# Patient Record
Sex: Female | Born: 1950 | Race: Black or African American | Hispanic: No | Smoking: Current every day smoker
Health system: Southern US, Community
[De-identification: ages and names within clinical notes are randomized; demographics above are authoritative.]

## PROBLEM LIST (undated history)

## (undated) DIAGNOSIS — I10 Essential (primary) hypertension: Secondary | ICD-10-CM

## (undated) DIAGNOSIS — E785 Hyperlipidemia, unspecified: Secondary | ICD-10-CM

## (undated) HISTORY — PX: TUBAL LIGATION: SHX77

## (undated) HISTORY — PX: TONSILLECTOMY: SUR1361

## (undated) HISTORY — PX: HERNIA REPAIR: SHX51

## (undated) HISTORY — PX: APPENDECTOMY: SHX54

## (undated) HISTORY — PX: ABDOMINAL HYSTERECTOMY: SHX81

---

## 2000-06-05 ENCOUNTER — Encounter: Admission: RE | Admit: 2000-06-05 | Discharge: 2000-06-05 | Payer: Self-pay | Admitting: Family Medicine

## 2000-06-05 ENCOUNTER — Encounter: Payer: Self-pay | Admitting: Family Medicine

## 2000-07-17 ENCOUNTER — Ambulatory Visit (HOSPITAL_BASED_OUTPATIENT_CLINIC_OR_DEPARTMENT_OTHER): Admission: RE | Admit: 2000-07-17 | Discharge: 2000-07-17 | Payer: Self-pay | Admitting: Oral Surgery

## 2000-10-07 ENCOUNTER — Emergency Department (HOSPITAL_COMMUNITY): Admission: EM | Admit: 2000-10-07 | Discharge: 2000-10-07 | Payer: Self-pay | Admitting: Emergency Medicine

## 2000-11-16 ENCOUNTER — Emergency Department (HOSPITAL_COMMUNITY): Admission: EM | Admit: 2000-11-16 | Discharge: 2000-11-17 | Payer: Self-pay | Admitting: Emergency Medicine

## 2000-11-17 ENCOUNTER — Encounter: Payer: Self-pay | Admitting: Emergency Medicine

## 2001-03-13 ENCOUNTER — Encounter: Payer: Self-pay | Admitting: Family Medicine

## 2001-03-13 ENCOUNTER — Encounter: Admission: RE | Admit: 2001-03-13 | Discharge: 2001-03-13 | Payer: Self-pay | Admitting: Family Medicine

## 2005-01-17 ENCOUNTER — Observation Stay (HOSPITAL_COMMUNITY): Admission: RE | Admit: 2005-01-17 | Discharge: 2005-01-18 | Payer: Self-pay | Admitting: General Surgery

## 2008-06-22 ENCOUNTER — Encounter: Admission: RE | Admit: 2008-06-22 | Discharge: 2008-06-22 | Payer: Self-pay | Admitting: Gastroenterology

## 2008-08-26 ENCOUNTER — Emergency Department (HOSPITAL_COMMUNITY): Admission: EM | Admit: 2008-08-26 | Discharge: 2008-08-26 | Payer: Self-pay | Admitting: Emergency Medicine

## 2008-09-27 ENCOUNTER — Emergency Department (HOSPITAL_COMMUNITY): Admission: EM | Admit: 2008-09-27 | Discharge: 2008-09-27 | Payer: Self-pay | Admitting: Emergency Medicine

## 2008-10-29 ENCOUNTER — Emergency Department (HOSPITAL_COMMUNITY): Admission: EM | Admit: 2008-10-29 | Discharge: 2008-10-29 | Payer: Self-pay | Admitting: Emergency Medicine

## 2009-01-05 ENCOUNTER — Emergency Department (HOSPITAL_COMMUNITY): Admission: EM | Admit: 2009-01-05 | Discharge: 2009-01-06 | Payer: Self-pay | Admitting: Emergency Medicine

## 2010-01-11 ENCOUNTER — Inpatient Hospital Stay (HOSPITAL_COMMUNITY): Admission: EM | Admit: 2010-01-11 | Discharge: 2010-01-13 | Payer: Self-pay | Admitting: Emergency Medicine

## 2010-10-22 LAB — BASIC METABOLIC PANEL
BUN: 10 mg/dL (ref 6–23)
CO2: 28 mEq/L (ref 19–32)
CO2: 29 mEq/L (ref 19–32)
Chloride: 103 mEq/L (ref 96–112)
Chloride: 107 mEq/L (ref 96–112)
Creatinine, Ser: 1.09 mg/dL (ref 0.4–1.2)
GFR calc Af Amer: >60 mL/min (ref >60)
Potassium: 3.4 mEq/L — ABNORMAL LOW (ref 3.5–5.1)
Potassium: 4.7 mEq/L (ref 3.5–5.1)
Sodium: 140 mEq/L (ref 135–145)

## 2010-10-22 LAB — URINALYSIS, ROUTINE W REFLEX MICROSCOPIC
Glucose, UA: NEGATIVE mg/dL
Ketones, ur: NEGATIVE mg/dL
Nitrite: NEGATIVE
Protein, ur: NEGATIVE mg/dL
Urobilinogen, UA: 1 mg/dL (ref 0.0–1.0)

## 2010-10-22 LAB — POCT I-STAT, CHEM 8
Calcium, Ion: 1.09 mmol/L — ABNORMAL LOW (ref 1.12–1.32)
Creatinine, Ser: 1 mg/dL (ref 0.4–1.2)
Hemoglobin: 16.7 g/dL — ABNORMAL HIGH (ref 12.0–15.0)
Sodium: 137 mEq/L (ref 135–145)
TCO2: 25 mmol/L (ref 0–100)

## 2010-10-22 LAB — LIPID PANEL
Cholesterol: 223 mg/dL — ABNORMAL HIGH (ref 0–200)
HDL: 48 mg/dL (ref >39)
HDL: 52 mg/dL (ref >39)
LDL Cholesterol: 159 mg/dL — ABNORMAL HIGH (ref 0–99)
LDL Cholesterol: 189 mg/dL — ABNORMAL HIGH (ref 0–99)
Total CHOL/HDL Ratio: 4.6 RATIO
Total CHOL/HDL Ratio: 4.9 RATIO
Triglycerides: 62 mg/dL (ref <150)
VLDL: 12 mg/dL (ref 0–40)
VLDL: 16 mg/dL (ref 0–40)

## 2010-10-22 LAB — CARDIAC PANEL(CRET KIN+CKTOT+MB+TROPI)
CK, MB: 0.6 ng/mL (ref 0.3–4.0)
CK, MB: 0.6 ng/mL (ref 0.3–4.0)
CK, MB: 0.6 ng/mL (ref 0.3–4.0)
Troponin I: 0.04 ng/mL (ref 0.00–0.06)
Troponin I: 0.04 ng/mL (ref 0.00–0.06)

## 2010-10-22 LAB — URINE CULTURE: Colony Count: 60000

## 2010-10-22 LAB — DIFFERENTIAL
Lymphocytes Relative: 38 % (ref 12–46)
Lymphs Abs: 2.5 10*3/uL (ref 0.7–4.0)
Monocytes Relative: 11 % (ref 3–12)
Neutrophils Relative %: 48 % (ref 43–77)

## 2010-10-22 LAB — CBC
HCT: 39.2 % (ref 36.0–46.0)
MCV: 87.3 fL (ref 78.0–100.0)
Platelets: 244 10*3/uL (ref 150–400)
RBC: 4.49 MIL/uL (ref 3.87–5.11)
WBC: 6.6 10*3/uL (ref 4.0–10.5)

## 2010-10-22 LAB — RAPID URINE DRUG SCREEN, HOSP PERFORMED
Barbiturates: NOT DETECTED
Benzodiazepines: NOT DETECTED

## 2010-10-22 LAB — CK TOTAL AND CKMB (NOT AT ARMC)
Relative Index: INVALID (ref 0.0–2.5)
Total CK: 59 U/L (ref 7–177)

## 2010-10-22 LAB — POCT CARDIAC MARKERS: Myoglobin, poc: 82.5 ng/mL (ref 12–200)

## 2010-10-22 LAB — TROPONIN I: Troponin I: 0.01 ng/mL (ref 0.00–0.06)

## 2010-10-22 LAB — PROTIME-INR: Prothrombin Time: 13.9 seconds (ref 11.6–15.2)

## 2010-11-12 LAB — CBC
HCT: 37.6 % (ref 36.0–46.0)
Hemoglobin: 12.4 g/dL (ref 12.0–15.0)
MCHC: 32.9 g/dL (ref 30.0–36.0)
MCV: 85.5 fL (ref 78.0–100.0)
RBC: 4.4 MIL/uL (ref 3.87–5.11)

## 2010-11-12 LAB — POCT CARDIAC MARKERS: Troponin i, poc: <0.05 ng/mL (ref 0.00–0.09)

## 2010-11-12 LAB — BRAIN NATRIURETIC PEPTIDE: Pro B Natriuretic peptide (BNP): 45 pg/mL (ref 0.0–100.0)

## 2010-11-12 LAB — DIFFERENTIAL
Basophils Absolute: 0 10*3/uL (ref 0.0–0.1)
Basophils Relative: 0 % (ref 0–1)
Eosinophils Absolute: 0.1 10*3/uL (ref 0.0–0.7)
Neutro Abs: 5.9 10*3/uL (ref 1.7–7.7)
Neutrophils Relative %: 69 % (ref 43–77)

## 2010-11-12 LAB — BASIC METABOLIC PANEL
CO2: 26 mEq/L (ref 19–32)
Calcium: 9 mg/dL (ref 8.4–10.5)
Chloride: 109 mEq/L (ref 96–112)
GFR calc Af Amer: >60 mL/min (ref >60)
Potassium: 3.2 mEq/L — ABNORMAL LOW (ref 3.5–5.1)
Sodium: 139 mEq/L (ref 135–145)

## 2010-11-15 LAB — POCT I-STAT, CHEM 8
BUN: 10 mg/dL (ref 6–23)
Chloride: 103 mEq/L (ref 96–112)
Potassium: 3.6 mEq/L (ref 3.5–5.1)
Sodium: 133 mEq/L — ABNORMAL LOW (ref 135–145)
TCO2: 24 mmol/L (ref 0–100)

## 2010-11-19 LAB — POCT I-STAT, CHEM 8
BUN: 8 mg/dL (ref 6–23)
Chloride: 105 mEq/L (ref 96–112)
Creatinine, Ser: 0.8 mg/dL (ref 0.4–1.2)
Hemoglobin: 13.9 g/dL (ref 12.0–15.0)
Potassium: 3.7 mEq/L (ref 3.5–5.1)
Sodium: 141 mEq/L (ref 135–145)

## 2010-11-20 LAB — URINALYSIS, ROUTINE W REFLEX MICROSCOPIC
Bilirubin Urine: NEGATIVE
Glucose, UA: NEGATIVE mg/dL
Hgb urine dipstick: NEGATIVE
Ketones, ur: NEGATIVE mg/dL
Nitrite: NEGATIVE
Specific Gravity, Urine: 1.01 (ref 1.005–1.030)
pH: 6 (ref 5.0–8.0)

## 2010-11-20 LAB — COMPREHENSIVE METABOLIC PANEL
ALT: 16 U/L (ref 0–35)
Alkaline Phosphatase: 93 U/L (ref 39–117)
CO2: 27 mEq/L (ref 19–32)
Calcium: 9.5 mg/dL (ref 8.4–10.5)
GFR calc non Af Amer: >60 mL/min (ref >60)
Glucose, Bld: 79 mg/dL (ref 70–99)
Sodium: 141 mEq/L (ref 135–145)

## 2010-11-20 LAB — DIFFERENTIAL
Basophils Absolute: 0.1 10*3/uL (ref 0.0–0.1)
Eosinophils Relative: 2 % (ref 0–5)
Neutrophils Relative %: 60 % (ref 43–77)

## 2010-11-20 LAB — CBC
Hemoglobin: 11.7 g/dL — ABNORMAL LOW (ref 12.0–15.0)
MCHC: 33.7 g/dL (ref 30.0–36.0)
RBC: 4.11 MIL/uL (ref 3.87–5.11)

## 2010-12-21 NOTE — Op Note (Signed)
Stephanie Benton, Stephanie Benton                ACCOUNT NO.:  [id number]   MEDICAL RECORD NO.:  [id number]          PATIENT TYPE:  AMB   LOCATION:  DAY                          FACILITY:  [facility]   PHYSICIAN:  Leonie Man, M.D.   DATE OF BIRTH:  [birth year]   DATE OF PROCEDURE:  01/17/2005  DATE OF DISCHARGE:                                 OPERATIVE REPORT   PREOPERATIVE DIAGNOSIS:  Incarcerated ventral hernia   POSTOPERATIVE DIAGNOSIS:  Incarcerated ventral hernia.   PROCEDURE:  Laparoscopic repair of incarcerated ventral hernia with the  mesh.   SURGEON:  Leonie Man, M.D.   ASSISTANT:  Ollen Gross. Carolynne Edouard, M.D.   ANESTHESIA:  General.   MESH USED:  Parietex dual mesh.   SPECIMENS:  None.   ESTIMATED BLOOD LOSS:  Minimal.   COMPLICATIONS:  None apparent. The patient returned to the ICU in good  condition.   HISTORY:  Note, the patient is a morbidly obese 60 year old female with a  large incarcerated ventral hernia in the epigastrium. Her previous  operations have been total abdominal hysterectomy, tubal ligation and  appendix. She comes to the operating room now because of persistent pain in  this tightly incarcerated hernia. She understands the risks and potential  benefits of surgery and gives consent.   DESCRIPTION OF PROCEDURE:  The patient is positioned supinely after the  induction of general anesthesia. The abdomen is prepped and draped to be  included in the sterile operative field. Open laparoscopy is carried out in  the midline in the lower abdomen with insertion of a Hassan cannula and the  insufflation of the peritoneal cavity to 14 mmHg with carbon dioxide.  Lateral ports were placed in the flanks using 5 mm trocars were inserted  under direct vision. A large incarcerated mass of omentum was then carefully  dissected free from the hernial defect using both electrocautery and the  harmonic scalpel. The margins of the defect were outlined and mesh extending  to  approximately 3 cm in excess of the of the margins was fashioned and  placed into the abdomen. Sutures through the mesh were brought up to the  abdominal wall through multiple small incisions around the defect and these  were sutured in place. The screw tacker was then used to secure the mesh to  the abdominal wall in two circular rows. The repair was noted to be intact.  Pneumoperitoneum was deflated after the trocars were removed under direct  vision. All areas of dissection were checked for hemostasis and noted to be  dry. The abdominal incisions was closed with  #0 Vicryl and 4-0 Monocryl. The other incisions were closed with staples.  Dressings were applied, anesthetic reversed and the patient removed from the  operating room to the recovery room in stable condition. She tolerated the  procedure well.       PB/MEDQ  D:  01/17/2005  T:  01/17/2005  Job:  [id number]   cc:   Renaye Rakers, M.D.  [phone] [address]  [place]  [place] [address]  Fax: [phone]

## 2011-06-27 IMAGING — CR DG CHEST 2V
2 series · 2 of 2 positions shown · non-contrast
Comparison: 01/05/2009

CLINICAL DATA: Chest pain.

CHEST - 2 VIEW

[w chest pa]
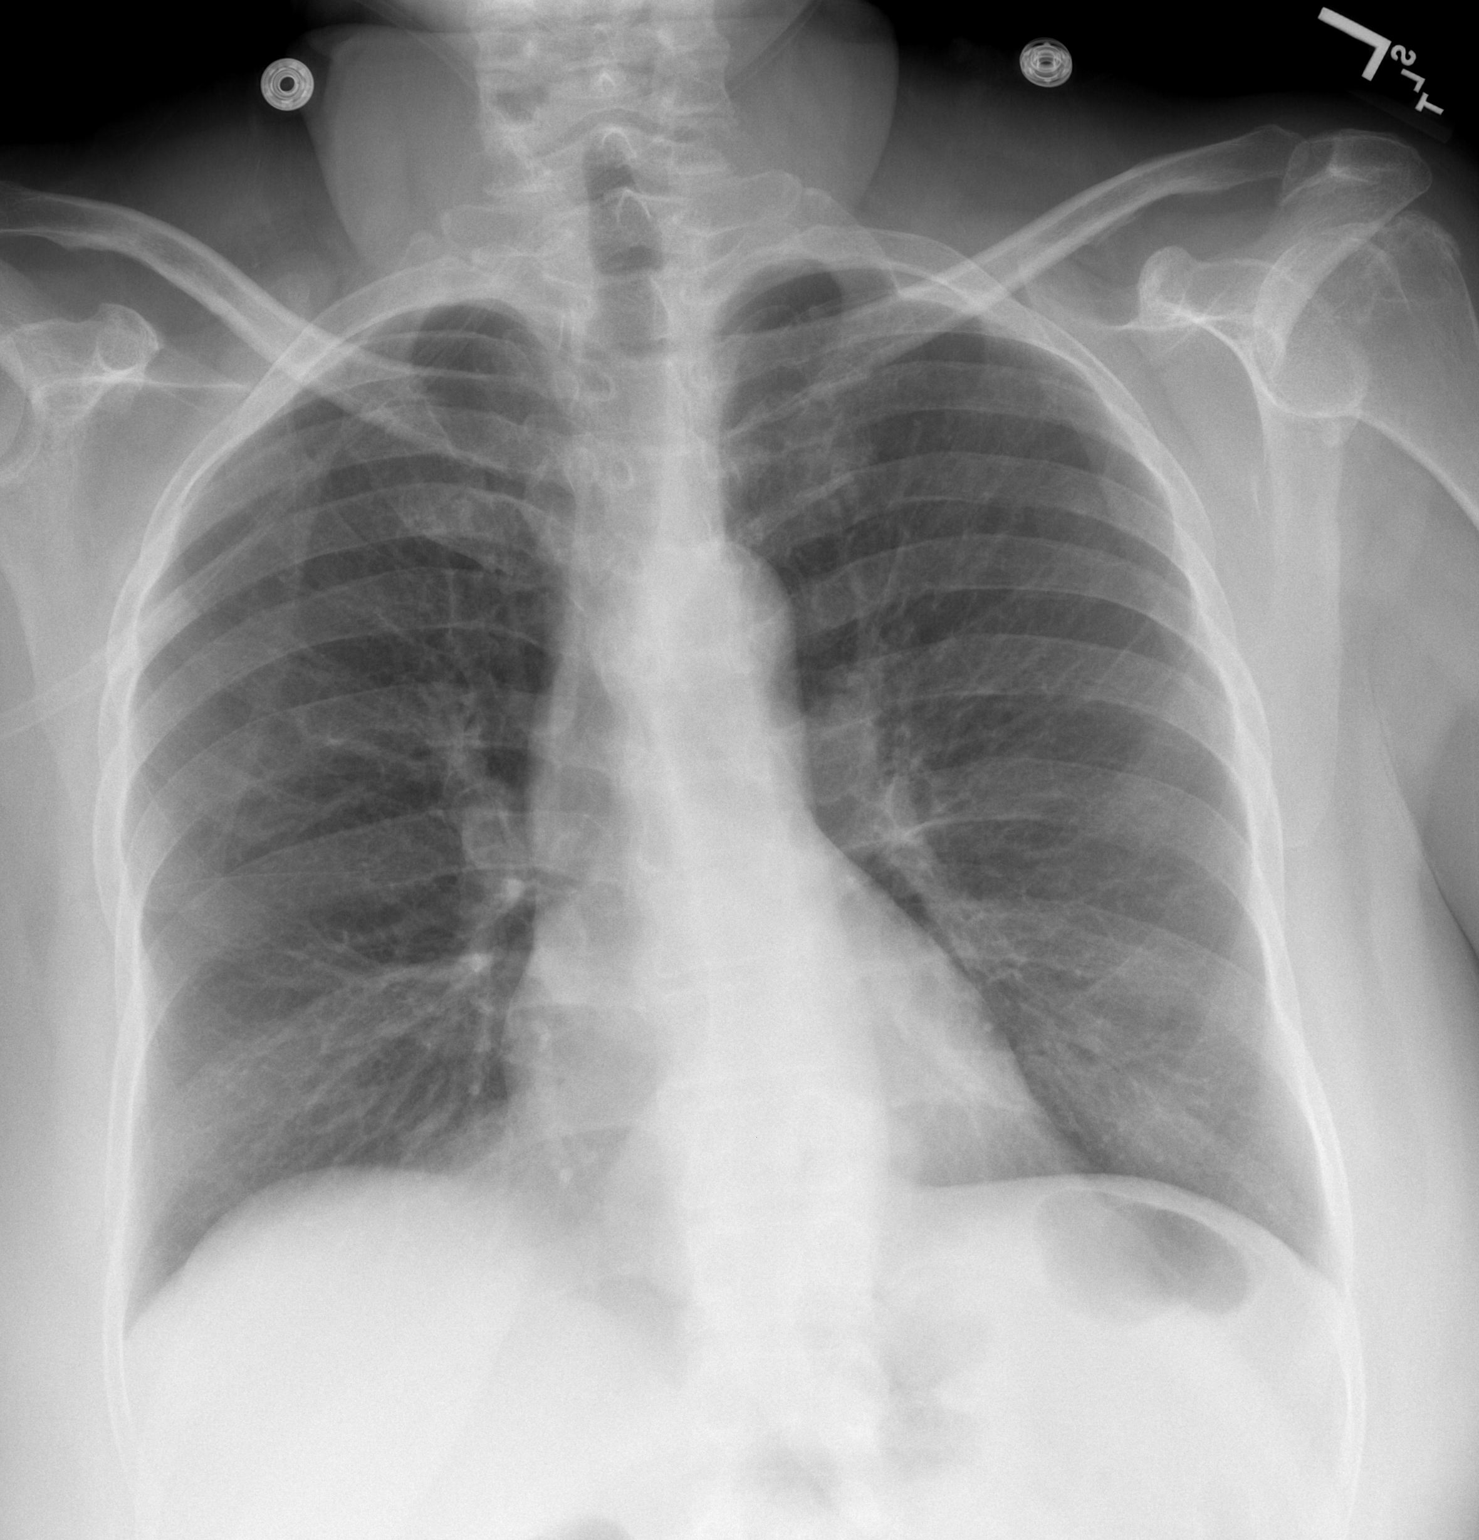

[w chest lat]
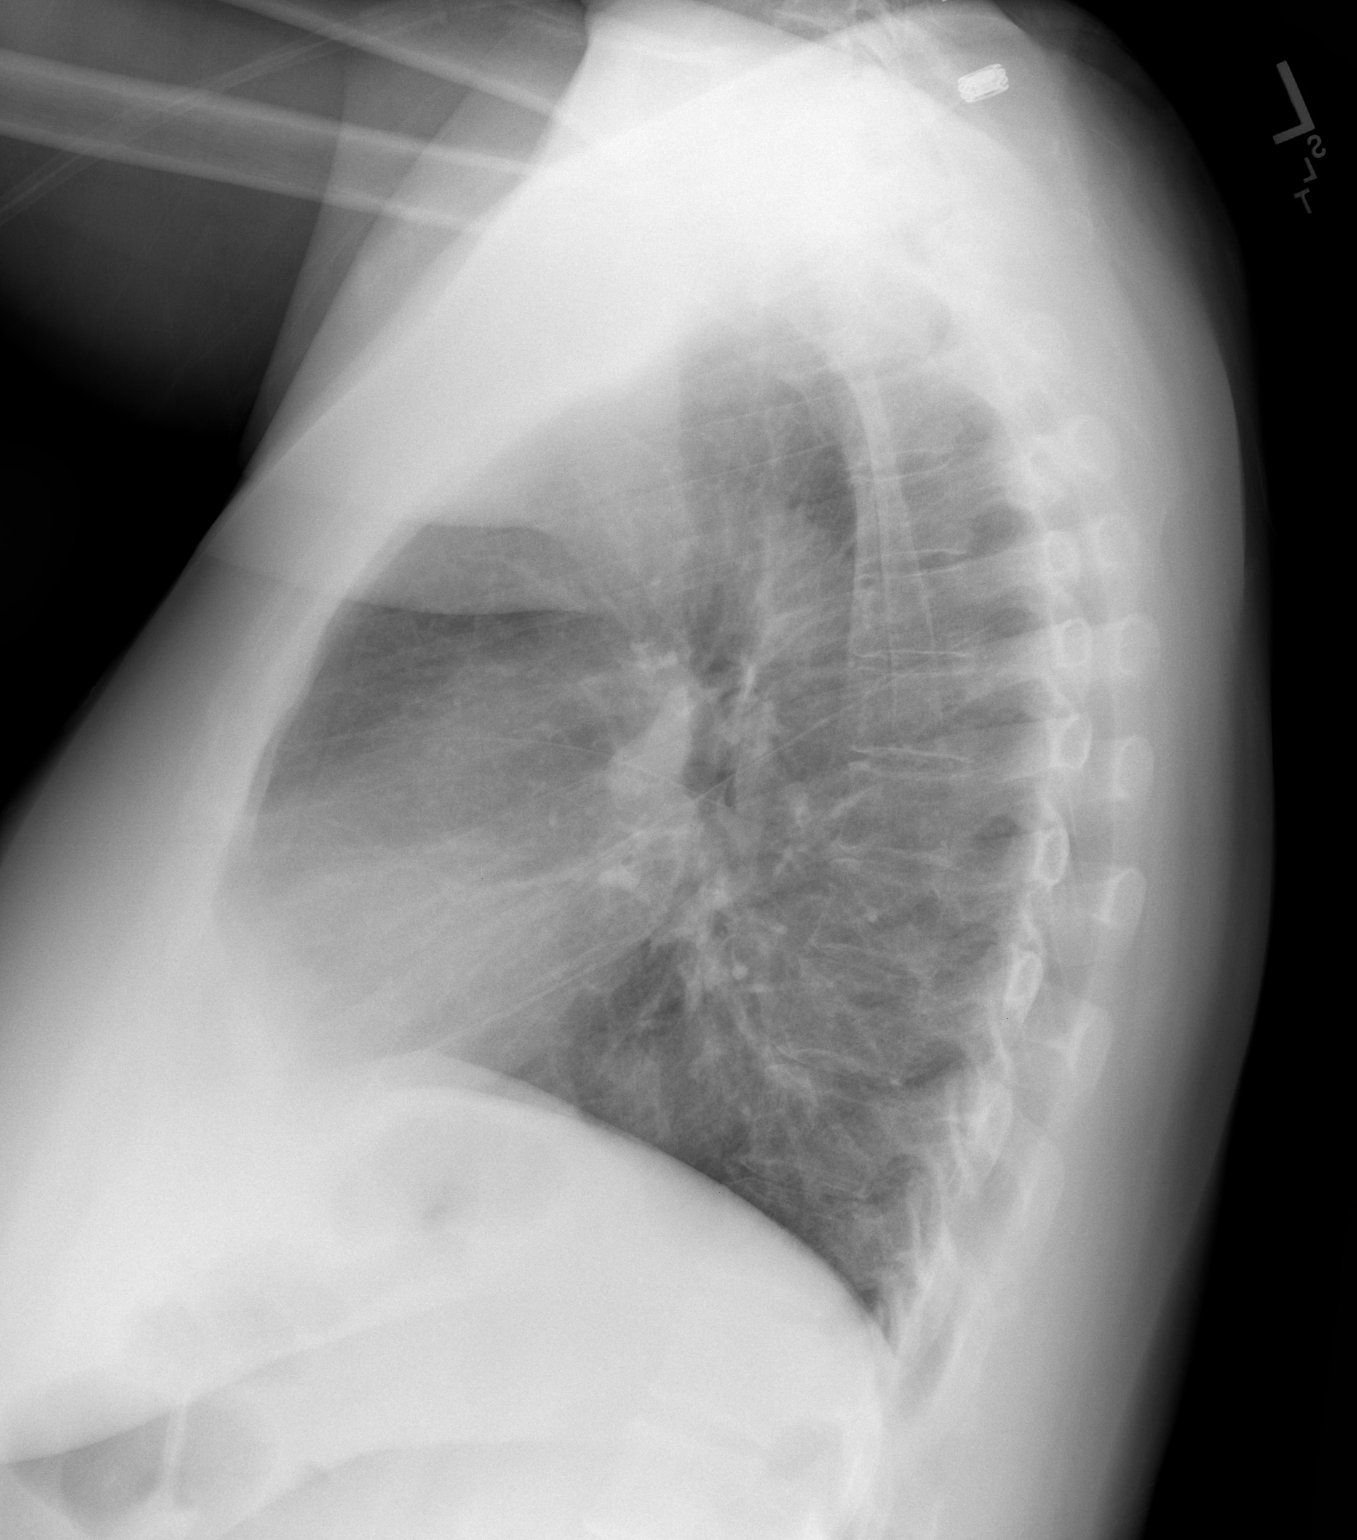

[2 of 2 positions shown; findings below may reference images not displayed]

FINDINGS: The cardiac silhouette, mediastinal and hilar contours
are within normal limits.  The lungs are clear.  There is a small
hiatal hernia.  The bony thorax is intact.
IMPRESSION: No acute cardiopulmonary findings.

## 2013-05-18 ENCOUNTER — Encounter (HOSPITAL_COMMUNITY): Payer: Self-pay | Admitting: Emergency Medicine

## 2013-05-18 ENCOUNTER — Emergency Department (HOSPITAL_COMMUNITY): Payer: Medicare Other

## 2013-05-18 ENCOUNTER — Emergency Department (HOSPITAL_COMMUNITY)
Admission: EM | Admit: 2013-05-18 | Discharge: 2013-05-18 | Disposition: A | Discharge status: Home / Self Care | Payer: Medicare Other | Attending: Emergency Medicine | Admitting: Emergency Medicine

## 2013-05-18 DIAGNOSIS — Z862 Personal history of diseases of the blood and blood-forming organs and certain disorders involving the immune mechanism: Secondary | ICD-10-CM | POA: Insufficient documentation

## 2013-05-18 DIAGNOSIS — J029 Acute pharyngitis, unspecified: Secondary | ICD-10-CM | POA: Insufficient documentation

## 2013-05-18 DIAGNOSIS — I10 Essential (primary) hypertension: Secondary | ICD-10-CM | POA: Insufficient documentation

## 2013-05-18 DIAGNOSIS — F17200 Nicotine dependence, unspecified, uncomplicated: Secondary | ICD-10-CM | POA: Insufficient documentation

## 2013-05-18 DIAGNOSIS — Z880 Allergy status to penicillin: Secondary | ICD-10-CM | POA: Insufficient documentation

## 2013-05-18 DIAGNOSIS — Z9089 Acquired absence of other organs: Secondary | ICD-10-CM | POA: Insufficient documentation

## 2013-05-18 DIAGNOSIS — Z8639 Personal history of other endocrine, nutritional and metabolic disease: Secondary | ICD-10-CM | POA: Insufficient documentation

## 2013-05-18 DIAGNOSIS — J441 Chronic obstructive pulmonary disease with (acute) exacerbation: Secondary | ICD-10-CM | POA: Diagnosis present

## 2013-05-18 DIAGNOSIS — R059 Cough, unspecified: Secondary | ICD-10-CM | POA: Insufficient documentation

## 2013-05-18 DIAGNOSIS — R05 Cough: Secondary | ICD-10-CM | POA: Insufficient documentation

## 2013-05-18 HISTORY — DX: Essential (primary) hypertension: I10

## 2013-05-18 HISTORY — DX: Hyperlipidemia, unspecified: E78.5

## 2013-05-18 MED ORDER — AZITHROMYCIN 250 MG PO TABS
500.0000 mg | ORAL_TABLET | Freq: Once | ORAL | Status: AC
  Administered 2013-05-18: 500 mg via ORAL
  Filled 2013-05-18: qty 2

## 2013-05-18 MED ORDER — ALBUTEROL SULFATE HFA 108 (90 BASE) MCG/ACT IN AERS
6.0000 | INHALATION_SPRAY | Freq: Once | RESPIRATORY_TRACT | Status: AC
  Administered 2013-05-18: 6 via RESPIRATORY_TRACT
  Filled 2013-05-18: qty 6.7

## 2013-05-18 MED ORDER — PREDNISONE 20 MG PO TABS
60.0000 mg | ORAL_TABLET | Freq: Once | ORAL | Status: AC
  Administered 2013-05-18: 60 mg via ORAL
  Filled 2013-05-18: qty 3

## 2013-05-18 MED ORDER — AZITHROMYCIN 250 MG PO TABS: ORAL_TABLET | ORAL | Status: DC

## 2013-05-18 MED ORDER — ALBUTEROL SULFATE (5 MG/ML) 0.5% IN NEBU
5.0000 mg | INHALATION_SOLUTION | Freq: Once | RESPIRATORY_TRACT | Status: AC
  Administered 2013-05-18: 5 mg via RESPIRATORY_TRACT
  Filled 2013-05-18: qty 1

## 2013-05-18 MED ORDER — GUAIFENESIN ER 600 MG PO TB12: 600.0000 mg | ORAL_TABLET | Freq: Two times a day (BID) | ORAL | Status: DC

## 2013-05-18 MED ORDER — PREDNISONE 50 MG PO TABS: ORAL_TABLET | ORAL | Status: DC

## 2013-05-18 MED ORDER — ALBUTEROL SULFATE HFA 108 (90 BASE) MCG/ACT IN AERS: 1.0000 | INHALATION_SPRAY | RESPIRATORY_TRACT | Status: DC | PRN

## 2013-05-18 NOTE — ED Notes (Signed)
Made triage RN aware of pt sats with vitals recheck.

## 2013-05-18 NOTE — ED Provider Notes (Signed)
CSN: [id number]     Arrival date & time 05/18/13  1751 History   First MD Initiated Contact with Patient 05/18/13 2131     Chief Complaint  Patient presents with  . Shortness of Breath  . Cough   (Consider location/radiation/quality/duration/timing/severity/associated sxs/prior Treatment) Patient is a 62 y.o. female presenting with shortness of breath and cough. The history is provided by the patient.  Shortness of Breath Severity:  Mild Onset quality:  Gradual Duration:  3 days Timing:  Constant Progression:  Unchanged Chronicity:  New Context: URI   Relieved by:  Nothing Worsened by:  Nothing tried Ineffective treatments:  None tried Associated symptoms: cough and sore throat   Associated symptoms: no abdominal pain, no chest pain, no fever, no headaches, no neck pain and no vomiting   Cough Associated symptoms: shortness of breath and sore throat   Associated symptoms: no chest pain, no fever and no headaches     Past Medical History  Diagnosis Date  . Hypertension   . Hyperlipidemia    Past Surgical History  Procedure Laterality Date  . Tonsillectomy    . Appendectomy    . Tubal ligation    . Abdominal hysterectomy    . Hernia repair     Family History  Problem Relation Age of Onset  . Heart failure Mother   . Cancer Father   . Cancer Sister    History  Substance Use Topics  . Smoking status: Current Every Day Smoker -- 0.75 packs/day    Types: Cigarettes  . Smokeless tobacco: Never Used  . Alcohol Use: No   OB History   Grav Para Term Preterm Abortions TAB SAB Ect Mult Living                 Review of Systems  Constitutional: Negative for fever and fatigue.  HENT: Positive for sore throat. Negative for congestion and drooling.   Eyes: Negative for pain.  Respiratory: Positive for cough and shortness of breath.   Cardiovascular: Negative for chest pain.  Gastrointestinal: Negative for nausea, vomiting, abdominal pain and diarrhea.  Genitourinary:  Negative for dysuria and hematuria.  Musculoskeletal: Negative for back pain, gait problem and neck pain.  Skin: Negative for color change.  Neurological: Negative for dizziness and headaches.  Hematological: Negative for adenopathy.  Psychiatric/Behavioral: Negative for behavioral problems.  All other systems reviewed and are negative.    Allergies  Codeine; Nsaids; Penicillins; and Sulfa antibiotics  Home Medications   Current Outpatient Rx  Name  Route  Sig  Dispense  Refill  . DEXILANT 60 MG capsule   Oral   Take 60 mg by mouth every evening.          Marland Kitchen LYRICA 100 MG capsule   Oral   Take 100 mg by mouth 2 (two) times daily.          Marland Kitchen PREMARIN 0.625 MG tablet   Oral   Take 0.625 mg by mouth every evening.          . risperiDONE (RISPERDAL) 0.5 MG tablet   Oral   Take 0.5 mg by mouth every evening.          . sertraline (ZOLOFT) 100 MG tablet   Oral   Take 50 mg by mouth every evening.          . traMADol (ULTRAM-ER) 100 MG 24 hr tablet   Oral   Take 100 mg by mouth daily.          Marland Kitchen  valsartan (DIOVAN) 160 MG tablet   Oral   Take 80 mg by mouth every evening.           BP 159/84  Pulse 65  Temp(Src) 99.2 F (37.3 C) (Oral)  Resp 20  Ht 5\' 5"  (1.651 m)  Wt 211 lb (95.709 kg)  BMI 35.11 kg/m2  SpO2 89% Physical Exam  Nursing note and vitals reviewed. Constitutional: She is oriented to person, place, and time. She appears well-developed and well-nourished.  HENT:  Head: Normocephalic.  Mouth/Throat: Oropharynx is clear and moist. No oropharyngeal exudate.  Eyes: Conjunctivae and EOM are normal. Pupils are equal, round, and reactive to light.  Neck: Normal range of motion. Neck supple.  Cardiovascular: Normal rate, regular rhythm, normal heart sounds and intact distal pulses.  Exam reveals no gallop and no friction rub.   No murmur heard. Pulmonary/Chest: Effort normal. No respiratory distress. She has wheezes (mild wheezing in lung  bases).  Abdominal: Soft. Bowel sounds are normal. There is no tenderness. There is no rebound and no guarding.  Musculoskeletal: Normal range of motion. She exhibits no edema and no tenderness.  Neurological: She is alert and oriented to person, place, and time.  Skin: Skin is warm and dry.  Psychiatric: She has a normal mood and affect. Her behavior is normal.    ED Course  Procedures (including critical care time) Labs Review Labs Reviewed - No data to display Imaging Review Dg Chest 2 View  05/18/2013   CLINICAL DATA:  Shortness of breath and cough  EXAM: CHEST  2 VIEW  COMPARISON:  01/11/2010  FINDINGS: The heart size and vascular pattern are normal. No pleural effusions identified. Mildly increased markings in the medial right lung base are identified which were not present previously. This is most likely due to atelectasis early findings of pneumonia cannot be excluded.  IMPRESSION: Mild right lower lobe opacity most likely due to atelectasis, although early changes of pneumonia are not excluded.   Electronically Signed   By: Esperanza Heir M.D.   On: 05/18/2013 18:30     MDM   1. COPD exacerbation    9:57 PM 62 y.o. female who presents with nonproductive cough and mild shortness of breath for the last 2-3 days. She denies any fevers at home. She is afebrile and vital signs are unremarkable here. She has mild wheezing bilaterally on exam. Chest x-ray showing mild right lower lobe opacity suspected to be due to atelectasis but cannot rule out early changes of pneumonia. Will treat for mild COPD exacerbation and pneumonia. Will give albuterol treatment, prednisone, azithromycin.     I have discussed the diagnosis/risks/treatment options with the patient and believe the pt to be eligible for discharge home to follow-up with pcp as needed. We also discussed returning to the ED immediately if new or worsening sx occur. We discussed the sx which are most concerning (e.g., worsening sob,  fever, cp) that necessitate immediate return. Any new prescriptions provided to the patient are listed below.  Discharge Medication List as of 05/18/2013 10:57 PM    START taking these medications   Details  albuterol (PROVENTIL HFA;VENTOLIN HFA) 108 (90 BASE) MCG/ACT inhaler Inhale 1-2 puffs into the lungs every 4 (four) hours as needed for wheezing or shortness of breath., Starting 05/18/2013, Until Discontinued, Print    azithromycin (ZITHROMAX) 250 MG tablet Take 1 tablet by mouth daily., Print    guaiFENesin (MUCINEX) 600 MG 12 hr tablet Take 1 tablet (600 mg total) by  mouth 2 (two) times daily., Starting 05/18/2013, Until Discontinued, Print    predniSONE (DELTASONE) 50 MG tablet Take 1 tablet by mouth daily for 4 days, Print           Junius Argyle, MD 05/19/13 1236

## 2013-05-18 NOTE — ED Notes (Signed)
Patient reports that she began having a sore throat 3 days ago and now has SOB and a non productive cough.

## 2014-01-31 ENCOUNTER — Encounter (HOSPITAL_COMMUNITY): Payer: Self-pay | Admitting: Emergency Medicine

## 2014-01-31 ENCOUNTER — Emergency Department (HOSPITAL_COMMUNITY)
Admission: EM | Admit: 2014-01-31 | Discharge: 2014-01-31 | Disposition: A | Discharge status: Home / Self Care | Payer: Medicare Other | Attending: Emergency Medicine | Admitting: Emergency Medicine

## 2014-01-31 DIAGNOSIS — F17200 Nicotine dependence, unspecified, uncomplicated: Secondary | ICD-10-CM | POA: Insufficient documentation

## 2014-01-31 DIAGNOSIS — Z862 Personal history of diseases of the blood and blood-forming organs and certain disorders involving the immune mechanism: Secondary | ICD-10-CM | POA: Insufficient documentation

## 2014-01-31 DIAGNOSIS — R110 Nausea: Secondary | ICD-10-CM | POA: Insufficient documentation

## 2014-01-31 DIAGNOSIS — I10 Essential (primary) hypertension: Secondary | ICD-10-CM | POA: Insufficient documentation

## 2014-01-31 DIAGNOSIS — Z8639 Personal history of other endocrine, nutritional and metabolic disease: Secondary | ICD-10-CM | POA: Insufficient documentation

## 2014-01-31 DIAGNOSIS — Z79899 Other long term (current) drug therapy: Secondary | ICD-10-CM | POA: Insufficient documentation

## 2014-01-31 DIAGNOSIS — Z880 Allergy status to penicillin: Secondary | ICD-10-CM | POA: Insufficient documentation

## 2014-01-31 DIAGNOSIS — R51 Headache: Secondary | ICD-10-CM | POA: Insufficient documentation

## 2014-01-31 DIAGNOSIS — R519 Headache, unspecified: Secondary | ICD-10-CM

## 2014-01-31 MED ORDER — METOCLOPRAMIDE HCL 5 MG/ML IJ SOLN
5.0000 mg | Freq: Once | INTRAMUSCULAR | Status: AC
  Administered 2014-01-31: 5 mg via INTRAVENOUS
  Filled 2014-01-31: qty 2

## 2014-01-31 MED ORDER — SODIUM CHLORIDE 0.9 % IV BOLUS (SEPSIS)
1000.0000 mL | INTRAVENOUS | Status: AC
  Administered 2014-01-31: 1000 mL via INTRAVENOUS

## 2014-01-31 MED ORDER — BUTALBITAL-APAP-CAFFEINE 50-325-40 MG PO TABS: 1.0000 | ORAL_TABLET | Freq: Four times a day (QID) | ORAL | Status: AC | PRN

## 2014-01-31 MED ORDER — DEXAMETHASONE SODIUM PHOSPHATE 10 MG/ML IJ SOLN
10.0000 mg | Freq: Once | INTRAMUSCULAR | Status: AC
  Administered 2014-01-31: 10 mg via INTRAVENOUS
  Filled 2014-01-31: qty 1

## 2014-01-31 MED ORDER — DIPHENHYDRAMINE HCL 50 MG/ML IJ SOLN
12.5000 mg | Freq: Once | INTRAMUSCULAR | Status: AC
  Administered 2014-01-31: 12.5 mg via INTRAVENOUS
  Filled 2014-01-31: qty 1

## 2014-01-31 NOTE — Discharge Instructions (Signed)

## 2014-01-31 NOTE — ED Notes (Signed)
MD at bedside. 

## 2014-01-31 NOTE — ED Provider Notes (Signed)
CSN: [id number]     Arrival date & time 01/31/14  1402 History   First MD Initiated Contact with Patient 01/31/14 1653     Chief Complaint  Patient presents with  . Headache     (Consider location/radiation/quality/duration/timing/severity/associated sxs/prior Treatment) Patient is a 63 y.o. female presenting with headaches. The history is provided by the patient. No language interpreter was used.  Headache Location: vertex. Quality: throbbing. Radiates to:  Does not radiate Severity currently:  9/10 Onset quality:  Gradual Duration:  5 days Timing:  Constant Progression:  Worsening Chronicity:  New Similar to prior headaches: no   Relieved by:  Nothing Ineffective treatments:  NSAIDs Associated symptoms: nausea   Associated symptoms: no fever, no neck stiffness, no photophobia and no vomiting     Past Medical History  Diagnosis Date  . Hypertension   . Hyperlipidemia    Past Surgical History  Procedure Laterality Date  . Tonsillectomy    . Appendectomy    . Tubal ligation    . Abdominal hysterectomy    . Hernia repair     Family History  Problem Relation Age of Onset  . Heart failure Mother   . Cancer Father   . Cancer Sister    History  Substance Use Topics  . Smoking status: Current Every Day Smoker -- 0.75 packs/day    Types: Cigarettes  . Smokeless tobacco: Never Used  . Alcohol Use: No   OB History   Grav Para Term Preterm Abortions TAB SAB Ect Mult Living                 Review of Systems  Constitutional: Negative for fever.  Eyes: Negative for photophobia.  Gastrointestinal: Positive for nausea. Negative for vomiting.  Musculoskeletal: Negative for neck stiffness.  Neurological: Positive for headaches.  All other systems reviewed and are negative.     Allergies  Codeine; Nsaids; Penicillins; and Sulfa antibiotics  Home Medications   Prior to Admission medications   Medication Sig Start Date End Date Taking? Authorizing Raymond Azure   albuterol (PROVENTIL HFA;VENTOLIN HFA) 108 (90 BASE) MCG/ACT inhaler Inhale 1-2 puffs into the lungs every 4 (four) hours as needed for wheezing or shortness of breath. 05/18/13  Yes Junius ArgyleForrest S Harrison, MD  DEXILANT 60 MG capsule Take 60 mg by mouth every evening.  05/05/13  Yes Historical Lovie Agresta, MD  LYRICA 100 MG capsule Take 100 mg by mouth 2 (two) times daily.  05/05/13  Yes Historical Emmaline Wahba, MD  PARoxetine (PAXIL) 10 MG tablet Take 10 mg by mouth daily.   Yes Historical Tenicia Gural, MD  risperiDONE (RISPERDAL) 0.5 MG tablet Take 0.5 mg by mouth every evening.  05/05/13  Yes Historical Shedric Fredericks, MD  sertraline (ZOLOFT) 100 MG tablet Take 50 mg by mouth every evening.  05/05/13  Yes Historical Zaul Hubers, MD  traMADol (ULTRAM) 50 MG tablet Take 50 mg by mouth 2 (two) times daily.   Yes Historical Bynum Mccullars, MD  valsartan (DIOVAN) 160 MG tablet Take 80 mg by mouth every evening.  05/05/13  Yes Historical Miraya Cudney, MD   BP 157/67  Pulse 77  Temp(Src) 99.2 F (37.3 C) (Oral)  Resp 18  SpO2 98% Physical Exam  Nursing note and vitals reviewed. Constitutional: She is oriented to person, place, and time. She appears well-developed and well-nourished. No distress.  HENT:  Head: Atraumatic.  Eyes: Conjunctivae are normal.  Neck: Normal range of motion. Neck supple.  No nuchal rigidity  Cardiovascular: Normal rate and regular rhythm.  Pulmonary/Chest: Effort normal and breath sounds normal. She has no wheezes. She exhibits no tenderness.  Abdominal: Soft. There is no tenderness.  Neurological: She is alert and oriented to person, place, and time.  Neurologic exam:  Speech clear, pupils equal round reactive to light, extraocular movements intact  Normal peripheral visual fields Cranial nerves III through XII normal including no facial droop Follows commands, moves all extremities x4, normal strength to bilateral upper and lower extremities at all major muscle groups including grip Sensation  normal to light touch  Coordination intact, no limb ataxia, finger-nose-finger normal Rapid alternating movements normal No pronator drift Gait normal   Skin: No rash noted.  Psychiatric: She has a normal mood and affect.    ED Course  Procedures (including critical care time)  5:57 PM Pt here with headache.  No sudden onset thunderclap headache concerning for SAH, no fever, or meningismal sign concerning for mengingitis, no focal neuro deficits concerning for stroke or mass.  However, pt doesn't normally has headache, therefore i recommend head ct scan to r/o malignancy causing headache but pt decided to opt out and prefers sxs treatment first.  Pt agrees with risk/benefit.  Pt agrees to return if sxs worsen or if she developed neuro deficits.  Pt able to make informed decision and currently in NAD.  Will consider fioricet as initial treatment.    8:35 PM Headache improves after migraine cocktail.  Pt stable for discharge. Return precaution discussed.  Care discussed with Dr. Romeo Apple.  Labs Review Labs Reviewed - No data to display  Imaging Review No results found.   EKG Interpretation None      MDM   Final diagnoses:  Nonintractable episodic headache, unspecified headache type    BP 156/64  Pulse 65  Temp(Src) 98.6 F (37 C) (Oral)  Resp 18  SpO2 98%     Fayrene Helper, PA-C 01/31/14 2036

## 2014-01-31 NOTE — ED Notes (Signed)
Pt states headache for past 4 days.  Recently started on zocor.  Pt states breathing is "irratic" also.

## 2014-02-01 NOTE — ED Provider Notes (Signed)
Medical screening examination/treatment/procedure(s) were conducted as a shared visit with non-physician practitioner(s) and myself.  I personally evaluated the patient during the encounter.   EKG Interpretation None      I interviewed and examined the patient. Lungs are CTAB. Cardiac exam wnl. Abdomen soft.  Pt here w/ non-concerning HA, but d/t old age and no hx of HA's I recommended CT. Should does not want this, but is ok w/ migraine cocktail. I think this is reasonable. Will tx symptomatically, will readdress imaging if no improvement.   Junius Argyle, MD 02/01/14 [phone]

## 2014-11-27 ENCOUNTER — Emergency Department (HOSPITAL_COMMUNITY)
Admission: EM | Admit: 2014-11-27 | Discharge: 2014-11-27 | Disposition: A | Discharge status: Home / Self Care | Payer: Medicare Other | Attending: Emergency Medicine | Admitting: Emergency Medicine

## 2014-11-27 ENCOUNTER — Emergency Department (HOSPITAL_COMMUNITY): Payer: Medicare Other

## 2014-11-27 ENCOUNTER — Encounter (HOSPITAL_COMMUNITY): Payer: Self-pay

## 2014-11-27 DIAGNOSIS — I10 Essential (primary) hypertension: Secondary | ICD-10-CM | POA: Diagnosis not present

## 2014-11-27 DIAGNOSIS — Z880 Allergy status to penicillin: Secondary | ICD-10-CM | POA: Insufficient documentation

## 2014-11-27 DIAGNOSIS — J4531 Mild persistent asthma with (acute) exacerbation: Secondary | ICD-10-CM | POA: Insufficient documentation

## 2014-11-27 DIAGNOSIS — R05 Cough: Secondary | ICD-10-CM | POA: Diagnosis not present

## 2014-11-27 DIAGNOSIS — Z8639 Personal history of other endocrine, nutritional and metabolic disease: Secondary | ICD-10-CM | POA: Insufficient documentation

## 2014-11-27 DIAGNOSIS — Z79899 Other long term (current) drug therapy: Secondary | ICD-10-CM | POA: Diagnosis not present

## 2014-11-27 DIAGNOSIS — Z720 Tobacco use: Secondary | ICD-10-CM | POA: Diagnosis not present

## 2014-11-27 DIAGNOSIS — R0602 Shortness of breath: Secondary | ICD-10-CM | POA: Diagnosis present

## 2014-11-27 MED ORDER — ALBUTEROL SULFATE (2.5 MG/3ML) 0.083% IN NEBU: 2.5000 mg | INHALATION_SOLUTION | Freq: Four times a day (QID) | RESPIRATORY_TRACT | Status: DC | PRN

## 2014-11-27 MED ORDER — PREDNISONE 20 MG PO TABS
60.0000 mg | ORAL_TABLET | Freq: Every day | ORAL | Status: DC
  Administered 2014-11-27: 60 mg via ORAL
  Filled 2014-11-27: qty 3

## 2014-11-27 MED ORDER — ALBUTEROL SULFATE (2.5 MG/3ML) 0.083% IN NEBU
2.5000 mg | INHALATION_SOLUTION | Freq: Once | RESPIRATORY_TRACT | Status: AC
  Administered 2014-11-27: 2.5 mg via RESPIRATORY_TRACT
  Filled 2014-11-27: qty 3

## 2014-11-27 MED ORDER — PREDNISONE 10 MG PO TABS: ORAL_TABLET | ORAL | Status: DC

## 2014-11-27 MED ORDER — IPRATROPIUM BROMIDE 0.02 % IN SOLN
0.5000 mg | Freq: Once | RESPIRATORY_TRACT | Status: AC
  Administered 2014-11-27: 0.5 mg via RESPIRATORY_TRACT
  Filled 2014-11-27: qty 2.5

## 2014-11-27 NOTE — ED Provider Notes (Signed)
CSN: [id number]     Arrival date & time 11/27/14  1227 History  This chart was scribed for non-physician practitioner, Elson Areas, PA-C, working with Cathren Laine, MD, by Roxy Cedar ED Scribe. This patient was seen in room WTR9/WTR9 and the patient's care was started at 3:06 PM    Chief Complaint  Patient presents with  . Shortness of Breath   Patient is a 64 y.o. female presenting with shortness of breath. The history is provided by the patient. No language interpreter was used.  Shortness of Breath Severity:  Moderate Onset quality:  Gradual Duration:  1 week Timing:  Constant Progression:  Waxing and waning Chronicity:  New Context: smoke exposure   Relieved by:  Nothing Worsened by:  Nothing tried Ineffective treatments:  Inhaler Associated symptoms: no fever    HPI Comments: Stephanie Benton is a 64 y.o. female with a PMHx of hypertension, hyperlipidemia, and abdominal hysterectomy, who presents to the Emergency Department complaining of moderate shortness of breath onset 1 week ago. Patient states that she takes Zyrtec and inhaler with no relief. She denies associated fever. Patient denies hx of asthma. Patient states that she has a breathing machine at home but was never prescribed the medication. Patient is a smoker. She reports family hx of asthma and bronchitis. Patient states that she has taken prednisone in the past for similar symptoms.   Past Medical History  Diagnosis Date  . Hypertension   . Hyperlipidemia    Past Surgical History  Procedure Laterality Date  . Tonsillectomy    . Appendectomy    . Tubal ligation    . Abdominal hysterectomy    . Hernia repair     Family History  Problem Relation Age of Onset  . Heart failure Mother   . Cancer Father   . Cancer Sister    History  Substance Use Topics  . Smoking status: Current Every Day Smoker -- 0.75 packs/day    Types: Cigarettes  . Smokeless tobacco: Never Used  . Alcohol Use: No   OB History     No data available     Review of Systems  Constitutional: Negative for fever.  Respiratory: Positive for shortness of breath.   All other systems reviewed and are negative.  Allergies  Codeine; Nsaids; Penicillins; and Sulfa antibiotics  Home Medications   Prior to Admission medications   Medication Sig Start Date End Date Taking? Authorizing Provider  albuterol (PROVENTIL HFA;VENTOLIN HFA) 108 (90 BASE) MCG/ACT inhaler Inhale 1-2 puffs into the lungs every 4 (four) hours as needed for wheezing or shortness of breath. 05/18/13  Yes Purvis Sheffield, MD  DEXILANT 60 MG capsule Take 60 mg by mouth every evening.  05/05/13  Yes Historical Provider, MD  LYRICA 100 MG capsule Take 100 mg by mouth 2 (two) times daily.  05/05/13  Yes Historical Provider, MD  risperiDONE (RISPERDAL) 1 MG tablet Take 1 mg by mouth daily. 11/24/14  Yes Historical Provider, MD  sertraline (ZOLOFT) 100 MG tablet Take 100 mg by mouth every evening.  05/05/13  Yes Historical Provider, MD  traMADol (ULTRAM) 50 MG tablet Take 50 mg by mouth 2 (two) times daily.   Yes Historical Provider, MD  valsartan (DIOVAN) 160 MG tablet Take 80 mg by mouth every evening.  05/05/13  Yes Historical Provider, MD  butalbital-acetaminophen-caffeine (FIORICET) 50-325-40 MG per tablet Take 1-2 tablets by mouth every 6 (six) hours as needed for headache. Patient not taking: Reported on 11/27/2014 01/31/14 01/31/15  Fayrene Helper, PA-C   Triage Vitals: BP 135/74 mmHg  Pulse 65  Temp(Src) 98.3 F (36.8 C) (Oral)  Resp 16  SpO2 95%  Physical Exam  Constitutional: She appears well-developed and well-nourished.  HENT:  Head: Normocephalic and atraumatic.  Eyes: Conjunctivae are normal. Right eye exhibits no discharge. Left eye exhibits no discharge.  Pulmonary/Chest: Effort normal. No respiratory distress.  Neurological: She is alert. Coordination normal.  Skin: Skin is warm and dry. No rash noted. She is not diaphoretic. No erythema.   Psychiatric: She has a normal mood and affect.  Nursing note and vitals reviewed.  ED Course  Procedures (including critical care time)  DIAGNOSTIC STUDIES: Oxygen Saturation is 95% on RA, normal by my interpretation.    COORDINATION OF CARE: 3:06 PM- Ordered diagnostic CXR. Gave patient albuterol breathing treatment. Pt advised of plan for treatment and pt agrees.  3:10 PM- Discussed plans to give patient prednisone and albuterol breathing treatment. Discussed plans to discharge.  Labs Review Labs Reviewed - No data to display  Imaging Review Dg Chest 2 View  11/27/2014   CLINICAL DATA:  Short of breath and cough  EXAM: CHEST  2 VIEW  COMPARISON:  05/18/2013  FINDINGS: Heart size normal. Negative for heart failure. Lungs are clear without infiltrate or effusion. Hiatal hernia.  IMPRESSION: No active cardiopulmonary disease.   Electronically Signed   By: Marlan Palau M.D.   On: 11/27/2014 15:35     EKG Interpretation None     MDM  Pt sounds better after breathing treatment   Final diagnoses:  Asthma, mild persistent, with acute exacerbation   I personally performed the services in this documentation, which was scribed in my presence.  The recorded information has been reviewed and considered.   Barnet Pall.   prednsine Albuterol neb  Return if any problems.  Elson Areas, PA-C 11/27/14 1550  [address] [place], PA-C 11/27/14 1554  Bethann Berkshire, MD 11/27/14 2329

## 2014-11-27 NOTE — Discharge Instructions (Signed)

## 2014-11-27 NOTE — ED Notes (Signed)
She reports shortness of breath with uri sx x 1 week.  She uses her inhaler, which helps sometimes "but last night when I used it it didn't help".  She is in no distress.

## 2015-04-13 DIAGNOSIS — Z131 Encounter for screening for diabetes mellitus: Secondary | ICD-10-CM | POA: Diagnosis not present

## 2015-04-13 DIAGNOSIS — N951 Menopausal and female climacteric states: Secondary | ICD-10-CM | POA: Diagnosis not present

## 2015-04-13 DIAGNOSIS — M5136 Other intervertebral disc degeneration, lumbar region: Secondary | ICD-10-CM | POA: Diagnosis not present

## 2015-04-13 DIAGNOSIS — E559 Vitamin D deficiency, unspecified: Secondary | ICD-10-CM | POA: Diagnosis not present

## 2015-04-13 DIAGNOSIS — F17210 Nicotine dependence, cigarettes, uncomplicated: Secondary | ICD-10-CM | POA: Diagnosis not present

## 2015-04-13 DIAGNOSIS — E784 Other hyperlipidemia: Secondary | ICD-10-CM | POA: Diagnosis not present

## 2015-04-13 DIAGNOSIS — I10 Essential (primary) hypertension: Secondary | ICD-10-CM | POA: Diagnosis not present

## 2015-06-05 ENCOUNTER — Emergency Department (HOSPITAL_COMMUNITY): Payer: Medicare Other

## 2015-06-05 ENCOUNTER — Encounter (HOSPITAL_COMMUNITY): Payer: Self-pay | Admitting: *Deleted

## 2015-06-05 ENCOUNTER — Emergency Department (HOSPITAL_COMMUNITY)
Admission: EM | Admit: 2015-06-05 | Discharge: 2015-06-05 | Disposition: A | Discharge status: Home / Self Care | Payer: Medicare Other | Attending: Emergency Medicine | Admitting: Emergency Medicine

## 2015-06-05 DIAGNOSIS — E785 Hyperlipidemia, unspecified: Secondary | ICD-10-CM | POA: Insufficient documentation

## 2015-06-05 DIAGNOSIS — F17210 Nicotine dependence, cigarettes, uncomplicated: Secondary | ICD-10-CM | POA: Diagnosis not present

## 2015-06-05 DIAGNOSIS — Z720 Tobacco use: Secondary | ICD-10-CM | POA: Diagnosis not present

## 2015-06-05 DIAGNOSIS — Z880 Allergy status to penicillin: Secondary | ICD-10-CM | POA: Insufficient documentation

## 2015-06-05 DIAGNOSIS — I10 Essential (primary) hypertension: Secondary | ICD-10-CM | POA: Insufficient documentation

## 2015-06-05 DIAGNOSIS — Z79899 Other long term (current) drug therapy: Secondary | ICD-10-CM | POA: Diagnosis not present

## 2015-06-05 DIAGNOSIS — J441 Chronic obstructive pulmonary disease with (acute) exacerbation: Secondary | ICD-10-CM | POA: Diagnosis not present

## 2015-06-05 DIAGNOSIS — J45901 Unspecified asthma with (acute) exacerbation: Secondary | ICD-10-CM

## 2015-06-05 DIAGNOSIS — R0602 Shortness of breath: Secondary | ICD-10-CM | POA: Diagnosis present

## 2015-06-05 DIAGNOSIS — R05 Cough: Secondary | ICD-10-CM | POA: Diagnosis not present

## 2015-06-05 DIAGNOSIS — J45909 Unspecified asthma, uncomplicated: Secondary | ICD-10-CM | POA: Diagnosis not present

## 2015-06-05 LAB — BASIC METABOLIC PANEL
Anion gap: 6 (ref 5–15)
BUN: 9 mg/dL (ref 6–20)
CALCIUM: 9 mg/dL (ref 8.9–10.3)
CO2: 24 mmol/L (ref 22–32)
CREATININE: 0.92 mg/dL (ref 0.44–1.00)
Chloride: 112 mmol/L — ABNORMAL HIGH (ref 101–111)
Glucose, Bld: 85 mg/dL (ref 65–99)
Potassium: 3.5 mmol/L (ref 3.5–5.1)
SODIUM: 142 mmol/L (ref 135–145)

## 2015-06-05 LAB — CBC
HCT: 39.6 % (ref 36.0–46.0)
Hemoglobin: 13.1 g/dL (ref 12.0–15.0)
MCH: 31.5 pg (ref 26.0–34.0)
MCHC: 33.1 g/dL (ref 30.0–36.0)
MCV: 95.2 fL (ref 78.0–100.0)
PLATELETS: 236 10*3/uL (ref 150–400)
RBC: 4.16 MIL/uL (ref 3.87–5.11)
RDW: 13.1 % (ref 11.5–15.5)
WBC: 7.6 10*3/uL (ref 4.0–10.5)

## 2015-06-05 LAB — I-STAT TROPONIN, ED: TROPONIN I, POC: 0 ng/mL (ref 0.00–0.08)

## 2015-06-05 MED ORDER — ONDANSETRON 4 MG PO TBDP
4.0000 mg | ORAL_TABLET | Freq: Once | ORAL | Status: DC | PRN
  Administered 2015-06-05: 4 mg via ORAL
  Filled 2015-06-05: qty 1

## 2015-06-05 MED ORDER — ALBUTEROL SULFATE (2.5 MG/3ML) 0.083% IN NEBU
5.0000 mg | INHALATION_SOLUTION | Freq: Once | RESPIRATORY_TRACT | Status: AC
  Administered 2015-06-05: 5 mg via RESPIRATORY_TRACT
  Filled 2015-06-05: qty 6

## 2015-06-05 MED ORDER — MORPHINE SULFATE (PF) 4 MG/ML IV SOLN
4.0000 mg | Freq: Once | INTRAVENOUS | Status: AC
  Administered 2015-06-05: 4 mg via INTRAMUSCULAR
  Filled 2015-06-05: qty 1

## 2015-06-05 MED ORDER — PREDNISONE 20 MG PO TABS
60.0000 mg | ORAL_TABLET | Freq: Once | ORAL | Status: AC
  Administered 2015-06-05: 60 mg via ORAL
  Filled 2015-06-05: qty 3

## 2015-06-05 MED ORDER — IPRATROPIUM BROMIDE 0.02 % IN SOLN
0.5000 mg | Freq: Once | RESPIRATORY_TRACT | Status: AC
  Administered 2015-06-05: 0.5 mg via RESPIRATORY_TRACT
  Filled 2015-06-05: qty 2.5

## 2015-06-05 NOTE — Discharge Instructions (Signed)
You have been seen today for chest tightness. Your imaging and lab tests showed no abnormalities. Follow up with PCP as needed. Return to ED should symptoms worsen.   Asthma, Adult Asthma is a recurring condition in which the airways tighten and narrow. Asthma can make it difficult to breathe. It can cause coughing, wheezing, and shortness of breath. Asthma episodes, also called asthma attacks, range from minor to life-threatening. Asthma cannot be cured, but medicines and lifestyle changes can help control it. CAUSES Asthma is believed to be caused by inherited (genetic) and environmental factors, but its exact cause is unknown. Asthma may be triggered by allergens, lung infections, or irritants in the air. Asthma triggers are different for each person. Common triggers include:   Animal dander.  Dust mites.  Cockroaches.  Pollen from trees or grass.  Mold.  Smoke.  Air pollutants such as dust, household cleaners, hair sprays, aerosol sprays, paint fumes, strong chemicals, or strong odors.  Cold air, weather changes, and winds (which increase molds and pollens in the air).  Strong emotional expressions such as crying or laughing hard.  Stress.  Certain medicines (such as aspirin) or types of drugs (such as beta-blockers).  Sulfites in foods and drinks. Foods and drinks that may contain sulfites include dried fruit, potato chips, and sparkling grape juice.  Infections or inflammatory conditions such as the flu, a cold, or an inflammation of the nasal membranes (rhinitis).  Gastroesophageal reflux disease (GERD).  Exercise or strenuous activity. SYMPTOMS Symptoms may occur immediately after asthma is triggered or many hours later. Symptoms include:  Wheezing.  Excessive nighttime or early morning coughing.  Frequent or severe coughing with a common cold.  Chest tightness.  Shortness of breath. DIAGNOSIS  The diagnosis of asthma is made by a review of your medical  history and a physical exam. Tests may also be performed. These may include:  Lung function studies. These tests show how much air you breathe in and out.  Allergy tests.  Imaging tests such as X-rays. TREATMENT  Asthma cannot be cured, but it can usually be controlled. Treatment involves identifying and avoiding your asthma triggers. It also involves medicines. There are 2 classes of medicine used for asthma treatment:   Controller medicines. These prevent asthma symptoms from occurring. They are usually taken every day.  Reliever or rescue medicines. These quickly relieve asthma symptoms. They are used as needed and provide short-term relief. Your health care provider will help you create an asthma action plan. An asthma action plan is a written plan for managing and treating your asthma attacks. It includes a list of your asthma triggers and how they may be avoided. It also includes information on when medicines should be taken and when their dosage should be changed. An action plan may also involve the use of a device called a peak flow meter. A peak flow meter measures how well the lungs are working. It helps you monitor your condition. HOME CARE INSTRUCTIONS   Take medicines only as directed by your health care provider. Speak with your health care provider if you have questions about how or when to take the medicines.  Use a peak flow meter as directed by your health care provider. Record and keep track of readings.  Understand and use the action plan to help minimize or stop an asthma attack without needing to seek medical care.  Control your home environment in the following ways to help prevent asthma attacks:  Do not smoke. Avoid being  exposed to secondhand smoke.  Change your heating and air conditioning filter regularly.  Limit your use of fireplaces and wood stoves.  Get rid of pests (such as roaches and mice) and their droppings.  Throw away plants if you see mold on  them.  Clean your floors and dust regularly. Use unscented cleaning products.  Try to have someone else vacuum for you regularly. Stay out of rooms while they are being vacuumed and for a short while afterward. If you vacuum, use a dust mask from a hardware store, a double-layered or microfilter vacuum cleaner bag, or a vacuum cleaner with a HEPA filter.  Replace carpet with wood, tile, or vinyl flooring. Carpet can trap dander and dust.  Use allergy-proof pillows, mattress covers, and box spring covers.  Wash bed sheets and blankets every week in hot water and dry them in a dryer.  Use blankets that are made of polyester or cotton.  Clean bathrooms and kitchens with bleach. If possible, have someone repaint the walls in these rooms with mold-resistant paint. Keep out of the rooms that are being cleaned and painted.  Wash hands frequently. SEEK MEDICAL CARE IF:   You have wheezing, shortness of breath, or a cough even if taking medicine to prevent attacks.  The colored mucus you cough up (sputum) is thicker than usual.  Your sputum changes from clear or white to yellow, green, gray, or bloody.  You have any problems that may be related to the medicines you are taking (such as a rash, itching, swelling, or trouble breathing).  You are using a reliever medicine more than 2-3 times per week.  Your peak flow is still at 50-79% of your personal best after following your action plan for 1 hour.  You have a fever. SEEK IMMEDIATE MEDICAL CARE IF:   You seem to be getting worse and are unresponsive to treatment during an asthma attack.  You are short of breath even at rest.  You get short of breath when doing very little physical activity.  You have difficulty eating, drinking, or talking due to asthma symptoms.  You develop chest pain.  You develop a fast heartbeat.  You have a bluish color to your lips or fingernails.  You are light-headed, dizzy, or faint.  Your peak flow  is less than 50% of your personal best.   This information is not intended to replace advice given to you by your health care provider. Make sure you discuss any questions you have with your health care provider.   Document Released: 07/22/2005 Document Revised: 04/12/2015 Document Reviewed: 02/18/2013 Elsevier Interactive Patient Education [employer]2016 [employer]

## 2015-06-05 NOTE — ED Provider Notes (Signed)
CSN: [id number]     Arrival date & time 06/05/15  1357 History   First MD Initiated Contact with Patient 06/05/15 1639     Chief Complaint  Patient presents with  . chest tightness   . Shortness of Breath     (Consider location/radiation/quality/duration/timing/severity/associated sxs/prior Treatment) Patient is a 64 y.o. female presenting with shortness of breath.  Shortness of Breath Associated symptoms: cough   Associated symptoms: no abdominal pain, no diaphoresis, no fever and no vomiting      PATTE WINKEL is a 64 y.o. female, pt with history of HTN and Asthma/COPD, presenting to ED with productive cough, white sputum x2 weeks, cough mostly resolved yesterday, then yesterday pt started having chest tightness and slight SOB. Able to speak in full sentences. Pain 6/10, pressure/heaviness, non-radiating. Pt states, "I think it is just gas."  States has hasn't taken her diovan lately and then feels exactly like this when she isn't taking the diovan. Pt admits to being a smoker. Pt denies current shortness of breath, abdominal pain, N/V/C/D, fever/chills, or any other pain or complaints.    Past Medical History  Diagnosis Date  . Hypertension   . Hyperlipidemia    Past Surgical History  Procedure Laterality Date  . Tonsillectomy    . Appendectomy    . Tubal ligation    . Abdominal hysterectomy    . Hernia repair     Family History  Problem Relation Age of Onset  . Heart failure Mother   . Cancer Father   . Cancer Sister    Social History  Substance Use Topics  . Smoking status: Current Every Day Smoker -- 0.75 packs/day    Types: Cigarettes  . Smokeless tobacco: Never Used  . Alcohol Use: No   OB History    No data available     Review of Systems  Constitutional: Negative for fever, chills, diaphoresis and unexpected weight change.  Respiratory: Positive for cough, chest tightness and shortness of breath.   Cardiovascular: Negative for palpitations and leg  swelling.  Gastrointestinal: Negative for nausea, vomiting, abdominal pain, diarrhea and constipation.  Genitourinary: Negative for dysuria and flank pain.  Musculoskeletal: Negative for back pain.  Skin: Negative for color change and pallor.  Neurological: Negative for dizziness, syncope, weakness and light-headedness.  All other systems reviewed and are negative.     Allergies  Codeine; Nsaids; Penicillins; and Sulfa antibiotics  Home Medications   Prior to Admission medications   Medication Sig Start Date End Date Taking? Authorizing Provider  albuterol (PROVENTIL) (2.5 MG/3ML) 0.083% nebulizer solution Take 3 mLs (2.5 mg total) by nebulization every 6 (six) hours as needed for wheezing or shortness of breath. 11/27/14  Yes Elson Areas, PA-C  Aspirin-Salicylamide-Caffeine (BC HEADACHE POWDER PO) Take 1 packet by mouth 2 (two) times daily as needed (headache).   Yes Historical Provider, MD  DEXILANT 60 MG capsule Take 60 mg by mouth every evening.  05/05/13  Yes Historical Provider, MD  hydrochlorothiazide (HYDRODIURIL) 12.5 MG tablet Take 12.5 mg by mouth daily.  05/09/15  Yes Historical Provider, MD  LYRICA 100 MG capsule Take 100 mg by mouth 2 (two) times daily.  05/05/13  Yes Historical Provider, MD  PROAIR HFA 108 (90 BASE) MCG/ACT inhaler Inhale 2 puffs into the lungs every 6 (six) hours as needed for wheezing or shortness of breath.  05/29/15  Yes Historical Provider, MD  risperiDONE (RISPERDAL) 1 MG tablet Take 1 mg by mouth at bedtime.  11/24/14  Yes Historical Provider, MD  sertraline (ZOLOFT) 100 MG tablet Take 100 mg by mouth every evening.  05/05/13  Yes Historical Provider, MD  simvastatin (ZOCOR) 40 MG tablet Take 40 mg by mouth at bedtime.  05/09/15  Yes Historical Provider, MD  traMADol (ULTRAM) 50 MG tablet Take 50 mg by mouth 2 (two) times daily.   Yes Historical Provider, MD  valsartan (DIOVAN) 160 MG tablet Take 80 mg by mouth every evening.  05/05/13  Yes Historical  Provider, MD  venlafaxine XR (EFFEXOR-XR) 37.5 MG 24 hr capsule Take 37.5 mg by mouth at bedtime.  05/11/15  Yes Historical Provider, MD  predniSONE (DELTASONE) 10 MG tablet 6,5,4,3,2,1 taper Patient not taking: Reported on 06/05/2015 11/27/14   Lonia Skinner Sofia, PA-C   BP 140/72 mmHg  Pulse 62  Temp(Src) 97.3 F (36.3 C) (Oral)  Resp 19  SpO2 99% Physical Exam  Constitutional: She appears well-developed and well-nourished. No distress.  HENT:  Head: Normocephalic and atraumatic.  Eyes: Conjunctivae are normal. Pupils are equal, round, and reactive to light.  Cardiovascular: Normal rate, regular rhythm and normal heart sounds.   Pulmonary/Chest: Effort normal. No tachypnea. No respiratory distress. She has wheezes in the right upper field, the right middle field, the right lower field, the left middle field and the left lower field. She exhibits no tenderness.  Abdominal: Soft. Bowel sounds are normal.  Musculoskeletal: She exhibits no edema or tenderness.  Neurological: She is alert.  Skin: Skin is warm and dry. She is not diaphoretic.  Nursing note and vitals reviewed.   ED Course  Procedures (including critical care time) Labs Review Labs Reviewed  BASIC METABOLIC PANEL - Abnormal; Notable for the following:    Chloride 112 (*)    All other components within normal limits  CBC  I-STAT TROPOININ, ED    Imaging Review Dg Chest 2 View  06/05/2015  CLINICAL DATA:  Onset of chest pain and tightness last night, 2 weeks of productive cough, history of hypertension, asthma/COPD, current smoker. EXAM: CHEST  2 VIEW COMPARISON:  PA and lateral chest x-ray of November 27, 2014 FINDINGS: The lungs are adequately inflated. There is no focal infiltrate. The interstitial markings are mildly prominent though stable. There is no pleural effusion or pneumothorax. The heart and pulmonary vascularity are normal. The mediastinum is normal in width. There is a stable moderate-sized hiatal hernia. The  bony thorax is unremarkable. IMPRESSION: Chronic asthmatic/bronchitic changes. There is no acute cardiopulmonary abnormality. Electronically Signed   By: David  [place] M.D.   On: 06/05/2015 14:45   I have personally reviewed and evaluated these images and lab results as part of my medical decision-making.   EKG Interpretation None      MDM   Final diagnoses:  Asthma exacerbation    ANASTON GRIMLEY presents with cough and chest tightness.  Suspect acute Asthma/COPD exacerbation. Plan to give breathing treatment, prednisone, pain control and reassess. Labs are free from significant abnormalities. CXR shows no acute changes. Pt states she is feeling much better and feels like she can go home. Pt was asked to please allow some treatment of her wheezing and some of her pain. Pt agreed and states, "If I feel better once those are done, then I want to go home." Pt confirms she will have a ride home.  6:37 PM Pt reassessed. Denies chest tightness, shortness of breath, or any other complaints. Wheezes are gone. Pt advised to follow up with PCP. Pt accepted plan of care.  Anselm Pancoast, PA-C 06/05/15 1837  Bethann Berkshire, MD 06/05/15 [phone]

## 2015-06-05 NOTE — ED Notes (Signed)
Pt reports productive cough, white sputum x2 weeks, cough mostly resolved yesterday, then yesterday pt started having chest tightness and slight SOB. Able to speak in full sentences. Pain 8/10.

## 2015-06-05 NOTE — ED Notes (Signed)
Pt reported having productive cough of white sputum x2 weeks, chest tightness, and SHOB. Pt denies nausea, fever and radiation.

## 2015-06-05 NOTE — ED Notes (Signed)
Awake. Verbally responsive. A/O x4. Resp even and unlabored. No audible adventitious breath sounds noted. ABC's intact.  

## 2016-02-07 ENCOUNTER — Encounter (HOSPITAL_COMMUNITY): Payer: Self-pay | Admitting: Emergency Medicine

## 2016-02-07 ENCOUNTER — Emergency Department (HOSPITAL_COMMUNITY): Payer: Medicare HMO

## 2016-02-07 ENCOUNTER — Emergency Department (HOSPITAL_COMMUNITY)
Admission: EM | Admit: 2016-02-07 | Discharge: 2016-02-07 | Disposition: A | Discharge status: Home / Self Care | Payer: Medicare HMO | Attending: Emergency Medicine | Admitting: Emergency Medicine

## 2016-02-07 DIAGNOSIS — Z7951 Long term (current) use of inhaled steroids: Secondary | ICD-10-CM | POA: Insufficient documentation

## 2016-02-07 DIAGNOSIS — E785 Hyperlipidemia, unspecified: Secondary | ICD-10-CM | POA: Diagnosis not present

## 2016-02-07 DIAGNOSIS — F17210 Nicotine dependence, cigarettes, uncomplicated: Secondary | ICD-10-CM | POA: Insufficient documentation

## 2016-02-07 DIAGNOSIS — Z79899 Other long term (current) drug therapy: Secondary | ICD-10-CM | POA: Insufficient documentation

## 2016-02-07 DIAGNOSIS — I10 Essential (primary) hypertension: Secondary | ICD-10-CM | POA: Diagnosis not present

## 2016-02-07 DIAGNOSIS — M5416 Radiculopathy, lumbar region: Secondary | ICD-10-CM | POA: Diagnosis not present

## 2016-02-07 DIAGNOSIS — M549 Dorsalgia, unspecified: Secondary | ICD-10-CM | POA: Diagnosis present

## 2016-02-07 MED ORDER — OXYCODONE-ACETAMINOPHEN 5-325 MG PO TABS: 1.0000 | ORAL_TABLET | Freq: Four times a day (QID) | ORAL | Status: DC | PRN

## 2016-02-07 MED ORDER — DIAZEPAM 5 MG PO TABS: 5.0000 mg | ORAL_TABLET | Freq: Four times a day (QID) | ORAL | Status: DC | PRN

## 2016-02-07 MED ORDER — DIAZEPAM 5 MG PO TABS
5.0000 mg | ORAL_TABLET | Freq: Once | ORAL | Status: AC
  Administered 2016-02-07: 5 mg via ORAL
  Filled 2016-02-07: qty 1

## 2016-02-07 MED ORDER — OXYCODONE-ACETAMINOPHEN 5-325 MG PO TABS
2.0000 | ORAL_TABLET | Freq: Once | ORAL | Status: AC
  Administered 2016-02-07: 2 via ORAL
  Filled 2016-02-07: qty 2

## 2016-02-07 NOTE — Discharge Instructions (Signed)
Take percocet for severe pain.   Take valium for muscle spasms   Do NOT drive with percocet or valium   See your doctor and spine doctor next week   No heavy lifting   Return to ER if you have severe back pain, numbness, weakness

## 2016-02-07 NOTE — ED Notes (Signed)
Patient was educated not to drive, operate heavy machinery, or drink alcohol while taking narcotic medication.  

## 2016-02-07 NOTE — ED Notes (Addendum)
Pt states that on Sunday she was lifting heavy boxes and now has back pain that shoots down her left leg. Pt states she had 2 back injuries in the 1990's, but has not experienced anything similar in many years. Pt states that moving and changing positions makes her pain worse. Pt states when she woke up this morning the pain was intense. She is currently experiencing lower mid back pain that intermittently shoots down her left leg. Pt is ambulatory with a steady gait.

## 2016-02-07 NOTE — ED Provider Notes (Signed)
CSN: [id number]     Arrival date & time 02/07/16  1624 History   First MD Initiated Contact with Patient 02/07/16 1636     Chief Complaint  Patient presents with  . Back Pain     (Consider location/radiation/quality/duration/timing/severity/associated sxs/prior Treatment) The history is provided by the patient.  Stephanie Benton is a 65 y.o. female hx of HTN, HL, stomach ulcer, back disc protrusion, here with back pain. She states that she lifted some heavy boxes 3 days ago. Denies any fall or injury to the back. States that right after she lifted the heavy boxes she feels that her back is sore. States that it is worse when she changes positions. Woke up this morning with worsening pain that shoots down her left leg. Denies any numbness or weakness. Denies any incontinence. States that she had a previous lumbar disc protrusion but did not require surgery.       Past Medical History  Diagnosis Date  . Hypertension   . Hyperlipidemia    Past Surgical History  Procedure Laterality Date  . Tonsillectomy    . Appendectomy    . Tubal ligation    . Abdominal hysterectomy    . Hernia repair     Family History  Problem Relation Age of Onset  . Heart failure Mother   . Cancer Father   . Cancer Sister    Social History  Substance Use Topics  . Smoking status: Current Every Day Smoker -- 0.75 packs/day    Types: Cigarettes  . Smokeless tobacco: Never Used  . Alcohol Use: No   OB History    No data available     Review of Systems  Musculoskeletal: Positive for back pain.  All other systems reviewed and are negative.     Allergies  Codeine; Morphine and related; Nsaids; Penicillins; and Sulfa antibiotics  Home Medications   Prior to Admission medications   Medication Sig Start Date End Date Taking? Authorizing Provider  albuterol (PROVENTIL) (2.5 MG/3ML) 0.083% nebulizer solution Take 3 mLs (2.5 mg total) by nebulization every 6 (six) hours as needed for wheezing or shortness  of breath. 11/27/14  Yes Leslie K Sofia, PA-C  DEXILANT 60 MG capsule Take 60 mg by mouth every evening.  05/05/13  Yes Historical Provider, MD  hydrochlorothiazide (HYDRODIURIL) 12.5 MG tablet Take 12.5 mg by mouth daily.  05/09/15  Yes Historical Provider, MD  LYRICA 100 MG capsule Take 100 mg by mouth 2 (two) times daily.  05/05/13  Yes Historical Provider, MD  PROAIR HFA 108 (90 BASE) MCG/ACT inhaler Inhale 2 puffs into the lungs every 6 (six) hours as needed for wheezing or shortness of breath.  05/29/15  Yes Historical Provider, MD  risperiDONE (RISPERDAL) 1 MG tablet Take 1 mg by mouth at bedtime.  11/24/14  Yes Historical Provider, MD  sertraline (ZOLOFT) 100 MG tablet Take 100 mg by mouth every evening.  05/05/13  Yes Historical Provider, MD  simvastatin (ZOCOR) 40 MG tablet Take 40 mg by mouth at bedtime.  05/09/15  Yes Historical Provider, MD  valsartan (DIOVAN) 160 MG tablet Take 80 mg by mouth every evening.  05/05/13  Yes Historical Provider, MD  venlafaxine XR (EFFEXOR-XR) 37.5 MG 24 hr capsule Take 37.5 mg by mouth at bedtime.  05/11/15  Yes Historical Provider, MD  Aspirin-Salicylamide-Caffeine (BC HEADACHE POWDER PO) Take 1 packet by mouth 2 (two) times daily as needed (headache).    Historical Provider, MD  predniSONE (DELTASONE) 10 MG tablet 6,5,4,3,2,1  taper Patient not taking: Reported on 06/05/2015 11/27/14   Elson Areas, PA-C   BP 153/76 mmHg  Pulse 66  Temp(Src) 98.5 F (36.9 C) (Oral)  Resp 16  Ht 5\' 5"  (1.651 m)  Wt 230 lb (104.327 kg)  BMI 38.27 kg/m2  SpO2 95% Physical Exam  Constitutional: She is oriented to person, place, and time.  Uncomfortable   HENT:  Head: Normocephalic.  Mouth/Throat: Oropharynx is clear and moist.  Eyes: Conjunctivae are normal. Pupils are equal, round, and reactive to light.  Neck: Normal range of motion. Neck supple.  Cardiovascular: Normal rate, regular rhythm and normal heart sounds.   Pulmonary/Chest: Effort normal and breath sounds  normal. No respiratory distress. She has no wheezes. She has no rales.  Abdominal: Soft. Bowel sounds are normal. She exhibits no distension. There is no tenderness. There is no rebound.  No pulsatile mass   Musculoskeletal:  + diffuse lower lumbar spasms. No obvious deformity   Neurological: She is alert and oriented to person, place, and time.  No saddle anesthesia. + straight leg raise left leg. Neurovascular intact lower extremities   Skin: Skin is warm and dry.  Psychiatric: She has a normal mood and affect. Her behavior is normal. Judgment and thought content normal.  Nursing note and vitals reviewed.   ED Course  Procedures (including critical care time) Labs Review Labs Reviewed - No data to display  Imaging Review Ct Lumbar Spine Wo Contrast  02/07/2016  CLINICAL DATA:  Lifting injury with pain extending to the left leg all the way to the foot. Injury occurred 3 days ago lifting boxes. EXAM: CT LUMBAR SPINE WITHOUT CONTRAST TECHNIQUE: Multidetector CT imaging of the lumbar spine was performed without intravenous contrast administration. Multiplanar CT image reconstructions were also generated. COMPARISON:  MRI 10/13/2008 FINDINGS: Five lumbar type vertebral bodies with transitional S1 segment as described on the previous MRI. Alignment is normal. T12-L1:  Normal interspace. L1-2:  Normal interspace. L2-3:  Normal interspace. L3-4:  Normal interspace. L4-5: Mild bulging of the disc. Moderate facet arthropathy bilaterally. No slippage or stenosis. L5-S1: Mild bulging of the disc. Advanced bilateral facet arthropathy with gaping, gas filled facet joints. No central canal stenosis. Narrowing of the subarticular lateral recesses and foramina. This would likely worsen with standing or flexion, as anterolisthesis may occur. S1-2:  Transitional and normal. Sacroiliac joints show osteoarthritis, left more than right. IMPRESSION: S1 is a transitional vertebra. L5-S1: Disc bulge. Bilateral facet  arthropathy with gaping, gas-filled facet joints. Narrowing of the subarticular lateral recesses and foramina which would likely worsen with standing or flexion, as anterolisthesis would probably occurred given this facet morphology. L4-5: Disc bulge. Moderate facet arthropathy but no slippage or stenosis. Sacroiliac arthropathy left worse than right. Degenerative changes have worsened when compared to the MRI of 2010. Electronically Signed   By: Paulina Fusi M.D.   On: 02/07/2016 17:32   I have personally reviewed and evaluated these images and lab results as part of my medical decision-making.   EKG Interpretation None      MDM   Final diagnoses:  None   Stephanie Benton is a 65 y.o. female here with back pain radiate to Left leg. Likely sciatica from spasms or heavy lifting. Has hx of lumbar disc disease and neurovascular intact currently. Will get CT lumbar spine to further clarify. Has allergies to morphine, codeine, NSAIDs. Will give percocet, valium for now.   6:38 PM CT showed L5-S1 disc bulge. Patient also has  degenerative changes. Pain improved. Has multiple allergies but tolerated percocet, valium. Will dc home.     Richardean Canal, MD 02/07/16 Paulo Fruit

## 2016-02-21 DIAGNOSIS — E7800 Pure hypercholesterolemia, unspecified: Secondary | ICD-10-CM | POA: Insufficient documentation

## 2016-03-21 ENCOUNTER — Other Ambulatory Visit: Payer: Self-pay | Admitting: Neurological Surgery

## 2016-03-21 DIAGNOSIS — M5416 Radiculopathy, lumbar region: Secondary | ICD-10-CM

## 2016-03-28 ENCOUNTER — Ambulatory Visit
Admission: RE | Admit: 2016-03-28 | Discharge: 2016-03-28 | Disposition: A | Discharge status: Home / Self Care | Payer: Medicare HMO | Source: Ambulatory Visit | Attending: Neurological Surgery | Admitting: Neurological Surgery

## 2016-03-28 DIAGNOSIS — M5416 Radiculopathy, lumbar region: Secondary | ICD-10-CM

## 2016-05-06 DIAGNOSIS — Z9181 History of falling: Secondary | ICD-10-CM | POA: Insufficient documentation

## 2016-08-08 DIAGNOSIS — M47816 Spondylosis without myelopathy or radiculopathy, lumbar region: Secondary | ICD-10-CM | POA: Insufficient documentation

## 2017-03-04 DIAGNOSIS — E538 Deficiency of other specified B group vitamins: Secondary | ICD-10-CM | POA: Insufficient documentation

## 2017-04-22 ENCOUNTER — Institutional Professional Consult (permissible substitution): Payer: Medicare HMO | Admitting: Internal Medicine

## 2017-05-01 DIAGNOSIS — M25552 Pain in left hip: Secondary | ICD-10-CM

## 2017-05-01 DIAGNOSIS — M25551 Pain in right hip: Secondary | ICD-10-CM | POA: Insufficient documentation

## 2017-05-21 ENCOUNTER — Institutional Professional Consult (permissible substitution): Payer: Medicare HMO | Admitting: Internal Medicine

## 2017-06-12 DIAGNOSIS — F17210 Nicotine dependence, cigarettes, uncomplicated: Secondary | ICD-10-CM | POA: Insufficient documentation

## 2017-07-04 DIAGNOSIS — F3177 Bipolar disorder, in partial remission, most recent episode mixed: Secondary | ICD-10-CM | POA: Insufficient documentation

## 2017-07-04 DIAGNOSIS — M8589 Other specified disorders of bone density and structure, multiple sites: Secondary | ICD-10-CM | POA: Insufficient documentation

## 2017-07-04 DIAGNOSIS — F3175 Bipolar disorder, in partial remission, most recent episode depressed: Secondary | ICD-10-CM | POA: Insufficient documentation

## 2017-07-04 DIAGNOSIS — F3178 Bipolar disorder, in full remission, most recent episode mixed: Secondary | ICD-10-CM | POA: Insufficient documentation

## 2017-11-08 ENCOUNTER — Encounter (HOSPITAL_COMMUNITY): Payer: Self-pay

## 2017-11-08 ENCOUNTER — Emergency Department (HOSPITAL_COMMUNITY): Payer: Medicare HMO

## 2017-11-08 ENCOUNTER — Emergency Department (HOSPITAL_COMMUNITY)
Admission: EM | Admit: 2017-11-08 | Discharge: 2017-11-08 | Disposition: A | Discharge status: Home / Self Care | Payer: Medicare HMO | Attending: Emergency Medicine | Admitting: Emergency Medicine

## 2017-11-08 ENCOUNTER — Other Ambulatory Visit: Payer: Self-pay

## 2017-11-08 DIAGNOSIS — Y929 Unspecified place or not applicable: Secondary | ICD-10-CM | POA: Insufficient documentation

## 2017-11-08 DIAGNOSIS — S6991XA Unspecified injury of right wrist, hand and finger(s), initial encounter: Secondary | ICD-10-CM | POA: Diagnosis present

## 2017-11-08 DIAGNOSIS — W01198A Fall on same level from slipping, tripping and stumbling with subsequent striking against other object, initial encounter: Secondary | ICD-10-CM | POA: Diagnosis not present

## 2017-11-08 DIAGNOSIS — Y999 Unspecified external cause status: Secondary | ICD-10-CM | POA: Diagnosis not present

## 2017-11-08 DIAGNOSIS — Y9301 Activity, walking, marching and hiking: Secondary | ICD-10-CM | POA: Insufficient documentation

## 2017-11-08 DIAGNOSIS — F17210 Nicotine dependence, cigarettes, uncomplicated: Secondary | ICD-10-CM | POA: Diagnosis not present

## 2017-11-08 DIAGNOSIS — I10 Essential (primary) hypertension: Secondary | ICD-10-CM | POA: Insufficient documentation

## 2017-11-08 DIAGNOSIS — S63501A Unspecified sprain of right wrist, initial encounter: Secondary | ICD-10-CM

## 2017-11-08 DIAGNOSIS — S8001XA Contusion of right knee, initial encounter: Secondary | ICD-10-CM | POA: Diagnosis not present

## 2017-11-08 DIAGNOSIS — M25562 Pain in left knee: Secondary | ICD-10-CM

## 2017-11-08 DIAGNOSIS — S8002XA Contusion of left knee, initial encounter: Secondary | ICD-10-CM

## 2017-11-08 DIAGNOSIS — M170 Bilateral primary osteoarthritis of knee: Secondary | ICD-10-CM

## 2017-11-08 DIAGNOSIS — W19XXXA Unspecified fall, initial encounter: Secondary | ICD-10-CM

## 2017-11-08 DIAGNOSIS — M25561 Pain in right knee: Secondary | ICD-10-CM

## 2017-11-08 DIAGNOSIS — Z79899 Other long term (current) drug therapy: Secondary | ICD-10-CM | POA: Diagnosis not present

## 2017-11-08 NOTE — ED Triage Notes (Signed)
Fell on Thursday and hurt knees bilaterally and right wrist states she fell forward on concrete no LOC voiced.

## 2017-11-08 NOTE — ED Provider Notes (Signed)
[facility]-EMERGENCY DEPT Provider Note   CSN: [id number] Arrival date & time: 11/08/17  2002     History   Chief Complaint Chief Complaint  Patient presents with  . Fall    HPI Stephanie Benton is a 67 y.o. female with a PMHx of COPD, HLD, and HTN, who presents to the ED with complaints of mechanical fall that occurred 2 days ago.  Patient states that she was walking and did not see a wheelchair ramp, tripped on the edge of the ramp and fell forward on the concrete surface landing on both hands and both knees.  She now complains of 9/10 constant sharp bilateral knee pain left worse than right which radiates down her legs, worse with walking, and unrelieved with BC powders, ice, and heat.  She also reports associated right wrist pain and left knee swelling.  She denies any swelling in the other joints.  She denies any bruising, cuts or abrasions, head injury, LOC, numbness, tingling, focal weakness, or any other complaints at this time or injury sustained during the incident.  She is not on any blood thinners.  The history is provided by the patient and medical records. No language interpreter was used.  Fall   Knee Pain   This is a new problem. The current episode started 2 days ago. The problem occurs constantly. The problem has not changed since onset.The pain is present in the left knee and right knee. The quality of the pain is described as sharp. The pain is at a severity of 9/10. The pain is moderate. Pertinent negatives include no numbness and no tingling. The symptoms are aggravated by standing and activity. She has tried cold, heat and OTC pain medications (ice, heat, and BC powders) for the symptoms. The treatment provided no relief. There has been a history of trauma.    Past Medical History:  Diagnosis Date  . Hyperlipidemia   . Hypertension     Patient Active Problem List   Diagnosis Date Noted  . COPD exacerbation (HCC) 05/18/2013    Past Surgical  History:  Procedure Laterality Date  . ABDOMINAL HYSTERECTOMY    . APPENDECTOMY    . HERNIA REPAIR    . TONSILLECTOMY    . TUBAL LIGATION       OB History   None      Home Medications    Prior to Admission medications   Medication Sig Start Date End Date Taking? Authorizing Provider  albuterol (PROVENTIL) (2.5 MG/3ML) 0.083% nebulizer solution Take 3 mLs (2.5 mg total) by nebulization every 6 (six) hours as needed for wheezing or shortness of breath. 11/27/14   Elson Areas, PA-C  Aspirin-Salicylamide-Caffeine (BC HEADACHE POWDER PO) Take 1 packet by mouth 2 (two) times daily as needed (headache).    [provider]  DEXILANT 60 MG capsule Take 60 mg by mouth every evening.  05/05/13   [provider]  diazepam (VALIUM) 5 MG tablet Take 1 tablet (5 mg total) by mouth every 6 (six) hours as needed for anxiety (spasms). 02/07/16   Charlynne Pander, MD  hydrochlorothiazide (HYDRODIURIL) 12.5 MG tablet Take 12.5 mg by mouth daily.  05/09/15   [provider]  LYRICA 100 MG capsule Take 100 mg by mouth 2 (two) times daily.  05/05/13   [provider]  oxyCODONE-acetaminophen (PERCOCET) 5-325 MG tablet Take 1-2 tablets by mouth every 6 (six) hours as needed. 02/07/16   Charlynne Pander, MD  predniSONE Lorenza Chick)  10 MG tablet 6,5,4,3,2,1 taper Patient not taking: Reported on 06/05/2015 11/27/14   Osie Cheeks  [facility] HFA 108 (90 BASE) MCG/ACT inhaler Inhale 2 puffs into the lungs every 6 (six) hours as needed for wheezing or shortness of breath.  05/29/15   [provider]  risperiDONE (RISPERDAL) 1 MG tablet Take 1 mg by mouth at bedtime.  11/24/14   [provider]  sertraline (ZOLOFT) 100 MG tablet Take 100 mg by mouth every evening.  05/05/13   [provider]  simvastatin (ZOCOR) 40 MG tablet Take 40 mg by mouth at bedtime.  05/09/15   [provider]  valsartan (DIOVAN) 160 MG tablet Take 80 mg by mouth every  evening.  05/05/13   [provider]  venlafaxine XR (EFFEXOR-XR) 37.5 MG 24 hr capsule Take 37.5 mg by mouth at bedtime.  05/11/15   [provider]    Family History Family History  Problem Relation Age of Onset  . Heart failure Mother   . Cancer Father   . Cancer Sister     Social History Social History   Tobacco Use  . Smoking status: Current Every Day Smoker    Packs/day: 0.75    Types: Cigarettes  . Smokeless tobacco: Never Used  Substance Use Topics  . Alcohol use: No  . Drug use: No     Allergies   Codeine; Morphine and related; Nsaids; Penicillins; and Sulfa antibiotics   Review of Systems Review of Systems  HENT: Negative for facial swelling (no head inj).   Musculoskeletal: Positive for arthralgias and joint swelling.  Skin: Negative for color change and wound.  Allergic/Immunologic: Negative for immunocompromised state.  Neurological: Negative for tingling, syncope, weakness and numbness.  Hematological: Does not bruise/bleed easily.  Psychiatric/Behavioral: Negative for confusion.     Physical Exam Updated Vital Signs BP (!) 139/59 (BP Location: Right Arm)   Pulse 72   Temp 98.1 F (36.7 C) (Oral)   Resp 16   Ht 5\' 5"  (1.651 m)   Wt 93 kg (205 lb)   SpO2 98%   BMI 34.11 kg/m   Physical Exam  Constitutional: She is oriented to person, place, and time. Vital signs are normal. She appears well-developed and well-nourished.  Non-toxic appearance. No distress.  Afebrile, nontoxic, NAD  HENT:  Head: Normocephalic and atraumatic.  Mouth/Throat: Mucous membranes are normal.  Eyes: Conjunctivae and EOM are normal. Right eye exhibits no discharge. Left eye exhibits no discharge.  Neck: Normal range of motion. Neck supple.  Cardiovascular: Normal rate and intact distal pulses.  Pulmonary/Chest: Effort normal. No respiratory distress.  Abdominal: Normal appearance. She exhibits no distension.  Musculoskeletal: Normal range of motion.         Right wrist: She exhibits tenderness and bony tenderness. She exhibits normal range of motion, no swelling, no effusion, no crepitus, no deformity and no laceration.       Right knee: She exhibits normal range of motion, no swelling, no effusion, no ecchymosis, no deformity, no laceration, no erythema, normal alignment, no LCL laxity, normal patellar mobility and no MCL laxity. Tenderness found.       Left knee: She exhibits swelling. She exhibits normal range of motion, no ecchymosis, no deformity, no laceration, no erythema, normal alignment, no LCL laxity, normal patellar mobility and no MCL laxity. Tenderness found.  Bilateral knees with FROM intact, with diffuse joint line TTP, mild swelling to L knee medially, but no swelling/effusion in R knee. No deformity,  no bruising or erythema, no warmth, no abnormal alignment or patellar mobility, no varus/valgus laxity, neg anterior drawer test, no crepitus. Gait steady. R wrist with FROM intact, with mild TTP diffusely in the wrist but no tenderness to forearm or hand, no crepitus or deformity, no focal anatomical snuffbox tenderness, no swelling or bruising, no erythema or warmth.  Strength and sensation grossly intact in all extremities, distal pulses intact, compartments soft   Neurological: She is alert and oriented to person, place, and time. She has normal strength. No sensory deficit.  Skin: Skin is warm, dry and intact. No rash noted.  Psychiatric: She has a normal mood and affect. Her behavior is normal.  Nursing note and vitals reviewed.    ED Treatments / Results  Labs (all labs ordered are listed, but only abnormal results are displayed) Labs Reviewed - No data to display  EKG None  Radiology Dg Wrist Complete Right  Result Date: 11/08/2017 CLINICAL DATA:  Right wrist pain after fall 2 days ago. EXAM: RIGHT WRIST - COMPLETE 3+ VIEW COMPARISON:  None. FINDINGS: There is no evidence of fracture or dislocation. There is no  evidence of arthropathy or other focal bone abnormality. Soft tissues are unremarkable. IMPRESSION: No acute fracture or malalignment of the right wrist. Electronically Signed   By: Tollie Eth M.D.   On: 11/08/2017 21:50   Dg Knee Complete 4 Views Left  Result Date: 11/08/2017 CLINICAL DATA:  Bilateral anterior knee pain after fall 2 days ago. EXAM: LEFT KNEE - COMPLETE 4+ VIEW COMPARISON:  None. FINDINGS: Small suprapatellar joint effusion. Tricompartmental osteoarthritic joint space narrowing and spurring of the left knee more so medially within the femorotibial compartment. No intra-articular loose body, fracture or joint dislocation. Unremarkable periarticular soft tissues. IMPRESSION: No acute osseous abnormality. Small joint effusion with tricompartmental osteoarthritis. Electronically Signed   By: Tollie Eth M.D.   On: 11/08/2017 21:47   Dg Knee Complete 4 Views Right  Result Date: 11/08/2017 CLINICAL DATA:  Right knee pain after fall 2 days ago. EXAM: RIGHT KNEE - COMPLETE 4+ VIEW COMPARISON:  None. FINDINGS: Femorotibial and patellofemoral joint space narrowing with spurring is identified consistent with osteoarthritis more so along the medial femorotibial compartment. Trace joint effusion. No acute fracture nor joint dislocation. No significant soft tissue swelling. IMPRESSION: Osteoarthritis of the right knee without acute osseous abnormality. Electronically Signed   By: Tollie Eth M.D.   On: 11/08/2017 21:49    Procedures Procedures (including critical care time)  Medications Ordered in ED Medications - No data to display   Initial Impression / Assessment and Plan / ED Course  I have reviewed the triage vital signs and the nursing notes.  Pertinent labs & imaging results that were available during my care of the patient were reviewed by me and considered in my medical decision making (see chart for details).     67 y.o. female here with b/l knee pain and R wrist pain s/p  mechanical fall 2 days ago. On exam, diffuse R wrist TTP, no bruising/swelling/crepitus/deformity, FROM intact; b/l knees with diffuse joint line TTP, no bruising/crepitus/deformity, mild swelling to L knee but none to R knee. All extremities NVI with soft compartments, pt able to ambulate. FROM intact in b/l knees. Although doubt any osseous injury, given point bony tenderness, will obtain xrays of the areas. Will reassess shortly.   9:54 PM R knee xray with arthritic findings but no other acute findings. L knee xray with tricompartmental arthritis and small  effusion but otherwise no acute injury. R wrist xray negative for injury. Likely just contusions of the knees, and wrist sprain; doubt occult tibial plateau fx's given that she's been ambulating without difficulty. Will apply knee sleeves for comfort/compression, and velcro cock up wrist splint. Doubt need for crutches since she's ambulating without difficulty, and since her wrist is also hurting. Advised RICE/tylenol/motrin use for pain, and f/up with PCP in 1wk for recheck. Doubt need for ortho referral at this time, PCP can f/up and if they feel ortho is needed then they can refer at that time. I explained the diagnosis and have given explicit precautions to return to the ER including for any other new or worsening symptoms. The patient understands and accepts the medical plan as it's been dictated and I have answered their questions. Discharge instructions concerning home care and prescriptions have been given. The patient is STABLE and is discharged to home in good condition.    Final Clinical Impressions(s) / ED Diagnoses   Final diagnoses:  Sprain of right wrist, initial encounter  Contusion of right knee, initial encounter  Contusion of left knee, initial encounter  Fall, initial encounter  Acute pain of both knees  Arthritis of both knees    ED Discharge Orders    [address], [place], [place] 11/08/17 2154    Pricilla Loveless, MD 11/08/17 [phone]

## 2017-11-08 NOTE — Discharge Instructions (Addendum)
You have some arthritis in your knees. Wear knee sleeves for 2 weeks to stabilize and protect and compress the knees to help with pain and swelling. You likely sprained your wrist. Wear wrist brace for at least 2 weeks for stabilization of wrist. Ice and elevate wrist and knees throughout the day, using ice pack for no more than 20 minutes every hour.  Alternate between tylenol and motrin as needed for pain relief. Follow up with your regular doctor in 1 week for recheck of symptoms and ongoing management of your injuries. Return to the ER for changes or worsening symptoms.

## 2017-11-08 NOTE — ED Notes (Signed)
Called for triage with no answer

## 2018-02-16 ENCOUNTER — Encounter (HOSPITAL_COMMUNITY): Payer: Self-pay | Admitting: Emergency Medicine

## 2018-02-16 ENCOUNTER — Inpatient Hospital Stay (HOSPITAL_COMMUNITY)
Admission: EM | Admit: 2018-02-16 | Discharge: 2018-02-18 | DRG: 191 | Disposition: A | Discharge status: Home / Self Care | Payer: Medicare Other | Attending: Internal Medicine | Admitting: Internal Medicine

## 2018-02-16 ENCOUNTER — Emergency Department (HOSPITAL_COMMUNITY): Payer: Medicare Other

## 2018-02-16 DIAGNOSIS — Z885 Allergy status to narcotic agent status: Secondary | ICD-10-CM | POA: Diagnosis not present

## 2018-02-16 DIAGNOSIS — F17210 Nicotine dependence, cigarettes, uncomplicated: Secondary | ICD-10-CM | POA: Diagnosis not present

## 2018-02-16 DIAGNOSIS — J209 Acute bronchitis, unspecified: Secondary | ICD-10-CM | POA: Diagnosis present

## 2018-02-16 DIAGNOSIS — Z716 Tobacco abuse counseling: Secondary | ICD-10-CM | POA: Diagnosis not present

## 2018-02-16 DIAGNOSIS — Z886 Allergy status to analgesic agent status: Secondary | ICD-10-CM | POA: Diagnosis not present

## 2018-02-16 DIAGNOSIS — E785 Hyperlipidemia, unspecified: Secondary | ICD-10-CM | POA: Diagnosis present

## 2018-02-16 DIAGNOSIS — Z882 Allergy status to sulfonamides status: Secondary | ICD-10-CM | POA: Diagnosis not present

## 2018-02-16 DIAGNOSIS — J440 Chronic obstructive pulmonary disease with acute lower respiratory infection: Secondary | ICD-10-CM | POA: Diagnosis not present

## 2018-02-16 DIAGNOSIS — N2889 Other specified disorders of kidney and ureter: Secondary | ICD-10-CM | POA: Diagnosis present

## 2018-02-16 DIAGNOSIS — Z7952 Long term (current) use of systemic steroids: Secondary | ICD-10-CM

## 2018-02-16 DIAGNOSIS — Z880 Allergy status to penicillin: Secondary | ICD-10-CM

## 2018-02-16 DIAGNOSIS — J441 Chronic obstructive pulmonary disease with (acute) exacerbation: Principal | ICD-10-CM | POA: Diagnosis present

## 2018-02-16 DIAGNOSIS — I10 Essential (primary) hypertension: Secondary | ICD-10-CM | POA: Diagnosis not present

## 2018-02-16 DIAGNOSIS — Z79899 Other long term (current) drug therapy: Secondary | ICD-10-CM

## 2018-02-16 DIAGNOSIS — G8929 Other chronic pain: Secondary | ICD-10-CM | POA: Diagnosis not present

## 2018-02-16 DIAGNOSIS — Z90710 Acquired absence of both cervix and uterus: Secondary | ICD-10-CM

## 2018-02-16 DIAGNOSIS — D649 Anemia, unspecified: Secondary | ICD-10-CM | POA: Diagnosis not present

## 2018-02-16 DIAGNOSIS — F418 Other specified anxiety disorders: Secondary | ICD-10-CM | POA: Diagnosis not present

## 2018-02-16 DIAGNOSIS — N179 Acute kidney failure, unspecified: Secondary | ICD-10-CM | POA: Diagnosis not present

## 2018-02-16 DIAGNOSIS — E875 Hyperkalemia: Secondary | ICD-10-CM | POA: Diagnosis not present

## 2018-02-16 DIAGNOSIS — R079 Chest pain, unspecified: Secondary | ICD-10-CM | POA: Diagnosis present

## 2018-02-16 LAB — CBC
HCT: 34 % — ABNORMAL LOW (ref 36.0–46.0)
Hemoglobin: 10.8 g/dL — ABNORMAL LOW (ref 12.0–15.0)
MCH: 29.3 pg (ref 26.0–34.0)
MCHC: 31.8 g/dL (ref 30.0–36.0)
MCV: 92.1 fL (ref 78.0–100.0)
Platelets: 185 10*3/uL (ref 150–400)
RBC: 3.69 MIL/uL — AB (ref 3.87–5.11)
RDW: 14.6 % (ref 11.5–15.5)
WBC: 8.8 10*3/uL (ref 4.0–10.5)

## 2018-02-16 LAB — I-STAT TROPONIN, ED: TROPONIN I, POC: 0 ng/mL (ref 0.00–0.08)

## 2018-02-16 LAB — BASIC METABOLIC PANEL
ANION GAP: 12 (ref 5–15)
BUN: 20 mg/dL (ref 8–23)
CALCIUM: 8.8 mg/dL — AB (ref 8.9–10.3)
CO2: 23 mmol/L (ref 22–32)
Chloride: 100 mmol/L (ref 98–111)
Creatinine, Ser: 2.26 mg/dL — ABNORMAL HIGH (ref 0.44–1.00)
GFR, EST AFRICAN AMERICAN: 25 mL/min — AB (ref >60)
GFR, EST NON AFRICAN AMERICAN: 21 mL/min — AB (ref >60)
Glucose, Bld: 87 mg/dL (ref 70–99)
Potassium: 5.1 mmol/L (ref 3.5–5.1)
SODIUM: 135 mmol/L (ref 135–145)

## 2018-02-16 LAB — BRAIN NATRIURETIC PEPTIDE: B NATRIURETIC PEPTIDE 5: 6.1 pg/mL (ref 0.0–100.0)

## 2018-02-16 MED ORDER — IPRATROPIUM-ALBUTEROL 0.5-2.5 (3) MG/3ML IN SOLN
3.0000 mL | Freq: Once | RESPIRATORY_TRACT | Status: AC
  Administered 2018-02-17: 3 mL via RESPIRATORY_TRACT
  Filled 2018-02-16: qty 3

## 2018-02-16 MED ORDER — METHYLPREDNISOLONE SODIUM SUCC 125 MG IJ SOLR
125.0000 mg | Freq: Once | INTRAMUSCULAR | Status: AC
  Administered 2018-02-17: 125 mg via INTRAVENOUS
  Filled 2018-02-16: qty 2

## 2018-02-16 MED ORDER — ALBUTEROL SULFATE (2.5 MG/3ML) 0.083% IN NEBU
5.0000 mg | INHALATION_SOLUTION | Freq: Once | RESPIRATORY_TRACT | Status: AC
  Administered 2018-02-16: 5 mg via RESPIRATORY_TRACT
  Filled 2018-02-16: qty 6

## 2018-02-16 NOTE — ED Triage Notes (Signed)
Pt reports SOB, cough, and pain around ribs that started Sunday. Audibly wheezing in triage.  Reports EMS came out to her home on Sunday for an asthma attack and received breathing treatments but refused the hospital.

## 2018-02-16 NOTE — ED Provider Notes (Signed)
Patient placed in Quick Look pathway, seen and evaluated   Chief Complaint: Shortness of breath  HPI:   SOB since Sunday. Not improved with spiriva and 3 inhaler puffs at home today. Associated L leg swelling, which started on Sunday.   ROS: SOB  Physical Exam:   Gen: No distress  Neuro: Awake and Alert  Skin: Warm    Focused Exam: expiratory rales/rhonchi in all fields. Pt is tremulous from albuterol treatment.   Pt on the way to xray- if not roomed, will reassess breathing status after xray to add further tx.    Initiation of care has begun. The patient has been counseled on the process, plan, and necessity for staying for the completion/evaluation, and the remainder of the medical screening examination    Alveria Apley, PA-C 02/16/18 2145    Linwood Dibbles, MD 02/16/18 2311

## 2018-02-17 ENCOUNTER — Other Ambulatory Visit: Payer: Self-pay

## 2018-02-17 ENCOUNTER — Inpatient Hospital Stay (HOSPITAL_COMMUNITY): Payer: Medicare Other

## 2018-02-17 ENCOUNTER — Encounter (HOSPITAL_COMMUNITY): Payer: Self-pay | Admitting: Family Medicine

## 2018-02-17 DIAGNOSIS — R079 Chest pain, unspecified: Secondary | ICD-10-CM

## 2018-02-17 DIAGNOSIS — I10 Essential (primary) hypertension: Secondary | ICD-10-CM | POA: Diagnosis present

## 2018-02-17 DIAGNOSIS — F418 Other specified anxiety disorders: Secondary | ICD-10-CM

## 2018-02-17 DIAGNOSIS — J441 Chronic obstructive pulmonary disease with (acute) exacerbation: Secondary | ICD-10-CM | POA: Diagnosis present

## 2018-02-17 DIAGNOSIS — J440 Chronic obstructive pulmonary disease with acute lower respiratory infection: Secondary | ICD-10-CM | POA: Diagnosis not present

## 2018-02-17 DIAGNOSIS — Z79899 Other long term (current) drug therapy: Secondary | ICD-10-CM | POA: Diagnosis not present

## 2018-02-17 DIAGNOSIS — D649 Anemia, unspecified: Secondary | ICD-10-CM | POA: Diagnosis present

## 2018-02-17 DIAGNOSIS — Z880 Allergy status to penicillin: Secondary | ICD-10-CM | POA: Diagnosis not present

## 2018-02-17 DIAGNOSIS — Z886 Allergy status to analgesic agent status: Secondary | ICD-10-CM | POA: Diagnosis not present

## 2018-02-17 DIAGNOSIS — Z90710 Acquired absence of both cervix and uterus: Secondary | ICD-10-CM | POA: Diagnosis not present

## 2018-02-17 DIAGNOSIS — E785 Hyperlipidemia, unspecified: Secondary | ICD-10-CM | POA: Diagnosis not present

## 2018-02-17 DIAGNOSIS — N289 Disorder of kidney and ureter, unspecified: Secondary | ICD-10-CM | POA: Diagnosis not present

## 2018-02-17 DIAGNOSIS — Z716 Tobacco abuse counseling: Secondary | ICD-10-CM | POA: Diagnosis not present

## 2018-02-17 DIAGNOSIS — N2889 Other specified disorders of kidney and ureter: Secondary | ICD-10-CM | POA: Diagnosis not present

## 2018-02-17 DIAGNOSIS — Z885 Allergy status to narcotic agent status: Secondary | ICD-10-CM | POA: Diagnosis not present

## 2018-02-17 DIAGNOSIS — F17210 Nicotine dependence, cigarettes, uncomplicated: Secondary | ICD-10-CM | POA: Diagnosis not present

## 2018-02-17 DIAGNOSIS — E875 Hyperkalemia: Secondary | ICD-10-CM | POA: Diagnosis not present

## 2018-02-17 DIAGNOSIS — N179 Acute kidney failure, unspecified: Secondary | ICD-10-CM | POA: Diagnosis not present

## 2018-02-17 DIAGNOSIS — Z882 Allergy status to sulfonamides status: Secondary | ICD-10-CM | POA: Diagnosis not present

## 2018-02-17 DIAGNOSIS — G8929 Other chronic pain: Secondary | ICD-10-CM | POA: Diagnosis not present

## 2018-02-17 DIAGNOSIS — Z7952 Long term (current) use of systemic steroids: Secondary | ICD-10-CM | POA: Diagnosis not present

## 2018-02-17 DIAGNOSIS — J209 Acute bronchitis, unspecified: Secondary | ICD-10-CM | POA: Diagnosis not present

## 2018-02-17 LAB — RETICULOCYTES
RBC.: 3.51 MIL/uL — AB (ref 3.87–5.11)
RETIC COUNT ABSOLUTE: 45.6 10*3/uL (ref 19.0–186.0)
RETIC CT PCT: 1.3 % (ref 0.4–3.1)

## 2018-02-17 LAB — FERRITIN: FERRITIN: 89 ng/mL (ref 11–307)

## 2018-02-17 LAB — VITAMIN B12: VITAMIN B 12: 255 pg/mL (ref 180–914)

## 2018-02-17 LAB — FOLATE: Folate: 5.9 ng/mL — ABNORMAL LOW (ref >5.9)

## 2018-02-17 LAB — BASIC METABOLIC PANEL
ANION GAP: 9 (ref 5–15)
BUN: 18 mg/dL (ref 8–23)
CALCIUM: 9.1 mg/dL (ref 8.9–10.3)
CO2: 21 mmol/L — AB (ref 22–32)
Chloride: 110 mmol/L (ref 98–111)
Creatinine, Ser: 1.72 mg/dL — ABNORMAL HIGH (ref 0.44–1.00)
GFR calc Af Amer: 34 mL/min — ABNORMAL LOW (ref >60)
GFR calc non Af Amer: 30 mL/min — ABNORMAL LOW (ref >60)
GLUCOSE: 151 mg/dL — AB (ref 70–99)
Potassium: 5.2 mmol/L — ABNORMAL HIGH (ref 3.5–5.1)
SODIUM: 140 mmol/L (ref 135–145)

## 2018-02-17 LAB — IRON AND TIBC
Iron: 31 ug/dL (ref 28–170)
Saturation Ratios: 9 % — ABNORMAL LOW (ref 10.4–31.8)
TIBC: 346 ug/dL (ref 250–450)
UIBC: 315 ug/dL

## 2018-02-17 LAB — SODIUM, URINE, RANDOM: SODIUM UR: 56 mmol/L

## 2018-02-17 LAB — TROPONIN I: Troponin I: <0.03 ng/mL (ref <0.03)

## 2018-02-17 LAB — D-DIMER, QUANTITATIVE (NOT AT ARMC): D DIMER QUANT: 0.44 ug{FEU}/mL (ref 0.00–0.50)

## 2018-02-17 LAB — CREATININE, URINE, RANDOM: CREATININE, URINE: 48.54 mg/dL

## 2018-02-17 MED ORDER — SERTRALINE HCL 100 MG PO TABS
100.0000 mg | ORAL_TABLET | Freq: Every evening | ORAL | Status: DC
  Administered 2018-02-17: 100 mg via ORAL
  Filled 2018-02-17: qty 1

## 2018-02-17 MED ORDER — ONDANSETRON HCL 4 MG/2ML IJ SOLN: 4.0000 mg | Freq: Four times a day (QID) | INTRAMUSCULAR | Status: DC | PRN

## 2018-02-17 MED ORDER — ACETAMINOPHEN 650 MG RE SUPP: 650.0000 mg | Freq: Four times a day (QID) | RECTAL | Status: DC | PRN

## 2018-02-17 MED ORDER — GI COCKTAIL ~~LOC~~
30.0000 mL | Freq: Once | ORAL | Status: AC
  Administered 2018-02-17: 30 mL via ORAL
  Filled 2018-02-17: qty 30

## 2018-02-17 MED ORDER — ALUM & MAG HYDROXIDE-SIMETH 200-200-20 MG/5ML PO SUSP
30.0000 mL | Freq: Four times a day (QID) | ORAL | Status: DC | PRN
  Administered 2018-02-17 (×2): 30 mL via ORAL
  Filled 2018-02-17 (×2): qty 30

## 2018-02-17 MED ORDER — RISPERIDONE 1 MG PO TABS
1.0000 mg | ORAL_TABLET | Freq: Every day | ORAL | Status: DC
  Administered 2018-02-17: 1 mg via ORAL
  Filled 2018-02-17: qty 1

## 2018-02-17 MED ORDER — SIMVASTATIN 40 MG PO TABS
40.0000 mg | ORAL_TABLET | Freq: Every day | ORAL | Status: DC
  Administered 2018-02-17: 40 mg via ORAL
  Filled 2018-02-17: qty 1

## 2018-02-17 MED ORDER — TRAMADOL HCL 50 MG PO TABS
50.0000 mg | ORAL_TABLET | Freq: Three times a day (TID) | ORAL | Status: DC | PRN
Start: 2018-02-17 — End: 2018-02-18
  Administered 2018-02-17 (×2): 50 mg via ORAL
  Filled 2018-02-17 (×2): qty 1

## 2018-02-17 MED ORDER — CYCLOBENZAPRINE HCL 10 MG PO TABS
10.0000 mg | ORAL_TABLET | Freq: Once | ORAL | Status: AC
  Administered 2018-02-17: 10 mg via ORAL
  Filled 2018-02-17: qty 1

## 2018-02-17 MED ORDER — NICOTINE 21 MG/24HR TD PT24
21.0000 mg | MEDICATED_PATCH | Freq: Every day | TRANSDERMAL | Status: DC
  Administered 2018-02-17 – 2018-02-18 (×2): 21 mg via TRANSDERMAL
  Filled 2018-02-17 (×2): qty 1

## 2018-02-17 MED ORDER — HEPARIN SODIUM (PORCINE) 5000 UNIT/ML IJ SOLN
5000.0000 [IU] | Freq: Three times a day (TID) | INTRAMUSCULAR | Status: DC
  Administered 2018-02-17 – 2018-02-18 (×4): 5000 [IU] via SUBCUTANEOUS
  Filled 2018-02-17 (×4): qty 1

## 2018-02-17 MED ORDER — ONDANSETRON HCL 4 MG PO TABS: 4.0000 mg | ORAL_TABLET | Freq: Four times a day (QID) | ORAL | Status: DC | PRN

## 2018-02-17 MED ORDER — SODIUM CHLORIDE 0.9 % IV SOLN
INTRAVENOUS | Status: AC
  Administered 2018-02-17 (×2): via INTRAVENOUS

## 2018-02-17 MED ORDER — ALBUTEROL SULFATE (2.5 MG/3ML) 0.083% IN NEBU
2.5000 mg | INHALATION_SOLUTION | RESPIRATORY_TRACT | Status: DC | PRN
  Administered 2018-02-17 (×2): 2.5 mg via RESPIRATORY_TRACT
  Filled 2018-02-17 (×2): qty 3

## 2018-02-17 MED ORDER — PREGABALIN 50 MG PO CAPS: 50.0000 mg | ORAL_CAPSULE | Freq: Two times a day (BID) | ORAL | Status: DC

## 2018-02-17 MED ORDER — VENLAFAXINE HCL ER 37.5 MG PO CP24
37.5000 mg | ORAL_CAPSULE | Freq: Every day | ORAL | Status: DC
  Administered 2018-02-17: 37.5 mg via ORAL
  Filled 2018-02-17: qty 1

## 2018-02-17 MED ORDER — SODIUM CHLORIDE 0.9% FLUSH
3.0000 mL | Freq: Two times a day (BID) | INTRAVENOUS | Status: DC
  Administered 2018-02-17 – 2018-02-18 (×3): 3 mL via INTRAVENOUS

## 2018-02-17 MED ORDER — LATANOPROST 0.005 % OP SOLN
1.0000 [drp] | Freq: Every day | OPHTHALMIC | Status: DC
  Administered 2018-02-17: 1 [drp] via OPHTHALMIC
  Filled 2018-02-17: qty 2.5

## 2018-02-17 MED ORDER — HYDROCODONE-ACETAMINOPHEN 5-325 MG PO TABS
1.0000 | ORAL_TABLET | Freq: Once | ORAL | Status: AC
  Administered 2018-02-17: 1 via ORAL
  Filled 2018-02-17: qty 1

## 2018-02-17 MED ORDER — METHYLPREDNISOLONE SODIUM SUCC 125 MG IJ SOLR
60.0000 mg | Freq: Four times a day (QID) | INTRAMUSCULAR | Status: DC
  Administered 2018-02-17 – 2018-02-18 (×5): 60 mg via INTRAVENOUS
  Filled 2018-02-17 (×5): qty 2

## 2018-02-17 MED ORDER — MOMETASONE FURO-FORMOTEROL FUM 200-5 MCG/ACT IN AERO
2.0000 | INHALATION_SPRAY | Freq: Two times a day (BID) | RESPIRATORY_TRACT | Status: DC
  Administered 2018-02-17 – 2018-02-18 (×3): 2 via RESPIRATORY_TRACT
  Filled 2018-02-17: qty 8.8

## 2018-02-17 MED ORDER — TRAMADOL HCL 50 MG PO TABS: 50.0000 mg | ORAL_TABLET | Freq: Two times a day (BID) | ORAL | Status: DC | PRN

## 2018-02-17 MED ORDER — SODIUM CHLORIDE 0.9 % IV SOLN
500.0000 mg | INTRAVENOUS | Status: DC
  Administered 2018-02-17: 500 mg via INTRAVENOUS
  Filled 2018-02-17: qty 500

## 2018-02-17 MED ORDER — SENNOSIDES-DOCUSATE SODIUM 8.6-50 MG PO TABS: 1.0000 | ORAL_TABLET | Freq: Every evening | ORAL | Status: DC | PRN

## 2018-02-17 MED ORDER — ACETAMINOPHEN 325 MG PO TABS
650.0000 mg | ORAL_TABLET | Freq: Four times a day (QID) | ORAL | Status: DC | PRN
  Administered 2018-02-17: 650 mg via ORAL
  Filled 2018-02-17: qty 2

## 2018-02-17 MED ORDER — AZITHROMYCIN 250 MG PO TABS
500.0000 mg | ORAL_TABLET | Freq: Every day | ORAL | Status: DC
  Administered 2018-02-18: 500 mg via ORAL
  Filled 2018-02-17: qty 2

## 2018-02-17 MED ORDER — PREGABALIN 50 MG PO CAPS
50.0000 mg | ORAL_CAPSULE | Freq: Two times a day (BID) | ORAL | Status: DC
  Administered 2018-02-17 – 2018-02-18 (×3): 50 mg via ORAL
  Filled 2018-02-17 (×4): qty 1

## 2018-02-17 MED ORDER — PANTOPRAZOLE SODIUM 40 MG PO TBEC
40.0000 mg | DELAYED_RELEASE_TABLET | Freq: Every day | ORAL | Status: DC
  Administered 2018-02-17 – 2018-02-18 (×2): 40 mg via ORAL
  Filled 2018-02-17 (×2): qty 1

## 2018-02-17 NOTE — Progress Notes (Signed)
Patient c/o CP under the right breast, throbbing in nature and 9/10. VS and EKG obtained (see chart). Tramadol administered at 6pm. On call provider notified.

## 2018-02-17 NOTE — H&P (Signed)
History and Physical    Stephanie Benton:[id number] DOB: 05/17/1951 DOA: 02/16/2018  PCP: Verlon Au, MD   Patient coming from: Home  Chief Complaint: SOB, cough, wheezing, chest tightness   HPI: Stephanie Benton is a 67 y.o. female with medical history significant for COPD, hypertension, depression with anxiety, and chronic pain, now presenting to the emergency department with several days of progressive shortness of breath, wheezing, cough, and chest tightness.  Patient reports that the symptoms developed several days ago and have continued to progress despite her home inhalers.  She had a subjective fever last night.  Cough has been productive of thick sputum that is mainly clear.  She reports chest tightness bilaterally, worse with deep inspiration or cough.  She called EMS 2 nights ago, was treated in the field with nebs, but declined transportation to the ED at that time.  Symptoms progressing, she now comes in for evaluation.  ED Course: Upon arrival to the ED, patient is found to be afebrile, saturating well on room air, mildly tachypneic, and with vitals otherwise stable.  EKG features normal sinus rhythm and chest x-ray is negative for acute cardia pulmonary disease.  Chemistry panel is notable for creatinine of 2.26, up from 1.01 last November.  CBC features a normocytic anemia with hemoglobin of 10.8, down from 13.2 one year ago.  Troponin is undetectable and BNP is normal.  D-dimer is normal.  Patient was treated with nebs and 125 mg of IV Solu-Medrol in the ED.  She reports mild improvement with these measures, but continues to be dyspneic at rest and will be admitted for ongoing evaluation and management.  Review of Systems:  All other systems reviewed and apart from HPI, are negative.  Past Medical History:  Diagnosis Date  . Hyperlipidemia   . Hypertension     Past Surgical History:  Procedure Laterality Date  . ABDOMINAL HYSTERECTOMY    . APPENDECTOMY    .  HERNIA REPAIR    . TONSILLECTOMY    . TUBAL LIGATION       reports that she has been smoking cigarettes.  She has been smoking about 0.75 packs per day. She has never used smokeless tobacco. She reports that she does not drink alcohol or use drugs.  Allergies  Allergen Reactions  . Codeine Nausea And Vomiting  . Morphine And Related Swelling  . Nsaids Other (See Comments)    Stomach ulcer  History   . Penicillins Hives  . Sulfa Antibiotics Swelling    Family History  Problem Relation Age of Onset  . Heart failure Mother   . Cancer Father   . Cancer Sister      Prior to Admission medications   Medication Sig Start Date End Date Taking? Authorizing Provider  albuterol (PROVENTIL) (2.5 MG/3ML) 0.083% nebulizer solution Take 3 mLs (2.5 mg total) by nebulization every 6 (six) hours as needed for wheezing or shortness of breath. 11/27/14  Yes Elson Areas, PA-C  DEXILANT 60 MG capsule Take 60 mg by mouth every evening.  05/05/13  Yes [provider]  hydrochlorothiazide (HYDRODIURIL) 12.5 MG tablet Take 12.5 mg by mouth daily.  05/09/15  Yes [provider]  latanoprost (XALATAN) 0.005 % ophthalmic solution Place 1 drop into both eyes at bedtime. 02/16/18  Yes [provider]  LYRICA 100 MG capsule Take 100 mg by mouth 2 (two) times daily.  05/05/13  Yes [provider]  PROAIR HFA 108 (90 BASE) MCG/ACT inhaler Inhale  2 puffs into the lungs every 6 (six) hours as needed for wheezing or shortness of breath.  05/29/15  Yes [provider]  risperiDONE (RISPERDAL) 1 MG tablet Take 1 mg by mouth at bedtime.  11/24/14  Yes [provider]  sertraline (ZOLOFT) 100 MG tablet Take 100 mg by mouth every evening.  05/05/13  Yes [provider]  simvastatin (ZOCOR) 40 MG tablet Take 40 mg by mouth at bedtime.  05/09/15  Yes [provider]  SYMBICORT 160-4.5 MCG/ACT inhaler Inhale 1 puff into the lungs 2 (two) times daily. 01/28/18   Yes [provider]  telmisartan (MICARDIS) 80 MG tablet Take 80 mg by mouth every evening.  01/07/18  Yes [provider]  traMADol (ULTRAM) 50 MG tablet Take 50 mg by mouth every 6 (six) hours as needed for moderate pain.  02/16/18  Yes [provider]  venlafaxine XR (EFFEXOR-XR) 37.5 MG 24 hr capsule Take 37.5 mg by mouth at bedtime.  05/11/15  Yes [provider]    Physical Exam: Vitals:   02/17/18 0030 02/17/18 0100 02/17/18 0115 02/17/18 0214  BP: (!) 101/56 101/61 98/69   Pulse: 78 80 81 83  Resp: 16 17 19 20   Temp:      TempSrc:      SpO2: 100% 99% 98% 99%  Weight:      Height:          Constitutional: Not in acute distress, calm Eyes: PERTLA, lids and conjunctivae normal ENMT: Mucous membranes are moist. Posterior pharynx clear of any exudate or lesions.   Neck: normal, supple, no masses, no thyromegaly Respiratory: breath sounds slightly diminished bilaterally. Expiratory wheezes. Mild tachypnea. No accessory muscle use.  Cardiovascular: S1 & S2 heard, regular rate and rhythm. No extremity edema. Abdomen: No distension, no tenderness, soft. Bowel sounds normal.  Musculoskeletal: no clubbing / cyanosis. No joint deformity upper and lower extremities.  Skin: no significant rashes, lesions, ulcers. Warm, dry, well-perfused. Neurologic: No facial asymmetry. Sensation intact. Moving all extremities.  Psychiatric: Alert and oriented x 3. Calm, cooperative.     Labs on Admission: I have personally reviewed following labs and imaging studies  CBC: Recent Labs  Lab 02/16/18 2151  WBC 8.8  HGB 10.8*  HCT 34.0*  MCV 92.1  PLT 185   Basic Metabolic Panel: Recent Labs  Lab 02/16/18 2151  NA 135  K 5.1  CL 100  CO2 23  GLUCOSE 87  BUN 20  CREATININE 2.26*  CALCIUM 8.8*   GFR: Estimated Creatinine Clearance: 28.6 mL/min (A) (by C-G formula based on SCr of 2.26 mg/dL (H)). Liver Function Tests: No results for input(s): AST,  ALT, ALKPHOS, BILITOT, PROT, ALBUMIN in the last 168 hours. No results for input(s): LIPASE, AMYLASE in the last 168 hours. No results for input(s): AMMONIA in the last 168 hours. Coagulation Profile: No results for input(s): INR, PROTIME in the last 168 hours. Cardiac Enzymes: No results for input(s): CKTOTAL, CKMB, CKMBINDEX, TROPONINI in the last 168 hours. BNP (last 3 results) No results for input(s): PROBNP in the last 8760 hours. HbA1C: No results for input(s): HGBA1C in the last 72 hours. CBG: No results for input(s): GLUCAP in the last 168 hours. Lipid Profile: No results for input(s): CHOL, HDL, LDLCALC, TRIG, CHOLHDL, LDLDIRECT in the last 72 hours. Thyroid Function Tests: No results for input(s): TSH, T4TOTAL, FREET4, T3FREE, THYROIDAB in the last 72 hours. Anemia Panel: No results for input(s): VITAMINB12, FOLATE, FERRITIN, TIBC, IRON, RETICCTPCT  in the last 72 hours. Urine analysis:    Component Value Date/Time   COLORURINE YELLOW 01/11/2010 1841   APPEARANCEUR CLEAR 01/11/2010 1841   LABSPEC 1.006 01/11/2010 1841   PHURINE 6.5 01/11/2010 1841   GLUCOSEU NEGATIVE 01/11/2010 1841   HGBUR NEGATIVE 01/11/2010 1841   BILIRUBINUR NEGATIVE 01/11/2010 1841   KETONESUR NEGATIVE 01/11/2010 1841   PROTEINUR NEGATIVE 01/11/2010 1841   UROBILINOGEN 1.0 01/11/2010 1841   NITRITE NEGATIVE 01/11/2010 1841   LEUKOCYTESUR  01/11/2010 1841    NEGATIVE MICROSCOPIC NOT DONE ON URINES WITH NEGATIVE PROTEIN, BLOOD, LEUKOCYTES, NITRITE, OR GLUCOSE <1000 mg/dL.   Sepsis Labs: @LABRCNTIP (procalcitonin:4,lacticidven:4) )No results found for this or any previous visit (from the past 240 hour(s)).   Radiological Exams on Admission: Dg Chest 2 View  Result Date: 02/16/2018 CLINICAL DATA:  67 year old female with shortness of breath EXAM: CHEST - 2 VIEW COMPARISON:  Chest radiograph dated 06/05/2015 FINDINGS: The lungs are clear. There is no pleural effusion or pneumothorax. The cardiac  silhouette is within normal limits. There is a moderate sized hiatal hernia. No acute osseous pathology. IMPRESSION: No active cardiopulmonary disease. Electronically Signed   By: Elgie Collard M.D.   On: 02/16/2018 22:01    EKG: Independently reviewed. Sinus rhythm.   Assessment/Plan   1. COPD with acute exacerbation  - Presents with several days of progressive SOB, cough, and wheezing  - Reports some improvement with nebs and steroids in ED, but continues to be dyspneic at rest  - Check sputum culture, continue systemic steroid, start azithromycin, continue ICS/LABA, prn nebs    2. Renal insufficiency  - SCr is 2.26 on admission, up from 1.01 last November  - Uncertain chronicity; no recent vomiting or diarrhea, no change in appetite  - Check urine chemistries and renal US, renally-dose medications, hold HCTZ and ARB, start gentle IVF hydration, repeat chem panel in am   3. Chest discomfort  - No ischemic findings on EKG, CXR unremarkable, troponin undetectable, and d-dimer negative  - Likely secondary to frequent coughing and increased WOB  - Continue cardiac monitoring and serial troponin measurements    4. Normocytic anemia  - Hgb is 10.8 on admission, down from 13.2 one year earlier  - Denies melena or hematochezia  - Check anemia panel    5. Depression with anxiety  - Continue Zoloft, Effexor, and risperidone   6. Hypertension  - BP on the low side in ED  - HCTZ and ARB held on admission     DVT prophylaxis: sq heparin  Code Status: Full  Family Communication: Discussed with patient  Consults called: None Admission status: Inpatient     Briscoe Deutscher, MD [facility] Pager [phone]  If 7PM-7AM, please contact night-coverage [email or web address] Password [facility]  02/17/2018, 4:46 AM

## 2018-02-17 NOTE — ED Notes (Signed)
Attempted report 

## 2018-02-17 NOTE — ED Provider Notes (Signed)
[facility] EMERGENCY DEPARTMENT Provider Note   CSN: [id number] Arrival date & time: 02/16/18  1949     History   Chief Complaint Chief Complaint  Patient presents with  . Shortness of Breath    HPI Stephanie Benton is a 67 y.o. female.  The history is provided by the patient.  Shortness of Breath   She has history of hypertension and hyperlipidemia and comes in with 2-day history of chest pain and dyspnea.  She has had a cough productive of clear sputum.  She has had subjective fever as well as chills and sweats.  Chest pain is across the lower chest and does radiate to the back.  Is worse when she takes a deep breath.  She denies nausea or vomiting.  She has been using her home nebulizer which does give some temporary relief.  She has also noted some swelling of her legs with the left leg more swollen than the right.  She is a cigarette smoker.  Past Medical History:  Diagnosis Date  . Hyperlipidemia   . Hypertension     Patient Active Problem List   Diagnosis Date Noted  . COPD exacerbation (HCC) 05/18/2013    Past Surgical History:  Procedure Laterality Date  . ABDOMINAL HYSTERECTOMY    . APPENDECTOMY    . HERNIA REPAIR    . TONSILLECTOMY    . TUBAL LIGATION       OB History   None      Home Medications    Prior to Admission medications   Medication Sig Start Date End Date Taking? Authorizing Provider  albuterol (PROVENTIL) (2.5 MG/3ML) 0.083% nebulizer solution Take 3 mLs (2.5 mg total) by nebulization every 6 (six) hours as needed for wheezing or shortness of breath. 11/27/14   Elson Areas, PA-C  Aspirin-Salicylamide-Caffeine (BC HEADACHE POWDER PO) Take 1 packet by mouth 2 (two) times daily as needed (headache).    [provider]  DEXILANT 60 MG capsule Take 60 mg by mouth every evening.  05/05/13   [provider]  diazepam (VALIUM) 5 MG tablet Take 1 tablet (5 mg total) by mouth every 6 (six) hours as needed for  anxiety (spasms). 02/07/16   Charlynne Pander, MD  hydrochlorothiazide (HYDRODIURIL) 12.5 MG tablet Take 12.5 mg by mouth daily.  05/09/15   [provider]  LYRICA 100 MG capsule Take 100 mg by mouth 2 (two) times daily.  05/05/13   [provider]  oxyCODONE-acetaminophen (PERCOCET) 5-325 MG tablet Take 1-2 tablets by mouth every 6 (six) hours as needed. 02/07/16   Charlynne Pander, MD  predniSONE (DELTASONE) 10 MG tablet 6,5,4,3,2,1 taper Patient not taking: Reported on 06/05/2015 11/27/14   Osie Cheeks  [facility] HFA 108 (90 BASE) MCG/ACT inhaler Inhale 2 puffs into the lungs every 6 (six) hours as needed for wheezing or shortness of breath.  05/29/15   [provider]  risperiDONE (RISPERDAL) 1 MG tablet Take 1 mg by mouth at bedtime.  11/24/14   [provider]  sertraline (ZOLOFT) 100 MG tablet Take 100 mg by mouth every evening.  05/05/13   [provider]  simvastatin (ZOCOR) 40 MG tablet Take 40 mg by mouth at bedtime.  05/09/15   [provider]  valsartan (DIOVAN) 160 MG tablet Take 80 mg by mouth every evening.  05/05/13   [provider]  venlafaxine XR (EFFEXOR-XR) 37.5 MG 24 hr capsule Take 37.5 mg by mouth at  bedtime.  05/11/15   [provider]    Family History Family History  Problem Relation Age of Onset  . Heart failure Mother   . Cancer Father   . Cancer Sister     Social History Social History   Tobacco Use  . Smoking status: Current Every Day Smoker    Packs/day: 0.75    Types: Cigarettes  . Smokeless tobacco: Never Used  Substance Use Topics  . Alcohol use: No  . Drug use: No     Allergies   Codeine; Morphine and related; Nsaids; Penicillins; and Sulfa antibiotics   Review of Systems Review of Systems  Respiratory: Positive for shortness of breath.   All other systems reviewed and are negative.    Physical Exam Updated Vital Signs BP (!) 104/49 (BP Location: Right Arm)    Pulse 85   Temp 98.1 F (36.7 C) (Oral)   Resp (!) 21   Ht 5\' 5"  (1.651 m)   Wt 102.1 kg (225 lb)   SpO2 100%   BMI 37.44 kg/m   Physical Exam  Nursing note and vitals reviewed.  67 year old female, resting comfortably and in no acute distress. Vital signs are normal. Oxygen saturation is 100%, which is normal. Head is normocephalic and atraumatic. PERRLA, EOMI. Oropharynx is clear. Neck is nontender and supple without adenopathy or JVD. Back is nontender and there is no CVA tenderness. Lungs have coarse inspiratory and expiratory wheezes without rales or rhonchi. Chest is nontender. Heart has regular rate and rhythm without murmur. Abdomen is soft, flat, nontender without masses or hepatosplenomegaly and peristalsis is normoactive. Extremities have 1-2+ pretibial edema, full range of motion is present.  Of note, left calf circumference is 2 cm greater than right calf circumference.  There is no cord palpable and Homans sign is negative. Skin is warm and dry without rash. Neurologic: Mental status is normal, cranial nerves are intact, there are no motor or sensory deficits.  ED Treatments / Results  Labs (all labs ordered are listed, but only abnormal results are displayed) Labs Reviewed  CBC - Abnormal; Notable for the following components:      Result Value   RBC 3.69 (*)    Hemoglobin 10.8 (*)    HCT 34.0 (*)    All other components within normal limits  BASIC METABOLIC PANEL - Abnormal; Notable for the following components:   Creatinine, Ser 2.26 (*)    Calcium 8.8 (*)    GFR calc non Af Amer 21 (*)    GFR calc Af Amer 25 (*)    All other components within normal limits  BRAIN NATRIURETIC PEPTIDE  D-DIMER, QUANTITATIVE (NOT AT [facility])  I-STAT TROPONIN, ED    EKG EKG Interpretation  Date/Time:  Monday February 16 2018 21:16:24 EDT Ventricular Rate:  71 PR Interval:  174 QRS Duration: 76 QT Interval:  354 QTC Calculation: 384 R Axis:   50 Text Interpretation:   Normal sinus rhythm Normal ECG No significant change since last tracing Confirmed by Linwood Dibbles [id number]) on 02/16/2018 11:10:29 PM   Radiology Dg Chest 2 View  Result Date: 02/16/2018 CLINICAL DATA:  67 year old female with shortness of breath EXAM: CHEST - 2 VIEW COMPARISON:  Chest radiograph dated 06/05/2015 FINDINGS: The lungs are clear. There is no pleural effusion or pneumothorax. The cardiac silhouette is within normal limits. There is a moderate sized hiatal hernia. No acute osseous pathology. IMPRESSION: No active cardiopulmonary disease. Electronically Signed   By: Elgie Collard  M.D.   On: 02/16/2018 22:01    Procedures Procedures (including critical care time)  Medications Ordered in ED Medications  methylPREDNISolone sodium succinate (SOLU-MEDROL) 125 mg/2 mL injection 125 mg (has no administration in time range)  ipratropium-albuterol (DUONEB) 0.5-2.5 (3) MG/3ML nebulizer solution 3 mL (has no administration in time range)  albuterol (PROVENTIL) (2.5 MG/3ML) 0.083% nebulizer solution 5 mg (5 mg Nebulization Given 02/16/18 2119)     Initial Impression / Assessment and Plan / ED Course  I have reviewed the triage vital signs and the nursing notes.  Pertinent labs & imaging results that were available during my care of the patient were reviewed by me and considered in my medical decision making (see chart for details).  Chest pain and dyspnea which appear to be COPD/asthma exacerbation.  Old records are reviewed, and she does have prior ED visits for COPD and asthma exacerbation.  She had received albuterol nebulizer treatment and intravenous methylprednisolone prior to my seeing the patient.  She did state that she had some relief with that.  ECG is unremarkable and chest x-ray shows no evidence of pneumonia.  Labs had been drawn prior to my seeing the patient and troponin and BNP are normal.  Creatinine is elevated at 2.26 with last creatinine on record normal in October 2016.   Also, anemia is present with hemoglobin 10.8 with last prior hemoglobin on record 13.1 on June 05, 2015.  I looked for labs and care everywhere, and hemoglobin was 13.2 on March 04, 2017, creatinine 1.01 on July 04, 2017.  With pleuritic chest pain and asymmetric edema, I am concerned about possibility of pulmonary embolism.  She cannot have CT angiogram with elevated creatinine.  Will check d-dimer.  She will be given additional albuterol with ipratropium.  With apparent acute kidney injury and new onset anemia, I feel she will need to be admitted.  There was slight relief with nebulizer treatment. Case is discussed with Dr. Antionette Char of [facility], who agrees to admit the patient.  Final Clinical Impressions(s) / ED Diagnoses   Final diagnoses:  COPD exacerbation (HCC)  Acute kidney injury (nontraumatic) (HCC)  Normochromic normocytic anemia    ED Discharge Orders    None       Dione Booze, MD 02/17/18 [phone]

## 2018-02-17 NOTE — Progress Notes (Signed)
  PROGRESS NOTE    Stephanie Benton  GEX:[id number] DOB: 1950/10/14 DOA: 02/16/2018 PCP: Verlon Au, MD   Patient admitted by Dr Antionette Char overnight for treatment for acute COPD exacerbation and renal insufficiency.  She has been doing well on nebulizer treatments and azithromycin.  She is also getting IV fluids. This morning patient reports of feeling little better besides exertional shortness of breath.  I reviewed all of her labs which shows a creatinine level 2.26 to 1.72.  At this point we will continue frequent nebulizer treatments-scheduled and as needed, Solu-Medrol, Dulera.  Continue azithromycin as well.  Will add on incentive spirometry and flutter valve. Continue IV fluids at 100 cc/h to help with acute kidney injury.  Closely monitor her volume status. Advised to ambulate as much as possible today.  Please call with further questions if necessary.  Jaydalee Bardwell Joline Maxcy, MD [facility] Pager [phone]   If 7PM-7AM, please contact night-coverage [email or web address] Password [facility] 02/17/2018, 9:42 AM

## 2018-02-17 NOTE — Care Management Note (Signed)
Case Management Note  Patient Details  Name: Stephanie Benton MRN: [id number] Date of Birth: 05/27/1951  Subjective/Objective:   From home with family, presents with copd ex, renal insuff,anemia, depression with anxiety, htn                 Action/Plan: DC home when medically ready.  Expected Discharge Date:                  Expected Discharge Plan:  Home/Self Care  In-House Referral:     Discharge planning Services  CM Consult  Post Acute Care Choice:    Choice offered to:     DME Arranged:    DME Agency:     HH Arranged:    HH Agency:     Status of Service:  In process, will continue to follow  If discussed at Long Length of Stay Meetings, dates discussed:    Additional Comments:  Leone Haven, RN 02/17/2018, 9:48 AM

## 2018-02-18 DIAGNOSIS — N179 Acute kidney failure, unspecified: Secondary | ICD-10-CM | POA: Diagnosis not present

## 2018-02-18 DIAGNOSIS — E875 Hyperkalemia: Secondary | ICD-10-CM | POA: Diagnosis not present

## 2018-02-18 DIAGNOSIS — N2889 Other specified disorders of kidney and ureter: Secondary | ICD-10-CM | POA: Diagnosis not present

## 2018-02-18 DIAGNOSIS — D649 Anemia, unspecified: Secondary | ICD-10-CM | POA: Diagnosis not present

## 2018-02-18 DIAGNOSIS — J441 Chronic obstructive pulmonary disease with (acute) exacerbation: Secondary | ICD-10-CM | POA: Diagnosis not present

## 2018-02-18 DIAGNOSIS — Z716 Tobacco abuse counseling: Secondary | ICD-10-CM

## 2018-02-18 LAB — CBC
HEMATOCRIT: 31.3 % — AB (ref 36.0–46.0)
Hemoglobin: 9.9 g/dL — ABNORMAL LOW (ref 12.0–15.0)
MCH: 28.9 pg (ref 26.0–34.0)
MCHC: 31.6 g/dL (ref 30.0–36.0)
MCV: 91.3 fL (ref 78.0–100.0)
Platelets: 174 10*3/uL (ref 150–400)
RBC: 3.43 MIL/uL — AB (ref 3.87–5.11)
RDW: 14.6 % (ref 11.5–15.5)
WBC: 12.1 10*3/uL — ABNORMAL HIGH (ref 4.0–10.5)

## 2018-02-18 LAB — COMPREHENSIVE METABOLIC PANEL
ALBUMIN: 3.2 g/dL — AB (ref 3.5–5.0)
ALT: 19 U/L (ref 0–44)
AST: 16 U/L (ref 15–41)
Alkaline Phosphatase: 78 U/L (ref 38–126)
Anion gap: 7 (ref 5–15)
BILIRUBIN TOTAL: 0.8 mg/dL (ref 0.3–1.2)
BUN: 16 mg/dL (ref 8–23)
CO2: 22 mmol/L (ref 22–32)
Calcium: 9.1 mg/dL (ref 8.9–10.3)
Chloride: 112 mmol/L — ABNORMAL HIGH (ref 98–111)
Creatinine, Ser: 1.1 mg/dL — ABNORMAL HIGH (ref 0.44–1.00)
GFR calc Af Amer: 59 mL/min — ABNORMAL LOW (ref >60)
GFR calc non Af Amer: 51 mL/min — ABNORMAL LOW (ref >60)
GLUCOSE: 149 mg/dL — AB (ref 70–99)
Potassium: 5.2 mmol/L — ABNORMAL HIGH (ref 3.5–5.1)
SODIUM: 141 mmol/L (ref 135–145)
TOTAL PROTEIN: 6 g/dL — AB (ref 6.5–8.1)

## 2018-02-18 LAB — UREA NITROGEN, URINE: Urea Nitrogen, Ur: 309 mg/dL

## 2018-02-18 LAB — MAGNESIUM: Magnesium: 2.6 mg/dL — ABNORMAL HIGH (ref 1.7–2.4)

## 2018-02-18 MED ORDER — PREDNISONE 20 MG PO TABS: 40.0000 mg | ORAL_TABLET | Freq: Every day | ORAL | Status: DC

## 2018-02-18 MED ORDER — AZITHROMYCIN 250 MG PO TABS: 250.0000 mg | ORAL_TABLET | Freq: Every day | ORAL | 0 refills | Status: AC

## 2018-02-18 MED ORDER — PREDNISONE 20 MG PO TABS: 40.0000 mg | ORAL_TABLET | Freq: Every day | ORAL | 0 refills | Status: AC

## 2018-02-18 MED ORDER — NICOTINE 21 MG/24HR TD PT24: 21.0000 mg | MEDICATED_PATCH | Freq: Every day | TRANSDERMAL | 0 refills | Status: DC

## 2018-02-18 MED ORDER — SODIUM POLYSTYRENE SULFONATE 15 GM/60ML PO SUSP
30.0000 g | Freq: Once | ORAL | Status: AC
  Administered 2018-02-18: 30 g via ORAL
  Filled 2018-02-18: qty 120

## 2018-02-18 MED ORDER — PREDNISOLONE 5 MG PO TABS
40.0000 mg | ORAL_TABLET | Freq: Every day | ORAL | Status: DC
  Filled 2018-02-18: qty 8

## 2018-02-18 NOTE — Discharge Instructions (Signed)
Chronic Obstructive Pulmonary Disease Exacerbation Chronic obstructive pulmonary disease (COPD) is a long-term (chronic) condition that affects the lungs. COPD is a general term that can be used to describe many different lung problems that cause lung swelling (inflammation) and limit airflow, including chronic bronchitis and emphysema. COPD exacerbations are episodes when breathing symptoms become much worse and require extra treatment. COPD exacerbations are usually caused by infections. Without treatment, COPD exacerbations can be severe and even life threatening. Frequent COPD exacerbations can cause further damage to the lungs. What are the causes? This condition may be caused by:  Respiratory infections, including viral and bacterial infections.  Exposure to smoke.  Exposure to air pollution, chemical fumes, or dust.  Things that give you an allergic reaction (allergens).  Not taking your usual COPD medicines as directed.  Underlying medical problems, such as congestive heart failure or infections not involving the lungs.  In many cases, the cause (trigger) of this condition is not known. What increases the risk? The following factors may make you more likely to develop this condition:  Smoking cigarettes.  Old age.  Frequent prior COPD exacerbations.  What are the signs or symptoms? Symptoms of this condition include:  Increased coughing.  Increased production of mucus from your lungs (sputum).  Increased wheezing.  Increased shortness of breath.  Rapid or labored breathing.  Chest tightness.  Less energy than usual.  Sleep disruption from symptoms.  Confusion or increased sleepiness.  Often these symptoms happen or get worse even with the use of medicines. How is this diagnosed? This condition is diagnosed based on:  Your medical history.  A physical exam.  You may also have tests, including:  A chest X-ray.  Blood tests.  Lung (pulmonary)  function tests.  How is this treated? Treatment for this condition depends on the severity and cause of the symptoms. You may need to be admitted to a hospital for treatment. Some of the treatments commonly used to treat COPD exacerbations are:  Antibiotic medicines. These may be used for severe exacerbations caused by a lung infection, such as pneumonia.  Bronchodilators. These are inhaled medicines that expand the air passages and allow increased airflow.  Steroid medicines. These act to reduce inflammation in the airways. They may be given with an inhaler, taken by mouth, or given through an IV tube inserted into one of your veins.  Supplemental oxygen therapy.  Airway clearing techniques, such as noninvasive ventilation (NIV) and positive expiratory pressure (PEP). These provide respiratory support through a mask or other noninvasive device. An example of this would be using a continuous positive airway pressure (CPAP) machine to improve delivery of oxygen into your lungs.  Follow these instructions at home: Medicines  Take over-the-counter and prescription medicines only as told by your health care provider. It is important to use correct technique with inhaled medicines.  If you were prescribed an antibiotic medicine or oral steroid, take it as told by your health care provider. Do not stop taking the medicine even if you start to feel better. Lifestyle  Eat a healthy diet.  Exercise regularly.  Get plenty of sleep.  Avoid exposure to all substances that irritate the airway, especially to tobacco smoke.  Wash your hands often with soap and water to reduce the risk of infection. If soap and water are not available, use hand sanitizer.  During flu season, avoid enclosed spaces that are crowded with people. General instructions  Drink enough fluid to keep your urine clear or pale yellow (  unless you have a medical condition that requires fluid restriction).  Use a cool mist  vaporizer. This humidifies the air and makes it easier for you to clear your chest when you cough.  If you have a home nebulizer and oxygen, continue to use them as told by your health care provider.  Keep all follow-up visits as told by your health care provider. This is important. How is this prevented?  Stay up-to-date on pneumococcal and influenza (flu) vaccines. A flu shot is recommended every year to help prevent exacerbations.  Do not use any products that contain nicotine or tobacco, such as cigarettes and e-cigarettes. Quitting smoking is very important in preventing COPD from getting worse and in preventing exacerbations from happening as often. If you need help quitting, ask your health care provider.  Follow all instructions for pulmonary rehabilitation after a recent exacerbation. This can help prevent future exacerbations.  Work with your health care provider to develop and follow an action plan. This tells you what steps to take when you experience certain symptoms. Contact a health care provider if:  You have a worsening of your regular COPD symptoms. Get help right away if:  You have worsening shortness of breath, even when resting.  You have trouble talking.  You have severe chest pain.  You cough up blood.  You have a fever.  You have weakness, vomit repeatedly, or faint.  You feel confused.  You are not able to sleep because of your symptoms.  You have trouble doing daily activities. Summary  COPD exacerbations are episodes when breathing symptoms become much worse and require extra treatment above your normal treatment.  Exacerbations can be severe and even life threatening. Frequent COPD exacerbations can cause further damage to your lungs.  COPD exacerbations are usually triggered by infections such as the flu, colds, and even pneumonia.  Treatment for this condition depends on the severity and cause of the symptoms. You may need to be admitted to a  hospital for treatment.  Quitting smoking is very important to prevent COPD from getting worse and to prevent exacerbations from happening as often. This information is not intended to replace advice given to you by your health care provider. Make sure you discuss any questions you have with your health care provider. Document Released: 05/19/2007 Document Revised: 08/26/2016 Document Reviewed: 08/26/2016 Elsevier Interactive Patient Education  2018 [employer].   Acute Kidney Injury, Adult Acute kidney injury is a sudden worsening of kidney function. The kidneys are organs that have several jobs. They filter the blood to remove waste products and extra fluid. They also maintain a healthy balance of minerals and hormones in the body, which helps control blood pressure and keep bones strong. With this condition, your kidneys do not do their jobs as well as they should. This condition ranges from mild to severe. Over time it may develop into long-lasting (chronic) kidney disease. Early detection and treatment may prevent acute kidney injury from developing into a chronic condition. What are the causes? Common causes of this condition include:  A problem with blood flow to the kidneys. This may be caused by: ? Low blood pressure (hypotension) or shock. ? Blood loss. ? Heart and blood vessel (cardiovascular) disease. ? Severe burns. ? Liver disease.  Direct damage to the kidneys. This may be caused by: ? Certain medicines. ? A kidney infection. ? Poisoning. ? Being around or in contact with toxic substances. ? A surgical wound. ? A hard, direct hit to  the kidney area.  A sudden blockage of urine flow. This may be caused by: ? Cancer. ? Kidney stones. ? An enlarged prostate in males.  What are the signs or symptoms? Symptoms of this condition may not be obvious until the condition becomes severe. Symptoms of this condition can include:  Tiredness (lethargy), or difficulty staying  awake.  Nausea or vomiting.  Swelling (edema) of the face, legs, ankles, or feet.  Problems with urination, such as: ? Abdominal pain, or pain along the side of your stomach (flank). ? Decreased urine production. ? Decrease in the force of urine flow.  Muscle twitches and cramps, especially in the legs.  Confusion or trouble concentrating.  Loss of appetite.  Fever.  How is this diagnosed? This condition may be diagnosed with tests, including:  Blood tests.  Urine tests.  Imaging tests.  A test in which a sample of tissue is removed from the kidneys to be examined under a microscope (kidney biopsy).  How is this treated? Treatment for this condition depends on the cause and how severe the condition is. In mild cases, treatment may not be needed. The kidneys may heal on their own. In more severe cases, treatment will involve:  Treating the cause of the kidney injury. This may involve changing any medicines you are taking or adjusting your dosage.  Fluids. You may need specialized IV fluids to balance your body's needs.  Having a catheter placed to drain urine and prevent blockages.  Preventing problems from occurring. This may mean avoiding certain medicines or procedures that can cause further injury to the kidneys.  In some cases treatment may also require:  A procedure to remove toxic wastes from the body (dialysis or continuous renal replacement therapy - CRRT).  Surgery. This may be done to repair a torn kidney, or to remove the blockage from the urinary system.  Follow these instructions at home: Medicines  Take over-the-counter and prescription medicines only as told by your health care provider.  Do not take any new medicines without your health care provider's approval. Many medicines can worsen your kidney damage.  Do not take any vitamin and mineral supplements without your health care provider's approval. Many nutritional supplements can worsen your  kidney damage. Lifestyle  If your health care provider prescribed changes to your diet, follow them. You may need to decrease the amount of protein you eat.  Achieve and maintain a healthy weight. If you need help with this, ask your health care provider.  Start or continue an exercise plan. Try to exercise at least 30 minutes a day, 5 days a week.  Do not use any tobacco products, such as cigarettes, chewing tobacco, and e-cigarettes. If you need help quitting, ask your health care provider. General instructions  Keep track of your blood pressure. Report changes in your blood pressure as told by your health care provider.  Stay up to date with immunizations. Ask your health care provider which immunizations you need.  Keep all follow-up visits as told by your health care provider. This is important. Where to find more information:  American Association of Kidney Patients: ResidentialShow.is  [employer]: www.kidney.org  American Kid[employer]: FightingMatch.com.ee  Life Options Rehabilitation Program: ? www.lifeoptions.org ? www.kidneyschool.org Contact a health care provider if:  Your symptoms get worse.  You develop new symptoms. Get help right away if:  You develop symptoms of worsening kidney disease, which include: ? Headaches. ? Abnormally dark or light skin. ? Easy bruising. ?  Frequent hiccups. ? Chest pain. ? Shortness of breath. ? End of menstruation in women. ? Seizures. ? Confusion or altered mental status. ? Abdominal or back pain. ? Itchiness.  You have a fever.  Your body is producing less urine.  You have pain or bleeding when you urinate. Summary  Acute kidney injury is a sudden worsening of kidney function.  Acute kidney injury can be caused by problems with blood flow to the kidneys, direct damage to the kidneys, and sudden blockage of urine flow.  Symptoms of this condition may not be obvious until it becomes severe. Symptoms may include  edema, lethargy, confusion, nausea or vomiting, and problems passing urine.  This condition can usually be diagnosed with blood tests, urine tests, and imaging tests. Sometimes a kidney biopsy is done to diagnose this condition.  Treatment for this condition often involves treating the underlying cause. It is treated with fluids, medicines, dialysis, diet changes, or surgery. This information is not intended to replace advice given to you by your health care provider. Make sure you discuss any questions you have with your health care provider. Document Released: 02/04/2011 Document Revised: 11/21/2016 Document Reviewed: 07/12/2016 Elsevier Interactive Patient Education  [employer].

## 2018-02-18 NOTE — Progress Notes (Signed)
Patient discharged home in care of family. No concerns at this time. Patient eager to leave.

## 2018-02-18 NOTE — Plan of Care (Signed)
Discussed plan of care with patient.  Emphasized the importance of using her call bell for assistance before attempting to ambulate to the bathroom from the bed.  Good teach back was displayed.

## 2018-02-18 NOTE — Discharge Summary (Signed)
Physician Discharge Summary  Stephanie Benton WUJ:[id number] DOB: 07-31-1951 DOA: 02/16/2018  PCP: Verlon Au, MD  Admit date: 02/16/2018 Discharge date: 02/18/2018  Admitted From: Home Disposition:  Home  Recommendations for Outpatient Follow-up:  1. Follow up with PCP in 1 week.  Please check renal function and if stable may place her back on her ARB and HCTZ for her blood pressure. 2. Please also obtain a CT with IV contrast or MRI to evaluate the masslike area in the midpole of the left kidney seen on ultrasound (once renal function remains stable).  Home Health: None Equipment/Devices: None  Discharge Condition: Fair CODE STATUS: Full code Diet recommendation: Low-sodium   Discharge Diagnoses:  Principal Problem:   COPD with acute exacerbation (HCC)   Active Problems:   Chest pain, atypical Essential hypertension   Depression with anxiety   AKI (acute kidney injury) (HCC)   Normochromic normocytic anemia   Tobacco abuse counseling   Hyperkalemia Renal masslike lesion  Brief narrative/HPI Please refer to admission H&P for details, in brief, with history of COPD with ongoing tobacco use, hypertension, anxiety and depression, chronic pain who presented to the ED with several days of progressive shortness of breath with wheezing, cough and chest tightness.  Symptoms unimproved with home inhalers and also has subjective fever on the night prior to admission.  She was having productive thick sputum with chest tightness worsened on deep inspiration cough. In the ED vitals were stable.  Chest x-ray negative for acute findings.  Blood work showed acute kidney injury with creatinine of 2.26 and normocytic anemia with hemoglobin of 10.8 (worse from 13.2 about 1 year ago). Patient given nebs and breathing treatment with some improvement and admitted to hospital service.  Hospital course Acute exacerbation of COPD secondary to acute bronchitis Placed on IV Solu-Medrol,  scheduled nebs and empiric azithromycin.  Supportive care with antitussives. Symptoms markedly improved.  Maintaining sats on room air.  Patient feels back to her normal. I will discharge her on oral prednisone for 5-day course and azithromycin to complete 5-day course as well.  Continue home inhalers. Counseled strongly on smoking cessation and nicotine patch prescribed.  Active problems Acute kidney injury Suspect prerenal with dehydration.  Improved with IV fluids.  ARB and HCTZ have been held.  She also has elevated potassium this morning and given Kayexalate. Renal function improving close to baseline.  Blood pressure has remained stable so I will hold her ARB and HCTZ upon discharge and have her follow-up with her PCP next week to check her renal function.  Hyperkalemia Given a dose of Kayexalate prior to discharge.  Normocytic anemia Noted for dropping hemoglobin to 10 from 13.12 years ago.  Has low normal iron level with low saturation.  Also has low normal B12 (255).  Denies melena or hematochezia.  Patient should benefit from being on iron and B12 supplement as outpatient.  Letter H&H as outpatient.  Essential hypertension Blood pressure has remained stable.  Holding ARB and HCTZ given her AKI and hyperkalemia and repeat labs as outpatient next week.  Tobacco abuse Counseled strongly on cessation and prescribe nicotine patch.  Anxiety and depression Continue Zoloft, Effexor and risperidone  Chest pain, atypical EKG unremarkable, troponin and d-dimer negative.  Likely pleuritic with cough and shortness of breath.  Now resolved.  Suspicious left midpole renal mass on ultrasound. This is recommended to be followed up with CT with IV contrast versus MRI to rule out a solid mass.  Given her impaired  renal function this was not performed in the hospital and should be done early as outpatient if renal function is stable during follow-up.   Disposition: Home Family communication:  None at bedside  Consults: None Procedure: Renal ultrasound   Discharge Instructions   Allergies as of 02/18/2018      Reactions   Codeine Nausea And Vomiting   Morphine And Related Swelling   Nsaids Other (See Comments)   Stomach ulcer  History    Penicillins Hives   Sulfa Antibiotics Swelling      Medication List    STOP taking these medications   hydrochlorothiazide 12.5 MG tablet Commonly known as:  HYDRODIURIL   telmisartan 80 MG tablet Commonly known as:  MICARDIS     TAKE these medications   albuterol (2.5 MG/3ML) 0.083% nebulizer solution Commonly known as:  PROVENTIL Take 3 mLs (2.5 mg total) by nebulization every 6 (six) hours as needed for wheezing or shortness of breath.   PROAIR HFA 108 (90 Base) MCG/ACT inhaler Generic drug:  albuterol Inhale 2 puffs into the lungs every 6 (six) hours as needed for wheezing or shortness of breath.   azithromycin 250 MG tablet Commonly known as:  ZITHROMAX Take 1 tablet (250 mg total) by mouth daily for 4 days. D   DEXILANT 60 MG capsule Generic drug:  dexlansoprazole Take 60 mg by mouth every evening.   latanoprost 0.005 % ophthalmic solution Commonly known as:  XALATAN Place 1 drop into both eyes at bedtime.   LYRICA 100 MG capsule Generic drug:  pregabalin Take 100 mg by mouth 2 (two) times daily.   nicotine 21 mg/24hr patch Commonly known as:  NICODERM CQ - dosed in mg/24 hours Place 1 patch (21 mg total) onto the skin daily. Start taking on:  02/19/2018   predniSONE 20 MG tablet Commonly known as:  DELTASONE Take 2 tablets (40 mg total) by mouth daily with breakfast for 5 days. Start taking on:  02/19/2018   risperiDONE 1 MG tablet Commonly known as:  RISPERDAL Take 1 mg by mouth at bedtime.   sertraline 100 MG tablet Commonly known as:  ZOLOFT Take 100 mg by mouth every evening.   simvastatin 40 MG tablet Commonly known as:  ZOCOR Take 40 mg by mouth at bedtime.   SYMBICORT 160-4.5 MCG/ACT  inhaler Generic drug:  budesonide-formoterol Inhale 1 puff into the lungs 2 (two) times daily.   traMADol 50 MG tablet Commonly known as:  ULTRAM Take 50 mg by mouth every 6 (six) hours as needed for moderate pain.   venlafaxine XR 37.5 MG 24 hr capsule Commonly known as:  EFFEXOR-XR Take 37.5 mg by mouth at bedtime.      Follow-up Information    Verlon Au, MD. Schedule an appointment as soon as possible for a visit in 1 week(s).   Specialty:  Family Medicine Contact information: [address] [place] [address] [phone]          Allergies  Allergen Reactions  . Codeine Nausea And Vomiting  . Morphine And Related Swelling  . Nsaids Other (See Comments)    Stomach ulcer  History   . Penicillins Hives  . Sulfa Antibiotics Swelling        Procedures/Studies: Dg Chest 2 View  Result Date: 02/16/2018 CLINICAL DATA:  67 year old female with shortness of breath EXAM: CHEST - 2 VIEW COMPARISON:  Chest radiograph dated 06/05/2015 FINDINGS: The lungs are clear. There is no pleural effusion or  pneumothorax. The cardiac silhouette is within normal limits. There is a moderate sized hiatal hernia. No acute osseous pathology. IMPRESSION: No active cardiopulmonary disease. Electronically Signed   By: Elgie Collard M.D.   On: 02/16/2018 22:01   US Renal  Result Date: 02/17/2018 CLINICAL DATA:  68 year old female with acute renal insufficiency. EXAM: RENAL / URINARY TRACT ULTRASOUND COMPLETE COMPARISON:  None. FINDINGS: Right Kidney: Length: 10.2 cm. The right kidney is unremarkable. No hydronephrosis or shadowing stone. Left Kidney: Length: 9.0 cm. 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 A solid mass is not entirely excluded. Further evaluation with CT with contrast or MRI on a nonemergent basis recommended. No hydronephrosis  or shadowing stone. Bladder: Partially distended. The bladder wall appears somewhat irregular, likely with trabeculation. IMPRESSION: 1. No hydronephrosis or shadowing stone. 2. 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 Further evaluation with CT with IV contrast or MRI recommended to exclude a solid mass or neoplasm. Electronically Signed   By: Elgie Collard M.D.   On: 02/17/2018 05:47       Subjective: Denies further shortness of breath or cough.  Feels back to her normal.  Discharge Exam: Vitals:   02/18/18 0754 02/18/18 0810  BP:  (!) 124/51  Pulse:  76  Resp:  18  Temp:  97.8 F (36.6 C)  SpO2: 96% 99%   Vitals:   02/17/18 2300 02/18/18 0500 02/18/18 0754 02/18/18 0810  BP: (!) 156/55   (!) 124/51  Pulse: 90   76  Resp: 20   18  Temp: (!) 97.5 F (36.4 C)   97.8 F (36.6 C)  TempSrc: Oral   Oral  SpO2: 99%  96% 99%  Weight:  105.8 kg (233 lb 4 oz)    Height:        General: Not in distress HEENT: Moist mucosa, supple neck Chest: Clear to auscultation bilaterally CVS: Normal S1-S2, no murmurs GI: Soft, nondistended, bowel sounds present, nontender Musculoskeletal: Warm, no edema     The results of significant diagnostics from this hospitalization (including imaging, microbiology, ancillary and laboratory) are listed below for reference.     Microbiology: No results found for this or any previous visit (from the past 240 hour(s)).   Labs: BNP (last 3 results) Recent Labs    02/16/18 2151  BNP 6.1   Basic Metabolic Panel: Recent Labs  Lab 02/16/18 2151 02/17/18 0736 02/18/18 0324  NA 135 140 141  K 5.1 5.2* 5.2*  CL 100 110 112*  CO2 23 21* 22  GLUCOSE 87 151* 149*  BUN 20 18 16   CREATININE 2.26* 1.72* 1.10*  CALCIUM 8.8* 9.1 9.1  MG  --   --  2.6*   Liver Function Tests: Recent Labs  Lab 02/18/18 0324  AST 16  ALT 19  ALKPHOS 78  BILITOT 0.8  PROT 6.0*  ALBUMIN 3.2*   No results for  input(s): LIPASE, AMYLASE in the last 168 hours. No results for input(s): AMMONIA in the last 168 hours. CBC: Recent Labs  Lab 02/16/18 2151 02/18/18 0324  WBC 8.8 12.1*  HGB 10.8* 9.9*  HCT 34.0* 31.3*  MCV 92.1 91.3  PLT 185 174   Cardiac Enzymes: Recent Labs  Lab 02/17/18 0439 02/17/18 0736 02/17/18 1405  TROPONINI <0.03 <0.03 <0.03   BNP: Invalid input(s):  POCBNP CBG: No results for input(s): GLUCAP in the last 168 hours. D-Dimer Recent Labs    02/16/18 2151  DDIMER 0.44   Hgb A1c No results for input(s): HGBA1C in the last 72 hours. Lipid Profile No results for input(s): CHOL, HDL, LDLCALC, TRIG, CHOLHDL, LDLDIRECT in the last 72 hours. Thyroid function studies No results for input(s): TSH, T4TOTAL, T3FREE, THYROIDAB in the last 72 hours.  Invalid input(s): FREET3 Anemia work up Recent Labs    02/17/18 0439  VITAMINB12 255  FOLATE 5.9*  FERRITIN 89  TIBC 346  IRON 31  RETICCTPCT 1.3   Urinalysis    Component Value Date/Time   COLORURINE YELLOW 01/11/2010 1841   APPEARANCEUR CLEAR 01/11/2010 1841   LABSPEC 1.006 01/11/2010 1841   PHURINE 6.5 01/11/2010 1841   GLUCOSEU NEGATIVE 01/11/2010 1841   HGBUR NEGATIVE 01/11/2010 1841   BILIRUBINUR NEGATIVE 01/11/2010 1841   KETONESUR NEGATIVE 01/11/2010 1841   PROTEINUR NEGATIVE 01/11/2010 1841   UROBILINOGEN 1.0 01/11/2010 1841   NITRITE NEGATIVE 01/11/2010 1841   LEUKOCYTESUR  01/11/2010 1841    NEGATIVE MICROSCOPIC NOT DONE ON URINES WITH NEGATIVE PROTEIN, BLOOD, LEUKOCYTES, NITRITE, OR GLUCOSE <1000 mg/dL.   Sepsis Labs Invalid input(s): PROCALCITONIN,  WBC,  LACTICIDVEN Microbiology No results found for this or any previous visit (from the past 240 hour(s)).   Total time spent: <30 minutes  SIGNED:   Eddie North, MD  [facility] 02/18/2018, 8:45 AM Pager   If 7PM-7AM, please contact night-coverage [email or web address] Password [email or web address]

## 2018-03-25 DIAGNOSIS — G8929 Other chronic pain: Secondary | ICD-10-CM | POA: Insufficient documentation

## 2018-04-02 ENCOUNTER — Other Ambulatory Visit: Payer: Self-pay | Admitting: Podiatry

## 2018-04-02 ENCOUNTER — Encounter: Payer: Self-pay | Admitting: Podiatry

## 2018-04-02 ENCOUNTER — Ambulatory Visit (INDEPENDENT_AMBULATORY_CARE_PROVIDER_SITE_OTHER): Payer: Medicare HMO

## 2018-04-02 ENCOUNTER — Ambulatory Visit (INDEPENDENT_AMBULATORY_CARE_PROVIDER_SITE_OTHER): Payer: Medicare HMO | Admitting: Podiatry

## 2018-04-02 VITALS — BP 146/76 | HR 78

## 2018-04-02 DIAGNOSIS — M2012 Hallux valgus (acquired), left foot: Secondary | ICD-10-CM | POA: Diagnosis not present

## 2018-04-02 DIAGNOSIS — M722 Plantar fascial fibromatosis: Secondary | ICD-10-CM | POA: Diagnosis not present

## 2018-04-02 DIAGNOSIS — M21611 Bunion of right foot: Secondary | ICD-10-CM

## 2018-04-02 DIAGNOSIS — M2011 Hallux valgus (acquired), right foot: Secondary | ICD-10-CM | POA: Diagnosis not present

## 2018-04-02 DIAGNOSIS — G5763 Lesion of plantar nerve, bilateral lower limbs: Secondary | ICD-10-CM

## 2018-04-02 DIAGNOSIS — M2041 Other hammer toe(s) (acquired), right foot: Secondary | ICD-10-CM

## 2018-04-02 DIAGNOSIS — M2042 Other hammer toe(s) (acquired), left foot: Secondary | ICD-10-CM

## 2018-04-02 DIAGNOSIS — M792 Neuralgia and neuritis, unspecified: Secondary | ICD-10-CM

## 2018-04-02 DIAGNOSIS — M79671 Pain in right foot: Secondary | ICD-10-CM

## 2018-04-02 DIAGNOSIS — M21612 Bunion of left foot: Secondary | ICD-10-CM

## 2018-04-02 DIAGNOSIS — M79672 Pain in left foot: Principal | ICD-10-CM

## 2018-04-02 DIAGNOSIS — G609 Hereditary and idiopathic neuropathy, unspecified: Secondary | ICD-10-CM

## 2018-04-02 NOTE — Patient Instructions (Signed)
Morton Neuralgia Morton neuralgia is a type of foot pain in the area closest to your toes. This area is sometimes called the ball of your foot. Morton neuralgia occurs when a branch of a nerve in your foot (digital nerve) becomes compressed. When this happens over a long period of time, the nerve can thicken (neuroma) and cause pain. This usually occurs between the third and fourth toe. Morton neuralgia can come and go but may get worse over time. What are the causes? Your digital nerve can become compressed and stretched at a point where it passes under a thick band of tissue that connects your toes (intermetatarsal ligament). Morton neuralgia can be caused by mild repetitive damage in this area. This type of damage can result from:  Activities such as running or jumping.  Wearing shoes that are too tight.  What increases the risk? You may be at risk for Morton neuralgia if you:  Are female.  Wear high heels.  Wear shoes that are narrow or tight.  Participate in activities that stretch your toes. These include: ? Running. ? Ballet. ? Long-distance walking.  What are the signs or symptoms? The first symptom of Morton neuralgia is pain that spreads from the ball of your foot to your toes. It may feel like you are walking on a marble. Pain usually gets worse with walking and goes away at night. Other symptoms may include numbness and cramping of your toes. How is this diagnosed? Your health care provider will do a physical exam. When doing the exam, your health care provider may:  Squeeze your foot just behind your toe.  Ask you to move your toes to check for pain.  You may also have tests on your foot to confirm the diagnosis. These may include:  An X-ray.  An MRI.  How is this treated? Treatment for Morton neuralgia may be as simple as changing the kind of shoes you wear. Other treatments may include:  Wearing a supportive pad (orthosis) under the front of your foot. This  lifts your toe bones and takes pressure off the nerve.  Getting injections of numbing medicine and anti-inflammatory medicine (steroid) in the nerve.  Having surgery to remove part of the thickened nerve.  Follow these instructions at home:  Take medicine only as directed by your health care provider.  Wear soft-soled shoes with a wide toe area.  Stop activities that may be causing pain.  Elevate your foot when resting.  Massage your foot.  Apply ice to the injured area: ? Put ice in a plastic bag. ? Place a towel between your skin and the bag. ? Leave the ice on for 20 minutes, 2-3 times a day.  Keep all follow-up visits as directed by your health care provider. This is important. Contact a health care provider if:  Home care instructions are not helping you get better.  Your symptoms change or get worse. This information is not intended to replace advice given to you by your health care provider. Make sure you discuss any questions you have with your health care provider. Document Released: 10/28/2000 Document Revised: 12/28/2015 Document Reviewed: 09/22/2013 Elsevier Interactive Patient Education  2018 [employer]  

## 2018-04-03 NOTE — Progress Notes (Signed)
Subjective:  Patient ID: Stephanie Benton, female    DOB: Sep 15, 1950,  MRN: [id number]  Chief Complaint  Patient presents with  . Foot Pain    bilateral dorsal, base of toes; pt stated, "pain has been on and off for years; was diagnosed with neuropathy back in 2006 but not diabetic"   67 y.o. female presents with the above complaint.  Reports pain to the balls of both feet.  Present for several years.  Diagnosed with neuropathy takes Lyrica and says it does help.  Denies diabetes.  Denies alcohol use.  States that she has had a history of low back issues but never told what she has neuropathy.  Review of Systems: Negative except as noted in the HPI. Denies N/V/F/Ch.  Past Medical History:  Diagnosis Date  . Hyperlipidemia   . Hypertension     Current Outpatient Medications:  .  albuterol (PROVENTIL) (2.5 MG/3ML) 0.083% nebulizer solution, Take 3 mLs (2.5 mg total) by nebulization every 6 (six) hours as needed for wheezing or shortness of breath., Disp: 75 mL, Rfl: 12 .  DEXILANT 60 MG capsule, Take 60 mg by mouth every evening. , Disp: , Rfl:  .  latanoprost (XALATAN) 0.005 % ophthalmic solution, Place 1 drop into both eyes at bedtime., Disp: , Rfl:  .  LYRICA 100 MG capsule, Take 100 mg by mouth 2 (two) times daily. , Disp: , Rfl:  .  nicotine (NICODERM CQ - DOSED IN MG/24 HOURS) 21 mg/24hr patch, Place 1 patch (21 mg total) onto the skin daily., Disp: 28 patch, Rfl: 0 .  PROAIR HFA 108 (90 BASE) MCG/ACT inhaler, Inhale 2 puffs into the lungs every 6 (six) hours as needed for wheezing or shortness of breath. , Disp: , Rfl:  .  risperiDONE (RISPERDAL) 1 MG tablet, Take 1 mg by mouth at bedtime. , Disp: , Rfl:  .  sertraline (ZOLOFT) 100 MG tablet, Take 100 mg by mouth every evening. , Disp: , Rfl:  .  simvastatin (ZOCOR) 40 MG tablet, Take 40 mg by mouth at bedtime. , Disp: , Rfl:  .  SYMBICORT 160-4.5 MCG/ACT inhaler, Inhale 1 puff into the lungs 2 (two) times daily., Disp: , Rfl:  .   traMADol (ULTRAM) 50 MG tablet, Take 50 mg by mouth every 6 (six) hours as needed for moderate pain. , Disp: , Rfl:  .  venlafaxine XR (EFFEXOR-XR) 37.5 MG 24 hr capsule, Take 37.5 mg by mouth at bedtime. , Disp: , Rfl:   Social History   Tobacco Use  Smoking Status Current Every Day Smoker  . Packs/day: 0.75  . Types: Cigarettes  Smokeless Tobacco Never Used    Allergies  Allergen Reactions  . Codeine Nausea And Vomiting  . Morphine And Related Swelling  . Nsaids Other (See Comments)    Stomach ulcer  History   . Penicillins Hives  . Sulfa Antibiotics Swelling  . Tolmetin     Stomach ulcer  History    Objective:   Vitals:   04/02/18 1353  BP: (!) 146/76  Pulse: 78   There is no height or weight on file to calculate BMI. Constitutional Well developed. Well nourished.  Vascular Dorsalis pedis pulses palpable bilaterally. Posterior tibial pulses palpable bilaterally. Capillary refill normal to all digits.  No cyanosis or clubbing noted. Pedal hair growth normal.  Neurologic Normal speech. Oriented to person, place, and time. Epicritic sensation to light touch grossly present bilaterally.  Dermatologic Nails well groomed and normal in appearance.  No open wounds. No skin lesions.  Orthopedic: Normal joint ROM without pain or crepitus bilaterally. HAV deformity bilateral, lesser digital contractures bilateral Pain motion about the third interspace bilaterally pain palpation about the second MPJs bilaterally   Radiographs: HAV deformity with early arthritic changes.  No acute fractures dislocations.  Hammertoe deformities Assessment:   1. Morton's neuroma of both feet   2. Hammer toes of both feet   3. Hallux valgus with bunions of left foot   4. Hallux valgus with bunions of right foot   5. Neuralgia and neuritis   6. Neuropathy, idiopathic    Plan:  Patient was evaluated and treated and all questions answered.  Morton's neuroma bilaterally -Educated  etiology -Injection delivered to both third interspace as below -Padding dispensed  Procedure: Neuroma Injection Location: Bilateral 3rd interspace Skin Prep: Alcohol. Injectate: 0.5 cc 0.5% marcaine plain, 0.5 cc dexamethasone phosphate. Disposition: Patient tolerated procedure well. Injection site dressed with a band-aid.  Idiopathic peripheral -Radiculopathy most likely cause  Return in about 3 weeks (around 04/23/2018) for Neuroma, Bilateral.

## 2018-04-23 ENCOUNTER — Ambulatory Visit (INDEPENDENT_AMBULATORY_CARE_PROVIDER_SITE_OTHER): Payer: Medicare HMO | Admitting: Podiatry

## 2018-04-23 DIAGNOSIS — B353 Tinea pedis: Secondary | ICD-10-CM | POA: Diagnosis not present

## 2018-04-23 DIAGNOSIS — G5763 Lesion of plantar nerve, bilateral lower limbs: Secondary | ICD-10-CM

## 2018-04-23 MED ORDER — KETOCONAZOLE 2 % EX CREA
TOPICAL_CREAM | CUTANEOUS | 0 refills | Status: DC
Start: 2018-04-23 — End: 2022-05-16

## 2018-04-23 NOTE — Addendum Note (Signed)
Addended by: Ventura Sellers on: 04/23/2018 01:26 PM   Modules accepted: Orders, Level of Service

## 2018-04-23 NOTE — Progress Notes (Addendum)
Subjective:  Patient ID: Stephanie Benton, female    DOB: [birth year],  MRN: [id number]  Chief Complaint  Patient presents with  . Neuroma    bilateral 3 week follow up   67 y.o. female presents with the above complaint.  States the injections helped but only lasted for 2 days. Denies new issues.  Review of Systems: Negative except as noted in the HPI. Denies N/V/F/Ch.  Past Medical History:  Diagnosis Date  . Hyperlipidemia   . Hypertension     Current Outpatient Medications:  .  albuterol (PROVENTIL) (2.5 MG/3ML) 0.083% nebulizer solution, Take 3 mLs (2.5 mg total) by nebulization every 6 (six) hours as needed for wheezing or shortness of breath., Disp: 75 mL, Rfl: 12 .  DEXILANT 60 MG capsule, Take 60 mg by mouth every evening. , Disp: , Rfl:  .  ketoconazole (NIZORAL) 2 % cream, Apply 1 fingertip amount to each foot daily., Disp: 30 g, Rfl: 0 .  latanoprost (XALATAN) 0.005 % ophthalmic solution, Place 1 drop into both eyes at bedtime., Disp: , Rfl:  .  LYRICA 100 MG capsule, Take 100 mg by mouth 2 (two) times daily. , Disp: , Rfl:  .  nicotine (NICODERM CQ - DOSED IN MG/24 HOURS) 21 mg/24hr patch, Place 1 patch (21 mg total) onto the skin daily., Disp: 28 patch, Rfl: 0 .  PROAIR HFA 108 (90 BASE) MCG/ACT inhaler, Inhale 2 puffs into the lungs every 6 (six) hours as needed for wheezing or shortness of breath. , Disp: , Rfl:  .  risperiDONE (RISPERDAL) 1 MG tablet, Take 1 mg by mouth at bedtime. , Disp: , Rfl:  .  sertraline (ZOLOFT) 100 MG tablet, Take 100 mg by mouth every evening. , Disp: , Rfl:  .  simvastatin (ZOCOR) 40 MG tablet, Take 40 mg by mouth at bedtime. , Disp: , Rfl:  .  SYMBICORT 160-4.5 MCG/ACT inhaler, Inhale 1 puff into the lungs 2 (two) times daily., Disp: , Rfl:  .  traMADol (ULTRAM) 50 MG tablet, Take 50 mg by mouth every 6 (six) hours as needed for moderate pain. , Disp: , Rfl:  .  venlafaxine XR (EFFEXOR-XR) 37.5 MG 24 hr capsule, Take 37.5 mg by mouth at  bedtime. , Disp: , Rfl:   Social History   Tobacco Use  Smoking Status Current Every Day Smoker  . Packs/day: 0.75  . Types: Cigarettes  Smokeless Tobacco Never Used    Allergies  Allergen Reactions  . Codeine Nausea And Vomiting  . Morphine And Related Swelling  . Nsaids Other (See Comments)    Stomach ulcer  History   . Penicillins Hives  . Sulfa Antibiotics Swelling  . Tolmetin     Stomach ulcer  History    Objective:   There were no vitals filed for this visit. There is no height or weight on file to calculate BMI. Constitutional Well developed. Well nourished.  Vascular Dorsalis pedis pulses palpable bilaterally. Posterior tibial pulses palpable bilaterally. Capillary refill normal to all digits.  No cyanosis or clubbing noted. Pedal hair growth normal.  Neurologic Normal speech. Oriented to person, place, and time. Epicritic sensation to light touch grossly present bilaterally.  Dermatologic Nails well groomed and normal in appearance. No open wounds. Xerosis with scaling plantar foot bilat.  Orthopedic: Normal joint ROM without pain or crepitus bilaterally. HAV deformity bilateral, lesser digital contractures bilateral Pain motion about the third interspace bilaterally pain palpation about the second MPJs bilaterally   Assessment:  1. Morton's neuroma of both feet   2. Tinea pedis of both feet    Plan:  Patient was evaluated and treated and all questions answered.  Morton's neuroma bilaterally -Improved slightly after injection but didn't last long. -Injection #2 delivered to both third interspace as below  Procedure: Neuroma Injection Location: Bilateral 3rd interspace Skin Prep: Alcohol. Injectate: 0.5 cc 0.5% marcaine plain, 0.5 cc dexamethasone phosphate. Disposition: Patient tolerated procedure well. Injection site dressed with a band-aid.  Tinea Pedis -Rx Ketoconazole.  Return in about 3 weeks (around 05/14/2018) for Neuroma.

## 2018-05-08 DIAGNOSIS — F5105 Insomnia due to other mental disorder: Secondary | ICD-10-CM | POA: Insufficient documentation

## 2018-05-12 DIAGNOSIS — M1712 Unilateral primary osteoarthritis, left knee: Secondary | ICD-10-CM | POA: Insufficient documentation

## 2018-05-14 ENCOUNTER — Ambulatory Visit (INDEPENDENT_AMBULATORY_CARE_PROVIDER_SITE_OTHER): Payer: Medicare HMO | Admitting: Podiatry

## 2018-05-14 DIAGNOSIS — G5763 Lesion of plantar nerve, bilateral lower limbs: Secondary | ICD-10-CM

## 2018-05-14 NOTE — Progress Notes (Signed)
Subjective:  Patient ID: Stephanie Benton, female    DOB: 1951/04/28,  MRN: [id number]  Chief Complaint  Patient presents with  . Foot Problem    Cramping in all toes both feet.   . Neuroma    3wk f/u on mortons neuroma bilateral. Pt states injections help, but after previous injection had sharp shooting pains in both feet.  . Foot Problem    Tinea pedis f/u. Pt states cream is helping.   67 y.o. female presents with the above complaint.  Thinks overall the injections are helping.  Reports cramping in the toes of both feet.  Review of Systems: Negative except as noted in the HPI. Denies N/V/F/Ch.  Past Medical History:  Diagnosis Date  . Hyperlipidemia   . Hypertension     Current Outpatient Medications:  .  amLODipine (NORVASC) 2.5 MG tablet, TAKE 1 TABLET(2.5 MG) BY MOUTH DAILY, Disp: , Rfl:  .  diclofenac sodium (VOLTAREN) 1 % GEL, Apply topically., Disp: , Rfl:  .  albuterol (PROVENTIL) (2.5 MG/3ML) 0.083% nebulizer solution, Take 3 mLs (2.5 mg total) by nebulization every 6 (six) hours as needed for wheezing or shortness of breath., Disp: 75 mL, Rfl: 12 .  BELSOMRA 10 MG TABS, , Disp: , Rfl:  .  DEXILANT 60 MG capsule, Take 60 mg by mouth every evening. , Disp: , Rfl:  .  ketoconazole (NIZORAL) 2 % cream, Apply 1 fingertip amount to each foot daily., Disp: 30 g, Rfl: 0 .  latanoprost (XALATAN) 0.005 % ophthalmic solution, Place 1 drop into both eyes at bedtime., Disp: , Rfl:  .  LYRICA 100 MG capsule, Take 100 mg by mouth 2 (two) times daily. , Disp: , Rfl:  .  nicotine (NICODERM CQ - DOSED IN MG/24 HOURS) 21 mg/24hr patch, Place 1 patch (21 mg total) onto the skin daily., Disp: 28 patch, Rfl: 0 .  PROAIR HFA 108 (90 BASE) MCG/ACT inhaler, Inhale 2 puffs into the lungs every 6 (six) hours as needed for wheezing or shortness of breath. , Disp: , Rfl:  .  risperiDONE (RISPERDAL) 1 MG tablet, Take 1 mg by mouth at bedtime. , Disp: , Rfl:  .  sertraline (ZOLOFT) 100 MG tablet, Take  100 mg by mouth every evening. , Disp: , Rfl:  .  simvastatin (ZOCOR) 40 MG tablet, Take 40 mg by mouth at bedtime. , Disp: , Rfl:  .  SYMBICORT 160-4.5 MCG/ACT inhaler, Inhale 1 puff into the lungs 2 (two) times daily., Disp: , Rfl:  .  traMADol (ULTRAM) 50 MG tablet, Take 50 mg by mouth every 6 (six) hours as needed for moderate pain. , Disp: , Rfl:  .  venlafaxine XR (EFFEXOR-XR) 37.5 MG 24 hr capsule, Take 37.5 mg by mouth at bedtime. , Disp: , Rfl:   Social History   Tobacco Use  Smoking Status Current Every Day Smoker  . Packs/day: 0.75  . Types: Cigarettes  Smokeless Tobacco Never Used    Allergies  Allergen Reactions  . Codeine Nausea And Vomiting  . Morphine And Related Swelling  . Nsaids Other (See Comments)    Stomach ulcer  History  Stomach ulcer  History   . Penicillins Hives  . Sulfa Antibiotics Swelling  . Tolmetin     Stomach ulcer  History    Objective:   There were no vitals filed for this visit. There is no height or weight on file to calculate BMI. Constitutional Well developed. Well nourished.  Vascular Dorsalis pedis  pulses palpable bilaterally. Posterior tibial pulses palpable bilaterally. Capillary refill normal to all digits.  No cyanosis or clubbing noted. Pedal hair growth normal.  Neurologic Normal speech. Oriented to person, place, and time. Epicritic sensation to light touch grossly present bilaterally.  Dermatologic Nails well groomed and normal in appearance. No open wounds. Xerosis with scaling plantar foot bilat.  Orthopedic: Normal joint ROM without pain or crepitus bilaterally. HAV deformity bilateral, lesser digital contractures bilateral Pain motion about the third interspace bilaterally   Assessment:   1. Morton's neuroma of both feet    Plan:  Patient was evaluated and treated and all questions answered.  Morton's neuroma bilaterally -Improving status post injection -Injection #3 delivered as below  Procedure: Neuroma  Injection Location: Bilateral 3rd interspace Skin Prep: Alcohol. Injectate: 0.5 cc 0.5% marcaine plain, 0.5 cc dexamethasone phosphate. Disposition: Patient tolerated procedure well. Injection site dressed with a band-aid.   Return in about 6 weeks (around 06/25/2018) for Neuroma, Bilateral.

## 2018-06-25 ENCOUNTER — Encounter: Payer: Self-pay | Admitting: Podiatry

## 2018-06-25 ENCOUNTER — Ambulatory Visit (INDEPENDENT_AMBULATORY_CARE_PROVIDER_SITE_OTHER): Payer: Medicare HMO | Admitting: Podiatry

## 2018-06-25 DIAGNOSIS — G5763 Lesion of plantar nerve, bilateral lower limbs: Secondary | ICD-10-CM

## 2018-06-28 NOTE — Progress Notes (Signed)
Subjective:  Patient ID: Stephanie Benton, female    DOB: 1951-05-03,  MRN: [id number]  Chief Complaint  Patient presents with  . Foot Pain    Follow up neuroma 3rd interspace bilateral   "The shots really helped"   67 y.o. female presents with the above complaint.  States the shots really have seem to help but the area is not completely better  Review of Systems: Negative except as noted in the HPI. Denies N/V/F/Ch.  Past Medical History:  Diagnosis Date  . Hyperlipidemia   . Hypertension     Current Outpatient Medications:  .  albuterol (PROVENTIL) (2.5 MG/3ML) 0.083% nebulizer solution, Take 3 mLs (2.5 mg total) by nebulization every 6 (six) hours as needed for wheezing or shortness of breath., Disp: 75 mL, Rfl: 12 .  amLODipine (NORVASC) 2.5 MG tablet, TAKE 1 TABLET(2.5 MG) BY MOUTH DAILY, Disp: , Rfl:  .  BELSOMRA 10 MG TABS, , Disp: , Rfl:  .  DEXILANT 60 MG capsule, Take 60 mg by mouth every evening. , Disp: , Rfl:  .  diclofenac sodium (VOLTAREN) 1 % GEL, Apply topically., Disp: , Rfl:  .  ketoconazole (NIZORAL) 2 % cream, Apply 1 fingertip amount to each foot daily., Disp: 30 g, Rfl: 0 .  latanoprost (XALATAN) 0.005 % ophthalmic solution, Place 1 drop into both eyes at bedtime., Disp: , Rfl:  .  LYRICA 100 MG capsule, Take 100 mg by mouth 2 (two) times daily. , Disp: , Rfl:  .  nicotine (NICODERM CQ - DOSED IN MG/24 HOURS) 21 mg/24hr patch, Place 1 patch (21 mg total) onto the skin daily., Disp: 28 patch, Rfl: 0 .  PROAIR HFA 108 (90 BASE) MCG/ACT inhaler, Inhale 2 puffs into the lungs every 6 (six) hours as needed for wheezing or shortness of breath. , Disp: , Rfl:  .  risperiDONE (RISPERDAL) 1 MG tablet, Take 1 mg by mouth at bedtime. , Disp: , Rfl:  .  sertraline (ZOLOFT) 100 MG tablet, Take 100 mg by mouth every evening. , Disp: , Rfl:  .  simvastatin (ZOCOR) 40 MG tablet, Take 40 mg by mouth at bedtime. , Disp: , Rfl:  .  SPIRIVA RESPIMAT 2.5 MCG/ACT AERS, INL 2 PFS ITL  D, Disp: , Rfl: 11 .  SYMBICORT 160-4.5 MCG/ACT inhaler, Inhale 1 puff into the lungs 2 (two) times daily., Disp: , Rfl:  .  telmisartan (MICARDIS) 80 MG tablet, , Disp: , Rfl:  .  traMADol (ULTRAM) 50 MG tablet, Take 50 mg by mouth every 6 (six) hours as needed for moderate pain. , Disp: , Rfl:  .  venlafaxine XR (EFFEXOR-XR) 37.5 MG 24 hr capsule, Take 37.5 mg by mouth at bedtime. , Disp: , Rfl:   Social History   Tobacco Use  Smoking Status Current Every Day Smoker  . Packs/day: 0.75  . Types: Cigarettes  Smokeless Tobacco Never Used    Allergies  Allergen Reactions  . Codeine Nausea And Vomiting  . Morphine And Related Swelling  . Nsaids Other (See Comments)    Stomach ulcer  History  Stomach ulcer  History   . Penicillins Hives  . Sulfa Antibiotics Swelling  . Tolmetin     Stomach ulcer  History    Objective:   There were no vitals filed for this visit. There is no height or weight on file to calculate BMI. Constitutional Well developed. Well nourished.  Vascular Dorsalis pedis pulses palpable bilaterally. Posterior tibial pulses palpable bilaterally. Capillary  refill normal to all digits.  No cyanosis or clubbing noted. Pedal hair growth normal.  Neurologic Normal speech. Oriented to person, place, and time. Epicritic sensation to light touch grossly present bilaterally.  Dermatologic Nails well groomed and normal in appearance. No open wounds. Xerosis with scaling plantar foot bilat.  Orthopedic: Normal joint ROM without pain or crepitus bilaterally. HAV deformity bilateral, lesser digital contractures bilateral Pain motion about the third interspace bilaterally   Assessment:   1. Morton's neuroma of both feet    Plan:  Patient was evaluated and treated and all questions answered.  Morton's neuroma bilaterally -Improving status post injection, maxed out steroid injections for now -Commence sclerosing injection therapy injection #1 today  Procedure:  Sclerosing Nerve Injection Location: Bilateral 3rd interspace Skin Prep: Alcohol. Injectate: 1.5 cc 4% sclerosing alcohol injection Disposition: Patient tolerated procedure well. Injection site dressed with a band-aid.  Return in about 3 weeks (around 07/16/2018) for Neuroma, Bilateral.

## 2018-07-23 ENCOUNTER — Ambulatory Visit: Payer: Medicare HMO | Admitting: Podiatry

## 2018-08-06 ENCOUNTER — Encounter: Payer: Self-pay | Admitting: Podiatry

## 2018-08-06 ENCOUNTER — Ambulatory Visit (INDEPENDENT_AMBULATORY_CARE_PROVIDER_SITE_OTHER): Payer: Medicare HMO | Admitting: Podiatry

## 2018-08-06 DIAGNOSIS — G5763 Lesion of plantar nerve, bilateral lower limbs: Secondary | ICD-10-CM

## 2018-08-06 DIAGNOSIS — M2042 Other hammer toe(s) (acquired), left foot: Secondary | ICD-10-CM

## 2018-08-06 DIAGNOSIS — M205X2 Other deformities of toe(s) (acquired), left foot: Secondary | ICD-10-CM | POA: Diagnosis not present

## 2018-08-06 DIAGNOSIS — Z720 Tobacco use: Secondary | ICD-10-CM | POA: Diagnosis not present

## 2018-08-06 NOTE — Progress Notes (Signed)
Subjective:  Patient ID: Stephanie Benton, female    DOB: October 13, 1950,  MRN: [id number]  Chief Complaint  Patient presents with  . Neuroma    Follow-up; Bilateral; pt stated, "My feet feel worse; the injection helped for a week last time"  . Painful lesion    Left foot; 4th toe-lateral side; x1 month; pt stated, "It hurts to walk on my foot; the ball of my foot hurts"   68 y.o. female presents with the above complaint.  Review of Systems: Negative except as noted in the HPI. Denies N/V/F/Ch.  Past Medical History:  Diagnosis Date  . Hyperlipidemia   . Hypertension     Current Outpatient Medications:  .  albuterol (PROVENTIL) (2.5 MG/3ML) 0.083% nebulizer solution, Take 3 mLs (2.5 mg total) by nebulization every 6 (six) hours as needed for wheezing or shortness of breath., Disp: 75 mL, Rfl: 12 .  amLODipine (NORVASC) 2.5 MG tablet, TAKE 1 TABLET(2.5 MG) BY MOUTH DAILY, Disp: , Rfl:  .  BELSOMRA 10 MG TABS, , Disp: , Rfl:  .  busPIRone (BUSPAR) 7.5 MG tablet, Take by mouth., Disp: , Rfl:  .  DEXILANT 60 MG capsule, Take 60 mg by mouth every evening. , Disp: , Rfl:  .  diclofenac sodium (VOLTAREN) 1 % GEL, Apply topically., Disp: , Rfl:  .  ketoconazole (NIZORAL) 2 % cream, Apply 1 fingertip amount to each foot daily., Disp: 30 g, Rfl: 0 .  latanoprost (XALATAN) 0.005 % ophthalmic solution, Place 1 drop into both eyes at bedtime., Disp: , Rfl:  .  LYRICA 100 MG capsule, Take 100 mg by mouth 2 (two) times daily. , Disp: , Rfl:  .  nicotine (NICODERM CQ - DOSED IN MG/24 HOURS) 21 mg/24hr patch, Place 1 patch (21 mg total) onto the skin daily., Disp: 28 patch, Rfl: 0 .  PROAIR HFA 108 (90 BASE) MCG/ACT inhaler, Inhale 2 puffs into the lungs every 6 (six) hours as needed for wheezing or shortness of breath. , Disp: , Rfl:  .  risperiDONE (RISPERDAL) 1 MG tablet, Take 1 mg by mouth at bedtime. , Disp: , Rfl:  .  sertraline (ZOLOFT) 100 MG tablet, Take 100 mg by mouth every evening. , Disp: ,  Rfl:  .  simvastatin (ZOCOR) 40 MG tablet, Take 40 mg by mouth at bedtime. , Disp: , Rfl:  .  SPIRIVA RESPIMAT 2.5 MCG/ACT AERS, INL 2 PFS ITL D, Disp: , Rfl: 11 .  SYMBICORT 160-4.5 MCG/ACT inhaler, Inhale 1 puff into the lungs 2 (two) times daily., Disp: , Rfl:  .  telmisartan (MICARDIS) 80 MG tablet, , Disp: , Rfl:  .  traMADol (ULTRAM) 50 MG tablet, Take 50 mg by mouth every 6 (six) hours as needed for moderate pain. , Disp: , Rfl:  .  venlafaxine XR (EFFEXOR-XR) 37.5 MG 24 hr capsule, Take 37.5 mg by mouth at bedtime. , Disp: , Rfl:   Social History   Tobacco Use  Smoking Status Current Every Day Smoker  . Packs/day: 0.75  . Types: Cigarettes  Smokeless Tobacco Never Used    Allergies  Allergen Reactions  . Codeine Nausea And Vomiting  . Morphine And Related Swelling  . Nsaids Other (See Comments)    Stomach ulcer  History  Stomach ulcer  History   . Penicillins Hives  . Sulfa Antibiotics Swelling  . Tolmetin     Stomach ulcer  History    Objective:   There were no vitals filed for this  visit. There is no height or weight on file to calculate BMI. Constitutional Well developed. Well nourished.  Vascular Dorsalis pedis pulses palpable bilaterally. Posterior tibial pulses palpable bilaterally. Capillary refill normal to all digits.  No cyanosis or clubbing noted. Pedal hair growth normal.  Neurologic Normal speech. Oriented to person, place, and time. Epicritic sensation to light touch grossly present bilaterally.  Dermatologic Nails well groomed and normal in appearance. No open wounds. Xerosis with scaling plantar foot bilat.  Orthopedic: Normal joint ROM without pain or crepitus bilaterally. HAV deformity bilateral, lesser digital contractures bilateral Pain motion about the third interspace bilaterally   Assessment:   1. Morton's neuroma of both feet   2. Hammertoe of left foot   3. Acquired adductovarus rotation of toe, left   4. Tobacco abuse    Plan:   Patient was evaluated and treated and all questions answered.  Morton's neuroma bilaterally -Improving status post injection, maxed out steroid injections for now -Commence sclerosing injection therapy injection #2 today  Procedure: Neurolysis Location: Bilateral 3rd interspace Skin Prep: Alcohol. Injectate: 4% alcohol sclerosing injection. Disposition: Patient tolerated procedure well. Injection site dressed with a band-aid.  Hammertoes Left 4th/5th Toes -Discussed possible surgical correction to alleviate her painful corn. -Avoid surgery until tobacco free. Pt verbalized understanding -Will further discuss at next visit  Return in about 3 weeks (around 08/27/2018) for sclerosing injecitons bilat.

## 2018-08-27 ENCOUNTER — Ambulatory Visit: Payer: Medicare HMO | Admitting: Podiatry

## 2018-09-03 ENCOUNTER — Ambulatory Visit (INDEPENDENT_AMBULATORY_CARE_PROVIDER_SITE_OTHER): Payer: Medicare HMO | Admitting: Podiatry

## 2018-09-03 DIAGNOSIS — G5763 Lesion of plantar nerve, bilateral lower limbs: Secondary | ICD-10-CM | POA: Diagnosis not present

## 2018-09-03 DIAGNOSIS — Z720 Tobacco use: Secondary | ICD-10-CM | POA: Diagnosis not present

## 2018-09-03 DIAGNOSIS — M205X2 Other deformities of toe(s) (acquired), left foot: Secondary | ICD-10-CM | POA: Diagnosis not present

## 2018-09-03 DIAGNOSIS — M2042 Other hammer toe(s) (acquired), left foot: Secondary | ICD-10-CM | POA: Diagnosis not present

## 2018-09-03 NOTE — Progress Notes (Signed)
Subjective:  Patient ID: Stephanie Benton, female    DOB: Jul 22, 1951,  MRN: [id number]  Chief Complaint  Patient presents with  . Foot Problem    sclerosing injecitons bilat   68 y.o. female presents with the above complaint.  States that injections are helping she notices a difference when she does not have the injection.  Still having significant pain in the hammertoes and is trying to quit smoking.  Review of Systems: Negative except as noted in the HPI. Denies N/V/F/Ch.  Past Medical History:  Diagnosis Date  . Hyperlipidemia   . Hypertension     Current Outpatient Medications:  .  albuterol (PROVENTIL) (2.5 MG/3ML) 0.083% nebulizer solution, Take 3 mLs (2.5 mg total) by nebulization every 6 (six) hours as needed for wheezing or shortness of breath., Disp: 75 mL, Rfl: 12 .  amLODipine (NORVASC) 2.5 MG tablet, TAKE 1 TABLET(2.5 MG) BY MOUTH DAILY, Disp: , Rfl:  .  BELSOMRA 10 MG TABS, , Disp: , Rfl:  .  busPIRone (BUSPAR) 7.5 MG tablet, Take by mouth., Disp: , Rfl:  .  DEXILANT 60 MG capsule, Take 60 mg by mouth every evening. , Disp: , Rfl:  .  diclofenac sodium (VOLTAREN) 1 % GEL, Apply topically., Disp: , Rfl:  .  ketoconazole (NIZORAL) 2 % cream, Apply 1 fingertip amount to each foot daily., Disp: 30 g, Rfl: 0 .  latanoprost (XALATAN) 0.005 % ophthalmic solution, Place 1 drop into both eyes at bedtime., Disp: , Rfl:  .  LYRICA 100 MG capsule, Take 100 mg by mouth 2 (two) times daily. , Disp: , Rfl:  .  nicotine (NICODERM CQ - DOSED IN MG/24 HOURS) 21 mg/24hr patch, Place 1 patch (21 mg total) onto the skin daily., Disp: 28 patch, Rfl: 0 .  PROAIR HFA 108 (90 BASE) MCG/ACT inhaler, Inhale 2 puffs into the lungs every 6 (six) hours as needed for wheezing or shortness of breath. , Disp: , Rfl:  .  risperiDONE (RISPERDAL) 1 MG tablet, Take 1 mg by mouth at bedtime. , Disp: , Rfl:  .  sertraline (ZOLOFT) 100 MG tablet, Take 100 mg by mouth every evening. , Disp: , Rfl:  .   simvastatin (ZOCOR) 40 MG tablet, Take 40 mg by mouth at bedtime. , Disp: , Rfl:  .  SPIRIVA RESPIMAT 2.5 MCG/ACT AERS, INL 2 PFS ITL D, Disp: , Rfl: 11 .  SYMBICORT 160-4.5 MCG/ACT inhaler, Inhale 1 puff into the lungs 2 (two) times daily., Disp: , Rfl:  .  telmisartan (MICARDIS) 80 MG tablet, , Disp: , Rfl:  .  traMADol (ULTRAM) 50 MG tablet, Take 50 mg by mouth every 6 (six) hours as needed for moderate pain. , Disp: , Rfl:  .  venlafaxine XR (EFFEXOR-XR) 37.5 MG 24 hr capsule, Take 37.5 mg by mouth at bedtime. , Disp: , Rfl:   Social History   Tobacco Use  Smoking Status Current Every Day Smoker  . Packs/day: 0.75  . Types: Cigarettes  Smokeless Tobacco Never Used    Allergies  Allergen Reactions  . Codeine Nausea And Vomiting  . Morphine And Related Swelling  . Nsaids Other (See Comments)    Stomach ulcer  History  Stomach ulcer  History   . Penicillins Hives  . Sulfa Antibiotics Swelling  . Tolmetin     Stomach ulcer  History    Objective:   There were no vitals filed for this visit. There is no height or weight on file to  calculate BMI. Constitutional Well developed. Well nourished.  Vascular Dorsalis pedis pulses palpable bilaterally. Posterior tibial pulses palpable bilaterally. Capillary refill normal to all digits.  No cyanosis or clubbing noted. Pedal hair growth normal.  Neurologic Normal speech. Oriented to person, place, and time. Epicritic sensation to light touch grossly present bilaterally.  Dermatologic Nails well groomed and normal in appearance. No open wounds. Xerosis with scaling plantar foot bilat.  Orthopedic: Normal joint ROM without pain or crepitus bilaterally. HAV deformity bilateral, lesser digital contractures bilateral Pain motion about the third interspace bilaterally   Assessment:   1. Morton's neuroma of both feet   2. Hammertoe of left foot   3. Acquired adductovarus rotation of toe, left   4. Tobacco abuse    Plan:  Patient  was evaluated and treated and all questions answered.  Morton's neuroma bilaterally -Continue sclerosing injection therapy injection #3 today  Procedure: Sclerosing Nerve Injection Location: Bilateral 3rd interspace Skin Prep: Alcohol. Injectate: 1.5 cc 4% sclerosing alcohol injection Disposition: Patient tolerated procedure well. Injection site dressed with a band-aid.  Hammertoes Left 4th/5th Toes -Again discussed possible surgical correction with patient will hold off until patient is tobacco free.  Patient verbalized understanding.  Silicone toe spacer dispensed educated on use.  Again educated etiology deformity.  Return in about 4 weeks (around 10/01/2018) for Sclerosing injections bilat.

## 2018-10-01 ENCOUNTER — Encounter: Payer: Self-pay | Admitting: Podiatry

## 2018-10-01 ENCOUNTER — Ambulatory Visit (INDEPENDENT_AMBULATORY_CARE_PROVIDER_SITE_OTHER): Payer: Medicare HMO | Admitting: Podiatry

## 2018-10-01 DIAGNOSIS — G5782 Other specified mononeuropathies of left lower limb: Secondary | ICD-10-CM

## 2018-10-01 DIAGNOSIS — G5762 Lesion of plantar nerve, left lower limb: Secondary | ICD-10-CM | POA: Diagnosis not present

## 2018-10-01 DIAGNOSIS — G5761 Lesion of plantar nerve, right lower limb: Secondary | ICD-10-CM | POA: Diagnosis not present

## 2018-10-01 DIAGNOSIS — G5781 Other specified mononeuropathies of right lower limb: Secondary | ICD-10-CM

## 2018-10-01 NOTE — Progress Notes (Signed)
Subjective:  Patient ID: Stephanie Benton, female    DOB: 07-Jun-1951,  MRN: [id number]  Chief Complaint  Patient presents with  . Foot Pain    Follow up neuroma 3rd interspace bilateral   "The injections help, but it comes back after a bit"   68 y.o. female presents with the above complaint.  States that the injections came back but she thinks that the time between them was a little too long and her pain has come back.  Review of Systems: Negative except as noted in the HPI. Denies N/V/F/Ch.  Past Medical History:  Diagnosis Date  . Hyperlipidemia   . Hypertension     Current Outpatient Medications:  .  albuterol (PROVENTIL) (2.5 MG/3ML) 0.083% nebulizer solution, Take 3 mLs (2.5 mg total) by nebulization every 6 (six) hours as needed for wheezing or shortness of breath., Disp: 75 mL, Rfl: 12 .  amLODipine (NORVASC) 2.5 MG tablet, TAKE 1 TABLET(2.5 MG) BY MOUTH DAILY, Disp: , Rfl:  .  BELSOMRA 10 MG TABS, , Disp: , Rfl:  .  busPIRone (BUSPAR) 7.5 MG tablet, Take by mouth., Disp: , Rfl:  .  DEXILANT 60 MG capsule, Take 60 mg by mouth every evening. , Disp: , Rfl:  .  diclofenac sodium (VOLTAREN) 1 % GEL, Apply topically., Disp: , Rfl:  .  ketoconazole (NIZORAL) 2 % cream, Apply 1 fingertip amount to each foot daily., Disp: 30 g, Rfl: 0 .  latanoprost (XALATAN) 0.005 % ophthalmic solution, Place 1 drop into both eyes at bedtime., Disp: , Rfl:  .  LYRICA 100 MG capsule, Take 100 mg by mouth 2 (two) times daily. , Disp: , Rfl:  .  nicotine (NICODERM CQ - DOSED IN MG/24 HOURS) 21 mg/24hr patch, Place 1 patch (21 mg total) onto the skin daily., Disp: 28 patch, Rfl: 0 .  PROAIR HFA 108 (90 BASE) MCG/ACT inhaler, Inhale 2 puffs into the lungs every 6 (six) hours as needed for wheezing or shortness of breath. , Disp: , Rfl:  .  risperiDONE (RISPERDAL) 1 MG tablet, Take 1 mg by mouth at bedtime. , Disp: , Rfl:  .  sertraline (ZOLOFT) 100 MG tablet, Take 100 mg by mouth every evening. , Disp: ,  Rfl:  .  simvastatin (ZOCOR) 40 MG tablet, Take 40 mg by mouth at bedtime. , Disp: , Rfl:  .  SPIRIVA RESPIMAT 2.5 MCG/ACT AERS, INL 2 PFS ITL D, Disp: , Rfl: 11 .  SYMBICORT 160-4.5 MCG/ACT inhaler, Inhale 1 puff into the lungs 2 (two) times daily., Disp: , Rfl:  .  telmisartan (MICARDIS) 80 MG tablet, , Disp: , Rfl:  .  traMADol (ULTRAM) 50 MG tablet, Take 50 mg by mouth every 6 (six) hours as needed for moderate pain. , Disp: , Rfl:  .  venlafaxine XR (EFFEXOR-XR) 37.5 MG 24 hr capsule, Take 37.5 mg by mouth at bedtime. , Disp: , Rfl:   Social History   Tobacco Use  Smoking Status Current Every Day Smoker  . Packs/day: 0.75  . Types: Cigarettes  Smokeless Tobacco Never Used    Allergies  Allergen Reactions  . Codeine Nausea And Vomiting  . Morphine And Related Swelling  . Nsaids Other (See Comments)    Stomach ulcer  History  Stomach ulcer  History   . Penicillins Hives  . Sulfa Antibiotics Swelling  . Tolmetin     Stomach ulcer  History    Objective:   There were no vitals filed for this  visit. There is no height or weight on file to calculate BMI. Constitutional Well developed. Well nourished.  Vascular Dorsalis pedis pulses palpable bilaterally. Posterior tibial pulses palpable bilaterally. Capillary refill normal to all digits.  No cyanosis or clubbing noted. Pedal hair growth normal.  Neurologic Normal speech. Oriented to person, place, and time. Epicritic sensation to light touch grossly present bilaterally.  Dermatologic Nails well groomed and normal in appearance. No open wounds. Xerosis with scaling plantar foot bilat.  Orthopedic: Normal joint ROM without pain or crepitus bilaterally. HAV deformity bilateral, lesser digital contractures bilateral Pain motion about the third interspace bilaterally   Assessment:   1. 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 Neuroma of third interspace of right foot    Plan:  Patient was evaluated and treated and  all questions answered.  Morton's neuroma bilaterally -Continue sclerosing injection therapy injection #4 today  Procedure: Sclerosing Nerve Injection Location: Bilateral third interspace Skin Prep: Alcohol. Injectate: 1.5 cc 4% sclerosing alcohol injection Disposition: Patient tolerated procedure well. Injection site dressed with a band-aid.  Hammertoes Left 4th/5th Toes -Hold off surgical correction until patient is tobacco free Return in about 3 weeks (around 10/22/2018) for Neuroma, both feet.

## 2018-10-29 ENCOUNTER — Telehealth: Payer: Self-pay

## 2018-10-29 ENCOUNTER — Ambulatory Visit: Payer: Medicare HMO | Admitting: Podiatry

## 2019-01-19 ENCOUNTER — Other Ambulatory Visit: Payer: Self-pay | Admitting: Family Medicine

## 2019-01-19 DIAGNOSIS — R600 Localized edema: Secondary | ICD-10-CM

## 2019-01-19 DIAGNOSIS — M79605 Pain in left leg: Secondary | ICD-10-CM

## 2019-01-20 ENCOUNTER — Ambulatory Visit
Admission: RE | Admit: 2019-01-20 | Discharge: 2019-01-20 | Disposition: A | Discharge status: Home / Self Care | Payer: Medicare HMO | Source: Ambulatory Visit | Attending: Family Medicine | Admitting: Family Medicine

## 2019-01-20 DIAGNOSIS — M79605 Pain in left leg: Secondary | ICD-10-CM

## 2019-01-20 DIAGNOSIS — R600 Localized edema: Secondary | ICD-10-CM

## 2019-01-28 ENCOUNTER — Ambulatory Visit (INDEPENDENT_AMBULATORY_CARE_PROVIDER_SITE_OTHER): Payer: Medicare HMO | Admitting: Podiatry

## 2019-01-28 ENCOUNTER — Other Ambulatory Visit: Payer: Self-pay

## 2019-01-28 ENCOUNTER — Encounter: Payer: Self-pay | Admitting: Podiatry

## 2019-01-28 VITALS — Temp 97.3°F

## 2019-01-28 DIAGNOSIS — G5762 Lesion of plantar nerve, left lower limb: Secondary | ICD-10-CM

## 2019-01-28 DIAGNOSIS — G5782 Other specified mononeuropathies of left lower limb: Secondary | ICD-10-CM

## 2019-01-28 NOTE — Progress Notes (Signed)
Subjective:  Patient ID: Stephanie Benton, female    DOB: 17-Dec-1950,  MRN: [id number]  Chief Complaint  Patient presents with  . Neuroma    left foot neuroma pain, pt states that she is having a swelling pain, and is looking to get an injection   68 y.o. female presents with the above complaint.  Hx as above. States injections were helping right no longer hurting left is.  Review of Systems: Negative except as noted in the HPI. Denies N/V/F/Ch.  Past Medical History:  Diagnosis Date  . Hyperlipidemia   . Hypertension     Current Outpatient Medications:  .  albuterol (PROVENTIL) (2.5 MG/3ML) 0.083% nebulizer solution, Take 3 mLs (2.5 mg total) by nebulization every 6 (six) hours as needed for wheezing or shortness of breath., Disp: 75 mL, Rfl: 12 .  amLODipine (NORVASC) 2.5 MG tablet, TAKE 1 TABLET(2.5 MG) BY MOUTH DAILY, Disp: , Rfl:  .  BELSOMRA 10 MG TABS, , Disp: , Rfl:  .  busPIRone (BUSPAR) 7.5 MG tablet, Take by mouth., Disp: , Rfl:  .  DEXILANT 60 MG capsule, Take 60 mg by mouth every evening. , Disp: , Rfl:  .  diclofenac sodium (VOLTAREN) 1 % GEL, Apply topically., Disp: , Rfl:  .  furosemide (LASIX) 20 MG tablet, Take by mouth., Disp: , Rfl:  .  ketoconazole (NIZORAL) 2 % cream, Apply 1 fingertip amount to each foot daily., Disp: 30 g, Rfl: 0 .  latanoprost (XALATAN) 0.005 % ophthalmic solution, Place 1 drop into both eyes at bedtime., Disp: , Rfl:  .  LYRICA 100 MG capsule, Take 100 mg by mouth 2 (two) times daily. , Disp: , Rfl:  .  nicotine (NICODERM CQ - DOSED IN MG/24 HOURS) 21 mg/24hr patch, Place 1 patch (21 mg total) onto the skin daily., Disp: 28 patch, Rfl: 0 .  potassium chloride (K-DUR) 10 MEQ tablet, Take by mouth., Disp: , Rfl:  .  PROAIR HFA 108 (90 BASE) MCG/ACT inhaler, Inhale 2 puffs into the lungs every 6 (six) hours as needed for wheezing or shortness of breath. , Disp: , Rfl:  .  risperiDONE (RISPERDAL) 1 MG tablet, Take 1 mg by mouth at bedtime. , Disp:  , Rfl:  .  sertraline (ZOLOFT) 100 MG tablet, Take 100 mg by mouth every evening. , Disp: , Rfl:  .  simvastatin (ZOCOR) 40 MG tablet, Take 40 mg by mouth at bedtime. , Disp: , Rfl:  .  SPIRIVA RESPIMAT 2.5 MCG/ACT AERS, INL 2 PFS ITL D, Disp: , Rfl: 11 .  SYMBICORT 160-4.5 MCG/ACT inhaler, Inhale 1 puff into the lungs 2 (two) times daily., Disp: , Rfl:  .  telmisartan (MICARDIS) 80 MG tablet, , Disp: , Rfl:  .  traMADol (ULTRAM) 50 MG tablet, Take 50 mg by mouth every 6 (six) hours as needed for moderate pain. , Disp: , Rfl:  .  venlafaxine XR (EFFEXOR-XR) 37.5 MG 24 hr capsule, Take 37.5 mg by mouth at bedtime. , Disp: , Rfl:   Social History   Tobacco Use  Smoking Status Current Every Day Smoker  . Packs/day: 0.75  . Types: Cigarettes  Smokeless Tobacco Never Used    Allergies  Allergen Reactions  . Codeine Nausea And Vomiting  . Morphine And Related Swelling  . Nsaids Other (See Comments)    Stomach ulcer  History  Stomach ulcer  History   . Other Other (See Comments)    Stomach ulcer History  Stomach ulcer  History  Stomach ulcer History   . Penicillins Hives  . Sulfa Antibiotics Swelling  . Tolmetin     Stomach ulcer  History    Objective:   Vitals:   01/28/19 1402  Temp: (!) 97.3 F (36.3 C)   There is no height or weight on file to calculate BMI. Constitutional Well developed. Well nourished.  Vascular Dorsalis pedis pulses palpable bilaterally. Posterior tibial pulses palpable bilaterally. Capillary refill normal to all digits.  No cyanosis or clubbing noted. Pedal hair growth normal.  Neurologic Normal speech. Oriented to person, place, and time. Epicritic sensation to light touch grossly present bilaterally.  Dermatologic Nails well groomed and normal in appearance. No open wounds. Xerosis with scaling plantar foot bilat.  Orthopedic: Normal joint ROM without pain or crepitus bilaterally. HAV deformity bilateral, lesser digital contractures  bilateral Pain palpation about the third interspace    Assessment:   1. 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  Morton's neuroma left -Injection left 3rd IS -Padding dispensed  Procedure: Sclerosing Nerve Injection Location: Left 3rd interspace Skin Prep: Alcohol. Injectate: 1.5 cc 4% sclerosing alcohol injection Disposition: Patient tolerated procedure well. Injection site dressed with a band-aid.  Hammertoes Left 4th/5th Toes -Hold off surgical correction until patient is tobacco free Return in about 3 weeks (around 02/18/2019) for Neuroma left.

## 2019-02-18 ENCOUNTER — Ambulatory Visit: Payer: Medicare HMO | Admitting: Podiatry

## 2019-02-26 ENCOUNTER — Ambulatory Visit (INDEPENDENT_AMBULATORY_CARE_PROVIDER_SITE_OTHER): Payer: Medicare HMO | Admitting: Podiatry

## 2019-02-26 DIAGNOSIS — Z5329 Procedure and treatment not carried out because of patient's decision for other reasons: Secondary | ICD-10-CM

## 2019-02-26 NOTE — Progress Notes (Signed)
Same day cancellation. No show.

## 2019-04-24 IMAGING — CR DG KNEE COMPLETE 4+V*R*
4 series · 4 of 4 positions shown · non-contrast
Comparison: None.

CLINICAL DATA: Right knee pain after fall 2 days ago.

EXAM:
RIGHT KNEE - COMPLETE 4+ VIEW

[t knee ap right]
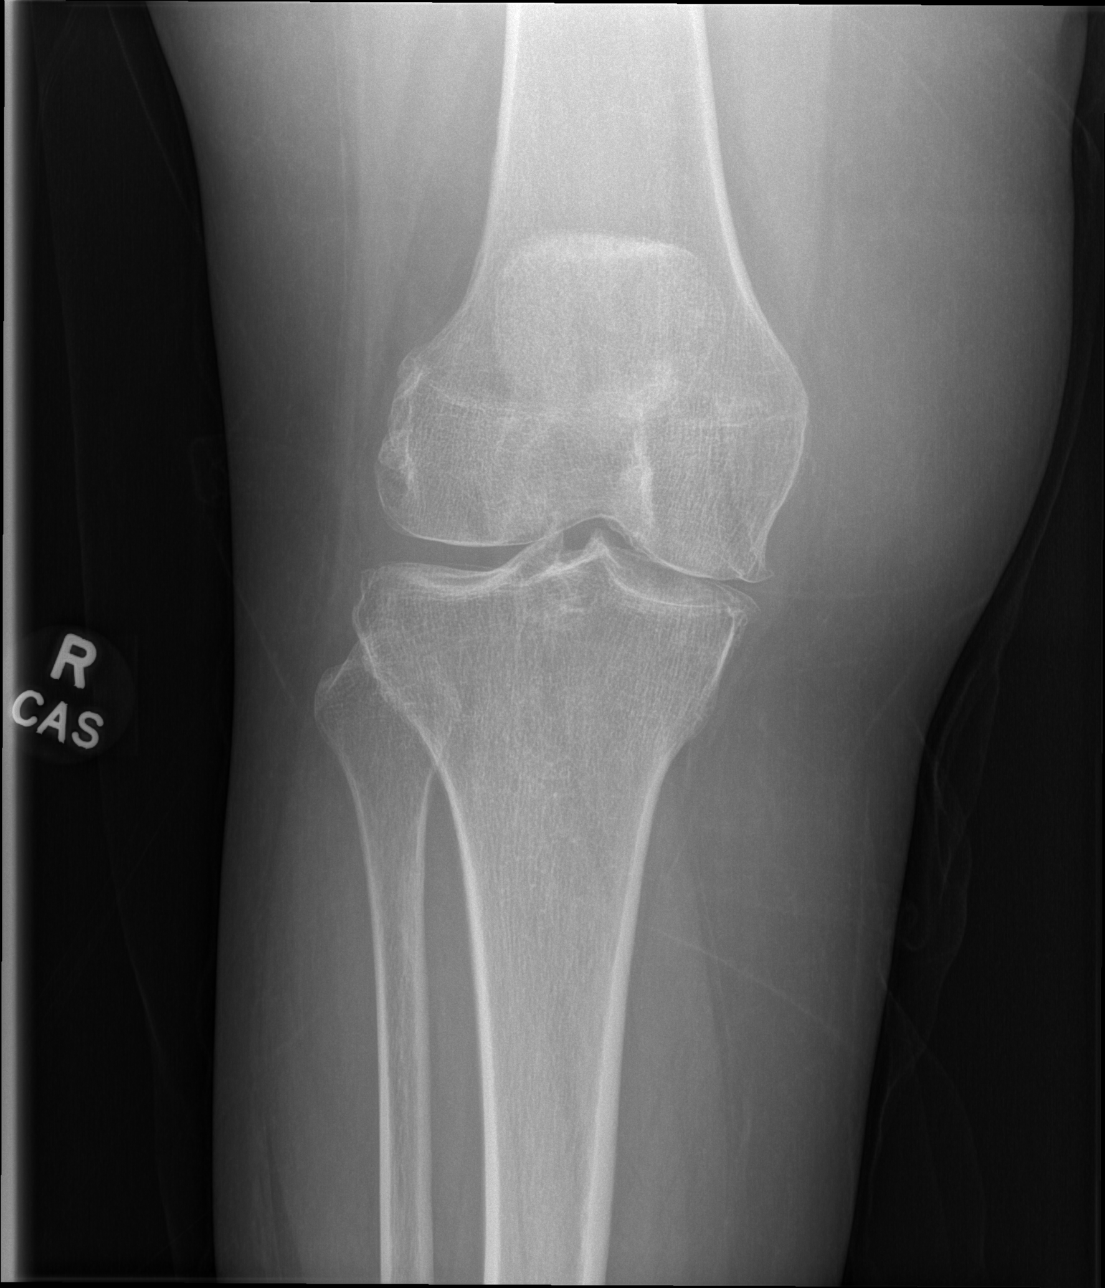

[t knee obl right (1 of 2)]
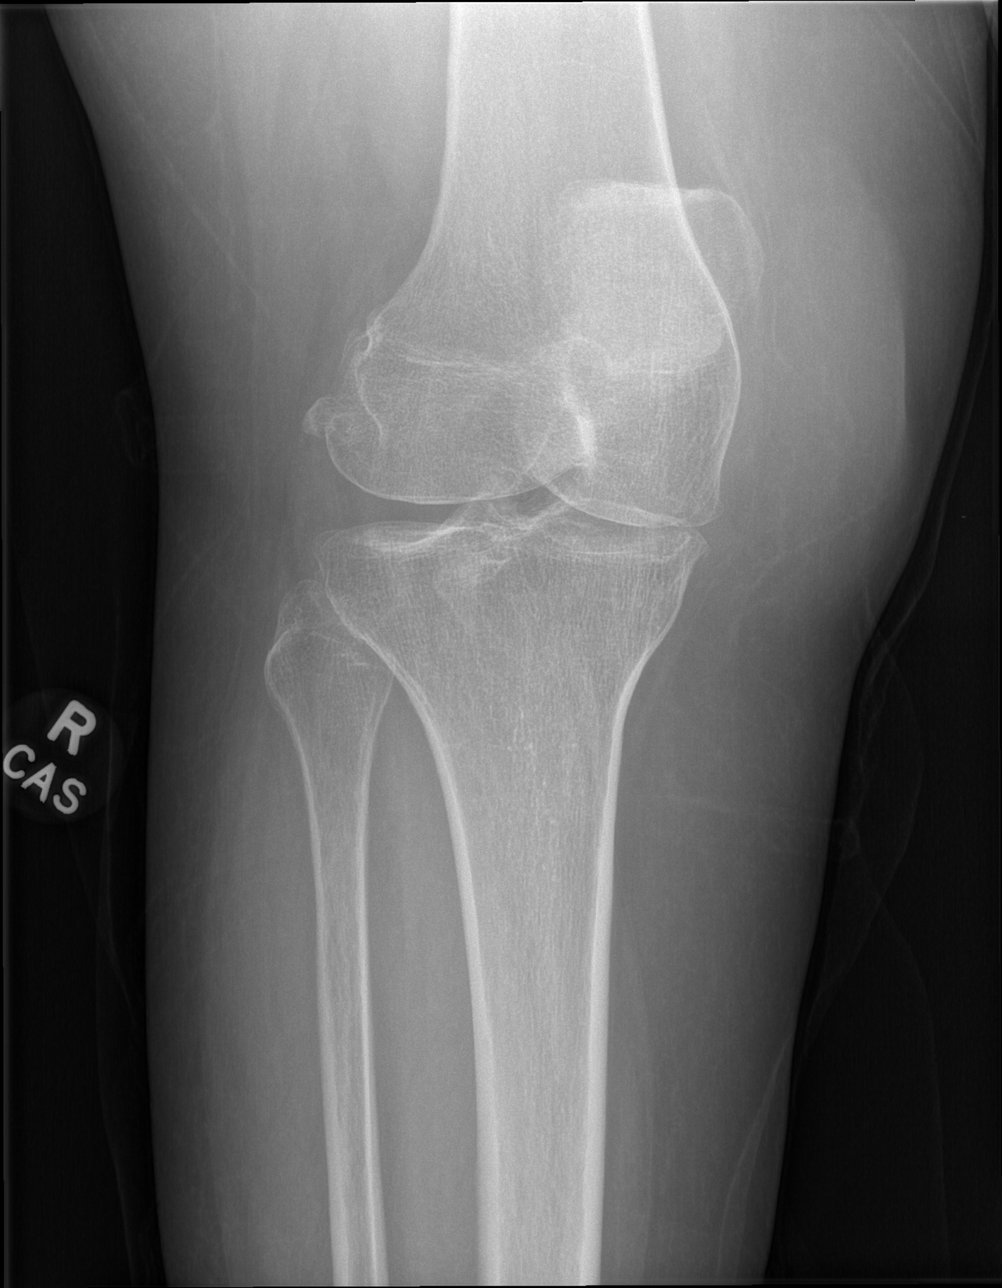

[t knee obl right (2 of 2)]
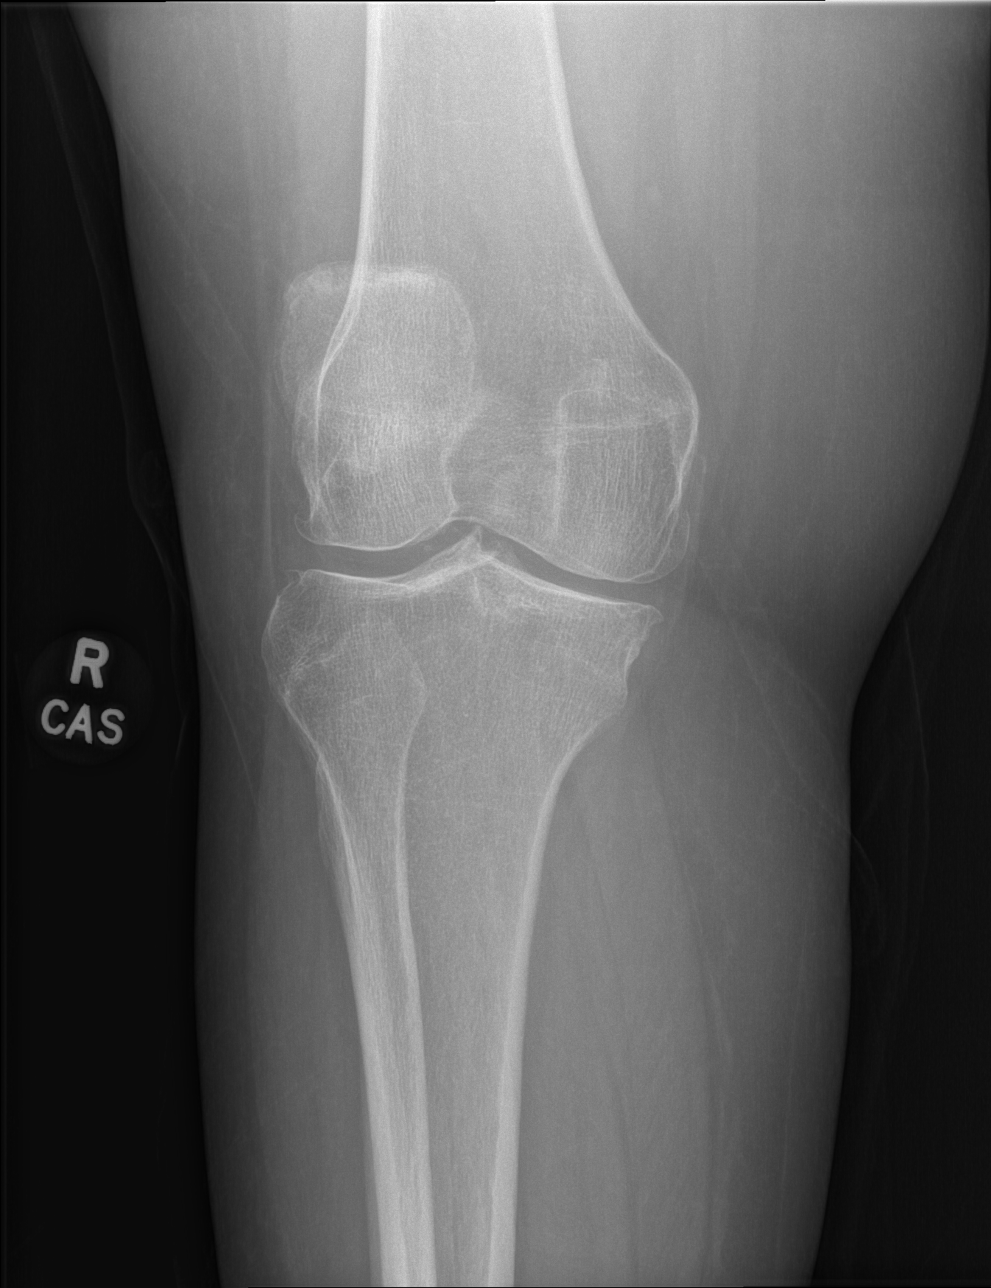

[t knee lat right]
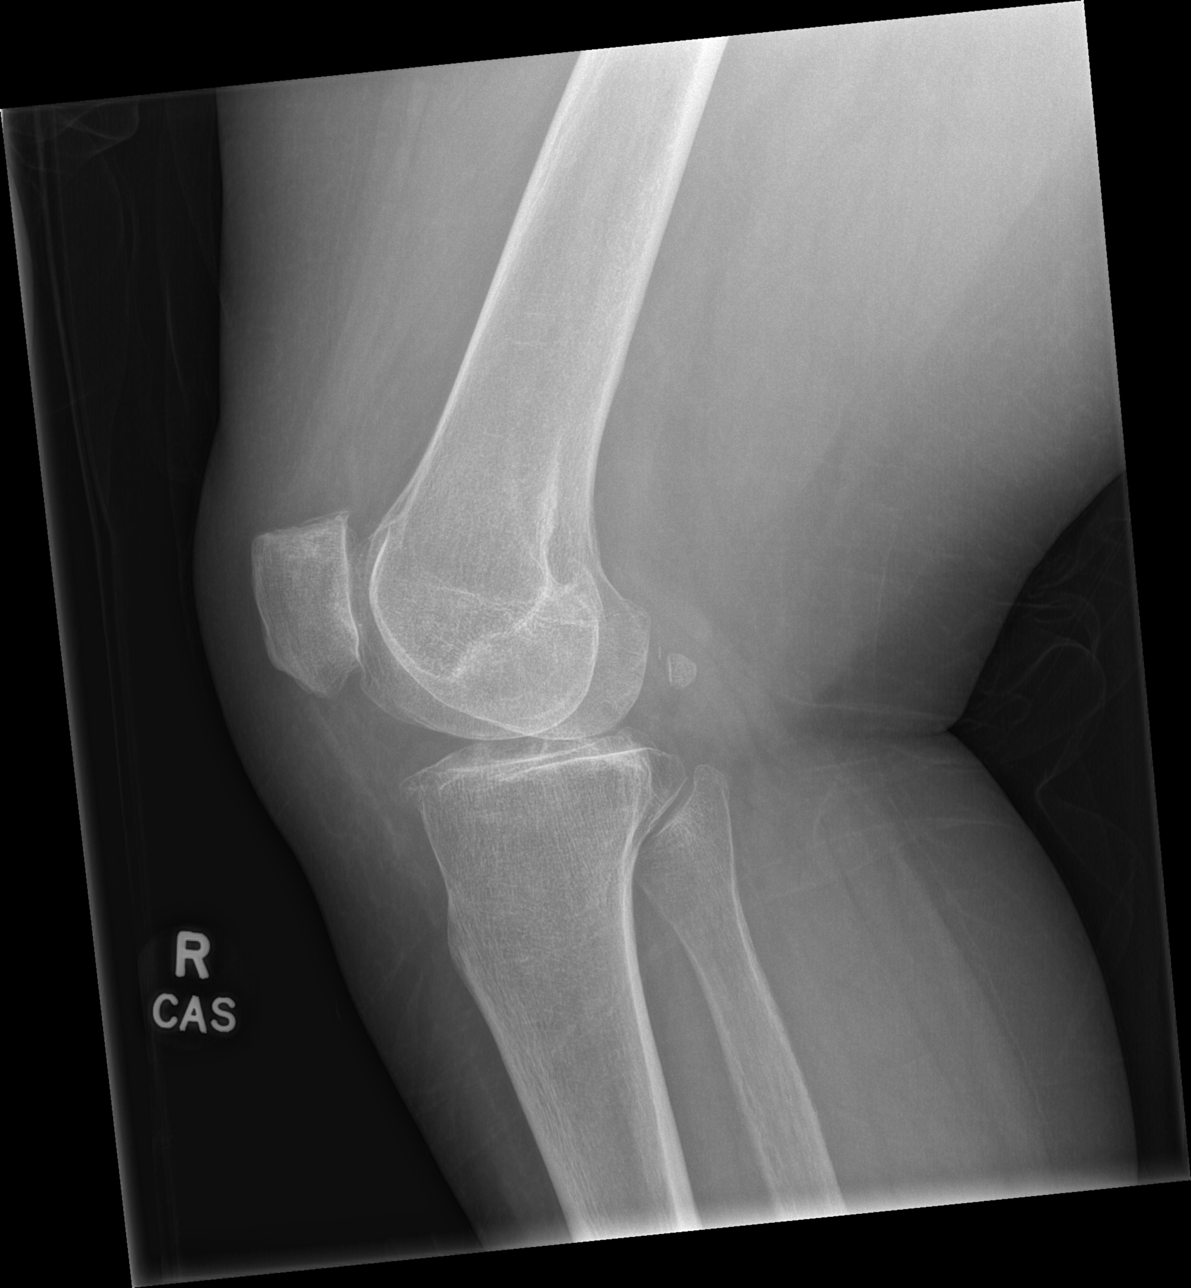

[4 of 4 positions shown; findings below may reference images not displayed]

FINDINGS: Femorotibial and patellofemoral joint space narrowing with spurring
is identified consistent with osteoarthritis more so along the
medial femorotibial compartment. Trace joint effusion. No acute
fracture nor joint dislocation. No significant soft tissue swelling.
IMPRESSION: Osteoarthritis of the right knee without acute osseous abnormality.

## 2019-06-18 ENCOUNTER — Other Ambulatory Visit: Payer: Self-pay

## 2019-06-18 ENCOUNTER — Ambulatory Visit (INDEPENDENT_AMBULATORY_CARE_PROVIDER_SITE_OTHER): Payer: Medicare HMO | Admitting: Podiatry

## 2019-06-18 DIAGNOSIS — G5763 Lesion of plantar nerve, bilateral lower limbs: Secondary | ICD-10-CM

## 2019-06-18 DIAGNOSIS — M2042 Other hammer toe(s) (acquired), left foot: Secondary | ICD-10-CM

## 2019-07-09 ENCOUNTER — Ambulatory Visit (INDEPENDENT_AMBULATORY_CARE_PROVIDER_SITE_OTHER): Payer: Medicare HMO | Admitting: Podiatry

## 2019-07-09 DIAGNOSIS — Z5329 Procedure and treatment not carried out because of patient's decision for other reasons: Secondary | ICD-10-CM

## 2019-07-09 NOTE — Progress Notes (Signed)
Subjective:  Patient ID: Stephanie Benton, female    DOB: 1951-02-15,  MRN: [id number]  Chief Complaint  Patient presents with  . Callouses    Left interdigital 4th corn, painful, 1 mo duration. No known injuries. Pt denies any drianage.   68 y.o. female presents with the above complaint.  Hx as above. Still having pain from the neuromas.  Review of Systems: Negative except as noted in the HPI. Denies N/V/F/Ch.  Past Medical History:  Diagnosis Date  . Hyperlipidemia   . Hypertension     Current Outpatient Medications:  .  albuterol (PROVENTIL) (2.5 MG/3ML) 0.083% nebulizer solution, Take 3 mLs (2.5 mg total) by nebulization every 6 (six) hours as needed for wheezing or shortness of breath., Disp: 75 mL, Rfl: 12 .  amLODipine (NORVASC) 2.5 MG tablet, TAKE 1 TABLET(2.5 MG) BY MOUTH DAILY, Disp: , Rfl:  .  BELSOMRA 10 MG TABS, , Disp: , Rfl:  .  bumetanide (BUMEX) 1 MG tablet, Take by mouth., Disp: , Rfl:  .  busPIRone (BUSPAR) 7.5 MG tablet, Take by mouth., Disp: , Rfl:  .  cyanocobalamin (,VITAMIN B-12,) 1000 MCG/ML injection, Inject into the muscle., Disp: , Rfl:  .  DEXILANT 60 MG capsule, Take 60 mg by mouth every evening. , Disp: , Rfl:  .  diazepam (VALIUM) 5 MG tablet, , Disp: , Rfl:  .  diclofenac sodium (VOLTAREN) 1 % GEL, Apply topically., Disp: , Rfl:  .  furosemide (LASIX) 20 MG tablet, Take by mouth., Disp: , Rfl:  .  ketoconazole (NIZORAL) 2 % cream, Apply 1 fingertip amount to each foot daily., Disp: 30 g, Rfl: 0 .  latanoprost (XALATAN) 0.005 % ophthalmic solution, Place 1 drop into both eyes at bedtime., Disp: , Rfl:  .  LYRICA 100 MG capsule, Take 100 mg by mouth 2 (two) times daily. , Disp: , Rfl:  .  nicotine (NICODERM CQ - DOSED IN MG/24 HOURS) 21 mg/24hr patch, Place 1 patch (21 mg total) onto the skin daily., Disp: 28 patch, Rfl: 0 .  potassium chloride (K-DUR) 10 MEQ tablet, Take by mouth., Disp: , Rfl:  .  PROAIR HFA 108 (90 BASE) MCG/ACT inhaler, Inhale 2  puffs into the lungs every 6 (six) hours as needed for wheezing or shortness of breath. , Disp: , Rfl:  .  risperiDONE (RISPERDAL) 1 MG tablet, Take 1 mg by mouth at bedtime. , Disp: , Rfl:  .  sertraline (ZOLOFT) 100 MG tablet, Take 100 mg by mouth every evening. , Disp: , Rfl:  .  simvastatin (ZOCOR) 40 MG tablet, Take 40 mg by mouth at bedtime. , Disp: , Rfl:  .  SPIRIVA RESPIMAT 2.5 MCG/ACT AERS, INL 2 PFS ITL D, Disp: , Rfl: 11 .  SYMBICORT 160-4.5 MCG/ACT inhaler, Inhale 1 puff into the lungs 2 (two) times daily., Disp: , Rfl:  .  telmisartan (MICARDIS) 80 MG tablet, , Disp: , Rfl:  .  traMADol (ULTRAM) 50 MG tablet, Take 50 mg by mouth every 6 (six) hours as needed for moderate pain. , Disp: , Rfl:  .  venlafaxine XR (EFFEXOR-XR) 37.5 MG 24 hr capsule, Take 37.5 mg by mouth at bedtime. , Disp: , Rfl:   Social History   Tobacco Use  Smoking Status Current Every Day Smoker  . Packs/day: 0.75  . Types: Cigarettes  Smokeless Tobacco Never Used    Allergies  Allergen Reactions  . Codeine Nausea And Vomiting  . Morphine And Related Swelling  .  Nsaids Other (See Comments)    Stomach ulcer  History  Stomach ulcer  History   . Other Other (See Comments)    Stomach ulcer History  Stomach ulcer History  Stomach ulcer History   . Penicillins Hives  . Sulfa Antibiotics Swelling  . Tolmetin     Stomach ulcer  History    Objective:   There were no vitals filed for this visit. There is no height or weight on file to calculate BMI. Constitutional Well developed. Well nourished.  Vascular Dorsalis pedis pulses palpable bilaterally. Posterior tibial pulses palpable bilaterally. Capillary refill normal to all digits.  No cyanosis or clubbing noted. Pedal hair growth normal.  Neurologic Normal speech. Oriented to person, place, and time. Epicritic sensation to light touch grossly present bilaterally.  Dermatologic Nails well groomed and normal in appearance. No open wounds.  Xerosis with scaling plantar foot bilat.  Orthopedic: Normal joint ROM without pain or crepitus bilaterally. HAV deformity bilateral, lesser digital contractures bilateral Pain palpation about the third interspace bilat    Assessment:   1. Morton's neuroma of both feet   2. Hammertoe of left foot    Plan:  Patient was evaluated and treated and all questions answered.  Morton's neuroma left -Injection bilat 3rd IS. -Consider sclerosing injections next visit -Padding dispensed  Procedure: Neuroma Injection Location: Bilateral 3rd interspace Skin Prep: Alcohol. Injectate: 0.5 cc 0.5% marcaine plain, 0.5 cc dexamethasone phosphate. Disposition: Patient tolerated procedure well. Injection site dressed with a band-aid.  Hammertoes Left 4th/5th Toes -Hold off surgical correction until patient is tobacco free  Return in about 3 weeks (around 07/09/2019) for Neuroma, Bilateral; possible sclerosing injection therapy .

## 2019-07-09 NOTE — Progress Notes (Signed)
No show for appt. 

## 2019-10-11 ENCOUNTER — Other Ambulatory Visit: Payer: Self-pay

## 2019-10-11 ENCOUNTER — Emergency Department (HOSPITAL_COMMUNITY): Payer: Medicare Other

## 2019-10-11 ENCOUNTER — Encounter (HOSPITAL_COMMUNITY): Payer: Self-pay

## 2019-10-11 ENCOUNTER — Emergency Department (HOSPITAL_COMMUNITY)
Admission: EM | Admit: 2019-10-11 | Discharge: 2019-10-11 | Disposition: A | Discharge status: Home / Self Care | Payer: Medicare Other | Attending: Emergency Medicine | Admitting: Emergency Medicine

## 2019-10-11 DIAGNOSIS — Z20822 Contact with and (suspected) exposure to covid-19: Secondary | ICD-10-CM | POA: Diagnosis not present

## 2019-10-11 DIAGNOSIS — R2243 Localized swelling, mass and lump, lower limb, bilateral: Secondary | ICD-10-CM | POA: Diagnosis present

## 2019-10-11 DIAGNOSIS — I10 Essential (primary) hypertension: Secondary | ICD-10-CM | POA: Insufficient documentation

## 2019-10-11 DIAGNOSIS — R609 Edema, unspecified: Secondary | ICD-10-CM | POA: Insufficient documentation

## 2019-10-11 DIAGNOSIS — J449 Chronic obstructive pulmonary disease, unspecified: Secondary | ICD-10-CM | POA: Diagnosis not present

## 2019-10-11 DIAGNOSIS — F17210 Nicotine dependence, cigarettes, uncomplicated: Secondary | ICD-10-CM | POA: Diagnosis not present

## 2019-10-11 LAB — CBC
HCT: 31.9 % — ABNORMAL LOW (ref 36.0–46.0)
Hemoglobin: 10.1 g/dL — ABNORMAL LOW (ref 12.0–15.0)
MCH: 27.9 pg (ref 26.0–34.0)
MCHC: 31.7 g/dL (ref 30.0–36.0)
MCV: 88.1 fL (ref 80.0–100.0)
Platelets: 196 10*3/uL (ref 150–400)
RBC: 3.62 MIL/uL — ABNORMAL LOW (ref 3.87–5.11)
RDW: 14.5 % (ref 11.5–15.5)
WBC: 5.6 10*3/uL (ref 4.0–10.5)
nRBC: 0 % (ref 0.0–0.2)

## 2019-10-11 LAB — BASIC METABOLIC PANEL
Anion gap: 8 (ref 5–15)
BUN: 6 mg/dL — ABNORMAL LOW (ref 8–23)
CO2: 24 mmol/L (ref 22–32)
Calcium: 8.6 mg/dL — ABNORMAL LOW (ref 8.9–10.3)
Chloride: 109 mmol/L (ref 98–111)
Creatinine, Ser: 1.05 mg/dL — ABNORMAL HIGH (ref 0.44–1.00)
GFR calc Af Amer: >60 mL/min (ref >60)
GFR calc non Af Amer: 54 mL/min — ABNORMAL LOW (ref >60)
Glucose, Bld: 84 mg/dL (ref 70–99)
Potassium: 3.5 mmol/L (ref 3.5–5.1)
Sodium: 141 mmol/L (ref 135–145)

## 2019-10-11 MED ORDER — FUROSEMIDE 20 MG PO TABS
10.0000 mg | ORAL_TABLET | Freq: Once | ORAL | Status: AC
  Administered 2019-10-11: 10 mg via ORAL
  Filled 2019-10-11: qty 1

## 2019-10-11 MED ORDER — FUROSEMIDE 20 MG PO TABS: 10.0000 mg | ORAL_TABLET | Freq: Every day | ORAL | 0 refills | Status: DC

## 2019-10-11 NOTE — ED Triage Notes (Signed)
Pt sent from [facility] for further evaluation of bilateral leg swelling and DOE for the past 2 weeks. Pt was on lasix in the past but was taken off and not sure why. Pt a.o, resp e.u at this time.

## 2019-10-11 NOTE — ED Notes (Signed)
Pt ambulated around room with pulse ox. Oxygen saturation remained at 100%; pt denies sob on ambulation.

## 2019-10-11 NOTE — Discharge Instructions (Addendum)
You were seen in the emergency department today with swelling in your legs.  I am restarting your Lasix at a low dose for the next 4 days.  You can pick this prescription up at the pharmacy tomorrow.  If you develop worsening shortness of breath, difficulty laying flat, chest pain, or inability to urinate you need to return to the emergency department for reevaluation.  Please call your primary care doctor first thing tomorrow to schedule the next available follow-up appointment.   I am sending a test for COVID-19.  You can follow the results in the MyChart app.  There is information in the discharge paperwork about how to set that up on your phone.  Please stay isolated until this test result comes back negative and you are feeling better for at least 3 days.

## 2019-10-11 NOTE — ED Provider Notes (Signed)
Emergency Department Provider Note   I have reviewed the triage vital signs and the nursing notes.   HISTORY  Chief Complaint Leg Swelling and Shortness of Breath   HPI Stephanie Benton is a 69 y.o. female with PMH of HLD, HTN, and prior history of LE edema requiring lasix presents to the emergency department for evaluation of bilateral lower extremity swelling over the past 2 weeks.  Patient has taken Lasix in the past but states she was taken off of it for unknown reason.  She denies significant shortness of breath.   This is mentioned in the triage note and I asked her about this and she tells me that she does have some mild asthma but it is not worse than normal.  She denies chest pain.  She initially went to urgent care and then was referred to the emergency department for further evaluation.  No radiation of symptoms or other modifying factors. No known history of CHF per patient. No fever or URI symptoms. No COVID diagnosis previously or recent testing.    Past Medical History:  Diagnosis Date  . Hyperlipidemia   . Hypertension     Patient Active Problem List   Diagnosis Date Noted  . Primary osteoarthritis of left knee 05/12/2018  . Insomnia due to mental condition 05/08/2018  . Chronic bilateral low back pain 03/25/2018  . Tobacco abuse counseling 02/18/2018  . Hyperkalemia 02/18/2018  . Chest pain 02/17/2018  . COPD with acute exacerbation ([place]) 02/17/2018  . Hypertension 02/17/2018  . Depression with anxiety 02/17/2018  . AKI (acute kidney injury) ([place])   . Normochromic normocytic anemia   . Bipolar disorder, in partial remission, most recent episode mixed ([place]) 07/04/2017  . Osteopenia of multiple sites 07/04/2017  . Bipolar disorder, in full remission, most recent episode mixed ([place]) 07/04/2017  . Cigarette smoker 06/12/2017  . Bilateral hip pain 05/01/2017  . B12 deficiency 03/04/2017  . Spondylosis of lumbar region without myelopathy or radiculopathy  08/08/2016  . Risk for falls 05/06/2016  . Hypercholesterolemia 02/21/2016    Past Surgical History:  Procedure Laterality Date  . ABDOMINAL HYSTERECTOMY    . APPENDECTOMY    . HERNIA REPAIR    . TONSILLECTOMY    . TUBAL LIGATION      Allergies Fish-derived products, Morphine and related, Belsomra [suvorexant], Codeine, Fentanyl, Ibuprofen, Nsaids, Other, Penicillins, Sulfa antibiotics, and Tolmetin  Family History  Problem Relation Age of Onset  . Heart failure Mother   . Cancer Father   . Cancer Sister     Social History Social History   Tobacco Use  . Smoking status: Current Every Day Smoker    Packs/day: 0.75    Types: Cigarettes  . Smokeless tobacco: Never Used  Substance Use Topics  . Alcohol use: No  . Drug use: No    Review of Systems  Constitutional: No fever/chills Eyes: No visual changes. ENT: No sore throat. Cardiovascular: Denies chest pain. Bilateral LE edema.  Respiratory: Baseline shortness of breath mainly with exertion. Not worse than normal.  Gastrointestinal: No abdominal pain.  No nausea, no vomiting.  No diarrhea.  No constipation. Genitourinary: Negative for dysuria. Musculoskeletal: Negative for back pain. Skin: Negative for rash. Neurological: Negative for headaches, focal weakness or numbness.  10-point ROS otherwise negative.  ____________________________________________   PHYSICAL EXAM:  VITAL SIGNS: ED Triage Vitals  Enc Vitals Group     BP 10/11/19 1607 (!) 144/59     Pulse Rate 10/11/19 1603 71  Resp 10/11/19 1603 16     Temp 10/11/19 1603 98.2 F (36.8 C)     Temp Source 10/11/19 1603 Oral     SpO2 10/11/19 1603 97 %     Weight 10/11/19 1605 227 lb (103 kg)     Height 10/11/19 1605 5\' 5"  (1.651 m)   Constitutional: Alert and oriented. Well appearing and in no acute distress. Eyes: Conjunctivae are normal.  Head: Atraumatic. Nose: No congestion/rhinnorhea. Mouth/Throat: Mucous membranes are moist.  Neck: No  stridor.  Cardiovascular: Normal rate, regular rhythm. Good peripheral circulation. Grossly normal heart sounds.   Respiratory: Normal respiratory effort.  No retractions. Lungs CTAB. Gastrointestinal: Soft and nontender. No distention.  Musculoskeletal: No lower extremity tenderness with 2+ pitting edema. No gross deformities of extremities. Neurologic:  Normal speech and language. No gross focal neurologic deficits are appreciated.  Skin:  Skin is warm, dry and intact. No rash noted.   ____________________________________________   LABS (all labs ordered are listed, but only abnormal results are displayed)  Labs Reviewed  BASIC METABOLIC PANEL - Abnormal; Notable for the following components:      Result Value   BUN 6 (*)    Creatinine, Ser 1.05 (*)    Calcium 8.6 (*)    GFR calc non Af Amer 54 (*)    All other components within normal limits  CBC - Abnormal; Notable for the following components:   RBC 3.62 (*)    Hemoglobin 10.1 (*)    HCT 31.9 (*)    All other components within normal limits  SARS CORONAVIRUS 2 (TAT 6-24 HRS)   ____________________________________________  EKG   EKG Interpretation  Date/Time:  Monday October 11 2019 16:02:18 EST Ventricular Rate:  72 PR Interval:  158 QRS Duration: 78 QT Interval:  376 QTC Calculation: 411 R Axis:   29 Text Interpretation: Normal sinus rhythm Normal ECG No STEMI Confirmed by 12-02-1996 [phone]) on 10/11/2019 9:01:17 PM       ____________________________________________  RADIOLOGY  DG Chest 2 View  Result Date: 10/11/2019 CLINICAL DATA:  Dyspnea. EXAM: CHEST - 2 VIEW COMPARISON:  February 16, 2018. FINDINGS: The cardiac silhouette is normal. A moderately sized hiatal hernia is redemonstrated. Diffuse interstitial edema is present throughout both lungs. There is no pleural effusion or pneumonia. Aortic knob and left carotid calcified atherosclerosis and mild skeletal degenerative changes are present. IMPRESSION: 1.  Diffuse interstitial edema, concerning for possible viral infection. 2. Moderate hiatal hernia. 3. 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 4. No pneumonia. Electronically Signed   By: February 18, 2018   On: 10/11/2019 16:34    ____________________________________________   PROCEDURES  Procedure(s) performed:   Procedures  None  ____________________________________________   INITIAL IMPRESSION / ASSESSMENT AND PLAN / ED COURSE  Pertinent labs & imaging results that were available during my care of the patient were reviewed by me and considered in my medical decision making (see chart for details).   Patient presents to the emergency department valuation of lower extremity edema.  She describes some dyspnea on exertion but states it is not worse than her baseline.  She is not having increased work of breathing.  No jugular venous distention.  No rales or rhonchi on exam.  Chest x-ray shows some interstitial type edema.  Doubt viral type infection but will send Covid screen.   Patient ambulatory in the emergency department without hypoxemia.  Plan for Covid testing but very low suspicion for this clinically.  Patient  to follow the results in MyChart.  Starting with low-dose Lasix as the patient has had AKI in the past.  No evidence of that here.  Advise very close PCP follow-up.  Patient is to call her PCP in the morning to discuss her ED visit and medication change.  Discussed ED return precautions in detail.  ____________________________________________  FINAL CLINICAL IMPRESSION(S) / ED DIAGNOSES  Final diagnoses:  Peripheral edema     MEDICATIONS GIVEN DURING THIS VISIT:  Medications  furosemide (LASIX) tablet 10 mg (has no administration in time range)     NEW OUTPATIENT MEDICATIONS STARTED DURING THIS VISIT:  New Prescriptions   FUROSEMIDE (LASIX) 20 MG TABLET    Take 0.5 tablets (10 mg total) by mouth daily for 4 days.    Note:  This document  was prepared using Dragon voice recognition software and may include unintentional dictation errors.  Alona Bene, MD, [facility]    Gennette Shadix, Arlyss Repress, MD 10/11/19 2129

## 2019-10-12 LAB — SARS CORONAVIRUS 2 (TAT 6-24 HRS): SARS Coronavirus 2: NEGATIVE

## 2021-03-26 IMAGING — CR DG CHEST 2V
2 series · 2 of 2 positions shown · non-contrast
Comparison: February 16, 2018.

CLINICAL DATA: Dyspnea.

EXAM:
CHEST - 2 VIEW

[chest pa]
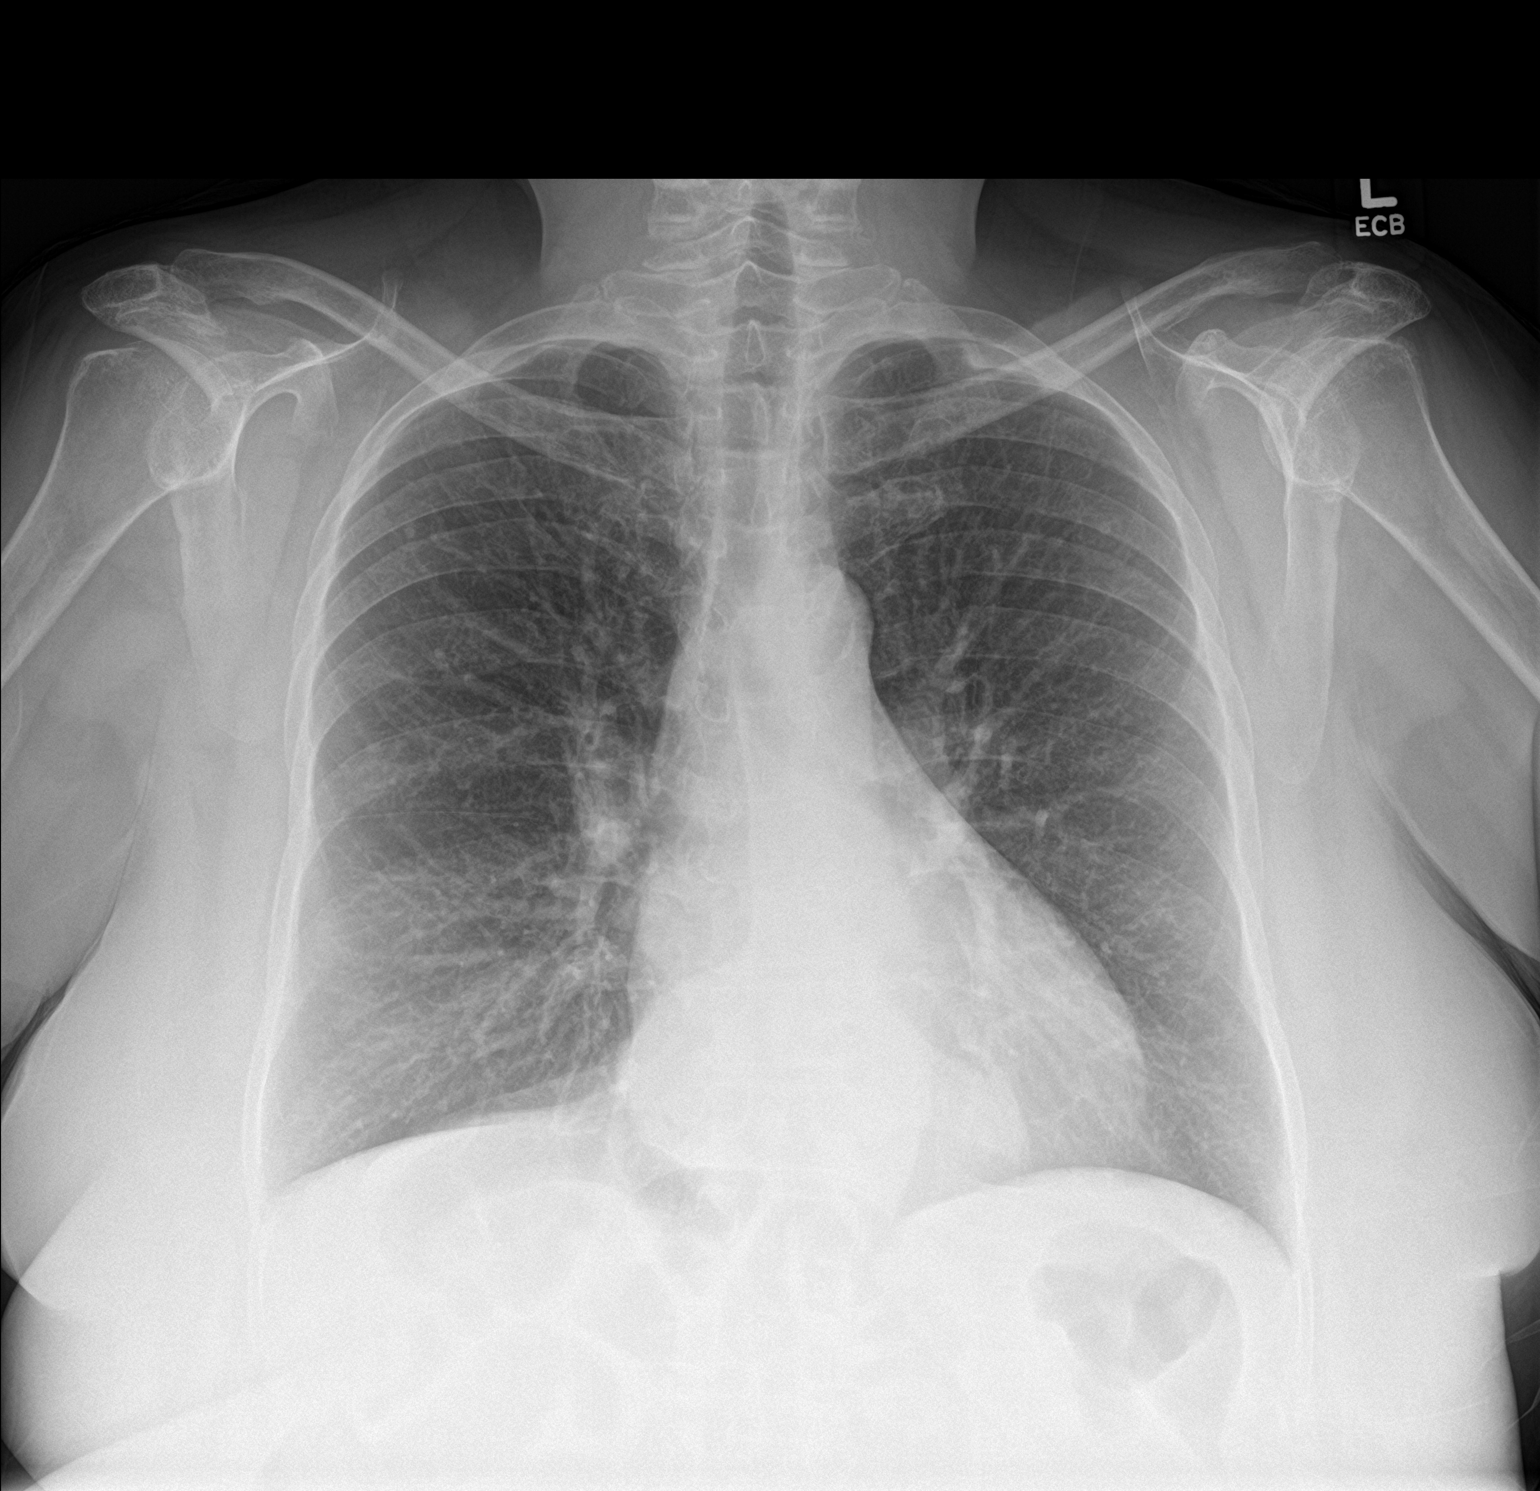

[chest lat]
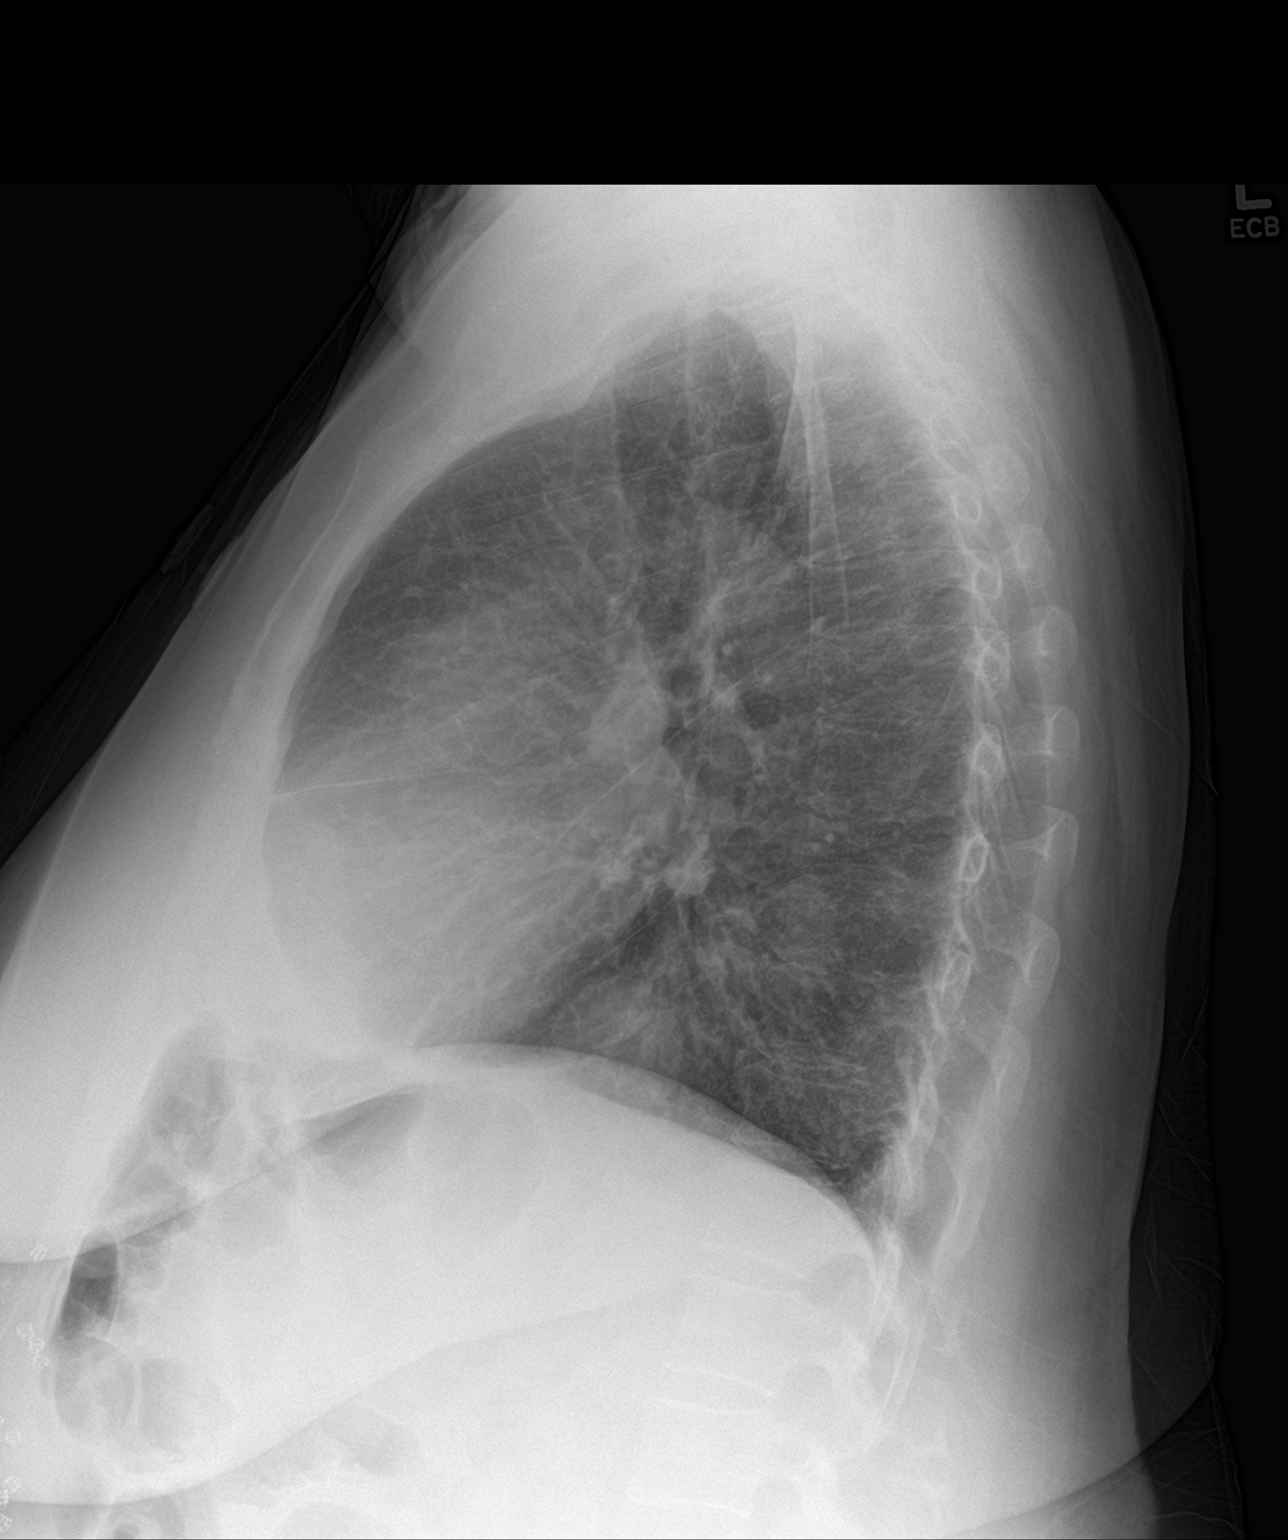

[2 of 2 positions shown; findings below may reference images not displayed]

FINDINGS: The cardiac silhouette is normal. A moderately sized hiatal hernia
is redemonstrated. Diffuse interstitial edema is present throughout
both lungs. There is no pleural effusion or pneumonia. Aortic knob
and left carotid calcified atherosclerosis and mild skeletal
degenerative changes are present.
IMPRESSION: 1. Diffuse interstitial edema, concerning for possible viral
infection.
2. Moderate hiatal hernia.
3. Atherosclerosis of the aortic knob and left carotid arterial
system.
4. No pneumonia.

## 2021-04-25 DIAGNOSIS — M4317 Spondylolisthesis, lumbosacral region: Secondary | ICD-10-CM | POA: Insufficient documentation

## 2021-08-21 DIAGNOSIS — G479 Sleep disorder, unspecified: Secondary | ICD-10-CM | POA: Insufficient documentation

## 2022-01-22 ENCOUNTER — Ambulatory Visit (INDEPENDENT_AMBULATORY_CARE_PROVIDER_SITE_OTHER): Payer: Self-pay | Admitting: Podiatry

## 2022-01-22 DIAGNOSIS — Z91199 Patient's noncompliance with other medical treatment and regimen due to unspecified reason: Secondary | ICD-10-CM

## 2022-04-29 ENCOUNTER — Ambulatory Visit: Payer: Medicare Other | Admitting: Podiatry

## 2022-05-06 ENCOUNTER — Emergency Department (HOSPITAL_COMMUNITY)
Admission: EM | Admit: 2022-05-06 | Discharge: 2022-05-06 | Discharge status: Against Medical Advice | Payer: Medicare Other | Attending: Emergency Medicine | Admitting: Emergency Medicine

## 2022-05-06 ENCOUNTER — Emergency Department (HOSPITAL_COMMUNITY): Payer: Medicare Other

## 2022-05-06 ENCOUNTER — Encounter (HOSPITAL_COMMUNITY): Payer: Self-pay | Admitting: Emergency Medicine

## 2022-05-06 ENCOUNTER — Other Ambulatory Visit: Payer: Self-pay

## 2022-05-06 DIAGNOSIS — R109 Unspecified abdominal pain: Secondary | ICD-10-CM | POA: Insufficient documentation

## 2022-05-06 DIAGNOSIS — R079 Chest pain, unspecified: Secondary | ICD-10-CM | POA: Insufficient documentation

## 2022-05-06 DIAGNOSIS — R112 Nausea with vomiting, unspecified: Secondary | ICD-10-CM | POA: Insufficient documentation

## 2022-05-06 DIAGNOSIS — R0602 Shortness of breath: Secondary | ICD-10-CM | POA: Diagnosis not present

## 2022-05-06 DIAGNOSIS — Z5321 Procedure and treatment not carried out due to patient leaving prior to being seen by health care provider: Secondary | ICD-10-CM | POA: Insufficient documentation

## 2022-05-06 LAB — CBC WITH DIFFERENTIAL/PLATELET
Abs Immature Granulocytes: 0.02 10*3/uL (ref 0.00–0.07)
Basophils Absolute: 0 10*3/uL (ref 0.0–0.1)
Basophils Relative: 1 %
Eosinophils Absolute: 0 10*3/uL (ref 0.0–0.5)
Eosinophils Relative: 0 %
HCT: 44.6 % (ref 36.0–46.0)
Hemoglobin: 14.7 g/dL (ref 12.0–15.0)
Immature Granulocytes: 0 %
Lymphocytes Relative: 15 %
Lymphs Abs: 0.9 10*3/uL (ref 0.7–4.0)
MCH: 26.3 pg (ref 26.0–34.0)
MCHC: 33 g/dL (ref 30.0–36.0)
MCV: 79.8 fL — ABNORMAL LOW (ref 80.0–100.0)
Monocytes Absolute: 0.6 10*3/uL (ref 0.1–1.0)
Monocytes Relative: 10 %
Neutro Abs: 4.6 10*3/uL (ref 1.7–7.7)
Neutrophils Relative %: 74 %
Platelets: 197 10*3/uL (ref 150–400)
RBC: 5.59 MIL/uL — ABNORMAL HIGH (ref 3.87–5.11)
RDW: 15.7 % — ABNORMAL HIGH (ref 11.5–15.5)
WBC: 6.2 10*3/uL (ref 4.0–10.5)
nRBC: 0 % (ref 0.0–0.2)

## 2022-05-06 LAB — COMPREHENSIVE METABOLIC PANEL
ALT: 16 U/L (ref 0–44)
AST: 25 U/L (ref 15–41)
Albumin: 4.1 g/dL (ref 3.5–5.0)
Alkaline Phosphatase: 95 U/L (ref 38–126)
Anion gap: 24 — ABNORMAL HIGH (ref 5–15)
BUN: 9 mg/dL (ref 8–23)
CO2: 18 mmol/L — ABNORMAL LOW (ref 22–32)
Calcium: 10.5 mg/dL — ABNORMAL HIGH (ref 8.9–10.3)
Chloride: 94 mmol/L — ABNORMAL LOW (ref 98–111)
Creatinine, Ser: 1.72 mg/dL — ABNORMAL HIGH (ref 0.44–1.00)
GFR, Estimated: 31 mL/min — ABNORMAL LOW (ref >60)
Glucose, Bld: 57 mg/dL — ABNORMAL LOW (ref 70–99)
Potassium: 2.8 mmol/L — ABNORMAL LOW (ref 3.5–5.1)
Sodium: 136 mmol/L (ref 135–145)
Total Bilirubin: 1.8 mg/dL — ABNORMAL HIGH (ref 0.3–1.2)
Total Protein: 7.7 g/dL (ref 6.5–8.1)

## 2022-05-06 LAB — LIPASE, BLOOD: Lipase: 195 U/L — ABNORMAL HIGH (ref 11–51)

## 2022-05-06 LAB — TROPONIN I (HIGH SENSITIVITY): Troponin I (High Sensitivity): 23 ng/L — ABNORMAL HIGH (ref <18)

## 2022-05-06 NOTE — ED Notes (Signed)
Pt stated she had called her a cab and was leaving.

## 2022-05-06 NOTE — ED Triage Notes (Signed)
Patient reports mid sternal/epigastric pain with emesis for several days , she added pain at bilateral thighs , no injury . Denies SOB or fever .

## 2022-05-06 NOTE — ED Provider Triage Note (Signed)
  Emergency Medicine Provider Triage Evaluation Note  MRN:  [id number]  Arrival date & time: 05/06/22    Medically screening exam initiated at 4:30 AM.   CC:   Chest Pain and Abdominal Pain   HPI:  Stephanie Benton is a 71 y.o. year-old female presents to the ED with chief complaint of chest pain and abdominal pain for the past week.  Reports associated shortness of breath, nausea, and vomiting.  She denies taking any medications for the symptoms.  She also reports bilateral lower extremity pain which is not new.  She denies fever, chills, or cough.  She states that she has remote history of MI.  History provided by patient. ROS:  -As included in HPI PE:   Vitals:   05/06/22 0425  BP: (!) 152/89  Pulse: (!) 107  Resp: 18  Temp: 98.4 F (36.9 C)  SpO2: 98%    Non-toxic appearing No respiratory distress Tachycardic MDM:  Patient here with CP and abdominal pain of uncertain etiology. I've ordered labs and imaging in triage to expedite lab/diagnostic workup.  Patient was informed that the remainder of the evaluation will be completed by another provider, this initial triage assessment does not replace that evaluation, and the importance of remaining in the ED until their evaluation is complete.    Montine Circle, PA-C 05/06/22 [phone]

## 2022-05-08 ENCOUNTER — Emergency Department (HOSPITAL_COMMUNITY): Payer: Medicare Other

## 2022-05-08 ENCOUNTER — Other Ambulatory Visit: Payer: Self-pay

## 2022-05-08 ENCOUNTER — Emergency Department (HOSPITAL_COMMUNITY)
Admission: EM | Admit: 2022-05-08 | Discharge: 2022-05-09 | Discharge status: Against Medical Advice | Payer: Medicare Other | Attending: Physician Assistant | Admitting: Physician Assistant

## 2022-05-08 ENCOUNTER — Encounter (HOSPITAL_COMMUNITY): Payer: Self-pay | Admitting: Emergency Medicine

## 2022-05-08 DIAGNOSIS — Z5321 Procedure and treatment not carried out due to patient leaving prior to being seen by health care provider: Secondary | ICD-10-CM | POA: Insufficient documentation

## 2022-05-08 DIAGNOSIS — I509 Heart failure, unspecified: Secondary | ICD-10-CM | POA: Insufficient documentation

## 2022-05-08 DIAGNOSIS — R110 Nausea: Secondary | ICD-10-CM | POA: Insufficient documentation

## 2022-05-08 DIAGNOSIS — R079 Chest pain, unspecified: Secondary | ICD-10-CM | POA: Diagnosis not present

## 2022-05-08 DIAGNOSIS — R1013 Epigastric pain: Secondary | ICD-10-CM | POA: Insufficient documentation

## 2022-05-08 LAB — CBC WITH DIFFERENTIAL/PLATELET
Abs Immature Granulocytes: 0.03 10*3/uL (ref 0.00–0.07)
Basophils Absolute: 0.1 10*3/uL (ref 0.0–0.1)
Basophils Relative: 1 %
Eosinophils Absolute: 0 10*3/uL (ref 0.0–0.5)
Eosinophils Relative: 1 %
HCT: 43.3 % (ref 36.0–46.0)
Hemoglobin: 14.6 g/dL (ref 12.0–15.0)
Immature Granulocytes: 1 %
Lymphocytes Relative: 16 %
Lymphs Abs: 1 10*3/uL (ref 0.7–4.0)
MCH: 26.6 pg (ref 26.0–34.0)
MCHC: 33.7 g/dL (ref 30.0–36.0)
MCV: 79 fL — ABNORMAL LOW (ref 80.0–100.0)
Monocytes Absolute: 0.7 10*3/uL (ref 0.1–1.0)
Monocytes Relative: 12 %
Neutro Abs: 4.5 10*3/uL (ref 1.7–7.7)
Neutrophils Relative %: 69 %
Platelets: 199 10*3/uL (ref 150–400)
RBC: 5.48 MIL/uL — ABNORMAL HIGH (ref 3.87–5.11)
RDW: 15.2 % (ref 11.5–15.5)
WBC: 6.3 10*3/uL (ref 4.0–10.5)
nRBC: 0 % (ref 0.0–0.2)

## 2022-05-08 LAB — COMPREHENSIVE METABOLIC PANEL
ALT: 14 U/L (ref 0–44)
AST: 29 U/L (ref 15–41)
Albumin: 4 g/dL (ref 3.5–5.0)
Alkaline Phosphatase: 99 U/L (ref 38–126)
Anion gap: 25 — ABNORMAL HIGH (ref 5–15)
BUN: 15 mg/dL (ref 8–23)
CO2: 20 mmol/L — ABNORMAL LOW (ref 22–32)
Calcium: 10.5 mg/dL — ABNORMAL HIGH (ref 8.9–10.3)
Chloride: 94 mmol/L — ABNORMAL LOW (ref 98–111)
Creatinine, Ser: 1.61 mg/dL — ABNORMAL HIGH (ref 0.44–1.00)
GFR, Estimated: 34 mL/min — ABNORMAL LOW (ref >60)
Glucose, Bld: 56 mg/dL — ABNORMAL LOW (ref 70–99)
Potassium: 3.3 mmol/L — ABNORMAL LOW (ref 3.5–5.1)
Sodium: 139 mmol/L (ref 135–145)
Total Bilirubin: 1.9 mg/dL — ABNORMAL HIGH (ref 0.3–1.2)
Total Protein: 7.5 g/dL (ref 6.5–8.1)

## 2022-05-08 LAB — LIPASE, BLOOD: Lipase: 320 U/L — ABNORMAL HIGH (ref 11–51)

## 2022-05-08 LAB — TROPONIN I (HIGH SENSITIVITY): Troponin I (High Sensitivity): 18 ng/L — ABNORMAL HIGH (ref <18)

## 2022-05-08 LAB — BRAIN NATRIURETIC PEPTIDE: B Natriuretic Peptide: 19.7 pg/mL (ref 0.0–100.0)

## 2022-05-08 NOTE — ED Provider Triage Note (Signed)
Emergency Medicine Provider Triage Evaluation Note  Stephanie Benton , a 71 y.o. female  was evaluated in triage.  Pt complains of chest pain.  Located epigastric region and left chest.  Some nausea.  No vomiting.  States she has been having some swelling to her bilateral lower extremities.  History of heart failure however denies any Lasix use.  Sleeps with 3 pillows.  Review of Systems  Positive: CP, SOB, LE swelling Negative:   Physical Exam  There were no vitals taken for this visit. Gen:   Awake, no distress   Resp:  Normal effort  MSK:   Moves extremities without difficulty  Other:    Medical Decision Making  Medically screening exam initiated at 8:12 PM.  Appropriate orders placed.  CALIROSE MCCANCE was informed that the remainder of the evaluation will be completed by another provider, this initial triage assessment does not replace that evaluation, and the importance of remaining in the ED until their evaluation is complete.   CP, SOB   Skya Mccullum A, PA-C 05/08/22 2013

## 2022-05-08 NOTE — ED Notes (Signed)
Pt clutching chest and complaining of chest pain. This Probation officer took vitals

## 2022-05-08 NOTE — ED Triage Notes (Signed)
Patient reports persistent left chest pain for several days with mild SOB , no emesis or diaphoresis .

## 2022-05-09 ENCOUNTER — Emergency Department (HOSPITAL_BASED_OUTPATIENT_CLINIC_OR_DEPARTMENT_OTHER)
Admission: EM | Admit: 2022-05-09 | Discharge: 2022-05-09 | Disposition: A | Discharge status: Home / Self Care | Payer: Medicare Other | Source: Home / Self Care | Attending: Emergency Medicine | Admitting: Emergency Medicine

## 2022-05-09 ENCOUNTER — Emergency Department (HOSPITAL_BASED_OUTPATIENT_CLINIC_OR_DEPARTMENT_OTHER): Payer: Medicare Other

## 2022-05-09 ENCOUNTER — Encounter (HOSPITAL_BASED_OUTPATIENT_CLINIC_OR_DEPARTMENT_OTHER): Payer: Self-pay

## 2022-05-09 ENCOUNTER — Other Ambulatory Visit: Payer: Self-pay

## 2022-05-09 DIAGNOSIS — R079 Chest pain, unspecified: Secondary | ICD-10-CM | POA: Insufficient documentation

## 2022-05-09 LAB — TROPONIN I (HIGH SENSITIVITY)
Troponin I (High Sensitivity): 26 ng/L — ABNORMAL HIGH (ref <18)
Troponin I (High Sensitivity): 20 ng/L — ABNORMAL HIGH (ref <18)

## 2022-05-09 LAB — CBG MONITORING, ED
Glucose-Capillary: 39 mg/dL — CL (ref 70–99)
Glucose-Capillary: 120 mg/dL — ABNORMAL HIGH (ref 70–99)

## 2022-05-09 LAB — BASIC METABOLIC PANEL
Anion gap: 24 — ABNORMAL HIGH (ref 5–15)
BUN: 20 mg/dL (ref 8–23)
CO2: 21 mmol/L — ABNORMAL LOW (ref 22–32)
Calcium: 10.4 mg/dL — ABNORMAL HIGH (ref 8.9–10.3)
Chloride: 94 mmol/L — ABNORMAL LOW (ref 98–111)
Creatinine, Ser: 1.49 mg/dL — ABNORMAL HIGH (ref 0.44–1.00)
GFR, Estimated: 37 mL/min — ABNORMAL LOW (ref >60)
Glucose, Bld: 55 mg/dL — ABNORMAL LOW (ref 70–99)
Potassium: 2.7 mmol/L — CL (ref 3.5–5.1)
Sodium: 139 mmol/L (ref 135–145)

## 2022-05-09 LAB — CBC
HCT: 42.1 % (ref 36.0–46.0)
Hemoglobin: 14.2 g/dL (ref 12.0–15.0)
MCH: 26.2 pg (ref 26.0–34.0)
MCHC: 33.7 g/dL (ref 30.0–36.0)
MCV: 77.5 fL — ABNORMAL LOW (ref 80.0–100.0)
Platelets: 217 10*3/uL (ref 150–400)
RBC: 5.43 MIL/uL — ABNORMAL HIGH (ref 3.87–5.11)
RDW: 15.3 % (ref 11.5–15.5)
WBC: 7.5 10*3/uL (ref 4.0–10.5)
nRBC: 0 % (ref 0.0–0.2)

## 2022-05-09 LAB — MAGNESIUM: Magnesium: 2 mg/dL (ref 1.7–2.4)

## 2022-05-09 LAB — LIPASE, BLOOD: Lipase: 386 U/L — ABNORMAL HIGH (ref 11–51)

## 2022-05-09 MED ORDER — LACTATED RINGERS IV BOLUS
1000.0000 mL | Freq: Once | INTRAVENOUS | Status: AC
  Administered 2022-05-09: 1000 mL via INTRAVENOUS

## 2022-05-09 MED ORDER — ONDANSETRON HCL 4 MG/2ML IJ SOLN
4.0000 mg | Freq: Once | INTRAMUSCULAR | Status: AC
  Administered 2022-05-09: 4 mg via INTRAVENOUS
  Filled 2022-05-09: qty 2

## 2022-05-09 MED ORDER — PANTOPRAZOLE SODIUM 20 MG PO TBEC: 20.0000 mg | DELAYED_RELEASE_TABLET | Freq: Every day | ORAL | 0 refills | Status: DC

## 2022-05-09 MED ORDER — POTASSIUM CHLORIDE CRYS ER 20 MEQ PO TBCR
40.0000 meq | EXTENDED_RELEASE_TABLET | Freq: Once | ORAL | Status: DC
  Filled 2022-05-09: qty 2

## 2022-05-09 MED ORDER — DEXTROSE 50 % IV SOLN
1.0000 | Freq: Once | INTRAVENOUS | Status: AC
  Administered 2022-05-09: 50 mL via INTRAVENOUS
  Filled 2022-05-09: qty 50

## 2022-05-09 MED ORDER — MORPHINE SULFATE (PF) 4 MG/ML IV SOLN
4.0000 mg | Freq: Once | INTRAVENOUS | Status: AC
  Administered 2022-05-09: 4 mg via INTRAVENOUS
  Filled 2022-05-09: qty 1

## 2022-05-09 MED ORDER — POTASSIUM CHLORIDE 20 MEQ PO PACK: 20.0000 meq | PACK | Freq: Two times a day (BID) | ORAL | 0 refills | Status: DC

## 2022-05-09 MED ORDER — FAMOTIDINE 20 MG PO TABS
20.0000 mg | ORAL_TABLET | Freq: Once | ORAL | Status: AC
  Administered 2022-05-09: 20 mg via ORAL
  Filled 2022-05-09: qty 1

## 2022-05-09 MED ORDER — POTASSIUM CHLORIDE 20 MEQ PO PACK
40.0000 meq | PACK | Freq: Two times a day (BID) | ORAL | Status: DC
  Administered 2022-05-09: 40 meq via ORAL
  Filled 2022-05-09: qty 2

## 2022-05-09 MED ORDER — OXYCODONE HCL 5 MG PO TABS
10.0000 mg | ORAL_TABLET | Freq: Once | ORAL | Status: AC
  Administered 2022-05-09: 10 mg via ORAL
  Filled 2022-05-09 (×2): qty 2

## 2022-05-09 MED ORDER — IOHEXOL 350 MG/ML SOLN
80.0000 mL | Freq: Once | INTRAVENOUS | Status: AC | PRN
  Administered 2022-05-09: 80 mL via INTRAVENOUS

## 2022-05-09 MED ORDER — POTASSIUM CHLORIDE CRYS ER 20 MEQ PO TBCR: 20.0000 meq | EXTENDED_RELEASE_TABLET | Freq: Every day | ORAL | 0 refills | Status: DC

## 2022-05-09 MED ORDER — POTASSIUM CHLORIDE 10 MEQ/100ML IV SOLN
10.0000 meq | INTRAVENOUS | Status: AC
  Administered 2022-05-09 (×4): 10 meq via INTRAVENOUS
  Filled 2022-05-09 (×4): qty 100

## 2022-05-09 NOTE — ED Notes (Signed)
Provider notified of hypoglycemia

## 2022-05-09 NOTE — ED Provider Notes (Signed)
[facility] EMERGENCY DEPARTMENT Provider Note   CSN: [id number] Arrival date & time: 05/09/22  1146     History Chief Complaint  Patient presents with   Chest Pain    HPI Stephanie Benton is a 71 y.o. female presenting for chest pain, intermittent back pain, abdominal pain all of which have been present over the last 2 weeks.  She has tried to be seen in 3 different emergency departments this week but unfortunately due to protracted wait times she has been unable to be seen.  She denies fevers or chills, nausea vomiting, syncope or shortness of breath.  She does endorse worsening gastroesophageal reflux over the same time.  No known sick contacts.  No acute distress.  No other complaints at this time.   Patient's recorded medical, surgical, social, medication list and allergies were reviewed in the Snapshot window as part of the initial history.   Review of Systems   Review of Systems  Constitutional:  Positive for fatigue. Negative for chills and fever.  HENT:  Negative for ear pain and sore throat.   Eyes:  Negative for pain and visual disturbance.  Respiratory:  Negative for cough and shortness of breath.   Cardiovascular:  Positive for chest pain. Negative for palpitations.  Gastrointestinal:  Positive for abdominal pain. Negative for vomiting.  Genitourinary:  Negative for dysuria and hematuria.  Musculoskeletal:  Positive for back pain. Negative for arthralgias.  Skin:  Negative for color change and rash.  Neurological:  Negative for seizures and syncope.  All other systems reviewed and are negative.   Physical Exam Updated Vital Signs BP 137/85   Pulse 95   Temp 98.5 F (36.9 C)   Resp (!) 9   Ht 5\' 5"  (1.651 m)   Wt 103 kg   SpO2 100%   BMI 37.77 kg/m  Physical Exam Vitals and nursing note reviewed.  Constitutional:      General: She is not in acute distress.    Appearance: She is well-developed.  HENT:     Head: Normocephalic and atraumatic.   Eyes:     Conjunctiva/sclera: Conjunctivae normal.  Cardiovascular:     Rate and Rhythm: Normal rate and regular rhythm.     Heart sounds: No murmur heard. Pulmonary:     Effort: Pulmonary effort is normal. No respiratory distress.     Breath sounds: Normal breath sounds.  Abdominal:     Palpations: Abdomen is soft.     Tenderness: There is no abdominal tenderness.  Musculoskeletal:        General: No swelling.     Cervical back: Neck supple.  Skin:    General: Skin is warm and dry.     Capillary Refill: Capillary refill takes less than 2 seconds.  Neurological:     Mental Status: She is alert.  Psychiatric:        Mood and Affect: Mood normal.      ED Course/ Medical Decision Making/ A&P Clinical Course as of 05/09/22 2120  Thu May 09, 2022  1448 Troponin I (High Sensitivity)(!): 26 [HN]  1448 Potassium(!!): 2.7 Repleting [HN]  1449 Patient has had 3 sets of labs drawn from 3 days.  [HN]    Clinical Course User Index [HN] May 11, 2022, MD    Procedures Procedures   Medications Ordered in ED Medications  potassium chloride 10 mEq in 100 mL IVPB (0 mEq Intravenous Stopped 05/09/22 2000)  dextrose 50 % solution 50 mL (50 mLs Intravenous  Given 05/09/22 1507)  morphine (PF) 4 MG/ML injection 4 mg (4 mg Intravenous Given 05/09/22 1537)  ondansetron (ZOFRAN) injection 4 mg (4 mg Intravenous Given 05/09/22 1537)  lactated ringers bolus 1,000 mL (0 mLs Intravenous Stopped 05/09/22 1955)  iohexol (OMNIPAQUE) 350 MG/ML injection 80 mL (80 mLs Intravenous Contrast Given 05/09/22 1615)  dextrose 50 % solution 50 mL (50 mLs Intravenous Given 05/09/22 1803)  oxyCODONE (Oxy IR/ROXICODONE) immediate release tablet 10 mg (10 mg Oral Given 05/09/22 2013)  famotidine (PEPCID) tablet 20 mg (20 mg Oral Given 05/09/22 2120)  potassium chloride SA (KLOR-CON M) CR tablet 40 mEq (40 mEq Oral Given 05/09/22 2120)    Medical Decision Making:    Stephanie Benton is a 71 y.o. female who presented  to the ED today with multiple concerns detailed above.     Patient's presentation is complicated by their history of advanced age,COPD, chest pain in the past, bipolar disorder, hypercholesterolemia, elevated BMI.  Patient placed on continuous vitals and telemetry monitoring while in ED which was reviewed periodically.   Complete initial physical exam performed, notably the patient  was hemodynamically stable in no acute distress.  Patient ambulatory, tolerating p.o. intake.      Reviewed and confirmed nursing documentation for past medical history, family history, social history.    Initial Assessment and plan:   This is most consistent with an acute life/limb threatening illness complicated by underlying chronic conditions. Patient's history of present illness and physical exam findings are difficult to pinpoint to an exact location.  She has intermittent chest, abdomen, pelvis, flank, diffuse musculoskeletal pain.  It is atypical for ACS though this is considered possible given her otherwise concerning risk factors including history of obesity, smoking and advanced age though her troponin was reassuring today giving her total heart score of 4.  Informed patient of these findings and had a shared medical decision making regarding transfer to another hospital for ongoing cardiac evaluation versus outpatient care management.  Patient requested discharge as her symptoms had grossly improved and she would plan to follow-up with a cardiologist. Additionally, concerning her chest pain, CTA PE study was performed to evaluate for intrathoracic pathology, disease, or other abnormality.  No evidence of pulmonary embolism, aortic dissection or any other etiology of patient's pain. Furthermore, CT abdomen pelvis was performed due to her ongoing abdominal flank pain and lack of ability to obtain outpatient follow-up prior to this visit and diffuse nature of symptoms.  CT abdomen pelvis did not reveal any other  focal etiology of patient's symptoms.  After symptomatic management in the emergency department, she has had complete resolution of syndrome.  No pain at this time.  She appeared clinically dehydrated on exam with a anion gap acidosis on presentation as well as hypokalemia consistent with her diarrhea and poor p.o. intake.  Hypokalemia repleted p.o. and IV in the emergency department.  Multiple IV fluid boluses and dextrose boluses given until patient was tolerating p.o. intake. Final Assessment and Plan:   Again patient was reevaluated at bedside.  After extensive evaluation in emergency department, her syndrome remains nonspecific.  I have discussed her cardiac risk and importance of further cardiology evaluation above and she has elected for outpatient follow-up with a cardiologist at this time. Concerning her abdominal pain, this may be developing peptic ulcer disease but there is no evidence of acute emergency at this time.  Will prescribe PPI and recommend patient follow-up with a gastroenterologist in the outpatient setting as well. She is  symptomatically resolved after administration of medications in the emergency department and she is not tolerating p.o. intake in no acute distress.  Offered patient further emergency department observation that she has been here for 9-1/2 hours and is requesting discharge.  Informed her that her differential diagnosis still remains broad and we do not have any clear diagnosis and informed her of her risk for other missed severe illness and she expressed understanding but feels comfortable with discharge at this time preferring to be discharged to go home and rest with plan to follow-up with specialist and primary care in the outpatient setting.  Ultimately this is reasonable, will discharge patient at this time and recommend she return if her symptoms recur again.    Clinical Impression:  1. Chest pain, unspecified type      Data Unavailable   Final Clinical  Impression(s) / ED Diagnoses Final diagnoses:  Chest pain, unspecified type    Rx / DC Orders ED Discharge Orders          Ordered    pantoprazole (PROTONIX) 20 MG tablet  Daily        05/09/22 2114    potassium chloride SA (KLOR-CON M) 20 MEQ tablet  Daily        05/09/22 2114              Tretha Sciara, MD 05/09/22 2327

## 2022-05-09 NOTE — ED Triage Notes (Signed)
Patient c/o left chest pressure x 2 weeks.  Patient was seen yesterday and on the 2nd for same.

## 2022-05-09 NOTE — ED Notes (Signed)
Pt left due to long wait times  

## 2022-05-09 NOTE — ED Notes (Signed)
Multiple attempts for IV access, 2 RNs tried , no success.

## 2022-05-16 ENCOUNTER — Encounter (HOSPITAL_COMMUNITY): Payer: Self-pay | Admitting: Emergency Medicine

## 2022-05-16 ENCOUNTER — Other Ambulatory Visit: Payer: Self-pay

## 2022-05-16 ENCOUNTER — Emergency Department (HOSPITAL_COMMUNITY): Payer: Medicare Other

## 2022-05-16 ENCOUNTER — Inpatient Hospital Stay (HOSPITAL_COMMUNITY)
Admission: EM | Admit: 2022-05-16 | Discharge: 2022-05-23 | DRG: 417 | Disposition: A | Discharge status: Home / Self Care | Payer: Medicare Other | Attending: Family Medicine | Admitting: Family Medicine

## 2022-05-16 DIAGNOSIS — G8929 Other chronic pain: Secondary | ICD-10-CM | POA: Diagnosis present

## 2022-05-16 DIAGNOSIS — F17210 Nicotine dependence, cigarettes, uncomplicated: Secondary | ICD-10-CM | POA: Diagnosis present

## 2022-05-16 DIAGNOSIS — E860 Dehydration: Secondary | ICD-10-CM | POA: Diagnosis not present

## 2022-05-16 DIAGNOSIS — Z8249 Family history of ischemic heart disease and other diseases of the circulatory system: Secondary | ICD-10-CM

## 2022-05-16 DIAGNOSIS — K3189 Other diseases of stomach and duodenum: Secondary | ICD-10-CM | POA: Diagnosis present

## 2022-05-16 DIAGNOSIS — E669 Obesity, unspecified: Secondary | ICD-10-CM | POA: Diagnosis present

## 2022-05-16 DIAGNOSIS — K859 Acute pancreatitis without necrosis or infection, unspecified: Secondary | ICD-10-CM | POA: Diagnosis not present

## 2022-05-16 DIAGNOSIS — E785 Hyperlipidemia, unspecified: Secondary | ICD-10-CM | POA: Diagnosis present

## 2022-05-16 DIAGNOSIS — I129 Hypertensive chronic kidney disease with stage 1 through stage 4 chronic kidney disease, or unspecified chronic kidney disease: Secondary | ICD-10-CM | POA: Diagnosis present

## 2022-05-16 DIAGNOSIS — K8010 Calculus of gallbladder with chronic cholecystitis without obstruction: Secondary | ICD-10-CM | POA: Diagnosis not present

## 2022-05-16 DIAGNOSIS — K660 Peritoneal adhesions (postprocedural) (postinfection): Secondary | ICD-10-CM | POA: Diagnosis present

## 2022-05-16 DIAGNOSIS — R7989 Other specified abnormal findings of blood chemistry: Secondary | ICD-10-CM

## 2022-05-16 DIAGNOSIS — E8729 Other acidosis: Secondary | ICD-10-CM

## 2022-05-16 DIAGNOSIS — Z882 Allergy status to sulfonamides status: Secondary | ICD-10-CM

## 2022-05-16 DIAGNOSIS — K760 Fatty (change of) liver, not elsewhere classified: Secondary | ICD-10-CM | POA: Diagnosis present

## 2022-05-16 DIAGNOSIS — E876 Hypokalemia: Secondary | ICD-10-CM | POA: Diagnosis present

## 2022-05-16 DIAGNOSIS — Z8711 Personal history of peptic ulcer disease: Secondary | ICD-10-CM

## 2022-05-16 DIAGNOSIS — Z7951 Long term (current) use of inhaled steroids: Secondary | ICD-10-CM

## 2022-05-16 DIAGNOSIS — N179 Acute kidney failure, unspecified: Secondary | ICD-10-CM | POA: Diagnosis present

## 2022-05-16 DIAGNOSIS — Z880 Allergy status to penicillin: Secondary | ICD-10-CM

## 2022-05-16 DIAGNOSIS — K449 Diaphragmatic hernia without obstruction or gangrene: Secondary | ICD-10-CM | POA: Diagnosis present

## 2022-05-16 DIAGNOSIS — Z888 Allergy status to other drugs, medicaments and biological substances status: Secondary | ICD-10-CM

## 2022-05-16 DIAGNOSIS — K2101 Gastro-esophageal reflux disease with esophagitis, with bleeding: Secondary | ICD-10-CM | POA: Diagnosis present

## 2022-05-16 DIAGNOSIS — E872 Acidosis, unspecified: Secondary | ICD-10-CM | POA: Diagnosis present

## 2022-05-16 DIAGNOSIS — Z91013 Allergy to seafood: Secondary | ICD-10-CM

## 2022-05-16 DIAGNOSIS — K209 Esophagitis, unspecified without bleeding: Secondary | ICD-10-CM | POA: Diagnosis present

## 2022-05-16 DIAGNOSIS — R079 Chest pain, unspecified: Secondary | ICD-10-CM

## 2022-05-16 DIAGNOSIS — K828 Other specified diseases of gallbladder: Secondary | ICD-10-CM | POA: Diagnosis present

## 2022-05-16 DIAGNOSIS — F3178 Bipolar disorder, in full remission, most recent episode mixed: Secondary | ICD-10-CM | POA: Diagnosis present

## 2022-05-16 DIAGNOSIS — J45909 Unspecified asthma, uncomplicated: Secondary | ICD-10-CM | POA: Diagnosis present

## 2022-05-16 DIAGNOSIS — Z20822 Contact with and (suspected) exposure to covid-19: Secondary | ICD-10-CM | POA: Diagnosis present

## 2022-05-16 DIAGNOSIS — H409 Unspecified glaucoma: Secondary | ICD-10-CM | POA: Diagnosis present

## 2022-05-16 DIAGNOSIS — N1831 Chronic kidney disease, stage 3a: Secondary | ICD-10-CM | POA: Diagnosis present

## 2022-05-16 DIAGNOSIS — K2971 Gastritis, unspecified, with bleeding: Secondary | ICD-10-CM | POA: Diagnosis present

## 2022-05-16 DIAGNOSIS — I10 Essential (primary) hypertension: Secondary | ICD-10-CM

## 2022-05-16 DIAGNOSIS — Z885 Allergy status to narcotic agent status: Secondary | ICD-10-CM

## 2022-05-16 DIAGNOSIS — Z79899 Other long term (current) drug therapy: Secondary | ICD-10-CM

## 2022-05-16 DIAGNOSIS — Z6837 Body mass index (BMI) 37.0-37.9, adult: Secondary | ICD-10-CM

## 2022-05-16 LAB — CBC WITH DIFFERENTIAL/PLATELET
Abs Immature Granulocytes: 0.03 10*3/uL (ref 0.00–0.07)
Basophils Absolute: 0 10*3/uL (ref 0.0–0.1)
Basophils Relative: 1 %
Eosinophils Absolute: 0 10*3/uL (ref 0.0–0.5)
Eosinophils Relative: 0 %
HCT: 41.8 % (ref 36.0–46.0)
Hemoglobin: 14.4 g/dL (ref 12.0–15.0)
Immature Granulocytes: 0 %
Lymphocytes Relative: 15 %
Lymphs Abs: 1.1 10*3/uL (ref 0.7–4.0)
MCH: 26.5 pg (ref 26.0–34.0)
MCHC: 34.4 g/dL (ref 30.0–36.0)
MCV: 76.8 fL — ABNORMAL LOW (ref 80.0–100.0)
Monocytes Absolute: 0.7 10*3/uL (ref 0.1–1.0)
Monocytes Relative: 10 %
Neutro Abs: 5.2 10*3/uL (ref 1.7–7.7)
Neutrophils Relative %: 74 %
Platelets: 299 10*3/uL (ref 150–400)
RBC: 5.44 MIL/uL — ABNORMAL HIGH (ref 3.87–5.11)
RDW: 14.1 % (ref 11.5–15.5)
WBC: 7.1 10*3/uL (ref 4.0–10.5)
nRBC: 0 % (ref 0.0–0.2)

## 2022-05-16 LAB — LIPASE, BLOOD: Lipase: 309 U/L — ABNORMAL HIGH (ref 11–51)

## 2022-05-16 LAB — COMPREHENSIVE METABOLIC PANEL
ALT: 25 U/L (ref 0–44)
AST: 39 U/L (ref 15–41)
Albumin: 3.9 g/dL (ref 3.5–5.0)
Alkaline Phosphatase: 94 U/L (ref 38–126)
Anion gap: 27 — ABNORMAL HIGH (ref 5–15)
BUN: 21 mg/dL (ref 8–23)
CO2: 21 mmol/L — ABNORMAL LOW (ref 22–32)
Calcium: 10.1 mg/dL (ref 8.9–10.3)
Chloride: 87 mmol/L — ABNORMAL LOW (ref 98–111)
Creatinine, Ser: 2.05 mg/dL — ABNORMAL HIGH (ref 0.44–1.00)
GFR, Estimated: 25 mL/min — ABNORMAL LOW (ref >60)
Glucose, Bld: 107 mg/dL — ABNORMAL HIGH (ref 70–99)
Potassium: 3 mmol/L — ABNORMAL LOW (ref 3.5–5.1)
Sodium: 135 mmol/L (ref 135–145)
Total Bilirubin: 2 mg/dL — ABNORMAL HIGH (ref 0.3–1.2)
Total Protein: 7.2 g/dL (ref 6.5–8.1)

## 2022-05-16 LAB — TROPONIN I (HIGH SENSITIVITY)
Troponin I (High Sensitivity): 31 ng/L — ABNORMAL HIGH (ref <18)
Troponin I (High Sensitivity): 28 ng/L — ABNORMAL HIGH (ref <18)

## 2022-05-16 LAB — LACTIC ACID, PLASMA: Lactic Acid, Venous: 1.5 mmol/L (ref 0.5–1.9)

## 2022-05-16 MED ORDER — HYDROMORPHONE HCL 1 MG/ML IJ SOLN
0.5000 mg | Freq: Once | INTRAMUSCULAR | Status: AC
  Administered 2022-05-16: 0.5 mg via INTRAVENOUS
  Filled 2022-05-16: qty 1

## 2022-05-16 MED ORDER — LACTATED RINGERS IV BOLUS
1000.0000 mL | Freq: Once | INTRAVENOUS | Status: AC
  Administered 2022-05-16: 1000 mL via INTRAVENOUS

## 2022-05-16 MED ORDER — HYDROMORPHONE HCL 1 MG/ML IJ SOLN
0.5000 mg | Freq: Once | INTRAMUSCULAR | Status: AC
  Administered 2022-05-16: 0.5 mg via INTRAMUSCULAR
  Filled 2022-05-16: qty 1

## 2022-05-16 MED ORDER — POTASSIUM CHLORIDE 10 MEQ/100ML IV SOLN
10.0000 meq | INTRAVENOUS | Status: AC
  Administered 2022-05-16 (×2): 10 meq via INTRAVENOUS
  Filled 2022-05-16 (×2): qty 100

## 2022-05-16 MED ORDER — ONDANSETRON 4 MG PO TBDP
4.0000 mg | ORAL_TABLET | Freq: Once | ORAL | Status: AC
  Administered 2022-05-16: 4 mg via ORAL
  Filled 2022-05-16: qty 1

## 2022-05-16 MED ORDER — HYDROCODONE-ACETAMINOPHEN 5-325 MG PO TABS
1.0000 | ORAL_TABLET | Freq: Once | ORAL | Status: DC
  Filled 2022-05-16: qty 1

## 2022-05-16 MED ORDER — LACTATED RINGERS IV SOLN: INTRAVENOUS | Status: DC

## 2022-05-16 MED ORDER — METOCLOPRAMIDE HCL 5 MG/ML IJ SOLN
10.0000 mg | Freq: Once | INTRAMUSCULAR | Status: AC
  Administered 2022-05-16: 10 mg via INTRAVENOUS
  Filled 2022-05-16: qty 2

## 2022-05-16 NOTE — ED Provider Triage Note (Signed)
Emergency Medicine Provider Triage Evaluation Note  SHAQUEL JOSEPHSON , a 71 y.o. female  was evaluated in triage.  Pt complains of worsening upper abdominal/chest wall pain.  Symptoms described as stronger "throbbing" sensation.  Has been evaluated for this 3-4 times in the ED over the last 1 to 2 weeks.  Has continued to have negative work-ups.  No new symptoms.  His fevers, chills, diarrhea.  Though endorses decreased urinary output.  Review of Systems  Positive:  Negative: See above  Physical Exam  BP 118/88 (BP Location: Right Arm)   Pulse (!) 116   Temp 99.3 F (37.4 C)   Resp 18   SpO2 98%  Gen:   Awake, no distress, appears mildly uncomfortable Resp:  Normal effort, equal chest rise MSK:   Moves extremities without difficulty  Other:  Chest wall TTP, abdomen soft, nontender.  Medical Decision Making  Medically screening exam initiated at 3:19 PM.  Appropriate orders placed.  PAYSEN GOZA was informed that the remainder of the evaluation will be completed by another provider, this initial triage assessment does not replace that evaluation, and the importance of remaining in the ED until their evaluation is complete.     Prince Rome, PA-C 37/04/88 1523

## 2022-05-16 NOTE — ED Notes (Signed)
Pt vomiting spit like secretions into emesis bag

## 2022-05-16 NOTE — H&P (Signed)
Stephanie Benton ZYS:[id number] DOB: 11-Jan-1951 DOA: 05/16/2022     PCP: [name], MD   Outpatient Specialists:   NONE    Patient arrived to ER on 05/16/22 at 1426 Referred by Attending Blanchie Dessert, MD   Patient coming from:    home Lives alone,        Chief Complaint:   Chief Complaint  Patient presents with   Vomiting   Abnormal Lab    HPI: Stephanie Benton is a 71 y.o. female with medical history significant of HTN, HLD, asthma, glaucoma, GERD,  Presented with   nausea vomiting diarrhea Brought in from home with nausea vomiting diarrhea for the past 2 weeks with intermittent chest pain patient has been told in the past has pancreatitis and also transaminases. Has been feeling unwell for at least 4 weeks and getting worse.  She was seen about a week ago if elevated lipase and low potassium how appointment with potassium was replaced she had a work-up done of CTA of her chest and CT of abdomen which was unremarkable she was able to be discharged to home but never got better. She has nausea and vomiting every time she eats.  Vomiting numerous times per day making small stool. No hematemesis no fever she went to her primary care today and was told to go to emergency department secondary to abnormal labs. Patient does not smoke or drink Patient does use BC powders daily  Actually pt now states she uses tramadol for pain instead of BC powders Reports the water does stay down but something heavy will not  Used to smoke but quit 1 m ago Regarding pertinent Chronic problems:     Hyperlipidemia -  on statins Zocor (simvastatin)  Lipid Panel     Component Value Date/Time   CHOL (H) 01/12/2010 0640    223        ATP III CLASSIFICATION:  <200     mg/dL   Desirable  200-239  mg/dL   Borderline High  >=240    mg/dL   High          TRIG 78 01/12/2010 0640   HDL 48 01/12/2010 0640   CHOLHDL 4.6 01/12/2010 0640   VLDL 16 01/12/2010 0640   LDLCALC (H) 01/12/2010  0640    159        Total Cholesterol/HDL:CHD Risk Coronary Heart Disease Risk Table                     Men   Women  1/2 Average Risk   3.4   3.3  Average Risk       5.0   4.4  2 X Average Risk   9.6   7.1  3 X Average Risk  23.4   11.0        Use the calculated Patient Ratio above and the CHD Risk Table to determine the patient's CHD Risk.        ATP III CLASSIFICATION (LDL):  <100     mg/dL   Optimal  100-129  mg/dL   Near or Above                    Optimal  130-159  mg/dL   Borderline  160-189  mg/dL   High  >190     mg/dL   Very High     HTN on micardis    obesity-   BMI Readings from Last 1 Encounters:  05/09/22 37.77 kg/m       Asthma -well   controlled on home inhalers/ nebs f              While in ER:    Ultrasound showed cholelithiasis and sludge with Percell Miller sign but no gallbladder wall thickening.  Hepatic steatosis also noted    CXR -  NON acute    Following Medications were ordered in ER: Medications  lactated ringers infusion (has no administration in time range)  potassium chloride 10 mEq in 100 mL IVPB (10 mEq Intravenous New Bag/Given 05/16/22 2237)  ondansetron (ZOFRAN-ODT) disintegrating tablet 4 mg (4 mg Oral Given 05/16/22 1537)  HYDROmorphone (DILAUDID) injection 0.5 mg (0.5 mg Intramuscular Given 05/16/22 1544)  lactated ringers bolus 1,000 mL (1,000 mLs Intravenous New Bag/Given 05/16/22 2236)  HYDROmorphone (DILAUDID) injection 0.5 mg (0.5 mg Intravenous Given 05/16/22 2235)  metoCLOPramide (REGLAN) injection 10 mg (10 mg Intravenous Given 05/16/22 2236)    _______________________________________________________ ER sent msg   Lb Gi    Dr. Lorenso Courier They Recommend admit to medicine   Will see in AM     ED Triage Vitals  Enc Vitals Group     BP 05/16/22 1441 118/88     Pulse Rate 05/16/22 1441 (!) 116     Resp 05/16/22 1441 18     Temp 05/16/22 1441 99.3 F (37.4 C)     Temp Source 05/16/22 1625 Oral     SpO2 05/16/22 1441 98 %      Weight --      Height --      Head Circumference --      Peak Flow --      Pain Score 05/16/22 1434 0     Pain Loc --      Pain Edu? --      Excl. in [place]? --   TMAX(24)@     _________________________________________ Significant initial  Findings: Abnormal Labs Reviewed  CBC WITH DIFFERENTIAL/PLATELET - Abnormal; Notable for the following components:      Result Value   RBC 5.44 (*)    MCV 76.8 (*)    All other components within normal limits  COMPREHENSIVE METABOLIC PANEL - Abnormal; Notable for the following components:   Potassium 3.0 (*)    Chloride 87 (*)    CO2 21 (*)    Glucose, Bld 107 (*)    Creatinine, Ser 2.05 (*)    Total Bilirubin 2.0 (*)    GFR, Estimated 25 (*)    Anion gap 27 (*)    All other components within normal limits  LIPASE, BLOOD - Abnormal; Notable for the following components:   Lipase 309 (*)    All other components within normal limits  TROPONIN I (HIGH SENSITIVITY) - Abnormal; Notable for the following components:   Troponin I (High Sensitivity) 31 (*)    All other components within normal limits  TROPONIN I (HIGH SENSITIVITY) - Abnormal; Notable for the following components:   Troponin I (High Sensitivity) 28 (*)    All other components within normal limits     _________________________ Troponin 28-31 ECG: Ordered Personally reviewed and interpreted by me showing: HR : 122 Rhythm: Sinus tachycardia Nonspecific ST abnormality Abnormal ECG When compared QTC 464   WBC     Component Value Date/Time   WBC 7.1 05/16/2022 1516   LYMPHSABS 1.1 05/16/2022 1516   MONOABS 0.7 05/16/2022 1516   EOSABS 0.0 05/16/2022 1516   BASOSABS 0.0 05/16/2022 1516  Lactic Acid, Venous    Component Value Date/Time   LATICACIDVEN 1.5 05/16/2022 2230     Procalcitonin   Ordered     UA  ordered     Results for orders placed or performed during the hospital encounter of 10/11/19  SARS CORONAVIRUS 2 (TAT 6-24 HRS) Nasopharyngeal Nasopharyngeal  Swab     Status: None   Collection Time: 10/11/19  9:29 PM   Specimen: Nasopharyngeal Swab  Result Value Ref Range Status   SARS Coronavirus 2 NEGATIVE NEGATIVE Final    Comment: (NOTE) SARS-CoV-2 target nucleic acids are NOT DETECTED. The SARS-CoV-2 RNA is generally detectable in upper and lower respiratory specimens during the acute phase of infection. Negative results do not preclude SARS-CoV-2 infection, do not rule out co-infections with other pathogens, and should not be used as the sole basis for treatment or other patient management decisions. Negative results must be combined with clinical observations, patient history, and epidemiological information. The expected result is Negative. Fact Sheet for Patients: SugarRoll.be Fact Sheet for Healthcare Providers: https://www.woods-mathews.com/ This test is not yet approved or cleared by the [place] FDA and  has been authorized for detection and/or diagnosis of SARS-CoV-2 by FDA under an Emergency Use Authorization (EUA). This EUA will remain  in effect (meaning this test can be used) for the duration of the COVID-19 declaration under Section 56 4(b)(1) of the Act, 21 U.S.C. section 360bbb-3(b)(1), unless the authorization is terminated or revoked sooner. Performed at [facility], [place] [address], [place], [place] [address]      _______________________________________________ Hospitalist was called for admission for   Hypokalemia,   AKI (acute kidney injury)  Increased anion gap metabolic acidosis  Acute pancreatitis without infection or necrosis, unspecified pancreatitis type    Dehydration      The following Work up has been ordered so far:  Orders Placed This Encounter  Procedures   DG Chest 1 View   US Abdomen Limited RUQ (LIVER/GB)   CBC with Differential   Comprehensive metabolic panel   Lipase, blood   Urinalysis, Routine w reflex microscopic   Lactic  acid, plasma   Consult for [facility] Admission   ED EKG   Insert peripheral IV     OTHER Significant initial  Findings:  labs showing:    Recent Labs  Lab 05/16/22 1516  NA 135  K 3.0*  CO2 21*  GLUCOSE 107*  BUN 21  CREATININE 2.05*  CALCIUM 10.1    Cr    Up from baseline see below Lab Results  Component Value Date   CREATININE 2.05 (H) 05/16/2022   CREATININE 1.49 (H) 05/09/2022   CREATININE 1.61 (H) 05/08/2022    Recent Labs  Lab 05/16/22 1516  AST 39  ALT 25  ALKPHOS 94  BILITOT 2.0*  PROT 7.2  ALBUMIN 3.9   Lab Results  Component Value Date   CALCIUM 10.1 05/16/2022   PHOS 3.9 01/12/2010          Plt: Lab Results  Component Value Date   PLT 299 05/16/2022      COVID-19 Labs  No results for input(s): "DDIMER", "FERRITIN", "LDH", "CRP" in the last 72 hours.  Lab Results  Component Value Date   SARSCOV2NAA NEGATIVE 10/11/2019     Recent Labs  Lab 05/16/22 1516  WBC 7.1  NEUTROABS 5.2  HGB 14.4  HCT 41.8  MCV 76.8*  PLT 299    HG/HCT stable,       Component Value  Date/Time   HGB 14.4 05/16/2022 1516   HCT 41.8 05/16/2022 1516   MCV 76.8 (L) 05/16/2022 1516     Recent Labs  Lab 05/16/22 1516  LIPASE 309*   No results for input(s): "AMMONIA" in the last 168 hours.    Cardiac Panel (last 3 results) No results for input(s): "CKTOTAL", "CKMB", "TROPONINI", "RELINDX" in the last 72 hours.  .car BNP (last 3 results) Recent Labs    05/08/22 2023  BNP 19.7      DM  labs:  HbA1C: No results for input(s): "HGBA1C" in the last 8760 hours.     CBG (last 3)  No results for input(s): "GLUCAP" in the last 72 hours.        Cultures:    Component Value Date/Time   SDES URINE, CLEAN CATCH 01/11/2010 1841   SPECREQUEST ADDED ON [id number] @2305  01/11/2010 1841   CULT  01/11/2010 1841    Multiple bacterial morphotypes present, none predominant. Suggest appropriate recollection if clinically indicated.   REPTSTATUS  01/13/2010 FINAL 01/11/2010 1841     Radiological Exams on Admission: US Abdomen Limited RUQ (LIVER/GB)  Result Date: 05/16/2022 CLINICAL DATA:  Right upper quadrant pain, nausea/vomiting. EXAM: ULTRASOUND ABDOMEN LIMITED RIGHT UPPER QUADRANT COMPARISON:  05/09/2022. FINDINGS: Gallbladder: Stones and sludge are present within the gallbladder. No gallbladder wall thickening or pericholecystic fluid. A Murphy sign is noted by sonographer. Common bile duct: Diameter: 3.6 mm. Liver: No focal lesion identified. Increased parenchymal echogenicity. Portal vein is patent on color Doppler imaging with normal direction of blood flow towards the liver. Other: No free fluid. IMPRESSION: 1. Cholelithiasis and sludge in the gallbladder with positive Murphy sign. No gallbladder wall thickening or pericholecystic edema. Clinical correlation is recommended to exclude acute cholecystitis. 2. Hepatic steatosis. Electronically Signed   By: Brett Fairy M.D.   On: 05/16/2022 22:12   DG Chest 1 View  Result Date: 05/16/2022 CLINICAL DATA:  Chest pain EXAM: CHEST  1 VIEW COMPARISON:  Chest 05/09/2022 FINDINGS: Heart size and vascularity normal. Lungs clear without infiltrate effusion. Moderate to large hiatal hernia. IMPRESSION: No active disease. Electronically Signed   By: Franchot Gallo M.D.   On: 05/16/2022 16:21   _______________________________________________________________________________________________________ Latest  Blood pressure 133/73, pulse 86, temperature 99 F (37.2 C), temperature source Oral, resp. rate 14, SpO2 97 %.   Vitals  labs and radiology finding personally reviewed  Review of Systems:    Pertinent positives include:   abdominal pain, nausea, vomiting,  Constitutional:  No weight loss, night sweats, Fevers, chills, fatigue, weight loss  HEENT:  No headaches, Difficulty swallowing,Tooth/dental problems,Sore throat,  No sneezing, itching, ear ache, nasal congestion, post nasal drip,   Cardio-vascular:  No chest pain, Orthopnea, PND, anasarca, dizziness, palpitations.no Bilateral lower extremity swelling  GI:  No heartburn, indigestion, diarrhea, change in bowel habits, loss of appetite, melena, blood in stool, hematemesis Resp:  no shortness of breath at rest. No dyspnea on exertion, No excess mucus, no productive cough, No non-productive cough, No coughing up of blood.No change in color of mucus.No wheezing. Skin:  no rash or lesions. No jaundice GU:  no dysuria, change in color of urine, no urgency or frequency. No straining to urinate.  No flank pain.  Musculoskeletal:  No joint pain or no joint swelling. No decreased range of motion. No back pain.  Psych:  No change in mood or affect. No depression or anxiety. No memory loss.  Neuro: no localizing neurological complaints, no tingling, no weakness, no  double vision, no gait abnormality, no slurred speech, no confusion  All systems reviewed and apart from HOPI all are negative _______________________________________________________________________________________________ Past Medical History:   Past Medical History:  Diagnosis Date   Hyperlipidemia    Hypertension       Past Surgical History:  Procedure Laterality Date   ABDOMINAL HYSTERECTOMY     APPENDECTOMY     HERNIA REPAIR     TONSILLECTOMY     TUBAL LIGATION      Social History:  Ambulatory   cane     reports that she has been smoking cigarettes. She has been smoking an average of .75 packs per day. She has never used smokeless tobacco. She reports that she does not drink alcohol and does not use drugs.     Family History:   Family History  Problem Relation Age of Onset   Heart failure Mother    Cancer Father    Cancer Sister    ______________________________________________________________________________________________ Allergies: Allergies  Allergen Reactions   Fish-Derived Products Anaphylaxis   Morphine And Related  Anaphylaxis and Swelling   Belsomra [Suvorexant] Other (See Comments)    Caused nightmares   Codeine Nausea And Vomiting   Fentanyl Hives   Ibuprofen Nausea Only and Other (See Comments)    Severe nausea- history of stomach ulcers   Nsaids Other (See Comments)    History of stomach ulcers   Other Other (See Comments)    History of stomach ulcers   Penicillins Hives    Did it involve swelling of the face/tongue/throat, SOB, or low BP? Yes Did it involve sudden or severe rash/hives, skin peeling, or any reaction on the inside of your mouth or nose? Yes Did you need to seek medical attention at a hospital or doctor's office? Yes When did it last happen? More than 10 years ago If all above answers are "NO", may proceed with cephalosporin use.    Sulfa Antibiotics Swelling    Site of swelling not recalled   Tolmetin Other (See Comments)    History of stomach ulcers     Prior to Admission medications   Medication Sig Start Date End Date Taking? Authorizing Provider  albuterol (PROVENTIL) (2.5 MG/3ML) 0.083% nebulizer solution Take 3 mLs (2.5 mg total) by nebulization every 6 (six) hours as needed for wheezing or shortness of breath. Patient not taking: Reported on 10/11/2019 11/27/14   Elson Areas, PA-C  albuterol (VENTOLIN HFA) 108 (90 Base) MCG/ACT inhaler Inhale 2 puffs into the lungs every 6 (six) hours as needed for wheezing or shortness of breath.    [provider]  bumetanide (BUMEX) 1 MG tablet Take 1 mg by mouth daily.  05/26/19   [provider]  busPIRone (BUSPAR) 7.5 MG tablet Take 7.5 mg by mouth 3 (three) times daily.  07/13/18   [provider]  cyanocobalamin (,VITAMIN B-12,) 1000 MCG/ML injection Inject 1,000 mcg into the muscle every 30 (thirty) days.  06/08/19   [provider]  DEXILANT 60 MG capsule Take 60 mg by mouth every evening.  05/05/13   [provider]  diclofenac sodium (VOLTAREN) 1 % GEL Apply 2-4 g topically 4  (four) times daily as needed (for pain).  12/04/17   [provider]  furosemide (LASIX) 20 MG tablet Take 0.5 tablets (10 mg total) by mouth daily for 4 days. 10/12/19 10/16/19  Long, Arlyss Repress, MD  ketoconazole (NIZORAL) 2 % cream Apply 1 fingertip amount to each foot daily. Patient not taking:  Reported on 10/11/2019 04/23/18   Evelina Bucy, DPM  latanoprost (XALATAN) 0.005 % ophthalmic solution Place 1 drop into both eyes at bedtime. 02/16/18   [provider]  LYRICA 100 MG capsule Take 100 mg by mouth 3 (three) times daily.  05/05/13   [provider]  nicotine (NICODERM CQ - DOSED IN MG/24 HOURS) 21 mg/24hr patch Place 1 patch (21 mg total) onto the skin daily. Patient not taking: Reported on 10/11/2019 02/19/18   Dhungel, Flonnie Overman, MD  pantoprazole (PROTONIX) 20 MG tablet Take 1 tablet (20 mg total) by mouth daily. 05/09/22 06/08/22  Tretha Sciara, MD  potassium chloride (KLOR-CON) 20 MEQ packet Take 20 mEq by mouth 2 (two) times daily for 5 days. 05/09/22 05/14/22  Tretha Sciara, MD  risperiDONE (RISPERDAL) 1 MG tablet Take 1 mg by mouth at bedtime.  11/24/14   [provider]  sertraline (ZOLOFT) 100 MG tablet Take 100 mg by mouth at bedtime.  05/05/13   [provider]  simvastatin (ZOCOR) 40 MG tablet Take 40 mg by mouth at bedtime.  05/09/15   [provider]  SYMBICORT 160-4.5 MCG/ACT inhaler Inhale 1 puff into the lungs 2 (two) times daily as needed (for flares).  01/28/18   [provider]  telmisartan (MICARDIS) 80 MG tablet Take 80 mg by mouth at bedtime.  06/24/18   [provider]  traMADol (ULTRAM) 50 MG tablet Take 50 mg by mouth in the morning, at noon, and at bedtime.  02/16/18   [provider]  venlafaxine XR (EFFEXOR-XR) 37.5 MG 24 hr capsule Take 37.5 mg by mouth at bedtime.  05/11/15   [provider]     ___________________________________________________________________________________________________ Physical Exam:    05/16/2022   10:45 PM 05/16/2022    9:30 PM 05/16/2022    9:15 PM  Vitals with BMI  Systolic Q000111Q  Q000111Q  Diastolic 73  92  Pulse 86 91 89     1. General:  in No  Acute distress   chronically ill   -appearing 2. Psychological: Alert and   Oriented 3. Head/ENT:    Dry Mucous Membranes                          Head Non traumatic, neck supple                       Poor Dentition 4. SKIN: decreased Skin turgor,  Skin clean Dry and intact no rash 5. Heart: Regular rate and rhythm no  Murmur, no Rub or gallop 6. Lungs:   no wheezes or crackles   7. Abdomen: Soft, RUQ nad epigastric tender, Non distended   8. Lower extremities: no clubbing, cyanosis, no  edema 9. Neurologically Grossly intact, moving all 4 extremities equally   10. MSK: Normal range of motion    Chart has been reviewed  ______________________________________________________________________________________________  Assessment/Plan 71 y.o. female with medical history significant of HTN, HLD, asthma, glaucoma, GERD,   Admitted for   Hypokalemia   AKI (acute kidney injury)   Increased anion gap metabolic acidosis    Acute pancreatitis without infection or necrosis, unspecified pancreatitis type  Dehydration    Present on Admission:  Dehydration  Chest pain  Pancreatitis  Hypertension  Bipolar disorder, in full remission, most recent episode mixed ([place])  Metabolic acidosis     Dehydration Rehydrate and follow fluid status  Chest pain Somewhat atypical stable troponins EKG nonischemic.  Obtain echogram in a.m.  Pancreatitis  CT of abdomen 1wk ago did not show pancreatitis but has persistent lipase elvation and epigstric pain    Will rehydrate with aggressive IV fluids Keep n.p.o. Follow clinically    -Check lipid panel - Discontinue offending medications ( hydrochlorothiazide,  furosemide ) Control pain with IV pain medications If no significant improvement in the next 24 hours may benefit from GI consult     Hypertension Allow permissive HTn for now  Bipolar disorder, in full remission, most recent episode mixed ([place]) Continue home meds  Metabolic acidosis lactica cid wnl, could be in in the setting of renal failure vs starvation acidosis    Other plan as per orders.  DVT prophylaxis:  SCD      Code Status:    Code Status: Prior FULL CODE  as per patient   I had personally discussed CODE STATUS with patient and     Family Communication:   Family not at  Bedside    Disposition Plan:         To home once workup is complete and patient is stable   Following barriers for discharge:                            Electrolytes corrected                                                         Will need consultants to evaluate patient prior to discharge                       Would benefit from PT/OT eval prior to DC  Ordered                     Consults called: [facility]  Will notify general surgery pt is here    Admission status:  ED Disposition     ED Disposition  [place]: [place] [[id number]]  Level of Care: Progressive [102]  Admit to Progressive based on following criteria: GI, ENDOCRINE disease patients with GI bleeding, acute liver failure or pancreatitis, stable with diabetic ketoacidosis or thyrotoxicosis (hypothyroid) state.  May place patient in observation at [facility] or [place] if equivalent level of care is available:: No  Covid Evaluation: Asymptomatic - no recent exposure (last 10 days) testing not required  Diagnosis: Dehydration [276.51.ICD-9-CM]  Admitting Physician: Toy Baker [[id number]]  Attending Physician: Toy Baker [[id number]]           Obs      Level of care         progressive tele indefinitely please discontinue once patient no longer  qualifies COVID-19 Labs      Shawnie Nicole 05/17/2022, 12:21 AM    [facility]     after 2 AM please page floor coverage PA If 7AM-7PM, please contact the day team taking care of the patient using [email or web address]   Patient was evaluated in the context of the global COVID-19 pandemic, which necessitated consideration that the patient might be at risk for infection with the SARS-CoV-2 virus that causes COVID-19. Institutional protocols and algorithms that pertain to the evaluation of patients at risk for  COVID-19 are in a state of rapid change based on information released by regulatory bodies including the CDC and federal and state organizations. These policies and algorithms were followed during the patient's care.

## 2022-05-16 NOTE — Subjective & Objective (Signed)
Brought in from home with nausea vomiting diarrhea for the past 2 weeks with intermittent chest pain patient has been told in the past has pancreatitis and also transaminases. Has been feeling unwell for at least 4 weeks and getting worse.  She was seen about a week ago if elevated lipase and low potassium how appointment with potassium was replaced she had a work-up done of CTA of her chest and CT of abdomen which was unremarkable she was able to be discharged to home but never got better. She has nausea and vomiting every time she eats.  Vomiting numerous times per day making small stool. No hematemesis no fever she went to her primary care today and was told to go to emergency department secondary to abnormal labs. Patient does not smoke or drink Patient does use BC powders daily

## 2022-05-16 NOTE — ED Provider Notes (Signed)
[facility] EMERGENCY DEPARTMENT Provider Note   CSN: [id number] Arrival date & time: 05/16/22  1426     History  Chief Complaint  Patient presents with   Vomiting   Abnormal Lab    Stephanie Benton is a 71 y.o. female.  Patient is a 71 year old female with history of hypertension, hyperlipidemia, status post appendectomy, abdominal hysterectomy and hernia repair who is presenting with persistent vomiting and abdominal pain.  Patient has been feeling unwell for the last 4 weeks and is gradually been getting worse.  She was seen approximately 7 days ago for similar symptoms and at that time was noted to have hypokalemia and elevated lipase.  At that time her potassium was replaced and she was given fluids and discharged home after negative CTA of her chest and negative CT of her abdomen.  Patient reports since going home she has not gotten any better.  She continues to have upper abdominal pain and vomiting anytime she tries to eat.  She has been able to keep down just a little bit of water.  She is vomiting numerous times a day and reports very little stool but when she does have a stool still liquid.  She has not vomited any blood or have blood in her stool that she is aware of.  She has had no cough or congestion.  She denies any fever.  She has never had anything like this prior.  She did see her doctor yesterday and they called her today telling her to come to the emergency room due to abnormal labs.  Patient denies any alcohol use, she does not use NSAIDs but does use BC powders daily.  The history is provided by the patient.  Abnormal Lab      Home Medications Prior to Admission medications   Medication Sig Start Date End Date Taking? Authorizing Provider  albuterol (PROVENTIL) (2.5 MG/3ML) 0.083% nebulizer solution Take 3 mLs (2.5 mg total) by nebulization every 6 (six) hours as needed for wheezing or shortness of breath. Patient not taking: Reported on 10/11/2019  11/27/14   Elson Areas, PA-C  albuterol (VENTOLIN HFA) 108 (90 Base) MCG/ACT inhaler Inhale 2 puffs into the lungs every 6 (six) hours as needed for wheezing or shortness of breath.    [provider]  bumetanide (BUMEX) 1 MG tablet Take 1 mg by mouth daily.  05/26/19   [provider]  busPIRone (BUSPAR) 7.5 MG tablet Take 7.5 mg by mouth 3 (three) times daily.  07/13/18   [provider]  cyanocobalamin (,VITAMIN B-12,) 1000 MCG/ML injection Inject 1,000 mcg into the muscle every 30 (thirty) days.  06/08/19   [provider]  DEXILANT 60 MG capsule Take 60 mg by mouth every evening.  05/05/13   [provider]  diclofenac sodium (VOLTAREN) 1 % GEL Apply 2-4 g topically 4 (four) times daily as needed (for pain).  12/04/17   [provider]  furosemide (LASIX) 20 MG tablet Take 0.5 tablets (10 mg total) by mouth daily for 4 days. 10/12/19 10/16/19  Long, Arlyss Repress, MD  ketoconazole (NIZORAL) 2 % cream Apply 1 fingertip amount to each foot daily. Patient not taking: Reported on 10/11/2019 04/23/18   Park Liter, DPM  latanoprost (XALATAN) 0.005 % ophthalmic solution Place 1 drop into both eyes at bedtime. 02/16/18   [provider]  LYRICA 100 MG capsule Take 100 mg by mouth 3 (three) times daily.  05/05/13   [provider]  nicotine (NICODERM CQ - DOSED IN MG/24 HOURS) 21 mg/24hr patch Place 1 patch (21 mg total) onto the skin daily. Patient not taking: Reported on 10/11/2019 02/19/18   Dhungel, Theda Belfast, MD  pantoprazole (PROTONIX) 20 MG tablet Take 1 tablet (20 mg total) by mouth daily. 05/09/22 06/08/22  Glyn Ade, MD  potassium chloride (KLOR-CON) 20 MEQ packet Take 20 mEq by mouth 2 (two) times daily for 5 days. 05/09/22 05/14/22  Glyn Ade, MD  risperiDONE (RISPERDAL) 1 MG tablet Take 1 mg by mouth at bedtime.  11/24/14   [provider]  sertraline (ZOLOFT) 100 MG tablet Take 100 mg by mouth at bedtime.   05/05/13   [provider]  simvastatin (ZOCOR) 40 MG tablet Take 40 mg by mouth at bedtime.  05/09/15   [provider]  SYMBICORT 160-4.5 MCG/ACT inhaler Inhale 1 puff into the lungs 2 (two) times daily as needed (for flares).  01/28/18   [provider]  telmisartan (MICARDIS) 80 MG tablet Take 80 mg by mouth at bedtime.  06/24/18   [provider]  traMADol (ULTRAM) 50 MG tablet Take 50 mg by mouth in the morning, at noon, and at bedtime.  02/16/18   [provider]  venlafaxine XR (EFFEXOR-XR) 37.5 MG 24 hr capsule Take 37.5 mg by mouth at bedtime.  05/11/15   [provider]      Allergies    Fish-derived products, Morphine and related, Belsomra [suvorexant], Codeine, Fentanyl, Ibuprofen, Nsaids, Other, Penicillins, Sulfa antibiotics, and Tolmetin    Review of Systems   Review of Systems  Physical Exam Updated Vital Signs BP (!) 134/92   Pulse 91   Temp 99 F (37.2 C) (Oral)   Resp 10   SpO2 96%  Physical Exam Vitals and nursing note reviewed.  Constitutional:      General: She is not in acute distress.    Appearance: She is well-developed.  HENT:     Head: Normocephalic and atraumatic.     Mouth/Throat:     Mouth: Mucous membranes are dry.  Eyes:     Pupils: Pupils are equal, round, and reactive to light.  Cardiovascular:     Rate and Rhythm: Normal rate and regular rhythm.     Heart sounds: Normal heart sounds. No murmur heard.    No friction rub.  Pulmonary:     Effort: Pulmonary effort is normal.     Breath sounds: Normal breath sounds. No wheezing or rales.  Abdominal:     General: Bowel sounds are normal. There is no distension.     Palpations: Abdomen is soft.     Tenderness: There is abdominal tenderness in the right upper quadrant, epigastric area and left upper quadrant. There is guarding. There is no rebound.  Musculoskeletal:        General: No tenderness. Normal range of motion.     Right lower leg: No  edema.     Left lower leg: No edema.     Comments: No edema  Skin:    General: Skin is warm and dry.     Findings: No rash.  Neurological:     Mental Status: She is alert and oriented to person, place, and time. Mental status is at baseline.     Cranial Nerves: No cranial nerve deficit.  Psychiatric:        Mood and Affect: Mood normal.        Behavior: Behavior normal.     ED Results /  Procedures / Treatments   Labs (all labs ordered are listed, but only abnormal results are displayed) Labs Reviewed  CBC WITH DIFFERENTIAL/PLATELET - Abnormal; Notable for the following components:      Result Value   RBC 5.44 (*)    MCV 76.8 (*)    All other components within normal limits  COMPREHENSIVE METABOLIC PANEL - Abnormal; Notable for the following components:   Potassium 3.0 (*)    Chloride 87 (*)    CO2 21 (*)    Glucose, Bld 107 (*)    Creatinine, Ser 2.05 (*)    Total Bilirubin 2.0 (*)    GFR, Estimated 25 (*)    Anion gap 27 (*)    All other components within normal limits  LIPASE, BLOOD - Abnormal; Notable for the following components:   Lipase 309 (*)    All other components within normal limits  TROPONIN I (HIGH SENSITIVITY) - Abnormal; Notable for the following components:   Troponin I (High Sensitivity) 31 (*)    All other components within normal limits  TROPONIN I (HIGH SENSITIVITY) - Abnormal; Notable for the following components:   Troponin I (High Sensitivity) 28 (*)    All other components within normal limits  URINALYSIS, ROUTINE W REFLEX MICROSCOPIC  LACTIC ACID, PLASMA  LACTIC ACID, PLASMA    EKG None  Radiology US Abdomen Limited RUQ (LIVER/GB)  Result Date: 05/16/2022 CLINICAL DATA:  Right upper quadrant pain, nausea/vomiting. EXAM: ULTRASOUND ABDOMEN LIMITED RIGHT UPPER QUADRANT COMPARISON:  05/09/2022. FINDINGS: Gallbladder: Stones and sludge are present within the gallbladder. No gallbladder wall thickening or pericholecystic fluid. A Murphy sign  is noted by sonographer. Common bile duct: Diameter: 3.6 mm. Liver: No focal lesion identified. Increased parenchymal echogenicity. Portal vein is patent on color Doppler imaging with normal direction of blood flow towards the liver. Other: No free fluid. IMPRESSION: 1. Cholelithiasis and sludge in the gallbladder with positive Murphy sign. No gallbladder wall thickening or pericholecystic edema. Clinical correlation is recommended to exclude acute cholecystitis. 2. Hepatic steatosis. Electronically Signed   By: Thornell Sartorius M.D.   On: 05/16/2022 22:12   DG Chest 1 View  Result Date: 05/16/2022 CLINICAL DATA:  Chest pain EXAM: CHEST  1 VIEW COMPARISON:  Chest 05/09/2022 FINDINGS: Heart size and vascularity normal. Lungs clear without infiltrate effusion. Moderate to large hiatal hernia. IMPRESSION: No active disease. Electronically Signed   By: Marlan Palau M.D.   On: 05/16/2022 16:21    Procedures Procedures    Medications Ordered in ED Medications  lactated ringers infusion (has no administration in time range)  potassium chloride 10 mEq in 100 mL IVPB (10 mEq Intravenous New Bag/Given 05/16/22 2237)  ondansetron (ZOFRAN-ODT) disintegrating tablet 4 mg (4 mg Oral Given 05/16/22 1537)  HYDROmorphone (DILAUDID) injection 0.5 mg (0.5 mg Intramuscular Given 05/16/22 1544)  lactated ringers bolus 1,000 mL (1,000 mLs Intravenous New Bag/Given 05/16/22 2236)  HYDROmorphone (DILAUDID) injection 0.5 mg (0.5 mg Intravenous Given 05/16/22 2235)  metoCLOPramide (REGLAN) injection 10 mg (10 mg Intravenous Given 05/16/22 2236)    ED Course/ Medical Decision Making/ A&P                           Medical Decision Making Amount and/or Complexity of Data Reviewed External Data Reviewed: notes.    Details: Prior ER and PCP Labs: ordered. Decision-making details documented in ED Course. Radiology: ordered and independent interpretation performed. Decision-making details documented in ED  Course.  Risk Prescription drug management. Decision regarding hospitalization.   Pt with multiple medical problems and comorbidities and presenting today with a complaint that caries a high risk for morbidity and mortality.  Here today with persistent abdominal pain nausea and vomiting.  Patient's symptoms have now been going on for 4 weeks and not improving.  No prior history of similar symptoms.  Patient's vital signs are stable today.  I independently interpreted patient's labs which show an elevated troponin of 28 which appears to be her baseline, CBC within normal limits, CMP with hypokalemia of 3.0, new AKI with creatinine of 2.05 from baseline of 1.5, elevated bilirubin of 2.0 today and an anion gap of 27 and elevated lipase at 300. I have independently visualized and interpreted pt's images today.  Chest x-ray without acute findings.  Given elevated lipase and total bilirubin we will do an ultrasound as patient's CT in the past showed cholelithiasis to make sure there is no evidence of bile duct dilatation or concern for obstruction.  Patient given potassium replacement, IV fluids and pain and nausea control.  Given patient's abnormal lab work feel that she requires admission for further hydration and treatment of above electrolyte abnormalities.  Findings discussed with the patient and her family.  They are comfortable with this plan.  10:38 PM Ultrasound showed positive Murphy sign and cholelithiasis but no gallbladder wall edema or dilated common bile duct.  Given patient's above finding will admit for further care.  May need GI consult while hospitalized.          Final Clinical Impression(s) / ED Diagnoses Final diagnoses:  Hypokalemia  AKI (acute kidney injury) ([place])  Increased anion gap metabolic acidosis  Acute pancreatitis without infection or necrosis, unspecified pancreatitis type  Dehydration    Rx / DC Orders ED Discharge Orders     None         Blanchie Dessert, MD 05/16/22 2238

## 2022-05-16 NOTE — Assessment & Plan Note (Signed)
Rehydrate and follow fluid status 

## 2022-05-16 NOTE — Assessment & Plan Note (Signed)
Somewhat atypical stable troponins EKG nonischemic. Obtain echogram in a.m.

## 2022-05-16 NOTE — ED Triage Notes (Signed)
Pt arrives via EMS from home for n/v/diarrhea for 2 weeks. Pt also has intermittent cp. Pt went to PCP and had labs. Pt was told her liver enzymes are elevated- needs to be treated for pancreatitis. EMS gave 4mg  zofran.  HR 100, BP 110/74, 98% on room air, CBG 110

## 2022-05-17 ENCOUNTER — Observation Stay (HOSPITAL_COMMUNITY): Payer: Medicare Other | Admitting: Anesthesiology

## 2022-05-17 ENCOUNTER — Encounter (HOSPITAL_COMMUNITY): Payer: Self-pay | Admitting: Internal Medicine

## 2022-05-17 ENCOUNTER — Encounter (HOSPITAL_COMMUNITY)
Admission: EM | Disposition: A | Discharge status: Home / Self Care | Payer: Self-pay | Source: Home / Self Care | Attending: Internal Medicine

## 2022-05-17 ENCOUNTER — Observation Stay (HOSPITAL_BASED_OUTPATIENT_CLINIC_OR_DEPARTMENT_OTHER): Payer: Medicare Other

## 2022-05-17 ENCOUNTER — Other Ambulatory Visit: Payer: Self-pay

## 2022-05-17 DIAGNOSIS — K449 Diaphragmatic hernia without obstruction or gangrene: Secondary | ICD-10-CM | POA: Diagnosis present

## 2022-05-17 DIAGNOSIS — R1013 Epigastric pain: Secondary | ICD-10-CM | POA: Diagnosis not present

## 2022-05-17 DIAGNOSIS — F17210 Nicotine dependence, cigarettes, uncomplicated: Secondary | ICD-10-CM

## 2022-05-17 DIAGNOSIS — K2091 Esophagitis, unspecified with bleeding: Secondary | ICD-10-CM | POA: Diagnosis not present

## 2022-05-17 DIAGNOSIS — K2101 Gastro-esophageal reflux disease with esophagitis, with bleeding: Secondary | ICD-10-CM | POA: Diagnosis present

## 2022-05-17 DIAGNOSIS — K760 Fatty (change of) liver, not elsewhere classified: Secondary | ICD-10-CM | POA: Diagnosis present

## 2022-05-17 DIAGNOSIS — K209 Esophagitis, unspecified without bleeding: Secondary | ICD-10-CM | POA: Diagnosis present

## 2022-05-17 DIAGNOSIS — K8010 Calculus of gallbladder with chronic cholecystitis without obstruction: Secondary | ICD-10-CM | POA: Diagnosis present

## 2022-05-17 DIAGNOSIS — Z20822 Contact with and (suspected) exposure to covid-19: Secondary | ICD-10-CM | POA: Diagnosis present

## 2022-05-17 DIAGNOSIS — I501 Left ventricular failure: Secondary | ICD-10-CM

## 2022-05-17 DIAGNOSIS — E872 Acidosis, unspecified: Secondary | ICD-10-CM | POA: Diagnosis present

## 2022-05-17 DIAGNOSIS — Z888 Allergy status to other drugs, medicaments and biological substances status: Secondary | ICD-10-CM | POA: Diagnosis not present

## 2022-05-17 DIAGNOSIS — I10 Essential (primary) hypertension: Secondary | ICD-10-CM

## 2022-05-17 DIAGNOSIS — Z8249 Family history of ischemic heart disease and other diseases of the circulatory system: Secondary | ICD-10-CM | POA: Diagnosis not present

## 2022-05-17 DIAGNOSIS — Z87891 Personal history of nicotine dependence: Secondary | ICD-10-CM | POA: Diagnosis not present

## 2022-05-17 DIAGNOSIS — E860 Dehydration: Secondary | ICD-10-CM | POA: Diagnosis present

## 2022-05-17 DIAGNOSIS — K859 Acute pancreatitis without necrosis or infection, unspecified: Secondary | ICD-10-CM | POA: Diagnosis present

## 2022-05-17 DIAGNOSIS — G8929 Other chronic pain: Secondary | ICD-10-CM | POA: Diagnosis present

## 2022-05-17 DIAGNOSIS — K2971 Gastritis, unspecified, with bleeding: Secondary | ICD-10-CM | POA: Diagnosis present

## 2022-05-17 DIAGNOSIS — K8020 Calculus of gallbladder without cholecystitis without obstruction: Secondary | ICD-10-CM | POA: Diagnosis not present

## 2022-05-17 DIAGNOSIS — J45909 Unspecified asthma, uncomplicated: Secondary | ICD-10-CM | POA: Diagnosis present

## 2022-05-17 DIAGNOSIS — J449 Chronic obstructive pulmonary disease, unspecified: Secondary | ICD-10-CM | POA: Diagnosis not present

## 2022-05-17 DIAGNOSIS — I129 Hypertensive chronic kidney disease with stage 1 through stage 4 chronic kidney disease, or unspecified chronic kidney disease: Secondary | ICD-10-CM | POA: Diagnosis present

## 2022-05-17 DIAGNOSIS — Z882 Allergy status to sulfonamides status: Secondary | ICD-10-CM | POA: Diagnosis not present

## 2022-05-17 DIAGNOSIS — E785 Hyperlipidemia, unspecified: Secondary | ICD-10-CM | POA: Diagnosis present

## 2022-05-17 DIAGNOSIS — N179 Acute kidney failure, unspecified: Secondary | ICD-10-CM | POA: Diagnosis present

## 2022-05-17 DIAGNOSIS — Z6837 Body mass index (BMI) 37.0-37.9, adult: Secondary | ICD-10-CM | POA: Diagnosis not present

## 2022-05-17 DIAGNOSIS — E669 Obesity, unspecified: Secondary | ICD-10-CM | POA: Diagnosis present

## 2022-05-17 DIAGNOSIS — K2970 Gastritis, unspecified, without bleeding: Secondary | ICD-10-CM | POA: Diagnosis not present

## 2022-05-17 DIAGNOSIS — F3178 Bipolar disorder, in full remission, most recent episode mixed: Secondary | ICD-10-CM | POA: Diagnosis present

## 2022-05-17 DIAGNOSIS — N1831 Chronic kidney disease, stage 3a: Secondary | ICD-10-CM | POA: Diagnosis present

## 2022-05-17 DIAGNOSIS — K3189 Other diseases of stomach and duodenum: Secondary | ICD-10-CM

## 2022-05-17 DIAGNOSIS — E876 Hypokalemia: Secondary | ICD-10-CM | POA: Diagnosis present

## 2022-05-17 DIAGNOSIS — R7989 Other specified abnormal findings of blood chemistry: Secondary | ICD-10-CM | POA: Diagnosis not present

## 2022-05-17 HISTORY — PX: BIOPSY: SHX5522

## 2022-05-17 HISTORY — PX: ESOPHAGOGASTRODUODENOSCOPY (EGD) WITH PROPOFOL: SHX5813

## 2022-05-17 LAB — COMPREHENSIVE METABOLIC PANEL WITH GFR
ALT: 17 U/L (ref 0–44)
AST: 27 U/L (ref 15–41)
Albumin: 3.2 g/dL — ABNORMAL LOW (ref 3.5–5.0)
Alkaline Phosphatase: 74 U/L (ref 38–126)
Anion gap: 18 — ABNORMAL HIGH (ref 5–15)
BUN: 22 mg/dL (ref 8–23)
CO2: 24 mmol/L (ref 22–32)
Calcium: 9 mg/dL (ref 8.9–10.3)
Chloride: 92 mmol/L — ABNORMAL LOW (ref 98–111)
Creatinine, Ser: 1.84 mg/dL — ABNORMAL HIGH (ref 0.44–1.00)
GFR, Estimated: 29 mL/min — ABNORMAL LOW (ref >60)
Glucose, Bld: 87 mg/dL (ref 70–99)
Potassium: 3 mmol/L — ABNORMAL LOW (ref 3.5–5.1)
Sodium: 134 mmol/L — ABNORMAL LOW (ref 135–145)
Total Bilirubin: 1.4 mg/dL — ABNORMAL HIGH (ref 0.3–1.2)
Total Protein: 6 g/dL — ABNORMAL LOW (ref 6.5–8.1)

## 2022-05-17 LAB — ECHOCARDIOGRAM COMPLETE
AR max vel: 2.79 cm2
AV Area VTI: 2.79 cm2
AV Area mean vel: 2.7 cm2
AV Mean grad: 7 mmHg
AV Peak grad: 13 mmHg
Ao pk vel: 1.8 m/s
Area-P 1/2: 4.17 cm2
S' Lateral: 2.3 cm

## 2022-05-17 LAB — CBC
HCT: 34.8 % — ABNORMAL LOW (ref 36.0–46.0)
Hemoglobin: 11.7 g/dL — ABNORMAL LOW (ref 12.0–15.0)
MCH: 26.2 pg (ref 26.0–34.0)
MCHC: 33.6 g/dL (ref 30.0–36.0)
MCV: 78 fL — ABNORMAL LOW (ref 80.0–100.0)
Platelets: 241 10*3/uL (ref 150–400)
RBC: 4.46 MIL/uL (ref 3.87–5.11)
RDW: 14.3 % (ref 11.5–15.5)
WBC: 8.2 10*3/uL (ref 4.0–10.5)
nRBC: 0 % (ref 0.0–0.2)

## 2022-05-17 LAB — BASIC METABOLIC PANEL WITH GFR
Anion gap: 20 — ABNORMAL HIGH (ref 5–15)
BUN: 23 mg/dL (ref 8–23)
CO2: 29 mmol/L (ref 22–32)
Calcium: 9.3 mg/dL (ref 8.9–10.3)
Chloride: 88 mmol/L — ABNORMAL LOW (ref 98–111)
Creatinine, Ser: 2.05 mg/dL — ABNORMAL HIGH (ref 0.44–1.00)
GFR, Estimated: 25 mL/min — ABNORMAL LOW (ref >60)
Glucose, Bld: 92 mg/dL (ref 70–99)
Potassium: 3.5 mmol/L (ref 3.5–5.1)
Sodium: 137 mmol/L (ref 135–145)

## 2022-05-17 LAB — URINALYSIS, COMPLETE (UACMP) WITH MICROSCOPIC
Glucose, UA: NEGATIVE mg/dL
Hgb urine dipstick: NEGATIVE
Ketones, ur: 5 mg/dL — AB
Leukocytes,Ua: NEGATIVE
Nitrite: NEGATIVE
Protein, ur: 30 mg/dL — AB
Specific Gravity, Urine: 1.018 (ref 1.005–1.030)
pH: 5 (ref 5.0–8.0)

## 2022-05-17 LAB — I-STAT VENOUS BLOOD GAS, ED
Acid-Base Excess: 5 mmol/L — ABNORMAL HIGH (ref 0.0–2.0)
Bicarbonate: 29.1 mmol/L — ABNORMAL HIGH (ref 20.0–28.0)
Calcium, Ion: 1.05 mmol/L — ABNORMAL LOW (ref 1.15–1.40)
HCT: 38 % (ref 36.0–46.0)
Hemoglobin: 12.9 g/dL (ref 12.0–15.0)
O2 Saturation: 47 %
Potassium: 3.5 mmol/L (ref 3.5–5.1)
Sodium: 132 mmol/L — ABNORMAL LOW (ref 135–145)
TCO2: 30 mmol/L (ref 22–32)
pCO2, Ven: 40.1 mmHg — ABNORMAL LOW (ref 44–60)
pH, Ven: 7.468 — ABNORMAL HIGH (ref 7.25–7.43)
pO2, Ven: 24 mmHg — CL (ref 32–45)

## 2022-05-17 LAB — PROTIME-INR
INR: 1.1 (ref 0.8–1.2)
Prothrombin Time: 14.2 seconds (ref 11.4–15.2)

## 2022-05-17 LAB — MAGNESIUM
Magnesium: 1.4 mg/dL — ABNORMAL LOW (ref 1.7–2.4)
Magnesium: 1.5 mg/dL — ABNORMAL LOW (ref 1.7–2.4)

## 2022-05-17 LAB — SARS CORONAVIRUS 2 BY RT PCR: SARS Coronavirus 2 by RT PCR: NEGATIVE

## 2022-05-17 LAB — LIPASE, BLOOD: Lipase: 210 U/L — ABNORMAL HIGH (ref 11–51)

## 2022-05-17 LAB — OSMOLALITY, URINE: Osmolality, Ur: 449 mOsm/kg (ref 300–900)

## 2022-05-17 LAB — PREALBUMIN: Prealbumin: 13 mg/dL — ABNORMAL LOW (ref 18–38)

## 2022-05-17 LAB — LIPID PANEL
Cholesterol: 177 mg/dL (ref 0–200)
HDL: 42 mg/dL (ref >40)
LDL Cholesterol: 117 mg/dL — ABNORMAL HIGH (ref 0–99)
Total CHOL/HDL Ratio: 4.2 RATIO
Triglycerides: 92 mg/dL (ref <150)
VLDL: 18 mg/dL (ref 0–40)

## 2022-05-17 LAB — BETA-HYDROXYBUTYRIC ACID: Beta-Hydroxybutyric Acid: 4.06 mmol/L — ABNORMAL HIGH (ref 0.05–0.27)

## 2022-05-17 LAB — LACTIC ACID, PLASMA: Lactic Acid, Venous: 1.4 mmol/L (ref 0.5–1.9)

## 2022-05-17 LAB — OSMOLALITY: Osmolality: 287 mOsm/kg (ref 275–295)

## 2022-05-17 LAB — SALICYLATE LEVEL: Salicylate Lvl: <7 mg/dL — ABNORMAL LOW (ref 7.0–30.0)

## 2022-05-17 LAB — TSH: TSH: 3.663 u[IU]/mL (ref 0.350–4.500)

## 2022-05-17 LAB — PHOSPHORUS
Phosphorus: 3.7 mg/dL (ref 2.5–4.6)
Phosphorus: 3.2 mg/dL (ref 2.5–4.6)

## 2022-05-17 LAB — CK: Total CK: 138 U/L (ref 38–234)

## 2022-05-17 LAB — CREATININE, URINE, RANDOM: Creatinine, Urine: 364 mg/dL

## 2022-05-17 LAB — SODIUM, URINE, RANDOM: Sodium, Ur: <10 mmol/L

## 2022-05-17 SURGERY — ESOPHAGOGASTRODUODENOSCOPY (EGD) WITH PROPOFOL
Anesthesia: Monitor Anesthesia Care

## 2022-05-17 MED ORDER — HYDROCODONE-ACETAMINOPHEN 5-325 MG PO TABS
1.0000 | ORAL_TABLET | ORAL | Status: DC | PRN
  Administered 2022-05-17: 2 via ORAL
  Administered 2022-05-18: 1 via ORAL
  Filled 2022-05-17: qty 1
  Filled 2022-05-17: qty 2

## 2022-05-17 MED ORDER — POTASSIUM CHLORIDE 10 MEQ/100ML IV SOLN
10.0000 meq | INTRAVENOUS | Status: AC
  Administered 2022-05-17 (×3): 10 meq via INTRAVENOUS
  Filled 2022-05-17 (×3): qty 100

## 2022-05-17 MED ORDER — VENLAFAXINE HCL ER 37.5 MG PO CP24
37.5000 mg | ORAL_CAPSULE | Freq: Every day | ORAL | Status: DC
  Administered 2022-05-17 – 2022-05-22 (×7): 37.5 mg via ORAL
  Filled 2022-05-17 (×9): qty 1

## 2022-05-17 MED ORDER — LACTATED RINGERS IV BOLUS: 1000.0000 mL | Freq: Once | INTRAVENOUS | Status: DC

## 2022-05-17 MED ORDER — ALBUTEROL SULFATE (2.5 MG/3ML) 0.083% IN NEBU: 2.5000 mg | INHALATION_SOLUTION | Freq: Four times a day (QID) | RESPIRATORY_TRACT | Status: DC | PRN

## 2022-05-17 MED ORDER — SODIUM CHLORIDE 0.9 % IV SOLN: INTRAVENOUS | Status: DC

## 2022-05-17 MED ORDER — ACETAMINOPHEN 325 MG PO TABS: 650.0000 mg | ORAL_TABLET | Freq: Four times a day (QID) | ORAL | Status: DC | PRN

## 2022-05-17 MED ORDER — ACETAMINOPHEN 650 MG RE SUPP: 650.0000 mg | Freq: Four times a day (QID) | RECTAL | Status: DC | PRN

## 2022-05-17 MED ORDER — HYDROMORPHONE HCL 1 MG/ML IJ SOLN
0.5000 mg | INTRAMUSCULAR | Status: DC | PRN
  Administered 2022-05-17 – 2022-05-18 (×3): 0.5 mg via INTRAVENOUS
  Filled 2022-05-17 (×3): qty 1

## 2022-05-17 MED ORDER — PROPOFOL 500 MG/50ML IV EMUL
INTRAVENOUS | Status: DC | PRN
  Administered 2022-05-17: 125 ug/kg/min via INTRAVENOUS

## 2022-05-17 MED ORDER — PANTOPRAZOLE SODIUM 40 MG IV SOLR
40.0000 mg | Freq: Two times a day (BID) | INTRAVENOUS | Status: DC
  Administered 2022-05-17 – 2022-05-20 (×8): 40 mg via INTRAVENOUS
  Filled 2022-05-17 (×8): qty 10

## 2022-05-17 MED ORDER — PROPOFOL 10 MG/ML IV BOLUS
INTRAVENOUS | Status: DC | PRN
  Administered 2022-05-17: 70 mg via INTRAVENOUS

## 2022-05-17 MED ORDER — RISPERIDONE 1 MG PO TABS
1.0000 mg | ORAL_TABLET | Freq: Every day | ORAL | Status: DC
  Administered 2022-05-17 – 2022-05-22 (×7): 1 mg via ORAL
  Filled 2022-05-17 (×10): qty 1

## 2022-05-17 MED ORDER — LIDOCAINE 2% (20 MG/ML) 5 ML SYRINGE
INTRAMUSCULAR | Status: DC | PRN
  Administered 2022-05-17 (×2): 50 mg via INTRAVENOUS

## 2022-05-17 MED ORDER — LATANOPROST 0.005 % OP SOLN
1.0000 [drp] | Freq: Every day | OPHTHALMIC | Status: DC
  Administered 2022-05-17 – 2022-05-22 (×6): 1 [drp] via OPHTHALMIC
  Filled 2022-05-17: qty 2.5

## 2022-05-17 MED ORDER — SUCRALFATE 1 GM/10ML PO SUSP
1.0000 g | Freq: Three times a day (TID) | ORAL | Status: DC
  Administered 2022-05-17 – 2022-05-18 (×3): 1 g via ORAL
  Filled 2022-05-17 (×3): qty 10

## 2022-05-17 MED ORDER — HYDROMORPHONE HCL 1 MG/ML IJ SOLN: 0.5000 mg | INTRAMUSCULAR | Status: DC | PRN

## 2022-05-17 MED ORDER — MAGNESIUM SULFATE 2 GM/50ML IV SOLN
2.0000 g | Freq: Once | INTRAVENOUS | Status: AC
  Administered 2022-05-17: 2 g via INTRAVENOUS
  Filled 2022-05-17: qty 50

## 2022-05-17 SURGICAL SUPPLY — 15 items

## 2022-05-17 NOTE — Assessment & Plan Note (Signed)
CT of abdomen 1wk ago did not show pancreatitis but has persistent lipase elvation and epigstric pain    Will rehydrate with aggressive IV fluids Keep n.p.o. Follow clinically    -Check lipid panel - Discontinue offending medications ( hydrochlorothiazide, furosemide ) Control pain with IV pain medications If no significant improvement in the next 24 hours may benefit from GI consult

## 2022-05-17 NOTE — ED Notes (Signed)
Echo at bedside

## 2022-05-17 NOTE — Anesthesia Postprocedure Evaluation (Signed)
Anesthesia Post Note  Patient: TRAMYA SCHOENFELDER  Procedure(s) Performed: ESOPHAGOGASTRODUODENOSCOPY (EGD) WITH PROPOFOL BIOPSY     Patient location during evaluation: Endoscopy Anesthesia Type: MAC Level of consciousness: awake and alert Pain management: pain level controlled Vital Signs Assessment: post-procedure vital signs reviewed and stable Respiratory status: spontaneous breathing, nonlabored ventilation, respiratory function stable and patient connected to nasal cannula oxygen Cardiovascular status: blood pressure returned to baseline and stable Postop Assessment: no apparent nausea or vomiting Anesthetic complications: no   No notable events documented.  Last Vitals:  Vitals:   05/17/22 1620 05/17/22 1656  BP: (!) 124/54 132/79  Pulse: 82 90  Resp: 17 15  Temp:  37 C  SpO2: 97% 98%    Last Pain:  Vitals:   05/17/22 1715  TempSrc:   PainSc: 6                  Avri Paiva DANIEL

## 2022-05-17 NOTE — Consult Note (Addendum)
Stephanie Benton April 10, 1951  [id number].    Requesting MD: Roel Cluck, MD  Chief Complaint/Reason for Consult: cholelithiasis, nausea, vomiting  HPI:  71 y/o F who presents with PO intolerance. States 3 weeks ago she developed nausea vomiting and since that time she has thrown up everything she eats, even small amounts of liquids. She denies travel, new foods. She denies significant abdominal pain. She denies post-prandial pain. She tells me when she eats greasy food, like a hamburger, it does give her diarrhea but does not cause RUQ pain, nausea, or vomiting. She denies constipation or diarrhea - reports 2-4 brown, semi-solid stools daily. She does report that her doctor started her on torsemide about 1 week before her symptoms started. She tells me that she has had gastric ulcers years ago, diagnosed on endoscopy, and her nausea/vomiting today is different than the pain she had with ulcers. She tells me that, up until her symptoms started 3 weeks ago, she was taking BC powder twice per day.  She denies fever, chills, chest pain, SOB, urinary sxs   Substance: 1 ppd smoker until onset of sxs. Surgical Hx: appendectomy, hysterectomy, hernia repair with mesh Social hx: Lives alone. Son in town. Denies use of blood thinning medications  ROS: As above Review of Systems  All other systems reviewed and are negative.   Family History  Problem Relation Age of Onset   Heart failure Mother    Cancer Father    Cancer Sister     Past Medical History:  Diagnosis Date   Hyperlipidemia    Hypertension     Past Surgical History:  Procedure Laterality Date   ABDOMINAL HYSTERECTOMY     APPENDECTOMY     HERNIA REPAIR     TONSILLECTOMY     TUBAL LIGATION      Social History:  reports that she has been smoking cigarettes. She has been smoking an average of .75 packs per day. She has never used smokeless tobacco. She reports that she does not drink alcohol and does not use drugs.  Allergies:   Allergies  Allergen Reactions   Fish-Derived Products Anaphylaxis   Morphine And Related Anaphylaxis and Swelling   Belsomra [Suvorexant] Other (See Comments)    Caused nightmares   Codeine Nausea And Vomiting   Fentanyl Hives   Ibuprofen Nausea Only and Other (See Comments)    Severe nausea- history of stomach ulcers   Nsaids Other (See Comments)    History of stomach ulcers   Other Other (See Comments)    History of stomach ulcers   Penicillins Hives    Did it involve swelling of the face/tongue/throat, SOB, or low BP? Yes Did it involve sudden or severe rash/hives, skin peeling, or any reaction on the inside of your mouth or nose? Yes Did you need to seek medical attention at a hospital or doctor's office? Yes When did it last happen? More than 10 years ago If all above answers are "NO", may proceed with cephalosporin use.    Sulfa Antibiotics Swelling    Site of swelling not recalled   Tolmetin Other (See Comments)    History of stomach ulcers    (Not in a hospital admission)    Physical Exam: Blood pressure 124/70, pulse 100, temperature 98.4 F (36.9 C), temperature source Oral, resp. rate 20, SpO2 98 %. General: Pleasant female laying on hospital bed, appears stated age, NAD. HEENT: head -normocephalic, atraumatic; Eyes: PERRLA, anicteric sclerae  Neck- Trachea is midline, no  thyromegaly or JVD appreciated.  CV- RRR, mild lower extremity edema  Pulm- breathing is non-labored. CTABL, no wheezes, rhales, rhonchi. Abd- soft, nontender, nondistended, negative murphy's sign, there is a palpable mass in the epigastric region, not reducible, not tender. Patient tells me it is her hernia that has been there for years.  GU- deferred  MSK- UE/LE symmetrical Neuro- nonfocal exam, no paresthesias. Psych- Alert and Oriented x3 with appropriate affect Skin: warm and dry, no rashes or lesions   Results for orders placed or performed during the hospital encounter of 05/16/22  (from the past 48 hour(s))  CBC with Differential     Status: Abnormal   Collection Time: 05/16/22  3:16 PM  Result Value Ref Range   WBC 7.1 4.0 - 10.5 K/uL   RBC 5.44 (H) 3.87 - 5.11 MIL/uL   Hemoglobin 14.4 12.0 - 15.0 g/dL   HCT 41.8 36.0 - 46.0 %   MCV 76.8 (L) 80.0 - 100.0 fL   MCH 26.5 26.0 - 34.0 pg   MCHC 34.4 30.0 - 36.0 g/dL   RDW 14.1 11.5 - 15.5 %   Platelets 299 150 - 400 K/uL   nRBC 0.0 0.0 - 0.2 %   Neutrophils Relative % 74 %   Neutro Abs 5.2 1.7 - 7.7 K/uL   Lymphocytes Relative 15 %   Lymphs Abs 1.1 0.7 - 4.0 K/uL   Monocytes Relative 10 %   Monocytes Absolute 0.7 0.1 - 1.0 K/uL   Eosinophils Relative 0 %   Eosinophils Absolute 0.0 0.0 - 0.5 K/uL   Basophils Relative 1 %   Basophils Absolute 0.0 0.0 - 0.1 K/uL   Immature Granulocytes 0 %   Abs Immature Granulocytes 0.03 0.00 - 0.07 K/uL    Comment: Performed at [facility], [address] [place], [place] [address]  Comprehensive metabolic panel     Status: Abnormal   Collection Time: 05/16/22  3:16 PM  Result Value Ref Range   Sodium 135 135 - 145 mmol/L   Potassium 3.0 (L) 3.5 - 5.1 mmol/L   Chloride 87 (L) 98 - 111 mmol/L   CO2 21 (L) 22 - 32 mmol/L   Glucose, Bld 107 (H) 70 - 99 mg/dL    Comment: Glucose reference range applies only to samples taken after fasting for at least 8 hours.   BUN 21 8 - 23 mg/dL   Creatinine, Ser 2.05 (H) 0.44 - 1.00 mg/dL   Calcium 10.1 8.9 - 10.3 mg/dL   Total Protein 7.2 6.5 - 8.1 g/dL   Albumin 3.9 3.5 - 5.0 g/dL   AST 39 15 - 41 U/L   ALT 25 0 - 44 U/L   Alkaline Phosphatase 94 38 - 126 U/L   Total Bilirubin 2.0 (H) 0.3 - 1.2 mg/dL   GFR, Estimated 25 (L) >60 mL/min    Comment: (NOTE) Calculated using the CKD-EPI Creatinine Equation (2021)    Anion gap 27 (H) 5 - 15    Comment: Electrolytes repeated to confirm. Performed at [facility], [place] [address], [place], [place] [address]   Lipase, blood     Status: Abnormal   Collection Time: 05/16/22   3:16 PM  Result Value Ref Range   Lipase 309 (H) 11 - 51 U/L    Comment: Performed at [facility], [place] [address], [place], [place] [address]  Troponin I (High Sensitivity)     Status: Abnormal   Collection Time: 05/16/22  3:16  PM  Result Value Ref Range   Troponin I (High Sensitivity) 31 (H) <18 ng/L    Comment: (NOTE) Elevated high sensitivity troponin I (hsTnI) values and significant  changes across serial measurements may suggest ACS but many other  chronic and acute conditions are known to elevate hsTnI results.  Refer to the "Links" section for chest pain algorithms and additional  guidance. Performed at [facility], [place] [address] [place], [place] [address]   Troponin I (High Sensitivity)     Status: Abnormal   Collection Time: 05/16/22  6:00 PM  Result Value Ref Range   Troponin I (High Sensitivity) 28 (H) <18 ng/L    Comment: (NOTE) Elevated high sensitivity troponin I (hsTnI) values and significant  changes across serial measurements may suggest ACS but many other  chronic and acute conditions are known to elevate hsTnI results.  Refer to the "Links" section for chest pain algorithms and additional  guidance. Performed at [facility], [place] [address] [place], [place] [address]   Lactic acid, plasma     Status: None   Collection Time: 05/16/22 10:30 PM  Result Value Ref Range   Lactic Acid, Venous 1.5 0.5 - 1.9 mmol/L    Comment: Performed at [facility] [address], [place], [place] [address]  Lactic acid, plasma     Status: None   Collection Time: 05/17/22 12:39 AM  Result Value Ref Range   Lactic Acid, Venous 1.4 0.5 - 1.9 mmol/L    Comment: Performed at [facility] [address], [place], [place] [address]  CK     Status: None   Collection Time: 05/17/22 12:39 AM  Result Value Ref Range   Total CK 138 38 - 234 U/L    Comment: Performed at [facility], [place] [address], [place], [place] [address]  Magnesium      Status: Abnormal   Collection Time: 05/17/22 12:39 AM  Result Value Ref Range   Magnesium 1.4 (L) 1.7 - 2.4 mg/dL    Comment: Performed at [facility] [address] [place], [place] [address]  Phosphorus     Status: None   Collection Time: 05/17/22 12:39 AM  Result Value Ref Range   Phosphorus 3.7 2.5 - 4.6 mg/dL    Comment: Performed at [facility] [address], [place], [place] [address]  TSH     Status: None   Collection Time: 05/17/22 12:39 AM  Result Value Ref Range   TSH 3.663 0.350 - 4.500 uIU/mL    Comment: Performed by a 3rd Generation assay with a functional sensitivity of <=0.01 uIU/mL. Performed at [facility], [place] [address] [place], [place] [address]   Protime-INR     Status: None   Collection Time: 05/17/22 12:39 AM  Result Value Ref Range   Prothrombin Time 14.2 11.4 - 15.2 seconds   INR 1.1 0.8 - 1.2    Comment: (NOTE) INR goal varies based on device and disease states. Performed at [facility], [place] [address] [place], [place] [address]   Salicylate level     Status: Abnormal   Collection Time: 05/17/22 12:39 AM  Result Value Ref Range   Salicylate Lvl <7.6 (L) 7.0 - 30.0 mg/dL    Comment: Performed at [facility] [address], [place],  [address]  Beta-hydroxybutyric acid     Status: Abnormal   Collection Time: 05/17/22 12:39  AM  Result Value Ref Range   Beta-Hydroxybutyric Acid 4.06 (H) 0.05 - 0.27 mmol/L    Comment: Performed at [facility] [address], [place], [place] [id number]  Basic metabolic panel     Status: Abnormal   Collection Time: 05/17/22 12:39 AM  Result Value Ref Range   Sodium 137 135 - 145 mmol/L   Potassium 3.5 3.5 - 5.1 mmol/L   Chloride 88 (L) 98 - 111 mmol/L   CO2 29 22 - 32 mmol/L   Glucose, Bld 92 70 - 99 mg/dL    Comment: Glucose reference range applies only to samples taken after fasting for at least 8 hours.   BUN 23 8 - 23 mg/dL   Creatinine, Ser 2.05 (H) 0.44 -  1.00 mg/dL   Calcium 9.3 8.9 - 10.3 mg/dL   GFR, Estimated 25 (L) >60 mL/min    Comment: (NOTE) Calculated using the CKD-EPI Creatinine Equation (2021)    Anion gap 20 (H) 5 - 15    Comment: Performed at [facility] [address] [place], [place] [address]  I-Stat venous blood gas, ED     Status: Abnormal   Collection Time: 05/17/22 12:43 AM  Result Value Ref Range   pH, Ven 7.468 (H) 7.25 - 7.43   pCO2, Ven 40.1 (L) 44 - 60 mmHg   pO2, Ven 24 (LL) 32 - 45 mmHg   Bicarbonate 29.1 (H) 20.0 - 28.0 mmol/L   TCO2 30 22 - 32 mmol/L   O2 Saturation 47 %   Acid-Base Excess 5.0 (H) 0.0 - 2.0 mmol/L   Sodium 132 (L) 135 - 145 mmol/L   Potassium 3.5 3.5 - 5.1 mmol/L   Calcium, Ion 1.05 (L) 1.15 - 1.40 mmol/L   HCT 38.0 36.0 - 46.0 %   Hemoglobin 12.9 12.0 - 15.0 g/dL   Sample type VENOUS    Comment NOTIFIED PHYSICIAN   Urinalysis, Complete w Microscopic Urine, Clean Catch     Status: Abnormal   Collection Time: 05/17/22  2:08 AM  Result Value Ref Range   Color, Urine AMBER (A) YELLOW    Comment: BIOCHEMICALS MAY BE AFFECTED BY COLOR   APPearance CLOUDY (A) CLEAR   Specific Gravity, Urine 1.018 1.005 - 1.030   pH 5.0 5.0 - 8.0   Glucose, UA NEGATIVE NEGATIVE mg/dL   Hgb urine dipstick NEGATIVE NEGATIVE   Bilirubin Urine SMALL (A) NEGATIVE   Ketones, ur 5 (A) NEGATIVE mg/dL   Protein, ur 30 (A) NEGATIVE mg/dL   Nitrite NEGATIVE NEGATIVE   Leukocytes,Ua NEGATIVE NEGATIVE   RBC / HPF 0-5 0 - 5 RBC/hpf   WBC, UA 6-10 0 - 5 WBC/hpf   Bacteria, UA FEW (A) NONE SEEN   Squamous Epithelial / LPF 11-20 0 - 5   Mucus PRESENT    Hyaline Casts, UA PRESENT     Comment: Performed at [facility], [place] [address] [place], [place] [address]  Creatinine, urine, random     Status: None   Collection Time: 05/17/22  2:08 AM  Result Value Ref Range   Creatinine, Urine 364 mg/dL    Comment: Performed at [facility] [address] [place], [place] [address]  Sodium, urine,  random     Status: None   Collection Time: 05/17/22  2:08 AM  Result Value Ref Range   Sodium, Ur <10 mmol/L    Comment: Performed at [facility] [address]  [place], [place] [address]  Prealbumin     Status: Abnormal   Collection Time: 05/17/22  4:47 AM  Result Value Ref Range   Prealbumin 13 (L) 18 - 38 mg/dL    Comment: Performed at [facility] [address], [place], [place] [address]  Lipase, blood     Status: Abnormal   Collection Time: 05/17/22  4:47 AM  Result Value Ref Range   Lipase 210 (H) 11 - 51 U/L    Comment: Performed at [facility], [place] [address], [place], [place] [address]  Comprehensive metabolic panel     Status: Abnormal   Collection Time: 05/17/22  4:47 AM  Result Value Ref Range   Sodium 134 (L) 135 - 145 mmol/L   Potassium 3.0 (L) 3.5 - 5.1 mmol/L   Chloride 92 (L) 98 - 111 mmol/L   CO2 24 22 - 32 mmol/L   Glucose, Bld 87 70 - 99 mg/dL    Comment: Glucose reference range applies only to samples taken after fasting for at least 8 hours.   BUN 22 8 - 23 mg/dL   Creatinine, Ser 1.84 (H) 0.44 - 1.00 mg/dL   Calcium 9.0 8.9 - 10.3 mg/dL   Total Protein 6.0 (L) 6.5 - 8.1 g/dL   Albumin 3.2 (L) 3.5 - 5.0 g/dL   AST 27 15 - 41 U/L   ALT 17 0 - 44 U/L   Alkaline Phosphatase 74 38 - 126 U/L   Total Bilirubin 1.4 (H) 0.3 - 1.2 mg/dL   GFR, Estimated 29 (L) >60 mL/min    Comment: (NOTE) Calculated using the CKD-EPI Creatinine Equation (2021)    Anion gap 18 (H) 5 - 15    Comment: Performed at [facility], [place] [address] [place], [place] [address]  CBC     Status: Abnormal   Collection Time: 05/17/22  4:47 AM  Result Value Ref Range   WBC 8.2 4.0 - 10.5 K/uL   RBC 4.46 3.87 - 5.11 MIL/uL   Hemoglobin 11.7 (L) 12.0 - 15.0 g/dL   HCT 34.8 (L) 36.0 - 46.0 %   MCV 78.0 (L) 80.0 - 100.0 fL   MCH 26.2 26.0 - 34.0 pg   MCHC 33.6 30.0 - 36.0 g/dL   RDW 14.3 11.5 - 15.5 %   Platelets 241 150 - 400 K/uL   nRBC 0.0 0.0 - 0.2 %     Comment: Performed at [facility], [place] [address] [place], [place] [address]  Magnesium     Status: Abnormal   Collection Time: 05/17/22  4:47 AM  Result Value Ref Range   Magnesium 1.5 (L) 1.7 - 2.4 mg/dL    Comment: Performed at [facility] [address] [place], [place] [address]  Phosphorus     Status: None   Collection Time: 05/17/22  4:47 AM  Result Value Ref Range   Phosphorus 3.2 2.5 - 4.6 mg/dL    Comment: Performed at [facility] [address], [place],  [address]  Lipid panel     Status: Abnormal   Collection Time: 05/17/22  4:47 AM  Result Value Ref Range   Cholesterol 177 0 - 200 mg/dL   Triglycerides 92 <150 mg/dL   HDL 42 >40 mg/dL   Total CHOL/HDL Ratio 4.2 RATIO   VLDL 18 0 - 40 mg/dL   LDL Cholesterol 117 (H) 0 - 99 mg/dL    Comment:  Total Cholesterol/HDL:CHD Risk Coronary Heart Disease Risk Table                     Men   Women  1/2 Average Risk   3.4   3.3  Average Risk       5.0   4.4  2 X Average Risk   9.6   7.1  3 X Average Risk  23.4   11.0        Use the calculated Patient Ratio above and the CHD Risk Table to determine the patient's CHD Risk.        ATP III CLASSIFICATION (LDL):  <100     mg/dL   Optimal  100-129  mg/dL   Near or Above                    Optimal  130-159  mg/dL   Borderline  160-189  mg/dL   High  >190     mg/dL   Very High Performed at [facility] [address], [place], [place] [address]   SARS Coronavirus 2 by RT PCR (hospital order, performed in [facility] hospital lab) *cepheid single result test* Anterior Nasal Swab     Status: None   Collection Time: 05/17/22  5:00 AM   Specimen: Anterior Nasal Swab  Result Value Ref Range   SARS Coronavirus 2 by RT PCR NEGATIVE NEGATIVE    Comment: (NOTE) SARS-CoV-2 target nucleic acids are NOT DETECTED.  The SARS-CoV-2 RNA is generally detectable in upper and lower respiratory specimens during the acute phase of infection. The  lowest concentration of SARS-CoV-2 viral copies this assay can detect is 250 copies / mL. A negative result does not preclude SARS-CoV-2 infection and should not be used as the sole basis for treatment or other patient management decisions.  A negative result may occur with improper specimen collection / handling, submission of specimen other than nasopharyngeal swab, presence of viral mutation(s) within the areas targeted by this assay, and inadequate number of viral copies (<250 copies / mL). A negative result must be combined with clinical observations, patient history, and epidemiological information.  Fact Sheet for Patients:   https://www.patel.info/  Fact Sheet for Healthcare Providers: https://hall.com/  This test is not yet approved or  cleared by the [place] FDA and has been authorized for detection and/or diagnosis of SARS-CoV-2 by FDA under an Emergency Use Authorization (EUA).  This EUA will remain in effect (meaning this test can be used) for the duration of the COVID-19 declaration under Section 564(b)(1) of the Act, 21 U.S.C. section 360bbb-3(b)(1), unless the authorization is terminated or revoked sooner.  Performed at [facility], [place] [address], [place], [place] [address]    US Abdomen Limited RUQ (LIVER/GB)  Result Date: 05/16/2022 CLINICAL DATA:  Right upper quadrant pain, nausea/vomiting. EXAM: ULTRASOUND ABDOMEN LIMITED RIGHT UPPER QUADRANT COMPARISON:  05/09/2022. FINDINGS: Gallbladder: Stones and sludge are present within the gallbladder. No gallbladder wall thickening or pericholecystic fluid. A Murphy sign is noted by sonographer. Common bile duct: Diameter: 3.6 mm. Liver: No focal lesion identified. Increased parenchymal echogenicity. Portal vein is patent on color Doppler imaging with normal direction of blood flow towards the liver. Other: No free fluid. IMPRESSION: 1. Cholelithiasis and sludge in the  gallbladder with positive Murphy sign. No gallbladder wall thickening or pericholecystic edema. Clinical correlation is recommended to exclude acute cholecystitis. 2. Hepatic steatosis. Electronically Signed   By: Brett Fairy  M.D.   On: 05/16/2022 22:12   DG Chest 1 View  Result Date: 05/16/2022 CLINICAL DATA:  Chest pain EXAM: CHEST  1 VIEW COMPARISON:  Chest 05/09/2022 FINDINGS: Heart size and vascularity normal. Lungs clear without infiltrate effusion. Moderate to large hiatal hernia. IMPRESSION: No active disease. Electronically Signed   By: Franchot Gallo M.D.   On: 05/16/2022 16:21      Assessment/Plan 71 y/o F who presents with 3 weeks of poor PO intake, including intolerance of liquids, and vomiting.  DDx: mild pancreatitis, non-obstructing choledocholithiasis, sxs cholelithiasis, PUD -  afebrile ,VSS, WBC WNL -  Lipase 210, bilirubin 1.4 (lipase 386 on 10/5, bili 1.9 on 10.5). Total bili was normal in 2022.  -  cholelithiasis without signs of cholecystitis were noted on RUQ U/S and  CT A/P.  - Agree with GI consult. Her sxs could be attributable to sxs gallstones, choledocholithiasis, gallstone pancreatitis. But she also could have gastritis/PUD from daily NSAID use. I do PUD is less likely, as she has no epigastric pain, no melena, and reports that her sxs feel different than when she had peptic ulcers in the past. I am not sure how frequently torsemide causes pancreatitis but it is worth considering given she started this medication a week before onset of sxs. Her symptoms may all be attributable to sxs cholelithiasis but I recommend having GI evaluate her and weigh-in on her symptoms/possible need for EGD before considering lap chole.   FEN - NPO, IVF until seen by GI, would be ok with CLD today if no other procedures planned by GI VTE - SCD's ID - No abx indicated from [place] - TRH service  Mild troponin elevation; 31- no acute infarct on EKG, getting echo now;  recently prescribed lasix Hiatal hernia Chronic abd wall fluid collection at old hernia site?  Asthma  Bipolar disorder PMH PUD - stop NSAIDs, PPI  I reviewed nursing notes, Consultant GI notes, hospitalist notes, last 24 h vitals and pain scores, last 48 h intake and output, last 24 h labs and trends, and last 24 h imaging results.  Jill Alexanders, [facility] 05/17/2022, 8:36 AM Please see Amion for pager number during day hours 7:00am-4:30pm or 7:00am -11:30am on weekends

## 2022-05-17 NOTE — ED Notes (Signed)
Pt transported to endoscopy 

## 2022-05-17 NOTE — Anesthesia Preprocedure Evaluation (Addendum)
Anesthesia Evaluation  Patient identified by MRN, date of birth, ID band Patient awake    Reviewed: Allergy & Precautions, NPO status , Patient's Chart, lab work & pertinent test results  History of Anesthesia Complications Negative for: history of anesthetic complications  Airway Mallampati: I  TM Distance: >3 FB Neck ROM: Full    Dental  (+) Edentulous Upper, Edentulous Lower, Dental Advisory Given   Pulmonary COPD, Current Smoker, former smoker,    Pulmonary exam normal        Cardiovascular hypertension, Normal cardiovascular exam  Echo 05/17/2022 IMPRESSIONS   1. Left ventricular ejection fraction, by estimation, is 60 to 65%. The left ventricle has normal function. The left ventricle has no regional wall motion abnormalities. There is mild left ventricular hypertrophy. Left ventricular diastolic parameters  are consistent with Grade I diastolic dysfunction (impaired relaxation). 2. Right ventricular systolic function is normal. The right ventricular size is normal. 3. The mitral valve is normal in structure. No evidence of mitral valve regurgitation. 4. There is mild calcification of the aortic valve. Aortic valve regurgitation is not visualized. 5. The inferior vena cava is normal in size with greater than 50% respiratory variability, suggesting right atrial pressure of 3 mmHg.   Neuro/Psych PSYCHIATRIC DISORDERS Anxiety Depression Bipolar Disorder negative neurological ROS     GI/Hepatic negative GI ROS, Neg liver ROS,   Endo/Other  negative endocrine ROS  Renal/GU Renal InsufficiencyRenal disease     Musculoskeletal negative musculoskeletal ROS (+)   Abdominal   Peds  Hematology negative hematology ROS (+)   Anesthesia Other Findings   Reproductive/Obstetrics                            Anesthesia Physical Anesthesia Plan  ASA: 3  Anesthesia Plan: MAC   Post-op Pain  Management:    Induction: Intravenous  PONV Risk Score and Plan: 1 and Propofol infusion  Airway Management Planned: Natural Airway and Simple Face Mask  Additional Equipment:   Intra-op Plan:   Post-operative Plan:   Informed Consent: I have reviewed the patients History and Physical, chart, labs and discussed the procedure including the risks, benefits and alternatives for the proposed anesthesia with the patient or authorized representative who has indicated his/her understanding and acceptance.     Dental advisory given  Plan Discussed with: Anesthesiologist and CRNA  Anesthesia Plan Comments:        Anesthesia Quick Evaluation

## 2022-05-17 NOTE — Assessment & Plan Note (Signed)
Allow permissive HTn for now 

## 2022-05-17 NOTE — Op Note (Signed)
[facility] Patient Name: Stephanie Benton Procedure Date : 05/17/2022 MRN: [id number] Attending MD: Jeani Hawking , MD Date of Birth: 1951/06/04 CSN: [id number] Age: 71 Admit Type: Inpatient Procedure:                Upper GI endoscopy Indications:              Epigastric abdominal pain, Abdominal pain in the                            right upper quadrant, Nausea with vomiting Providers:                Jeani Hawking, MD, Jasmine Pang, RN, Beryle Beams, Technician Referring MD:              Medicines:                Propofol per Anesthesia Complications:            No immediate complications. Estimated Blood Loss:     Estimated blood loss was minimal. Procedure:                Pre-Anesthesia Assessment:                           - Prior to the procedure, a History and Physical                            was performed, and patient medications and                            allergies were reviewed. The patient's tolerance of                            previous anesthesia was also reviewed. The risks                            and benefits of the procedure and the sedation                            options and risks were discussed with the patient.                            All questions were answered, and informed consent                            was obtained. Prior Anticoagulants: The patient has                            taken no previous anticoagulant or antiplatelet                            agents. ASA Grade Assessment: III - A patient with  severe systemic disease. After reviewing the risks                            and benefits, the patient was deemed in                            satisfactory condition to undergo the procedure.                           - Sedation was administered by an anesthesia                            professional. Deep sedation was attained.                           After  obtaining informed consent, the endoscope was                            passed under direct vision. Throughout the                            procedure, the patient's blood pressure, pulse, and                            oxygen saturations were monitored continuously. The                            [id number]-[id number] ([id number] Olympus endoscope was introduced                            through the mouth, and advanced to the second part                            of duodenum. The upper GI endoscopy was                            accomplished without difficulty. The patient                            tolerated the procedure well. Scope In: Scope Out: Findings:      LA Grade B (one or more mucosal breaks greater than 5 mm, not extending       between the tops of two mucosal folds) esophagitis with bleeding was       found at the gastroesophageal junction. Biopsies were taken with a cold       forceps for histology.      A 4 cm hiatal hernia was present.      Diffuse moderate inflammation characterized by erythema was found in the       prepyloric region of the stomach. Biopsies were taken with a cold       forceps for Helicobacter pylori testing.      Patchy moderately erythematous mucosa without active bleeding and with       no stigmata of bleeding was found in the duodenal bulb.      It was only at  the conclusion of the procedure that blood was noted at       the GE junction. With closer inspection there was evidence of an       esophagitis. Some of the mucosa on the gastric side appeared abnormal       and biopsies were obtained. In the gastric prepylori region there was       evidence of erythema, but not ulceration. Multiple cold biopsies were       obtained. There was no evidence of any ulcers in the upper GI tract. Impression:               - LA Grade B esophagitis with bleeding. Biopsied.                           - 4 cm hiatal hernia.                           - Gastritis. Biopsied.                            - Erythematous duodenopathy. Recommendation:           - Return patient to hospital ward for ongoing care.                           - Resume regular diet.                           - Continue present medications.                           - Await pathology results.                           - PPI BID.                           - Sucralfate 1 gram TID. Procedure Code(s):        --- Professional ---                           [phone], Esophagogastroduodenoscopy, flexible,                            transoral; with biopsy, single or multiple Diagnosis Code(s):        --- Professional ---                           K20.91, Esophagitis, unspecified with bleeding                           K44.9, Diaphragmatic hernia without obstruction or                            gangrene                           K29.70, Gastritis, unspecified, without bleeding  K31.89, Other diseases of stomach and duodenum                           R10.13, Epigastric pain                           R10.11, Right upper quadrant pain                           R11.2, Nausea with vomiting, unspecified CPT copyright 2019 [employer]. All rights reserved. The codes documented in this report are preliminary and upon coder review may  be revised to meet current compliance requirements. Jeani Hawking, MD Jeani Hawking, MD 05/17/2022 3:57:56 PM This report has been signed electronically. Number of Addenda: 0

## 2022-05-17 NOTE — Consult Note (Signed)
Reason for Consult: Abdominal pain and elevated lipase Referring Physician: [facility]  [name] HPI: This is a 71 year old female with a PMH of HTN and hyperlipidemia admitted for complaints of persistent upper abdominal pain, nausea, and vomiting for the past 3-4 weeks.  She reports an acute onset of her symptoms and it was not associated with any PO intake.  She initially presented to the ER on 05/09/2022 with complaints of chest pain, back pain, and abdominal pain.  The work up was showed hyperkalemia, renal insufficiency, and an elevated lipase.  Her liver enzymes were normal.  She was symptomatically treated and discharged home, but she was offered observation status.  The patient declined at that time.  The CT scan performed during that visit showed a large hiatal hernia and cholelithiasis.  There was a stable seroma from the prior ventral hernia mesh.  Her symptoms continued to persist and she represented to the ER yesterday evening.  Repeat blood work showed a worsening of her creatinine and a continued elevation in her lipase.  There is a recent history of NSAID use.  In 2009 she had an EGD/colonoscopy.  The EGD was for complaints of abdominal pain and the work up only showed a nonspecific gastritis.  Her screening colonoscopy was negative for any polyps.    Past Medical History:  Diagnosis Date   Hyperlipidemia    Hypertension     Past Surgical History:  Procedure Laterality Date   ABDOMINAL HYSTERECTOMY     APPENDECTOMY     HERNIA REPAIR     TONSILLECTOMY     TUBAL LIGATION      Family History  Problem Relation Age of Onset   Heart failure Mother    Cancer Father    Cancer Sister     Social History:  reports that she has been smoking cigarettes. She has been smoking an average of .75 packs per day. She has never used smokeless tobacco. She reports that she does not drink alcohol and does not use drugs.  Allergies:  Allergies  Allergen Reactions   Fish-Derived  Products Anaphylaxis   Morphine And Related Anaphylaxis and Swelling   Belsomra [Suvorexant] Other (See Comments)    Caused nightmares   Codeine Nausea And Vomiting   Fentanyl Hives   Ibuprofen Nausea Only and Other (See Comments)    Severe nausea- history of stomach ulcers   Nsaids Other (See Comments)    History of stomach ulcers   Other Other (See Comments)    History of stomach ulcers   Penicillins Hives    Did it involve swelling of the face/tongue/throat, SOB, or low BP? Yes Did it involve sudden or severe rash/hives, skin peeling, or any reaction on the inside of your mouth or nose? Yes Did you need to seek medical attention at a hospital or doctor's office? Yes When did it last happen? More than 10 years ago If all above answers are "NO", may proceed with cephalosporin use.    Sulfa Antibiotics Swelling    Site of swelling not recalled   Tolmetin Other (See Comments)    History of stomach ulcers    Medications: Scheduled:  [MAR Hold] latanoprost  1 drop Both Eyes QHS   [MAR Hold] pantoprazole (PROTONIX) IV  40 mg Intravenous Q12H   [MAR Hold] risperiDONE  1 mg Oral QHS   [MAR Hold] venlafaxine XR  37.5 mg Oral QHS   Continuous:  sodium chloride Stopped (05/17/22 1451)   sodium chloride  20 mL/hr at 05/17/22 1504   lactated ringers Stopped (05/17/22 1451)    Results for orders placed or performed during the hospital encounter of 05/16/22 (from the past 24 hour(s))  CBC with Differential     Status: Abnormal   Collection Time: 05/16/22  3:16 PM  Result Value Ref Range   WBC 7.1 4.0 - 10.5 K/uL   RBC 5.44 (H) 3.87 - 5.11 MIL/uL   Hemoglobin 14.4 12.0 - 15.0 g/dL   HCT 64.3 32.9 - 51.8 %   MCV 76.8 (L) 80.0 - 100.0 fL   MCH 26.5 26.0 - 34.0 pg   MCHC 34.4 30.0 - 36.0 g/dL   RDW 84.1 66.0 - 63.0 %   Platelets 299 150 - 400 K/uL   nRBC 0.0 0.0 - 0.2 %   Neutrophils Relative % 74 %   Neutro Abs 5.2 1.7 - 7.7 K/uL   Lymphocytes Relative 15 %   Lymphs Abs 1.1 0.7 -  4.0 K/uL   Monocytes Relative 10 %   Monocytes Absolute 0.7 0.1 - 1.0 K/uL   Eosinophils Relative 0 %   Eosinophils Absolute 0.0 0.0 - 0.5 K/uL   Basophils Relative 1 %   Basophils Absolute 0.0 0.0 - 0.1 K/uL   Immature Granulocytes 0 %   Abs Immature Granulocytes 0.03 0.00 - 0.07 K/uL  Comprehensive metabolic panel     Status: Abnormal   Collection Time: 05/16/22  3:16 PM  Result Value Ref Range   Sodium 135 135 - 145 mmol/L   Potassium 3.0 (L) 3.5 - 5.1 mmol/L   Chloride 87 (L) 98 - 111 mmol/L   CO2 21 (L) 22 - 32 mmol/L   Glucose, Bld 107 (H) 70 - 99 mg/dL   BUN 21 8 - 23 mg/dL   Creatinine, Ser 1.60 (H) 0.44 - 1.00 mg/dL   Calcium 10.9 8.9 - 32.3 mg/dL   Total Protein 7.2 6.5 - 8.1 g/dL   Albumin 3.9 3.5 - 5.0 g/dL   AST 39 15 - 41 U/L   ALT 25 0 - 44 U/L   Alkaline Phosphatase 94 38 - 126 U/L   Total Bilirubin 2.0 (H) 0.3 - 1.2 mg/dL   GFR, Estimated 25 (L) >60 mL/min   Anion gap 27 (H) 5 - 15  Lipase, blood     Status: Abnormal   Collection Time: 05/16/22  3:16 PM  Result Value Ref Range   Lipase 309 (H) 11 - 51 U/L  Troponin I (High Sensitivity)     Status: Abnormal   Collection Time: 05/16/22  3:16 PM  Result Value Ref Range   Troponin I (High Sensitivity) 31 (H) <18 ng/L  Troponin I (High Sensitivity)     Status: Abnormal   Collection Time: 05/16/22  6:00 PM  Result Value Ref Range   Troponin I (High Sensitivity) 28 (H) <18 ng/L  Lactic acid, plasma     Status: None   Collection Time: 05/16/22 10:30 PM  Result Value Ref Range   Lactic Acid, Venous 1.5 0.5 - 1.9 mmol/L  Lactic acid, plasma     Status: None   Collection Time: 05/17/22 12:39 AM  Result Value Ref Range   Lactic Acid, Venous 1.4 0.5 - 1.9 mmol/L  CK     Status: None   Collection Time: 05/17/22 12:39 AM  Result Value Ref Range   Total CK 138 38 - 234 U/L  Magnesium     Status: Abnormal   Collection Time: 05/17/22 12:39 AM  Result Value Ref Range   Magnesium 1.4 (L) 1.7 - 2.4 mg/dL   Phosphorus     Status: None   Collection Time: 05/17/22 12:39 AM  Result Value Ref Range   Phosphorus 3.7 2.5 - 4.6 mg/dL  TSH     Status: None   Collection Time: 05/17/22 12:39 AM  Result Value Ref Range   TSH 3.663 0.350 - 4.500 uIU/mL  Protime-INR     Status: None   Collection Time: 05/17/22 12:39 AM  Result Value Ref Range   Prothrombin Time 14.2 11.4 - 15.2 seconds   INR 1.1 0.8 - 1.2  Salicylate level     Status: Abnormal   Collection Time: 05/17/22 12:39 AM  Result Value Ref Range   Salicylate Lvl <7.0 (L) 7.0 - 30.0 mg/dL  Osmolality     Status: None   Collection Time: 05/17/22 12:39 AM  Result Value Ref Range   Osmolality 287 275 - 295 mOsm/kg  Beta-hydroxybutyric acid     Status: Abnormal   Collection Time: 05/17/22 12:39 AM  Result Value Ref Range   Beta-Hydroxybutyric Acid 4.06 (H) 0.05 - 0.27 mmol/L  Basic metabolic panel     Status: Abnormal   Collection Time: 05/17/22 12:39 AM  Result Value Ref Range   Sodium 137 135 - 145 mmol/L   Potassium 3.5 3.5 - 5.1 mmol/L   Chloride 88 (L) 98 - 111 mmol/L   CO2 29 22 - 32 mmol/L   Glucose, Bld 92 70 - 99 mg/dL   BUN 23 8 - 23 mg/dL   Creatinine, Ser 1.61 (H) 0.44 - 1.00 mg/dL   Calcium 9.3 8.9 - 09.6 mg/dL   GFR, Estimated 25 (L) >60 mL/min   Anion gap 20 (H) 5 - 15  I-Stat venous blood gas, ED     Status: Abnormal   Collection Time: 05/17/22 12:43 AM  Result Value Ref Range   pH, Ven 7.468 (H) 7.25 - 7.43   pCO2, Ven 40.1 (L) 44 - 60 mmHg   pO2, Ven 24 (LL) 32 - 45 mmHg   Bicarbonate 29.1 (H) 20.0 - 28.0 mmol/L   TCO2 30 22 - 32 mmol/L   O2 Saturation 47 %   Acid-Base Excess 5.0 (H) 0.0 - 2.0 mmol/L   Sodium 132 (L) 135 - 145 mmol/L   Potassium 3.5 3.5 - 5.1 mmol/L   Calcium, Ion 1.05 (L) 1.15 - 1.40 mmol/L   HCT 38.0 36.0 - 46.0 %   Hemoglobin 12.9 12.0 - 15.0 g/dL   Sample type VENOUS    Comment NOTIFIED PHYSICIAN   Urinalysis, Complete w Microscopic Urine, Clean Catch     Status: Abnormal    Collection Time: 05/17/22  2:08 AM  Result Value Ref Range   Color, Urine AMBER (A) YELLOW   APPearance CLOUDY (A) CLEAR   Specific Gravity, Urine 1.018 1.005 - 1.030   pH 5.0 5.0 - 8.0   Glucose, UA NEGATIVE NEGATIVE mg/dL   Hgb urine dipstick NEGATIVE NEGATIVE   Bilirubin Urine SMALL (A) NEGATIVE   Ketones, ur 5 (A) NEGATIVE mg/dL   Protein, ur 30 (A) NEGATIVE mg/dL   Nitrite NEGATIVE NEGATIVE   Leukocytes,Ua NEGATIVE NEGATIVE   RBC / HPF 0-5 0 - 5 RBC/hpf   WBC, UA 6-10 0 - 5 WBC/hpf   Bacteria, UA FEW (A) NONE SEEN   Squamous Epithelial / LPF 11-20 0 - 5   Mucus PRESENT    Hyaline Casts, UA PRESENT   Creatinine, urine,  random     Status: None   Collection Time: 05/17/22  2:08 AM  Result Value Ref Range   Creatinine, Urine 364 mg/dL  Osmolality, urine     Status: None   Collection Time: 05/17/22  2:08 AM  Result Value Ref Range   Osmolality, Ur 449 300 - 900 mOsm/kg  Sodium, urine, random     Status: None   Collection Time: 05/17/22  2:08 AM  Result Value Ref Range   Sodium, Ur <10 mmol/L  Prealbumin     Status: Abnormal   Collection Time: 05/17/22  4:47 AM  Result Value Ref Range   Prealbumin 13 (L) 18 - 38 mg/dL  Lipase, blood     Status: Abnormal   Collection Time: 05/17/22  4:47 AM  Result Value Ref Range   Lipase 210 (H) 11 - 51 U/L  Comprehensive metabolic panel     Status: Abnormal   Collection Time: 05/17/22  4:47 AM  Result Value Ref Range   Sodium 134 (L) 135 - 145 mmol/L   Potassium 3.0 (L) 3.5 - 5.1 mmol/L   Chloride 92 (L) 98 - 111 mmol/L   CO2 24 22 - 32 mmol/L   Glucose, Bld 87 70 - 99 mg/dL   BUN 22 8 - 23 mg/dL   Creatinine, Ser 1.84 (H) 0.44 - 1.00 mg/dL   Calcium 9.0 8.9 - 10.3 mg/dL   Total Protein 6.0 (L) 6.5 - 8.1 g/dL   Albumin 3.2 (L) 3.5 - 5.0 g/dL   AST 27 15 - 41 U/L   ALT 17 0 - 44 U/L   Alkaline Phosphatase 74 38 - 126 U/L   Total Bilirubin 1.4 (H) 0.3 - 1.2 mg/dL   GFR, Estimated 29 (L) >60 mL/min   Anion gap 18 (H) 5 - 15   CBC     Status: Abnormal   Collection Time: 05/17/22  4:47 AM  Result Value Ref Range   WBC 8.2 4.0 - 10.5 K/uL   RBC 4.46 3.87 - 5.11 MIL/uL   Hemoglobin 11.7 (L) 12.0 - 15.0 g/dL   HCT 34.8 (L) 36.0 - 46.0 %   MCV 78.0 (L) 80.0 - 100.0 fL   MCH 26.2 26.0 - 34.0 pg   MCHC 33.6 30.0 - 36.0 g/dL   RDW 14.3 11.5 - 15.5 %   Platelets 241 150 - 400 K/uL   nRBC 0.0 0.0 - 0.2 %  Magnesium     Status: Abnormal   Collection Time: 05/17/22  4:47 AM  Result Value Ref Range   Magnesium 1.5 (L) 1.7 - 2.4 mg/dL  Phosphorus     Status: None   Collection Time: 05/17/22  4:47 AM  Result Value Ref Range   Phosphorus 3.2 2.5 - 4.6 mg/dL  Lipid panel     Status: Abnormal   Collection Time: 05/17/22  4:47 AM  Result Value Ref Range   Cholesterol 177 0 - 200 mg/dL   Triglycerides 92 <150 mg/dL   HDL 42 >40 mg/dL   Total CHOL/HDL Ratio 4.2 RATIO   VLDL 18 0 - 40 mg/dL   LDL Cholesterol 117 (H) 0 - 99 mg/dL  SARS Coronavirus 2 by RT PCR (hospital order, performed in [facility] hospital lab) *cepheid single result test* Anterior Nasal Swab     Status: None   Collection Time: 05/17/22  5:00 AM   Specimen: Anterior Nasal Swab  Result Value Ref Range   SARS Coronavirus 2 by RT PCR NEGATIVE NEGATIVE  ECHOCARDIOGRAM COMPLETE  Result Date: 05/17/2022    ECHOCARDIOGRAM REPORT   Patient Name:   HADAS JESSOP Date of Exam: 05/17/2022 Medical Rec #:  [id number]      Height:       65.0 in Accession #:    [id number]     Weight:       227.0 lb Date of Birth:  [birth year]      BSA:          2.088 m Patient Age:    71 years       BP:           124/70 mmHg Patient Gender: F              HR:           90 bpm. Exam Location:  Inpatient Procedure: 2D Echo, Cardiac Doppler and Color Doppler Indications:    Chest Pain R07.9  History:        Patient has no prior history of Echocardiogram examinations.                 COPD, Signs/Symptoms:Chest Pain; Risk Factors:Hypertension and                 Current Smoker.   Sonographer:    Lucendia Herrlich Referring Phys: [id number] [name] DOUTOVA IMPRESSIONS  1. Left ventricular ejection fraction, by estimation, is 60 to 65%. The left ventricle has normal function. The left ventricle has no regional wall motion abnormalities. There is mild left ventricular hypertrophy. Left ventricular diastolic parameters are consistent with Grade I diastolic dysfunction (impaired relaxation).  2. Right ventricular systolic function is normal. The right ventricular size is normal.  3. The mitral valve is normal in structure. No evidence of mitral valve regurgitation.  4. There is mild calcification of the aortic valve. Aortic valve regurgitation is not visualized.  5. The inferior vena cava is normal in size with greater than 50% respiratory variability, suggesting right atrial pressure of 3 mmHg. FINDINGS  Left Ventricle: Left ventricular ejection fraction, by estimation, is 60 to 65%. The left ventricle has normal function. The left ventricle has no regional wall motion abnormalities. The left ventricular internal cavity size was normal in size. There is  mild left ventricular hypertrophy. Left ventricular diastolic parameters are consistent with Grade I diastolic dysfunction (impaired relaxation). Right Ventricle: The right ventricular size is normal. No increase in right ventricular wall thickness. Right ventricular systolic function is normal. Left Atrium: Left atrial size was normal in size. Right Atrium: Right atrial size was normal in size. Pericardium: There is no evidence of pericardial effusion. Mitral Valve: The mitral valve is normal in structure. No evidence of mitral valve regurgitation. Tricuspid Valve: The tricuspid valve is normal in structure. Tricuspid valve regurgitation is not demonstrated. Aortic Valve: There is mild calcification of the aortic valve. Aortic valve regurgitation is not visualized. Aortic valve mean gradient measures 7.0 mmHg. Aortic valve peak gradient measures  13.0 mmHg. Aortic valve area, by VTI measures 2.79 cm. Pulmonic Valve: The pulmonic valve was normal in structure. Pulmonic valve regurgitation is not visualized. Aorta: The aortic root and ascending aorta are structurally normal, with no evidence of dilitation. Venous: The inferior vena cava is normal in size with greater than 50% respiratory variability, suggesting right atrial pressure of 3 mmHg. IAS/Shunts: No atrial level shunt detected by color flow Doppler.  LEFT VENTRICLE PLAX 2D LVIDd:         4.00 cm   Diastology LVIDs:  2.30 cm   LV e' medial:    7.07 cm/s LV PW:         1.10 cm   LV E/e' medial:  13.6 LV IVS:        1.00 cm   LV e' lateral:   9.25 cm/s LVOT diam:     2.05 cm   LV E/e' lateral: 10.4 LV SV:         79 LV SV Index:   38 LVOT Area:     3.30 cm  RIGHT VENTRICLE             IVC RV S prime:     14.90 cm/s  IVC diam: 1.70 cm TAPSE (M-mode): 2.4 cm LEFT ATRIUM             Index        RIGHT ATRIUM           Index LA diam:        3.80 cm 1.82 cm/m   RA Area:     13.70 cm LA Vol (A2C):   35.4 ml 16.96 ml/m  RA Volume:   31.70 ml  15.19 ml/m LA Vol (A4C):   58.5 ml 28.02 ml/m LA Biplane Vol: 45.6 ml 21.84 ml/m  AORTIC VALVE AV Area (Vmax):    2.79 cm AV Area (Vmean):   2.70 cm AV Area (VTI):     2.79 cm AV Vmax:           180.00 cm/s AV Vmean:          120.000 cm/s AV VTI:            0.284 m AV Peak Grad:      13.0 mmHg AV Mean Grad:      7.0 mmHg LVOT Vmax:         152.00 cm/s LVOT Vmean:        98.100 cm/s LVOT VTI:          0.240 m LVOT/AV VTI ratio: 0.85  AORTA Ao Root diam: 2.70 cm Ao Asc diam:  3.30 cm MITRAL VALVE MV Area (PHT): 4.17 cm     SHUNTS MV Decel Time: 182 msec     Systemic VTI:  0.24 m MV E velocity: 96.10 cm/s   Systemic Diam: 2.05 cm MV A velocity: 127.00 cm/s MV E/A ratio:  0.76 Photographer signed by Carolan Clines Signature Date/Time: 05/17/2022/11:31:04 AM    Final    US Abdomen Limited RUQ (LIVER/GB)  Result Date: 05/16/2022 CLINICAL DATA:   Right upper quadrant pain, nausea/vomiting. EXAM: ULTRASOUND ABDOMEN LIMITED RIGHT UPPER QUADRANT COMPARISON:  05/09/2022. FINDINGS: Gallbladder: Stones and sludge are present within the gallbladder. No gallbladder wall thickening or pericholecystic fluid. A Murphy sign is noted by sonographer. Common bile duct: Diameter: 3.6 mm. Liver: No focal lesion identified. Increased parenchymal echogenicity. Portal vein is patent on color Doppler imaging with normal direction of blood flow towards the liver. Other: No free fluid. IMPRESSION: 1. Cholelithiasis and sludge in the gallbladder with positive Murphy sign. No gallbladder wall thickening or pericholecystic edema. Clinical correlation is recommended to exclude acute cholecystitis. 2. Hepatic steatosis. Electronically Signed   By: Thornell Sartorius M.D.   On: 05/16/2022 22:12   DG Chest 1 View  Result Date: 05/16/2022 CLINICAL DATA:  Chest pain EXAM: CHEST  1 VIEW COMPARISON:  Chest 05/09/2022 FINDINGS: Heart size and vascularity normal. Lungs clear without infiltrate effusion. Moderate to large hiatal hernia. IMPRESSION: No active disease. Electronically  Signed   By: Marlan Palau M.D.   On: 05/16/2022 16:21    ROS:  As stated above in the HPI otherwise negative.  Blood pressure 133/72, pulse 85, temperature 97.6 F (36.4 C), temperature source Temporal, resp. rate (!) 22, height 5\' 5"  (1.651 m), weight 103 kg, SpO2 96 %.    PE: Gen: NAD, Alert and Oriented HEENT:  [place]/AT, EOMI Neck: Supple, no LAD Lungs: CTA Bilaterally CV: RRR without M/G/R ABD: Soft, tender in the RUQ and epigastrium, +BS Ext: No C/C/E  Assessment/Plan: 1) Epigastric pain and RUQ pain. 2) Nausea/vomiting. 3) Dehydration. 4) AKI. 5) Cholelithiasis. 6) Elevated lipase.   The patient is tender to palpation in the epigastrium and the RUQ.  The patient states that she had PUD, but that was an incorrect recollection.  She never had an ulcer.  Further evaluation with an EGD will  be performed.  A gastric ulcer can be the source of her elevated lipase.  The AKI can contribute to the elevated lipase, but typically a persistently elevated lipase is secondary to chronic renal disease.  There is evidence of cholelithiasis, but her biliary ducts are no dilated.  Plan: 1) EGD now and further recommendations pending the findings.  Nori Poland D 05/17/2022, 3:08 PM

## 2022-05-17 NOTE — Evaluation (Signed)
Occupational Therapy Evaluation Patient Details Name: Stephanie Benton MRN: [id number] DOB: 1951-01-19 Today's Date: 05/17/2022   History of Present Illness Pt is a 71 y/o female presenting with dehydration in setting of N&V and diarrhea for the past 2 weeks. PMH:  HTN, HLD, asthma, glaucoma, GERD   Clinical Impression   PTA, pt lives alone in level entry apartment, typically Modified Independent with ADLs, household IADLs and mobility using cane. Pt presents now with deficits in strength, endurance and exacerbation of chronic BLE pain. Pt opted to use RW for mobility d/t pain. Overall, pt requires min guard at most for LB ADLs and mobility using RW. Educated re: safety strategies for tub transfers, monitoring of symptoms to determine optimal AD for mobility at home. Anticipate no OT needs at DC but will follow acutely if pt remains admitted.       Recommendations for follow up therapy are one component of a multi-disciplinary discharge planning process, led by the attending physician.  Recommendations may be updated based on patient status, additional functional criteria and insurance authorization.   Follow Up Recommendations  No OT follow up    Assistance Recommended at Discharge PRN  Patient can return home with the following Assistance with cooking/housework;Assist for transportation    Functional Status Assessment  Patient has had a recent decline in their functional status and demonstrates the ability to make significant improvements in function in a reasonable and predictable amount of time.  Equipment Recommendations  BSC/3in1    Recommendations for Other Services       Precautions / Restrictions Precautions Precautions: Fall Restrictions Weight Bearing Restrictions: No      Mobility Bed Mobility Overal bed mobility: Modified Independent             General bed mobility comments: increased time/effort    Transfers Overall transfer level: Needs  assistance Equipment used: Rolling walker (2 wheels) Transfers: Sit to/from Stand Sit to Stand: Min guard           General transfer comment: for safety from high stretcher height      Balance Overall balance assessment: Needs assistance Sitting-balance support: No upper extremity supported, Feet supported Sitting balance-Leahy Scale: Good     Standing balance support: Bilateral upper extremity supported, During functional activity Standing balance-Leahy Scale: Poor Standing balance comment: reliant on UE support to offload painful BLE                           ADL either performed or assessed with clinical judgement   ADL Overall ADL's : Needs assistance/impaired Eating/Feeding: Independent Eating/Feeding Details (indicate cue type and reason): though NPO at time of eval, anticipate no issues with self feeding Grooming: Set up;Sitting;Wash/dry face   Upper Body Bathing: Set up;Sitting   Lower Body Bathing: Min guard;Sit to/from stand   Upper Body Dressing : Set up   Lower Body Dressing: Min guard;Sit to/from stand   Toilet Transfer: Min guard;Ambulation;Rolling walker (2 wheels)   Toileting- Clothing Manipulation and Hygiene: Min guard;Sitting/lateral lean;Sit to/from stand       Functional mobility during ADLs: Min guard;Rolling walker (2 wheels) General ADL Comments: Pt limited by chronic BLE pain this AM but able to mobilize in room - opted to use RW today rather than typical cane. Encouraged pt to have family present for initial tub transfer attempts at home vs sponge bathing as needed. Pt reports feeling that she needs a BSC to use in bedroom at night  to decrease fall risk     Vision Ability to See in Adequate Light: 0 Adequate Patient Visual Report: No change from baseline Vision Assessment?: No apparent visual deficits     Perception     Praxis      Pertinent Vitals/Pain Pain Assessment Pain Assessment: 0-10 Pain Score: 7  Pain Location:  BLE Pain Descriptors / Indicators: Grimacing, Guarding, Sharp Pain Intervention(s): Monitored during session, Other (comment) (notified RN;chronic pain for pt)     Hand Dominance Right   Extremity/Trunk Assessment Upper Extremity Assessment Upper Extremity Assessment: Generalized weakness   Lower Extremity Assessment Lower Extremity Assessment: Defer to PT evaluation   Cervical / Trunk Assessment Cervical / Trunk Assessment: Normal   Communication Communication Communication: No difficulties   Cognition Arousal/Alertness: Awake/alert Behavior During Therapy: WFL for tasks assessed/performed Overall Cognitive Status: Within Functional Limits for tasks assessed                                       General Comments  VSS on RA. pt endorsed mild dizziness that did not progress during activity    Exercises     Shoulder Instructions      Home Living Family/patient expects to be discharged to:: Private residence Living Arrangements: Alone Available Help at Discharge: Family;Available PRN/intermittently Type of Home: Apartment Home Access: Level entry     Home Layout: One level     Bathroom Shower/Tub: Teacher, early years/pre: Standard (w/ riser on top)     Home Equipment: Cane - quad;Shower seat;Toilet riser;Rolling Walker (2 wheels)          Prior Functioning/Environment Prior Level of Function : Independent/Modified Independent             Mobility Comments: use of cane for mobility ADLs Comments: Modified Independent with ADLs, household IADLs. recently stopped driving - family recently asked her to stop driving. they assist with transportation needs now        OT Problem List: Decreased strength;Decreased activity tolerance;Impaired balance (sitting and/or standing);Pain      OT Treatment/Interventions: Self-care/ADL training;Therapeutic exercise;Energy conservation;DME and/or AE instruction;Therapeutic activities    OT  Goals(Current goals can be found in the care plan section) Acute Rehab OT Goals Patient Stated Goal: pain control, feel better and go home soon OT Goal Formulation: With patient Time For Goal Achievement: 05/31/22 Potential to Achieve Goals: Good  OT Frequency: Min 2X/week    Co-evaluation              AM-PAC OT "6 Clicks" Daily Activity     Outcome Measure Help from another person eating meals?: None Help from another person taking care of personal grooming?: A Little Help from another person toileting, which includes using toliet, bedpan, or urinal?: A Little Help from another person bathing (including washing, rinsing, drying)?: A Little Help from another person to put on and taking off regular upper body clothing?: A Little Help from another person to put on and taking off regular lower body clothing?: A Little 6 Click Score: 19   End of Session Equipment Utilized During Treatment: Rolling walker (2 wheels);Gait belt Nurse Communication: Mobility status  Activity Tolerance: Patient tolerated treatment well;Patient limited by pain Patient left: in bed;with call bell/phone within reach  OT Visit Diagnosis: Other abnormalities of gait and mobility (R26.89)                Time:  [id number] OT Time Calculation (min): 16 min Charges:  OT General Charges $OT Visit: 1 Visit OT Evaluation $OT Eval Low Complexity: 1 Low  Malachy Chamber, OTR/L Acute Rehab Services Office: [phone]   Layla Maw 05/17/2022, 9:20 AM

## 2022-05-17 NOTE — Assessment & Plan Note (Signed)
lactica cid wnl, could be in in the setting of renal failure vs starvation acidosis

## 2022-05-17 NOTE — Transfer of Care (Signed)
Immediate Anesthesia Transfer of Care Note  Patient: Stephanie Benton  Procedure(s) Performed: ESOPHAGOGASTRODUODENOSCOPY (EGD) WITH PROPOFOL BIOPSY  Patient Location: PACU  Anesthesia Type:MAC  Level of Consciousness: awake, alert  and oriented  Airway & Oxygen Therapy: Patient Spontanous Breathing  Post-op Assessment: Report given to RN and Post -op Vital signs reviewed and stable  Post vital signs: Reviewed and stable  Last Vitals:  Vitals Value Taken Time  BP    Temp    Pulse 97 05/17/22 1538  Resp 24 05/17/22 1538  SpO2 96 % 05/17/22 1538  Vitals shown include unvalidated device data.  Last Pain:  Vitals:   05/17/22 1444  TempSrc: Temporal  PainSc: 6          Complications: No notable events documented.

## 2022-05-17 NOTE — Progress Notes (Addendum)
PROGRESS NOTE  Stephanie Benton JGO:[id number] DOB: 1951-04-05 DOA: 05/16/2022 PCP: Sharmon Revere, MD  HPI/Recap of past 24 hours: Stephanie Benton is a 71 y.o. female with medical history significant of HTN, HLD, asthma, glaucoma, GERD, presented with intermittent, worsening nausea, vomiting, and epigastric pain on going for the past 2 weeks. Pt was seen about a week ago of which CTA of her chest and CT of abdomen was unremarkable, noted elevated lipase. Pt was discharged, but never got better. Pt went to her primary care and was told to go to ED due to abnormal labs. Patient does use BC powders daily, denies any other NSAID use.  In the ED, lipase mildly elevated, ultrasound showed positive Murphy sign, cholelithiasis, but no gallbladder wall thickening or pericholecystic edema.  General surgery and GI consulted.  Patient admitted for further management.    Today, patient still with intermittent nausea/vomiting, with epigastric pain on palpation.  Denies any left-sided chest pain, shortness of breath, fever/chills.   Assessment/Plan: Principal Problem:   Dehydration Active Problems:   Hypertension   Bipolar disorder, in full remission, most recent episode mixed (HCC)   Pancreatitis    Intractable nausea/vomiting with epigastric pain ??  PUD Vs symptomatic cholelithiasis Vs chronic cholecystitis Vs mild pancreatitis History of GERD Currently afebrile, with no leukocytosis Mildly elevated lipase, elevated bilirubin CT A/P unremarkable, RUQ USS showed cholelithiasis, but no gallbladder wall thickening or pericholecystic edema GI on board, plan for EGD General surgery on board, appreciate recs may have cholecystectomy pending on GI evaluation N.p.o. 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   Weight as of 05/09/22: 103 kg.     Code Status: Full  Family Communication: None at bedside  Disposition Plan: Status is: Observation The patient will require care spanning > 2 midnights and should be moved to inpatient because: Level of care      Consultants: GI General surgery  Procedures: EGD  Antimicrobials: None  DVT prophylaxis: SCD   Objective: Vitals:   05/17/22 1145 05/17/22 1215 05/17/22 1245 05/17/22 1300  BP: (!) 129/59 123/67 113/63 124/83  Pulse: 87 90 90 90  Resp: 11 16 18 19   Temp:      TempSrc:      SpO2: 97% 97% 95% 95%   No intake or output data in the 24 hours ending 05/17/22 1445 There were no vitals filed for this visit.  Exam: General: NAD  Cardiovascular: S1, S2 present Respiratory: CTAB Abdomen: Soft, nontender, nondistended, bowel sounds present Musculoskeletal: trace bilateral pedal edema noted Skin: Normal Psychiatry: Normal mood     Data Reviewed: CBC: Recent Labs  Lab 05/16/22 1516 05/17/22 0043 05/17/22 0447  WBC 7.1  --  8.2  NEUTROABS 5.2  --   --   HGB 14.4 12.9 11.7*  HCT  41.8 38.0 34.8*  MCV 76.8*  --  78.0*  PLT 299  --  241   Basic Metabolic Panel: Recent Labs  Lab 05/16/22 1516 05/17/22 0039 05/17/22 0043 05/17/22 0447  NA 135 137 132* 134*  K 3.0* 3.5 3.5 3.0*  CL 87* 88*  --  92*  CO2 21* 29  --  24  GLUCOSE 107* 92  --  87  BUN 21 23  --  22  CREATININE 2.05* 2.05*  --   1.84*  CALCIUM 10.1 9.3  --  9.0  MG  --  1.4*  --  1.5*  PHOS  --  3.7  --  3.2   GFR: Estimated Creatinine Clearance: 32.9 mL/min (A) (by C-G formula based on SCr of 1.84 mg/dL (H)). Liver Function Tests: Recent Labs  Lab 05/16/22 1516 05/17/22 0447  AST 39 27  ALT 25 17  ALKPHOS 94 74  BILITOT 2.0* 1.4*  PROT 7.2 6.0*  ALBUMIN 3.9 3.2*   Recent Labs  Lab 05/16/22 1516 05/17/22 0447  LIPASE 309* 210*   No results for input(s): "AMMONIA" in the last 168 hours. Coagulation Profile: Recent Labs  Lab 05/17/22 0039  INR 1.1   Cardiac Enzymes: Recent Labs  Lab 05/17/22 0039  CKTOTAL 138   BNP (last 3 results) No results for input(s): "PROBNP" in the last 8760 hours. HbA1C: No results for input(s): "HGBA1C" in the last 72 hours. CBG: No results for input(s): "GLUCAP" in the last 168 hours. Lipid Profile: Recent Labs    05/17/22 0447  CHOL 177  HDL 42  LDLCALC 117*  TRIG 92  CHOLHDL 4.2   Thyroid Function Tests: Recent Labs    05/17/22 0039  TSH 3.663   Anemia Panel: No results for input(s): "VITAMINB12", "FOLATE", "FERRITIN", "TIBC", "IRON", "RETICCTPCT" in the last 72 hours. Urine analysis:    Component Value Date/Time   COLORURINE AMBER (A) 05/17/2022 0208   APPEARANCEUR CLOUDY (A) 05/17/2022 0208   LABSPEC 1.018 05/17/2022 0208   PHURINE 5.0 05/17/2022 0208   GLUCOSEU NEGATIVE 05/17/2022 0208   HGBUR NEGATIVE 05/17/2022 0208   BILIRUBINUR SMALL (A) 05/17/2022 0208   KETONESUR 5 (A) 05/17/2022 0208   PROTEINUR 30 (A) 05/17/2022 0208   UROBILINOGEN 1.0 01/11/2010 1841   NITRITE NEGATIVE 05/17/2022 0208   LEUKOCYTESUR NEGATIVE 05/17/2022 0208   Sepsis Labs: @LABRCNTIP (procalcitonin:4,lacticidven:4)  ) Recent Results (from the past 240 hour(s))  SARS Coronavirus 2 by RT PCR (hospital order, performed in [facility] hospital lab) *cepheid single result test* Anterior Nasal Swab     Status: None   Collection Time: 05/17/22  5:00 AM    Specimen: Anterior Nasal Swab  Result Value Ref Range Status   SARS Coronavirus 2 by RT PCR NEGATIVE NEGATIVE Final    Comment: (NOTE) SARS-CoV-2 target nucleic acids are NOT DETECTED.  The SARS-CoV-2 RNA is generally detectable in upper and lower respiratory specimens during the acute phase of infection. The lowest concentration of SARS-CoV-2 viral copies this assay can detect is 250 copies / mL. A negative result does not preclude SARS-CoV-2 infection and should not be used as the sole basis for treatment or other patient management decisions.  A negative result may occur with improper specimen collection / handling, submission of specimen other than nasopharyngeal swab, presence of viral mutation(s) within the areas targeted by this assay, and inadequate number of viral copies (<250 copies / mL). A negative result must be combined with clinical observations, patient history, and epidemiological information.  Fact Sheet for Patients:   https://www.patel.info/  Fact Sheet for Healthcare Providers: https://hall.com/  This test is not yet approved or  cleared by the [place] FDA and has been authorized for detection and/or diagnosis of SARS-CoV-2 by FDA under an Emergency Use Authorization (EUA).  This EUA will remain in effect (meaning this test can be used) for the duration of the COVID-19 declaration under Section 564(b)(1) of the Act, 21 U.S.C. section 360bbb-3(b)(1), unless the authorization is terminated or revoked sooner.  Performed at [facility], [place] [address] [place], [place] [address]       Studies: ECHOCARDIOGRAM COMPLETE  Result Date: 05/17/2022    ECHOCARDIOGRAM REPORT   Patient Name:   Stephanie Benton Date of Exam: 05/17/2022 Medical Rec #:  [id number]      Height:       65.0 in Accession #:    [id number]     Weight:       227.0 lb Date of Birth:  [birth year]      BSA:          2.088 m Patient Age:    33  years       BP:           124/70 mmHg Patient Gender: F              HR:           90 bpm. Exam Location:  Inpatient Procedure: 2D Echo, Cardiac Doppler and Color Doppler Indications:    Chest Pain R07.9  History:        Patient has no prior history of Echocardiogram examinations.                 COPD, Signs/Symptoms:Chest Pain; Risk Factors:Hypertension and                 Current Smoker.  Sonographer:    Ronny Flurry Referring Phys: [id number] [place] [address]  1. Left ventricular ejection fraction, by estimation, is 60 to 65%. The left ventricle has normal function. The left ventricle has no regional wall motion abnormalities. There is mild left ventricular hypertrophy. Left ventricular diastolic parameters are consistent with Grade I diastolic dysfunction (impaired relaxation).  2. Right ventricular systolic function is normal. The right ventricular size is normal.  3. The mitral valve is normal in structure. No evidence of mitral valve regurgitation.  4. There is mild calcification of the aortic valve. Aortic valve regurgitation is not visualized.  5. The inferior vena cava is normal in size with greater than 50% respiratory variability, suggesting right atrial pressure of 3 mmHg. FINDINGS  Left Ventricle: Left ventricular ejection fraction, by estimation, is 60 to 65%. The left ventricle has normal function. The left ventricle has no regional wall motion abnormalities. The left ventricular internal cavity size was normal in size. There is  mild left ventricular hypertrophy. Left ventricular diastolic parameters are consistent with Grade I diastolic dysfunction (impaired relaxation). Right Ventricle: The right ventricular size is normal. No increase in right ventricular wall thickness. Right ventricular systolic function is normal. Left Atrium: Left atrial size was normal in size. Right Atrium: Right atrial size was normal in size. Pericardium: There is no evidence of pericardial effusion. Mitral  Valve: The mitral valve is normal in structure. No evidence of mitral valve regurgitation. Tricuspid Valve: The tricuspid valve is normal in structure. Tricuspid valve regurgitation is not demonstrated. Aortic Valve: There is mild calcification of the aortic valve. Aortic valve regurgitation is  not visualized. Aortic valve mean gradient measures 7.0 mmHg. Aortic valve peak gradient measures 13.0 mmHg. Aortic valve area, by VTI measures 2.79 cm. Pulmonic Valve: The pulmonic valve was normal in structure. Pulmonic valve regurgitation is not visualized. Aorta: The aortic root and ascending aorta are structurally normal, with no evidence of dilitation. Venous: The inferior vena cava is normal in size with greater than 50% respiratory variability, suggesting right atrial pressure of 3 mmHg. IAS/Shunts: No atrial level shunt detected by color flow Doppler.  LEFT VENTRICLE PLAX 2D LVIDd:         4.00 cm   Diastology LVIDs:         2.30 cm   LV e' medial:    7.07 cm/s LV PW:         1.10 cm   LV E/e' medial:  13.6 LV IVS:        1.00 cm   LV e' lateral:   9.25 cm/s LVOT diam:     2.05 cm   LV E/e' lateral: 10.4 LV SV:         79 LV SV Index:   38 LVOT Area:     3.30 cm  RIGHT VENTRICLE             IVC RV S prime:     14.90 cm/s  IVC diam: 1.70 cm TAPSE (M-mode): 2.4 cm LEFT ATRIUM             Index        RIGHT ATRIUM           Index LA diam:        3.80 cm 1.82 cm/m   RA Area:     13.70 cm LA Vol (A2C):   35.4 ml 16.96 ml/m  RA Volume:   31.70 ml  15.19 ml/m LA Vol (A4C):   58.5 ml 28.02 ml/m LA Biplane Vol: 45.6 ml 21.84 ml/m  AORTIC VALVE AV Area (Vmax):    2.79 cm AV Area (Vmean):   2.70 cm AV Area (VTI):     2.79 cm AV Vmax:           180.00 cm/s AV Vmean:          120.000 cm/s AV VTI:            0.284 m AV Peak Grad:      13.0 mmHg AV Mean Grad:      7.0 mmHg LVOT Vmax:         152.00 cm/s LVOT Vmean:        98.100 cm/s LVOT VTI:          0.240 m LVOT/AV VTI ratio: 0.85  AORTA Ao Root diam: 2.70 cm Ao Asc  diam:  3.30 cm MITRAL VALVE MV Area (PHT): 4.17 cm     SHUNTS MV Decel Time: 182 msec     Systemic VTI:  0.24 m MV E velocity: 96.10 cm/s   Systemic Diam: 2.05 cm MV A velocity: 127.00 cm/s MV E/A ratio:  0.76 Photographer signed by Carolan Clines Signature Date/Time: 05/17/2022/11:31:04 AM    Final    US Abdomen Limited RUQ (LIVER/GB)  Result Date: 05/16/2022 CLINICAL DATA:  Right upper quadrant pain, nausea/vomiting. EXAM: ULTRASOUND ABDOMEN LIMITED RIGHT UPPER QUADRANT COMPARISON:  05/09/2022. FINDINGS: Gallbladder: Stones and sludge are present within the gallbladder. No gallbladder wall thickening or pericholecystic fluid. A Murphy sign is noted by sonographer. Common bile duct: Diameter: 3.6 mm. Liver: No focal lesion  identified. Increased parenchymal echogenicity. Portal vein is patent on color Doppler imaging with normal direction of blood flow towards the liver. Other: No free fluid. IMPRESSION: 1. Cholelithiasis and sludge in the gallbladder with positive Murphy sign. No gallbladder wall thickening or pericholecystic edema. Clinical correlation is recommended to exclude acute cholecystitis. 2. Hepatic steatosis. Electronically Signed   By: Thornell Sartorius M.D.   On: 05/16/2022 22:12   DG Chest 1 View  Result Date: 05/16/2022 CLINICAL DATA:  Chest pain EXAM: CHEST  1 VIEW COMPARISON:  Chest 05/09/2022 FINDINGS: Heart size and vascularity normal. Lungs clear without infiltrate effusion. Moderate to large hiatal hernia. IMPRESSION: No active disease. Electronically Signed   By: Marlan Palau M.D.   On: 05/16/2022 16:21    Scheduled Meds:  [MAR Hold] latanoprost  1 drop Both Eyes QHS   [MAR Hold] pantoprazole (PROTONIX) IV  40 mg Intravenous Q12H   [MAR Hold] risperiDONE  1 mg Oral QHS   [MAR Hold] venlafaxine XR  37.5 mg Oral QHS    Continuous Infusions:  sodium chloride 125 mL/hr at 05/17/22 0744   lactated ringers 125 mL/hr at 05/16/22 2359     LOS: 0 days     Briant Cedar, MD [facility]  If 7PM-7AM, please contact night-coverage [email or web address] 05/17/2022, 2:45 PM

## 2022-05-17 NOTE — Assessment & Plan Note (Signed)
-   Continue home meds °

## 2022-05-17 NOTE — Evaluation (Signed)
Physical Therapy Evaluation Patient Details Name: Stephanie Benton MRN: [id number] DOB: Aug 25, 1950 Today's Date: 05/17/2022  History of Present Illness  Pt is a 71 y/o female admitted 05/17/22 with dehydration in setting of N&V and diarrhea for the past 2 weeks. PMH:  HTN, HLD, asthma, glaucoma, GERD  Clinical Impression  Pt admitted with above diagnosis. Reports multiple falls at home over the past few months - agreeable to home health PT follow-up at discharge. Patient was able to ambulate in room with RW safely at supervision level, no overt LOB, no dizziness, however HR elevated to 133 during ambulatory period. Resolves after sitting back down. Encouraged to use RW at home due to falls (usually uses [facility]). States family is available PRN to assist if needed. Pt currently with functional limitations due to the deficits listed below (see PT Problem List). Pt will benefit from skilled PT to increase their independence and safety with mobility to allow discharge to the venue listed below.          Recommendations for follow up therapy are one component of a multi-disciplinary discharge planning process, led by the attending physician.  Recommendations may be updated based on patient status, additional functional criteria and insurance authorization.  Follow Up Recommendations Home health PT      Assistance Recommended at Discharge Intermittent Supervision/Assistance  Patient can return home with the following  A little help with walking and/or transfers;A little help with bathing/dressing/bathroom;Assist for transportation    Equipment Recommendations None recommended by PT  Recommendations for Other Services       Functional Status Assessment Patient has had a recent decline in their functional status and demonstrates the ability to make significant improvements in function in a reasonable and predictable amount of time.     Precautions / Restrictions Precautions Precautions:  Fall Restrictions Weight Bearing Restrictions: No      Mobility  Bed Mobility Overal bed mobility: Needs Assistance Bed Mobility: Supine to Sit, Sit to Supine     Supine to sit: Min guard Sit to supine: Supervision   General bed mobility comments: Min guard to rise to EOB, effort but performed without physical assist. Cues throughout. Back into bed without assistance but requires extra time.    Transfers Overall transfer level: Needs assistance Equipment used: Rolling walker (2 wheels) Transfers: Sit to/from Stand Sit to Stand: Min guard           General transfer comment: for safety from high stretcher height    Ambulation/Gait Ambulation/Gait assistance: Supervision Gait Distance (Feet): 14 Feet Assistive device: Rolling walker (2 wheels) Gait Pattern/deviations: Step-through pattern, Decreased stride length Gait velocity: slow Gait velocity interpretation: <1.31 ft/sec, indicative of household ambulator Pre-gait activities: weight shifting and stationary march General Gait Details: Slow and guarded but with good stability while using RW for support. Cues for safety, managed lines/leads. Supervision for safety. HR elevated to 133 at max.  Stairs            Wheelchair Mobility    Modified Rankin (Stroke Patients Only)       Balance Overall balance assessment: Needs assistance Sitting-balance support: No upper extremity supported, Feet supported Sitting balance-Leahy Scale: Good     Standing balance support: No upper extremity supported Standing balance-Leahy Scale: Fair Standing balance comment: Stands without UE support statically                             Pertinent Vitals/Pain Pain Assessment Pain  Assessment: 0-10 Pain Location: BLE Pain Descriptors / Indicators: Grimacing, Guarding, Sharp Pain Intervention(s): Monitored during session, Limited activity within patient's tolerance    Home Living Family/patient expects to be  discharged to:: Private residence Living Arrangements: Alone Available Help at Discharge: Family;Available PRN/intermittently Type of Home: Apartment Home Access: Level entry       Home Layout: One level Home Equipment: Cane - quad;Shower seat;Educational psychologist (2 wheels)      Prior Function Prior Level of Function : Independent/Modified Independent             Mobility Comments: use of cane for mobility ADLs Comments: Modified Independent with ADLs, household IADLs. recently stopped driving - family recently asked her to stop driving. they assist with transportation needs now     Hand Dominance   Dominant Hand: Right    Extremity/Trunk Assessment   Upper Extremity Assessment Upper Extremity Assessment: Defer to OT evaluation    Lower Extremity Assessment Lower Extremity Assessment: Generalized weakness    Cervical / Trunk Assessment Cervical / Trunk Assessment: Normal  Communication   Communication: No difficulties  Cognition Arousal/Alertness: Awake/alert Behavior During Therapy: WFL for tasks assessed/performed Overall Cognitive Status: Within Functional Limits for tasks assessed                                          General Comments General comments (skin integrity, edema, etc.): HR to 133bpm ambulating but otherwise wnl    Exercises     Assessment/Plan    PT Assessment Patient needs continued PT services  PT Problem List Decreased strength;Decreased activity tolerance;Decreased balance;Decreased mobility;Decreased knowledge of use of DME;Pain       PT Treatment Interventions DME instruction;Gait training;Functional mobility training;Therapeutic activities;Therapeutic exercise;Balance training;Neuromuscular re-education;Patient/family education    PT Goals (Current goals can be found in the Care Plan section)  Acute Rehab PT Goals Patient Stated Goal: get well PT Goal Formulation: With patient Time For Goal  Achievement: 05/24/22 Potential to Achieve Goals: Good    Frequency Min 3X/week     Co-evaluation               AM-PAC PT "6 Clicks" Mobility  Outcome Measure Help needed turning from your back to your side while in a flat bed without using bedrails?: None Help needed moving from lying on your back to sitting on the side of a flat bed without using bedrails?: A Little Help needed moving to and from a bed to a chair (including a wheelchair)?: A Little Help needed standing up from a chair using your arms (e.g., wheelchair or bedside chair)?: A Little Help needed to walk in hospital room?: A Little Help needed climbing 3-5 steps with a railing? : A Little 6 Click Score: 19    End of Session Equipment Utilized During Treatment: Gait belt Activity Tolerance: Patient tolerated treatment well Patient left: in bed;with call bell/phone within reach;with nursing/sitter in room Nurse Communication: Mobility status PT Visit Diagnosis: Unsteadiness on feet (R26.81);Other abnormalities of gait and mobility (R26.89);Repeated falls (R29.6);History of falling (Z91.81);Muscle weakness (generalized) (M62.81);Difficulty in walking, not elsewhere classified (R26.2);Pain Pain - part of body: Leg    Time: 1139-1208 PT Time Calculation (min) (ACUTE ONLY): 29 min   Charges:   PT Evaluation $PT Eval Low Complexity: 1 Low PT Treatments $Therapeutic Activity: 8-22 mins        Kathlyn Sacramento, PT, DPT Physical Therapist  [place]   Ellouise Newer 05/17/2022, 12:30 PM

## 2022-05-18 DIAGNOSIS — I10 Essential (primary) hypertension: Secondary | ICD-10-CM | POA: Diagnosis not present

## 2022-05-18 DIAGNOSIS — E860 Dehydration: Secondary | ICD-10-CM | POA: Diagnosis not present

## 2022-05-18 DIAGNOSIS — R7989 Other specified abnormal findings of blood chemistry: Secondary | ICD-10-CM

## 2022-05-18 DIAGNOSIS — G8929 Other chronic pain: Secondary | ICD-10-CM

## 2022-05-18 DIAGNOSIS — F3178 Bipolar disorder, in full remission, most recent episode mixed: Secondary | ICD-10-CM | POA: Diagnosis not present

## 2022-05-18 DIAGNOSIS — K859 Acute pancreatitis without necrosis or infection, unspecified: Secondary | ICD-10-CM | POA: Diagnosis not present

## 2022-05-18 LAB — COMPREHENSIVE METABOLIC PANEL
ALT: 15 U/L (ref 0–44)
AST: 20 U/L (ref 15–41)
Albumin: 2.9 g/dL — ABNORMAL LOW (ref 3.5–5.0)
Alkaline Phosphatase: 65 U/L (ref 38–126)
Anion gap: 13 (ref 5–15)
BUN: 13 mg/dL (ref 8–23)
CO2: 27 mmol/L (ref 22–32)
Calcium: 8.7 mg/dL — ABNORMAL LOW (ref 8.9–10.3)
Chloride: 96 mmol/L — ABNORMAL LOW (ref 98–111)
Creatinine, Ser: 1.24 mg/dL — ABNORMAL HIGH (ref 0.44–1.00)
GFR, Estimated: 46 mL/min — ABNORMAL LOW (ref >60)
Glucose, Bld: 75 mg/dL (ref 70–99)
Potassium: 2.9 mmol/L — ABNORMAL LOW (ref 3.5–5.1)
Sodium: 136 mmol/L (ref 135–145)
Total Bilirubin: 1.3 mg/dL — ABNORMAL HIGH (ref 0.3–1.2)
Total Protein: 5.3 g/dL — ABNORMAL LOW (ref 6.5–8.1)

## 2022-05-18 LAB — CBC WITH DIFFERENTIAL/PLATELET
Abs Immature Granulocytes: 0.03 10*3/uL (ref 0.00–0.07)
Basophils Absolute: 0 10*3/uL (ref 0.0–0.1)
Basophils Relative: 1 %
Eosinophils Absolute: 0.1 10*3/uL (ref 0.0–0.5)
Eosinophils Relative: 2 %
HCT: 31.3 % — ABNORMAL LOW (ref 36.0–46.0)
Hemoglobin: 10.7 g/dL — ABNORMAL LOW (ref 12.0–15.0)
Immature Granulocytes: 1 %
Lymphocytes Relative: 17 %
Lymphs Abs: 0.8 10*3/uL (ref 0.7–4.0)
MCH: 27 pg (ref 26.0–34.0)
MCHC: 34.2 g/dL (ref 30.0–36.0)
MCV: 78.8 fL — ABNORMAL LOW (ref 80.0–100.0)
Monocytes Absolute: 0.4 10*3/uL (ref 0.1–1.0)
Monocytes Relative: 9 %
Neutro Abs: 3.4 10*3/uL (ref 1.7–7.7)
Neutrophils Relative %: 70 %
Platelets: 199 10*3/uL (ref 150–400)
RBC: 3.97 MIL/uL (ref 3.87–5.11)
RDW: 14.5 % (ref 11.5–15.5)
WBC: 4.7 10*3/uL (ref 4.0–10.5)
nRBC: 0 % (ref 0.0–0.2)

## 2022-05-18 LAB — LIPASE, BLOOD: Lipase: 155 U/L — ABNORMAL HIGH (ref 11–51)

## 2022-05-18 LAB — MAGNESIUM: Magnesium: 1.8 mg/dL (ref 1.7–2.4)

## 2022-05-18 MED ORDER — POTASSIUM CHLORIDE CRYS ER 20 MEQ PO TBCR
40.0000 meq | EXTENDED_RELEASE_TABLET | Freq: Once | ORAL | Status: AC
  Administered 2022-05-18: 40 meq via ORAL
  Filled 2022-05-18: qty 2

## 2022-05-18 MED ORDER — ONDANSETRON HCL 4 MG/2ML IJ SOLN
4.0000 mg | Freq: Four times a day (QID) | INTRAMUSCULAR | Status: DC | PRN
  Administered 2022-05-18 – 2022-05-22 (×3): 4 mg via INTRAVENOUS
  Filled 2022-05-18 (×3): qty 2

## 2022-05-18 MED ORDER — TRAMADOL HCL 50 MG PO TABS
50.0000 mg | ORAL_TABLET | Freq: Three times a day (TID) | ORAL | Status: DC | PRN
  Administered 2022-05-19 (×2): 50 mg via ORAL
  Filled 2022-05-18 (×2): qty 1

## 2022-05-18 NOTE — Progress Notes (Signed)
PROGRESS NOTE  Stephanie Benton OYD:[id number] DOB: [birth year] DOA: 05/16/2022 PCP: Sharmon Revere, MD  HPI/Recap of past 24 hours: Stephanie Benton is a 71 y.o. female with medical history significant of HTN, HLD, asthma, glaucoma, GERD, presented with intermittent, worsening nausea, vomiting, and epigastric pain on going for the past 2 weeks. Pt was seen about a week ago of which CTA of her chest and CT of abdomen was unremarkable, noted elevated lipase. Pt was discharged, but never got better. Pt went to her primary care and was told to go to ED due to abnormal labs. Patient does use BC powders daily, denies any other NSAID use.  In the ED, lipase mildly elevated, ultrasound showed positive Murphy sign, cholelithiasis, but no gallbladder wall thickening or pericholecystic edema.  General surgery and GI consulted.  Patient admitted for further management.   Today, patient reports persistent nausea, with some epigastric tenderness.  Reports chronic lower extremity pain that has been ongoing since she was a child.  The reason why she was taking BC powder.   Assessment/Plan: Principal Problem:   Dehydration Active Problems:   Hypertension   Bipolar disorder, in full remission, most recent episode mixed (HCC)   Pancreatitis    Intractable nausea/vomiting with epigastric pain Bleeding esophagitis with some gastritis Currently afebrile, with no leukocytosis Mildly elevated lipase, elevated bilirubin CT A/P unremarkable, RUQ USS showed cholelithiasis, but no gallbladder wall thickening or pericholecystic edema GI on board, plan for EGD which showed LA grade B esophagitis with bleeding, gastritis, erythematous shows duodenopathy.  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   Weight as of this encounter: 83.7 kg.     Code Status: Full  Family Communication: None at bedside  Disposition Plan: Status is: Inpatient The patient will require care spanning > 2 midnights and should be moved to inpatient because: Level of care      Consultants: GI General surgery  Procedures: EGD  Antimicrobials: None  DVT prophylaxis: SCD   Objective: Vitals:   05/18/22 0305 05/18/22 0804 05/18/22 1229 05/18/22 1614  BP:  121/64 (!) 140/67 133/77  Pulse:  80 91 90  Resp: 18 18 17 20   Temp: 99.3 F (37.4 C) 99.2 F (37.3 C) 99 F (37.2 C) 98.7 F (37.1 C)  TempSrc: Oral Oral Oral Oral  SpO2:  98% 99% 98%  Weight:      Height:        Intake/Output Summary (Last 24 hours) at 05/18/2022 1642 Last data filed at 05/18/2022 0319 Gross per 24 hour  Intake 3580.76 ml  Output --  Net 3580.76 ml   Filed Weights   05/17/22 1444 05/17/22 1656  Weight: 103 kg 83.7 kg    Exam: General: NAD  Cardiovascular: S1, S2 present Respiratory: CTAB Abdomen: Soft,  nontender, nondistended, bowel sounds present Musculoskeletal: trace bilateral pedal edema noted Skin: Normal Psychiatry: Normal mood     Data Reviewed: CBC: Recent Labs  Lab 05/16/22 1516 05/17/22 0043 05/17/22 0447 05/18/22 0147  WBC 7.1  --  8.2 4.7  NEUTROABS 5.2  --   --  3.4  HGB 14.4 12.9 11.7* 10.7*  HCT 41.8 38.0 34.8* 31.3*  MCV 76.8*  --  78.0* 78.8*  PLT  299  --  241 199   Basic Metabolic Panel: Recent Labs  Lab 05/16/22 1516 05/17/22 0039 05/17/22 0043 05/17/22 0447 05/18/22 0147  NA 135 137 132* 134* 136  K 3.0* 3.5 3.5 3.0* 2.9*  CL 87* 88*  --  92* 96*  CO2 21* 29  --  24 27  GLUCOSE 107* 92  --  87 75  BUN 21 23  --  22 13  CREATININE 2.05* 2.05*  --  1.84* 1.24*  CALCIUM 10.1 9.3  --  9.0 8.7*  MG  --  1.4*  --  1.5* 1.8  PHOS  --  3.7  --  3.2  --    GFR: Estimated Creatinine Clearance: 43.8 mL/min (A) (by C-G formula based on SCr of 1.24 mg/dL (H)). Liver Function Tests: Recent Labs  Lab 05/16/22 1516 05/17/22 0447 05/18/22 0147  AST 39 27 20  ALT 25 17 15   ALKPHOS 94 74 65  BILITOT 2.0* 1.4* 1.3*  PROT 7.2 6.0* 5.3*  ALBUMIN 3.9 3.2* 2.9*   Recent Labs  Lab 05/16/22 1516 05/17/22 0447 05/18/22 0147  LIPASE 309* 210* 155*   No results for input(s): "AMMONIA" in the last 168 hours. Coagulation Profile: Recent Labs  Lab 05/17/22 0039  INR 1.1   Cardiac Enzymes: Recent Labs  Lab 05/17/22 0039  CKTOTAL 138   BNP (last 3 results) No results for input(s): "PROBNP" in the last 8760 hours. HbA1C: No results for input(s): "HGBA1C" in the last 72 hours. CBG: No results for input(s): "GLUCAP" in the last 168 hours. Lipid Profile: Recent Labs    05/17/22 0447  CHOL 177  HDL 42  LDLCALC 117*  TRIG 92  CHOLHDL 4.2   Thyroid Function Tests: Recent Labs    05/17/22 0039  TSH 3.663   Anemia Panel: No results for input(s): "VITAMINB12", "FOLATE", "FERRITIN", "TIBC", "IRON", "RETICCTPCT" in the last 72 hours. Urine analysis:    Component Value Date/Time   COLORURINE AMBER (A) 05/17/2022 0208   APPEARANCEUR CLOUDY (A) 05/17/2022 0208   LABSPEC 1.018 05/17/2022 0208   PHURINE 5.0 05/17/2022 0208   GLUCOSEU NEGATIVE 05/17/2022 0208   HGBUR NEGATIVE 05/17/2022 0208   BILIRUBINUR SMALL (A) 05/17/2022 0208   KETONESUR 5 (A) 05/17/2022 0208   PROTEINUR 30 (A) 05/17/2022 0208   UROBILINOGEN 1.0  01/11/2010 1841   NITRITE NEGATIVE 05/17/2022 0208   LEUKOCYTESUR NEGATIVE 05/17/2022 0208   Sepsis Labs: @LABRCNTIP (procalcitonin:4,lacticidven:4)  ) Recent Results (from the past 240 hour(s))  SARS Coronavirus 2 by RT PCR (hospital order, performed in [facility] hospital lab) *cepheid single result test* Anterior Nasal Swab     Status: None   Collection Time: 05/17/22  5:00 AM   Specimen: Anterior Nasal Swab  Result Value Ref Range Status   SARS Coronavirus 2 by RT PCR NEGATIVE NEGATIVE Final    Comment: (NOTE) SARS-CoV-2 target nucleic acids are NOT DETECTED.  The SARS-CoV-2 RNA is generally detectable in upper and lower respiratory specimens during the acute phase of infection. The lowest concentration of SARS-CoV-2 viral  copies this assay can detect is 250 copies / mL. A negative result does not preclude SARS-CoV-2 infection and should not be used as the sole basis for treatment or other patient management decisions.  A negative result may occur with improper specimen collection / handling, submission of specimen other than nasopharyngeal swab, presence of viral mutation(s) within the areas targeted by this assay, and inadequate number of viral copies (<250 copies / mL). A negative result must be combined with clinical observations, patient history, and epidemiological information.  Fact Sheet for Patients:   https://www.patel.info/  Fact Sheet for Healthcare Providers: https://hall.com/  This test is not yet approved or  cleared by the [place] FDA and has been authorized for detection and/or diagnosis of SARS-CoV-2 by FDA under an Emergency Use Authorization (EUA).  This EUA will remain in effect (meaning this test can be used) for the duration of the COVID-19 declaration under Section 564(b)(1) of the Act, 21 U.S.C. section 360bbb-3(b)(1), unless the authorization is terminated or revoked sooner.  Performed at [facility], [place] [address] [place], [place] [address]       Studies: No results found.  Scheduled Meds:  latanoprost  1 drop Both Eyes QHS   pantoprazole (PROTONIX) IV  40 mg Intravenous Q12H   risperiDONE  1 mg Oral QHS   sucralfate  1 g Oral TID WC & HS   venlafaxine XR  37.5 mg Oral QHS    Continuous Infusions:  lactated ringers Stopped (05/17/22 1451)     LOS: 1 day     Alma Friendly, MD [facility]  If 7PM-7AM, please contact night-coverage [email or web address] 05/18/2022, 4:42 PM

## 2022-05-18 NOTE — Progress Notes (Signed)
GI findings on upper endoscopy noted. No surgery indicated at this time. We will sign off. Please reconsult if needed.

## 2022-05-18 NOTE — Progress Notes (Signed)
Mobility Specialist - Progress Note   05/18/22 1611  Mobility  Activity Stood at bedside (Sit to stand x2)  Level of Assistance Standby assist, set-up cues, supervision of patient - no hands on  Assistive Device Front wheel walker  Distance Ambulated (ft) 0 ft  Activity Response Tolerated well  $Mobility charge 1 Mobility    Pt received in bed agreeable to mobility. Declined further mobility d/t LE pain. Left in bed w/ call bell in reach and all needs met.   [name] Mobility Specialist

## 2022-05-18 NOTE — Progress Notes (Addendum)
Daily Progress Note (Cross coverage for Dr. Elnoria Howard)   Hospital Day: 3  Chief Complaint:  abdominal pain .   Brief History 71 yo female with HTN, HLD, cholelithiasis, history of ventral hernia repair. Seen in consultation by GI on 10/13 for upper abdominal pain, nausea and vomiting over the last few weeks. Workup showed elevated lipase, mild elevation in tbili, cholelithiasis.   Assessment / Plan   # 71 yo female with nausea, vomiting and upper abdominal pain of unclear etiology. She has cholelithiasis without evidence for cholecystitis. Her lipase has been elevated for several weeks ( up to 6x ULN) without evidence for pancreatitis on CT scan. Yesterday she had an EGD with didn't show any obvious reason for her symptoms. She has a chronic fluid collection associated with a hernia repair that she had years ago, it may be slightly larger on CT scan but doubt this is source of her pain.  She is tender in LUQ today,especially over this mass like area felt on exam ( ? th chronic fluid collection seen on CT scan). She has upper discomfort and nausea with eating.  Going to stop Carafate, it hasn't helped and she has renal disease.  Will ask nursing to weigh her again. She was 227 poiunds on 10/5 down to 184 pounds now. She likely has had weight loss but this is more than expected.  General Surgery will consider cholecystectomy if GI evaluation is negative . Our workup is still in progress  # Gastritis, biopsies pending    # Mild esophagitis, biopsies pending Continue BID PPI for now.   # Hypokalemia  Subjective   Still having mid upper abdominal pain and nausea, especially meal related.   Objective   Endoscopic studies:  EGD - LA Grade B esophagitis with bleeding. Biopsied. - 4 cm hiatal hernia. - Gastritis. Biopsied. - Erythematous duodenopathy.   Imaging:  ECHOCARDIOGRAM COMPLETE  Result Date: 05/17/2022    ECHOCARDIOGRAM REPORT   Patient Name:   Stephanie Benton Date of  Exam: 05/17/2022 Medical Rec #:  [id number]      Height:       65.0 in Accession #:    [id number]     Weight:       227.0 lb Date of Birth:  05-26-1951      BSA:          2.088 m Patient Age:    71 years       BP:           124/70 mmHg Patient Gender: F              HR:           90 bpm. Exam Location:  Inpatient Procedure: 2D Echo, Cardiac Doppler and Color Doppler Indications:    Chest Pain R07.9  History:        Patient has no prior history of Echocardiogram examinations.                 COPD, Signs/Symptoms:Chest Pain; Risk Factors:Hypertension and                 Current Smoker.  Sonographer:    Lucendia Herrlich Referring Phys: [id number] [name] DOUTOVA IMPRESSIONS  1. Left ventricular ejection fraction, by estimation, is 60 to 65%. The left ventricle has normal function. The left ventricle has no regional wall motion abnormalities. There is mild left ventricular hypertrophy. Left ventricular diastolic parameters are consistent with Grade I diastolic dysfunction (impaired relaxation).  2. Right ventricular systolic function is normal. The right ventricular size is normal.  3. The mitral valve is normal in structure. No evidence of mitral valve regurgitation.  4. There is mild calcification of the aortic valve. Aortic valve regurgitation is not visualized.  5. The inferior vena cava is normal in size with greater than 50% respiratory variability, suggesting right atrial pressure of 3 mmHg. FINDINGS  Left Ventricle: Left ventricular ejection fraction, by estimation, is 60 to 65%. The left ventricle has normal function. The left ventricle has no regional wall motion abnormalities. The left ventricular internal cavity size was normal in size. There is  mild left ventricular hypertrophy. Left ventricular diastolic parameters are consistent with Grade I diastolic dysfunction (impaired relaxation). Right Ventricle: The right ventricular size is normal. No increase in right ventricular wall thickness. Right ventricular  systolic function is normal. Left Atrium: Left atrial size was normal in size. Right Atrium: Right atrial size was normal in size. Pericardium: There is no evidence of pericardial effusion. Mitral Valve: The mitral valve is normal in structure. No evidence of mitral valve regurgitation. Tricuspid Valve: The tricuspid valve is normal in structure. Tricuspid valve regurgitation is not demonstrated. Aortic Valve: There is mild calcification of the aortic valve. Aortic valve regurgitation is not visualized. Aortic valve mean gradient measures 7.0 mmHg. Aortic valve peak gradient measures 13.0 mmHg. Aortic valve area, by VTI measures 2.79 cm. Pulmonic Valve: The pulmonic valve was normal in structure. Pulmonic valve regurgitation is not visualized. Aorta: The aortic root and ascending aorta are structurally normal, with no evidence of dilitation. Venous: The inferior vena cava is normal in size with greater than 50% respiratory variability, suggesting right atrial pressure of 3 mmHg. IAS/Shunts: No atrial level shunt detected by color flow Doppler.  LEFT VENTRICLE PLAX 2D LVIDd:         4.00 cm   Diastology LVIDs:         2.30 cm   LV e' medial:    7.07 cm/s LV PW:         1.10 cm   LV E/e' medial:  13.6 LV IVS:        1.00 cm   LV e' lateral:   9.25 cm/s LVOT diam:     2.05 cm   LV E/e' lateral: 10.4 LV SV:         79 LV SV Index:   38 LVOT Area:     3.30 cm  RIGHT VENTRICLE             IVC RV S prime:     14.90 cm/s  IVC diam: 1.70 cm TAPSE (M-mode): 2.4 cm LEFT ATRIUM             Index        RIGHT ATRIUM           Index LA diam:        3.80 cm 1.82 cm/m   RA Area:     13.70 cm LA Vol (A2C):   35.4 ml 16.96 ml/m  RA Volume:   31.70 ml  15.19 ml/m LA Vol (A4C):   58.5 ml 28.02 ml/m LA Biplane Vol: 45.6 ml 21.84 ml/m  AORTIC VALVE AV Area (Vmax):    2.79 cm AV Area (Vmean):   2.70 cm AV Area (VTI):     2.79 cm AV Vmax:           180.00 cm/s AV Vmean:  120.000 cm/s AV VTI:            0.284 m AV Peak  Grad:      13.0 mmHg AV Mean Grad:      7.0 mmHg LVOT Vmax:         152.00 cm/s LVOT Vmean:        98.100 cm/s LVOT VTI:          0.240 m LVOT/AV VTI ratio: 0.85  AORTA Ao Root diam: 2.70 cm Ao Asc diam:  3.30 cm MITRAL VALVE MV Area (PHT): 4.17 cm     SHUNTS MV Decel Time: 182 msec     Systemic VTI:  0.24 m MV E velocity: 96.10 cm/s   Systemic Diam: 2.05 cm MV A velocity: 127.00 cm/s MV E/A ratio:  0.76 [place] signed by Carolan Clines Signature Date/Time: 05/17/2022/11:31:04 AM    Final    US Abdomen Limited RUQ (LIVER/GB)  Result Date: 05/16/2022 CLINICAL DATA:  Right upper quadrant pain, nausea/vomiting. EXAM: ULTRASOUND ABDOMEN LIMITED RIGHT UPPER QUADRANT COMPARISON:  05/09/2022. FINDINGS: Gallbladder: Stones and sludge are present within the gallbladder. No gallbladder wall thickening or pericholecystic fluid. A Murphy sign is noted by sonographer. Common bile duct: Diameter: 3.6 mm. Liver: No focal lesion identified. Increased parenchymal echogenicity. Portal vein is patent on color Doppler imaging with normal direction of blood flow towards the liver. Other: No free fluid. IMPRESSION: 1. Cholelithiasis and sludge in the gallbladder with positive Murphy sign. No gallbladder wall thickening or pericholecystic edema. Clinical correlation is recommended to exclude acute cholecystitis. 2. Hepatic steatosis. Electronically Signed   By: Thornell Sartorius M.D.   On: 05/16/2022 22:12   DG Chest 1 View  Result Date: 05/16/2022 CLINICAL DATA:  Chest pain EXAM: CHEST  1 VIEW COMPARISON:  Chest 05/09/2022 FINDINGS: Heart size and vascularity normal. Lungs clear without infiltrate effusion. Moderate to large hiatal hernia. IMPRESSION: No active disease. Electronically Signed   By: Marlan Palau M.D.   On: 05/16/2022 16:21    Lab Results: Recent Labs    05/16/22 1516 05/17/22 0043 05/17/22 0447 05/18/22 0147  WBC 7.1  --  8.2 4.7  HGB 14.4 12.9 11.7* 10.7*  HCT 41.8 38.0 34.8* 31.3*   PLT 299  --  241 199   BMET Recent Labs    05/17/22 0039 05/17/22 0043 05/17/22 0447 05/18/22 0147  NA 137 132* 134* 136  K 3.5 3.5 3.0* 2.9*  CL 88*  --  92* 96*  CO2 29  --  24 27  GLUCOSE 92  --  87 75  BUN 23  --  22 13  CREATININE 2.05*  --  1.84* 1.24*  CALCIUM 9.3  --  9.0 8.7*   LFT Recent Labs    05/18/22 0147  PROT 5.3*  ALBUMIN 2.9*  AST 20  ALT 15  ALKPHOS 65  BILITOT 1.3*   PT/INR Recent Labs    05/17/22 0039  LABPROT 14.2  INR 1.1     Scheduled inpatient medications:   latanoprost  1 drop Both Eyes QHS   pantoprazole (PROTONIX) IV  40 mg Intravenous Q12H   risperiDONE  1 mg Oral QHS   sucralfate  1 g Oral TID WC & HS   venlafaxine XR  37.5 mg Oral QHS   Continuous inpatient infusions:   lactated ringers Stopped (05/17/22 1451)   PRN inpatient medications: acetaminophen **OR** acetaminophen, albuterol, HYDROcodone-acetaminophen, HYDROmorphone (DILAUDID) injection, ondansetron (ZOFRAN) IV  Vital signs in last 24 hours:  Temp:  [98.2 F (36.8 C)-99.3 F (37.4 C)] 98.7 F (37.1 C) (10/14 1614) Pulse Rate:  [75-91] 90 (10/14 1614) Resp:  [15-20] 20 (10/14 1614) BP: (114-140)/(57-79) 133/77 (10/14 1614) SpO2:  [95 %-99 %] 98 % (10/14 1614) Weight:  [83.7 kg] 83.7 kg (10/13 1656)    Intake/Output Summary (Last 24 hours) at 05/18/2022 1631 Last data filed at 05/18/2022 0319 Gross per 24 hour  Intake 3580.76 ml  Output --  Net 3580.76 ml    Intake/Output from previous day: 10/13 0701 - 10/14 0700 In: 3830.8 [P.O.:240; I.V.:3590.8] Out: 0  Intake/Output this shift: No intake/output data recorded.   Physical Exam:  General: Alert female in NAD Heart:  Regular rate and rhythm.  Pulmonary: Normal respiratory effort Abdomen: Soft, moderate epigastric tenderness / LUQ tenderness. In the LUQ there is a large firm, tender area . Normal bowel sounds.  Neurologic: Alert and oriented Psych: Pleasant. Cooperative.    Principal  Problem:   Dehydration Active Problems:   Chest pain   Hypertension   Bipolar disorder, in full remission, most recent episode mixed (HCC)   Pancreatitis   Metabolic acidosis   Esophagitis determined by endoscopy     LOS: 1 day   Tye Savoy ,NP 05/18/2022, 4:31 PM   I have taken an interval history, thoroughly reviewed the chart and examined the patient. I agree with the Advanced Practitioner's note, impression and recommendations, and have recorded additional findings, impressions and recommendations below. I performed a substantive portion of this encounter (>50% time spent), including a complete performance of the medical decision making.  My additional thoughts are as follows:  Signout received from Dr. Benson Norway, extensive chart review performed, patient seen and examined. Several weeks of upper abdominal pain with food avoidance and weight loss, mildly elevated bilirubin and fairly elevated lipase. She also has a chronically tender seroma in the mid abdominal wall above the umbilicus that is firm and quite tender.  However, this is only mildly enlarged from previously and I think that is not the main thing that has been bothering her lately.  She is admittedly a challenging historian, but seems to clearly point to the epigastrium as the primary place of pain over the last few weeks, though the pain also radiates to the left and right upper quadrant.  Eating makes it worse and leads to nausea with food avoidance.  Overall, I think this is probably biliary colic.  I understand why our surgical consultants are less convinced given the somewhat unclear history and the lab abnormalities. No evidence of cholecystitis on imaging, no biliary ductal dilatation. Nevertheless, I wonder if she may have a small common bile duct stone or more subtle pancreatic abnormality.  I am ordering an MRI abdomen/MRCP, and Dr. Benson Norway will return to reevaluate this patient on Monday.  Nelida Meuse  III Office:[phone]

## 2022-05-19 ENCOUNTER — Inpatient Hospital Stay (HOSPITAL_COMMUNITY): Payer: Medicare Other

## 2022-05-19 DIAGNOSIS — K209 Esophagitis, unspecified without bleeding: Secondary | ICD-10-CM

## 2022-05-19 DIAGNOSIS — F3178 Bipolar disorder, in full remission, most recent episode mixed: Secondary | ICD-10-CM | POA: Diagnosis not present

## 2022-05-19 DIAGNOSIS — R1013 Epigastric pain: Secondary | ICD-10-CM

## 2022-05-19 DIAGNOSIS — G8929 Other chronic pain: Secondary | ICD-10-CM

## 2022-05-19 DIAGNOSIS — E860 Dehydration: Secondary | ICD-10-CM | POA: Diagnosis not present

## 2022-05-19 LAB — COMPREHENSIVE METABOLIC PANEL
ALT: 15 U/L (ref 0–44)
AST: 18 U/L (ref 15–41)
Albumin: 2.8 g/dL — ABNORMAL LOW (ref 3.5–5.0)
Alkaline Phosphatase: 60 U/L (ref 38–126)
Anion gap: 10 (ref 5–15)
BUN: 7 mg/dL — ABNORMAL LOW (ref 8–23)
CO2: 26 mmol/L (ref 22–32)
Calcium: 8.9 mg/dL (ref 8.9–10.3)
Chloride: 99 mmol/L (ref 98–111)
Creatinine, Ser: 1.05 mg/dL — ABNORMAL HIGH (ref 0.44–1.00)
GFR, Estimated: 56 mL/min — ABNORMAL LOW (ref >60)
Glucose, Bld: 63 mg/dL — ABNORMAL LOW (ref 70–99)
Potassium: 3.1 mmol/L — ABNORMAL LOW (ref 3.5–5.1)
Sodium: 135 mmol/L (ref 135–145)
Total Bilirubin: 1.2 mg/dL (ref 0.3–1.2)
Total Protein: 5 g/dL — ABNORMAL LOW (ref 6.5–8.1)

## 2022-05-19 LAB — CBC WITH DIFFERENTIAL/PLATELET
Abs Immature Granulocytes: 0.01 10*3/uL (ref 0.00–0.07)
Basophils Absolute: 0 10*3/uL (ref 0.0–0.1)
Basophils Relative: 1 %
Eosinophils Absolute: 0.2 10*3/uL (ref 0.0–0.5)
Eosinophils Relative: 4 %
HCT: 30.3 % — ABNORMAL LOW (ref 36.0–46.0)
Hemoglobin: 10 g/dL — ABNORMAL LOW (ref 12.0–15.0)
Immature Granulocytes: 0 %
Lymphocytes Relative: 22 %
Lymphs Abs: 1 10*3/uL (ref 0.7–4.0)
MCH: 26.2 pg (ref 26.0–34.0)
MCHC: 33 g/dL (ref 30.0–36.0)
MCV: 79.5 fL — ABNORMAL LOW (ref 80.0–100.0)
Monocytes Absolute: 0.4 10*3/uL (ref 0.1–1.0)
Monocytes Relative: 10 %
Neutro Abs: 2.8 10*3/uL (ref 1.7–7.7)
Neutrophils Relative %: 63 %
Platelets: 194 10*3/uL (ref 150–400)
RBC: 3.81 MIL/uL — ABNORMAL LOW (ref 3.87–5.11)
RDW: 14.4 % (ref 11.5–15.5)
WBC: 4.4 10*3/uL (ref 4.0–10.5)
nRBC: 0 % (ref 0.0–0.2)

## 2022-05-19 LAB — LIPASE, BLOOD: Lipase: 177 U/L — ABNORMAL HIGH (ref 11–51)

## 2022-05-19 MED ORDER — POTASSIUM CHLORIDE CRYS ER 20 MEQ PO TBCR
40.0000 meq | EXTENDED_RELEASE_TABLET | Freq: Two times a day (BID) | ORAL | Status: AC
  Administered 2022-05-19 (×2): 40 meq via ORAL
  Filled 2022-05-19 (×2): qty 2

## 2022-05-19 MED ORDER — OXYCODONE HCL 5 MG PO TABS: 5.0000 mg | ORAL_TABLET | Freq: Four times a day (QID) | ORAL | Status: DC | PRN

## 2022-05-19 MED ORDER — BOOST / RESOURCE BREEZE PO LIQD CUSTOM
1.0000 | Freq: Three times a day (TID) | ORAL | Status: DC
  Administered 2022-05-20 – 2022-05-22 (×3): 1 via ORAL

## 2022-05-19 MED ORDER — TRAMADOL HCL 50 MG PO TABS
50.0000 mg | ORAL_TABLET | Freq: Four times a day (QID) | ORAL | Status: DC | PRN
  Administered 2022-05-19 – 2022-05-22 (×7): 50 mg via ORAL
  Filled 2022-05-19 (×7): qty 1

## 2022-05-19 MED ORDER — DIPHENHYDRAMINE HCL 50 MG/ML IJ SOLN
12.5000 mg | Freq: Three times a day (TID) | INTRAMUSCULAR | Status: AC | PRN
  Administered 2022-05-19: 12.5 mg via INTRAVENOUS
  Filled 2022-05-19: qty 1

## 2022-05-19 MED ORDER — GADOBUTROL 1 MMOL/ML IV SOLN
9.0000 mL | Freq: Once | INTRAVENOUS | Status: AC | PRN
  Administered 2022-05-19: 9 mL via INTRAVENOUS

## 2022-05-19 MED ORDER — ADULT MULTIVITAMIN W/MINERALS CH
1.0000 | ORAL_TABLET | Freq: Every day | ORAL | Status: DC
  Administered 2022-05-19 – 2022-05-21 (×3): 1 via ORAL
  Filled 2022-05-19 (×4): qty 1

## 2022-05-19 NOTE — Progress Notes (Signed)
Initial Nutrition Assessment RD working remotely.   DOCUMENTATION CODES:   Not applicable  INTERVENTION:  - ordered Boost Breeze TID, each supplement provides 250 kcal and 9 grams of protein.  - ordered 1 tablet multivitamin with minerals/day.  - complete NFPE when feasible.   NUTRITION DIAGNOSIS:   Increased nutrient needs related to acute illness as evidenced by estimated needs.  GOAL:   Patient will meet greater than or equal to 90% of their needs  MONITOR:   PO intake, Supplement acceptance, Labs, Weight trends  REASON FOR ASSESSMENT:   Consult Assessment of nutrition requirement/status  ASSESSMENT:   71 y.o. female with medical history of HTN, HLD, asthma, glaucoma, and GERD. She presented to the ED with intermittent, worsening N/V and abdominal pain x2 weeks. She was evaluated 1 week ago at which time CT angio chest and CT abdomen were unremarkable; she was noted to have elevated lipase. In the ED, ultrasound showed cholelithiasis. General Surgery and GI consulted.  Diet advanced from NPO to Regular on 1/13 afternoon; no meal completion percentages documented in flow sheet.  Patient has not been seen by a [place] RD at any time in the past. She is currently noted to be out of the room to MRI.  Weight yesterday was 187 lb, weight on 10/13 was 185 lb, and PTA the most recently documented weight was 227 lb on 10/11/19. Non-pitting edema to LLE documented in the flow sheet.  Patient is s/p EGD which showed grade B esophagitis with bleeding, gastritis; biopsy pending.  General Surgery has signed off.    Labs reviewed; K: 3.1 mmol/l, BUN: 7 mg/dl, creatinine: 1.05 mg/dl, lipase: 177 u/l and trending down from 10/13, GFR: 56 and trending up from 10/13.  Medications reviewed; 40 mg IV protonix BID, 40 mEq Klor-Con BID.    NUTRITION - FOCUSED PHYSICAL EXAM:  RD working remotely.  Diet Order:   Diet Order             Diet regular Room service appropriate?  Yes; Fluid consistency: Thin  Diet effective now                   EDUCATION NEEDS:   Not appropriate for education at this time  Skin:  Skin Assessment: Reviewed RN Assessment  Last BM:  PTA/unknown  Height:   Ht Readings from Last 1 Encounters:  05/17/22 5\' 5"  (1.651 m)    Weight:   Wt Readings from Last 1 Encounters:  05/18/22 85 kg     BMI:  Body mass index is 31.18 kg/m.  Estimated Nutritional Needs:  Kcal:  1700-1900 kcal Protein:  80-95 grams Fluid:  >/= 1.8 L/day     Jarome Matin, MS, RD, LDN, CNSC Clinical Dietitian PRN/Relief staff On-call/weekend pager # available in [facility]

## 2022-05-19 NOTE — Progress Notes (Signed)
PROGRESS NOTE  Stephanie Benton STM:[id number] DOB: [birth year] DOA: 05/16/2022 PCP: [name], MD  HPI/Recap of past 24 hours: Stephanie Benton is a 71 y.o. female with medical history significant of HTN, HLD, asthma, glaucoma, GERD, presented with intermittent, worsening nausea, vomiting, and epigastric pain on going for the past 2 weeks. Pt was seen about a week ago of which CTA of her chest and CT of abdomen was unremarkable, noted elevated lipase. Pt was discharged, but never got better. Pt went to her primary care and was told to go to ED due to abnormal labs. Patient does use BC powders daily, denies any other NSAID use.  In the ED, lipase mildly elevated, ultrasound showed positive Murphy sign, cholelithiasis, but no gallbladder wall thickening or pericholecystic edema.  General surgery and GI consulted.  Patient admitted for further management.   Today, patient still complaining of nausea, some epigastric pain.   Assessment/Plan: Principal Problem:   Dehydration Active Problems:   Hypertension   Bipolar disorder, in full remission, most recent episode mixed (HCC)   Pancreatitis    Intractable nausea/vomiting with epigastric pain Bleeding esophagitis with some gastritis Currently afebrile, with no leukocytosis Mildly elevated lipase, elevated bilirubin CT A/P unremarkable, RUQ USS showed cholelithiasis, but no gallbladder wall thickening or pericholecystic edema MRCP negative for choledocholithiasis GI on board, s/p EGD which showed LA grade B esophagitis with bleeding, gastritis, erythematous shows duodenopathy.  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   Weight as of this encounter: 85 kg.     Code Status: Full  Family Communication: None at bedside  Disposition Plan: Status is: Inpatient The patient will require care spanning > 2 midnights and should be moved to inpatient because: Level of care      Consultants: GI General surgery  Procedures: EGD  Antimicrobials: None  DVT prophylaxis: SCD   Objective: Vitals:   05/18/22 1614 05/18/22 1817 05/18/22 1933 05/19/22 0500  BP: 133/77  (!) 142/72 133/60  Pulse: 90  85 84  Resp: 20  20 20   Temp: 98.7 F (37.1 C)  98.9 F (37.2 C) 99 F (37.2 C)  TempSrc: Oral  Oral Oral  SpO2: 98%  97% 96%  Weight:  85 kg    Height:       No intake or output data in the 24 hours ending 05/19/22 1542  Filed Weights   05/17/22 1444 05/17/22 1656 05/18/22 1817  Weight: 103 kg 83.7 kg 85 kg    Exam: General: NAD  Cardiovascular: S1, S2 present Respiratory: CTAB Abdomen: Soft, tender, nondistended, bowel sounds present Musculoskeletal: trace bilateral pedal edema noted Skin: Normal Psychiatry: Normal mood     Data Reviewed: CBC: Recent Labs  Lab 05/16/22 1516 05/17/22 0043 05/17/22 0447 05/18/22 0147 05/19/22 2119  WBC 7.1  --  8.2 4.7 4.4  NEUTROABS 5.2  --   --  3.4 2.8  HGB 14.4 12.9 11.7* 10.7* 10.0*  HCT 41.8 38.0 34.8* 31.3* 30.3*  MCV 76.8*  --  78.0* 78.8* 79.5*  PLT 299  --  241 199 194   Basic Metabolic Panel: Recent Labs  Lab 05/16/22 1516 05/17/22 0039 05/17/22 0043 05/17/22 0447 05/18/22 0147 05/19/22 0233  NA 135  137 132* 134* 136 135  K 3.0* 3.5 3.5 3.0* 2.9* 3.1*  CL 87* 88*  --  92* 96* 99  CO2 21* 29  --  24 27 26   GLUCOSE 107* 92  --  87 75 63*  BUN 21 23  --  22 13 7*  CREATININE 2.05* 2.05*  --  1.84* 1.24* 1.05*  CALCIUM 10.1 9.3  --  9.0 8.7* 8.9  MG  --  1.4*  --  1.5* 1.8  --   PHOS  --  3.7  --  3.2  --   --    GFR: Estimated Creatinine Clearance: 52.1 mL/min (A) (by C-G formula based on SCr of 1.05 mg/dL (H)). Liver Function Tests: Recent Labs  Lab 05/16/22 1516 05/17/22 0447 05/18/22 0147 05/19/22 0233  AST 39 27 20 18   ALT 25 17 15 15   ALKPHOS 94 74 65 60  BILITOT 2.0* 1.4* 1.3* 1.2  PROT 7.2 6.0* 5.3* 5.0*  ALBUMIN 3.9 3.2* 2.9* 2.8*   Recent Labs  Lab 05/16/22 1516 05/17/22 0447 05/18/22 0147 05/19/22 0233  LIPASE 309* 210* 155* 177*   No results for input(s): "AMMONIA" in the last 168 hours. Coagulation Profile: Recent Labs  Lab 05/17/22 0039  INR 1.1   Cardiac Enzymes: Recent Labs  Lab 05/17/22 0039  CKTOTAL 138   BNP (last 3 results) No results for input(s): "PROBNP" in the last 8760 hours. HbA1C: No results for input(s): "HGBA1C" in the last 72 hours. CBG: No results for input(s): "GLUCAP" in the last 168 hours. Lipid Profile: Recent Labs    05/17/22 0447  CHOL 177  HDL 42  LDLCALC 117*  TRIG 92  CHOLHDL 4.2   Thyroid Function Tests: Recent Labs    05/17/22 0039  TSH 3.663   Anemia Panel: No results for input(s): "VITAMINB12", "FOLATE", "FERRITIN", "TIBC", "IRON", "RETICCTPCT" in the last 72 hours. Urine analysis:    Component Value Date/Time   COLORURINE AMBER (A) 05/17/2022 0208   APPEARANCEUR CLOUDY (A) 05/17/2022 0208   LABSPEC 1.018 05/17/2022 0208   PHURINE 5.0 05/17/2022 0208   GLUCOSEU NEGATIVE 05/17/2022 0208   HGBUR NEGATIVE 05/17/2022 0208   BILIRUBINUR SMALL (A) 05/17/2022 0208   KETONESUR 5 (A) 05/17/2022 0208   PROTEINUR 30 (A) 05/17/2022 0208   UROBILINOGEN 1.0 01/11/2010 1841   NITRITE NEGATIVE 05/17/2022  0208   LEUKOCYTESUR NEGATIVE 05/17/2022 0208   Sepsis Labs: @LABRCNTIP (procalcitonin:4,lacticidven:4)  ) Recent Results (from the past 240 hour(s))  SARS Coronavirus 2 by RT PCR (hospital order, performed in [facility] hospital lab) *cepheid single result test* Anterior Nasal Swab     Status: None   Collection Time: 05/17/22  5:00 AM   Specimen: Anterior Nasal Swab  Result Value Ref Range Status   SARS Coronavirus 2 by RT PCR NEGATIVE NEGATIVE Final    Comment: (NOTE) SARS-CoV-2 target nucleic acids are NOT DETECTED.  The SARS-CoV-2 RNA is generally detectable in upper and lower respiratory specimens during the acute phase of infection. The lowest concentration of SARS-CoV-2 viral copies this assay  can detect is 250 copies / mL. A negative result does not preclude SARS-CoV-2 infection and should not be used as the sole basis for treatment or other patient management decisions.  A negative result may occur with improper specimen collection / handling, submission of specimen other than nasopharyngeal swab, presence of viral mutation(s) within the areas targeted by this assay, and inadequate number of viral copies (<250 copies / mL). A negative result must be combined with clinical observations, patient history, and epidemiological information.  Fact Sheet for Patients:   RoadLapTop.co.za  Fact Sheet for Healthcare Providers: http://kim-miller.com/  This test is not yet approved or  cleared by the [place] FDA and has been authorized for detection and/or diagnosis of SARS-CoV-2 by FDA under an Emergency Use Authorization (EUA).  This EUA will remain in effect (meaning this test can be used) for the duration of the COVID-19 declaration under Section 564(b)(1) of the Act, 21 U.S.C. section 360bbb-3(b)(1), unless the authorization is terminated or revoked sooner.  Performed at [facility], [address], [place],  [place] [address]       Studies: MR ABDOMEN MRCP W WO CONTAST  Result Date: 05/19/2022 CLINICAL DATA:  Epigastric pain.  Pancreatitis suspected. EXAM: MRI ABDOMEN WITHOUT AND WITH CONTRAST (INCLUDING MRCP) TECHNIQUE: Multiplanar multisequence MR imaging of the abdomen was performed both before and after the administration of intravenous contrast. Heavily T2-weighted images of the biliary and pancreatic ducts were obtained, and three-dimensional MRCP images were rendered by post processing. CONTRAST:  13mL GADAVIST GADOBUTROL 1 MMOL/ML IV SOLN COMPARISON:  CT abdomen/pelvis 05/09/2022 FINDINGS: Lower chest: Unremarkable. Hepatobiliary: No suspicious focal abnormality within the liver parenchyma. Gallbladder is distended with numerous tiny gallstones evident. No intrahepatic or extrahepatic biliary dilation. MRCP imaging shows no evidence for choledocholithiasis. Pancreas: No substantial peripancreatic edema. No focal pancreatic mass lesion or intraparenchymal cyst. No main duct dilatation. Pancreatic parenchyma enhances throughout. Spleen:  No splenomegaly. No focal mass lesion. Adrenals/Urinary Tract: No adrenal nodule or mass. Kidneys unremarkable. Stomach/Bowel: Moderate to large hiatal hernia. Duodenum is normally positioned as is the ligament of Treitz. No small bowel or colonic dilatation within the visualized abdomen. Vascular/Lymphatic: No abdominal aortic aneurysm There is no gastrohepatic or hepatoduodenal ligament lymphadenopathy. No retroperitoneal or mesenteric lymphadenopathy. Other: No intraperitoneal free fluid. 6.3 x 7.2 x 4.7 cm chronic complex fluid collection just deep to the left rectus sheath is not substantially changed since prior CT of 05/09/2022. Musculoskeletal: No focal suspicious marrow enhancement within the visualized bony anatomy. IMPRESSION: 1. No substantial peripancreatic edema. No focal pancreatic mass lesion or intraparenchymal cyst. No main duct dilatation. 2. Cholelithiasis. No  intrahepatic or extrahepatic biliary dilation. No evidence for choledocholithiasis. 3. Moderate to large hiatal hernia. 4. Chronic fluid collection deep to the left rectus sheath is not substantially changed since prior CT of 05/09/2022. Electronically Signed   By: Kennith Center M.D.   On: 05/19/2022 11:52    Scheduled Meds:  latanoprost  1 drop Both Eyes QHS   pantoprazole (PROTONIX) IV  40 mg Intravenous Q12H   potassium chloride  40 mEq Oral BID   risperiDONE  1 mg Oral QHS   venlafaxine XR  37.5 mg Oral QHS    Continuous Infusions:     LOS: 2 days     Briant Cedar, MD [facility]  If 7PM-7AM, please contact night-coverage [email or web address] 05/19/2022, 3:42 PM

## 2022-05-19 NOTE — Progress Notes (Signed)
Mobility Specialist - Progress Note   05/19/22 1600  Mobility  Activity Transferred from bed to chair  Level of Assistance Modified independent, requires aide device or extra time  Assistive Device None  Distance Ambulated (ft) 2 ft  Activity Response Tolerated well  $Mobility charge 1 Mobility    Pt received in bed and agreeable. Left in chair w/ call bell and all needs met.   [name] Mobility Specialist

## 2022-05-20 ENCOUNTER — Inpatient Hospital Stay (HOSPITAL_COMMUNITY): Payer: Medicare Other

## 2022-05-20 DIAGNOSIS — R1013 Epigastric pain: Secondary | ICD-10-CM | POA: Diagnosis not present

## 2022-05-20 DIAGNOSIS — K209 Esophagitis, unspecified without bleeding: Secondary | ICD-10-CM | POA: Diagnosis not present

## 2022-05-20 DIAGNOSIS — E860 Dehydration: Secondary | ICD-10-CM | POA: Diagnosis not present

## 2022-05-20 DIAGNOSIS — F3178 Bipolar disorder, in full remission, most recent episode mixed: Secondary | ICD-10-CM | POA: Diagnosis not present

## 2022-05-20 LAB — CBC WITH DIFFERENTIAL/PLATELET
Abs Immature Granulocytes: 0.01 10*3/uL (ref 0.00–0.07)
Basophils Absolute: 0 10*3/uL (ref 0.0–0.1)
Basophils Relative: 1 %
Eosinophils Absolute: 0.2 10*3/uL (ref 0.0–0.5)
Eosinophils Relative: 5 %
HCT: 31.2 % — ABNORMAL LOW (ref 36.0–46.0)
Hemoglobin: 10.4 g/dL — ABNORMAL LOW (ref 12.0–15.0)
Immature Granulocytes: 0 %
Lymphocytes Relative: 24 %
Lymphs Abs: 0.9 10*3/uL (ref 0.7–4.0)
MCH: 26.2 pg (ref 26.0–34.0)
MCHC: 33.3 g/dL (ref 30.0–36.0)
MCV: 78.6 fL — ABNORMAL LOW (ref 80.0–100.0)
Monocytes Absolute: 0.3 10*3/uL (ref 0.1–1.0)
Monocytes Relative: 9 %
Neutro Abs: 2.3 10*3/uL (ref 1.7–7.7)
Neutrophils Relative %: 61 %
Platelets: 180 10*3/uL (ref 150–400)
RBC: 3.97 MIL/uL (ref 3.87–5.11)
RDW: 14.4 % (ref 11.5–15.5)
WBC: 3.7 10*3/uL — ABNORMAL LOW (ref 4.0–10.5)
nRBC: 0 % (ref 0.0–0.2)

## 2022-05-20 LAB — COMPREHENSIVE METABOLIC PANEL
ALT: 14 U/L (ref 0–44)
AST: 15 U/L (ref 15–41)
Albumin: 2.8 g/dL — ABNORMAL LOW (ref 3.5–5.0)
Alkaline Phosphatase: 63 U/L (ref 38–126)
Anion gap: 9 (ref 5–15)
BUN: 7 mg/dL — ABNORMAL LOW (ref 8–23)
CO2: 27 mmol/L (ref 22–32)
Calcium: 9.4 mg/dL (ref 8.9–10.3)
Chloride: 100 mmol/L (ref 98–111)
Creatinine, Ser: 1.09 mg/dL — ABNORMAL HIGH (ref 0.44–1.00)
GFR, Estimated: 54 mL/min — ABNORMAL LOW (ref >60)
Glucose, Bld: 68 mg/dL — ABNORMAL LOW (ref 70–99)
Potassium: 3.7 mmol/L (ref 3.5–5.1)
Sodium: 136 mmol/L (ref 135–145)
Total Bilirubin: 0.7 mg/dL (ref 0.3–1.2)
Total Protein: 5.3 g/dL — ABNORMAL LOW (ref 6.5–8.1)

## 2022-05-20 LAB — LIPASE, BLOOD: Lipase: 181 U/L — ABNORMAL HIGH (ref 11–51)

## 2022-05-20 LAB — TROPONIN I (HIGH SENSITIVITY): Troponin I (High Sensitivity): 11 ng/L (ref <18)

## 2022-05-20 LAB — SURGICAL PATHOLOGY

## 2022-05-20 MED ORDER — SENNOSIDES-DOCUSATE SODIUM 8.6-50 MG PO TABS
1.0000 | ORAL_TABLET | Freq: Every day | ORAL | Status: DC
  Administered 2022-05-21 – 2022-05-22 (×2): 1 via ORAL
  Filled 2022-05-20 (×2): qty 1

## 2022-05-20 MED ORDER — POLYETHYLENE GLYCOL 3350 17 G PO PACK
17.0000 g | PACK | Freq: Every day | ORAL | Status: DC
  Administered 2022-05-21 – 2022-05-23 (×2): 17 g via ORAL
  Filled 2022-05-20 (×2): qty 1

## 2022-05-20 MED ORDER — POLYETHYLENE GLYCOL 3350 17 G PO PACK
17.0000 g | PACK | Freq: Two times a day (BID) | ORAL | Status: DC
  Administered 2022-05-20: 17 g via ORAL
  Filled 2022-05-20: qty 1

## 2022-05-20 MED ORDER — SIMETHICONE 80 MG PO CHEW
80.0000 mg | CHEWABLE_TABLET | Freq: Four times a day (QID) | ORAL | Status: AC
  Administered 2022-05-20 – 2022-05-21 (×4): 80 mg via ORAL
  Filled 2022-05-20 (×4): qty 1

## 2022-05-20 MED ORDER — PANTOPRAZOLE SODIUM 40 MG PO TBEC
40.0000 mg | DELAYED_RELEASE_TABLET | Freq: Two times a day (BID) | ORAL | Status: DC
  Administered 2022-05-20 – 2022-05-23 (×6): 40 mg via ORAL
  Filled 2022-05-20 (×6): qty 1

## 2022-05-20 MED ORDER — SENNOSIDES-DOCUSATE SODIUM 8.6-50 MG PO TABS
1.0000 | ORAL_TABLET | Freq: Two times a day (BID) | ORAL | Status: DC
  Administered 2022-05-20: 1 via ORAL
  Filled 2022-05-20: qty 1

## 2022-05-20 MED ORDER — ORAL CARE MOUTH RINSE: 15.0000 mL | OROMUCOSAL | Status: DC | PRN

## 2022-05-20 MED ORDER — DIPHENHYDRAMINE HCL 25 MG PO CAPS
25.0000 mg | ORAL_CAPSULE | Freq: Once | ORAL | Status: AC
  Administered 2022-05-21: 25 mg via ORAL
  Filled 2022-05-20: qty 1

## 2022-05-20 NOTE — Care Management Important Message (Signed)
Important Message  Patient Details  Name: Stephanie Benton MRN: [id number] Date of Birth: 07-16-1951   Medicare Important Message Given:  Yes     Shelda Altes 05/20/2022, 11:30 AM

## 2022-05-20 NOTE — Progress Notes (Signed)
Subjective: Feeling well, but she complains about constipation.  Objective: Vital signs in last 24 hours: Temp:  [98.7 F (37.1 C)-98.8 F (37.1 C)] 98.8 F (37.1 C) (10/16 0726) Pulse Rate:  [78-90] 87 (10/16 0726) Resp:  [17-20] 20 (10/16 0726) BP: (115-122)/(61-74) 121/61 (10/16 0726) SpO2:  [95 %-96 %] 96 % (10/16 0549) Last BM Date :  (patient states she has not had BM for 2 weeks.)  Intake/Output from previous day: No intake/output data recorded. Intake/Output this shift: No intake/output data recorded.  General appearance: alert and no distress GI: tender in the epigastric and RUQ region  Lab Results: Recent Labs    05/18/22 0147 05/19/22 0233 05/20/22 0154  WBC 4.7 4.4 3.7*  HGB 10.7* 10.0* 10.4*  HCT 31.3* 30.3* 31.2*  PLT 199 194 180   BMET Recent Labs    05/18/22 0147 05/19/22 0233 05/20/22 0154  NA 136 135 136  K 2.9* 3.1* 3.7  CL 96* 99 100  CO2 27 26 27   GLUCOSE 75 63* 68*  BUN 13 7* 7*  CREATININE 1.24* 1.05* 1.09*  CALCIUM 8.7* 8.9 9.4   LFT Recent Labs    05/20/22 0154  PROT 5.3*  ALBUMIN 2.8*  AST 15  ALT 14  ALKPHOS 63  BILITOT 0.7   PT/INR No results for input(s): "LABPROT", "INR" in the last 72 hours. Hepatitis Panel No results for input(s): "HEPBSAG", "HCVAB", "HEPAIGM", "HEPBIGM" in the last 72 hours. C-Diff No results for input(s): "CDIFFTOX" in the last 72 hours. Fecal Lactopherrin No results for input(s): "FECLLACTOFRN" in the last 72 hours.  Studies/Results: DG Abd Portable 1V  Result Date: 05/20/2022 CLINICAL DATA:  Constipation. EXAM: PORTABLE ABDOMEN - 1 VIEW COMPARISON:  Abdomen pelvis CT 05/09/2022 FINDINGS: Scattered small bowel gas evident in a nonobstructive pattern. Air is seen scattered along the course of a mildly distended colon. No substantial stool volume. Rectum appears largely free of stool. Sequelae of ventral mesh placement noted. IMPRESSION: Nonobstructive bowel gas pattern. There is some gas in the  colon, but no substantial stool volume to suggest clinical constipation. Electronically Signed   By: Misty Stanley M.D.   On: 05/20/2022 08:12   MR ABDOMEN MRCP W WO CONTAST  Result Date: 05/19/2022 CLINICAL DATA:  Epigastric pain.  Pancreatitis suspected. EXAM: MRI ABDOMEN WITHOUT AND WITH CONTRAST (INCLUDING MRCP) TECHNIQUE: Multiplanar multisequence MR imaging of the abdomen was performed both before and after the administration of intravenous contrast. Heavily T2-weighted images of the biliary and pancreatic ducts were obtained, and three-dimensional MRCP images were rendered by post processing. CONTRAST:  35mL GADAVIST GADOBUTROL 1 MMOL/ML IV SOLN COMPARISON:  CT abdomen/pelvis 05/09/2022 FINDINGS: Lower chest: Unremarkable. Hepatobiliary: No suspicious focal abnormality within the liver parenchyma. Gallbladder is distended with numerous tiny gallstones evident. No intrahepatic or extrahepatic biliary dilation. MRCP imaging shows no evidence for choledocholithiasis. Pancreas: No substantial peripancreatic edema. No focal pancreatic mass lesion or intraparenchymal cyst. No main duct dilatation. Pancreatic parenchyma enhances throughout. Spleen:  No splenomegaly. No focal mass lesion. Adrenals/Urinary Tract: No adrenal nodule or mass. Kidneys unremarkable. Stomach/Bowel: Moderate to large hiatal hernia. Duodenum is normally positioned as is the ligament of Treitz. No small bowel or colonic dilatation within the visualized abdomen. Vascular/Lymphatic: No abdominal aortic aneurysm There is no gastrohepatic or hepatoduodenal ligament lymphadenopathy. No retroperitoneal or mesenteric lymphadenopathy. Other: No intraperitoneal free fluid. 6.3 x 7.2 x 4.7 cm chronic complex fluid collection just deep to the left rectus sheath is not substantially changed since prior CT of  05/09/2022. Musculoskeletal: No focal suspicious marrow enhancement within the visualized bony anatomy. IMPRESSION: 1. No substantial  peripancreatic edema. No focal pancreatic mass lesion or intraparenchymal cyst. No main duct dilatation. 2. Cholelithiasis. No intrahepatic or extrahepatic biliary dilation. No evidence for choledocholithiasis. 3. Moderate to large hiatal hernia. 4. Chronic fluid collection deep to the left rectus sheath is not substantially changed since prior CT of 05/09/2022. Electronically Signed   By: Kennith Center M.D.   On: 05/19/2022 11:52    Medications: Scheduled:  feeding supplement  1 Container Oral TID BM   latanoprost  1 drop Both Eyes QHS   multivitamin with minerals  1 tablet Oral Daily   pantoprazole (PROTONIX) IV  40 mg Intravenous Q12H   polyethylene glycol  17 g Oral BID   risperiDONE  1 mg Oral QHS   senna-docusate  1 tablet Oral BID   venlafaxine XR  37.5 mg Oral QHS   Continuous:  Assessment/Plan: 1) LA Grade B esophagitis. 2) Epigastric/RUQ pain - minimally improved. 3) Cholelithiasis.   4) Constipation.   Clinically she is improved as she is able to tolerate substantial PO.  The last time she had nausea/vomiting was this past Friday.  She does not exhibit a significant improvement in her epigastric and RUQ pain, but it does not seem to be bothering her at this time.  She does complain about constipation, but Senokot and Miralax were administered today.  Plan: 1) Change to PO pantoprazole. 2) Continue with MIralax and Senokot. 3) Follow up in the office in two weeks upon discharge. 4) Signing off.  LOS: 3 days   Josceline Chenard D 05/20/2022, 12:10 PM

## 2022-05-20 NOTE — Progress Notes (Signed)
PT Cancellation Note  Patient Details Name: Stephanie Benton MRN: [id number] DOB: 01-18-1951   Cancelled Treatment:    Reason Eval/Treat Not Completed: Other (comment).  Pt complained she had another therapy session and was too tired, will retry as time and pt allow.   Ramond Dial 05/20/2022, 3:47 PM  Mee Hives, PT PhD Acute Rehab Dept. Number: [place] [facility] [place]

## 2022-05-20 NOTE — Progress Notes (Signed)
Patient c/o abd pain 7/10, upper abdomen aching, worse with palpation.  Patient states no BM for 2 weeks.  C/o slight nausea.  Abd distended, soft and bowel sounds noted in all 4 quads.  Ultram po given.

## 2022-05-20 NOTE — Progress Notes (Signed)
Occupational Therapy Treatment Patient Details Name: Stephanie Benton MRN: [id number] DOB: 02-23-1951 Today's Date: 05/20/2022   History of present illness Pt is a 71 y/o female admitted 05/17/22 with dehydration in setting of N&V and diarrhea for the past 2 weeks. PMH:  HTN, HLD, asthma, glaucoma, GERD   OT comments  Patient received in supine and agreeable to OT session. Patient able to get to EOB with supervision and required increased time before ambulating to sink for grooming tasks. Patient performed item with RW with min guard assist and verbal cues before requiring a seated rest break. Patient ambulated to bathroom and performed transfer into walk in shower with simulated tub height to step over with min guard assist and used rails to assist with balance.  Patient provided education on safety and need for shower chair for increased safety. Patient stated fatigue and asked to return to supine. Patient continues to make good gain and discharge recommendations continue to be appropriate.    Recommendations for follow up therapy are one component of a multi-disciplinary discharge planning process, led by the attending physician.  Recommendations may be updated based on patient status, additional functional criteria and insurance authorization.    Follow Up Recommendations  No OT follow up    Assistance Recommended at Discharge PRN  Patient can return home with the following  Assistance with cooking/housework;Assist for transportation   Equipment Recommendations  BSC/3in1    Recommendations for Other Services      Precautions / Restrictions Precautions Precautions: Fall Restrictions Weight Bearing Restrictions: No       Mobility Bed Mobility Overal bed mobility: Needs Assistance Bed Mobility: Supine to Sit, Sit to Supine     Supine to sit: Supervision Sit to supine: Supervision   General bed mobility comments: HOB raised and supervision to perform    Transfers Overall  transfer level: Needs assistance Equipment used: Rolling walker (2 wheels) Transfers: Sit to/from Stand Sit to Stand: Min guard           General transfer comment: min guard for safety wtih tub and toilet transfers     Balance Overall balance assessment: Needs assistance Sitting-balance support: No upper extremity supported, Feet supported Sitting balance-Leahy Scale: Good     Standing balance support: No upper extremity supported Standing balance-Leahy Scale: Fair Standing balance comment: stood at sink with no UE support                           ADL either performed or assessed with clinical judgement   ADL Overall ADL's : Needs assistance/impaired     Grooming: Wash/dry hands;Wash/dry face;Supervision/safety;Standing Grooming Details (indicate cue type and reason): at sink                 Toilet Transfer: Min guard;Ambulation;Rolling walker (2 wheels) Toilet Transfer Details (indicate cue type and reason): min guard for safety     Tub/ Shower Transfer: Min guard;Rolling walker (2 wheels);Grab bars Tub/Shower Transfer Details (indicate cue type and reason): performed transfer into walk in shower with simulated tub height and min guard assist with education for safety   General ADL Comments: item retrieval performed with RW and min guard assist    Extremity/Trunk Assessment              Vision       Perception     Praxis      Cognition Arousal/Alertness: Awake/alert Behavior During Therapy: WFL for tasks assessed/performed Overall Cognitive  Status: Within Functional Limits for tasks assessed                                          Exercises      Shoulder Instructions       General Comments HR 109 at rest and 140 during mobility    Pertinent Vitals/ Pain       Pain Assessment Pain Assessment: Faces Faces Pain Scale: Hurts a little bit Pain Location: BLE Pain Descriptors / Indicators: Grimacing,  Guarding Pain Intervention(s): Limited activity within patient's tolerance, Monitored during session, Repositioned  Home Living Family/patient expects to be discharged to:: Private residence Living Arrangements: Alone                                      Prior Functioning/Environment              Frequency  Min 2X/week        Progress Toward Goals  OT Goals(current goals can now be found in the care plan section)  Progress towards OT goals: Progressing toward goals  Acute Rehab OT Goals Patient Stated Goal: go home OT Goal Formulation: With patient Time For Goal Achievement: 05/31/22 Potential to Achieve Goals: Good ADL Goals Pt Will Transfer to Toilet: with modified independence;ambulating Pt Will Perform Tub/Shower Transfer: Tub transfer;with modified independence;ambulating;shower seat Additional ADL Goal #1: Pt to demo ability to gather ADL/IADL items using least restrictive AD with Modified Independence  Plan Discharge plan remains appropriate    Co-evaluation                 AM-PAC OT "6 Clicks" Daily Activity     Outcome Measure   Help from another person eating meals?: None Help from another person taking care of personal grooming?: A Little Help from another person toileting, which includes using toliet, bedpan, or urinal?: A Little Help from another person bathing (including washing, rinsing, drying)?: A Little Help from another person to put on and taking off regular upper body clothing?: A Little Help from another person to put on and taking off regular lower body clothing?: A Little 6 Click Score: 19    End of Session Equipment Utilized During Treatment: Rolling walker (2 wheels)  OT Visit Diagnosis: Other abnormalities of gait and mobility (R26.89)   Activity Tolerance Patient tolerated treatment well   Patient Left in bed;with call bell/phone within reach   Nurse Communication Mobility status        Time:  2956-2130 OT Time Calculation (min): 24 min  Charges: OT General Charges $OT Visit: 1 Visit OT Treatments $Self Care/Home Management : 23-37 mins  Alfonse Flavors, OTA Acute Rehabilitation Services  Office [phone]   Dewain Penning 05/20/2022, 9:54 AM

## 2022-05-20 NOTE — Progress Notes (Signed)
PROGRESS NOTE  [name] QMV:[id number] DOB: [birth year] DOA: 05/16/2022 PCP: [name], MD  HPI/Recap of past 24 hours: Stephanie Benton is a 71 y.o. female with medical history significant of HTN, HLD, asthma, glaucoma, GERD, presented with intermittent, worsening nausea, vomiting, and epigastric pain on going for the past 2 weeks. Pt was seen about a week ago of which CTA of her chest and CT of abdomen was unremarkable, noted elevated lipase. Pt was discharged, but never got better. Pt went to her primary care and was told to go to ED due to abnormal labs. Patient does use BC powders daily, denies any other NSAID use.  In the ED, lipase mildly elevated, ultrasound showed positive Murphy sign, cholelithiasis, but no gallbladder wall thickening or pericholecystic edema.  General surgery and GI consulted.  Patient admitted for further management.     Today, patient continues to complain of nausea, with inability to eat adequately, now with generalized abdominal pain.  Reports feeling constipated, has not had a bowel movement in weeks as per patient but has not also been able to eat.  Abdominal x-ray did not show any significant stool impaction.   Assessment/Plan: Principal Problem:   Dehydration Active Problems:   Hypertension   Bipolar disorder, in full remission, most recent episode mixed (HCC)   Pancreatitis    Intractable nausea/vomiting with epigastric pain Bleeding esophagitis with some gastritis Currently afebrile, with no leukocytosis Mildly elevated lipase, elevated bilirubin CT A/P unremarkable, RUQ USS showed cholelithiasis, but no gallbladder wall thickening or pericholecystic edema MRCP negative for choledocholithiasis DG abdomen, showed some gas in the colon, but no substantial stool volume to suggest clinical constipation GI on board, s/p EGD which showed LA grade B esophagitis with bleeding, gastritis, erythematous shows duodenopathy.  Biopsy pending.  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   Weight as of this encounter: 85 kg.     Code Status: Full  Family Communication: None at bedside  Disposition Plan: Status is: Inpatient The patient will require care spanning > 2 midnights and should be moved to inpatient because: Level of care      Consultants: GI General surgery  Procedures: EGD  Antimicrobials: None  DVT prophylaxis: SCD   Objective: Vitals:   05/19/22 1100 05/19/22 2115 05/20/22 0549 05/20/22 0726  BP: 131/63 122/64 115/74 121/61  Pulse:  78 90 87  Resp: 20 18 17 20   Temp:  98.7 F (37.1 C) 98.8 F (37.1 C) 98.8 F (37.1 C)  TempSrc:  Oral Oral Oral  SpO2:  95% 96%   Weight:      Height:        Intake/Output Summary (Last 24 hours) at 05/20/2022 1500 Last data filed at 05/20/2022 0900 Gross per 24 hour  Intake 240  ml  Output --  Net 240 ml    Filed Weights   05/17/22 1444 05/17/22 1656 05/18/22 1817  Weight: 103 kg 83.7 kg 85 kg    Exam: General: NAD  Cardiovascular: S1, S2 present Respiratory: CTAB Abdomen: Soft, tender, nondistended, bowel sounds present Musculoskeletal: No bilateral pedal edema noted Skin: Normal Psychiatry: Normal mood      Data Reviewed: CBC: Recent Labs  Lab 05/16/22 1516 05/17/22 0043 05/17/22 0447 05/18/22 0147 05/19/22 0233 05/20/22 0154  WBC 7.1  --  8.2 4.7 4.4 3.7*  NEUTROABS 5.2  --   --  3.4 2.8 2.3  HGB 14.4 12.9 11.7* 10.7* 10.0* 10.4*  HCT 41.8 38.0 34.8* 31.3* 30.3* 31.2*  MCV 76.8*  --  78.0* 78.8* 79.5* 78.6*  PLT 299  --  241 199 194 180   Basic Metabolic Panel: Recent Labs  Lab 05/17/22 0039 05/17/22 0043 05/17/22 0447 05/18/22 0147 05/19/22 0233 05/20/22 0154  NA 137 132* 134* 136 135 136  K 3.5 3.5 3.0* 2.9* 3.1* 3.7  CL 88*  --  92* 96* 99 100  CO2 29  --  24 27 26 27   GLUCOSE 92  --  87 75 63* 68*  BUN 23  --  22 13 7* 7*  CREATININE 2.05*  --  1.84* 1.24* 1.05* 1.09*  CALCIUM 9.3  --  9.0 8.7* 8.9 9.4  MG 1.4*  --  1.5* 1.8  --   --   PHOS 3.7  --  3.2  --   --   --    GFR: Estimated Creatinine Clearance: 50.2 mL/min (A) (by C-G formula based on SCr of 1.09 mg/dL (H)). Liver Function Tests: Recent Labs  Lab 05/16/22 1516 05/17/22 0447 05/18/22 0147 05/19/22 0233 05/20/22 0154  AST 39 27 20 18 15   ALT 25 17 15 15 14   ALKPHOS 94 74 65 60 63  BILITOT 2.0* 1.4* 1.3* 1.2 0.7  PROT 7.2 6.0* 5.3* 5.0* 5.3*  ALBUMIN 3.9 3.2* 2.9* 2.8* 2.8*   Recent Labs  Lab 05/16/22 1516 05/17/22 0447 05/18/22 0147 05/19/22 0233 05/20/22 0154  LIPASE 309* 210* 155* 177* 181*   No results for input(s): "AMMONIA" in the last 168 hours. Coagulation Profile: Recent Labs  Lab 05/17/22 0039  INR 1.1   Cardiac Enzymes: Recent Labs  Lab 05/17/22 0039  CKTOTAL 138   BNP (last 3 results) No results for input(s): "PROBNP" in the last 8760 hours. HbA1C: No results for input(s): "HGBA1C" in the last 72 hours. CBG: No results for input(s): "GLUCAP" in the last 168 hours. Lipid Profile: No results for input(s): "CHOL", "HDL", "LDLCALC", "TRIG", "CHOLHDL", "LDLDIRECT" in the last 72 hours.  Thyroid Function Tests: No results for input(s): "TSH", "T4TOTAL",  "FREET4", "T3FREE", "THYROIDAB" in the last 72 hours.  Anemia Panel: No results for input(s): "VITAMINB12", "FOLATE", "FERRITIN", "TIBC", "IRON", "RETICCTPCT" in the last 72 hours. Urine analysis:    Component Value Date/Time   COLORURINE AMBER (A) 05/17/2022 0208   APPEARANCEUR CLOUDY (A) 05/17/2022 0208   LABSPEC 1.018 05/17/2022 0208   PHURINE 5.0 05/17/2022 0208   GLUCOSEU NEGATIVE 05/17/2022 0208   HGBUR NEGATIVE 05/17/2022 0208   BILIRUBINUR SMALL (A) 05/17/2022 0208   KETONESUR 5 (A) 05/17/2022 0208   PROTEINUR 30 (A) 05/17/2022 0208   UROBILINOGEN 1.0 01/11/2010 1841   NITRITE NEGATIVE 05/17/2022 0208   LEUKOCYTESUR NEGATIVE 05/17/2022 0208   Sepsis Labs: @LABRCNTIP (procalcitonin:4,lacticidven:4)  ) Recent Results (from the past 240 hour(s))  SARS Coronavirus  2 by RT PCR (hospital order, performed in [facility] hospital lab) *cepheid single result test* Anterior Nasal Swab     Status: None   Collection Time: 05/17/22  5:00 AM   Specimen: Anterior Nasal Swab  Result Value Ref Range Status   SARS Coronavirus 2 by RT PCR NEGATIVE NEGATIVE Final    Comment: (NOTE) SARS-CoV-2 target nucleic acids are NOT DETECTED.  The SARS-CoV-2 RNA is generally detectable in upper and lower respiratory specimens during the acute phase of infection. The lowest concentration of SARS-CoV-2 viral copies this assay can detect is 250 copies / mL. A negative result does not preclude SARS-CoV-2 infection and should not be used as the sole basis for treatment or other patient management decisions.  A negative result may occur with improper specimen collection / handling, submission of specimen other than nasopharyngeal swab, presence of viral mutation(s) within the areas targeted by this assay, and inadequate number of viral copies (<250 copies / mL). A negative result must be combined with clinical observations, patient history, and epidemiological information.  Fact Sheet for Patients:    RoadLapTop.co.za  Fact Sheet for Healthcare Providers: http://kim-miller.com/  This test is not yet approved or  cleared by the [place] FDA and has been authorized for detection and/or diagnosis of SARS-CoV-2 by FDA under an Emergency Use Authorization (EUA).  This EUA will remain in effect (meaning this test can be used) for the duration of the COVID-19 declaration under Section 564(b)(1) of the Act, 21 U.S.C. section 360bbb-3(b)(1), unless the authorization is terminated or revoked sooner.  Performed at [facility], [address], [place], [place] [address]       Studies: DG Abd Portable 1V  Result Date: 05/20/2022 CLINICAL DATA:  Constipation. EXAM: PORTABLE ABDOMEN - 1 VIEW COMPARISON:  Abdomen pelvis CT 05/09/2022 FINDINGS: Scattered small bowel gas evident in a nonobstructive pattern. Air is seen scattered along the course of a mildly distended colon. No substantial stool volume. Rectum appears largely free of stool. Sequelae of ventral mesh placement noted. IMPRESSION: Nonobstructive bowel gas pattern. There is some gas in the colon, but no substantial stool volume to suggest clinical constipation. Electronically Signed   By: Kennith Center M.D.   On: 05/20/2022 08:12    Scheduled Meds:  feeding supplement  1 Container Oral TID BM   latanoprost  1 drop Both Eyes QHS   multivitamin with minerals  1 tablet Oral Daily   pantoprazole  40 mg Oral BID AC   [START ON 05/21/2022] polyethylene glycol  17 g Oral Daily   risperiDONE  1 mg Oral QHS   [START ON 05/21/2022] senna-docusate  1 tablet Oral QHS   simethicone  80 mg Oral QID   venlafaxine XR  37.5 mg Oral QHS    Continuous Infusions:     LOS: 3 days     Briant Cedar, MD [facility]  If 7PM-7AM, please contact night-coverage [email or web address] 05/20/2022, 3:00 PM

## 2022-05-21 ENCOUNTER — Encounter (HOSPITAL_COMMUNITY): Payer: Self-pay | Admitting: Gastroenterology

## 2022-05-21 DIAGNOSIS — E860 Dehydration: Secondary | ICD-10-CM | POA: Diagnosis not present

## 2022-05-21 DIAGNOSIS — K859 Acute pancreatitis without necrosis or infection, unspecified: Secondary | ICD-10-CM | POA: Diagnosis not present

## 2022-05-21 DIAGNOSIS — F3178 Bipolar disorder, in full remission, most recent episode mixed: Secondary | ICD-10-CM | POA: Diagnosis not present

## 2022-05-21 DIAGNOSIS — I10 Essential (primary) hypertension: Secondary | ICD-10-CM | POA: Diagnosis not present

## 2022-05-21 LAB — CBC WITH DIFFERENTIAL/PLATELET
Abs Immature Granulocytes: 0.01 K/uL (ref 0.00–0.07)
Basophils Absolute: 0 K/uL (ref 0.0–0.1)
Basophils Relative: 1 %
Eosinophils Absolute: 0.2 K/uL (ref 0.0–0.5)
Eosinophils Relative: 6 %
HCT: 31.3 % — ABNORMAL LOW (ref 36.0–46.0)
Hemoglobin: 10.1 g/dL — ABNORMAL LOW (ref 12.0–15.0)
Immature Granulocytes: 0 %
Lymphocytes Relative: 27 %
Lymphs Abs: 1 K/uL (ref 0.7–4.0)
MCH: 25.6 pg — ABNORMAL LOW (ref 26.0–34.0)
MCHC: 32.3 g/dL (ref 30.0–36.0)
MCV: 79.4 fL — ABNORMAL LOW (ref 80.0–100.0)
Monocytes Absolute: 0.3 K/uL (ref 0.1–1.0)
Monocytes Relative: 9 %
Neutro Abs: 2.2 K/uL (ref 1.7–7.7)
Neutrophils Relative %: 57 %
Platelets: 212 K/uL (ref 150–400)
RBC: 3.94 MIL/uL (ref 3.87–5.11)
RDW: 14.5 % (ref 11.5–15.5)
WBC: 3.8 K/uL — ABNORMAL LOW (ref 4.0–10.5)
nRBC: 0 % (ref 0.0–0.2)

## 2022-05-21 LAB — COMPREHENSIVE METABOLIC PANEL WITH GFR
ALT: 14 U/L (ref 0–44)
AST: 15 U/L (ref 15–41)
Albumin: 2.7 g/dL — ABNORMAL LOW (ref 3.5–5.0)
Alkaline Phosphatase: 66 U/L (ref 38–126)
Anion gap: 10 (ref 5–15)
BUN: 5 mg/dL — ABNORMAL LOW (ref 8–23)
CO2: 27 mmol/L (ref 22–32)
Calcium: 9.4 mg/dL (ref 8.9–10.3)
Chloride: 101 mmol/L (ref 98–111)
Creatinine, Ser: 1.1 mg/dL — ABNORMAL HIGH (ref 0.44–1.00)
GFR, Estimated: 53 mL/min — ABNORMAL LOW
Glucose, Bld: 75 mg/dL (ref 70–99)
Potassium: 3.4 mmol/L — ABNORMAL LOW (ref 3.5–5.1)
Sodium: 138 mmol/L (ref 135–145)
Total Bilirubin: 0.5 mg/dL (ref 0.3–1.2)
Total Protein: 5.1 g/dL — ABNORMAL LOW (ref 6.5–8.1)

## 2022-05-21 LAB — LIPASE, BLOOD: Lipase: 188 U/L — ABNORMAL HIGH (ref 11–51)

## 2022-05-21 MED ORDER — MUPIROCIN 2 % EX OINT
1.0000 | TOPICAL_OINTMENT | Freq: Two times a day (BID) | CUTANEOUS | Status: DC
  Administered 2022-05-22 – 2022-05-23 (×2): 1 via NASAL
  Filled 2022-05-21: qty 22

## 2022-05-21 MED ORDER — POTASSIUM CHLORIDE CRYS ER 20 MEQ PO TBCR
40.0000 meq | EXTENDED_RELEASE_TABLET | Freq: Once | ORAL | Status: AC
  Administered 2022-05-21: 40 meq via ORAL
  Filled 2022-05-21: qty 2

## 2022-05-21 NOTE — Progress Notes (Signed)
[facility] Progress Note  4 Days Post-Op  Subjective: CC:  Reports ongoing epigastric pain after eating. Tolerating liquids and some peaches but states when she had 2 bites of pancake and some banana it caused her epigastric discomfort and nausea. She denies vomiting.  Objective: Vital signs in last 24 hours: Temp:  [97.4 F (36.3 C)-98.5 F (36.9 C)] 97.9 F (36.6 C) (10/17 1253) Pulse Rate:  [78-95] 95 (10/17 1253) Resp:  [15-23] 21 (10/17 1253) BP: (110-137)/(57-81) 114/57 (10/17 1253) SpO2:  [94 %-98 %] 98 % (10/17 1253) Last BM Date :  (patient states she has not had BM for 2 weeks.)  Intake/Output from previous day: 10/16 0701 - 10/17 0700 In: 840 [P.O.:840] Out: -  Intake/Output this shift: No intake/output data recorded.  PE: Gen:  Alert, NAD, pleasant Card:  Regular rate and rhythm, pedal pulses 2+ BL Pulm:  Normal effort, clear to auscultation bilaterally Abd: Soft, mild epigastric pain, no rebound tenderness, negative murphy's sign Skin: warm and dry, no rashes  Psych: A&Ox3   Lab Results:  Recent Labs    05/20/22 0154 05/21/22 0152  WBC 3.7* 3.8*  HGB 10.4* 10.1*  HCT 31.2* 31.3*  PLT 180 212   BMET Recent Labs    05/20/22 0154 05/21/22 0152  NA 136 138  K 3.7 3.4*  CL 100 101  CO2 27 27  GLUCOSE 68* 75  BUN 7* 5*  CREATININE 1.09* 1.10*  CALCIUM 9.4 9.4   PT/INR No results for input(s): "LABPROT", "INR" in the last 72 hours. CMP     Component Value Date/Time   NA 138 05/21/2022 0152   K 3.4 (L) 05/21/2022 0152   CL 101 05/21/2022 0152   CO2 27 05/21/2022 0152   GLUCOSE 75 05/21/2022 0152   BUN 5 (L) 05/21/2022 0152   CREATININE 1.10 (H) 05/21/2022 0152   CALCIUM 9.4 05/21/2022 0152   PROT 5.1 (L) 05/21/2022 0152   ALBUMIN 2.7 (L) 05/21/2022 0152   AST 15 05/21/2022 0152   ALT 14 05/21/2022 0152   ALKPHOS 66 05/21/2022 0152   BILITOT 0.5 05/21/2022 0152   GFRNONAA 53 (L) 05/21/2022 0152   GFRAA >60 10/11/2019  1636   Lipase     Component Value Date/Time   LIPASE 188 (H) 05/21/2022 0152       Studies/Results: DG Abd Portable 1V  Result Date: 05/20/2022 CLINICAL DATA:  Constipation. EXAM: PORTABLE ABDOMEN - 1 VIEW COMPARISON:  Abdomen pelvis CT 05/09/2022 FINDINGS: Scattered small bowel gas evident in a nonobstructive pattern. Air is seen scattered along the course of a mildly distended colon. No substantial stool volume. Rectum appears largely free of stool. Sequelae of ventral mesh placement noted. IMPRESSION: Nonobstructive bowel gas pattern. There is some gas in the colon, but no substantial stool volume to suggest clinical constipation. Electronically Signed   By: Kennith Center M.D.   On: 05/20/2022 08:12    Anti-infectives: Anti-infectives (From admission, onward)    None        Assessment/Plan  71 y/o F who presents with 3 weeks of poor PO intake, including intolerance of liquids, and vomiting.    - EGD 10/13 with esophagitis, gastritis; treated with Carafate x 2 doses without improvement so GI stopped this medication.  - she has cholelithiasis/gallbladder sludge without evidence of cholecystitis. If her sxs did not improve with carafate and her gastritis is only mild then I do suspect her sxs are attributable to symptomatic gallstones and she would benefit from  cholecystectomy.  - will review further with Dr. Ninfa Linden - would likely benefit from cholecystectomy this admission.    LOS: 4 days   I reviewed nursing notes, Consultant GI notes, hospitalist notes, last 24 h vitals and pain scores, last 48 h intake and output, last 24 h labs and trends, and last 24 h imaging results.    Obie Dredge, PA-C [facility] Please see Amion for pager number during day hours 7:00am-4:30pm

## 2022-05-21 NOTE — Progress Notes (Signed)
Physical Therapy Treatment Patient Details Name: Stephanie Benton MRN: [id number] DOB: 09-30-1950 Today's Date: 05/21/2022   History of Present Illness Pt is a 71 y/o female admitted 05/17/22 with dehydration in setting of N&V and diarrhea for the past 2 weeks. PMH:  HTN, HLD, asthma, glaucoma, GERD    PT Comments    Pt was seen for progressing gait and instructed her in ROM to legs to warm up for a walk to BR with staff.  Her attitude is great, very motivated and interested in being active to get home.  Follow for acute PT goals, with a focus on standing balance and endurance with gait as she can tolerate.  Has HHPT ordered and will continue to recommend this for her personal safety.   Recommendations for follow up therapy are one component of a multi-disciplinary discharge planning process, led by the attending physician.  Recommendations may be updated based on patient status, additional functional criteria and insurance authorization.  Follow Up Recommendations  Home health PT     Assistance Recommended at Discharge Intermittent Supervision/Assistance  Patient can return home with the following A little help with walking and/or transfers;A little help with bathing/dressing/bathroom;Assist for transportation   Equipment Recommendations  None recommended by PT    Recommendations for Other Services       Precautions / Restrictions Precautions Precautions: Fall Restrictions Weight Bearing Restrictions: No     Mobility  Bed Mobility               General bed mobility comments: up on walker when PT arrived    Transfers Overall transfer level: Needs assistance Equipment used: Rolling walker (2 wheels) Transfers: Sit to/from Stand Sit to Stand: Min guard                Ambulation/Gait Ambulation/Gait assistance: Min guard Gait Distance (Feet): 75 Feet Assistive device: Rolling walker (2 wheels) Gait Pattern/deviations: Step-through pattern, Decreased stride  length, Narrow base of support Gait velocity: reduced Gait velocity interpretation: <1.31 ft/sec, indicative of household ambulator Pre-gait activities: standing balance ck General Gait Details: careful turns but no LOB   Optometrist    Modified Rankin (Stroke Patients Only)       Balance Overall balance assessment: Needs assistance Sitting-balance support: Feet supported Sitting balance-Leahy Scale: Good     Standing balance support: Bilateral upper extremity supported, During functional activity Standing balance-Leahy Scale: Fair Standing balance comment: less than fair dynamically                            Cognition Arousal/Alertness: Awake/alert Behavior During Therapy: WFL for tasks assessed/performed Overall Cognitive Status: Within Functional Limits for tasks assessed                                          Exercises General Exercises - Lower Extremity Ankle Circles/Pumps: AAROM, AROM, 5 reps Quad Sets: AROM, 10 reps Gluteal Sets: AROM, 10 reps    General Comments General comments (skin integrity, edema, etc.): pt is demonstrating low tolerance for extended standing, both from pain and endurance standpoint      Pertinent Vitals/Pain Pain Assessment Pain Assessment: 0-10 Pain Score: 7  Pain Location: abdomen after trying to eat lunch Pain Descriptors / Indicators: Grimacing, Guarding Pain Intervention(s): Limited activity  within patient's tolerance, Monitored during session, Premedicated before session, Repositioned    Home Living                          Prior Function            PT Goals (current goals can now be found in the care plan section) Acute Rehab PT Goals Patient Stated Goal: get well Progress towards PT goals: Progressing toward goals    Frequency    Min 3X/week      PT Plan Current plan remains appropriate    Co-evaluation              AM-PAC  PT "6 Clicks" Mobility   Outcome Measure  Help needed turning from your back to your side while in a flat bed without using bedrails?: None Help needed moving from lying on your back to sitting on the side of a flat bed without using bedrails?: A Little Help needed moving to and from a bed to a chair (including a wheelchair)?: A Little Help needed standing up from a chair using your arms (e.g., wheelchair or bedside chair)?: A Little Help needed to walk in hospital room?: A Little Help needed climbing 3-5 steps with a railing? : A Little 6 Click Score: 19    End of Session Equipment Utilized During Treatment: Gait belt Activity Tolerance: Patient tolerated treatment well Patient left: in bed;with call bell/phone within reach;with nursing/sitter in room Nurse Communication: Mobility status PT Visit Diagnosis: Unsteadiness on feet (R26.81);Other abnormalities of gait and mobility (R26.89);Repeated falls (R29.6);History of falling (Z91.81);Muscle weakness (generalized) (M62.81);Difficulty in walking, not elsewhere classified (R26.2);Pain     Time: 3536-1443 PT Time Calculation (min) (ACUTE ONLY): 16 min  Charges:  $Gait Training: 8-22 mins Ramond Dial 05/21/2022, 3:39 PM  Mee Hives, PT PhD Acute Rehab Dept. Number: [place]

## 2022-05-21 NOTE — Plan of Care (Signed)

## 2022-05-21 NOTE — TOC Initial Note (Signed)
Transition of Care [facility]) - Initial/Assessment Note    Patient Details  Name: Stephanie Benton MRN: [id number] Date of Birth: 14-Jul-1951  Transition of Care [facility]) CM/SW Contact:    Bethena Roys, RN Phone Number: 05/21/2022, 10:29 AM  Clinical Narrative: Risk for readmission assessment completed. PTA patient was from home alone. Patient states she has support of children and grandchildren. Patient states she has an aide in the home. Patient has PCP at [facility] and they provide transportation to appointments. Patient reports that she gets her medications without any issues. Case Manager discussed home health services and the patient is agreeable to services. Medicare.gov list provided to the patient and she chose [facility]. Case Manager made the referral and start of care to begin within 24-48 hours post transition home. Patient states her grandson will provide transportation home if she is discharged today. No further needs identified at this time.                    Expected Discharge Plan: [place] Barriers to Discharge: No Barriers Identified   Patient Goals and CMS Choice Patient states their goals for this hospitalization and ongoing recovery are:: to return home with home health services. CMS Medicare.gov Compare Post Acute Care list provided to:: Patient Choice offered to / list presented to : Patient  Expected Discharge Plan and Services Expected Discharge Plan: [place] In-house Referral: NA Discharge Planning Services: CM Consult Post Acute Care Choice: [place] arrangements for the past 2 months: Apartment                 DME Arranged: N/A DME Agency: NA       HH Arranged: PT HH Agency: [facility] Date [place]: 05/21/22 Time [place]: 70 Representative spoke with at [place]: [place] Arrangements/Services Living arrangements for the past 2  months: [place] with:: Self Patient language and need for interpreter reviewed:: Yes Do you feel safe going back to the place where you live?: Yes      Need for Family Participation in Patient Care: Yes (Comment) Care giver support system in place?: Yes (comment)   Criminal Activity/Legal Involvement Pertinent to Current Situation/Hospitalization: No - Comment as needed  Activities of Daily Living Home Assistive Devices/Equipment: Walker (specify type) ADL Screening (condition at time of admission) Patient's cognitive ability adequate to safely complete daily activities?: Yes Is the patient deaf or have difficulty hearing?: No Does the patient have difficulty seeing, even when wearing glasses/contacts?: No Does the patient have difficulty concentrating, remembering, or making decisions?: No Patient able to express need for assistance with ADLs?: Yes Does the patient have difficulty dressing or bathing?: Yes Independently performs ADLs?: No Communication: Independent Dressing (OT): Independent Grooming: Independent Feeding: Independent Bathing: Needs assistance Is this a change from baseline?: Change from baseline, expected to last <3 days Toileting: Needs assistance Is this a change from baseline?: Change from baseline, expected to last <3 days In/Out Bed: Independent Walks in Home: Independent Does the patient have difficulty walking or climbing stairs?: Yes Weakness of Legs: Both Weakness of Arms/Hands: None  Permission Sought/Granted Permission sought to share information with : Case Manager, Customer service manager, Family Supports Permission granted to share information with : Yes, Verbal Permission Granted     Permission granted to share info w AGENCY: [facility]        Emotional Assessment Appearance::  Appears stated age Attitude/Demeanor/Rapport: Engaged Affect (typically observed): Appropriate Orientation: : Oriented to  Time, Oriented to  Place, Oriented to Self, Oriented to Situation Alcohol / Substance Use: Not Applicable Psych Involvement: No (comment)  Admission diagnosis:  Dehydration [E86.0] Hypokalemia [E87.6] AKI (acute kidney injury) (HCC) [N17.9] Increased anion gap metabolic acidosis [E87.29] Acute pancreatitis without infection or necrosis, unspecified pancreatitis type [K85.90] Esophagitis determined by endoscopy [K20.90] Patient Active Problem List   Diagnosis Date Noted   Abdominal pain, chronic, epigastric    LFTs abnormal    Pancreatitis 05/17/2022   Metabolic acidosis 05/17/2022   Esophagitis determined by endoscopy 05/17/2022   Dehydration 05/16/2022   Sleep disturbance 08/21/2021   Primary osteoarthritis of left knee 05/12/2018   Insomnia due to mental condition 05/08/2018   Chronic bilateral low back pain 03/25/2018   Tobacco abuse counseling 02/18/2018   Hyperkalemia 02/18/2018   Chest pain 02/17/2018   COPD with acute exacerbation (HCC) 02/17/2018   Hypertension 02/17/2018   Depression with anxiety 02/17/2018   AKI (acute kidney injury) (HCC)    Normochromic normocytic anemia    Bipolar disorder, in partial remission, most recent episode mixed (HCC) 07/04/2017   Osteopenia of multiple sites 07/04/2017   Bipolar disorder, in full remission, most recent episode mixed (HCC) 07/04/2017   Cigarette smoker 06/12/2017   Bilateral hip pain 05/01/2017   B12 deficiency 03/04/2017   Spondylosis of lumbar region without myelopathy or radiculopathy 08/08/2016   Risk for falls 05/06/2016   Hypercholesterolemia 02/21/2016   PCP:  Sharmon Revere, MD Pharmacy:   [id number] [place],  - [address] AT [facility] & [address] [place] [address] Phone: [phone] Fax: [phone]  [facility] - [place], [place] - [address] [place] [address] Phone: [phone] Fax:  [phone]  Readmission Risk Interventions    05/21/2022   10:04 AM  Readmission Risk Prevention Plan  Transportation Screening Complete  Home Care Screening Complete  Medication Review (RN CM) Complete

## 2022-05-21 NOTE — Progress Notes (Signed)
PROGRESS NOTE  [name] CVU:[id number] DOB: [birth year] DOA: 05/16/2022 PCP: Sharmon Revere, MD  HPI/Recap of past 24 hours: Stephanie Benton is a 71 y.o. female with medical history significant of HTN, HLD, asthma, glaucoma, GERD, presented with intermittent, worsening nausea, vomiting, and epigastric pain on going for the past 2 weeks. Pt was seen about a week ago of which CTA of her chest and CT of abdomen was unremarkable, noted elevated lipase. Pt was discharged, but never got better. Pt went to her primary care and was told to go to ED due to abnormal labs. Patient does use BC powders daily, denies any other NSAID use.  In the ED, lipase mildly elevated, ultrasound showed positive Murphy sign, cholelithiasis, but no gallbladder wall thickening or pericholecystic edema.  General surgery and GI consulted.  Patient admitted for further management.     Today, patient continues to complain of nausea, epigastric pain after eating.   Assessment/Plan: Principal Problem:   Dehydration Active Problems:   Hypertension   Bipolar disorder, in full remission, most recent episode mixed (HCC)   Pancreatitis    Intractable nausea/vomiting with epigastric pain Bleeding esophagitis with some gastritis Possible acute cholecystitis Currently afebrile, with no leukocytosis Mildly elevated lipase CT A/P unremarkable, RUQ USS showed cholelithiasis, but no gallbladder wall thickening or pericholecystic edema MRCP negative for choledocholithiasis DG abdomen, showed some gas in the colon, but no substantial stool volume to suggest clinical constipation GI on board, s/p EGD which showed LA grade B esophagitis with bleeding, gastritis, erythematous shows duodenopathy.  Biopsy pending.  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   Weight as of this encounter: 85 kg.     Code Status: Full  Family Communication: None at bedside  Disposition Plan: Status is: Inpatient The patient will require care spanning > 2 midnights and should be moved to inpatient because: Level of care      Consultants: GI General surgery  Procedures: EGD  Antimicrobials: None  DVT prophylaxis: SCD   Objective: Vitals:   05/20/22 1543 05/20/22 2000 05/21/22 0549 05/21/22 1253  BP: 137/81 110/63 115/71 (!) 114/57  Pulse: 84 78 85 95  Resp: 15 (!) 21 (!) 23 (!) 21  Temp: (!) 97.4 F (36.3 C) 98.5 F (36.9 C) 98.4 F (36.9 C) 97.9 F (36.6 C)  TempSrc: Oral Oral Oral Oral  SpO2: 94% 98% 96% 98%  Weight:      Height:       No intake or output data in the 24 hours ending 05/21/22 1723   Filed Weights   05/17/22 1444 05/17/22 1656 05/18/22 1817  Weight: 103 kg 83.7 kg 85 kg    Exam: General: NAD  Cardiovascular: S1, S2 present Respiratory: CTAB Abdomen: Soft, tender, nondistended, bowel sounds  present Musculoskeletal: No bilateral pedal edema noted Skin: Normal Psychiatry: Normal mood     Data Reviewed: CBC: Recent Labs  Lab 05/16/22 1516 05/17/22 0043 05/17/22 0447 05/18/22 0147 05/19/22 0233 05/20/22 0154 05/21/22 0152  WBC 7.1  --  8.2 4.7 4.4 3.7* 3.8*  NEUTROABS 5.2  --   --  3.4 2.8 2.3 2.2  HGB 14.4    < > 11.7* 10.7* 10.0* 10.4* 10.1*  HCT 41.8   < > 34.8* 31.3* 30.3* 31.2* 31.3*  MCV 76.8*  --  78.0* 78.8* 79.5* 78.6* 79.4*  PLT 299  --  241 199 194 180 212   < > = values in this interval not displayed.   Basic Metabolic Panel: Recent Labs  Lab 05/17/22 0039 05/17/22 0043 05/17/22 0447 05/18/22 0147 05/19/22 0233 05/20/22 0154 05/21/22 0152  NA 137   < > 134* 136 135 136 138  K 3.5   < > 3.0* 2.9* 3.1* 3.7 3.4*  CL 88*  --  92* 96* 99 100 101  CO2 29  --  24 27 26 27 27   GLUCOSE 92  --  87 75 63* 68* 75  BUN 23  --  22 13 7* 7* 5*  CREATININE 2.05*  --  1.84* 1.24* 1.05* 1.09* 1.10*  CALCIUM 9.3  --  9.0 8.7* 8.9 9.4 9.4  MG 1.4*  --  1.5* 1.8  --   --   --   PHOS 3.7  --  3.2  --   --   --   --    < > = values in this interval not displayed.   GFR: Estimated Creatinine Clearance: 49.8 mL/min (A) (by C-G formula based on SCr of 1.1 mg/dL (H)). Liver Function Tests: Recent Labs  Lab 05/17/22 0447 05/18/22 0147 05/19/22 0233 05/20/22 0154 05/21/22 0152  AST 27 20 18 15 15   ALT 17 15 15 14 14   ALKPHOS 74 65 60 63 66  BILITOT 1.4* 1.3* 1.2 0.7 0.5  PROT 6.0* 5.3* 5.0* 5.3* 5.1*  ALBUMIN 3.2* 2.9* 2.8* 2.8* 2.7*   Recent Labs  Lab 05/17/22 0447 05/18/22 0147 05/19/22 0233 05/20/22 0154 05/21/22 0152  LIPASE 210* 155* 177* 181* 188*   No results for input(s): "AMMONIA" in the last 168 hours. Coagulation Profile: Recent Labs  Lab 05/17/22 0039  INR 1.1   Cardiac Enzymes: Recent Labs  Lab 05/17/22 0039  CKTOTAL 138   BNP (last 3 results) No results for input(s): "PROBNP" in the last 8760 hours. HbA1C: No results for input(s): "HGBA1C" in the last 72 hours. CBG: No results for input(s): "GLUCAP" in the last 168 hours. Lipid Profile: No results for input(s): "CHOL", "HDL", "LDLCALC", "TRIG", "CHOLHDL", "LDLDIRECT" in the last 72 hours.  Thyroid Function Tests: No results for input(s): "TSH", "T4TOTAL", "FREET4", "T3FREE", "THYROIDAB" in the last  72 hours.  Anemia Panel: No results for input(s): "VITAMINB12", "FOLATE", "FERRITIN", "TIBC", "IRON", "RETICCTPCT" in the last 72 hours. Urine analysis:    Component Value Date/Time   COLORURINE AMBER (A) 05/17/2022 0208   APPEARANCEUR CLOUDY (A) 05/17/2022 0208   LABSPEC 1.018 05/17/2022 0208   PHURINE 5.0 05/17/2022 0208   GLUCOSEU NEGATIVE 05/17/2022 0208   HGBUR NEGATIVE 05/17/2022 0208   BILIRUBINUR SMALL (A) 05/17/2022 0208   KETONESUR 5 (A) 05/17/2022 0208   PROTEINUR 30 (A) 05/17/2022 0208   UROBILINOGEN 1.0 01/11/2010 1841   NITRITE NEGATIVE 05/17/2022 0208   LEUKOCYTESUR NEGATIVE 05/17/2022 0208   Sepsis Labs: @LABRCNTIP (procalcitonin:4,lacticidven:4)  )  Recent Results (from the past 240 hour(s))  SARS Coronavirus 2 by RT PCR (hospital order, performed in [facility] hospital lab) *cepheid single result test* Anterior Nasal Swab     Status: None   Collection Time: 05/17/22  5:00 AM   Specimen: Anterior Nasal Swab  Result Value Ref Range Status   SARS Coronavirus 2 by RT PCR NEGATIVE NEGATIVE Final    Comment: (NOTE) SARS-CoV-2 target nucleic acids are NOT DETECTED.  The SARS-CoV-2 RNA is generally detectable in upper and lower respiratory specimens during the acute phase of infection. The lowest concentration of SARS-CoV-2 viral copies this assay can detect is 250 copies / mL. A negative result does not preclude SARS-CoV-2 infection and should not be used as the sole basis for treatment or other patient management decisions.  A negative result may occur with improper specimen collection / handling, submission of specimen other than nasopharyngeal swab, presence of viral mutation(s) within the areas targeted by this assay, and inadequate number of viral copies (<250 copies / mL). A negative result must be combined with clinical observations, patient history, and epidemiological information.  Fact Sheet for Patients:    RoadLapTop.co.za  Fact Sheet for Healthcare Providers: http://kim-miller.com/  This test is not yet approved or  cleared by the [place] FDA and has been authorized for detection and/or diagnosis of SARS-CoV-2 by FDA under an Emergency Use Authorization (EUA).  This EUA will remain in effect (meaning this test can be used) for the duration of the COVID-19 declaration under Section 564(b)(1) of the Act, 21 U.S.C. section 360bbb-3(b)(1), unless the authorization is terminated or revoked sooner.  Performed at [facility], [address] [place], [place] [address]       Studies: No results found.  Scheduled Meds:  feeding supplement  1 Container Oral TID BM   latanoprost  1 drop Both Eyes QHS   multivitamin with minerals  1 tablet Oral Daily   pantoprazole  40 mg Oral BID AC   polyethylene glycol  17 g Oral Daily   risperiDONE  1 mg Oral QHS   senna-docusate  1 tablet Oral QHS   venlafaxine XR  37.5 mg Oral QHS    Continuous Infusions:     LOS: 4 days     Briant Cedar, MD [facility]  If 7PM-7AM, please contact night-coverage [email or web address] 05/21/2022, 5:23 PM

## 2022-05-22 ENCOUNTER — Other Ambulatory Visit: Payer: Self-pay

## 2022-05-22 ENCOUNTER — Encounter (HOSPITAL_COMMUNITY)
Admission: EM | Disposition: A | Discharge status: Home / Self Care | Payer: Self-pay | Source: Home / Self Care | Attending: Internal Medicine

## 2022-05-22 ENCOUNTER — Inpatient Hospital Stay (HOSPITAL_COMMUNITY): Payer: Medicare Other | Admitting: Certified Registered"

## 2022-05-22 ENCOUNTER — Encounter (HOSPITAL_COMMUNITY): Payer: Self-pay | Admitting: Internal Medicine

## 2022-05-22 DIAGNOSIS — J449 Chronic obstructive pulmonary disease, unspecified: Secondary | ICD-10-CM

## 2022-05-22 DIAGNOSIS — K8020 Calculus of gallbladder without cholecystitis without obstruction: Secondary | ICD-10-CM

## 2022-05-22 DIAGNOSIS — Z87891 Personal history of nicotine dependence: Secondary | ICD-10-CM

## 2022-05-22 DIAGNOSIS — I10 Essential (primary) hypertension: Secondary | ICD-10-CM

## 2022-05-22 HISTORY — PX: CHOLECYSTECTOMY: SHX55

## 2022-05-22 LAB — COMPREHENSIVE METABOLIC PANEL
ALT: 14 U/L (ref 0–44)
AST: 16 U/L (ref 15–41)
Albumin: 2.6 g/dL — ABNORMAL LOW (ref 3.5–5.0)
Alkaline Phosphatase: 66 U/L (ref 38–126)
Anion gap: 7 (ref 5–15)
BUN: 6 mg/dL — ABNORMAL LOW (ref 8–23)
CO2: 27 mmol/L (ref 22–32)
Calcium: 9.6 mg/dL (ref 8.9–10.3)
Chloride: 104 mmol/L (ref 98–111)
Creatinine, Ser: 1.05 mg/dL — ABNORMAL HIGH (ref 0.44–1.00)
GFR, Estimated: 56 mL/min — ABNORMAL LOW (ref >60)
Glucose, Bld: 88 mg/dL (ref 70–99)
Potassium: 3.5 mmol/L (ref 3.5–5.1)
Sodium: 138 mmol/L (ref 135–145)
Total Bilirubin: 0.5 mg/dL (ref 0.3–1.2)
Total Protein: 5.1 g/dL — ABNORMAL LOW (ref 6.5–8.1)

## 2022-05-22 LAB — CBC WITH DIFFERENTIAL/PLATELET
Abs Immature Granulocytes: 0.02 10*3/uL (ref 0.00–0.07)
Basophils Absolute: 0 10*3/uL (ref 0.0–0.1)
Basophils Relative: 1 %
Eosinophils Absolute: 0.3 10*3/uL (ref 0.0–0.5)
Eosinophils Relative: 6 %
HCT: 30.3 % — ABNORMAL LOW (ref 36.0–46.0)
Hemoglobin: 9.9 g/dL — ABNORMAL LOW (ref 12.0–15.0)
Immature Granulocytes: 1 %
Lymphocytes Relative: 28 %
Lymphs Abs: 1.1 10*3/uL (ref 0.7–4.0)
MCH: 26.1 pg (ref 26.0–34.0)
MCHC: 32.7 g/dL (ref 30.0–36.0)
MCV: 79.7 fL — ABNORMAL LOW (ref 80.0–100.0)
Monocytes Absolute: 0.5 10*3/uL (ref 0.1–1.0)
Monocytes Relative: 12 %
Neutro Abs: 2.1 10*3/uL (ref 1.7–7.7)
Neutrophils Relative %: 52 %
Platelets: 236 10*3/uL (ref 150–400)
RBC: 3.8 MIL/uL — ABNORMAL LOW (ref 3.87–5.11)
RDW: 14.4 % (ref 11.5–15.5)
WBC: 4 10*3/uL (ref 4.0–10.5)
nRBC: 0 % (ref 0.0–0.2)

## 2022-05-22 LAB — SURGICAL PCR SCREEN
MRSA, PCR: NEGATIVE
Staphylococcus aureus: NEGATIVE

## 2022-05-22 LAB — LIPASE, BLOOD: Lipase: 268 U/L — ABNORMAL HIGH (ref 11–51)

## 2022-05-22 SURGERY — LAPAROSCOPIC CHOLECYSTECTOMY WITH INTRAOPERATIVE CHOLANGIOGRAM
Anesthesia: General | Site: Abdomen

## 2022-05-22 MED ORDER — DEXAMETHASONE SODIUM PHOSPHATE 10 MG/ML IJ SOLN
INTRAMUSCULAR | Status: AC
  Filled 2022-05-22: qty 1

## 2022-05-22 MED ORDER — OXYCODONE HCL 5 MG/5ML PO SOLN: 5.0000 mg | Freq: Once | ORAL | Status: DC | PRN

## 2022-05-22 MED ORDER — AMISULPRIDE (ANTIEMETIC) 5 MG/2ML IV SOLN: 10.0000 mg | Freq: Once | INTRAVENOUS | Status: DC | PRN

## 2022-05-22 MED ORDER — OXYCODONE HCL 5 MG PO TABS: 5.0000 mg | ORAL_TABLET | Freq: Once | ORAL | Status: DC | PRN

## 2022-05-22 MED ORDER — BUPIVACAINE-EPINEPHRINE (PF) 0.25% -1:200000 IJ SOLN
INTRAMUSCULAR | Status: AC
  Filled 2022-05-22: qty 30

## 2022-05-22 MED ORDER — MIDAZOLAM HCL 2 MG/2ML IJ SOLN
INTRAMUSCULAR | Status: DC | PRN
  Administered 2022-05-22 (×2): 1 mg via INTRAVENOUS

## 2022-05-22 MED ORDER — CIPROFLOXACIN IN D5W 400 MG/200ML IV SOLN
400.0000 mg | INTRAVENOUS | Status: AC
  Administered 2022-05-22: 400 mg via INTRAVENOUS
  Filled 2022-05-22: qty 200

## 2022-05-22 MED ORDER — LIDOCAINE 2% (20 MG/ML) 5 ML SYRINGE
INTRAMUSCULAR | Status: DC | PRN
  Administered 2022-05-22: 100 mg via INTRAVENOUS

## 2022-05-22 MED ORDER — PROPOFOL 10 MG/ML IV BOLUS
INTRAVENOUS | Status: DC | PRN
  Administered 2022-05-22: 40 mg via INTRAVENOUS
  Administered 2022-05-22: 160 mg via INTRAVENOUS

## 2022-05-22 MED ORDER — HYDROMORPHONE HCL 1 MG/ML IJ SOLN
0.2500 mg | INTRAMUSCULAR | Status: DC | PRN
  Administered 2022-05-22: 0.5 mg via INTRAVENOUS
  Administered 2022-05-22 (×2): 0.25 mg via INTRAVENOUS

## 2022-05-22 MED ORDER — 0.9 % SODIUM CHLORIDE (POUR BTL) OPTIME
TOPICAL | Status: DC | PRN
  Administered 2022-05-22: 1000 mL

## 2022-05-22 MED ORDER — CHLORHEXIDINE GLUCONATE 0.12 % MT SOLN
15.0000 mL | Freq: Once | OROMUCOSAL | Status: AC
  Administered 2022-05-22: 15 mL via OROMUCOSAL

## 2022-05-22 MED ORDER — HYDROMORPHONE HCL 1 MG/ML IJ SOLN
1.0000 mg | INTRAMUSCULAR | Status: DC | PRN
  Administered 2022-05-22 – 2022-05-23 (×3): 1 mg via INTRAVENOUS
  Filled 2022-05-22 (×3): qty 1

## 2022-05-22 MED ORDER — LIDOCAINE 2% (20 MG/ML) 5 ML SYRINGE
INTRAMUSCULAR | Status: AC
  Filled 2022-05-22: qty 5

## 2022-05-22 MED ORDER — FENTANYL CITRATE (PF) 250 MCG/5ML IJ SOLN
INTRAMUSCULAR | Status: DC | PRN
  Administered 2022-05-22 (×3): 50 ug via INTRAVENOUS
  Administered 2022-05-22: 100 ug via INTRAVENOUS

## 2022-05-22 MED ORDER — ONDANSETRON HCL 4 MG/2ML IJ SOLN
INTRAMUSCULAR | Status: AC
  Filled 2022-05-22: qty 2

## 2022-05-22 MED ORDER — HYDROMORPHONE HCL 1 MG/ML IJ SOLN
INTRAMUSCULAR | Status: AC
  Filled 2022-05-22: qty 1

## 2022-05-22 MED ORDER — BUPIVACAINE-EPINEPHRINE 0.25% -1:200000 IJ SOLN
INTRAMUSCULAR | Status: DC | PRN
  Administered 2022-05-22: 20 mL

## 2022-05-22 MED ORDER — ROCURONIUM BROMIDE 10 MG/ML (PF) SYRINGE
PREFILLED_SYRINGE | INTRAVENOUS | Status: AC
  Filled 2022-05-22: qty 10

## 2022-05-22 MED ORDER — KETOROLAC TROMETHAMINE 30 MG/ML IJ SOLN
INTRAMUSCULAR | Status: AC
  Filled 2022-05-22: qty 1

## 2022-05-22 MED ORDER — ROCURONIUM BROMIDE 10 MG/ML (PF) SYRINGE
PREFILLED_SYRINGE | INTRAVENOUS | Status: DC | PRN
  Administered 2022-05-22: 80 mg via INTRAVENOUS

## 2022-05-22 MED ORDER — SODIUM CHLORIDE 0.9 % IR SOLN
Status: DC | PRN
  Administered 2022-05-22: 1000 mL

## 2022-05-22 MED ORDER — MIDAZOLAM HCL 2 MG/2ML IJ SOLN
INTRAMUSCULAR | Status: AC
  Filled 2022-05-22: qty 2

## 2022-05-22 MED ORDER — FENTANYL CITRATE (PF) 250 MCG/5ML IJ SOLN
INTRAMUSCULAR | Status: AC
  Filled 2022-05-22: qty 5

## 2022-05-22 MED ORDER — ORAL CARE MOUTH RINSE: 15.0000 mL | Freq: Once | OROMUCOSAL | Status: AC

## 2022-05-22 MED ORDER — DEXAMETHASONE SODIUM PHOSPHATE 10 MG/ML IJ SOLN
INTRAMUSCULAR | Status: DC | PRN
  Administered 2022-05-22: 4 mg via INTRAVENOUS

## 2022-05-22 MED ORDER — ONDANSETRON HCL 4 MG/2ML IJ SOLN
INTRAMUSCULAR | Status: DC | PRN
  Administered 2022-05-22: 4 mg via INTRAVENOUS

## 2022-05-22 MED ORDER — SUGAMMADEX SODIUM 200 MG/2ML IV SOLN
INTRAVENOUS | Status: DC | PRN
  Administered 2022-05-22: 200 mg via INTRAVENOUS

## 2022-05-22 MED ORDER — METOPROLOL TARTRATE 5 MG/5ML IV SOLN
INTRAVENOUS | Status: DC | PRN
  Administered 2022-05-22: 2 mg via INTRAVENOUS
  Administered 2022-05-22: 3 mg via INTRAVENOUS

## 2022-05-22 MED ORDER — LACTATED RINGERS IV SOLN: INTRAVENOUS | Status: DC

## 2022-05-22 MED ORDER — DEXMEDETOMIDINE HCL IN NACL 80 MCG/20ML IV SOLN
INTRAVENOUS | Status: AC
  Filled 2022-05-22: qty 20

## 2022-05-22 MED ORDER — PROPOFOL 10 MG/ML IV BOLUS
INTRAVENOUS | Status: AC
  Filled 2022-05-22: qty 20

## 2022-05-22 MED ORDER — PROMETHAZINE HCL 25 MG/ML IJ SOLN: 6.2500 mg | INTRAMUSCULAR | Status: DC | PRN

## 2022-05-22 MED ORDER — DEXMEDETOMIDINE HCL IN NACL 80 MCG/20ML IV SOLN
INTRAVENOUS | Status: DC | PRN
  Administered 2022-05-22: 8 ug via BUCCAL
  Administered 2022-05-22: 4 ug via BUCCAL
  Administered 2022-05-22: 8 ug via BUCCAL

## 2022-05-22 SURGICAL SUPPLY — 40 items
ADH SKN CLS APL DERMABOND .7 (GAUZE/BANDAGES/DRESSINGS) ×1
APL PRP STRL LF DISP 70% ISPRP (MISCELLANEOUS) ×1
APPLIER CLIP 5 13 M/L LIGAMAX5 (MISCELLANEOUS) ×1
APR CLP MED LRG 5 ANG JAW (MISCELLANEOUS) ×1
BAG COUNTER SPONGE SURGICOUNT (BAG) ×1 IMPLANT
BAG SPEC RTRVL LRG 6X4 10 (ENDOMECHANICALS) ×1
BAG SPNG CNTER NS LX DISP (BAG) ×1
BLADE CLIPPER SURG (BLADE) IMPLANT
CANISTER SUCT 3000ML PPV (MISCELLANEOUS) ×1 IMPLANT
CHLORAPREP W/TINT 26 (MISCELLANEOUS) ×1 IMPLANT
CLIP APPLIE 5 13 M/L LIGAMAX5 (MISCELLANEOUS) ×1 IMPLANT
COVER MAYO STAND STRL (DRAPES) IMPLANT
COVER SURGICAL LIGHT HANDLE (MISCELLANEOUS) ×1 IMPLANT
DERMABOND ADVANCED .7 DNX12 (GAUZE/BANDAGES/DRESSINGS) ×1 IMPLANT
DRAPE C-ARM 42X120 X-RAY (DRAPES) IMPLANT
ELECT REM PT RETURN 9FT ADLT (ELECTROSURGICAL) ×1
ELECTRODE REM PT RTRN 9FT ADLT (ELECTROSURGICAL) ×1 IMPLANT
GLOVE SURG SIGNA 7.5 PF LTX (GLOVE) ×1 IMPLANT
GOWN STRL REUS W/ TWL LRG LVL3 (GOWN DISPOSABLE) ×2 IMPLANT
GOWN STRL REUS W/ TWL XL LVL3 (GOWN DISPOSABLE) ×1 IMPLANT
GOWN STRL REUS W/TWL LRG LVL3 (GOWN DISPOSABLE) ×2
GOWN STRL REUS W/TWL XL LVL3 (GOWN DISPOSABLE) ×1
KIT BASIN OR (CUSTOM PROCEDURE TRAY) ×1 IMPLANT
KIT TURNOVER KIT B (KITS) ×1 IMPLANT
NS IRRIG 1000ML POUR BTL (IV SOLUTION) ×1 IMPLANT
PAD ARMBOARD 7.5X6 YLW CONV (MISCELLANEOUS) ×1 IMPLANT
POUCH SPECIMEN RETRIEVAL 10MM (ENDOMECHANICALS) ×1 IMPLANT
SCISSORS LAP 5X35 DISP (ENDOMECHANICALS) ×1 IMPLANT
SET CHOLANGIOGRAPH 5 50 .035 (SET/KITS/TRAYS/PACK) IMPLANT
SET IRRIG TUBING LAPAROSCOPIC (IRRIGATION / IRRIGATOR) ×1 IMPLANT
SET TUBE SMOKE EVAC HIGH FLOW (TUBING) ×1 IMPLANT
SLEEVE ENDOPATH XCEL 5M (ENDOMECHANICALS) ×2 IMPLANT
SPECIMEN JAR SMALL (MISCELLANEOUS) ×1 IMPLANT
SUT MNCRL AB 4-0 PS2 18 (SUTURE) ×1 IMPLANT
TOWEL GREEN STERILE (TOWEL DISPOSABLE) ×1 IMPLANT
TOWEL GREEN STERILE FF (TOWEL DISPOSABLE) ×1 IMPLANT
TRAY LAPAROSCOPIC MC (CUSTOM PROCEDURE TRAY) ×1 IMPLANT
TROCAR XCEL BLUNT TIP 100MML (ENDOMECHANICALS) ×1 IMPLANT
TROCAR Z-THREAD OPTICAL 5X100M (TROCAR) ×1 IMPLANT
WATER STERILE IRR 1000ML POUR (IV SOLUTION) ×1 IMPLANT

## 2022-05-22 NOTE — Discharge Instructions (Signed)
[facility]  Please arrive at least 30 min before your appointment to complete your check in paperwork.  If you are unable to arrive 30 min prior to your appointment time we may have to cancel or reschedule you. LAPAROSCOPIC SURGERY: POST OP INSTRUCTIONS Always review your discharge instruction sheet given to you by the facility where your surgery was performed. IF YOU HAVE DISABILITY OR FAMILY LEAVE FORMS, YOU MUST BRING THEM TO THE OFFICE FOR PROCESSING.   DO NOT GIVE THEM TO YOUR DOCTOR.  PAIN CONTROL  First take acetaminophen (Tylenol) AND/or ibuprofen (Advil) to control your pain after surgery.  Follow directions on package.  Taking acetaminophen (Tylenol) and/or ibuprofen (Advil) regularly after surgery will help to control your pain and lower the amount of prescription pain medication you may need.  You should not take more than 4,000 mg (4 grams) of acetaminophen (Tylenol) in 24 hours.  You should not take ibuprofen (Advil), aleve, motrin, naprosyn or other NSAIDS if you have a history of stomach ulcers or chronic kidney disease.  A prescription for pain medication may be given to you upon discharge.  Take your pain medication as prescribed, if you still have uncontrolled pain after taking acetaminophen (Tylenol) or ibuprofen (Advil). Use ice packs to help control pain. If you need a refill on your pain medication, please contact your pharmacy.  They will contact our office to request authorization. Prescriptions will not be filled after 5pm or on week-ends.  HOME MEDICATIONS Take your usually prescribed medications unless otherwise directed.  DIET You should follow a light diet the first few days after arrival home.  Be sure to include lots of fluids daily. Avoid fatty, fried foods.   CONSTIPATION It is common to experience some constipation after surgery and if you are taking pain medication.  Increasing fluid intake and taking a stool softener (such as Colace)  will usually help or prevent this problem from occurring.  A mild laxative (Milk of Magnesia or Miralax) should be taken according to package instructions if there are no bowel movements after 48 hours.  WOUND/INCISION CARE Most patients will experience some swelling and bruising in the area of the incisions.  Ice packs will help.  Swelling and bruising can take several days to resolve.  Unless discharge instructions indicate otherwise, follow guidelines below  STERI-STRIPS - you may remove your outer bandages 48 hours after surgery, and you may shower at that time.  You have steri-strips (small skin tapes) in place directly over the incision.  These strips should be left on the skin for 7-10 days.   DERMABOND/SKIN GLUE - you may shower in 24 hours.  The glue will flake off over the next 2-3 weeks. Any sutures or staples will be removed at the office during your follow-up visit.  ACTIVITIES You may resume regular (light) daily activities beginning the next day--such as daily self-care, walking, climbing stairs--gradually increasing activities as tolerated.  You may have sexual intercourse when it is comfortable.  Refrain from any heavy lifting or straining until approved by your doctor. You may drive when you are no longer taking prescription pain medication, you can comfortably wear a seatbelt, and you can safely maneuver your car and apply brakes.  FOLLOW-UP You should see your doctor in the office for a follow-up appointment approximately 2-3 weeks after your surgery.  You should have been given your post-op/follow-up appointment when your surgery was scheduled.  If you did not receive a post-op/follow-up appointment, make sure   that you call for this appointment within a day or two after you arrive home to insure a convenient appointment time.  OTHER INSTRUCTIONS  WHEN TO CALL YOUR DOCTOR: Fever over 101.0 Inability to urinate Continued bleeding from incision. Increased pain, redness, or  drainage from the incision. Increasing abdominal pain  The clinic staff is available to answer your questions during regular business hours.  Please don't hesitate to call and ask to speak to one of the nurses for clinical concerns.  If you have a medical emergency, go to the nearest emergency room or call 911.  A surgeon from [facility] is always on call at the hospital. [address], [address], [place], [place]  [address] ? [address] [address], [place], [place]   [address] [phone] ? [phone] ? [phone]   

## 2022-05-22 NOTE — Transfer of Care (Signed)
Immediate Anesthesia Transfer of Care Note  Patient: Stephanie Benton  Procedure(s) Performed: LAPAROSCOPIC CHOLECYSTECTOMY WITH INTRAOPERATIVE CHOLANGIOGRAM (Abdomen)  Patient Location: PACU  Anesthesia Type:General  Level of Consciousness: drowsy and patient cooperative  Airway & Oxygen Therapy: Patient Spontanous Breathing  Post-op Assessment: Report given to RN and Post -op Vital signs reviewed and stable  Post vital signs: Reviewed and stable  Last Vitals:  Vitals Value Taken Time  BP    Temp    Pulse 100 05/22/22 1258  Resp 17 05/22/22 1258  SpO2 94 % 05/22/22 1258  Vitals shown include unvalidated device data.  Last Pain:  Vitals:   05/22/22 1005  TempSrc:   PainSc: 6       Patients Stated Pain Goal: 1 (92/44/62 8638)  Complications: No notable events documented.

## 2022-05-22 NOTE — Plan of Care (Signed)

## 2022-05-22 NOTE — Anesthesia Postprocedure Evaluation (Signed)
Anesthesia Post Note  Patient: Stephanie Benton  Procedure(s) Performed: LAPAROSCOPIC CHOLECYSTECTOMY WITH INTRAOPERATIVE CHOLANGIOGRAM (Abdomen)     Patient location during evaluation: PACU Anesthesia Type: General Level of consciousness: awake and alert Pain management: pain level controlled Vital Signs Assessment: post-procedure vital signs reviewed and stable Respiratory status: spontaneous breathing, nonlabored ventilation and respiratory function stable Cardiovascular status: blood pressure returned to baseline and stable Postop Assessment: no apparent nausea or vomiting Anesthetic complications: no   No notable events documented.  Last Vitals:  Vitals:   05/22/22 1347 05/22/22 1412  BP: 101/81 (!) 74/60  Pulse: 76 71  Resp: 14 18  Temp:  (!) 36.3 C  SpO2: (!) 88% 91%    Last Pain:  Vitals:   05/22/22 1412  TempSrc: Oral  PainSc:                  Lynda Rainwater

## 2022-05-22 NOTE — Op Note (Signed)
  Stephanie Benton 05/16/2022 - 05/22/2022   Pre-op Diagnosis: symptomatic gallstones     Post-op Diagnosis: same  Procedure(s): LAPAROSCOPIC CHOLECYSTECTOMY   Surgeon(s): Coralie Keens, MD  Assist: Lossie Faes MD [facility] Resident  Anesthesia: General  Staff:  Circulator: Hal Morales, RN; Leia Alf, RN Relief Circulator: Catha Nottingham, RN Scrub Person: Larna Daughters, RN  Estimated Blood Loss: Minimal               Specimens: sent to path  Findings: The patient had a thick-walled gallbladder consistent with chronic cholecystitis with small gallstones and thick sludge  Procedure: The patient was brought to operating identifies correct patient.  She was placed upon the operating table and general anesthesia was induced.  Her abdomen was then prepped and draped in usual sterile fashion.  We then made a small vertical incision below the umbilicus with a scalpel.  We carried this down to the fascia which was then opened the scalpel as well.  We then opened the peritoneum with a Kelly clamp under direct vision.  We placed a 0 Vicryl pursestring suture around the fascial opening.  The [facility] port was placed to the opening and insufflation of the abdomen was begun.  The patient was found to have adhesions of omentum to the upper midline.  This was from her previous laparoscopic hernia repair with mesh.  We could easily visualize the right upper quadrant of the abdomen so we left these adhesions intact. We placed a 5 mm trocar to the right of the midline under direct vision and then 2 more in the right upper quadrant also under direct vision.  The gallbladder was identified and found to be thick-walled consistent with chronic cholecystitis.  He had multiple adhesions to the gallbladder.  We were able to easily elevate the gallbladder and take down adhesions bluntly.  The cystic artery and cystic duct were then dissected out and a critical window was achieved around  both.  The gallbladder was quite friable and started to tear so we decided to forego cholangiogram.  The cystic duct was clipped 3 times proximally once distally and transected.  The cystic artery and posterior branch were then clipped proximally distally and transected as well.  The gallbladder was then slowly dissected free from the liver bed with the cautery.  Once it was freed from the liver bed was placed in an Endosac and removed through the incision at the umbilicus.  We tied the 0 Vicryl at the umbilicus in place closing the fascial defect.  We then copiously irrigated the right upper quadrant with normal saline.  Again hemostasis peer to be achieved and liver bed.  All ports were then removed under direct vision the abdomen was deflated.  I next attempted to aspirate her calcified small seroma in the abdominal wall but was unable to get any fluid out.  All incisions were anesthetized Marcaine and closed with 4-0 Monocryl sutures.  Dermabond was then applied.  The patient tolerated the procedure well.  All the counts were correct at the end of the procedure.  The patient was then extubated in the operating room and taken in stable condition to the recovery room.          Coralie Keens   Date: 05/22/2022  Time: 12:39 PM

## 2022-05-22 NOTE — Anesthesia Preprocedure Evaluation (Signed)
Anesthesia Evaluation  Patient identified by MRN, date of birth, ID band Patient awake    Reviewed: Allergy & Precautions, NPO status , Patient's Chart, lab work & pertinent test results  History of Anesthesia Complications Negative for: history of anesthetic complications  Airway Mallampati: I  TM Distance: >3 FB Neck ROM: Full    Dental  (+) Edentulous Upper, Edentulous Lower, Dental Advisory Given   Pulmonary COPD, former smoker,    Pulmonary exam normal        Cardiovascular hypertension, Pt. on medications Normal cardiovascular exam  Echo 05/17/2022 IMPRESSIONS   1. Left ventricular ejection fraction, by estimation, is 60 to 65%. The left ventricle has normal function. The left ventricle has no regional wall motion abnormalities. There is mild left ventricular hypertrophy. Left ventricular diastolic parameters  are consistent with Grade I diastolic dysfunction (impaired relaxation). 2. Right ventricular systolic function is normal. The right ventricular size is normal. 3. The mitral valve is normal in structure. No evidence of mitral valve regurgitation. 4. There is mild calcification of the aortic valve. Aortic valve regurgitation is not visualized. 5. The inferior vena cava is normal in size with greater than 50% respiratory variability, suggesting right atrial pressure of 3 mmHg.   Neuro/Psych PSYCHIATRIC DISORDERS Anxiety Depression Bipolar Disorder negative neurological ROS     GI/Hepatic negative GI ROS, Neg liver ROS,   Endo/Other  negative endocrine ROS  Renal/GU Renal InsufficiencyRenal disease     Musculoskeletal  (+) Arthritis , Osteoarthritis,    Abdominal   Peds  Hematology  (+) Blood dyscrasia, anemia ,   Anesthesia Other Findings   Reproductive/Obstetrics                             Anesthesia Physical  Anesthesia Plan  ASA: 3  Anesthesia Plan: General    Post-op Pain Management: Ofirmev IV (intra-op)*   Induction: Intravenous  PONV Risk Score and Plan: 2 and Ondansetron, Midazolam and Treatment may vary due to age or medical condition  Airway Management Planned: Oral ETT  Additional Equipment:   Intra-op Plan:   Post-operative Plan: Extubation in OR  Informed Consent: I have reviewed the patients History and Physical, chart, labs and discussed the procedure including the risks, benefits and alternatives for the proposed anesthesia with the patient or authorized representative who has indicated his/her understanding and acceptance.     Dental advisory given  Plan Discussed with: Anesthesiologist and CRNA  Anesthesia Plan Comments:         Anesthesia Quick Evaluation

## 2022-05-22 NOTE — Progress Notes (Signed)
5 Days Post-Op   Subjective/Chief Complaint: Still having epigastric pain and RUQ pain with meals   Objective: Vital signs in last 24 hours: Temp:  [97.9 F (36.6 C)] 97.9 F (36.6 C) (10/18 0349) Pulse Rate:  [95-104] 102 (10/18 0349) Resp:  [18-24] 24 (10/18 0349) BP: (114-130)/(57-80) 130/74 (10/18 0349) SpO2:  [97 %-98 %] 98 % (10/18 0349) Last BM Date :  (patient states she has not had BM for 2 weeks.)  Intake/Output from previous day: No intake/output data recorded. Intake/Output this shift: No intake/output data recorded.  Exam: Awake and alert Abdomen soft, tender in the epigastrium.  Small palpable chronic seroma  Lab Results:  Recent Labs    05/21/22 0152 05/22/22 0205  WBC 3.8* 4.0  HGB 10.1* 9.9*  HCT 31.3* 30.3*  PLT 212 236   BMET Recent Labs    05/21/22 0152 05/22/22 0205  NA 138 138  K 3.4* 3.5  CL 101 104  CO2 27 27  GLUCOSE 75 88  BUN 5* 6*  CREATININE 1.10* 1.05*  CALCIUM 9.4 9.6   PT/INR No results for input(s): "LABPROT", "INR" in the last 72 hours. ABG No results for input(s): "PHART", "HCO3" in the last 72 hours.  Invalid input(s): "PCO2", "PO2"  Studies/Results: DG Abd Portable 1V  Result Date: 05/20/2022 CLINICAL DATA:  Constipation. EXAM: PORTABLE ABDOMEN - 1 VIEW COMPARISON:  Abdomen pelvis CT 05/09/2022 FINDINGS: Scattered small bowel gas evident in a nonobstructive pattern. Air is seen scattered along the course of a mildly distended colon. No substantial stool volume. Rectum appears largely free of stool. Sequelae of ventral mesh placement noted. IMPRESSION: Nonobstructive bowel gas pattern. There is some gas in the colon, but no substantial stool volume to suggest clinical constipation. Electronically Signed   By: Misty Stanley M.D.   On: 05/20/2022 08:12    Anti-infectives: Anti-infectives (From admission, onward)    None       Assessment/Plan: 71 y/o F who presents with 3 weeks of poor PO intake, including  intolerance of liquids, and vomiting.    - EGD 10/13 with esophagitis, gastritis; treated with Carafate x 2 doses without improvement so GI stopped this medication  She still continues to have persistent epigastric and right upper quadrant abdominal pain.  Her lipase is still elevated.  Her ultrasound does show gallstones and sludge.  Her symptoms are more consistent with biliary disease.  Her LFTs remain normal.  Her MRI showed no substantial pancreatic edema.  At this point, with her failure to improve, we discussed proceeding with a laparoscopic cholecystectomy and cholangiogram.  I explained the procedure to her in detail.  We discussed the risks which includes but is not limited to bleeding, infection, injury to surrounding structures, bile duct injury, bile leak, the need to convert to an open procedure, the chance this may not resolve her symptoms, cardiopulmonary issues, DVT, etc.  She understands and wishes to proceed with surgery which will be scheduled for today.  I have also discussed this with her granddaughter by phone who agrees.     Coralie Keens 05/22/2022

## 2022-05-22 NOTE — Progress Notes (Signed)
PROGRESS NOTE  Stephanie Benton DDU:[id number] DOB: 1951/06/30 DOA: 05/16/2022 PCP: Sharmon Revere, MD  HPI/Recap of past 24 hours: Stephanie Benton is Stephanie Benton 71 y.o. female with medical history significant of HTN, HLD, asthma, glaucoma, GERD, presented with intermittent, worsening nausea, vomiting, and epigastric pain on going for the past 2 weeks. Pt was seen about Tionne Dayhoff week ago of which CTA of her chest and CT of abdomen was unremarkable, noted elevated lipase. Pt was discharged, but never got better. Pt went to her primary care and was told to go to ED due to abnormal labs. Patient does use BC powders daily, denies any other NSAID use.  In the ED, lipase mildly elevated, ultrasound showed positive Murphy sign, cholelithiasis, but no gallbladder wall thickening or pericholecystic edema.  General surgery and GI consulted.  Patient admitted for further management.  Assessment/Plan: Principal Problem:   Dehydration Active Problems:   Hypertension   Bipolar disorder, in full remission, most recent episode mixed (HCC)   Pancreatitis    Intractable nausea/vomiting with epigastric pain Bleeding esophagitis with some gastritis Possible acute cholecystitis Currently afebrile, with no leukocytosis Mildly elevated lipase CT Lyndal Alamillo/P unremarkable, RUQ USS showed cholelithiasis, but no gallbladder wall thickening or pericholecystic edema MRCP negative for choledocholithiasis DG abdomen, showed some gas in the colon, but no substantial stool volume to suggest clinical constipation GI on board, s/p EGD which showed LA grade B esophagitis with bleeding, gastritis, erythematous shows duodenopathy.  Biopsy pending.  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   Weight as of this encounter: 84.8 kg.     Code Status: Full  Family Communication: None at bedside  Disposition Plan: Status is: Inpatient The patient will require care spanning > 2 midnights and should be moved to inpatient because: Level of care  Consultants: GI General surgery  Procedures: EGD  Antimicrobials: None  DVT prophylaxis: SCD  Subjective: c/o pain post op   Objective: Vitals:   05/22/22 1347 05/22/22 1412 05/22/22 1654 05/22/22 1939  BP: 101/81 (!) 74/60 117/69 105/73  Pulse: 76 71 73 67  Resp: 14 18  16   Temp:  (!) 97.4 F (36.3 C)  (!) 97.4 F (36.3 C)  TempSrc:  Oral  Oral  SpO2: (!) 88% 91% 96% 92%  Weight:      Height:        Intake/Output Summary (Last 24 hours) at 05/22/2022 2028 Last data filed at 05/22/2022 1247 Gross per 24 hour  Intake 1200 ml  Output 20 ml  Net 1180 ml     Filed Weights   05/17/22 1656 05/18/22 1817 05/22/22 0954  Weight: 83.7 kg 85 kg 84.8 kg    Exam: General: No acute distress. Cardiovascular: RRR Lungs: unlabored Abdomen: appropriate post op TTP, lap sites intact Neurological: Alert and  oriented 3. Moves all extremities 4 . Cranial nerves II through XII grossly intact. Extremities: No clubbing or cyanosis. No edema.  Data Reviewed: CBC: Recent Labs  Lab 05/18/22 0147 05/19/22 0233 05/20/22 0154 05/21/22 0152 05/22/22 0205  WBC 4.7 4.4 3.7* 3.8* 4.0  NEUTROABS 3.4 2.8 2.3 2.2 2.1  HGB 10.7* 10.0* 10.4* 10.1* 9.9*   HCT 31.3* 30.3* 31.2* 31.3* 30.3*  MCV 78.8* 79.5* 78.6* 79.4* 79.7*  PLT 199 194 180 212 236   Basic Metabolic Panel: Recent Labs  Lab 05/17/22 0039 05/17/22 0043 05/17/22 0447 05/18/22 0147 05/19/22 0233 05/20/22 0154 05/21/22 0152 05/22/22 0205  NA 137   < > 134* 136 135 136 138 138  K 3.5   < > 3.0* 2.9* 3.1* 3.7 3.4* 3.5  CL 88*  --  92* 96* 99 100 101 104  CO2 29  --  24 27 26 27 27 27   GLUCOSE 92  --  87 75 63* 68* 75 88  BUN 23  --  22 13 7* 7* 5* 6*  CREATININE 2.05*  --  1.84* 1.24* 1.05* 1.09* 1.10* 1.05*  CALCIUM 9.3  --  9.0 8.7* 8.9 9.4 9.4 9.6  MG 1.4*  --  1.5* 1.8  --   --   --   --   PHOS 3.7  --  3.2  --   --   --   --   --    < > = values in this interval not displayed.   GFR: Estimated Creatinine Clearance: 52.1 mL/min (Josiyah Tozzi) (by C-G formula based on SCr of 1.05 mg/dL (H)). Liver Function Tests: Recent Labs  Lab 05/18/22 0147 05/19/22 0233 05/20/22 0154 05/21/22 0152 05/22/22 0205  AST 20 18 15 15 16   ALT 15 15 14 14 14   ALKPHOS 65 60 63 66 66  BILITOT 1.3* 1.2 0.7 0.5 0.5  PROT 5.3* 5.0* 5.3* 5.1* 5.1*  ALBUMIN 2.9* 2.8* 2.8* 2.7* 2.6*   Recent Labs  Lab 05/18/22 0147 05/19/22 0233 05/20/22 0154 05/21/22 0152 05/22/22 0205  LIPASE 155* 177* 181* 188* 268*   No results for input(s): "AMMONIA" in the last 168 hours. Coagulation Profile: Recent Labs  Lab 05/17/22 0039  INR 1.1   Cardiac Enzymes: Recent Labs  Lab 05/17/22 0039  CKTOTAL 138   BNP (last 3 results) No results for input(s): "PROBNP" in the last 8760 hours. HbA1C: No results for input(s): "HGBA1C" in the last 72 hours. CBG: No results for input(s): "GLUCAP" in the last 168 hours. Lipid Profile: No results for input(s): "CHOL", "HDL", "LDLCALC", "TRIG", "CHOLHDL", "LDLDIRECT" in the last 72 hours.  Thyroid Function Tests: No results for input(s): "TSH", "T4TOTAL", "FREET4", "T3FREE", "THYROIDAB" in the last 72 hours.  Anemia Panel: No results for input(s):  "VITAMINB12", "FOLATE", "FERRITIN", "TIBC", "IRON", "RETICCTPCT" in the last 72 hours. Urine analysis:    Component Value Date/Time   COLORURINE AMBER (Jeral Zick) 05/17/2022 0208   APPEARANCEUR CLOUDY (Jadakiss Barish) 05/17/2022 0208   LABSPEC 1.018 05/17/2022 0208   PHURINE 5.0 05/17/2022 0208   GLUCOSEU NEGATIVE 05/17/2022 0208   HGBUR NEGATIVE 05/17/2022 0208   BILIRUBINUR SMALL (Alcie Runions) 05/17/2022 0208   KETONESUR 5 (Devlynn Knoff) 05/17/2022 0208   PROTEINUR 30 (Marva Hendryx) 05/17/2022 0208   UROBILINOGEN 1.0 01/11/2010 1841   NITRITE NEGATIVE 05/17/2022 0208   LEUKOCYTESUR NEGATIVE 05/17/2022 0208   Sepsis Labs: @LABRCNTIP (procalcitonin:4,lacticidven:4)  ) Recent Results (from the past 240 hour(s))  SARS Coronavirus 2 by RT PCR (hospital order, performed in [facility] hospital  lab) *cepheid single result test* Anterior Nasal Swab     Status: None   Collection Time: 05/17/22  5:00 AM   Specimen: Anterior Nasal Swab  Result Value Ref Range Status   SARS Coronavirus 2 by RT PCR NEGATIVE NEGATIVE Final    Comment: (NOTE) SARS-CoV-2 target nucleic acids are NOT DETECTED.  The SARS-CoV-2 RNA is generally detectable in upper and lower respiratory specimens during the acute phase of infection. The lowest concentration of SARS-CoV-2 viral copies this assay can detect is 250 copies / mL. Shanell Aden negative result does not preclude SARS-CoV-2 infection and should not be used as the sole basis for treatment or other patient management decisions.  Eyleen Rawlinson negative result may occur with improper specimen collection / handling, submission of specimen other than nasopharyngeal swab, presence of viral mutation(s) within the areas targeted by this assay, and inadequate number of viral copies (<250 copies / mL). Averill Winters negative result must be combined with clinical observations, patient history, and epidemiological information.  Fact Sheet for Patients:   https://www.patel.info/  Fact Sheet for Healthcare  Providers: https://hall.com/  This test is not yet approved or  cleared by the [place] FDA and has been authorized for detection and/or diagnosis of SARS-CoV-2 by FDA under an Emergency Use Authorization (EUA).  This EUA will remain in effect (meaning this test can be used) for the duration of the COVID-19 declaration under Section 564(b)(1) of the Act, 21 U.S.C. section 360bbb-3(b)(1), unless the authorization is terminated or revoked sooner.  Performed at [facility], [place] [address], [place], [place] [address]   Surgical PCR screen     Status: None   Collection Time: 05/21/22 11:20 PM   Specimen: Nasal Mucosa; Nasal Swab  Result Value Ref Range Status   MRSA, PCR NEGATIVE NEGATIVE Final   Staphylococcus aureus NEGATIVE NEGATIVE Final    Comment: (NOTE) The Xpert SA Assay (FDA approved for NASAL specimens in patients 75 years of age and older), is one component of Malcome Ambrocio comprehensive surveillance program. It is not intended to diagnose infection nor to guide or monitor treatment. Performed at [facility], [place] [address], [place], [place] [address]       Studies: No results found.  Scheduled Meds:  feeding supplement  1 Container Oral TID BM   HYDROmorphone       latanoprost  1 drop Both Eyes QHS   multivitamin with minerals  1 tablet Oral Daily   mupirocin ointment  1 Application Nasal BID   pantoprazole  40 mg Oral BID AC   polyethylene glycol  17 g Oral Daily   risperiDONE  1 mg Oral QHS   senna-docusate  1 tablet Oral QHS   venlafaxine XR  37.5 mg Oral QHS    Continuous Infusions:     LOS: 5 days     Fayrene Helper, MD [facility]  If 7PM-7AM, please contact night-coverage [email or web address] 05/22/2022, 8:28 PM

## 2022-05-22 NOTE — Anesthesia Procedure Notes (Signed)
Procedure Name: Intubation Date/Time: 05/22/2022 11:47 AM  Performed by: Lance Coon, CRNAPre-anesthesia Checklist: Patient identified, Emergency Drugs available, Suction available and Patient being monitored Patient Re-evaluated:Patient Re-evaluated prior to induction Oxygen Delivery Method: Circle system utilized Preoxygenation: Pre-oxygenation with 100% oxygen Induction Type: IV induction Ventilation: Mask ventilation without difficulty Laryngoscope Size: Miller and 3 Grade View: Grade I Tube type: Oral Tube size: 7.0 mm Number of attempts: 1 Airway Equipment and Method: Stylet and Oral airway Placement Confirmation: ETT inserted through vocal cords under direct vision, positive ETCO2 and breath sounds checked- equal and bilateral Secured at: 21 cm Tube secured with: Tape Dental Injury: Teeth and Oropharynx as per pre-operative assessment

## 2022-05-22 NOTE — Progress Notes (Signed)
Occupational Therapy Treatment Patient Details Name: Stephanie Benton MRN: [id number] DOB: [birth year] Today's Date: 05/22/2022   History of present illness Pt is a 71 y/o female admitted 05/17/22 with dehydration in setting of N&V and diarrhea for the past 2 weeks. PMH:  HTN, HLD, asthma, glaucoma, GERD   OT comments  Patient received in supine and agreeable to OT session. Patient was supervision to get to EOB and setup to donn gown to cover back. Patient performed toilet and tub transfer with min guard assist with tub transfer performed in walk in shower with simulated tub height. Patietn performed item retrieval and mobility in room with min guard assist. Patient continues to make good gains and appears to be limited by activity tolerance. Acute OT to continue to follow.    Recommendations for follow up therapy are one component of a multi-disciplinary discharge planning process, led by the attending physician.  Recommendations may be updated based on patient status, additional functional criteria and insurance authorization.    Follow Up Recommendations  No OT follow up    Assistance Recommended at Discharge PRN  Patient can return home with the following  Assistance with cooking/housework;Assist for transportation   Equipment Recommendations  BSC/3in1    Recommendations for Other Services      Precautions / Restrictions Precautions Precautions: Fall Restrictions Weight Bearing Restrictions: No       Mobility Bed Mobility Overal bed mobility: Needs Assistance Bed Mobility: Supine to Sit, Sit to Supine     Supine to sit: Supervision Sit to supine: Supervision   General bed mobility comments: returned to supine at end of session to prepare for transport    Transfers Overall transfer level: Needs assistance Equipment used: Rolling walker (2 wheels) Transfers: Sit to/from Stand Sit to Stand: Min guard           General transfer comment: min guard for safety wtih  tub and toilet transfers     Balance Overall balance assessment: Needs assistance Sitting-balance support: Feet supported Sitting balance-Leahy Scale: Good     Standing balance support: Single extremity supported, Bilateral upper extremity supported, During functional activity Standing balance-Leahy Scale: Fair Standing balance comment: min guard for safety                           ADL either performed or assessed with clinical judgement   ADL Overall ADL's : Needs assistance/impaired                 Upper Body Dressing : Set up Upper Body Dressing Details (indicate cue type and reason): to donn gown to cover back     Toilet Transfer: Min guard;Ambulation;Rolling walker (2 wheels) Toilet Transfer Details (indicate cue type and reason): min guard for safety     Tub/ Shower Transfer: Min guard;Rolling walker (2 wheels);Grab bars Tub/Shower Transfer Details (indicate cue type and reason): performed transfer into walk in shower with simulated tub height and min guard assist with education for safety   General ADL Comments: able to perform item retrieval with RW and min guard assist    Extremity/Trunk Assessment              Vision       Perception     Praxis      Cognition Arousal/Alertness: Awake/alert Behavior During Therapy: WFL for tasks assessed/performed Overall Cognitive Status: Within Functional Limits for tasks assessed  Exercises      Shoulder Instructions       General Comments      Pertinent Vitals/ Pain       Pain Assessment Pain Assessment: Faces Faces Pain Scale: Hurts a little bit Pain Location: abdomen Pain Descriptors / Indicators: Grimacing, Guarding Pain Intervention(s): Limited activity within patient's tolerance, Monitored during session  Home Living                                          Prior Functioning/Environment               Frequency  Min 2X/week        Progress Toward Goals  OT Goals(current goals can now be found in the care plan section)  Progress towards OT goals: Progressing toward goals  Acute Rehab OT Goals Patient Stated Goal: get better OT Goal Formulation: With patient Time For Goal Achievement: 05/31/22 Potential to Achieve Goals: Good ADL Goals Pt Will Transfer to Toilet: with modified independence;ambulating Pt Will Perform Tub/Shower Transfer: Tub transfer;with modified independence;ambulating;shower seat Additional ADL Goal #1: Pt to demo ability to gather ADL/IADL items using least restrictive AD with Modified Independence  Plan Discharge plan remains appropriate    Co-evaluation                 AM-PAC OT "6 Clicks" Daily Activity     Outcome Measure   Help from another person eating meals?: None Help from another person taking care of personal grooming?: A Little Help from another person toileting, which includes using toliet, bedpan, or urinal?: A Little Help from another person bathing (including washing, rinsing, drying)?: A Little Help from another person to put on and taking off regular upper body clothing?: A Little Help from another person to put on and taking off regular lower body clothing?: A Little 6 Click Score: 19    End of Session Equipment Utilized During Treatment: Rolling walker (2 wheels)  OT Visit Diagnosis: Other abnormalities of gait and mobility (R26.89)   Activity Tolerance Patient tolerated treatment well   Patient Left in bed;with call bell/phone within reach   Nurse Communication Mobility status        Time: 7616-0737 OT Time Calculation (min): 22 min  Charges: OT General Charges $OT Visit: 1 Visit OT Treatments $Self Care/Home Management : 8-22 mins  Stephanie Benton, OTA Acute Rehabilitation Services  Office [phone]   Dewain Penning 05/22/2022, 11:00 AM

## 2022-05-23 ENCOUNTER — Other Ambulatory Visit (HOSPITAL_COMMUNITY): Payer: Self-pay

## 2022-05-23 ENCOUNTER — Encounter (HOSPITAL_COMMUNITY): Payer: Self-pay | Admitting: Surgery

## 2022-05-23 LAB — CBC WITH DIFFERENTIAL/PLATELET
Abs Immature Granulocytes: 0.04 10*3/uL (ref 0.00–0.07)
Basophils Absolute: 0 10*3/uL (ref 0.0–0.1)
Basophils Relative: 0 %
Eosinophils Absolute: 0 10*3/uL (ref 0.0–0.5)
Eosinophils Relative: 0 %
HCT: 33.4 % — ABNORMAL LOW (ref 36.0–46.0)
Hemoglobin: 10.7 g/dL — ABNORMAL LOW (ref 12.0–15.0)
Immature Granulocytes: 1 %
Lymphocytes Relative: 10 %
Lymphs Abs: 0.7 10*3/uL (ref 0.7–4.0)
MCH: 26 pg (ref 26.0–34.0)
MCHC: 32 g/dL (ref 30.0–36.0)
MCV: 81.3 fL (ref 80.0–100.0)
Monocytes Absolute: 0.6 10*3/uL (ref 0.1–1.0)
Monocytes Relative: 9 %
Neutro Abs: 5.7 10*3/uL (ref 1.7–7.7)
Neutrophils Relative %: 80 %
Platelets: 258 10*3/uL (ref 150–400)
RBC: 4.11 MIL/uL (ref 3.87–5.11)
RDW: 14.8 % (ref 11.5–15.5)
WBC: 7 10*3/uL (ref 4.0–10.5)
nRBC: 0 % (ref 0.0–0.2)

## 2022-05-23 LAB — COMPREHENSIVE METABOLIC PANEL
ALT: 28 U/L (ref 0–44)
AST: 49 U/L — ABNORMAL HIGH (ref 15–41)
Albumin: 3 g/dL — ABNORMAL LOW (ref 3.5–5.0)
Alkaline Phosphatase: 77 U/L (ref 38–126)
Anion gap: 7 (ref 5–15)
BUN: 5 mg/dL — ABNORMAL LOW (ref 8–23)
CO2: 27 mmol/L (ref 22–32)
Calcium: 9.5 mg/dL (ref 8.9–10.3)
Chloride: 104 mmol/L (ref 98–111)
Creatinine, Ser: 1.1 mg/dL — ABNORMAL HIGH (ref 0.44–1.00)
GFR, Estimated: 53 mL/min — ABNORMAL LOW (ref >60)
Glucose, Bld: 103 mg/dL — ABNORMAL HIGH (ref 70–99)
Potassium: 4.7 mmol/L (ref 3.5–5.1)
Sodium: 138 mmol/L (ref 135–145)
Total Bilirubin: 0.7 mg/dL (ref 0.3–1.2)
Total Protein: 5.7 g/dL — ABNORMAL LOW (ref 6.5–8.1)

## 2022-05-23 LAB — MAGNESIUM: Magnesium: 1.7 mg/dL (ref 1.7–2.4)

## 2022-05-23 LAB — LIPASE, BLOOD: Lipase: 67 U/L — ABNORMAL HIGH (ref 11–51)

## 2022-05-23 LAB — SURGICAL PATHOLOGY

## 2022-05-23 LAB — PHOSPHORUS: Phosphorus: 3.2 mg/dL (ref 2.5–4.6)

## 2022-05-23 MED ORDER — TRAMADOL HCL 50 MG PO TABS: 50.0000 mg | ORAL_TABLET | Freq: Four times a day (QID) | ORAL | 0 refills | Status: DC | PRN

## 2022-05-23 MED ORDER — SUCRALFATE 1 GM/10ML PO SUSP
1.0000 g | Freq: Four times a day (QID) | ORAL | 0 refills | Status: DC
  Filled 2022-05-23: qty 1200, 30d supply, fill #0

## 2022-05-23 MED ORDER — POLYETHYLENE GLYCOL 3350 17 GM/SCOOP PO POWD
17.0000 g | Freq: Every day | ORAL | 0 refills | Status: AC
  Filled 2022-05-23: qty 238, 14d supply, fill #0

## 2022-05-23 MED ORDER — PANTOPRAZOLE SODIUM 40 MG PO TBEC
40.0000 mg | DELAYED_RELEASE_TABLET | Freq: Two times a day (BID) | ORAL | 1 refills | Status: DC
  Filled 2022-05-23: qty 60, 30d supply, fill #0

## 2022-05-23 NOTE — Progress Notes (Signed)
Stephanie Benton [facility] Progress Note  1 Day Post-Op  Subjective: CC-  Abdomen sore but states that her abdominal pain is different and improved from prior to surgery. She is tolerating a diet. Mild nausea last night, no emesis. Ambulating without issues. Pain well controlled with tramadol.  Objective: Vital signs in last 24 hours: Temp:  [97.4 F (36.3 C)-97.9 F (36.6 C)] 97.6 F (36.4 C) (10/19 0319) Pulse Rate:  [64-100] 86 (10/19 0319) Resp:  [14-20] 15 (10/19 0319) BP: (74-146)/(60-92) 146/92 (10/19 0319) SpO2:  [88 %-97 %] 97 % (10/19 0319) Weight:  [84.8 kg] 84.8 kg (10/18 0954) Last BM Date :  (patient states she has not had BM for 2 weeks.)  Intake/Output from previous day: 10/18 0701 - 10/19 0700 In: 1200 [I.V.:1000; IV Piggyback:200] Out: 20 [Blood:20] Intake/Output this shift: No intake/output data recorded.  PE: Gen:  Alert, NAD, pleasant Abd: soft, ND, mild RUQ tenderness without rebound or guarding, lap incisions cdi  Lab Results:  Recent Labs    05/22/22 0205 05/23/22 0200  WBC 4.0 7.0  HGB 9.9* 10.7*  HCT 30.3* 33.4*  PLT 236 258   BMET Recent Labs    05/22/22 0205 05/23/22 0200  NA 138 138  K 3.5 4.7  CL 104 104  CO2 27 27  GLUCOSE 88 103*  BUN 6* 5*  CREATININE 1.05* 1.10*  CALCIUM 9.6 9.5   PT/INR No results for input(s): "LABPROT", "INR" in the last 72 hours. CMP     Component Value Date/Time   NA 138 05/23/2022 0200   K 4.7 05/23/2022 0200   CL 104 05/23/2022 0200   CO2 27 05/23/2022 0200   GLUCOSE 103 (H) 05/23/2022 0200   BUN 5 (L) 05/23/2022 0200   CREATININE 1.10 (H) 05/23/2022 0200   CALCIUM 9.5 05/23/2022 0200   PROT 5.7 (L) 05/23/2022 0200   ALBUMIN 3.0 (L) 05/23/2022 0200   AST 49 (H) 05/23/2022 0200   ALT 28 05/23/2022 0200   ALKPHOS 77 05/23/2022 0200   BILITOT 0.7 05/23/2022 0200   GFRNONAA 53 (L) 05/23/2022 0200   GFRAA >60 10/11/2019 1636   Lipase     Component Value Date/Time   LIPASE 67 (H)  05/23/2022 0200       Studies/Results: No results found.  Anti-infectives: Anti-infectives (From admission, onward)    Start     Dose/Rate Route Frequency Ordered Stop   05/22/22 1145  ciprofloxacin (CIPRO) IVPB 400 mg        400 mg 200 mL/hr over 60 Minutes Intravenous To Surgery 05/22/22 1142 05/22/22 1221        Assessment/Plan Symptomatic gallstones POD#1 s/p laparoscopic cholecystectomy 10/18 Dr. Ninfa Linden  - Doing well from surgery and would be ok for discharge. I sent rx for tramadol to her pharmacy. Discharge instructions and follow up info on AVS. Therapies recommending home health PT.    LOS: 6 days    Stephanie Benton, [facility] 05/23/2022, 8:20 AM Please see Amion for pager number during day hours 7:00am-4:30pm

## 2022-05-23 NOTE — Progress Notes (Signed)
Physical Therapy Treatment Patient Details Name: Stephanie Benton MRN: [id number] DOB: 03-Feb-1951 Today's Date: 05/23/2022   History of Present Illness Pt is a 71 y/o female admitted 05/17/22 with dehydration in setting of N&V and diarrhea for the past 2 weeks, possible acute cholecystitis. Lap chole on 10/18. PMH:  HTN, HLD, asthma, glaucoma, GERD    PT Comments    Pt progressing with mobility. Today's session focused on ambulation endurance with LRAD (pt uses cane at baseline but requested continued use of RW and states continued use when d/c too). Pt ambulated 75 ft min guard slow steady with RW. Pt HR during functional mobility was sustained 142-146 bpm, returned to 113 bpm with prolonged seated rest break after completion of functional mobility. Pt remains limited by generalized weakness, decreased activity tolerance, and impaired balance strategies/postural reactions. Continue to recommend acute PT services to maximize functional mobility and independence prior to return home with HHPT.   Mobility vitals: Pre BP 120/72 Pre HR 101 bpm, during 142-146 bpm, post 113 bpm SpO2 >96% on RA at all times    Recommendations for follow up therapy are one component of a multi-disciplinary discharge planning process, led by the attending physician.  Recommendations may be updated based on patient status, additional functional criteria and insurance authorization.  Follow Up Recommendations  Home health PT     Assistance Recommended at Discharge Intermittent Supervision/Assistance  Patient can return home with the following A little help with walking and/or transfers;A little help with bathing/dressing/bathroom;Assist for transportation   Equipment Recommendations  None recommended by PT    Recommendations for Other Services       Precautions / Restrictions Precautions Precautions: Fall Restrictions Weight Bearing Restrictions: No     Mobility  Bed Mobility Overal bed mobility: Needs  Assistance Bed Mobility: Supine to Sit     Supine to sit: Modified independent (Device/Increase time), HOB elevated     General bed mobility comments: Pt perfromed bed mobiltiy to sit EOB with increased time and use of 1 handail on L side moving to L    Transfers Overall transfer level: Needs assistance Equipment used: Rolling walker (2 wheels) Transfers: Sit to/from Stand Sit to Stand: Min guard           General transfer comment: Min guard for line mgmt and RW/hand placement    Ambulation/Gait Ambulation/Gait assistance: Min guard Gait Distance (Feet): 75 Feet Assistive device: Rolling walker (2 wheels) Gait Pattern/deviations: Step-through pattern, Decreased stride length Gait velocity: slow guarded gait     General Gait Details: Multiple small steps during turn in hallway, one standing rest break to monitor HR. Further ambulation deferred due to sustained tachycardia in 140s.   Stairs             Wheelchair Mobility    Modified Rankin (Stroke Patients Only)       Balance Overall balance assessment: Needs assistance Sitting-balance support: Feet supported Sitting balance-Leahy Scale: Good Sitting balance - Comments: Sitting EOB without UE support supervision while weight shifting to prepare to stand   Standing balance support: Bilateral upper extremity supported, During functional activity Standing balance-Leahy Scale: Fair Standing balance comment: Pt preference to rely on RW, able to intermittently take 1 hand off during dynamic standing to readjust gown placement                            Cognition Arousal/Alertness: Awake/alert Behavior During Therapy: [facility] for tasks assessed/performed Overall Cognitive  Status: Within Functional Limits for tasks assessed                                 General Comments: Pt AOx4 and able to divide attention between tasks and conversation with therapy        Exercises      General  Comments General comments (skin integrity, edema, etc.): Watch HR closely -- 101 bpm at rest, sustained 142-146 bpm during ambulation and returned to 113 bpm after sitting 4 minutes after mobility      Pertinent Vitals/Pain Pain Assessment Pain Assessment: Faces Faces Pain Scale: Hurts even more Pain Location: abdomen Pain Descriptors / Indicators: Grimacing, Guarding, Sharp Pain Intervention(s): Monitored during session    Home Living                          Prior Function            PT Goals (current goals can now be found in the care plan section) Acute Rehab PT Goals Patient Stated Goal: get well PT Goal Formulation: With patient Time For Goal Achievement: 05/24/22 Potential to Achieve Goals: Good Progress towards PT goals: Progressing toward goals    Frequency    Min 3X/week      PT Plan Current plan remains appropriate    Co-evaluation              AM-PAC PT "6 Clicks" Mobility   Outcome Measure  Help needed turning from your back to your side while in a flat bed without using bedrails?: None Help needed moving from lying on your back to sitting on the side of a flat bed without using bedrails?: None Help needed moving to and from a bed to a chair (including a wheelchair)?: A Little Help needed standing up from a chair using your arms (e.g., wheelchair or bedside chair)?: A Little Help needed to walk in hospital room?: A Little Help needed climbing 3-5 steps with a railing? : A Lot 6 Click Score: 19    End of Session Equipment Utilized During Treatment: Gait belt Activity Tolerance: Patient tolerated treatment well Patient left: with call bell/phone within reach;with nursing/sitter in room;in chair Nurse Communication: Mobility status PT Visit Diagnosis: Unsteadiness on feet (R26.81);Other abnormalities of gait and mobility (R26.89);Repeated falls (R29.6);History of falling (Z91.81);Muscle weakness (generalized) (M62.81);Difficulty in  walking, not elsewhere classified (R26.2);Pain     Time: 1610-9604 PT Time Calculation (min) (ACUTE ONLY): 20 min  Charges:  $Therapeutic Activity: 8-22 mins                     Stephanie Benton, SPT    [place] 05/23/2022, 1:41 PM

## 2022-05-23 NOTE — Discharge Summary (Signed)
Physician Discharge Summary  BRITTANE GRUDZINSKI ZOX:[id number] DOB: Aug 13, 1950 DOA: 05/16/2022  PCP: Sharmon Revere, MD  Admit date: 05/16/2022 Discharge date: 05/23/2022  Time spent: 40 minutes  Recommendations for Outpatient Follow-up:  Follow outpatient CBC/CMP  Follow with surgery outpatient as scheduled Follow with GI outpatient Chronic post operative seroma, follow with surgery outpatient   Follow blood pressure outpatient   Discharge Diagnoses:  Principal Problem:   Dehydration Active Problems:   Chest pain   Hypertension   Bipolar disorder, in full remission, most recent episode mixed (HCC)   Pancreatitis   Metabolic acidosis   Esophagitis determined by endoscopy   Abdominal pain, chronic, epigastric   LFTs abnormal   Discharge Condition: stable  Diet recommendation: heart healthy  Filed Weights   05/17/22 1656 05/18/22 1817 05/22/22 0954  Weight: 83.7 kg 85 kg 84.8 kg    History of present illness:  Stephanie Benton is Stephanie Benton 71 y.o. female with medical history significant of HTN, HLD, asthma, glaucoma, GERD, presented with intermittent, worsening nausea, vomiting, and epigastric pain on going for the past 2 weeks. Pt was seen about Rula [place] week ago of which CTA of her chest and CT of abdomen was unremarkable, noted elevated lipase. Pt was discharged, but never got better. Pt went to her primary care and was told to go to ED due to abnormal labs. Patient does use BC powders daily, denies any other NSAID use.  In the ED, lipase mildly elevated, ultrasound showed positive Murphy sign, cholelithiasis, but no gallbladder wall thickening or pericholecystic edema.  General surgery and GI consulted.  Patient admitted for further management.  She underwent EGD which showed esophagitis and gastritis.  Surgery ultimately performed lap chole for concern for symptomatic cholelithiasis.   See below for additional details    Hospital Course:  Assessment and Plan: Intractable  nausea/vomiting with epigastric pain Bleeding esophagitis with some gastritis Symptomatic Cholelithiasis Currently afebrile, with no leukocytosis Mildly elevated lipase CT Jamarien Rodkey/P unremarkable, RUQ USS showed cholelithiasis, but no gallbladder wall thickening or pericholecystic edema MRCP negative for choledocholithiasis DG abdomen, showed some gas in the colon, but no substantial stool volume to suggest clinical constipation GI on board, s/p EGD which showed LA grade B esophagitis with bleeding, gastritis, erythematous shows duodenopathy.  Biopsy with gastric antral mucosa with nonspecific reactive gastropathy.  Esophageal squamous and cardiac mucosa with focal erosion and reactive changes.  GI signed off, recommending PPI BID, sucralfate TID.  S/p lap chole 10/18 for suspected symptomatic cholelithiasis Pain management, PPI Monitor closely   Mildly elevated troponin Likely demand Troponin with flat trend EKG with no acute ST changes Echo showed EF of 60 to 65%, grade 1 diastolic dysfunction, no regional wall motion abnormality   Hypokalemia/hypomagnesemia improved   AKI resolved   Hypertension Resume micardis, torsemide at discharge (BP's ok here off meds, recommended she watch for symptoms - LH/dizziness, etc)   Hyperlipidemia Resume statin at discharge  Asthma Stable Continue inhalers   Bipolar Continue venlafaxine, risperidone, zoloft   Obesity Body mass index is 31.12 kg/m.      Procedures: Lap chole 10/18 EGD - LA Grade B esophagitis with bleeding. Biopsied. - 4 cm hiatal hernia. - Gastritis. Biopsied. - Erythematous duodenopathy. Impression: - Return patient to hospital ward for ongoing care. - Resume regular diet. - Continue present medications. - Await pathology results. - PPI BID. - Sucralfate 1 gram TID.  Ecbo IMPRESSIONS     1. Left ventricular ejection fraction, by estimation, is 60 to 65%.  The  left ventricle has normal function. The left  ventricle has no regional  wall motion abnormalities. There is mild left ventricular hypertrophy.  Left ventricular diastolic parameters  are consistent with Grade I diastolic dysfunction (impaired relaxation).   2. Right ventricular systolic function is normal. The right ventricular  size is normal.   3. The mitral valve is normal in structure. No evidence of mitral valve  regurgitation.   4. There is mild calcification of the aortic valve. Aortic valve  regurgitation is not visualized.   5. The inferior vena cava is normal in size with greater than 50%  respiratory variability, suggesting right atrial pressure of 3 mmHg.  Consultations: Surgery GI  Discharge Exam: Vitals:   05/23/22 0028 05/23/22 0319  BP: 117/69 (!) 146/92  Pulse: 64 86  Resp: 15 15  Temp: (!) 97.4 F (36.3 C) 97.6 F (36.4 C)  SpO2: 97% 97%   Ok with discharge home  General: No acute distress. Cardiovascular: RRR Lungs: unlabored Abdomen: appropriately tender post op, laparoscopic sites intact Neurological: Alert and oriented 3. Moves all extremities 4 with equal strength. Cranial nerves II through XII grossly intact. Extremities: No clubbing or cyanosis. No edema  Discharge Instructions   Discharge Instructions     Call MD for:  difficulty breathing, headache or visual disturbances   Complete by: As directed    Call MD for:  extreme fatigue   Complete by: As directed    Call MD for:  hives   Complete by: As directed    Call MD for:  persistant dizziness or light-headedness   Complete by: As directed    Call MD for:  persistant nausea and vomiting   Complete by: As directed    Call MD for:  redness, tenderness, or signs of infection (pain, swelling, redness, odor or green/yellow discharge around incision site)   Complete by: As directed    Call MD for:  severe uncontrolled pain   Complete by: As directed    Call MD for:  temperature >100.4   Complete by: As directed    Diet - low sodium  heart healthy   Complete by: As directed    Discharge instructions   Complete by: As directed    You were seen for abdominal pain.  We think this was due to symptomatic gallstones.  You've now been treated with cholecystectomy (gallbladder removal).  You had an EGD that showed esophagitis (inflammation of the esophagus), gastritis (inflammation of the stomach).  Biopsies showed reactive gastropathy.  Biopsies also showed esophageal squamous and cardiac mucosa with focal erosion and reactive changes.  We'll send you out on sucralfate and protonix twice daily.  Avoid aspirin and NSAIDs.  Please follow up with gastroenterology as an outpatient.  Resume your blood pressure medicine at home.  Watch for lightheadedness or dizziness.   Return for new, recurrent, or worsening symptoms.  Please ask your PCP to request records from this hospitalization so they know what was done and what the next steps will be.   Discharge wound care:   Complete by: As directed    Per general surgery   Increase activity slowly   Complete by: As directed       Allergies as of 05/23/2022       Reactions   Fish-derived Products Anaphylaxis   Morphine And Related Anaphylaxis, Swelling   Belsomra [suvorexant] Other (See Comments)   Caused nightmares   Codeine Nausea And Vomiting   Fentanyl Hives   Ibuprofen  Nausea Only, Other (See Comments)   Severe nausea- history of stomach ulcers   Nsaids Other (See Comments)   History of stomach ulcers   Other Other (See Comments)   History of stomach ulcers   Penicillins Hives   Did it involve swelling of the face/tongue/throat, SOB, or low BP? Yes Did it involve sudden or severe rash/hives, skin peeling, or any reaction on the inside of your mouth or nose? Yes Did you need to seek medical attention at [facility] or doctor's office? Yes When did it last happen? More than 10 years ago If all above answers are "NO", may proceed with cephalosporin use.   Sulfa  Antibiotics Swelling   Site of swelling not recalled   Tolmetin Other (See Comments)   History of stomach ulcers        Medication List     STOP taking these medications    Dexilant 60 MG capsule Generic drug: dexlansoprazole   potassium chloride 20 MEQ packet Commonly known as: KLOR-CON       TAKE these medications    Ventolin HFA 108 (90 Base) MCG/ACT inhaler Generic drug: albuterol Inhale 2 puffs into the lungs every 6 (six) hours as needed for wheezing or shortness of breath.   albuterol (2.5 MG/3ML) 0.083% nebulizer solution Commonly known as: PROVENTIL Take 3 mLs (2.5 mg total) by nebulization every 6 (six) hours as needed for wheezing or shortness of breath.   cyanocobalamin 1000 MCG/ML injection Commonly known as: VITAMIN B12 Inject 1,000 mcg into the muscle every 30 (thirty) days.   diclofenac sodium 1 % Gel Commonly known as: VOLTAREN Apply 2-4 g topically 4 (four) times daily as needed (for pain).   dorzolamide 2 % ophthalmic solution Commonly known as: TRUSOPT Place 1 drop into both eyes 2 (two) times daily.   FeroSul 325 (65 FE) MG tablet Generic drug: ferrous sulfate Take 325 mg by mouth 3 (three) times daily.   latanoprost 0.005 % ophthalmic solution Commonly known as: XALATAN Place 1 drop into both eyes at bedtime.   Lyrica 100 MG capsule Generic drug: pregabalin Take 100 mg by mouth 2 (two) times daily.   nicotine 21 mg/24hr patch Commonly known as: NICODERM CQ - dosed in mg/24 hours Place 1 patch (21 mg total) onto the skin daily.   pantoprazole 40 MG tablet Commonly known as: PROTONIX Take 1 tablet (40 mg total) by mouth 2 (two) times daily before Yarissa Reining meal.   polyethylene glycol 17 g packet Commonly known as: MIRALAX / GLYCOLAX Take 17 g by mouth daily. Start taking on: May 24, 2022   risperiDONE 1 MG tablet Commonly known as: RISPERDAL Take 1 mg by mouth at bedtime.   sertraline 100 MG tablet Commonly known as:  ZOLOFT Take 100 mg by mouth at bedtime.   simvastatin 40 MG tablet Commonly known as: ZOCOR Take 40 mg by mouth at bedtime.   sucralfate 1 GM/10ML suspension Commonly known as: Carafate Take 10 mLs (1 g total) by mouth 4 (four) times daily.   Symbicort 160-4.5 MCG/ACT inhaler Generic drug: budesonide-formoterol Inhale 1 puff into the lungs 2 (two) times daily as needed (for flares).   telmisartan 80 MG tablet Commonly known as: MICARDIS Take 80 mg by mouth at bedtime.   tolterodine 2 MG 24 hr capsule Commonly known as: DETROL LA Take 2 mg by mouth daily.   torsemide 10 MG tablet Commonly known as: DEMADEX Take 10 mg by mouth daily as needed (for swelling in legs).  traMADol 50 MG tablet Commonly known as: ULTRAM Take 1 tablet (50 mg total) by mouth every 6 (six) hours as needed for severe pain. What changed:  when to take this reasons to take this   venlafaxine XR 37.5 MG 24 hr capsule Commonly known as: EFFEXOR-XR Take 37.5 mg by mouth at bedtime.               Discharge Care Instructions  (From admission, onward)           Start     Ordered   05/23/22 0000  Discharge wound care:       Comments: Per general surgery   05/23/22 1039           Allergies  Allergen Reactions   Fish-Derived Products Anaphylaxis   Morphine And Related Anaphylaxis and Swelling   Belsomra [Suvorexant] Other (See Comments)    Caused nightmares   Codeine Nausea And Vomiting   Fentanyl Hives   Ibuprofen Nausea Only and Other (See Comments)    Severe nausea- history of stomach ulcers   Nsaids Other (See Comments)    History of stomach ulcers   Other Other (See Comments)    History of stomach ulcers   Penicillins Hives    Did it involve swelling of the face/tongue/throat, SOB, or low BP? Yes Did it involve sudden or severe rash/hives, skin peeling, or any reaction on the inside of your mouth or nose? Yes Did you need to seek medical attention at [facility] or  doctor's office? Yes When did it last happen? More than 10 years ago If all above answers are "NO", may proceed with cephalosporin use.    Sulfa Antibiotics Swelling    Site of swelling not recalled   Tolmetin Other (See Comments)    History of stomach ulcers    Follow-up Information     [facility] Go on 06/06/2022.   Specialty: General Surgery Why: Your appointment is 06/06/22 at 2:45 pm Arrive early to check in, fill out paperwork, Bring photo ID and insurance information Contact information: [address] [place] [address] [phone]        Sharmon Revere, MD Follow up.   Specialty: Family Medicine Contact information: [address] [place] [address] [phone]         Jeani Hawking, MD Follow up.   Specialty: Gastroenterology Contact information: [address] [place] [address] [phone]                  The results of significant diagnostics from this hospitalization (including imaging, microbiology, ancillary and laboratory) are listed below for reference.    Significant Diagnostic Studies: DG Abd Portable 1V  Result Date: 05/20/2022 CLINICAL DATA:  Constipation. EXAM: PORTABLE ABDOMEN - 1 VIEW COMPARISON:  Abdomen pelvis CT 05/09/2022 FINDINGS: Scattered small bowel gas evident in Stewart Sasaki nonobstructive pattern. Air is seen scattered along the course of Jayliani Wanner mildly distended colon. No substantial stool volume. Rectum appears largely free of stool. Sequelae of ventral mesh placement noted. IMPRESSION: Nonobstructive bowel gas pattern. There is some gas in the colon, but no substantial stool volume to suggest clinical constipation. Electronically Signed   By: Kennith Center M.D.   On: 05/20/2022 08:12   MR ABDOMEN MRCP W WO CONTAST  Result Date: 05/19/2022 CLINICAL DATA:  Epigastric pain.  Pancreatitis suspected. EXAM: MRI ABDOMEN WITHOUT AND WITH CONTRAST  (INCLUDING MRCP) TECHNIQUE: Multiplanar multisequence MR imaging of the abdomen was performed  both before and after the administration of intravenous contrast. Heavily T2-weighted images of the biliary and pancreatic ducts were obtained, and three-dimensional MRCP images were rendered by post processing. CONTRAST:  6mL GADAVIST GADOBUTROL 1 MMOL/ML IV SOLN COMPARISON:  CT abdomen/pelvis 05/09/2022 FINDINGS: Lower chest: Unremarkable. Hepatobiliary: No suspicious focal abnormality within the liver parenchyma. Gallbladder is distended with numerous tiny gallstones evident. No intrahepatic or extrahepatic biliary dilation. MRCP imaging shows no evidence for choledocholithiasis. Pancreas: No substantial peripancreatic edema. No focal pancreatic mass lesion or intraparenchymal cyst. No main duct dilatation. Pancreatic parenchyma enhances throughout. Spleen:  No splenomegaly. No focal mass lesion. Adrenals/Urinary Tract: No adrenal nodule or mass. Kidneys unremarkable. Stomach/Bowel: Moderate to large hiatal hernia. Duodenum is normally positioned as is the ligament of Treitz. No small bowel or colonic dilatation within the visualized abdomen. Vascular/Lymphatic: No abdominal aortic aneurysm There is no gastrohepatic or hepatoduodenal ligament lymphadenopathy. No retroperitoneal or mesenteric lymphadenopathy. Other: No intraperitoneal free fluid. 6.3 x 7.2 x 4.7 cm chronic complex fluid collection just deep to the left rectus sheath is not substantially changed since prior CT of 05/09/2022. Musculoskeletal: No focal suspicious marrow enhancement within the visualized bony anatomy. IMPRESSION: 1. No substantial peripancreatic edema. No focal pancreatic mass lesion or intraparenchymal cyst. No main duct dilatation. 2. Cholelithiasis. No intrahepatic or extrahepatic biliary dilation. No evidence for choledocholithiasis. 3. Moderate to large hiatal hernia. 4. Chronic fluid collection deep to the left rectus sheath is not  substantially changed since prior CT of 05/09/2022. Electronically Signed   By: Kennith Center M.D.   On: 05/19/2022 11:52   ECHOCARDIOGRAM COMPLETE  Result Date: 05/17/2022    ECHOCARDIOGRAM REPORT   Patient Name:   SHANDELLE BORRELLI Dolloff Date of Exam: 05/17/2022 Medical Rec #:  [id number]      Height:       65.0 in Accession #:    [id number]     Weight:       227.0 lb Date of Birth:  [birth year]      BSA:          2.088 m Patient Age:    71 years       BP:           124/70 mmHg Patient Gender: F              HR:           90 bpm. Exam Location:  Inpatient Procedure: 2D Echo, Cardiac Doppler and Color Doppler Indications:    Chest Pain R07.9  History:        Patient has no prior history of Echocardiogram examinations.                 COPD, Signs/Symptoms:Chest Pain; Risk Factors:Hypertension and                 Current Smoker.  Sonographer:    Lucendia Herrlich Referring Phys: [id number] [name] DOUTOVA IMPRESSIONS  1. Left ventricular ejection fraction, by estimation, is 60 to 65%. The left ventricle has normal function. The left ventricle has no regional wall motion abnormalities. There is mild left ventricular hypertrophy. Left ventricular diastolic parameters are consistent with Grade I diastolic dysfunction (impaired relaxation).  2. Right ventricular systolic function is normal. The right ventricular size is normal.  3. The mitral valve is normal in structure. No evidence of mitral valve regurgitation.  4. There is mild calcification of the aortic valve. Aortic valve regurgitation is not visualized.  5. The inferior vena cava is normal  in size with greater than 50% respiratory variability, suggesting right atrial pressure of 3 mmHg. FINDINGS  Left Ventricle: Left ventricular ejection fraction, by estimation, is 60 to 65%. The left ventricle has normal function. The left ventricle has no regional wall motion abnormalities. The left ventricular internal cavity size was normal in size. There is  mild left ventricular  hypertrophy. Left ventricular diastolic parameters are consistent with Grade I diastolic dysfunction (impaired relaxation). Right Ventricle: The right ventricular size is normal. No increase in right ventricular wall thickness. Right ventricular systolic function is normal. Left Atrium: Left atrial size was normal in size. Right Atrium: Right atrial size was normal in size. Pericardium: There is no evidence of pericardial effusion. Mitral Valve: The mitral valve is normal in structure. No evidence of mitral valve regurgitation. Tricuspid Valve: The tricuspid valve is normal in structure. Tricuspid valve regurgitation is not demonstrated. Aortic Valve: There is mild calcification of the aortic valve. Aortic valve regurgitation is not visualized. Aortic valve mean gradient measures 7.0 mmHg. Aortic valve peak gradient measures 13.0 mmHg. Aortic valve area, by VTI measures 2.79 cm. Pulmonic Valve: The pulmonic valve was normal in structure. Pulmonic valve regurgitation is not visualized. Aorta: The aortic root and ascending aorta are structurally normal, with no evidence of dilitation. Venous: The inferior vena cava is normal in size with greater than 50% respiratory variability, suggesting right atrial pressure of 3 mmHg. IAS/Shunts: No atrial level shunt detected by color flow Doppler.  LEFT VENTRICLE PLAX 2D LVIDd:         4.00 cm   Diastology LVIDs:         2.30 cm   LV e' medial:    7.07 cm/s LV PW:         1.10 cm   LV E/e' medial:  13.6 LV IVS:        1.00 cm   LV e' lateral:   9.25 cm/s LVOT diam:     2.05 cm   LV E/e' lateral: 10.4 LV SV:         79 LV SV Index:   38 LVOT Area:     3.30 cm  RIGHT VENTRICLE             IVC RV S prime:     14.90 cm/s  IVC diam: 1.70 cm TAPSE (M-mode): 2.4 cm LEFT ATRIUM             Index        RIGHT ATRIUM           Index LA diam:        3.80 cm 1.82 cm/m   RA Area:     13.70 cm LA Vol (A2C):   35.4 ml 16.96 ml/m  RA Volume:   31.70 ml  15.19 ml/m LA Vol (A4C):   58.5 ml  28.02 ml/m LA Biplane Vol: 45.6 ml 21.84 ml/m  AORTIC VALVE AV Area (Vmax):    2.79 cm AV Area (Vmean):   2.70 cm AV Area (VTI):     2.79 cm AV Vmax:           180.00 cm/s AV Vmean:          120.000 cm/s AV VTI:            0.284 m AV Peak Grad:      13.0 mmHg AV Mean Grad:      7.0 mmHg LVOT Vmax:         152.00 cm/s LVOT  Vmean:        98.100 cm/s LVOT VTI:          0.240 m LVOT/AV VTI ratio: 0.85  AORTA Ao Root diam: 2.70 cm Ao Asc diam:  3.30 cm MITRAL VALVE MV Area (PHT): 4.17 cm     SHUNTS MV Decel Time: 182 msec     Systemic VTI:  0.24 m MV E velocity: 96.10 cm/s   Systemic Diam: 2.05 cm MV Bradley Bostelman velocity: 127.00 cm/s MV E/Jhade Berko ratio:  0.76 Mary Scientist, physiological signed by Phineas Inches Signature Date/Time: 05/17/2022/11:31:04 AM    Final    US Abdomen Limited RUQ (LIVER/GB)  Result Date: 05/16/2022 CLINICAL DATA:  Right upper quadrant pain, nausea/vomiting. EXAM: ULTRASOUND ABDOMEN LIMITED RIGHT UPPER QUADRANT COMPARISON:  05/09/2022. FINDINGS: Gallbladder: Stones and sludge are present within the gallbladder. No gallbladder wall thickening or pericholecystic fluid. Shekelia Boutin Murphy sign is noted by sonographer. Common bile duct: Diameter: 3.6 mm. Liver: No focal lesion identified. Increased parenchymal echogenicity. Portal vein is patent on color Doppler imaging with normal direction of blood flow towards the liver. Other: No free fluid. IMPRESSION: 1. Cholelithiasis and sludge in the gallbladder with positive Murphy sign. No gallbladder wall thickening or pericholecystic edema. Clinical correlation is recommended to exclude acute cholecystitis. 2. Hepatic steatosis. Electronically Signed   By: Brett Fairy M.D.   On: 05/16/2022 22:12   DG Chest 1 View  Result Date: 05/16/2022 CLINICAL DATA:  Chest pain EXAM: CHEST  1 VIEW COMPARISON:  Chest 05/09/2022 FINDINGS: Heart size and vascularity normal. Lungs clear without infiltrate effusion. Moderate to large hiatal hernia. IMPRESSION: No active disease.  Electronically Signed   By: Franchot Gallo M.D.   On: 05/16/2022 16:21   CT Angio Chest Pulmonary Embolism (PE) W or WO Contrast  Result Date: 05/09/2022 CLINICAL DATA:  Left chest pressure for 2 weeks. Pulmonary embolism (PE) suspected, high prob; Abdominal pain, acute, nonlocalized EXAM: CT ANGIOGRAPHY CHEST CT ABDOMEN AND PELVIS WITH CONTRAST TECHNIQUE: Multidetector CT imaging of the chest was performed using the standard protocol during bolus administration of intravenous contrast. Multiplanar CT image reconstructions and MIPs were obtained to evaluate the vascular anatomy. Multidetector CT imaging of the abdomen and pelvis was performed using the standard protocol during bolus administration of intravenous contrast. RADIATION DOSE REDUCTION: This exam was performed according to the departmental dose-optimization program which includes automated exposure control, adjustment of the mA and/or kV according to patient size and/or use of iterative reconstruction technique. CONTRAST:  74mL OMNIPAQUE IOHEXOL 350 MG/ML SOLN COMPARISON:  Chest radiograph from earlier today. FINDINGS: CTA CHEST FINDINGS Cardiovascular: The study is moderate quality for the evaluation of pulmonary embolism, with some motion degradation. There are no filling defects in the central, lobar, segmental or subsegmental pulmonary artery branches to suggest acute pulmonary embolism. Atherosclerotic nonaneurysmal thoracic aorta. Normal caliber pulmonary arteries. Normal heart size. No significant pericardial fluid/thickening. Three-vessel coronary atherosclerosis. Mediastinum/Nodes: No significant thyroid nodules. Unremarkable esophagus. No pathologically enlarged axillary, mediastinal or hilar lymph nodes. Lungs/Pleura: No pneumothorax. No pleural effusion. No acute consolidative airspace disease, lung masses or significant pulmonary nodules. Mild compressive atelectasis in the medial lower lobes from the large hiatal hernia. Musculoskeletal:  No aggressive appearing focal osseous lesions. Healed deformities in multiple anterior left ribs. Incompletely healed posterolateral left eleventh rib fracture, probably chronic and ununited. Mild thoracic spondylosis. Review of the MIP images confirms the above findings. CT ABDOMEN and PELVIS FINDINGS Hepatobiliary: Normal liver size. Two scattered subcentimeter hypodense liver lesions, too small to  characterize. Cholelithiasis. No gallbladder wall thickening or pericholecystic fluid. No biliary ductal dilatation. Pancreas: Normal, with no mass or duct dilation. Spleen: Normal size. No mass. Adrenals/Urinary Tract: Normal adrenals. Normal kidneys with no hydronephrosis and no renal mass. Normal bladder. Stomach/Bowel: Large hiatal hernia. Stomach is nondistended and otherwise normal. Normal caliber small bowel with no small bowel wall thickening. Appendectomy. Normal large bowel with no diverticulosis, large bowel wall thickening or pericolonic fat stranding. Vascular/Lymphatic: Atherosclerotic nonaneurysmal abdominal aorta. Patent portal, splenic, hepatic and renal veins. No pathologically enlarged lymph nodes in the abdomen or pelvis. Reproductive: Status post hysterectomy, with no abnormal findings at the vaginal cuff. No adnexal mass. Other: No pneumoperitoneum. No ascites. In the midline ventral peritoneal cavity just deep to the rectus muscle sheath and associated with hernia repair mesh, there is Ivar Domangue mildly heterogeneous 6.5 x 4.9 x 6.0 cm fluid collection (series 5/image 30), previously 7.1 x 2.6 x 5.6 cm on 05/27/2018 MRI, overall mildly increased. Musculoskeletal: No aggressive appearing focal osseous lesions. Marked lower lumbar facet arthropathy and degenerative disc disease. Review of the MIP images confirms the above findings. IMPRESSION: 1. No evidence of pulmonary embolism. No acute pulmonary disease. 2. Large hiatal hernia. 3. Cholelithiasis. No evidence of acute cholecystitis. 4. No evidence of  bowel obstruction or acute bowel inflammation. 5. Chronic 6.5 x 4.9 x 6.0 cm midline ventral peritoneal cavity fluid collection just deep to the rectus muscle sheath and associated with the hernia repair mesh, overall mildly increased since 2019 MRI, compatible with chronic postoperative seroma. 6. Three-vessel coronary atherosclerosis. 7. Aortic Atherosclerosis (ICD10-I70.0). Electronically Signed   By: Delbert Phenix M.D.   On: 05/09/2022 17:20   CT ABDOMEN PELVIS W CONTRAST  Result Date: 05/09/2022 CLINICAL DATA:  Left chest pressure for 2 weeks. Pulmonary embolism (PE) suspected, high prob; Abdominal pain, acute, nonlocalized EXAM: CT ANGIOGRAPHY CHEST CT ABDOMEN AND PELVIS WITH CONTRAST TECHNIQUE: Multidetector CT imaging of the chest was performed using the standard protocol during bolus administration of intravenous contrast. Multiplanar CT image reconstructions and MIPs were obtained to evaluate the vascular anatomy. Multidetector CT imaging of the abdomen and pelvis was performed using the standard protocol during bolus administration of intravenous contrast. RADIATION DOSE REDUCTION: This exam was performed according to the departmental dose-optimization program which includes automated exposure control, adjustment of the mA and/or kV according to patient size and/or use of iterative reconstruction technique. CONTRAST:  80mL OMNIPAQUE IOHEXOL 350 MG/ML SOLN COMPARISON:  Chest radiograph from earlier today. FINDINGS: CTA CHEST FINDINGS Cardiovascular: The study is moderate quality for the evaluation of pulmonary embolism, with some motion degradation. There are no filling defects in the central, lobar, segmental or subsegmental pulmonary artery branches to suggest acute pulmonary embolism. Atherosclerotic nonaneurysmal thoracic aorta. Normal caliber pulmonary arteries. Normal heart size. No significant pericardial fluid/thickening. Three-vessel coronary atherosclerosis. Mediastinum/Nodes: No significant  thyroid nodules. Unremarkable esophagus. No pathologically enlarged axillary, mediastinal or hilar lymph nodes. Lungs/Pleura: No pneumothorax. No pleural effusion. No acute consolidative airspace disease, lung masses or significant pulmonary nodules. Mild compressive atelectasis in the medial lower lobes from the large hiatal hernia. Musculoskeletal: No aggressive appearing focal osseous lesions. Healed deformities in multiple anterior left ribs. Incompletely healed posterolateral left eleventh rib fracture, probably chronic and ununited. Mild thoracic spondylosis. Review of the MIP images confirms the above findings. CT ABDOMEN and PELVIS FINDINGS Hepatobiliary: Normal liver size. Two scattered subcentimeter hypodense liver lesions, too small to characterize. Cholelithiasis. No gallbladder wall thickening or pericholecystic fluid. No biliary ductal dilatation. Pancreas:  Normal, with no mass or duct dilation. Spleen: Normal size. No mass. Adrenals/Urinary Tract: Normal adrenals. Normal kidneys with no hydronephrosis and no renal mass. Normal bladder. Stomach/Bowel: Large hiatal hernia. Stomach is nondistended and otherwise normal. Normal caliber small bowel with no small bowel wall thickening. Appendectomy. Normal large bowel with no diverticulosis, large bowel wall thickening or pericolonic fat stranding. Vascular/Lymphatic: Atherosclerotic nonaneurysmal abdominal aorta. Patent portal, splenic, hepatic and renal veins. No pathologically enlarged lymph nodes in the abdomen or pelvis. Reproductive: Status post hysterectomy, with no abnormal findings at the vaginal cuff. No adnexal mass. Other: No pneumoperitoneum. No ascites. In the midline ventral peritoneal cavity just deep to the rectus muscle sheath and associated with hernia repair mesh, there is Joline Encalada mildly heterogeneous 6.5 x 4.9 x 6.0 cm fluid collection (series 5/image 30), previously 7.1 x 2.6 x 5.6 cm on 05/27/2018 MRI, overall mildly increased.  Musculoskeletal: No aggressive appearing focal osseous lesions. Marked lower lumbar facet arthropathy and degenerative disc disease. Review of the MIP images confirms the above findings. IMPRESSION: 1. No evidence of pulmonary embolism. No acute pulmonary disease. 2. Large hiatal hernia. 3. Cholelithiasis. No evidence of acute cholecystitis. 4. No evidence of bowel obstruction or acute bowel inflammation. 5. Chronic 6.5 x 4.9 x 6.0 cm midline ventral peritoneal cavity fluid collection just deep to the rectus muscle sheath and associated with the hernia repair mesh, overall mildly increased since 2019 MRI, compatible with chronic postoperative seroma. 6. Three-vessel coronary atherosclerosis. 7. Aortic Atherosclerosis (ICD10-I70.0). Electronically Signed   By: Delbert Phenix M.D.   On: 05/09/2022 17:20   DG Chest 2 View  Result Date: 05/09/2022 CLINICAL DATA:  Mid to left-sided chest pain radiating to the left upper extremity. EXAM: CHEST - 2 VIEW COMPARISON:  05/08/2022. FINDINGS: Cardiac silhouette is normal in size. Small to moderate hiatal hernia. No mediastinal or hilar masses or evidence of adenopathy. Clear lungs.  No pleural effusion or pneumothorax. Skeletal structures are intact. IMPRESSION: No active cardiopulmonary disease. Electronically Signed   By: Amie Portland M.D.   On: 05/09/2022 12:42   DG Chest 2 View  Result Date: 05/08/2022 CLINICAL DATA:  Chest pain and epigastric pain, nausea EXAM: CHEST - 2 VIEW COMPARISON:  05/06/2022 FINDINGS: Frontal and lateral views of the chest demonstrate Nautia Lem stable cardiac silhouette. Hiatal hernia unchanged. No airspace disease, effusion, or pneumothorax. No acute bony abnormalities. IMPRESSION: 1. Stable hiatal hernia. 2. No acute intrathoracic process. Electronically Signed   By: Sharlet Salina M.D.   On: 05/08/2022 20:45   DG Chest 2 View  Result Date: 05/06/2022 CLINICAL DATA:  Chest and abdominal pain EXAM: CHEST - 2 VIEW COMPARISON:  10/11/2019  FINDINGS: Normal heart size. Moderate hiatal hernia. There is no edema, consolidation, effusion, or pneumothorax. IMPRESSION: No active cardiopulmonary disease. Hiatal hernia. Electronically Signed   By: Tiburcio Pea M.D.   On: 05/06/2022 04:53    Microbiology: Recent Results (from the past 240 hour(s))  SARS Coronavirus 2 by RT PCR (hospital order, performed in [facility] hospital lab) *cepheid single result test* Anterior Nasal Swab     Status: None   Collection Time: 05/17/22  5:00 AM   Specimen: Anterior Nasal Swab  Result Value Ref Range Status   SARS Coronavirus 2 by RT PCR NEGATIVE NEGATIVE Final    Comment: (NOTE) SARS-CoV-2 target nucleic acids are NOT DETECTED.  The SARS-CoV-2 RNA is generally detectable in upper and lower respiratory specimens during the acute phase of infection. The lowest concentration of SARS-CoV-2  viral copies this assay can detect is 250 copies / mL. Wanza Szumski negative result does not preclude SARS-CoV-2 infection and should not be used as the sole basis for treatment or other patient management decisions.  Finnleigh Marchetti negative result may occur with improper specimen collection / handling, submission of specimen other than nasopharyngeal swab, presence of viral mutation(s) within the areas targeted by this assay, and inadequate number of viral copies (<250 copies / mL). Fronnie Urton negative result must be combined with clinical observations, patient history, and epidemiological information.  Fact Sheet for Patients:   RoadLapTop.co.za  Fact Sheet for Healthcare Providers: http://kim-miller.com/  This test is not yet approved or  cleared by the [place] FDA and has been authorized for detection and/or diagnosis of SARS-CoV-2 by FDA under an Emergency Use Authorization (EUA).  This EUA will remain in effect (meaning this test can be used) for the duration of the COVID-19 declaration under Section 564(b)(1) of the Act, 21  U.S.C. section 360bbb-3(b)(1), unless the authorization is terminated or revoked sooner.  Performed at [facility], [address], [place], [place] [address]   Surgical PCR screen     Status: None   Collection Time: 05/21/22 11:20 PM   Specimen: Nasal Mucosa; Nasal Swab  Result Value Ref Range Status   MRSA, PCR NEGATIVE NEGATIVE Final   Staphylococcus aureus NEGATIVE NEGATIVE Final    Comment: (NOTE) The Xpert SA Assay (FDA approved for NASAL specimens in patients 9 years of age and older), is one component of Quentin Strebel comprehensive surveillance program. It is not intended to diagnose infection nor to guide or monitor treatment. Performed at [facility], [address] [place], [place] [address]      Labs: Basic Metabolic Panel: Recent Labs  Lab 05/17/22 0039 05/17/22 0043 05/17/22 0447 05/18/22 0147 05/19/22 0233 05/20/22 0154 05/21/22 0152 05/22/22 0205 05/23/22 0200  NA 137   < > 134* 136 135 136 138 138 138  K 3.5   < > 3.0* 2.9* 3.1* 3.7 3.4* 3.5 4.7  CL 88*  --  92* 96* 99 100 101 104 104  CO2 29  --  GLUCOSE 92  --  87 75 63* 68* 75 88 103*  BUN 23  --  22 13 7* 7* 5* 6* 5*  CREATININE 2.05*  --  1.84* 1.24* 1.05* 1.09* 1.10* 1.05* 1.10*  CALCIUM 9.3  --  9.0 8.7* 8.9 9.4 9.4 9.6 9.5  MG 1.4*  --  1.5* 1.8  --   --   --   --  1.7  PHOS 3.7  --  3.2  --   --   --   --   --  3.2   < > = values in this interval not displayed.   Liver Function Tests: Recent Labs  Lab 05/19/22 0233 05/20/22 0154 05/21/22 0152 05/22/22 0205 05/23/22 0200  AST 49*  ALT ALKPHOS 60 63 66 66 77  BILITOT 1.2 0.7 0.5 0.5 0.7  PROT 5.0* 5.3* 5.1* 5.1* 5.7*  ALBUMIN 2.8* 2.8* 2.7* 2.6* 3.0*   Recent Labs  Lab 05/19/22 0233 05/20/22 0154 05/21/22 0152 05/22/22 0205 05/23/22 0200  LIPASE 177* 181* 188* 268* 67*   No results for input(s): "AMMONIA" in the last 168 hours. CBC: Recent Labs  Lab 05/19/22 0233  05/20/22 0154 05/21/22 0152 05/22/22 0205 05/23/22 0200  WBC 4.4 3.7* 3.8*  4.0 7.0  NEUTROABS 2.8 2.3 2.2 2.1 5.7  HGB 10.0* 10.4* 10.1* 9.9* 10.7*  HCT 30.3* 31.2* 31.3* 30.3* 33.4*  MCV 79.5* 78.6* 79.4* 79.7* 81.3  PLT 194 180 212 236 258   Cardiac Enzymes: Recent Labs  Lab 05/17/22 0039  CKTOTAL 138   BNP: BNP (last 3 results) Recent Labs    05/08/22 2023  BNP 19.7    ProBNP (last 3 results) No results for input(s): "PROBNP" in the last 8760 hours.  CBG: No results for input(s): "GLUCAP" in the last 168 hours.     Signed:  Lacretia Nicks MD.  [facility] 05/23/2022, 10:41 AM

## 2022-06-21 ENCOUNTER — Observation Stay (HOSPITAL_COMMUNITY): Payer: Medicare Other

## 2022-06-21 ENCOUNTER — Encounter (HOSPITAL_COMMUNITY): Payer: Self-pay

## 2022-06-21 ENCOUNTER — Inpatient Hospital Stay (HOSPITAL_COMMUNITY)
Admission: EM | Admit: 2022-06-21 | Discharge: 2022-06-25 | DRG: 439 | Disposition: A | Discharge status: Home / Self Care | Payer: Medicare Other | Attending: Internal Medicine | Admitting: Internal Medicine

## 2022-06-21 ENCOUNTER — Emergency Department (HOSPITAL_COMMUNITY): Payer: Medicare Other

## 2022-06-21 DIAGNOSIS — Z6830 Body mass index (BMI) 30.0-30.9, adult: Secondary | ICD-10-CM

## 2022-06-21 DIAGNOSIS — K859 Acute pancreatitis without necrosis or infection, unspecified: Secondary | ICD-10-CM | POA: Diagnosis not present

## 2022-06-21 DIAGNOSIS — Z7951 Long term (current) use of inhaled steroids: Secondary | ICD-10-CM

## 2022-06-21 DIAGNOSIS — Z8249 Family history of ischemic heart disease and other diseases of the circulatory system: Secondary | ICD-10-CM

## 2022-06-21 DIAGNOSIS — Z809 Family history of malignant neoplasm, unspecified: Secondary | ICD-10-CM

## 2022-06-21 DIAGNOSIS — Z91013 Allergy to seafood: Secondary | ICD-10-CM

## 2022-06-21 DIAGNOSIS — K209 Esophagitis, unspecified without bleeding: Secondary | ICD-10-CM | POA: Diagnosis present

## 2022-06-21 DIAGNOSIS — H409 Unspecified glaucoma: Secondary | ICD-10-CM | POA: Diagnosis present

## 2022-06-21 DIAGNOSIS — Z886 Allergy status to analgesic agent status: Secondary | ICD-10-CM

## 2022-06-21 DIAGNOSIS — R1013 Epigastric pain: Secondary | ICD-10-CM | POA: Diagnosis not present

## 2022-06-21 DIAGNOSIS — D649 Anemia, unspecified: Secondary | ICD-10-CM | POA: Diagnosis present

## 2022-06-21 DIAGNOSIS — E876 Hypokalemia: Secondary | ICD-10-CM | POA: Diagnosis present

## 2022-06-21 DIAGNOSIS — K449 Diaphragmatic hernia without obstruction or gangrene: Secondary | ICD-10-CM | POA: Diagnosis present

## 2022-06-21 DIAGNOSIS — K861 Other chronic pancreatitis: Secondary | ICD-10-CM | POA: Diagnosis present

## 2022-06-21 DIAGNOSIS — F419 Anxiety disorder, unspecified: Secondary | ICD-10-CM | POA: Diagnosis present

## 2022-06-21 DIAGNOSIS — G8929 Other chronic pain: Secondary | ICD-10-CM | POA: Diagnosis present

## 2022-06-21 DIAGNOSIS — Z882 Allergy status to sulfonamides status: Secondary | ICD-10-CM

## 2022-06-21 DIAGNOSIS — R9431 Abnormal electrocardiogram [ECG] [EKG]: Secondary | ICD-10-CM

## 2022-06-21 DIAGNOSIS — I10 Essential (primary) hypertension: Secondary | ICD-10-CM | POA: Diagnosis present

## 2022-06-21 DIAGNOSIS — I959 Hypotension, unspecified: Secondary | ICD-10-CM | POA: Diagnosis present

## 2022-06-21 DIAGNOSIS — E7800 Pure hypercholesterolemia, unspecified: Secondary | ICD-10-CM | POA: Diagnosis present

## 2022-06-21 DIAGNOSIS — T501X5A Adverse effect of loop [high-ceiling] diuretics, initial encounter: Secondary | ICD-10-CM | POA: Diagnosis present

## 2022-06-21 DIAGNOSIS — E669 Obesity, unspecified: Secondary | ICD-10-CM | POA: Diagnosis present

## 2022-06-21 DIAGNOSIS — N179 Acute kidney failure, unspecified: Secondary | ICD-10-CM | POA: Diagnosis present

## 2022-06-21 DIAGNOSIS — F17210 Nicotine dependence, cigarettes, uncomplicated: Secondary | ICD-10-CM | POA: Diagnosis present

## 2022-06-21 DIAGNOSIS — F319 Bipolar disorder, unspecified: Secondary | ICD-10-CM | POA: Diagnosis present

## 2022-06-21 DIAGNOSIS — Z880 Allergy status to penicillin: Secondary | ICD-10-CM

## 2022-06-21 DIAGNOSIS — Z885 Allergy status to narcotic agent status: Secondary | ICD-10-CM

## 2022-06-21 DIAGNOSIS — K2970 Gastritis, unspecified, without bleeding: Secondary | ICD-10-CM | POA: Diagnosis present

## 2022-06-21 DIAGNOSIS — J449 Chronic obstructive pulmonary disease, unspecified: Secondary | ICD-10-CM | POA: Diagnosis present

## 2022-06-21 DIAGNOSIS — Z79899 Other long term (current) drug therapy: Secondary | ICD-10-CM

## 2022-06-21 DIAGNOSIS — E538 Deficiency of other specified B group vitamins: Secondary | ICD-10-CM | POA: Diagnosis present

## 2022-06-21 DIAGNOSIS — F418 Other specified anxiety disorders: Secondary | ICD-10-CM | POA: Diagnosis present

## 2022-06-21 DIAGNOSIS — Z888 Allergy status to other drugs, medicaments and biological substances status: Secondary | ICD-10-CM

## 2022-06-21 DIAGNOSIS — E871 Hypo-osmolality and hyponatremia: Secondary | ICD-10-CM | POA: Diagnosis present

## 2022-06-21 HISTORY — DX: Abnormal electrocardiogram (ECG) (EKG): R94.31

## 2022-06-21 LAB — CBC WITH DIFFERENTIAL/PLATELET
Abs Immature Granulocytes: 0.04 10*3/uL (ref 0.00–0.07)
Basophils Absolute: 0 10*3/uL (ref 0.0–0.1)
Basophils Relative: 0 %
Eosinophils Absolute: 0 10*3/uL (ref 0.0–0.5)
Eosinophils Relative: 0 %
HCT: 36.3 % (ref 36.0–46.0)
Hemoglobin: 11.7 g/dL — ABNORMAL LOW (ref 12.0–15.0)
Immature Granulocytes: 1 %
Lymphocytes Relative: 14 %
Lymphs Abs: 1 10*3/uL (ref 0.7–4.0)
MCH: 26.2 pg (ref 26.0–34.0)
MCHC: 32.2 g/dL (ref 30.0–36.0)
MCV: 81.4 fL (ref 80.0–100.0)
Monocytes Absolute: 0.7 10*3/uL (ref 0.1–1.0)
Monocytes Relative: 10 %
Neutro Abs: 5.3 10*3/uL (ref 1.7–7.7)
Neutrophils Relative %: 75 %
Platelets: 308 10*3/uL (ref 150–400)
RBC: 4.46 MIL/uL (ref 3.87–5.11)
RDW: 14.8 % (ref 11.5–15.5)
WBC: 7.1 10*3/uL (ref 4.0–10.5)
nRBC: 0 % (ref 0.0–0.2)

## 2022-06-21 LAB — TROPONIN I (HIGH SENSITIVITY)
Troponin I (High Sensitivity): 9 ng/L (ref <18)
Troponin I (High Sensitivity): 7 ng/L (ref <18)

## 2022-06-21 LAB — COMPREHENSIVE METABOLIC PANEL
ALT: 9 U/L (ref 0–44)
AST: 16 U/L (ref 15–41)
Albumin: 3.3 g/dL — ABNORMAL LOW (ref 3.5–5.0)
Alkaline Phosphatase: 92 U/L (ref 38–126)
Anion gap: 19 — ABNORMAL HIGH (ref 5–15)
BUN: 8 mg/dL (ref 8–23)
CO2: 25 mmol/L (ref 22–32)
Calcium: 9.1 mg/dL (ref 8.9–10.3)
Chloride: 90 mmol/L — ABNORMAL LOW (ref 98–111)
Creatinine, Ser: 1.2 mg/dL — ABNORMAL HIGH (ref 0.44–1.00)
GFR, Estimated: 48 mL/min — ABNORMAL LOW (ref >60)
Glucose, Bld: 75 mg/dL (ref 70–99)
Potassium: 2.2 mmol/L — CL (ref 3.5–5.1)
Sodium: 134 mmol/L — ABNORMAL LOW (ref 135–145)
Total Bilirubin: 2.7 mg/dL — ABNORMAL HIGH (ref 0.3–1.2)
Total Protein: 6.6 g/dL (ref 6.5–8.1)

## 2022-06-21 LAB — LIPASE, BLOOD: Lipase: 161 U/L — ABNORMAL HIGH (ref 11–51)

## 2022-06-21 LAB — MAGNESIUM: Magnesium: 2.1 mg/dL (ref 1.7–2.4)

## 2022-06-21 LAB — PHOSPHORUS: Phosphorus: 2.6 mg/dL (ref 2.5–4.6)

## 2022-06-21 MED ORDER — PANTOPRAZOLE SODIUM 40 MG PO TBEC
40.0000 mg | DELAYED_RELEASE_TABLET | Freq: Two times a day (BID) | ORAL | Status: DC
  Administered 2022-06-22: 40 mg via ORAL
  Filled 2022-06-21: qty 1

## 2022-06-21 MED ORDER — VENLAFAXINE HCL ER 37.5 MG PO CP24
37.5000 mg | ORAL_CAPSULE | Freq: Every day | ORAL | Status: DC
  Administered 2022-06-21 – 2022-06-22 (×2): 37.5 mg via ORAL
  Filled 2022-06-21 (×2): qty 1

## 2022-06-21 MED ORDER — PANTOPRAZOLE SODIUM 40 MG IV SOLR: 40.0000 mg | INTRAVENOUS | Status: DC

## 2022-06-21 MED ORDER — ACETAMINOPHEN 650 MG RE SUPP: 650.0000 mg | Freq: Four times a day (QID) | RECTAL | Status: DC | PRN

## 2022-06-21 MED ORDER — POLYETHYLENE GLYCOL 3350 17 GM/SCOOP PO POWD
17.0000 g | Freq: Every day | ORAL | Status: DC
  Filled 2022-06-21: qty 255

## 2022-06-21 MED ORDER — MOMETASONE FURO-FORMOTEROL FUM 200-5 MCG/ACT IN AERO
2.0000 | INHALATION_SPRAY | Freq: Two times a day (BID) | RESPIRATORY_TRACT | Status: DC
  Administered 2022-06-21 – 2022-06-25 (×8): 2 via RESPIRATORY_TRACT
  Filled 2022-06-21: qty 8.8

## 2022-06-21 MED ORDER — RISPERIDONE 1 MG PO TABS
1.0000 mg | ORAL_TABLET | Freq: Every day | ORAL | Status: DC
  Administered 2022-06-21 – 2022-06-24 (×4): 1 mg via ORAL
  Filled 2022-06-21 (×4): qty 1

## 2022-06-21 MED ORDER — POTASSIUM CHLORIDE IN NACL 40-0.9 MEQ/L-% IV SOLN
INTRAVENOUS | Status: AC
  Filled 2022-06-21: qty 1000

## 2022-06-21 MED ORDER — PREGABALIN 50 MG PO CAPS
100.0000 mg | ORAL_CAPSULE | Freq: Two times a day (BID) | ORAL | Status: DC
  Administered 2022-06-21 – 2022-06-23 (×4): 100 mg via ORAL
  Filled 2022-06-21 (×4): qty 2

## 2022-06-21 MED ORDER — SODIUM CHLORIDE (PF) 0.9 % IJ SOLN
INTRAMUSCULAR | Status: AC
  Filled 2022-06-21: qty 50

## 2022-06-21 MED ORDER — PANTOPRAZOLE SODIUM 40 MG IV SOLR
40.0000 mg | Freq: Once | INTRAVENOUS | Status: AC
  Administered 2022-06-21: 40 mg via INTRAVENOUS
  Filled 2022-06-21: qty 10

## 2022-06-21 MED ORDER — DORZOLAMIDE HCL 2 % OP SOLN
1.0000 [drp] | Freq: Two times a day (BID) | OPHTHALMIC | Status: DC
  Administered 2022-06-21 – 2022-06-25 (×8): 1 [drp] via OPHTHALMIC
  Filled 2022-06-21: qty 10

## 2022-06-21 MED ORDER — ONDANSETRON HCL 4 MG/2ML IJ SOLN: 4.0000 mg | Freq: Four times a day (QID) | INTRAMUSCULAR | Status: DC | PRN

## 2022-06-21 MED ORDER — NICOTINE 21 MG/24HR TD PT24: 21.0000 mg | MEDICATED_PATCH | Freq: Every day | TRANSDERMAL | Status: DC | PRN

## 2022-06-21 MED ORDER — POTASSIUM CHLORIDE 10 MEQ/100ML IV SOLN
10.0000 meq | Freq: Once | INTRAVENOUS | Status: AC
  Administered 2022-06-21: 10 meq via INTRAVENOUS
  Filled 2022-06-21: qty 100

## 2022-06-21 MED ORDER — SERTRALINE HCL 100 MG PO TABS
100.0000 mg | ORAL_TABLET | Freq: Every day | ORAL | Status: DC
  Administered 2022-06-21 – 2022-06-24 (×4): 100 mg via ORAL
  Filled 2022-06-21 (×4): qty 1

## 2022-06-21 MED ORDER — IRBESARTAN 300 MG PO TABS: 300.0000 mg | ORAL_TABLET | Freq: Every day | ORAL | Status: DC

## 2022-06-21 MED ORDER — MAGNESIUM SULFATE 2 GM/50ML IV SOLN
2.0000 g | Freq: Once | INTRAVENOUS | Status: AC
  Administered 2022-06-21: 2 g via INTRAVENOUS
  Filled 2022-06-21: qty 50

## 2022-06-21 MED ORDER — ONDANSETRON HCL 4 MG PO TABS: 4.0000 mg | ORAL_TABLET | Freq: Four times a day (QID) | ORAL | Status: DC | PRN

## 2022-06-21 MED ORDER — LATANOPROST 0.005 % OP SOLN
1.0000 [drp] | Freq: Every day | OPHTHALMIC | Status: DC
  Administered 2022-06-21 – 2022-06-24 (×4): 1 [drp] via OPHTHALMIC
  Filled 2022-06-21: qty 2.5

## 2022-06-21 MED ORDER — FESOTERODINE FUMARATE ER 4 MG PO TB24
4.0000 mg | ORAL_TABLET | Freq: Every day | ORAL | Status: DC
  Filled 2022-06-21: qty 1

## 2022-06-21 MED ORDER — POLYETHYLENE GLYCOL 3350 17 G PO PACK: 17.0000 g | PACK | Freq: Every day | ORAL | Status: DC

## 2022-06-21 MED ORDER — PROCHLORPERAZINE EDISYLATE 10 MG/2ML IJ SOLN
10.0000 mg | Freq: Four times a day (QID) | INTRAMUSCULAR | Status: DC | PRN
  Administered 2022-06-21 – 2022-06-22 (×2): 10 mg via INTRAVENOUS
  Filled 2022-06-21 (×2): qty 2

## 2022-06-21 MED ORDER — IOHEXOL 300 MG/ML  SOLN
100.0000 mL | Freq: Once | INTRAMUSCULAR | Status: AC | PRN
  Administered 2022-06-21: 100 mL via INTRAVENOUS

## 2022-06-21 MED ORDER — POTASSIUM CHLORIDE IN NACL 40-0.9 MEQ/L-% IV SOLN
INTRAVENOUS | Status: DC
  Filled 2022-06-21 (×3): qty 1000

## 2022-06-21 MED ORDER — SUCRALFATE 1 GM/10ML PO SUSP
1.0000 g | Freq: Four times a day (QID) | ORAL | Status: DC
  Administered 2022-06-21 – 2022-06-25 (×14): 1 g via ORAL
  Filled 2022-06-21 (×15): qty 10

## 2022-06-21 MED ORDER — HYDROMORPHONE HCL 1 MG/ML IJ SOLN
1.0000 mg | INTRAMUSCULAR | Status: DC | PRN
  Administered 2022-06-21 – 2022-06-22 (×3): 1 mg via INTRAVENOUS
  Filled 2022-06-21 (×3): qty 1

## 2022-06-21 MED ORDER — SIMVASTATIN 40 MG PO TABS
40.0000 mg | ORAL_TABLET | Freq: Every day | ORAL | Status: DC
  Administered 2022-06-21: 40 mg via ORAL
  Filled 2022-06-21: qty 1

## 2022-06-21 MED ORDER — ACETAMINOPHEN 325 MG PO TABS: 650.0000 mg | ORAL_TABLET | Freq: Four times a day (QID) | ORAL | Status: DC | PRN

## 2022-06-21 NOTE — H&P (Signed)
History and Physical    Patient: Stephanie Benton YQM:[id number] DOB: [birth year] DOA: 06/21/2022 DOS: the patient was seen and examined on 06/21/2022 PCP: Sharmon Revere, MD  Patient coming from: Home  Chief Complaint:  Chief Complaint  Patient presents with   Abdominal Pain   HPI: Stephanie Benton is a 71 y.o. female with medical history significant of hyperlipidemia, recurrent pancreatitis, chronic abdominal pain, lumbar spine spondylosis, metabolic acidosis, hyperkalemia, hypertension, hyperlipidemia depression with anxiety, bipolar 1 disorder, B12 deficiency, COPD, tobacco use who is coming to the emergency department due to abdominal pain for the past 5 days as associated with multiple episodes of emesis and constipation.  She had a cholecystectomy last month.  No melena or hematochezia. No fever, chills or night sweats. No sore throat, rhinorrhea, dyspnea, wheezing or hemoptysis.  No chest pain, palpitations, diaphoresis, PND, orthopnea or pitting edema of the lower extremities. No flank pain, dysuria, frequency or hematuria.  No polyuria, polydipsia, polyphagia or blurred vision.  ED course: Initial vital signs were temperature 98.4 F, pulse 91, respirations 16, BP 106/90 mmHg O2 sat 100% on room air.  The patient received pantoprazole 40 mg IVP, KCl 10 mEq IVPB x2.  This is a 4-hour bolus.    Lab work: Her CBC showed a white count 7.1, hemoglobin 11.7 g/dL platelets 528.  Troponin x2 negative.  Lipase 161.  CMP with a sodium of 134, potassium 2.2 and chloride 90 mmol/L.  Her anion gap was 19.  CO2 and calcium were normal.  Glucose 75, BUN 8, creatinine 1.2 and total bilirubin 2.7 mg/dL.  Her albumin level is 3.3 g/dL.  Imaging: Portable 1 view chest radiograph with stable large hiatal hernia but no acute cardiopulmonary disease.  CT abdomen/pelvis with contrast with no suspicious CT etiology for acute abdominal pain.  Again large hiatal hernia seen.   Review of Systems: As mentioned in  the history of present illness. All other systems reviewed and are negative. Past Medical History:  Diagnosis Date   Hyperlipidemia    Hypertension    Past Surgical History:  Procedure Laterality Date   ABDOMINAL HYSTERECTOMY     APPENDECTOMY     BIOPSY  05/17/2022   Procedure: BIOPSY;  Surgeon: Jeani Hawking, MD;  Location: [facility] ENDOSCOPY;  Service: Gastroenterology;;   CHOLECYSTECTOMY N/A 05/22/2022   Procedure: LAPAROSCOPIC CHOLECYSTECTOMY WITH INTRAOPERATIVE CHOLANGIOGRAM;  Surgeon: Abigail Miyamoto, MD;  Location: [facility] OR;  Service: General;  Laterality: N/A;   ESOPHAGOGASTRODUODENOSCOPY (EGD) WITH PROPOFOL N/A 05/17/2022   Procedure: ESOPHAGOGASTRODUODENOSCOPY (EGD) WITH PROPOFOL;  Surgeon: Jeani Hawking, MD;  Location: [facility] ENDOSCOPY;  Service: Gastroenterology;  Laterality: N/A;   HERNIA REPAIR     TONSILLECTOMY     TUBAL LIGATION     Social History:  reports that she has been smoking cigarettes. She has been smoking an average of .75 packs per day. She has never used smokeless tobacco. She reports that she does not drink alcohol and does not use drugs.  Allergies  Allergen Reactions   Fish-Derived Products Anaphylaxis   Morphine And Related Anaphylaxis and Swelling   Belsomra [Suvorexant] Other (See Comments)    Caused nightmares   Codeine Nausea And Vomiting   Fentanyl Hives   Ibuprofen Nausea Only and Other (See Comments)    Severe nausea- history of stomach ulcers   Nsaids Other (See Comments)    History of stomach ulcers   Other Other (See Comments)    History of stomach ulcers   Penicillins Hives  Did it involve swelling of the face/tongue/throat, SOB, or low BP? Yes Did it involve sudden or severe rash/hives, skin peeling, or any reaction on the inside of your mouth or nose? Yes Did you need to seek medical attention at a hospital or doctor's office? Yes When did it last happen? More than 10 years ago If all above answers are "NO", may proceed with cephalosporin  use.    Sulfa Antibiotics Swelling    Site of swelling not recalled   Tolmetin Other (See Comments)    History of stomach ulcers    Family History  Problem Relation Age of Onset   Heart failure Mother    Cancer Father    Cancer Sister     Prior to Admission medications   Medication Sig Start Date End Date Taking? Authorizing Provider  albuterol (PROVENTIL) (2.5 MG/3ML) 0.083% nebulizer solution Take 3 mLs (2.5 mg total) by nebulization every 6 (six) hours as needed for wheezing or shortness of breath. 11/27/14   Elson Areas, PA-C  albuterol (VENTOLIN HFA) 108 (90 Base) MCG/ACT inhaler Inhale 2 puffs into the lungs every 6 (six) hours as needed for wheezing or shortness of breath.    [provider]  cyanocobalamin (,VITAMIN B-12,) 1000 MCG/ML injection Inject 1,000 mcg into the muscle every 30 (thirty) days.  06/08/19   [provider]  diclofenac sodium (VOLTAREN) 1 % GEL Apply 2-4 g topically 4 (four) times daily as needed (for pain).  12/04/17   [provider]  dorzolamide (TRUSOPT) 2 % ophthalmic solution Place 1 drop into both eyes 2 (two) times daily.    [provider]  FEROSUL 325 (65 Fe) MG tablet Take 325 mg by mouth 3 (three) times daily. 01/24/22   [provider]  latanoprost (XALATAN) 0.005 % ophthalmic solution Place 1 drop into both eyes at bedtime. 02/16/18   [provider]  LYRICA 100 MG capsule Take 100 mg by mouth 2 (two) times daily. 05/05/13   [provider]  nicotine (NICODERM CQ - DOSED IN MG/24 HOURS) 21 mg/24hr patch Place 1 patch (21 mg total) onto the skin daily. Patient not taking: Reported on 10/11/2019 02/19/18   Dhungel, Theda Belfast, MD  pantoprazole (PROTONIX) 40 MG tablet Take 1 tablet (40 mg total) by mouth 2 (two) times daily before a meal. 05/23/22 07/22/22  Zigmund Daniel., MD  polyethylene glycol powder (GLYCOLAX/MIRALAX) 17 GM/SCOOP powder Take 1 cap full (17 g) with water by mouth  daily. 05/24/22   Zigmund Daniel., MD  risperiDONE (RISPERDAL) 1 MG tablet Take 1 mg by mouth at bedtime.  11/24/14   [provider]  sertraline (ZOLOFT) 100 MG tablet Take 100 mg by mouth at bedtime.  05/05/13   [provider]  simvastatin (ZOCOR) 40 MG tablet Take 40 mg by mouth at bedtime.  05/09/15   [provider]  sucralfate (CARAFATE) 1 GM/10ML suspension Take 10 mLs (1 g total) by mouth 4 (four) times daily. 05/23/22 06/22/22  Zigmund Daniel., MD  SYMBICORT 160-4.5 MCG/ACT inhaler Inhale 1 puff into the lungs 2 (two) times daily as needed (for flares).  01/28/18   [provider]  telmisartan (MICARDIS) 80 MG tablet Take 80 mg by mouth at bedtime.  06/24/18   [provider]  tolterodine (DETROL LA) 2 MG 24 hr capsule Take 2 mg by mouth daily. 05/15/22   [provider]  torsemide (DEMADEX) 10 MG tablet Take 10 mg by mouth  daily as needed (for swelling in legs). 05/06/22   [provider]  traMADol (ULTRAM) 50 MG tablet Take 1 tablet (50 mg total) by mouth every 6 (six) hours as needed for severe pain. 05/23/22   Meuth, Brooke A, PA-C  venlafaxine XR (EFFEXOR-XR) 37.5 MG 24 hr capsule Take 37.5 mg by mouth at bedtime.  05/11/15   [provider]    Physical Exam: Vitals:   06/21/22 1153 06/21/22 1300 06/21/22 1458 06/21/22 1603  BP: (!) 106/90 137/89 120/75   Pulse: 91 98 87   Resp: 16 14 13    Temp: 98.4 F (36.9 C)   98.5 F (36.9 C)  TempSrc: Oral   Oral  SpO2: 100% 100% 100%    Physical Exam Vitals and nursing note reviewed.  Constitutional:      General: She is awake. She is not in acute distress.    Comments: Chronically ill-appearing.  HENT:     Head: Normocephalic.     Nose: No rhinorrhea.     Mouth/Throat:     Mouth: Mucous membranes are dry.  Eyes:     General: No scleral icterus.    Pupils: Pupils are equal, round, and reactive to light.  Cardiovascular:     Rate and Rhythm:  Normal rate and regular rhythm.  Pulmonary:     Effort: Pulmonary effort is normal.     Breath sounds: Normal breath sounds.  Abdominal:     General: Bowel sounds are normal.     Palpations: Abdomen is soft.     Tenderness: There is abdominal tenderness in the right upper quadrant. There is right CVA tenderness. There is no left CVA tenderness, guarding or rebound.  Musculoskeletal:     Cervical back: Neck supple.     Right lower leg: No edema.     Left lower leg: No edema.  Skin:    General: Skin is warm and dry.  Neurological:     General: No focal deficit present.     Mental Status: She is alert and oriented to person, place, and time.  Psychiatric:        Mood and Affect: Mood normal.        Behavior: Behavior normal. Behavior is cooperative.   Data Reviewed:  Results are pending, will review when available.  Assessment and Plan: Principal Problem:   Prolonged QT interval In the setting of:   Hypokalemia Secondary to torsemide use. Observation/telemetry. Parenteral and oral potassium supplementation. Magnesium sulfate 2 g IVPB x1. Follow potassium level. Will need regular K supplement if on torsemide.  Active Problems:   Pancreatitis Mild elevation of the lipase. CT abdomen/pelvis with no acute finding. Continue IV hydration. Analgesics as needed.   Antiemetics as needed. Follow lipase in the morning.  Glaucoma    Hypertension Continue telmisartan 80 mg or formulary equivalent.    Depression with anxiety Continue Zoloft 100 mg p.o. bedtime. Continue venlafaxine 37.5 mg in the morning. Continue risperidone 1 mg p.o. bedtime. Follow-up with primary care provider and behavioral health.      Hypercholesterolemia Continue simvastatin 40 mg p.o. daily.    Cigarette smoker   COPD (chronic obstructive pulmonary disease) (HCC) Continue Symbicort or formulary equivalent.   Short acting bronchodilators as needed. Tobacco cessation advised.    Advance Care  Planning:   Code Status: Full Code   Consults:   Family Communication:   Severity of Illness: The appropriate patient status for this patient is OBSERVATION. Observation status is judged to be reasonable  and necessary in order to provide the required intensity of service to ensure the patient's safety. The patient's presenting symptoms, physical exam findings, and initial radiographic and laboratory data in the context of their medical condition is felt to place them at decreased risk for further clinical deterioration. Furthermore, it is anticipated that the patient will be medically stable for discharge from the hospital within 2 midnights of admission.   Author: Bobette Mo, MD 06/21/2022 4:09 PM  For on call review [email or web address].   This document was prepared using Dragon voice recognition software and may contain some unintended transcription errors.

## 2022-06-21 NOTE — ED Triage Notes (Signed)
Pt arrived via EMS, from home, c/o epigastric pain, constipation. Denies any vomiting.

## 2022-06-21 NOTE — ED Provider Notes (Signed)
[facility] Provider Note   CSN: [id number] Arrival date & time: 06/21/22  1144     History  Chief Complaint  Patient presents with   Abdominal Pain    Stephanie Benton is a 71 y.o. female.  Patient presents with abdominal pain she is 1 month status post gallbladder surgery.  She also has been vomiting  The history is provided by the patient and medical records. No language interpreter was used.  Abdominal Pain Pain location:  Epigastric Pain quality: aching   Pain radiates to:  Does not radiate Pain severity:  Mild Onset quality:  Sudden Timing:  Constant Progression:  Waxing and waning Chronicity:  New Context: not alcohol use   Associated symptoms: no chest pain, no cough, no diarrhea, no fatigue and no hematuria        Home Medications Prior to Admission medications   Medication Sig Start Date End Date Taking? Authorizing Provider  albuterol (PROVENTIL) (2.5 MG/3ML) 0.083% nebulizer solution Take 3 mLs (2.5 mg total) by nebulization every 6 (six) hours as needed for wheezing or shortness of breath. 11/27/14   Elson Areas, PA-C  albuterol (VENTOLIN HFA) 108 (90 Base) MCG/ACT inhaler Inhale 2 puffs into the lungs every 6 (six) hours as needed for wheezing or shortness of breath.    [provider]  cyanocobalamin (,VITAMIN B-12,) 1000 MCG/ML injection Inject 1,000 mcg into the muscle every 30 (thirty) days.  06/08/19   [provider]  diclofenac sodium (VOLTAREN) 1 % GEL Apply 2-4 g topically 4 (four) times daily as needed (for pain).  12/04/17   [provider]  dorzolamide (TRUSOPT) 2 % ophthalmic solution Place 1 drop into both eyes 2 (two) times daily.    [provider]  FEROSUL 325 (65 Fe) MG tablet Take 325 mg by mouth 3 (three) times daily. 01/24/22   [provider]  latanoprost (XALATAN) 0.005 % ophthalmic solution Place 1 drop into both eyes at bedtime. 02/16/18   [provider]  LYRICA 100 MG capsule Take 100 mg by mouth 2 (two) times daily. 05/05/13   [provider]  nicotine (NICODERM CQ - DOSED IN MG/24 HOURS) 21 mg/24hr patch Place 1 patch (21 mg total) onto the skin daily. Patient not taking: Reported on 10/11/2019 02/19/18   Dhungel, Theda Belfast, MD  pantoprazole (PROTONIX) 40 MG tablet Take 1 tablet (40 mg total) by mouth 2 (two) times daily before a meal. 05/23/22 07/22/22  Zigmund Daniel., MD  polyethylene glycol powder (GLYCOLAX/MIRALAX) 17 GM/SCOOP powder Take 1 cap full (17 g) with water by mouth daily. 05/24/22   Zigmund Daniel., MD  risperiDONE (RISPERDAL) 1 MG tablet Take 1 mg by mouth at bedtime.  11/24/14   [provider]  sertraline (ZOLOFT) 100 MG tablet Take 100 mg by mouth at bedtime.  05/05/13   [provider]  simvastatin (ZOCOR) 40 MG tablet Take 40 mg by mouth at bedtime.  05/09/15   [provider]  sucralfate (CARAFATE) 1 GM/10ML suspension Take 10 mLs (1 g total) by mouth 4 (four) times daily. 05/23/22 06/22/22  Zigmund Daniel., MD  SYMBICORT 160-4.5 MCG/ACT inhaler Inhale 1 puff into the lungs 2 (two) times daily as needed (for flares).  01/28/18   [provider]  telmisartan (MICARDIS) 80 MG tablet Take 80 mg by mouth at bedtime.  06/24/18   [provider]  tolterodine (DETROL LA) 2 MG 24  hr capsule Take 2 mg by mouth daily. 05/15/22   [provider]  torsemide (DEMADEX) 10 MG tablet Take 10 mg by mouth daily as needed (for swelling in legs). 05/06/22   [provider]  traMADol (ULTRAM) 50 MG tablet Take 1 tablet (50 mg total) by mouth every 6 (six) hours as needed for severe pain. 05/23/22   Meuth, Brooke A, PA-C  venlafaxine XR (EFFEXOR-XR) 37.5 MG 24 hr capsule Take 37.5 mg by mouth at bedtime.  05/11/15   [provider]      Allergies    Fish-derived products, Morphine and related, Belsomra [suvorexant], Codeine, Fentanyl,  Ibuprofen, Nsaids, Other, Penicillins, Sulfa antibiotics, and Tolmetin    Review of Systems   Review of Systems  Constitutional:  Negative for appetite change and fatigue.  HENT:  Negative for congestion, ear discharge and sinus pressure.   Eyes:  Negative for discharge.  Respiratory:  Negative for cough.   Cardiovascular:  Negative for chest pain.  Gastrointestinal:  Positive for abdominal pain. Negative for diarrhea.  Genitourinary:  Negative for frequency and hematuria.  Musculoskeletal:  Negative for back pain.  Skin:  Negative for rash.  Neurological:  Negative for seizures and headaches.  Psychiatric/Behavioral:  Negative for hallucinations.     Physical Exam Updated Vital Signs BP 116/72 (BP Location: Left Arm)   Pulse 90   Temp 98.6 F (37 C) (Oral)   Resp 20   SpO2 99%  Physical Exam Vitals and nursing note reviewed.  Constitutional:      Appearance: She is well-developed.  HENT:     Head: Normocephalic.     Nose: Nose normal.  Eyes:     General: No scleral icterus.    Conjunctiva/sclera: Conjunctivae normal.  Neck:     Thyroid: No thyromegaly.  Cardiovascular:     Rate and Rhythm: Normal rate and regular rhythm.     Heart sounds: No murmur heard.    No friction rub. No gallop.  Pulmonary:     Breath sounds: No stridor. No wheezing or rales.  Chest:     Chest wall: No tenderness.  Abdominal:     General: There is no distension.     Tenderness: There is abdominal tenderness. There is no rebound.  Musculoskeletal:        General: Normal range of motion.     Cervical back: Neck supple.  Lymphadenopathy:     Cervical: No cervical adenopathy.  Skin:    Findings: No erythema or rash.  Neurological:     Mental Status: She is alert and oriented to person, place, and time.     Motor: No abnormal muscle tone.     Coordination: Coordination normal.  Psychiatric:        Behavior: Behavior normal.     ED Results / Procedures / Treatments   Labs (all labs  ordered are listed, but only abnormal results are displayed) Labs Reviewed  CBC WITH DIFFERENTIAL/PLATELET - Abnormal; Notable for the following components:      Result Value   Hemoglobin 11.7 (*)    All other components within normal limits  COMPREHENSIVE METABOLIC PANEL - Abnormal; Notable for the following components:   Sodium 134 (*)    Potassium 2.2 (*)    Chloride 90 (*)    Creatinine, Ser 1.20 (*)    Albumin 3.3 (*)    Total Bilirubin 2.7 (*)    GFR, Estimated 48 (*)    Anion gap 19 (*)  All other components within normal limits  LIPASE, BLOOD - Abnormal; Notable for the following components:   Lipase 161 (*)    All other components within normal limits  MAGNESIUM  PHOSPHORUS  COMPREHENSIVE METABOLIC PANEL  CBC  TROPONIN I (HIGH SENSITIVITY)  TROPONIN I (HIGH SENSITIVITY)    EKG None  Radiology CT ABDOMEN PELVIS W CONTRAST  Result Date: 06/21/2022 CLINICAL DATA:  Abdominal pain, acute, nonlocalized EXAM: CT ABDOMEN AND PELVIS WITH CONTRAST TECHNIQUE: Multidetector CT imaging of the abdomen and pelvis was performed using the standard protocol following bolus administration of intravenous contrast. RADIATION DOSE REDUCTION: This exam was performed according to the departmental dose-optimization program which includes automated exposure control, adjustment of the mA and/or kV according to patient size and/or use of iterative reconstruction technique. CONTRAST:  OMNIPAQUE IOHEXOL 300 MG/ML  SOLN COMPARISON:  CT dated May 09, 2022 FINDINGS: Lower chest: No acute abnormality. Hepatobiliary: Status post cholecystectomy presumed postsurgical changes along the inferior gallbladder fossa. No new findings within the liver. Subcentimeter hypodense lesions are too small to accurately characterize. Pancreas: Unremarkable. No pancreatic ductal dilatation or surrounding inflammatory changes. Spleen: Normal in size without focal abnormality. Adrenals/Urinary Tract: Adrenal glands  are unremarkable. No hydronephrosis. Kidneys enhance symmetrically. No obstructing nephrolithiasis. Bladder is unremarkable. Stomach/Bowel: No evidence of bowel obstruction. Large hiatal hernia. Status post appendectomy. Vascular/Lymphatic: Atherosclerotic calcifications of the nonaneurysmal aorta. No new lymphadenopathy. Reproductive: Status post hysterectomy. No adnexal masses. Other: Revisualization of a fluid collection along the posterior abdominal muscular sure in the mid abdomen. This measures 6.5 x 4.9 cm, unchanged in comparison to prior and likely reflects a chronic seroma. Fat necrosis of the anterior wall soft tissues. Status post midline laparotomy. No free air or free fluid. Musculoskeletal: Revisualization of partial lumbarization of S1. Similar degree of grade 1 anterolisthesis at L5-S1, likely due to facet degenerative changes. Remote rib fractures. IMPRESSION: 1. No suspicious CT etiology for acute abdominal pain is identified. 2. Large hiatal hernia. Aortic Atherosclerosis (ICD10-I70.0). Electronically Signed   By: Meda Klinefelter M.D.   On: 06/21/2022 15:16   DG Chest Port 1 View  Result Date: 06/21/2022 CLINICAL DATA:  Weakness.  Epigastric pain. EXAM: PORTABLE CHEST 1 VIEW COMPARISON:  One-view chest x-ray 05/16/2022 FINDINGS: Heart size is normal. Atherosclerotic changes are present at the aortic arch. A large hiatal hernia is again noted. No edema or effusion is present. No focal airspace disease is present. Degenerative changes are present at the right shoulder. IMPRESSION: 1. No acute cardiopulmonary disease. 2. Stable large hiatal hernia. Electronically Signed   By: Marin Roberts M.D.   On: 06/21/2022 12:58    Procedures Procedures    Medications Ordered in ED Medications  magnesium sulfate IVPB 2 g 50 mL (2 g Intravenous New Bag/Given 06/21/22 1640)  0.9 % NaCl with KCl 40 mEq / L  infusion (has no administration in time range)  HYDROmorphone (DILAUDID) injection  1 mg (1 mg Intravenous Given 06/21/22 1635)  pantoprazole (PROTONIX) injection 40 mg (has no administration in time range)  0.9 % NaCl with KCl 40 mEq / L  infusion (has no administration in time range)  pantoprazole (PROTONIX) injection 40 mg (40 mg Intravenous Given 06/21/22 1303)  potassium chloride 10 mEq in 100 mL IVPB (0 mEq Intravenous Stopped 06/21/22 1600)  potassium chloride 10 mEq in 100 mL IVPB (10 mEq Intravenous New Bag/Given 06/21/22 1555)  sodium chloride (PF) 0.9 % injection (  Given by Other 06/21/22 1500)  iohexol (OMNIPAQUE) 300  MG/ML solution 100 mL (100 mLs Intravenous Contrast Given 06/21/22 1437)    ED Course/ Medical Decision Making/ A&P  CRITICAL CARE Performed by: Bethann Berkshire Total critical care time: 40 minutes Critical care time was exclusive of separately billable procedures and treating other patients. Critical care was necessary to treat or prevent imminent or life-threatening deterioration. Critical care was time spent personally by me on the following activities: development of treatment plan with patient and/or surrogate as well as nursing, discussions with consultants, evaluation of patient's response to treatment, examination of patient, obtaining history from patient or surrogate, ordering and performing treatments and interventions, ordering and review of laboratory studies, ordering and review of radiographic studies, pulse oximetry and re-evaluation of patient's condition.                          Medical Decision Making Amount and/or Complexity of Data Reviewed Labs: ordered. Radiology: ordered. ECG/medicine tests: ordered.  Risk Prescription drug management. Decision regarding hospitalization.   This patient presents to the ED for concern of abdominal pain, this involves an extensive number of treatment options, and is a complaint that carries with it a high risk of complications and morbidity.  The differential diagnosis includes postop  complication, pancreatitis, gastritis   Co morbidities that complicate the patient evaluation  Pancreatitis and previous gallbladder surgery   Additional history obtained:  Additional history obtained from patient External records from outside source obtained and reviewed including hospital records   Lab Tests:  I Ordered, and personally interpreted labs.  The pertinent results include: Potassium 2.2, white count 7.1   Imaging Studies ordered:  I ordered imaging studies including CT abdomen I independently visualized and interpreted imaging which showed unremarkable I agree with the radiologist interpretation   Cardiac Monitoring: / EKG:  The patient was maintained on a cardiac monitor.  I personally viewed and interpreted the cardiac monitored which showed an underlying rhythm of: Normal sinus rhythm   Consultations Obtained:  I requested consultation with the hospitalist,  and discussed lab and imaging findings as well as pertinent plan - they recommend: Admit   Problem List / ED Course / Critical interventions / Medication management  Abdominal pain and hypokalemia I ordered medication including potassium for hypokalemia Reevaluation of the patient after these medicines showed that the patient stayed the same I have reviewed the patients home medicines and have made adjustments as needed   Social Determinants of Health:  None   Test / Admission - Considered:  None   Patient with abdominal pain, hypokalemia, pancreatitis.  She will be admitted to medicine        Final Clinical Impression(s) / ED Diagnoses Final diagnoses:  None    Rx / DC Orders ED Discharge Orders     None         Bethann Berkshire, MD 06/21/22 1733

## 2022-06-22 ENCOUNTER — Other Ambulatory Visit: Payer: Self-pay

## 2022-06-22 DIAGNOSIS — E876 Hypokalemia: Secondary | ICD-10-CM | POA: Diagnosis not present

## 2022-06-22 LAB — CBC
HCT: 32.8 % — ABNORMAL LOW (ref 36.0–46.0)
Hemoglobin: 10.4 g/dL — ABNORMAL LOW (ref 12.0–15.0)
MCH: 26.8 pg (ref 26.0–34.0)
MCHC: 31.7 g/dL (ref 30.0–36.0)
MCV: 84.5 fL (ref 80.0–100.0)
Platelets: 270 10*3/uL (ref 150–400)
RBC: 3.88 MIL/uL (ref 3.87–5.11)
RDW: 15.5 % (ref 11.5–15.5)
WBC: 6.8 10*3/uL (ref 4.0–10.5)
nRBC: 0 % (ref 0.0–0.2)

## 2022-06-22 LAB — COMPREHENSIVE METABOLIC PANEL
ALT: 9 U/L (ref 0–44)
AST: 13 U/L — ABNORMAL LOW (ref 15–41)
Albumin: 2.8 g/dL — ABNORMAL LOW (ref 3.5–5.0)
Alkaline Phosphatase: 82 U/L (ref 38–126)
Anion gap: 13 (ref 5–15)
BUN: 7 mg/dL — ABNORMAL LOW (ref 8–23)
CO2: 22 mmol/L (ref 22–32)
Calcium: 8.3 mg/dL — ABNORMAL LOW (ref 8.9–10.3)
Chloride: 99 mmol/L (ref 98–111)
Creatinine, Ser: 0.82 mg/dL (ref 0.44–1.00)
GFR, Estimated: >60 mL/min (ref >60)
Glucose, Bld: 87 mg/dL (ref 70–99)
Potassium: 3.1 mmol/L — ABNORMAL LOW (ref 3.5–5.1)
Sodium: 134 mmol/L — ABNORMAL LOW (ref 135–145)
Total Bilirubin: 2.2 mg/dL — ABNORMAL HIGH (ref 0.3–1.2)
Total Protein: 5.7 g/dL — ABNORMAL LOW (ref 6.5–8.1)

## 2022-06-22 LAB — LIPASE, BLOOD: Lipase: 106 U/L — ABNORMAL HIGH (ref 11–51)

## 2022-06-22 MED ORDER — PANTOPRAZOLE SODIUM 40 MG IV SOLR
40.0000 mg | Freq: Two times a day (BID) | INTRAVENOUS | Status: AC
  Administered 2022-06-22 (×2): 40 mg via INTRAVENOUS
  Filled 2022-06-22 (×2): qty 10

## 2022-06-22 MED ORDER — OXYCODONE HCL 5 MG PO TABS: 5.0000 mg | ORAL_TABLET | ORAL | Status: DC | PRN

## 2022-06-22 MED ORDER — POTASSIUM CHLORIDE CRYS ER 20 MEQ PO TBCR
40.0000 meq | EXTENDED_RELEASE_TABLET | ORAL | Status: DC
  Administered 2022-06-22: 40 meq via ORAL
  Filled 2022-06-22: qty 2

## 2022-06-22 MED ORDER — KETOROLAC TROMETHAMINE 15 MG/ML IJ SOLN
15.0000 mg | Freq: Three times a day (TID) | INTRAMUSCULAR | Status: DC
  Administered 2022-06-22 – 2022-06-23 (×3): 15 mg via INTRAVENOUS
  Filled 2022-06-22 (×3): qty 1

## 2022-06-22 MED ORDER — POTASSIUM CHLORIDE 10 MEQ/100ML IV SOLN
10.0000 meq | INTRAVENOUS | Status: AC
  Administered 2022-06-22 (×6): 10 meq via INTRAVENOUS
  Filled 2022-06-22: qty 100
  Filled 2022-06-22: qty 200
  Filled 2022-06-22: qty 100
  Filled 2022-06-22: qty 200
  Filled 2022-06-22: qty 100

## 2022-06-22 MED ORDER — POTASSIUM CHLORIDE 10 MEQ/100ML IV SOLN
INTRAVENOUS | Status: AC
  Filled 2022-06-22: qty 100

## 2022-06-22 MED ORDER — HYDROMORPHONE HCL 1 MG/ML IJ SOLN
0.5000 mg | INTRAMUSCULAR | Status: DC | PRN
  Administered 2022-06-24: 0.5 mg via INTRAVENOUS
  Filled 2022-06-22: qty 1

## 2022-06-22 MED ORDER — LACTATED RINGERS IV SOLN: INTRAVENOUS | Status: DC

## 2022-06-22 MED ORDER — PANTOPRAZOLE SODIUM 40 MG IV SOLR: 40.0000 mg | INTRAVENOUS | Status: DC

## 2022-06-22 MED ORDER — HYDROMORPHONE HCL 1 MG/ML IJ SOLN: 0.5000 mg | INTRAMUSCULAR | Status: DC | PRN

## 2022-06-22 MED ORDER — PANTOPRAZOLE SODIUM 40 MG PO TBEC
40.0000 mg | DELAYED_RELEASE_TABLET | Freq: Two times a day (BID) | ORAL | Status: DC
  Administered 2022-06-23 – 2022-06-25 (×5): 40 mg via ORAL
  Filled 2022-06-22 (×5): qty 1

## 2022-06-22 MED ORDER — SODIUM CHLORIDE 0.9 % IV BOLUS
1000.0000 mL | Freq: Once | INTRAVENOUS | Status: AC
  Administered 2022-06-22: 1000 mL via INTRAVENOUS

## 2022-06-22 NOTE — Hospital Course (Addendum)
PMH of HLD, recurrent pancreatitis, chronic abdominal pain, depression, anxiety, bipolar disorder, B12 deficiency, COPD, active smoker presented to hospital with complaints of recurrence of abdominal pain.  Lipase was elevated therefore suspected to have mild pancreatitis.  Also had intractable nausea and vomiting. Hospitalized between 10/12 - 10/19 for similar presentation underwent EGD which showed esophagitis and gastritis followed by lap chole for chronic cholecystitis.

## 2022-06-22 NOTE — Progress Notes (Signed)
Mobility Specialist - Progress Note   06/22/22 1400  Mobility  Activity Ambulated with assistance in room  Level of Assistance Contact guard assist, steadying assist  Assistive Device Front wheel walker  Distance Ambulated (ft) 20 ft  Range of Motion/Exercises Active  Activity Response Tolerated well  Mobility Referral Yes  $Mobility charge 1 Mobility   Pt was found in bed and agreeable to ambulate. Stated feeling a little lightheaded when getting up but that it was not "too bad". At EOS returned to bed with necessities in reach.  Billey Chang Mobility Specialist

## 2022-06-22 NOTE — Progress Notes (Signed)
[facility] Progress Note Patient: Stephanie Benton [id number]:[id number] DOB: Apr 22, 1951 DOA: 06/21/2022  DOS: the patient was seen and examined on 06/22/2022  Brief hospital course: PMH of HLD, recurrent pancreatitis, chronic abdominal pain, depression, anxiety, bipolar disorder, B12 deficiency, COPD, active smoker presented to hospital with complaints of recurrence of abdominal pain.  Lipase was elevated therefore suspected to have mild pancreatitis.  Also had intractable nausea and vomiting. Hospitalized between 10/12 - 10/19 for similar presentation underwent EGD which showed esophagitis and gastritis followed by lap chole for chronic cholecystitis. Assessment and Plan: Acute on chronic pancreatitis. CT abdomen shows evidence of possible mild pancreatitis. Lipase level mildly elevated. Remains n.p.o. for now. Treated with IV hydration and IV antiemetics as well as pain medication.  Chronic abdominal pain. Fat necrosis Hiatal hernia Recent lap chole. Patient reports acute worsening of her chronic abdominal pain. This could be secondary to chronic pancreatitis although possibility of fat necrosis causing pain cannot be ruled out. Patient is not able to tolerate 1 mg of Dilaudid with resultant hypotension. I will reduce the dose of the Dilaudid and Toradol and other regimen to control her pain. Monitor.  Hypertension. Hypotension. Blood pressure soft. Received IV fluid bolus. Holding antihypertensive regimen. Continue with IV fluid.  Gastritis/esophagitis. Currently on IV Toradol.  Will increase IV Protonix to twice daily. Monitor.  Depression. Patient is on Zoloft, venlafaxine, Risperdal. Currently recommend to transition from Zoloft to Cymbalta for pain control. Continue Lyrica as well.  COPD. Cigarette smoker. Continue inhalers. Nicotine patch.  HLD. On simvastatin currently on hold.  Hypokalemia. Hyponatremia. Currently being replaced. Monitor.  AKI. From poor  p.o. intake. Currently receiving IV hydration. Monitor.  Normocytic anemia. Dilutional in nature. Monitor.  QT prolongation. From hypokalemia. Monitor.   Subjective: Had 2 episodes of vomiting in last 24 hours since arrival to the hospital.  Currently has nausea.  Also reports some dizziness.  No chest pain.  No change in her abdominal pain.  Physical Exam: General: in moderate distress;  Cardiovascular: S1 and S2 Present, no Murmur Respiratory: normal respiratory effort, Bilateral Air entry present, no Crackles, no wheezes Abdomen: Bowel Sound present, diffusely tender Extremities: no edema Neurology: alert and oriented to time, place, and person   Data Reviewed: I have Reviewed nursing notes, Vitals, and Lab results. Since last encounter, pertinent lab results CBC and BMP   . I have ordered test including CBC CMP lipase level  .   Disposition: Status is: Observation  SCDs Start: 06/21/22 1753   Family Communication: No one at bedside Level of care: Telemetry continue telemetry due to hypotension episodes.  Vitals:   06/22/22 0131 06/22/22 0440 06/22/22 0900 06/22/22 1234  BP: 111/60 119/73 90/60 109/60  Pulse: 80 81  68  Resp: 16 16  16   Temp: 97.6 F (36.4 C) 97.7 F (36.5 C)  97.6 F (36.4 C)  TempSrc: Oral Oral  Oral  SpO2: 100% 100%  99%     Author: , MD 06/22/2022 4:46 PM  Please look on [email or web address] to find out who is on call.

## 2022-06-23 DIAGNOSIS — E871 Hypo-osmolality and hyponatremia: Secondary | ICD-10-CM | POA: Diagnosis present

## 2022-06-23 DIAGNOSIS — D649 Anemia, unspecified: Secondary | ICD-10-CM | POA: Diagnosis present

## 2022-06-23 DIAGNOSIS — E876 Hypokalemia: Secondary | ICD-10-CM | POA: Diagnosis present

## 2022-06-23 DIAGNOSIS — F17210 Nicotine dependence, cigarettes, uncomplicated: Secondary | ICD-10-CM | POA: Diagnosis present

## 2022-06-23 DIAGNOSIS — I959 Hypotension, unspecified: Secondary | ICD-10-CM | POA: Diagnosis present

## 2022-06-23 DIAGNOSIS — G8929 Other chronic pain: Secondary | ICD-10-CM | POA: Diagnosis present

## 2022-06-23 DIAGNOSIS — K859 Acute pancreatitis without necrosis or infection, unspecified: Secondary | ICD-10-CM | POA: Diagnosis present

## 2022-06-23 DIAGNOSIS — K209 Esophagitis, unspecified without bleeding: Secondary | ICD-10-CM | POA: Diagnosis present

## 2022-06-23 DIAGNOSIS — F319 Bipolar disorder, unspecified: Secondary | ICD-10-CM | POA: Diagnosis present

## 2022-06-23 DIAGNOSIS — I10 Essential (primary) hypertension: Secondary | ICD-10-CM | POA: Diagnosis present

## 2022-06-23 DIAGNOSIS — E669 Obesity, unspecified: Secondary | ICD-10-CM | POA: Diagnosis present

## 2022-06-23 DIAGNOSIS — K449 Diaphragmatic hernia without obstruction or gangrene: Secondary | ICD-10-CM | POA: Diagnosis present

## 2022-06-23 DIAGNOSIS — N179 Acute kidney failure, unspecified: Secondary | ICD-10-CM | POA: Diagnosis present

## 2022-06-23 DIAGNOSIS — K861 Other chronic pancreatitis: Secondary | ICD-10-CM | POA: Diagnosis present

## 2022-06-23 DIAGNOSIS — J449 Chronic obstructive pulmonary disease, unspecified: Secondary | ICD-10-CM | POA: Diagnosis present

## 2022-06-23 DIAGNOSIS — H409 Unspecified glaucoma: Secondary | ICD-10-CM | POA: Diagnosis present

## 2022-06-23 DIAGNOSIS — E538 Deficiency of other specified B group vitamins: Secondary | ICD-10-CM | POA: Diagnosis present

## 2022-06-23 DIAGNOSIS — E7800 Pure hypercholesterolemia, unspecified: Secondary | ICD-10-CM | POA: Diagnosis present

## 2022-06-23 DIAGNOSIS — Z809 Family history of malignant neoplasm, unspecified: Secondary | ICD-10-CM | POA: Diagnosis not present

## 2022-06-23 DIAGNOSIS — Z79899 Other long term (current) drug therapy: Secondary | ICD-10-CM | POA: Diagnosis not present

## 2022-06-23 DIAGNOSIS — T501X5A Adverse effect of loop [high-ceiling] diuretics, initial encounter: Secondary | ICD-10-CM | POA: Diagnosis present

## 2022-06-23 DIAGNOSIS — F419 Anxiety disorder, unspecified: Secondary | ICD-10-CM | POA: Diagnosis present

## 2022-06-23 DIAGNOSIS — Z6830 Body mass index (BMI) 30.0-30.9, adult: Secondary | ICD-10-CM | POA: Diagnosis not present

## 2022-06-23 DIAGNOSIS — K2970 Gastritis, unspecified, without bleeding: Secondary | ICD-10-CM | POA: Diagnosis present

## 2022-06-23 DIAGNOSIS — R1013 Epigastric pain: Secondary | ICD-10-CM | POA: Diagnosis present

## 2022-06-23 LAB — COMPREHENSIVE METABOLIC PANEL
ALT: 7 U/L (ref 0–44)
AST: 12 U/L — ABNORMAL LOW (ref 15–41)
Albumin: 2.5 g/dL — ABNORMAL LOW (ref 3.5–5.0)
Alkaline Phosphatase: 70 U/L (ref 38–126)
Anion gap: 8 (ref 5–15)
BUN: 6 mg/dL — ABNORMAL LOW (ref 8–23)
CO2: 23 mmol/L (ref 22–32)
Calcium: 8 mg/dL — ABNORMAL LOW (ref 8.9–10.3)
Chloride: 104 mmol/L (ref 98–111)
Creatinine, Ser: 0.83 mg/dL (ref 0.44–1.00)
GFR, Estimated: >60 mL/min (ref >60)
Glucose, Bld: 79 mg/dL (ref 70–99)
Potassium: 3.5 mmol/L (ref 3.5–5.1)
Sodium: 135 mmol/L (ref 135–145)
Total Bilirubin: 1.2 mg/dL (ref 0.3–1.2)
Total Protein: 4.9 g/dL — ABNORMAL LOW (ref 6.5–8.1)

## 2022-06-23 LAB — MAGNESIUM: Magnesium: 1.9 mg/dL (ref 1.7–2.4)

## 2022-06-23 LAB — CBC
HCT: 28 % — ABNORMAL LOW (ref 36.0–46.0)
Hemoglobin: 9.2 g/dL — ABNORMAL LOW (ref 12.0–15.0)
MCH: 26.9 pg (ref 26.0–34.0)
MCHC: 32.9 g/dL (ref 30.0–36.0)
MCV: 81.9 fL (ref 80.0–100.0)
Platelets: 231 10*3/uL (ref 150–400)
RBC: 3.42 MIL/uL — ABNORMAL LOW (ref 3.87–5.11)
RDW: 15.8 % — ABNORMAL HIGH (ref 11.5–15.5)
WBC: 4.8 10*3/uL (ref 4.0–10.5)
nRBC: 0 % (ref 0.0–0.2)

## 2022-06-23 LAB — LIPASE, BLOOD: Lipase: 148 U/L — ABNORMAL HIGH (ref 11–51)

## 2022-06-23 MED ORDER — PREGABALIN 50 MG PO CAPS
100.0000 mg | ORAL_CAPSULE | Freq: Three times a day (TID) | ORAL | Status: DC
  Administered 2022-06-23 – 2022-06-25 (×6): 100 mg via ORAL
  Filled 2022-06-23 (×6): qty 2

## 2022-06-23 MED ORDER — DULOXETINE HCL 30 MG PO CPEP
30.0000 mg | ORAL_CAPSULE | Freq: Every day | ORAL | Status: AC
  Administered 2022-06-24: 30 mg via ORAL
  Filled 2022-06-23: qty 1

## 2022-06-23 MED ORDER — DULOXETINE HCL 20 MG PO CPEP
20.0000 mg | ORAL_CAPSULE | Freq: Every day | ORAL | Status: AC
  Administered 2022-06-23: 20 mg via ORAL
  Filled 2022-06-23: qty 1

## 2022-06-23 NOTE — Plan of Care (Signed)

## 2022-06-23 NOTE — Progress Notes (Signed)
[facility] Progress Note Patient: Stephanie Benton [id number]:[id number] DOB: 08/19/1950 DOA: 06/21/2022  DOS: the patient was seen and examined on 06/23/2022  Brief hospital course: PMH of HLD, recurrent pancreatitis, chronic abdominal pain, depression, anxiety, bipolar disorder, B12 deficiency, COPD, active smoker presented to hospital with complaints of recurrence of abdominal pain.  Lipase was elevated therefore suspected to have mild pancreatitis.  Also had intractable nausea and vomiting. Hospitalized between 10/12 - 10/19 for similar presentation underwent EGD which showed esophagitis and gastritis followed by lap chole for chronic cholecystitis. Assessment and Plan: Acute on chronic pancreatitis. CT abdomen shows evidence of possible mild pancreatitis. Lipase level mildly elevated. No for her vomiting.  We will gradually advance to clear liquid diet to soft diet tomorrow. Continue with IV hydration and IV antiemetics as well as pain medication.  Chronic abdominal pain. Fat necrosis Hiatal hernia Recent lap chole. Patient reports acute worsening of her chronic abdominal pain. This could be secondary to chronic pancreatitis although possibility of fat necrosis causing pain cannot be ruled out. Continue Dilaudid as needed.  Discontinue Toradol.  Switching from venlafaxine to Cymbalta. Increase Lyrica to 3 times daily. The pain does not improve tomorrow we will consult general surgery.  Hypertension. Hypotension. Blood pressure soft. Received IV fluid bolus. Holding antihypertensive regimen. Continue with IV fluid.  Gastritis/esophagitis. Continue PPI twice daily.  Depression. Patient is on Zoloft, venlafaxine, Risperdal. Currently recommend to transition from Zoloft to Cymbalta for pain control. Continue Lyrica as well.  Increase frequency to 3 times daily.  COPD. Cigarette smoker. Continue inhalers. Nicotine patch.  HLD. On simvastatin currently on  hold.  Hypokalemia. Hyponatremia. Currently being replaced. Monitor.  AKI. From poor p.o. intake. Currently receiving IV hydration. Monitor.  Normocytic anemia. Dilutional in nature. Monitor.  QT prolongation. From hypokalemia. Monitor.  Obesity Body mass index is 30.12 kg/m.  Placing the pt at higher risk of poor outcomes.   Subjective: Pain still.  No nausea or vomiting.  No fever no chills.  Passing gas but no BM.  Physical Exam: In mild distress. S1-S2 present. Bowel sound present.  Diffusely tender abdomen. Clear to auscultation. No edema at the time of my evaluation.  Data Reviewed: I have Reviewed nursing notes, Vitals, and Lab results. Since last encounter, pertinent lab results CBC and BMP and lipase   . I have ordered test including CBC and BMP and lipase  .   Disposition: Status inpatient.  Continue IV fluids.  SCDs Start: 06/21/22 1753   Family Communication: No one at bedside Level of care: Med-Surg switch to MedSurg.  Vitals:   06/22/22 2030 06/23/22 0433 06/23/22 1310 06/23/22 1806  BP: 124/70 (!) 97/57 119/65   Pulse: 76 79 73   Resp: 19 16 20    Temp: (!) 97.5 F (36.4 C) 97.8 F (36.6 C) 97.9 F (36.6 C)   TempSrc: Oral Oral Oral   SpO2: 100% 99% 100% 98%  Weight:      Height:         Author: , MD 06/23/2022 8:02 PM  Please look on [email or web address] to find out who is on call.

## 2022-06-24 DIAGNOSIS — E876 Hypokalemia: Secondary | ICD-10-CM | POA: Diagnosis not present

## 2022-06-24 LAB — COMPREHENSIVE METABOLIC PANEL
ALT: 7 U/L (ref 0–44)
AST: 14 U/L — ABNORMAL LOW (ref 15–41)
Albumin: 2.8 g/dL — ABNORMAL LOW (ref 3.5–5.0)
Alkaline Phosphatase: 79 U/L (ref 38–126)
Anion gap: 8 (ref 5–15)
BUN: <5 mg/dL — ABNORMAL LOW (ref 8–23)
CO2: 24 mmol/L (ref 22–32)
Calcium: 9 mg/dL (ref 8.9–10.3)
Chloride: 106 mmol/L (ref 98–111)
Creatinine, Ser: 0.77 mg/dL (ref 0.44–1.00)
GFR, Estimated: >60 mL/min (ref >60)
Glucose, Bld: 81 mg/dL (ref 70–99)
Potassium: 3.3 mmol/L — ABNORMAL LOW (ref 3.5–5.1)
Sodium: 138 mmol/L (ref 135–145)
Total Bilirubin: 0.9 mg/dL (ref 0.3–1.2)
Total Protein: 5.5 g/dL — ABNORMAL LOW (ref 6.5–8.1)

## 2022-06-24 LAB — CBC
HCT: 32.3 % — ABNORMAL LOW (ref 36.0–46.0)
Hemoglobin: 10.2 g/dL — ABNORMAL LOW (ref 12.0–15.0)
MCH: 26.4 pg (ref 26.0–34.0)
MCHC: 31.6 g/dL (ref 30.0–36.0)
MCV: 83.5 fL (ref 80.0–100.0)
Platelets: 267 10*3/uL (ref 150–400)
RBC: 3.87 MIL/uL (ref 3.87–5.11)
RDW: 16.2 % — ABNORMAL HIGH (ref 11.5–15.5)
WBC: 4.8 10*3/uL (ref 4.0–10.5)
nRBC: 0 % (ref 0.0–0.2)

## 2022-06-24 LAB — MAGNESIUM: Magnesium: 1.9 mg/dL (ref 1.7–2.4)

## 2022-06-24 MED ORDER — POTASSIUM CHLORIDE CRYS ER 20 MEQ PO TBCR
40.0000 meq | EXTENDED_RELEASE_TABLET | ORAL | Status: AC
  Administered 2022-06-24 (×2): 40 meq via ORAL
  Filled 2022-06-24 (×2): qty 2

## 2022-06-24 MED ORDER — ENSURE ENLIVE PO LIQD
237.0000 mL | Freq: Two times a day (BID) | ORAL | Status: DC
  Administered 2022-06-24: 237 mL via ORAL

## 2022-06-24 NOTE — Progress Notes (Signed)
[facility] Progress Note Patient: Stephanie Benton [id number] DOB: [birth year] DOA: 06/21/2022  DOS: the patient was seen and examined on 06/24/2022  Brief hospital course: PMH of HLD, recurrent pancreatitis, chronic abdominal pain, depression, anxiety, bipolar disorder, B12 deficiency, COPD, active smoker presented to hospital with complaints of recurrence of abdominal pain.  Lipase was elevated therefore suspected to have mild pancreatitis.  Also had intractable nausea and vomiting. Hospitalized between 10/12 - 10/19 for similar presentation underwent EGD which showed esophagitis and gastritis followed by lap chole for chronic cholecystitis. Assessment and Plan: Acute on chronic pancreatitis. CT abdomen shows evidence of possible mild pancreatitis. Lipase level mildly elevated. So far no vomiting.  Advance to full liquid diet and if tolerated advance to soft diet. We will stop IV fluids. Continue IV antiemetic.  Chronic abdominal pain. Fat necrosis Hiatal hernia Recent lap chole. Patient reports acute worsening of her chronic abdominal pain. This could be secondary to chronic pancreatitis although possibility of fat necrosis causing pain cannot be ruled out. Continue Dilaudid as needed.  Discontinue Toradol.  Switching from venlafaxine to Cymbalta. Increase Lyrica to 3 times daily. Pain improving and under control.  No nausea no vomiting.  Monitor.  Hypertension. Hypotension. Blood pressure was soft.  Now improving and treated with IV fluids. Hold antihypertensive regimen.  Gastritis/esophagitis. Continue PPI twice daily.  Depression. Patient is on Zoloft, venlafaxine, Risperdal. Currently recommend to transition from Zoloft to Cymbalta for pain control. Continue Lyrica as well.  Increase frequency to 3 times daily.  COPD. Cigarette smoker. Continue inhalers. Nicotine patch.  HLD. On simvastatin currently on hold.  Hypokalemia. Hyponatremia. Currently being  replaced. Monitor.  AKI. From poor p.o. intake. Currently receiving IV hydration. Monitor.  Normocytic anemia. Dilutional in nature. Monitor.  QT prolongation. From hypokalemia. Monitor.  Obesity Body mass index is 30.12 kg/m.  Placing the pt at higher risk of poor outcomes.   Subjective: Pain improving.  No nausea or vomiting.  Tolerating clear liquid diet.  Passing gas but no BM.  Physical Exam: In mild distress. S1-S2 present Bowel sound present.  Nondistended.  Tenderness improving. No edema in the lower extremity.  Data Reviewed: I have Reviewed nursing notes, Vitals, and Lab results. Since last encounter, pertinent lab results CBC and BMP   . I have ordered test including CBC and BMP  .    Disposition: Status inpatient.  Continue pain control.  Monitor for diet tolerance.  SCDs Start: 06/21/22 1753   Family Communication: No one at bedside Level of care: Med-Surg  Vitals:   06/23/22 1310 06/23/22 1806 06/24/22 0406 06/24/22 1306  BP: 119/65  118/72 127/66  Pulse: 73  86 85  Resp: 20  18 20   Temp: 97.9 F (36.6 C)  98 F (36.7 C) 98.2 F (36.8 C)  TempSrc: Oral   Oral  SpO2: 100% 98% 100% 100%  Weight:      Height:         Author: , MD 06/24/2022 7:19 PM  Please look on [email or web address] to find out who is on call.

## 2022-06-24 NOTE — TOC Initial Note (Signed)
Transition of Care [facility]) - Initial/Assessment Note    Patient Details  Name: Stephanie Benton MRN: [id number] Date of Birth: 12-08-1950  Transition of Care [facility] CM/SW Contact:    Golda Acre, RN Phone Number: 06/24/2022, 8:56 AM  Clinical Narrative:                 Current smoker. Quitting smoking information added to the AVS.  Expected Discharge Plan: Home/Self Care Barriers to Discharge: Continued Medical Work up   Patient Goals and CMS Choice Patient states their goals for this hospitalization and ongoing recovery are:: to go home CMS Medicare.gov Compare Post Acute Care list provided to:: Patient    Expected Discharge Plan and Services Expected Discharge Plan: Home/Self Care   Discharge Planning Services: CM Consult   Living arrangements for the past 2 months: Apartment                                      Prior Living Arrangements/Services Living arrangements for the past 2 months: Apartment Lives with:: Self Patient language and need for interpreter reviewed:: Yes Do you feel safe going back to the place where you live?: Yes            Criminal Activity/Legal Involvement Pertinent to Current Situation/Hospitalization: No - Comment as needed  Activities of Daily Living Home Assistive Devices/Equipment: Eyeglasses, Cane (specify quad or straight), Walker (specify type), Bedside commode/3-in-1 ADL Screening (condition at time of admission) Patient's cognitive ability adequate to safely complete daily activities?: Yes Is the patient deaf or have difficulty hearing?: No Does the patient have difficulty seeing, even when wearing glasses/contacts?: Yes Does the patient have difficulty concentrating, remembering, or making decisions?: No Patient able to express need for assistance with ADLs?: Yes Does the patient have difficulty dressing or bathing?: No Independently performs ADLs?: Yes (appropriate for developmental age) Does the patient have  difficulty walking or climbing stairs?: Yes Weakness of Legs: Both Weakness of Arms/Hands: Both  Permission Sought/Granted                  Emotional Assessment Appearance:: Appears stated age Attitude/Demeanor/Rapport: Engaged Affect (typically observed): Calm Orientation: : Oriented to Self, Oriented to Place, Oriented to  Time, Oriented to Situation Alcohol / Substance Use: Tobacco Use (current smoker) Psych Involvement: No (comment)  Admission diagnosis:  Hypokalemia [E87.6] Acute on chronic pancreatitis (HCC) [K85.90, K86.1] Patient Active Problem List   Diagnosis Date Noted   Acute on chronic pancreatitis (HCC) 06/23/2022   Hypokalemia 06/21/2022   Prolonged QT interval 06/21/2022   COPD (chronic obstructive pulmonary disease) (HCC) 06/21/2022   Glaucoma 06/21/2022   Abdominal pain, chronic, epigastric    LFTs abnormal    Pancreatitis 05/17/2022   Metabolic acidosis 05/17/2022   Esophagitis determined by endoscopy 05/17/2022   Dehydration 05/16/2022   Sleep disturbance 08/21/2021   Primary osteoarthritis of left knee 05/12/2018   Insomnia due to mental condition 05/08/2018   Chronic bilateral low back pain 03/25/2018   Tobacco abuse counseling 02/18/2018   Hyperkalemia 02/18/2018   Chest pain 02/17/2018   COPD with acute exacerbation (HCC) 02/17/2018   Hypertension 02/17/2018   Depression with anxiety 02/17/2018   AKI (acute kidney injury) (HCC)    Normochromic normocytic anemia    Bipolar disorder, in partial remission, most recent episode mixed (HCC) 07/04/2017   Osteopenia of multiple sites 07/04/2017   Bipolar disorder, in full remission, most  recent episode mixed (HCC) 07/04/2017   Cigarette smoker 06/12/2017   Bilateral hip pain 05/01/2017   B12 deficiency 03/04/2017   Spondylosis of lumbar region without myelopathy or radiculopathy 08/08/2016   Risk for falls 05/06/2016   Hypercholesterolemia 02/21/2016   PCP:  Sharmon Revere, MD Pharmacy:    [id number] [place], [place] - [address] AT [facility] & [address] [place] [address] Phone: [phone] Fax: [phone]  [facility] [place], [place] - [address] [place]  [address] Phone: [phone] Fax: [phone]  [facility] [address] [place] [address] Phone: [phone] Fax: [phone]     Social Determinants of Health (SDOH) Interventions    Readmission Risk Interventions   Row Labels 05/21/2022   10:04 AM  Readmission Risk Prevention Plan   Section Header. No data exists in this row.   Transportation Screening   Complete  Home Care Screening   Complete  Medication Review (RN CM)   Complete

## 2022-06-25 LAB — COMPREHENSIVE METABOLIC PANEL
ALT: 7 U/L (ref 0–44)
AST: 14 U/L — ABNORMAL LOW (ref 15–41)
Albumin: 2.6 g/dL — ABNORMAL LOW (ref 3.5–5.0)
Alkaline Phosphatase: 69 U/L (ref 38–126)
Anion gap: 6 (ref 5–15)
BUN: <5 mg/dL — ABNORMAL LOW (ref 8–23)
CO2: 24 mmol/L (ref 22–32)
Calcium: 9 mg/dL (ref 8.9–10.3)
Chloride: 108 mmol/L (ref 98–111)
Creatinine, Ser: 0.81 mg/dL (ref 0.44–1.00)
GFR, Estimated: >60 mL/min (ref >60)
Glucose, Bld: 80 mg/dL (ref 70–99)
Potassium: 4.1 mmol/L (ref 3.5–5.1)
Sodium: 138 mmol/L (ref 135–145)
Total Bilirubin: 1 mg/dL (ref 0.3–1.2)
Total Protein: 5.1 g/dL — ABNORMAL LOW (ref 6.5–8.1)

## 2022-06-25 LAB — CBC
HCT: 29 % — ABNORMAL LOW (ref 36.0–46.0)
Hemoglobin: 9.2 g/dL — ABNORMAL LOW (ref 12.0–15.0)
MCH: 26.3 pg (ref 26.0–34.0)
MCHC: 31.7 g/dL (ref 30.0–36.0)
MCV: 82.9 fL (ref 80.0–100.0)
Platelets: 236 10*3/uL (ref 150–400)
RBC: 3.5 MIL/uL — ABNORMAL LOW (ref 3.87–5.11)
RDW: 16.3 % — ABNORMAL HIGH (ref 11.5–15.5)
WBC: 4.1 10*3/uL (ref 4.0–10.5)
nRBC: 0 % (ref 0.0–0.2)

## 2022-06-25 LAB — MAGNESIUM: Magnesium: 1.8 mg/dL (ref 1.7–2.4)

## 2022-06-25 MED ORDER — PREGABALIN 100 MG PO CAPS: 100.0000 mg | ORAL_CAPSULE | Freq: Three times a day (TID) | ORAL | Status: DC

## 2022-06-25 MED ORDER — ENSURE ENLIVE PO LIQD: 237.0000 mL | Freq: Two times a day (BID) | ORAL | 0 refills | Status: DC

## 2022-06-25 MED ORDER — DULOXETINE HCL 20 MG PO CPEP
40.0000 mg | ORAL_CAPSULE | Freq: Every day | ORAL | Status: DC
  Administered 2022-06-25: 40 mg via ORAL
  Filled 2022-06-25: qty 2

## 2022-06-25 MED ORDER — DULOXETINE HCL 40 MG PO CPEP: 40.0000 mg | ORAL_CAPSULE | Freq: Every day | ORAL | 0 refills | Status: DC

## 2022-06-25 MED ORDER — SUCRALFATE 1 GM/10ML PO SUSP: 1.0000 g | Freq: Two times a day (BID) | ORAL | 0 refills | Status: DC

## 2022-06-25 MED ORDER — TRAMADOL HCL 50 MG PO TABS: 50.0000 mg | ORAL_TABLET | Freq: Three times a day (TID) | ORAL | 0 refills | Status: DC | PRN

## 2022-06-25 NOTE — Progress Notes (Signed)
Initial Nutrition Assessment  DOCUMENTATION CODES:   Non-severe (moderate) malnutrition in context of chronic illness  INTERVENTION:  - Heart healthy diet. Liberalize to Regular if poor intake.  - Ensure Max po BID, each supplement provides 150 kcal and 30 grams of protein.  - Encourage intake of frequent meals and snacks throughout the day to stimulate appetite. - Daily multivitamin with minerals to support micronutrient needs.  NUTRITION DIAGNOSIS:   Moderate Malnutrition related to chronic illness, poor appetite (recurrent pancreatitis with chronic abdominal pain and COPD) as evidenced by moderate fat depletion, moderate muscle depletion.  GOAL:   Patient will meet greater than or equal to 90% of their needs  MONITOR:   PO intake, Supplement acceptance, Weight trends  REASON FOR ASSESSMENT:   Malnutrition Screening Tool    ASSESSMENT:   71 y.o. female with PMH of HLD, recurrent pancreatitis, chronic abdominal pain, depression, anxiety, bipolar disorder, B12 deficiency, COPD, active smoker presented to hospital with complaints of recurrence of abdominal painand intractable nausea and vomiting.   Patient resting in bed at time of visit. She reports UBW of 202# and weight loss over the past couple months due to being sick and having no appetite. Patient has not had a weight since 2021 to assess recent changes. From 202# to 181# is a 21# or 10% weight loss, which would be significant if within the past 6 months as patient reported. Patient endorses eating "nothing" for several months due to no appetite. States she has been eating a lot of fruit. Has had Ensure and Boost before but does not consume regularly. Pt notes she is feeling better now and not experiencing nausea or abdominal pain. Waiting for her lunch of a salad and tea. Admits lack of appetite has been a problem for her for several years. Inquiring ways to increase appetite so discussed frequent meals and snacks throughout  the day to stimulate gut and appetite. She is agreeable to receive nutrition supplements between meals to support intake.  Medications reviewed and include: Carafate  Labs reviewed:  -    NUTRITION - FOCUSED PHYSICAL EXAM:  Flowsheet Row Most Recent Value  Orbital Region Moderate depletion  Upper Arm Region Moderate depletion  Thoracic and Lumbar Region Mild depletion  Buccal Region Mild depletion  Temple Region Moderate depletion  Clavicle Bone Region Moderate depletion  Clavicle and Acromion Bone Region Moderate depletion  Scapular Bone Region Unable to assess  Dorsal Hand Mild depletion  Patellar Region Mild depletion  Anterior Thigh Region Mild depletion  Posterior Calf Region Mild depletion  Edema (RD Assessment) Mild  Hair Reviewed  Eyes Reviewed  Mouth Reviewed  Skin Reviewed  Nails Reviewed       Diet Order:   Diet Order             Diet Heart Room service appropriate? Yes; Fluid consistency: Thin  Diet effective now                   EDUCATION NEEDS:   No education needs have been identified at this time  Skin:  Skin Assessment: Reviewed RN Assessment  Last BM:  11/18  Height:  Ht Readings from Last 1 Encounters:  06/22/22 5\' 5"  (1.651 m)   Weight:  Wt Readings from Last 1 Encounters:  06/22/22 82.1 kg    BMI:  Body mass index is 30.12 kg/m.  Estimated Nutritional Needs:  Kcal:  2300-2450 kcal Protein:  100-125 grams Fluid:  >/= 2.3L    06/24/22  RD, LDN For contact information, refer to [facility].

## 2022-06-25 NOTE — Discharge Instructions (Signed)
Do not take the 50 mg version of lyrica prescribed by Dr Delia Chimes while you are on 100 mg version of lyrica.

## 2022-06-25 NOTE — TOC Transition Note (Signed)
Transition of Care [facility]) - CM/SW Discharge Note   Patient Details  Name: Stephanie Benton MRN: [id number] Date of Birth: 03/08/1951  Transition of Care [facility]) CM/SW Contact:  Golda Acre, RN Phone Number: 06/25/2022, 1:27 PM   Clinical Narrative:    Pt dcd to return to home with no toc needs.   Final next level of care: Home/Self Care Barriers to Discharge: Barriers Resolved   Patient Goals and CMS Choice Patient states their goals for this hospitalization and ongoing recovery are:: to go home CMS Medicare.gov Compare Post Acute Care list provided to:: Patient    Discharge Placement                       Discharge Plan and Services   Discharge Planning Services: CM Consult                                 Social Determinants of Health (SDOH) Interventions Housing Interventions: Intervention Not Indicated   Readmission Risk Interventions   Row Labels 06/24/2022    9:42 AM 05/21/2022   10:04 AM  Readmission Risk Prevention Plan   Section Header. No data exists in this row.    Transportation Screening   Complete Complete  PCP or Specialist Appt within 3-5 Days   Complete   Home Care Screening    Complete  Medication Review (RN CM)    Complete  HRI or Home Care Consult   Complete   Social Work Consult for Recovery Care Planning/Counseling   Complete   Palliative Care Screening   Not Applicable   Medication Review [employer])   Complete

## 2022-06-29 NOTE — Discharge Summary (Signed)
Physician Discharge Summary   Patient: Stephanie Benton MRN: [id number] DOB: Oct 07, 1950  Admit date:     06/21/2022  Discharge date: 06/25/2022  Discharge Physician: Lynden Oxford  PCP: Sharmon Revere, MD  Recommendations at discharge:  Follow up with PCP as recommended   Follow-up Information     Paliwal, Himanshu, MD. Schedule an appointment as soon as possible for a visit in 1 week(s).   Specialty: Family Medicine Why: discuss lyrica dose Contact information: [address] [place] [address] [phone]         Jeani Hawking, MD. Schedule an appointment as soon as possible for a visit in 1 week(s).   Specialty: Gastroenterology Contact information: [address], [place] [address] [phone]         [facility]. Schedule an appointment as soon as possible for a visit in 1 week(s).   Contact information: [address] [place] [address]               Discharge Diagnoses: Principal Problem:   Hypokalemia Active Problems:   Hypertension   Depression with anxiety   Cigarette smoker   Hypercholesterolemia   Pancreatitis   Prolonged QT interval   COPD (chronic obstructive pulmonary disease) (HCC)   Glaucoma   Acute on chronic pancreatitis [facility])  Hospital Course: PMH of HLD, recurrent pancreatitis, chronic abdominal pain, depression, anxiety, bipolar disorder, B12 deficiency, COPD, active smoker presented to hospital with complaints of recurrence of abdominal pain.  Lipase was elevated therefore suspected to have mild pancreatitis.  Also had intractable nausea and vomiting. Hospitalized between 10/12 - 10/19 for similar presentation underwent EGD which showed esophagitis and gastritis followed by lap chole for chronic cholecystitis. Assessment and Plan  Acute on chronic pancreatitis. CT abdomen shows evidence of possible mild pancreatitis. Lipase level mildly  elevated. So far no vomiting.  tolerated  soft diet.   Chronic abdominal pain. Fat necrosis Hiatal hernia Recent lap chole. Patient reports acute worsening of her chronic abdominal pain. This could be secondary to chronic pancreatitis although possibility of fat necrosis causing pain cannot be ruled out. Continue Dilaudid as needed.  Discontinue Toradol.  Switching from venlafaxine to Cymbalta. Lyrica to 3 times daily. Pain improving and under control.  No nausea no vomiting.  Monitor.   Hypertension. Hypotension. Blood pressure was soft. treated with IV fluids.   Gastritis/esophagitis. Continue PPI twice daily.   Depression. Patient is on Zoloft, venlafaxine, Risperdal. Currently recommend to transition from Zoloft to Cymbalta for pain control. Continue Lyrica as well.  maintain frequency to 3 times daily. Pt was on lyrica 100 tid, per PDMP PCP has prescribed 50 mg TID dose as well. Pt was taking 100 bid per medrec.  I have discussed extensively and recommended the pt to keep taking 100 mg TID and not taking the 50 mg tid dose,.    COPD. Cigarette smoker. Continue inhalers. Nicotine patch.   HLD. On simvastatin currently on hold.   Hypokalemia. Hyponatremia. replaced.   AKI. From poor p.o. intake. Treated IV hydration.   Normocytic anemia. Dilutional in nature.   QT prolongation.  From hypokalemia. Resolved    Obesity Body mass index is 30.12 kg/m.  Placing the pt at higher risk of poor outcomes.   Pain control - [employer] Controlled Substance Reporting System database was reviewed. and patient was instructed, not to drive, operate heavy machinery, perform activities at heights, swimming or participation in water activities or provide baby-sitting  services while on Pain, Sleep and Anxiety Medications; until their outpatient Physician has advised to do so again. Also recommended to not to take more than prescribed Pain, Sleep and Anxiety Medications.   Consultants:  none  Procedures performed:  none  DISCHARGE MEDICATION: Allergies as of 06/25/2022       Reactions   Fish-derived Products Anaphylaxis   Morphine And Related Anaphylaxis, Swelling   Belsomra [suvorexant] Other (See Comments)   Caused nightmares   Codeine Nausea And Vomiting   Fentanyl Hives   Ibuprofen Nausea Only, Other (See Comments)   Severe nausea- history of stomach ulcers   Nsaids Other (See Comments)   History of stomach ulcers   Other Other (See Comments)   History of stomach ulcers   Penicillins Hives   Did it involve swelling of the face/tongue/throat, SOB, or low BP? Yes Did it involve sudden or severe rash/hives, skin peeling, or any reaction on the inside of your mouth or nose? Yes Did you need to seek medical attention at a hospital or doctor's office? Yes When did it last happen? More than 10 years ago If all above answers are "NO", may proceed with cephalosporin use.   Sulfa Antibiotics Swelling   Site of swelling not recalled   Tolmetin Other (See Comments)   History of stomach ulcers        Medication List     STOP taking these medications    pantoprazole 40 MG tablet Commonly known as: PROTONIX   venlafaxine XR 37.5 MG 24 hr capsule Commonly known as: EFFEXOR-XR       TAKE these medications    Ventolin HFA 108 (90 Base) MCG/ACT inhaler Generic drug: albuterol Inhale 2 puffs into the lungs every 6 (six) hours as needed for wheezing or shortness of breath.   albuterol (2.5 MG/3ML) 0.083% nebulizer solution Commonly known as: PROVENTIL Take 3 mLs (2.5 mg total) by nebulization every 6 (six) hours as needed for wheezing or shortness of breath.   dexlansoprazole 60 MG capsule Commonly known as: DEXILANT Take 1 capsule by mouth daily.   diclofenac sodium 1 % Gel Commonly known as: VOLTAREN Apply 2 g topically daily as needed (for knee pain).   dorzolamide 2 % ophthalmic solution Commonly known as: TRUSOPT Place 1 drop  into both eyes 2 (two) times daily.   DULoxetine HCl 40 MG Cpep Take 40 mg by mouth daily.   feeding supplement Liqd Take 237 mLs by mouth 2 (two) times daily between meals.   FeroSul 325 (65 FE) MG tablet Generic drug: ferrous sulfate Take 325 mg by mouth daily with breakfast.   latanoprost 0.005 % ophthalmic solution Commonly known as: XALATAN Place 1 drop into both eyes at bedtime.   nicotine 21 mg/24hr patch Commonly known as: NICODERM CQ - dosed in mg/24 hours Place 1 patch (21 mg total) onto the skin daily.   ondansetron 4 MG disintegrating tablet Commonly known as: ZOFRAN-ODT Take 4 mg by mouth every 8 (eight) hours as needed for nausea or vomiting.   polyethylene glycol powder 17 GM/SCOOP powder Commonly known as: GLYCOLAX/MIRALAX Take 1 cap full (17 g) with water by mouth daily. What changed:  when to take this reasons to take this   pregabalin 100 MG capsule Commonly known as: LYRICA Take 1 capsule (100 mg total) by mouth 3 (three) times daily. What changed: when to take this   risperiDONE 1 MG tablet Commonly known as: RISPERDAL Take 1 mg by mouth at bedtime.  sertraline 100 MG tablet Commonly known as: ZOLOFT Take 100 mg by mouth at bedtime.   simvastatin 40 MG tablet Commonly known as: ZOCOR Take 40 mg by mouth at bedtime.   sucralfate 1 GM/10ML suspension Commonly known as: Carafate Take 10 mLs (1 g total) by mouth 2 (two) times daily.   Symbicort 160-4.5 MCG/ACT inhaler Generic drug: budesonide-formoterol Inhale 1 puff into the lungs 2 (two) times daily as needed (for flares).   telmisartan 80 MG tablet Commonly known as: MICARDIS Take 80 mg by mouth at bedtime.   tolterodine 2 MG 24 hr capsule Commonly known as: DETROL LA Take 2 mg by mouth daily.   torsemide 10 MG tablet Commonly known as: DEMADEX Take 10 mg by mouth daily as needed (for swelling in legs).   traMADol 50 MG tablet Commonly known as: ULTRAM Take 1 tablet (50 mg  total) by mouth every 8 (eight) hours as needed for severe pain or moderate pain. What changed:  when to take this reasons to take this       Disposition: Home Diet recommendation: Regular diet  Discharge Exam: Vitals:   06/24/22 2042 06/25/22 0422 06/25/22 0819 06/25/22 1317  BP: (!) 105/49 97/64  131/78  Pulse: 78 83  84  Resp: 17 16  16   Temp: 97.7 F (36.5 C) 98 F (36.7 C)  97.7 F (36.5 C)  TempSrc: Oral Oral  Oral  SpO2: 100% 100% 95% 100%  Weight:      Height:       General: Appear in no distress; no visible Abnormal Neck Mass Or lumps, Conjunctiva normal Cardiovascular: S1 and S2 Present, no Murmur, Respiratory: good respiratory effort, Bilateral Air entry present and CTA, no Crackles, no wheezes Abdomen: Bowel Sound present,  Extremities: no Pedal edema Neurology: alert and oriented to time, place, and person  [facility] Weights   06/22/22 1958  Weight: 82.1 kg   Condition at discharge: stable  The results of significant diagnostics from this hospitalization (including imaging, microbiology, ancillary and laboratory) are listed below for reference.   Imaging Studies: 06/24/22 Abdomen Limited RUQ (LIVER/GB)  Result Date: 06/21/2022 CLINICAL DATA:  Hyperbilirubinemia. EXAM: ULTRASOUND ABDOMEN LIMITED RIGHT UPPER QUADRANT COMPARISON:  CT abdomen and pelvis 06/11/2022 FINDINGS: Gallbladder: Surgically absent. Common bile duct: Diameter: 5 mm Liver: No focal lesion identified. Suboptimal evaluation secondary to overlying gas. Within normal limits in parenchymal echogenicity. Portal vein is patent on color Doppler imaging with normal direction of blood flow towards the liver. Other: Right pleural effusion noted. IMPRESSION: 1. No acute abnormality. 2. Right pleural effusion. Electronically Signed   By: 13/02/2022 M.D.   On: 06/21/2022 20:08   CT ABDOMEN PELVIS W CONTRAST  Result Date: 06/21/2022 CLINICAL DATA:  Abdominal pain, acute, nonlocalized EXAM: CT ABDOMEN AND  PELVIS WITH CONTRAST TECHNIQUE: Multidetector CT imaging of the abdomen and pelvis was performed using the standard protocol following bolus administration of intravenous contrast. RADIATION DOSE REDUCTION: This exam was performed according to the departmental dose-optimization program which includes automated exposure control, adjustment of the mA and/or kV according to patient size and/or use of iterative reconstruction technique. CONTRAST:  06/23/2022 OMNIPAQUE IOHEXOL 300 MG/ML  SOLN COMPARISON:  CT dated May 09, 2022 FINDINGS: Lower chest: No acute abnormality. Hepatobiliary: Status post cholecystectomy presumed postsurgical changes along the inferior gallbladder fossa. No new findings within the liver. Subcentimeter hypodense lesions are too small to accurately characterize. Pancreas: Unremarkable. No pancreatic ductal dilatation or surrounding inflammatory changes. Spleen: Normal in size  without focal abnormality. Adrenals/Urinary Tract: Adrenal glands are unremarkable. No hydronephrosis. Kidneys enhance symmetrically. No obstructing nephrolithiasis. Bladder is unremarkable. Stomach/Bowel: No evidence of bowel obstruction. Large hiatal hernia. Status post appendectomy. Vascular/Lymphatic: Atherosclerotic calcifications of the nonaneurysmal aorta. No new lymphadenopathy. Reproductive: Status post hysterectomy. No adnexal masses. Other: Revisualization of a fluid collection along the posterior abdominal muscular sure in the mid abdomen. This measures 6.5 x 4.9 cm, unchanged in comparison to prior and likely reflects a chronic seroma. Fat necrosis of the anterior wall soft tissues. Status post midline laparotomy. No free air or free fluid. Musculoskeletal: Revisualization of partial lumbarization of S1. Similar degree of grade 1 anterolisthesis at L5-S1, likely due to facet degenerative changes. Remote rib fractures. IMPRESSION: 1. No suspicious CT etiology for acute abdominal pain is identified. 2. Large hiatal  hernia. Aortic Atherosclerosis (ICD10-I70.0). Electronically Signed   By: Meda Klinefelter M.D.   On: 06/21/2022 15:16   DG Chest Port 1 View  Result Date: 06/21/2022 CLINICAL DATA:  Weakness.  Epigastric pain. EXAM: PORTABLE CHEST 1 VIEW COMPARISON:  One-view chest x-ray 05/16/2022 FINDINGS: Heart size is normal. Atherosclerotic changes are present at the aortic arch. A large hiatal hernia is again noted. No edema or effusion is present. No focal airspace disease is present. Degenerative changes are present at the right shoulder. IMPRESSION: 1. No acute cardiopulmonary disease. 2. Stable large hiatal hernia. Electronically Signed   By: Marin Roberts M.D.   On: 06/21/2022 12:58    Microbiology: Results for orders placed or performed during the hospital encounter of 05/16/22  SARS Coronavirus 2 by RT PCR (hospital order, performed in [facility] hospital lab) *cepheid single result test* Anterior Nasal Swab     Status: None   Collection Time: 05/17/22  5:00 AM   Specimen: Anterior Nasal Swab  Result Value Ref Range Status   SARS Coronavirus 2 by RT PCR NEGATIVE NEGATIVE Final    Comment: (NOTE) SARS-CoV-2 target nucleic acids are NOT DETECTED.  The SARS-CoV-2 RNA is generally detectable in upper and lower respiratory specimens during the acute phase of infection. The lowest concentration of SARS-CoV-2 viral copies this assay can detect is 250 copies / mL. A negative result does not preclude SARS-CoV-2 infection and should not be used as the sole basis for treatment or other patient management decisions.  A negative result may occur with improper specimen collection / handling, submission of specimen other than nasopharyngeal swab, presence of viral mutation(s) within the areas targeted by this assay, and inadequate number of viral copies (<250 copies / mL). A negative result must be combined with clinical observations, patient history, and epidemiological information.  Fact Sheet  for Patients:   RoadLapTop.co.za  Fact Sheet for Healthcare Providers: http://kim-miller.com/  This test is not yet approved or  cleared by the [place] FDA and has been authorized for detection and/or diagnosis of SARS-CoV-2 by FDA under an Emergency Use Authorization (EUA).  This EUA will remain in effect (meaning this test can be used) for the duration of the COVID-19 declaration under Section 564(b)(1) of the Act, 21 U.S.C. section 360bbb-3(b)(1), unless the authorization is terminated or revoked sooner.  Performed at [facility], [address] [place], [place] [address]   Surgical PCR screen     Status: None   Collection Time: 05/21/22 11:20 PM   Specimen: Nasal Mucosa; Nasal Swab  Result Value Ref Range Status   MRSA, PCR NEGATIVE NEGATIVE Final   Staphylococcus aureus NEGATIVE NEGATIVE Final  Comment: (NOTE) The Xpert SA Assay (FDA approved for NASAL specimens in patients 48 years of age and older), is one component of a comprehensive surveillance program. It is not intended to diagnose infection nor to guide or monitor treatment. Performed at [facility], [address], [place], [place] [address]    Labs: CBC: Recent Labs  Lab 06/23/22 0327 06/24/22 0422 06/25/22 0347  WBC 4.8 4.8 4.1  HGB 9.2* 10.2* 9.2*  HCT 28.0* 32.3* 29.0*  MCV 81.9 83.5 82.9  PLT 231 267 236   Basic Metabolic Panel: Recent Labs  Lab 06/23/22 0327 06/24/22 0422 06/25/22 0347  NA 135 138 138  K 3.5 3.3* 4.1  CL 104 106 108  CO2 23 24 24   GLUCOSE 79 81 80  BUN 6* <5* <5*  CREATININE 0.83 0.77 0.81  CALCIUM 8.0* 9.0 9.0  MG 1.9 1.9 1.8   Liver Function Tests: Recent Labs  Lab 06/23/22 0327 06/24/22 0422 06/25/22 0347  AST 12* 14* 14*  ALT 7 7 7   ALKPHOS 70 79 69  BILITOT 1.2 0.9 1.0  PROT 4.9* 5.5* 5.1*  ALBUMIN 2.5* 2.8* 2.6*   CBG: No results for input(s): "GLUCAP" in the last 168  hours.  Discharge time spent: greater than 30 minutes.  Signed: 06/27/22, MD [facility] 06/25/2022

## 2022-06-30 ENCOUNTER — Emergency Department (HOSPITAL_COMMUNITY): Payer: Medicare Other

## 2022-06-30 ENCOUNTER — Encounter (HOSPITAL_COMMUNITY): Payer: Self-pay

## 2022-06-30 ENCOUNTER — Emergency Department (HOSPITAL_COMMUNITY)
Admission: EM | Admit: 2022-06-30 | Discharge: 2022-06-30 | Disposition: A | Discharge status: Home / Self Care | Payer: Medicare Other | Attending: Emergency Medicine | Admitting: Emergency Medicine

## 2022-06-30 ENCOUNTER — Other Ambulatory Visit: Payer: Self-pay

## 2022-06-30 DIAGNOSIS — R112 Nausea with vomiting, unspecified: Secondary | ICD-10-CM | POA: Diagnosis present

## 2022-06-30 DIAGNOSIS — J449 Chronic obstructive pulmonary disease, unspecified: Secondary | ICD-10-CM | POA: Diagnosis not present

## 2022-06-30 DIAGNOSIS — R0602 Shortness of breath: Secondary | ICD-10-CM | POA: Insufficient documentation

## 2022-06-30 DIAGNOSIS — Z7951 Long term (current) use of inhaled steroids: Secondary | ICD-10-CM | POA: Insufficient documentation

## 2022-06-30 DIAGNOSIS — R1084 Generalized abdominal pain: Secondary | ICD-10-CM | POA: Insufficient documentation

## 2022-06-30 DIAGNOSIS — E162 Hypoglycemia, unspecified: Secondary | ICD-10-CM | POA: Diagnosis not present

## 2022-06-30 DIAGNOSIS — R110 Nausea: Secondary | ICD-10-CM

## 2022-06-30 DIAGNOSIS — R079 Chest pain, unspecified: Secondary | ICD-10-CM | POA: Insufficient documentation

## 2022-06-30 DIAGNOSIS — E876 Hypokalemia: Secondary | ICD-10-CM | POA: Insufficient documentation

## 2022-06-30 LAB — URINALYSIS, ROUTINE W REFLEX MICROSCOPIC
Bilirubin Urine: NEGATIVE
Glucose, UA: 50 mg/dL — AB
Hgb urine dipstick: NEGATIVE
Ketones, ur: 20 mg/dL — AB
Leukocytes,Ua: NEGATIVE
Nitrite: NEGATIVE
Protein, ur: NEGATIVE mg/dL
Specific Gravity, Urine: 1.034 — ABNORMAL HIGH (ref 1.005–1.030)
pH: 6 (ref 5.0–8.0)

## 2022-06-30 LAB — CBC
HCT: 31.3 % — ABNORMAL LOW (ref 36.0–46.0)
Hemoglobin: 10.3 g/dL — ABNORMAL LOW (ref 12.0–15.0)
MCH: 27.2 pg (ref 26.0–34.0)
MCHC: 32.9 g/dL (ref 30.0–36.0)
MCV: 82.8 fL (ref 80.0–100.0)
Platelets: 232 10*3/uL (ref 150–400)
RBC: 3.78 MIL/uL — ABNORMAL LOW (ref 3.87–5.11)
RDW: 15.9 % — ABNORMAL HIGH (ref 11.5–15.5)
WBC: 6.1 10*3/uL (ref 4.0–10.5)
nRBC: 0 % (ref 0.0–0.2)

## 2022-06-30 LAB — BASIC METABOLIC PANEL
Anion gap: 17 — ABNORMAL HIGH (ref 5–15)
BUN: 5 mg/dL — ABNORMAL LOW (ref 8–23)
CO2: 20 mmol/L — ABNORMAL LOW (ref 22–32)
Calcium: 9.1 mg/dL (ref 8.9–10.3)
Chloride: 103 mmol/L (ref 98–111)
Creatinine, Ser: 0.95 mg/dL (ref 0.44–1.00)
GFR, Estimated: >60 mL/min (ref >60)
Glucose, Bld: 69 mg/dL — ABNORMAL LOW (ref 70–99)
Potassium: 2.7 mmol/L — CL (ref 3.5–5.1)
Sodium: 140 mmol/L (ref 135–145)

## 2022-06-30 LAB — LIPASE, BLOOD: Lipase: 68 U/L — ABNORMAL HIGH (ref 11–51)

## 2022-06-30 LAB — HEPATIC FUNCTION PANEL
ALT: 6 U/L (ref 0–44)
AST: 16 U/L (ref 15–41)
Albumin: 2.8 g/dL — ABNORMAL LOW (ref 3.5–5.0)
Alkaline Phosphatase: 78 U/L (ref 38–126)
Bilirubin, Direct: 0.6 mg/dL — ABNORMAL HIGH (ref 0.0–0.2)
Indirect Bilirubin: 1.1 mg/dL — ABNORMAL HIGH (ref 0.3–0.9)
Total Bilirubin: 1.7 mg/dL — ABNORMAL HIGH (ref 0.3–1.2)
Total Protein: 5.4 g/dL — ABNORMAL LOW (ref 6.5–8.1)

## 2022-06-30 LAB — CBG MONITORING, ED
Glucose-Capillary: 65 mg/dL — ABNORMAL LOW (ref 70–99)
Glucose-Capillary: 57 mg/dL — ABNORMAL LOW (ref 70–99)
Glucose-Capillary: 131 mg/dL — ABNORMAL HIGH (ref 70–99)

## 2022-06-30 LAB — MAGNESIUM: Magnesium: 1.7 mg/dL (ref 1.7–2.4)

## 2022-06-30 LAB — TROPONIN I (HIGH SENSITIVITY)
Troponin I (High Sensitivity): 14 ng/L (ref <18)
Troponin I (High Sensitivity): 14 ng/L (ref <18)

## 2022-06-30 MED ORDER — ONDANSETRON HCL 4 MG PO TABS: 4.0000 mg | ORAL_TABLET | Freq: Four times a day (QID) | ORAL | 0 refills | Status: DC

## 2022-06-30 MED ORDER — LACTATED RINGERS IV BOLUS
1000.0000 mL | Freq: Once | INTRAVENOUS | Status: AC
  Administered 2022-06-30: 1000 mL via INTRAVENOUS

## 2022-06-30 MED ORDER — POTASSIUM CHLORIDE CRYS ER 20 MEQ PO TBCR
40.0000 meq | EXTENDED_RELEASE_TABLET | Freq: Once | ORAL | Status: AC
  Administered 2022-06-30: 40 meq via ORAL
  Filled 2022-06-30: qty 2

## 2022-06-30 MED ORDER — DEXTROSE 50 % IV SOLN
INTRAVENOUS | Status: AC
  Administered 2022-06-30: 50 mL
  Filled 2022-06-30: qty 50

## 2022-06-30 MED ORDER — ONDANSETRON HCL 4 MG/2ML IJ SOLN
4.0000 mg | Freq: Once | INTRAMUSCULAR | Status: AC
  Administered 2022-06-30: 4 mg via INTRAVENOUS
  Filled 2022-06-30: qty 2

## 2022-06-30 MED ORDER — POTASSIUM CHLORIDE 10 MEQ/100ML IV SOLN
10.0000 meq | INTRAVENOUS | Status: AC
  Administered 2022-06-30 (×2): 10 meq via INTRAVENOUS
  Filled 2022-06-30 (×2): qty 100

## 2022-06-30 MED ORDER — PANTOPRAZOLE SODIUM 40 MG IV SOLR
40.0000 mg | Freq: Once | INTRAVENOUS | Status: AC
  Administered 2022-06-30: 40 mg via INTRAVENOUS
  Filled 2022-06-30: qty 10

## 2022-06-30 MED ORDER — POTASSIUM CHLORIDE 10 MEQ PO PACK: 10.0000 meq | PACK | Freq: Every day | ORAL | 0 refills | Status: DC

## 2022-06-30 MED ORDER — IOHEXOL 350 MG/ML SOLN
50.0000 mL | Freq: Once | INTRAVENOUS | Status: AC | PRN
  Administered 2022-06-30: 50 mL via INTRAVENOUS

## 2022-06-30 MED ORDER — MAGNESIUM OXIDE -MG SUPPLEMENT 400 (240 MG) MG PO TABS
400.0000 mg | ORAL_TABLET | Freq: Once | ORAL | Status: AC
  Administered 2022-06-30: 400 mg via ORAL
  Filled 2022-06-30: qty 1

## 2022-06-30 NOTE — Discharge Instructions (Signed)
Are seen in the emergency department for your nausea, vomiting and chest pain.  Your potassium level was very low and we repleted this for you in the ER.  He should continue to take the potassium supplement for the next week and have your potassium level rechecked by your primary doctor.  You should continue to take your antacids as prescribed.  You can take Zofran as needed for nausea.  You should return to the emergency department for vomiting despite the nausea medications, worsening weakness if you pass out or if you have any other new or concerning symptoms.

## 2022-06-30 NOTE — ED Triage Notes (Signed)
Patient arrived from home with complaint of chest pain that started yesterday. Denies radiation, no sob. Patient received baby asa and 2 sl ntg by ems with some relief. Alert and oriented. Denies cough/cold

## 2022-06-30 NOTE — ED Notes (Signed)
Patient transported to CT 

## 2022-06-30 NOTE — ED Provider Triage Note (Signed)
Emergency Medicine Provider Triage Evaluation Note  Stephanie Benton , a 71 y.o. female  was evaluated in triage.  Pt complains of chest pain.  Started yesterday.  States it is in the middle of her chest radiates to the left shoulder and back.  Endorses associated chills shortness of breath.  Denies calf tenderness and malignancy.  States she has not had a cough or cold or fever.  EMS was called.  She was given 2 baby aspirin's and 2 sublingual nitro.  Chest pain improved.  Chest pain is worse with sitting up.  Chest pain feels like someone punched her in the chest.  Review of Systems  Positive: See above Negative: See above  Physical Exam  BP 101/68 (BP Location: Right Arm)   Pulse (!) 105   Temp 99.3 F (37.4 C) (Oral)   Resp 15   SpO2 97%  Gen:   Awake, no distress   Resp:  Normal effort  MSK:   Moves extremities without difficulty  Other:    Medical Decision Making  Medically screening exam initiated at 2:03 PM.  Appropriate orders placed.  Stephanie Benton was informed that the remainder of the evaluation will be completed by another provider, this initial triage assessment does not replace that evaluation, and the importance of remaining in the ED until their evaluation is complete.  Work up initiated   Gareth Eagle, PA-C 06/30/22 1405

## 2022-06-30 NOTE — ED Triage Notes (Signed)
Lab has called and reported a pot of 2.7  the pt is already an acuity 2   ed pa notified

## 2022-06-30 NOTE — ED Provider Notes (Signed)
[facility] EMERGENCY DEPARTMENT Provider Note   CSN: [id number] Arrival date & time: 06/30/22  1341     History  Chief Complaint  Patient presents with   Abdominal Pain    Stephanie Benton is a 71 y.o. female.  Patient is a 71 year old female with a past medical history of gastritis, cholecystectomy approximately 1 month ago for COPD and chronic pancreatitis presenting to the emergency department with chest pain and abdominal pain.  The patient states that yesterday she started to develop lower chest/epigastric abdominal pain.  She states that she has associated nausea and vomiting.  She denies any diarrhea or constipation or black or bloody stools.  She states she had mild shortness of breath.  She denies any fevers or cough.  The history is provided by the patient.  Abdominal Pain      Home Medications Prior to Admission medications   Medication Sig Start Date End Date Taking? Authorizing Provider  ondansetron (ZOFRAN) 4 MG tablet Take 1 tablet (4 mg total) by mouth every 6 (six) hours. 06/30/22  Yes Theresia Lo, Turkey K, DO  Potassium Chloride 10 MEQ PACK Take 10 mEq by mouth daily for 7 days. 06/30/22 07/07/22 Yes Kingsley, Benetta Spar K, DO  albuterol (PROVENTIL) (2.5 MG/3ML) 0.083% nebulizer solution Take 3 mLs (2.5 mg total) by nebulization every 6 (six) hours as needed for wheezing or shortness of breath. 11/27/14   Elson Areas, PA-C  albuterol (VENTOLIN HFA) 108 (90 Base) MCG/ACT inhaler Inhale 2 puffs into the lungs every 6 (six) hours as needed for wheezing or shortness of breath.    [provider]  dexlansoprazole (DEXILANT) 60 MG capsule Take 1 capsule by mouth daily. 06/14/22   [provider]  diclofenac sodium (VOLTAREN) 1 % GEL Apply 2 g topically daily as needed (for knee pain). 12/04/17   [provider]  dorzolamide (TRUSOPT) 2 % ophthalmic solution Place 1 drop into both eyes 2 (two) times daily.    [provider]  DULoxetine 40 MG CPEP Take 40 mg by mouth daily. 06/25/22 06/25/23  Rolly Salter, MD  feeding supplement (ENSURE ENLIVE / ENSURE PLUS) LIQD Take 237 mLs by mouth 2 (two) times daily between meals. 06/25/22   Rolly Salter, MD  FEROSUL 325 (65 Fe) MG tablet Take 325 mg by mouth daily with breakfast. 01/24/22   [provider]  latanoprost (XALATAN) 0.005 % ophthalmic solution Place 1 drop into both eyes at bedtime. 02/16/18   [provider]  nicotine (NICODERM CQ - DOSED IN MG/24 HOURS) 21 mg/24hr patch Place 1 patch (21 mg total) onto the skin daily. Patient not taking: Reported on 10/11/2019 02/19/18   Dhungel, Theda Belfast, MD  ondansetron (ZOFRAN-ODT) 4 MG disintegrating tablet Take 4 mg by mouth every 8 (eight) hours as needed for nausea or vomiting. 06/13/22   [provider]  polyethylene glycol powder (GLYCOLAX/MIRALAX) 17 GM/SCOOP powder Take 1 cap full (17 g) with water by mouth daily. Patient taking differently: Take 17 g by mouth daily as needed for mild constipation. 05/24/22   Zigmund Daniel., MD  pregabalin (LYRICA) 100 MG capsule Take 1 capsule (100 mg total) by mouth 3 (three) times daily. 06/25/22   Rolly Salter, MD  risperiDONE (RISPERDAL) 1 MG tablet Take 1 mg by mouth at bedtime.  11/24/14   [provider]  sertraline (ZOLOFT) 100 MG tablet Take 100 mg by mouth at bedtime.  05/05/13   [provider]  simvastatin (ZOCOR) 40 MG tablet Take 40 mg by mouth at bedtime.  05/09/15   [provider]  sucralfate (CARAFATE) 1 GM/10ML suspension Take 10 mLs (1 g total) by mouth 2 (two) times daily. 06/25/22 07/25/22  Rolly Salter, MD  SYMBICORT 160-4.5 MCG/ACT inhaler Inhale 1 puff into the lungs 2 (two) times daily as needed (for flares).  01/28/18   [provider]  telmisartan (MICARDIS) 80 MG tablet Take 80 mg by mouth at bedtime.  06/24/18   [provider]  tolterodine (DETROL LA) 2 MG 24 hr  capsule Take 2 mg by mouth daily. 05/15/22   [provider]  torsemide (DEMADEX) 10 MG tablet Take 10 mg by mouth daily as needed (for swelling in legs). 05/06/22   [provider]  traMADol (ULTRAM) 50 MG tablet Take 1 tablet (50 mg total) by mouth every 8 (eight) hours as needed for severe pain or moderate pain. 06/25/22   Rolly Salter, MD      Allergies    Fish-derived products, Morphine and related, Belsomra [suvorexant], Codeine, Fentanyl, Ibuprofen, Nsaids, Other, Penicillins, Sulfa antibiotics, and Tolmetin    Review of Systems   Review of Systems  Gastrointestinal:  Positive for abdominal pain.    Physical Exam Updated Vital Signs BP (!) 146/61   Pulse 69   Temp 98.3 F (36.8 C) (Oral)   Resp 10   Ht  (1.651 m)   Wt 82 kg   SpO2 100%   BMI 30.08 kg/m  Physical Exam Vitals and nursing note reviewed.  Constitutional:      General: She is not in acute distress.    Appearance: She is well-developed.  HENT:     Head: Normocephalic and atraumatic.     Mouth/Throat:     Mouth: Mucous membranes are moist.     Pharynx: Oropharynx is clear.  Eyes:     Extraocular Movements: Extraocular movements intact.  Cardiovascular:     Rate and Rhythm: Normal rate and regular rhythm.     Heart sounds: Normal heart sounds.  Pulmonary:     Effort: Pulmonary effort is normal.     Breath sounds: Normal breath sounds.  Abdominal:     General: Abdomen is flat.     Palpations: Abdomen is soft.     Tenderness: There is abdominal tenderness (Minimal in epigastrium, more severe tenderness in right lower quadrant) in the right lower quadrant and epigastric area. There is no guarding or rebound.  Skin:    General: Skin is warm and dry.  Neurological:     General: No focal deficit present.     Mental Status: She is alert and oriented to person, place, and time.  Psychiatric:        Mood and Affect: Mood normal.        Behavior: Behavior normal.     ED Results  / Procedures / Treatments   Labs (all labs ordered are listed, but only abnormal results are displayed) Labs Reviewed  BASIC METABOLIC PANEL - Abnormal; Notable for the following components:      Result Value   Potassium 2.7 (*)    CO2 20 (*)    Glucose, Bld 69 (*)    BUN 5 (*)    Anion gap 17 (*)    All other components within normal limits  CBC - Abnormal; Notable for the following components:   RBC 3.78 (*)    Hemoglobin 10.3 (*)    HCT 31.3 (*)  RDW 15.9 (*)    All other components within normal limits  HEPATIC FUNCTION PANEL - Abnormal; Notable for the following components:   Total Protein 5.4 (*)    Albumin 2.8 (*)    Total Bilirubin 1.7 (*)    Bilirubin, Direct 0.6 (*)    Indirect Bilirubin 1.1 (*)    All other components within normal limits  LIPASE, BLOOD - Abnormal; Notable for the following components:   Lipase 68 (*)    All other components within normal limits  URINALYSIS, ROUTINE W REFLEX MICROSCOPIC - Abnormal; Notable for the following components:   Color, Urine AMBER (*)    Specific Gravity, Urine 1.034 (*)    Glucose, UA 50 (*)    Ketones, ur 20 (*)    All other components within normal limits  CBG MONITORING, ED - Abnormal; Notable for the following components:   Glucose-Capillary 65 (*)    All other components within normal limits  CBG MONITORING, ED - Abnormal; Notable for the following components:   Glucose-Capillary 57 (*)    All other components within normal limits  CBG MONITORING, ED - Abnormal; Notable for the following components:   Glucose-Capillary 131 (*)    All other components within normal limits  MAGNESIUM  TROPONIN I (HIGH SENSITIVITY)  TROPONIN I (HIGH SENSITIVITY)    EKG EKG Interpretation  Date/Time:  Sunday June 30 2022 13:39:15 EST Ventricular Rate:  89 PR Interval:  160 QRS Duration: 72 QT Interval:  326 QTC Calculation: 396 R Axis:   38 Text Interpretation: Normal sinus rhythm Low voltage QRS Cannot rule out  Anteroseptal infarct , age undetermined Abnormal ECG U waves present Otherwise no significant change Confirmed by Elayne Snare ([id number]) on 06/30/2022 4:29:42 PM  Radiology CT ABDOMEN PELVIS W CONTRAST  Result Date: 06/30/2022 CLINICAL DATA:  Right lower quadrant abdominal pain. EXAM: CT ABDOMEN AND PELVIS WITH CONTRAST TECHNIQUE: Multidetector CT imaging of the abdomen and pelvis was performed using the standard protocol following bolus administration of intravenous contrast. RADIATION DOSE REDUCTION: This exam was performed according to the departmental dose-optimization program which includes automated exposure control, adjustment of the mA and/or kV according to patient size and/or use of iterative reconstruction technique. CONTRAST:  45mL OMNIPAQUE IOHEXOL 350 MG/ML SOLN COMPARISON:  06/21/2022 FINDINGS: Lower chest: Large hiatal hernia. Hepatobiliary: No suspicious focal abnormality within the liver parenchyma. Gallbladder surgically absent. No intrahepatic or extrahepatic biliary dilation. Pancreas: No focal mass lesion. No dilatation of the main duct. No intraparenchymal cyst. No peripancreatic edema. Spleen: No splenomegaly. No focal mass lesion. Adrenals/Urinary Tract: No adrenal nodule or mass. Kidneys unremarkable. No evidence for hydroureter. The urinary bladder appears normal for the degree of distention. Stomach/Bowel: Large hiatal hernia. Stomach otherwise unremarkable. Duodenum is normally positioned as is the ligament of Treitz. No small bowel wall thickening. No small bowel dilatation. The terminal ileum is normal. The appendix is not well visualized, but there is no edema or inflammation in the region of the cecum. No gross colonic mass. No colonic wall thickening. Vascular/Lymphatic: There is mild atherosclerotic calcification of the abdominal aorta without aneurysm. There is no gastrohepatic or hepatoduodenal ligament lymphadenopathy. No retroperitoneal or mesenteric lymphadenopathy. No  pelvic sidewall lymphadenopathy. Reproductive: Hysterectomy.  There is no adnexal mass. Other: No intraperitoneal free fluid. 6.5 x 4.9 cm chronic fluid collection associated with the patient's ventral mesh is stable as ring 6.4 x 4.6 cm today. Calcified structure just superficial to this in the subcutaneous fat is also unchanged and may  be a chronic hematoma or region of fat necrosis. Musculoskeletal: No worrisome lytic or sclerotic osseous abnormality. IMPRESSION: 1. Stable exam. No acute findings in the abdomen or pelvis. Specifically, no findings to explain the patient's history of right lower quadrant pain. 2. Large hiatal hernia. 3. Stable chronic fluid collection associated with the patient's ventral mesh, likely chronic hematoma or seroma. 4.  Aortic Atherosclerosis (ICD10-I70.0). Electronically Signed   By: Kennith Center M.D.   On: 06/30/2022 17:58   DG Chest 2 View  Result Date: 06/30/2022 CLINICAL DATA:  Chest pain since yesterday. EXAM: CHEST - 2 VIEW COMPARISON:  Chest x-ray 06/21/2022 FINDINGS: The cardiac silhouette, mediastinal and hilar contours are normal. Stable moderate-sized hiatal hernia. The lungs are clear. No pleural effusions. The bony thorax is intact. IMPRESSION: No acute cardiopulmonary findings. Electronically Signed   By: Rudie Meyer M.D.   On: 06/30/2022 15:04    Procedures Procedures    Medications Ordered in ED Medications  potassium chloride SA (KLOR-CON M) CR tablet 40 mEq (40 mEq Oral Given 06/30/22 1650)  potassium chloride 10 mEq in 100 mL IVPB (0 mEq Intravenous Stopped 06/30/22 1919)  lactated ringers bolus 1,000 mL (0 mLs Intravenous Stopped 06/30/22 2050)  ondansetron (ZOFRAN) injection 4 mg (4 mg Intravenous Given 06/30/22 1658)  iohexol (OMNIPAQUE) 350 MG/ML injection 50 mL (50 mLs Intravenous Contrast Given 06/30/22 1731)  magnesium oxide (MAG-OX) tablet 400 mg (400 mg Oral Given 06/30/22 1855)  pantoprazole (PROTONIX) injection 40 mg (40 mg  Intravenous Given 06/30/22 1855)  dextrose 50 % solution (50 mLs  Given 06/30/22 1905)    ED Course/ Medical Decision Making/ A&P Clinical Course as of 06/30/22 2114  Sun Jun 30, 2022  1830 No acute findings on CTAP, lipase downtrending. UA pending. [VK]    Clinical Course User Index [VK] Rexford Maus, DO                           Medical Decision Making Chest pain and abdominal painThis patient presents to the ED with chief complaint(s) of chest pain and abdominal pain with pertinent past medical history of gastritis, COPD, chronic pancreatitis which further complicates the presenting complaint. The complaint involves an extensive differential diagnosis and also carries with it a high risk of complications and morbidity.    The differential diagnosis includes ACS, gastritis, GERD, peptic ulcer, hepatitis, pancreatitis, UTI, intrabdominal infection, pneumonia, pneumothorax, pulmonary edema, pleural effusion   Additional history obtained: Additional history obtained: N/a Records reviewed previous admission documents -several recent admissions with similar complaints and was diagnosed with gastritis, had acute on chronic pancreatitis and hypokalemia  ED Course and Reassessment: Upon patient's arrival to the emergency department she was initially evaluated by provider in triage and had labs and EKG performed.  EKG was nonischemic and troponin was negative making ACS less likely.  She was found to be severely hypokalemia of 2.7 and will be repleted with oral and IV potassium.  Due to patient's epigastric pain, lipase will be performed.  Due to her right lower quadrant pain, UA and CT abdomen pelvis will be performed to evaluate for intra-abdominal infection.  She was given Zofran for nausea control.  She will be given PPI for pain control with her history of gastritis  Independent labs interpretation:  The following labs were independently interpreted: hypokalemia, mild hypoglycemia,  otherwise within normal range  Independent visualization of imaging: - I independently visualized the following imaging with scope of interpretation  limited to determining acute life threatening conditions related to emergency care: CXR, CTAP, which revealed no acute disease  Consultation: - Consulted or discussed management/test interpretation w/ external professional: N/A  Consideration for admission or further workup: Patient offered admission for continued electrolyte repletion however using shared decision making prefers discharge home.  She was given IV and p.o. potassium in the ED and was given oral potassium to go home with.  She was recommended primary care follow-up and was given strict return precautions. Social Determinants of health: N/A    Amount and/or Complexity of Data Reviewed Labs: ordered. Radiology: ordered.  Risk OTC drugs. Prescription drug management.          Final Clinical Impression(s) / ED Diagnoses Final diagnoses:  Hypokalemia  Generalized abdominal pain  Chest pain, unspecified type  Nausea    Rx / DC Orders ED Discharge Orders          Ordered    ondansetron (ZOFRAN) 4 MG tablet  Every 6 hours        06/30/22 2104    Potassium Chloride 10 MEQ PACK  Daily        06/30/22 2104              Rexford Maus, DO 06/30/22 2114

## 2022-07-02 ENCOUNTER — Encounter (HOSPITAL_COMMUNITY): Payer: Self-pay

## 2022-07-02 ENCOUNTER — Emergency Department (HOSPITAL_COMMUNITY)
Admission: EM | Admit: 2022-07-02 | Discharge: 2022-07-02 | Disposition: A | Discharge status: Home / Self Care | Payer: Medicare Other | Attending: Student | Admitting: Student

## 2022-07-02 DIAGNOSIS — R109 Unspecified abdominal pain: Secondary | ICD-10-CM | POA: Insufficient documentation

## 2022-07-02 DIAGNOSIS — R5381 Other malaise: Secondary | ICD-10-CM | POA: Insufficient documentation

## 2022-07-02 DIAGNOSIS — R531 Weakness: Secondary | ICD-10-CM | POA: Diagnosis not present

## 2022-07-02 DIAGNOSIS — Z5321 Procedure and treatment not carried out due to patient leaving prior to being seen by health care provider: Secondary | ICD-10-CM | POA: Insufficient documentation

## 2022-07-02 DIAGNOSIS — R112 Nausea with vomiting, unspecified: Secondary | ICD-10-CM | POA: Insufficient documentation

## 2022-07-02 DIAGNOSIS — E876 Hypokalemia: Secondary | ICD-10-CM | POA: Diagnosis not present

## 2022-07-02 NOTE — ED Triage Notes (Signed)
Pt arrives POV for eval of N/V Abd pain since last night. PCP called her this AM and reported hypokalemia- unsure of level. Pt reports gen weakness and malaise. Seen 2 days ago for low K as well.

## 2022-07-02 NOTE — ED Notes (Signed)
Pt is not answering when being called and cannot be found in the lobby. Has been called by registration and sort with no response

## 2022-07-07 ENCOUNTER — Inpatient Hospital Stay (HOSPITAL_COMMUNITY)
Admission: EM | Admit: 2022-07-07 | Discharge: 2022-07-13 | DRG: 439 | Disposition: A | Discharge status: Home Health | Payer: Medicare Other | Attending: Internal Medicine | Admitting: Internal Medicine

## 2022-07-07 DIAGNOSIS — E538 Deficiency of other specified B group vitamins: Secondary | ICD-10-CM | POA: Diagnosis present

## 2022-07-07 DIAGNOSIS — Z79899 Other long term (current) drug therapy: Secondary | ICD-10-CM

## 2022-07-07 DIAGNOSIS — G894 Chronic pain syndrome: Secondary | ICD-10-CM | POA: Diagnosis present

## 2022-07-07 DIAGNOSIS — Z880 Allergy status to penicillin: Secondary | ICD-10-CM

## 2022-07-07 DIAGNOSIS — K209 Esophagitis, unspecified without bleeding: Secondary | ICD-10-CM | POA: Diagnosis present

## 2022-07-07 DIAGNOSIS — Z882 Allergy status to sulfonamides status: Secondary | ICD-10-CM

## 2022-07-07 DIAGNOSIS — E7800 Pure hypercholesterolemia, unspecified: Secondary | ICD-10-CM | POA: Diagnosis present

## 2022-07-07 DIAGNOSIS — K2100 Gastro-esophageal reflux disease with esophagitis, without bleeding: Secondary | ICD-10-CM | POA: Diagnosis present

## 2022-07-07 DIAGNOSIS — I2510 Atherosclerotic heart disease of native coronary artery without angina pectoris: Secondary | ICD-10-CM | POA: Diagnosis present

## 2022-07-07 DIAGNOSIS — D539 Nutritional anemia, unspecified: Secondary | ICD-10-CM | POA: Diagnosis present

## 2022-07-07 DIAGNOSIS — K859 Acute pancreatitis without necrosis or infection, unspecified: Principal | ICD-10-CM | POA: Diagnosis present

## 2022-07-07 DIAGNOSIS — Z885 Allergy status to narcotic agent status: Secondary | ICD-10-CM

## 2022-07-07 DIAGNOSIS — F17210 Nicotine dependence, cigarettes, uncomplicated: Secondary | ICD-10-CM | POA: Diagnosis present

## 2022-07-07 DIAGNOSIS — J449 Chronic obstructive pulmonary disease, unspecified: Secondary | ICD-10-CM | POA: Diagnosis present

## 2022-07-07 DIAGNOSIS — H409 Unspecified glaucoma: Secondary | ICD-10-CM | POA: Diagnosis present

## 2022-07-07 DIAGNOSIS — Z8249 Family history of ischemic heart disease and other diseases of the circulatory system: Secondary | ICD-10-CM

## 2022-07-07 DIAGNOSIS — E876 Hypokalemia: Secondary | ICD-10-CM | POA: Diagnosis present

## 2022-07-07 DIAGNOSIS — K429 Umbilical hernia without obstruction or gangrene: Secondary | ICD-10-CM | POA: Diagnosis present

## 2022-07-07 DIAGNOSIS — K861 Other chronic pancreatitis: Secondary | ICD-10-CM | POA: Diagnosis present

## 2022-07-07 DIAGNOSIS — I10 Essential (primary) hypertension: Secondary | ICD-10-CM | POA: Diagnosis present

## 2022-07-07 DIAGNOSIS — F419 Anxiety disorder, unspecified: Secondary | ICD-10-CM | POA: Diagnosis present

## 2022-07-07 DIAGNOSIS — R7989 Other specified abnormal findings of blood chemistry: Secondary | ICD-10-CM | POA: Diagnosis present

## 2022-07-07 DIAGNOSIS — R17 Unspecified jaundice: Secondary | ICD-10-CM | POA: Diagnosis present

## 2022-07-07 DIAGNOSIS — F418 Other specified anxiety disorders: Secondary | ICD-10-CM | POA: Diagnosis present

## 2022-07-07 DIAGNOSIS — F319 Bipolar disorder, unspecified: Secondary | ICD-10-CM | POA: Diagnosis present

## 2022-07-07 DIAGNOSIS — Z886 Allergy status to analgesic agent status: Secondary | ICD-10-CM

## 2022-07-07 DIAGNOSIS — N3289 Other specified disorders of bladder: Secondary | ICD-10-CM | POA: Diagnosis present

## 2022-07-07 DIAGNOSIS — Z888 Allergy status to other drugs, medicaments and biological substances status: Secondary | ICD-10-CM

## 2022-07-07 LAB — CBC WITH DIFFERENTIAL/PLATELET
Abs Immature Granulocytes: 0.03 10*3/uL (ref 0.00–0.07)
Basophils Absolute: 0 10*3/uL (ref 0.0–0.1)
Basophils Relative: 1 %
Eosinophils Absolute: 0 10*3/uL (ref 0.0–0.5)
Eosinophils Relative: 1 %
HCT: 31.6 % — ABNORMAL LOW (ref 36.0–46.0)
Hemoglobin: 10.2 g/dL — ABNORMAL LOW (ref 12.0–15.0)
Immature Granulocytes: 1 %
Lymphocytes Relative: 19 %
Lymphs Abs: 1.1 10*3/uL (ref 0.7–4.0)
MCH: 26.4 pg (ref 26.0–34.0)
MCHC: 32.3 g/dL (ref 30.0–36.0)
MCV: 81.7 fL (ref 80.0–100.0)
Monocytes Absolute: 0.5 10*3/uL (ref 0.1–1.0)
Monocytes Relative: 9 %
Neutro Abs: 4 10*3/uL (ref 1.7–7.7)
Neutrophils Relative %: 69 %
Platelets: 277 10*3/uL (ref 150–400)
RBC: 3.87 MIL/uL (ref 3.87–5.11)
RDW: 16.3 % — ABNORMAL HIGH (ref 11.5–15.5)
WBC: 5.7 10*3/uL (ref 4.0–10.5)
nRBC: 0.4 % — ABNORMAL HIGH (ref 0.0–0.2)

## 2022-07-07 LAB — LIPASE, BLOOD: Lipase: 92 U/L — ABNORMAL HIGH (ref 11–51)

## 2022-07-07 LAB — COMPREHENSIVE METABOLIC PANEL
ALT: 9 U/L (ref 0–44)
AST: 14 U/L — ABNORMAL LOW (ref 15–41)
Albumin: 2.9 g/dL — ABNORMAL LOW (ref 3.5–5.0)
Alkaline Phosphatase: 86 U/L (ref 38–126)
Anion gap: 16 — ABNORMAL HIGH (ref 5–15)
BUN: 6 mg/dL — ABNORMAL LOW (ref 8–23)
CO2: 19 mmol/L — ABNORMAL LOW (ref 22–32)
Calcium: 8.9 mg/dL (ref 8.9–10.3)
Chloride: 103 mmol/L (ref 98–111)
Creatinine, Ser: 0.8 mg/dL (ref 0.44–1.00)
GFR, Estimated: >60 mL/min (ref >60)
Glucose, Bld: 68 mg/dL — ABNORMAL LOW (ref 70–99)
Potassium: 2.7 mmol/L — CL (ref 3.5–5.1)
Sodium: 138 mmol/L (ref 135–145)
Total Bilirubin: 3.1 mg/dL — ABNORMAL HIGH (ref 0.3–1.2)
Total Protein: 6 g/dL — ABNORMAL LOW (ref 6.5–8.1)

## 2022-07-07 NOTE — ED Triage Notes (Signed)
Patient arrived with complaints of lower abdominal pain over the last 4 days. Two days with no bowel movement, states she stopped taking miralax.

## 2022-07-07 NOTE — ED Provider Triage Note (Signed)
Emergency Medicine Provider Triage Evaluation Note  Stephanie Benton , a 71 y.o. female  was evaluated in triage.  Pt complains of abdominal pain, nausea, vomiting. She states that same has been ongoing for the past 4 days. States she is having normal bowel movements. States pain is similar to her previous visits this past month. Denies any fevers or chills. Hx recent cholecystectomy.   Review of Systems  Positive:  Negative:   Physical Exam  BP (!) 149/81 (BP Location: Right Arm)   Pulse (!) 106   Temp 98.4 F (36.9 C) (Oral)   Resp 18   Ht 5\' 5"  (1.651 m)   Wt 81.6 kg   SpO2 100%   BMI 29.95 kg/m  Gen:   Awake, no distress   Resp:  Normal effort  MSK:   Moves extremities without difficulty  Other:    Medical Decision Making  Medically screening exam initiated at 10:18 PM.  Appropriate orders placed.  Stephanie Benton was informed that the remainder of the evaluation will be completed by another provider, this initial triage assessment does not replace that evaluation, and the importance of remaining in the ED until their evaluation is complete.  Of note, patient was here on 11/26 with similar complaints and had CT imaging that did not reveal any acute findings   12/26, Stephanie Benton 07/07/22 2221

## 2022-07-08 ENCOUNTER — Emergency Department (HOSPITAL_COMMUNITY): Payer: Medicare Other

## 2022-07-08 ENCOUNTER — Encounter (HOSPITAL_COMMUNITY): Payer: Self-pay

## 2022-07-08 DIAGNOSIS — Z886 Allergy status to analgesic agent status: Secondary | ICD-10-CM | POA: Diagnosis not present

## 2022-07-08 DIAGNOSIS — K861 Other chronic pancreatitis: Secondary | ICD-10-CM

## 2022-07-08 DIAGNOSIS — K449 Diaphragmatic hernia without obstruction or gangrene: Secondary | ICD-10-CM | POA: Diagnosis not present

## 2022-07-08 DIAGNOSIS — I10 Essential (primary) hypertension: Secondary | ICD-10-CM | POA: Diagnosis present

## 2022-07-08 DIAGNOSIS — D539 Nutritional anemia, unspecified: Secondary | ICD-10-CM | POA: Diagnosis present

## 2022-07-08 DIAGNOSIS — E538 Deficiency of other specified B group vitamins: Secondary | ICD-10-CM | POA: Diagnosis present

## 2022-07-08 DIAGNOSIS — K2100 Gastro-esophageal reflux disease with esophagitis, without bleeding: Secondary | ICD-10-CM | POA: Diagnosis present

## 2022-07-08 DIAGNOSIS — K859 Acute pancreatitis without necrosis or infection, unspecified: Secondary | ICD-10-CM | POA: Diagnosis present

## 2022-07-08 DIAGNOSIS — Z885 Allergy status to narcotic agent status: Secondary | ICD-10-CM | POA: Diagnosis not present

## 2022-07-08 DIAGNOSIS — Z882 Allergy status to sulfonamides status: Secondary | ICD-10-CM | POA: Diagnosis not present

## 2022-07-08 DIAGNOSIS — N3289 Other specified disorders of bladder: Secondary | ICD-10-CM | POA: Diagnosis present

## 2022-07-08 DIAGNOSIS — I2510 Atherosclerotic heart disease of native coronary artery without angina pectoris: Secondary | ICD-10-CM | POA: Diagnosis present

## 2022-07-08 DIAGNOSIS — G894 Chronic pain syndrome: Secondary | ICD-10-CM | POA: Diagnosis present

## 2022-07-08 DIAGNOSIS — Z79899 Other long term (current) drug therapy: Secondary | ICD-10-CM | POA: Diagnosis not present

## 2022-07-08 DIAGNOSIS — F418 Other specified anxiety disorders: Secondary | ICD-10-CM | POA: Diagnosis not present

## 2022-07-08 DIAGNOSIS — H409 Unspecified glaucoma: Secondary | ICD-10-CM | POA: Diagnosis present

## 2022-07-08 DIAGNOSIS — E876 Hypokalemia: Secondary | ICD-10-CM | POA: Diagnosis present

## 2022-07-08 DIAGNOSIS — F419 Anxiety disorder, unspecified: Secondary | ICD-10-CM | POA: Diagnosis present

## 2022-07-08 DIAGNOSIS — Z8249 Family history of ischemic heart disease and other diseases of the circulatory system: Secondary | ICD-10-CM | POA: Diagnosis not present

## 2022-07-08 DIAGNOSIS — R17 Unspecified jaundice: Secondary | ICD-10-CM | POA: Diagnosis present

## 2022-07-08 DIAGNOSIS — K429 Umbilical hernia without obstruction or gangrene: Secondary | ICD-10-CM | POA: Diagnosis present

## 2022-07-08 DIAGNOSIS — F17210 Nicotine dependence, cigarettes, uncomplicated: Secondary | ICD-10-CM | POA: Diagnosis present

## 2022-07-08 DIAGNOSIS — F319 Bipolar disorder, unspecified: Secondary | ICD-10-CM | POA: Diagnosis present

## 2022-07-08 DIAGNOSIS — Z880 Allergy status to penicillin: Secondary | ICD-10-CM | POA: Diagnosis not present

## 2022-07-08 DIAGNOSIS — E7800 Pure hypercholesterolemia, unspecified: Secondary | ICD-10-CM | POA: Diagnosis present

## 2022-07-08 DIAGNOSIS — J449 Chronic obstructive pulmonary disease, unspecified: Secondary | ICD-10-CM | POA: Diagnosis present

## 2022-07-08 LAB — MAGNESIUM: Magnesium: 2 mg/dL (ref 1.7–2.4)

## 2022-07-08 LAB — CBG MONITORING, ED: Glucose-Capillary: 87 mg/dL (ref 70–99)

## 2022-07-08 MED ORDER — RISPERIDONE 1 MG PO TABS
1.0000 mg | ORAL_TABLET | Freq: Every day | ORAL | Status: DC
  Administered 2022-07-08 – 2022-07-12 (×5): 1 mg via ORAL
  Filled 2022-07-08 (×5): qty 1

## 2022-07-08 MED ORDER — HYDROMORPHONE HCL 1 MG/ML IJ SOLN
1.0000 mg | INTRAMUSCULAR | Status: DC | PRN
  Administered 2022-07-08 – 2022-07-12 (×9): 1 mg via INTRAVENOUS
  Filled 2022-07-08 (×9): qty 1

## 2022-07-08 MED ORDER — ONDANSETRON HCL 4 MG PO TABS: 4.0000 mg | ORAL_TABLET | Freq: Four times a day (QID) | ORAL | Status: DC | PRN

## 2022-07-08 MED ORDER — HYDROMORPHONE HCL 1 MG/ML IJ SOLN
1.0000 mg | Freq: Once | INTRAMUSCULAR | Status: AC
  Administered 2022-07-08: 1 mg via INTRAVENOUS
  Filled 2022-07-08: qty 1

## 2022-07-08 MED ORDER — IOHEXOL 300 MG/ML  SOLN
100.0000 mL | Freq: Once | INTRAMUSCULAR | Status: AC | PRN
  Administered 2022-07-08: 100 mL via INTRAVENOUS

## 2022-07-08 MED ORDER — ACETAMINOPHEN 650 MG RE SUPP: 650.0000 mg | Freq: Four times a day (QID) | RECTAL | Status: DC | PRN

## 2022-07-08 MED ORDER — POTASSIUM CHLORIDE IN NACL 40-0.9 MEQ/L-% IV SOLN
INTRAVENOUS | Status: AC
  Filled 2022-07-08: qty 1000

## 2022-07-08 MED ORDER — IRBESARTAN 150 MG PO TABS
300.0000 mg | ORAL_TABLET | Freq: Every day | ORAL | Status: DC
  Administered 2022-07-09 – 2022-07-13 (×4): 300 mg via ORAL
  Filled 2022-07-08: qty 1
  Filled 2022-07-08 (×3): qty 2

## 2022-07-08 MED ORDER — POTASSIUM CHLORIDE CRYS ER 20 MEQ PO TBCR: 40.0000 meq | EXTENDED_RELEASE_TABLET | Freq: Once | ORAL | Status: DC

## 2022-07-08 MED ORDER — SUCRALFATE 1 GM/10ML PO SUSP
1.0000 g | Freq: Two times a day (BID) | ORAL | Status: DC
  Administered 2022-07-09 – 2022-07-13 (×8): 1 g via ORAL
  Filled 2022-07-08 (×8): qty 10

## 2022-07-08 MED ORDER — LATANOPROST 0.005 % OP SOLN
1.0000 [drp] | Freq: Every day | OPHTHALMIC | Status: DC
  Administered 2022-07-09 – 2022-07-12 (×4): 1 [drp] via OPHTHALMIC
  Filled 2022-07-08: qty 2.5

## 2022-07-08 MED ORDER — POTASSIUM CHLORIDE IN NACL 20-0.9 MEQ/L-% IV SOLN
INTRAVENOUS | Status: AC
  Filled 2022-07-08 (×2): qty 1000

## 2022-07-08 MED ORDER — DORZOLAMIDE HCL 2 % OP SOLN
1.0000 [drp] | Freq: Two times a day (BID) | OPHTHALMIC | Status: DC
  Administered 2022-07-09 – 2022-07-13 (×8): 1 [drp] via OPHTHALMIC
  Filled 2022-07-08: qty 10

## 2022-07-08 MED ORDER — ALUM & MAG HYDROXIDE-SIMETH 200-200-20 MG/5ML PO SUSP
30.0000 mL | Freq: Once | ORAL | Status: AC
  Administered 2022-07-08: 30 mL via ORAL
  Filled 2022-07-08: qty 30

## 2022-07-08 MED ORDER — SERTRALINE HCL 100 MG PO TABS
100.0000 mg | ORAL_TABLET | Freq: Every day | ORAL | Status: DC
  Administered 2022-07-08 – 2022-07-12 (×5): 100 mg via ORAL
  Filled 2022-07-08: qty 2
  Filled 2022-07-08 (×4): qty 1

## 2022-07-08 MED ORDER — POTASSIUM CHLORIDE 10 MEQ/100ML IV SOLN
10.0000 meq | INTRAVENOUS | Status: DC
  Administered 2022-07-08 (×2): 10 meq via INTRAVENOUS
  Filled 2022-07-08 (×3): qty 100

## 2022-07-08 MED ORDER — FESOTERODINE FUMARATE ER 4 MG PO TB24
4.0000 mg | ORAL_TABLET | Freq: Every day | ORAL | Status: DC
  Administered 2022-07-09 – 2022-07-13 (×4): 4 mg via ORAL
  Filled 2022-07-08 (×5): qty 1

## 2022-07-08 MED ORDER — VENLAFAXINE HCL ER 37.5 MG PO CP24
37.5000 mg | ORAL_CAPSULE | Freq: Every day | ORAL | Status: DC
  Administered 2022-07-09 – 2022-07-13 (×4): 37.5 mg via ORAL
  Filled 2022-07-08 (×5): qty 1

## 2022-07-08 MED ORDER — LIDOCAINE VISCOUS HCL 2 % MT SOLN
15.0000 mL | Freq: Once | OROMUCOSAL | Status: AC
  Administered 2022-07-08: 15 mL via ORAL
  Filled 2022-07-08: qty 15

## 2022-07-08 MED ORDER — POTASSIUM CHLORIDE 20 MEQ PO PACK: 40.0000 meq | PACK | Freq: Every day | ORAL | Status: DC

## 2022-07-08 MED ORDER — ACETAMINOPHEN 325 MG PO TABS: 650.0000 mg | ORAL_TABLET | Freq: Four times a day (QID) | ORAL | Status: DC | PRN

## 2022-07-08 MED ORDER — PANTOPRAZOLE SODIUM 40 MG IV SOLR
40.0000 mg | INTRAVENOUS | Status: DC
  Administered 2022-07-08 – 2022-07-09 (×2): 40 mg via INTRAVENOUS
  Filled 2022-07-08 (×2): qty 10

## 2022-07-08 MED ORDER — ONDANSETRON HCL 4 MG/2ML IJ SOLN
4.0000 mg | Freq: Once | INTRAMUSCULAR | Status: AC
  Administered 2022-07-08: 4 mg via INTRAVENOUS
  Filled 2022-07-08: qty 2

## 2022-07-08 MED ORDER — PROCHLORPERAZINE EDISYLATE 10 MG/2ML IJ SOLN
10.0000 mg | Freq: Four times a day (QID) | INTRAMUSCULAR | Status: DC | PRN
  Administered 2022-07-08 – 2022-07-11 (×3): 10 mg via INTRAVENOUS
  Filled 2022-07-08 (×3): qty 2

## 2022-07-08 MED ORDER — ONDANSETRON HCL 4 MG/2ML IJ SOLN
4.0000 mg | Freq: Four times a day (QID) | INTRAMUSCULAR | Status: DC | PRN
  Administered 2022-07-11: 4 mg via INTRAVENOUS
  Filled 2022-07-08: qty 2

## 2022-07-08 NOTE — ED Provider Notes (Signed)
Accepted handoff at shift change from [facility]. Please see prior provider note for more detail.   Briefly: Patient is 71 y.o. with hypertension, hyperlipidemia, status post appendectomy and cholecystectomy presenting for generalized abdominal pain.  Pain for the last 4 days and no bowel movement last 2 days.  Also endorsing intermittent vomiting.  Continues to endorse generalized abdominal pain which has now improved after Dilaudid x 1.  DDX: concern for intra-abdominal pathology, ACS, generalized abdominal pain  Plan: pain control then admission for uncontrolled generalized ab pain and vomiting    Physical Exam  BP (!) 151/70   Pulse (!) 110   Temp 98.2 F (36.8 C) (Oral)   Resp 16   Ht 5\' 5"  (1.651 m)   Wt 81.6 kg   SpO2 100%   BMI 29.95 kg/m   Physical Exam  Procedures  Procedures  ED Course / MDM   Clinical Course as of 07/08/22 0728  Mon Jul 08, 2022  Jul 10, 2022 Reevaluated patient.  Stated pain was still a 7 out of 10 but nausea had improved.  Treated with Dilaudid. [JR]    Clinical Course User Index [JR] 1884, PA-C   Medical Decision Making Amount and/or Complexity of Data Reviewed Labs: ordered. Radiology: ordered.  Risk OTC drugs. Prescription drug management. Decision regarding hospitalization.   Admitted to hospital service for ongoing abdominal pain, concern for pancreatitis, hypokalemia and hyperbilirubinemia.  Reached out to GI who will see her in consult       Gareth Eagle, PA-C 07/08/22 1213    14/04/23, MD 07/08/22 [phone]

## 2022-07-08 NOTE — ED Notes (Signed)
Family members requesting to speak to social work about getting rehab placement and about getting more hours for home health.

## 2022-07-08 NOTE — H&P (Signed)
History and Physical    Patient: Stephanie Benton UTM:[id number] DOB: [birth year] DOA: 07/07/2022 DOS: the patient was seen and examined on 07/08/2022 PCP: Sharmon Revere, MD  Patient coming from: Home  Chief Complaint:  Chief Complaint  Patient presents with   Abdominal Pain   HPI: Stephanie Benton is a 71 y.o. female with medical history significant of hyperlipidemia, recurrent pancreatitis, chronic abdominal pain, lumbar spine spondylosis, metabolic acidosis, hyperkalemia, hypertension, hyperlipidemia depression with anxiety, bipolar 1 disorder, B12 deficiency, COPD, tobacco use who is coming to the emergency department due to abdominal pain associated with multiple episodes of emesis since she ate some [place] fries on Thursday.  Her appetite is decreased.  No diarrhea, constipation, melena or hematochezia.  No flank pain, dysuria, frequency or hematuria. No fever, chills or night sweats. No sore throat, rhinorrhea, dyspnea, wheezing or hemoptysis.  No chest pain, palpitations, diaphoresis, PND, orthopnea or pitting edema of the lower extremities.   No polyuria, polydipsia, polyphagia or blurred vision.  ED course: Initial vital signs were temperature 98.4 F, pulse 77, respiration 18, BP 149/81 mmHg and O2 sat 100% on room air.  The patient received Maalox 30 mL p.o. x 1, viscous lidocaine 2% 50 mL p.o. x 1, hydromorphone 1 mg IVP x 2 and ondansetron 4 mg IVP x 1.  Lab work: CBC with a white count of 5.7, hemoglobin 10.2 g/dL platelets 568.  Lipase 92 units/L.  CMP with a potassium of 2.7 and CO2 of 19 mmol/L, anion gap is 16.  Glucose 68 and total bilirubin has increased to 3.1 from 1.7 mg/dL.  Renal function is unremarkable.  Total protein 6.0 and albumin 2.9 g/dL.  AST 14 ALT 9 alkaline phosphatase 86.  Imaging: CT abdomen/pelvis with contrast shows cystitis versus bladder distention.  There is a large hiatal hernia.  There is aortic and coronary artery atherosclerosis.  Pelvic floor laxity  with lower urethral insertions but no cystocele he.  Small umbilical, infraumbilical and inguinal hernias.  Stable 6.7 x 4.9 cm chronic fluid collection associated with the ventral mesh hernia repair.  Osteopenia and degenerative change.   Review of Systems: As mentioned in the history of present illness. All other systems reviewed and are negative. Past Medical History:  Diagnosis Date   Hyperlipidemia    Hypertension    Past Surgical History:  Procedure Laterality Date   ABDOMINAL HYSTERECTOMY     APPENDECTOMY     BIOPSY  05/17/2022   Procedure: BIOPSY;  Surgeon: Jeani Hawking, MD;  Location: [facility] ENDOSCOPY;  Service: Gastroenterology;;   CHOLECYSTECTOMY N/A 05/22/2022   Procedure: LAPAROSCOPIC CHOLECYSTECTOMY WITH INTRAOPERATIVE CHOLANGIOGRAM;  Surgeon: Abigail Miyamoto, MD;  Location: [facility] OR;  Service: General;  Laterality: N/A;   ESOPHAGOGASTRODUODENOSCOPY (EGD) WITH PROPOFOL N/A 05/17/2022   Procedure: ESOPHAGOGASTRODUODENOSCOPY (EGD) WITH PROPOFOL;  Surgeon: Jeani Hawking, MD;  Location: [facility] ENDOSCOPY;  Service: Gastroenterology;  Laterality: N/A;   HERNIA REPAIR     TONSILLECTOMY     TUBAL LIGATION     Social History:  reports that she has been smoking cigarettes. She has been smoking an average of .75 packs per day. She has never used smokeless tobacco. She reports that she does not drink alcohol and does not use drugs.  Allergies  Allergen Reactions   Fish-Derived Products Anaphylaxis   Morphine And Related Anaphylaxis and Swelling   Belsomra [Suvorexant] Other (See Comments)    Caused nightmares   Codeine Nausea And Vomiting   Fentanyl Hives   Ibuprofen Nausea Only and  Other (See Comments)    Severe nausea- history of stomach ulcers   Nsaids Other (See Comments)    History of stomach ulcers   Other Other (See Comments)    History of stomach ulcers   Penicillins Hives    Did it involve swelling of the face/tongue/throat, SOB, or low BP? Yes Did it involve sudden or  severe rash/hives, skin peeling, or any reaction on the inside of your mouth or nose? Yes Did you need to seek medical attention at a hospital or doctor's office? Yes When did it last happen? More than 10 years ago If all above answers are "NO", may proceed with cephalosporin use.    Sulfa Antibiotics Swelling    Site of swelling not recalled   Tolmetin Other (See Comments)    History of stomach ulcers    Family History  Problem Relation Age of Onset   Heart failure Mother    Cancer Father    Cancer Sister     Prior to Admission medications   Medication Sig Start Date End Date Taking? Authorizing Provider  albuterol (PROVENTIL) (2.5 MG/3ML) 0.083% nebulizer solution Take 3 mLs (2.5 mg total) by nebulization every 6 (six) hours as needed for wheezing or shortness of breath. 11/27/14   Elson Areas, PA-C  albuterol (VENTOLIN HFA) 108 (90 Base) MCG/ACT inhaler Inhale 2 puffs into the lungs every 6 (six) hours as needed for wheezing or shortness of breath.    [provider]  dexlansoprazole (DEXILANT) 60 MG capsule Take 1 capsule by mouth daily. 06/14/22   [provider]  diclofenac sodium (VOLTAREN) 1 % GEL Apply 2 g topically daily as needed (for knee pain). 12/04/17   [provider]  dorzolamide (TRUSOPT) 2 % ophthalmic solution Place 1 drop into both eyes 2 (two) times daily.    [provider]  DULoxetine 40 MG CPEP Take 40 mg by mouth daily. 06/25/22 06/25/23  Rolly Salter, MD  feeding supplement (ENSURE ENLIVE / ENSURE PLUS) LIQD Take 237 mLs by mouth 2 (two) times daily between meals. 06/25/22   Rolly Salter, MD  FEROSUL 325 (65 Fe) MG tablet Take 325 mg by mouth daily with breakfast. 01/24/22   [provider]  latanoprost (XALATAN) 0.005 % ophthalmic solution Place 1 drop into both eyes at bedtime. 02/16/18   [provider]  nicotine (NICODERM CQ - DOSED IN MG/24 HOURS) 21 mg/24hr patch Place 1 patch (21 mg total) onto  the skin daily. Patient not taking: Reported on 10/11/2019 02/19/18   Dhungel, Theda Belfast, MD  ondansetron (ZOFRAN) 4 MG tablet Take 1 tablet (4 mg total) by mouth every 6 (six) hours. 06/30/22   Elayne Snare K, DO  ondansetron (ZOFRAN-ODT) 4 MG disintegrating tablet Take 4 mg by mouth every 8 (eight) hours as needed for nausea or vomiting. 06/13/22   [provider]  polyethylene glycol powder (GLYCOLAX/MIRALAX) 17 GM/SCOOP powder Take 1 cap full (17 g) with water by mouth daily. Patient taking differently: Take 17 g by mouth daily as needed for mild constipation. 05/24/22   Zigmund Daniel., MD  Potassium Chloride 10 MEQ PACK Take 10 mEq by mouth daily for 7 days. 06/30/22 07/07/22  Elayne Snare K, DO  pregabalin (LYRICA) 100 MG capsule Take 1 capsule (100 mg total) by mouth 3 (three) times daily. 06/25/22   Rolly Salter, MD  risperiDONE (RISPERDAL) 1 MG tablet Take 1 mg by mouth at bedtime.  11/24/14   [provider]  sertraline (ZOLOFT) 100 MG tablet Take 100 mg by mouth at bedtime.  05/05/13   [provider]  simvastatin (ZOCOR) 40 MG tablet Take 40 mg by mouth at bedtime.  05/09/15   [provider]  sucralfate (CARAFATE) 1 GM/10ML suspension Take 10 mLs (1 g total) by mouth 2 (two) times daily. 06/25/22 07/25/22  Rolly Salter, MD  SYMBICORT 160-4.5 MCG/ACT inhaler Inhale 1 puff into the lungs 2 (two) times daily as needed (for flares).  01/28/18   [provider]  telmisartan (MICARDIS) 80 MG tablet Take 80 mg by mouth at bedtime.  06/24/18   [provider]  tolterodine (DETROL LA) 2 MG 24 hr capsule Take 2 mg by mouth daily. 05/15/22   [provider]  torsemide (DEMADEX) 10 MG tablet Take 10 mg by mouth daily as needed (for swelling in legs). 05/06/22   [provider]  traMADol (ULTRAM) 50 MG tablet Take 1 tablet (50 mg total) by mouth every 8 (eight) hours as needed for severe pain or moderate pain.  06/25/22   Rolly Salter, MD    Physical Exam: Vitals:   07/08/22 0530 07/08/22 0630 07/08/22 0645 07/08/22 0747  BP: (!) 151/70 122/65    Pulse: (!) 110 81 79   Resp: 16 (!) 9 12   Temp:    98.3 F (36.8 C)  TempSrc:    Oral  SpO2: 100% 100% 100%   Weight:      Height:       Physical Exam Vitals and nursing note reviewed.  Constitutional:      General: She is awake. She is not in acute distress.    Appearance: She is well-developed. She is ill-appearing.  HENT:     Head: Normocephalic.     Nose: No rhinorrhea.     Mouth/Throat:     Mouth: Mucous membranes are moist.  Eyes:     General: Scleral icterus present.     Pupils: Pupils are equal, round, and reactive to light.  Neck:     Vascular: No JVD.  Cardiovascular:     Rate and Rhythm: Normal rate and regular rhythm.     Heart sounds: S1 normal and S2 normal.  Pulmonary:     Effort: Pulmonary effort is normal.     Breath sounds: No wheezing, rhonchi or rales.  Abdominal:     General: Abdomen is protuberant. Bowel sounds are normal. There is no distension.     Palpations: Abdomen is soft.     Tenderness: There is abdominal tenderness in the right upper quadrant and epigastric area. There is right CVA tenderness. There is no left CVA tenderness, guarding or rebound.  Musculoskeletal:     Cervical back: Neck supple.     Right lower leg: No edema.     Left lower leg: No edema.  Skin:    General: Skin is warm and dry.  Neurological:     General: No focal deficit present.     Mental Status: She is alert and oriented to person, place, and time.  Psychiatric:        Mood and Affect: Mood normal.        Behavior: Behavior is cooperative.   Data Reviewed:  There are no new results to review at this time.  Assessment and Plan: Principal Problem:   Acute on chronic pancreatitis (HCC) Observation/telemetry. Continue IV fluids. Keep n.p.o. for now. Analgesics as needed. Antiemetics as needed. Pantoprazole 40 mg  IVP  daily. Follow CBC, CMP and lipase in AM.  Active Problems:   LFTs abnormal GI has been consulted. Hold simvastatin.   Follow-up LFTs in AM.    Esophagitis determined by endoscopy Pantoprazole 40 mg IVP daily. Switch to oral formulation once tolerating oral intake.    Hypokalemia Replacing. Follow potassium level.    Hypertension Continue telmisartan 80 mg p.o. daily or formulary equivalent. Monitor blood pressure, renal function and electrolytes.    Depression with anxiety Continue risperidone 1 mg p.o. bedtime. Continue sertraline 100 mg p.o. at bedtime. Continue venlafaxine 37.5 mg in the morning. Follow-up with PCP and behavioral health as an outpatient.    Hypercholesterolemia Holding statin due to abnormal LFTs.    COPD (chronic obstructive pulmonary disease) (HCC) Not using the Symbicort regularly. Smoking cessation advised. Nicotine replacement therapy as needed.    Glaucoma Continue Xalatan and Trusopt drops. Follow-up with ophthalmology as an outpatient.    Advance Care Planning:   Code Status: Full code.  Consults: Gastroenterology Stan Head, MD).  Family Communication:   Severity of Illness: The appropriate patient status for this patient is OBSERVATION. Observation status is judged to be reasonable and necessary in order to provide the required intensity of service to ensure the patient's safety. The patient's presenting symptoms, physical exam findings, and initial radiographic and laboratory data in the context of their medical condition is felt to place them at decreased risk for further clinical deterioration. Furthermore, it is anticipated that the patient will be medically stable for discharge from the hospital within 2 midnights of admission.   Author: Bobette Mo, MD 07/08/2022 8:29 AM  For on call review [email or web address].   This document was prepared using Dragon voice recognition software and may contain some unintended  transcription errors.

## 2022-07-08 NOTE — Consult Note (Signed)
Reason for Consult: Elevated bilirubin levels. Referring Physician: [facility] hospitalist  Stephanie Benton is an 71 y.o. female.  HPI: [name] is a 49 year old black female with multiple medical problems including hypertension, hyperlipidemia, chronic abdominal pain history of recurrent pancreatitis with lumbar spine spondylosis, anxiety with bipolar disorder, COPD secondary to tobacco abuse who presented to the emergency room after multiple episodes of emesis that seem to occur after she ate some [place] fries on Thursday last week. In the emergency room she had a CT scan of the abdomen pelvis that showed cystitis versus bladder distention along with a heart large hiatal hernia where two thirds of the stomach was in the thorax along with aortic and coronary artery atherosclerosis. Also noted was by pelvic floor laxity in the lower urethral insertion but no cystocele small umbilical and intraumbilical inguinal hernia were noted.  She admits to drinking heavily over 10 years ago but has been abstinent since then.  She also smoked 2 packs/day for over 30 years but quit about 4 months ago.  Past Medical History:  Diagnosis Date   Hyperlipidemia    Hypertension    Prolonged QT interval 06/21/2022   Past Surgical History:  Procedure Laterality Date   ABDOMINAL HYSTERECTOMY     APPENDECTOMY     BIOPSY  05/17/2022   Procedure: BIOPSY;  Surgeon: Jeani Hawking, MD;  Location: [facility] ENDOSCOPY;  Service: Gastroenterology;;   CHOLECYSTECTOMY N/A 05/22/2022   Procedure: LAPAROSCOPIC CHOLECYSTECTOMY WITH INTRAOPERATIVE CHOLANGIOGRAM;  Surgeon: Abigail Miyamoto, MD;  Location: [facility] OR;  Service: General;  Laterality: N/A;   ESOPHAGOGASTRODUODENOSCOPY (EGD) WITH PROPOFOL N/A 05/17/2022   Procedure: ESOPHAGOGASTRODUODENOSCOPY (EGD) WITH PROPOFOL;  Surgeon: Jeani Hawking, MD;  Location: [facility] ENDOSCOPY;  Service: Gastroenterology;  Laterality: N/A;   HERNIA REPAIR     TONSILLECTOMY     TUBAL LIGATION     Family  History  Problem Relation Age of Onset   Heart failure Mother    Cancer Father    Cancer Sister    Social History:  reports that she has been smoking cigarettes. She has been smoking an average of .75 packs per day. She has never used smokeless tobacco. She reports that she does not drink alcohol and does not use drugs.  Allergies:  Allergies  Allergen Reactions   Fish-Derived Products Anaphylaxis   Morphine And Related Anaphylaxis and Swelling   Belsomra [Suvorexant] Other (See Comments)    Caused nightmares   Codeine Nausea And Vomiting   Fentanyl Hives   Ibuprofen Nausea Only and Other (See Comments)    Severe nausea- history of stomach ulcers   Nsaids Other (See Comments)    History of stomach ulcers   Other Other (See Comments)    History of stomach ulcers   Penicillins Hives    Did it involve swelling of the face/tongue/throat, SOB, or low BP? Yes Did it involve sudden or severe rash/hives, skin peeling, or any reaction on the inside of your mouth or nose? Yes Did you need to seek medical attention at a hospital or doctor's office? Yes When did it last happen? More than 10 years ago If all above answers are "NO", may proceed with cephalosporin use.    Sulfa Antibiotics Swelling    Site of swelling not recalled   Tolmetin Other (See Comments)    History of stomach ulcers   Medications: I have reviewed the patient's current medications. Prior to Admission: (Not in a hospital admission)  Scheduled:  dorzolamide  1 drop Both Eyes  BID   fesoterodine  4 mg Oral Daily   irbesartan  300 mg Oral Daily   latanoprost  1 drop Both Eyes QHS   pantoprazole (PROTONIX) IV  40 mg Intravenous Q24H   risperiDONE  1 mg Oral QHS   sertraline  100 mg Oral QHS   [START ON 07/09/2022] sucralfate  1 g Oral BID   [START ON 07/09/2022] venlafaxine XR  37.5 mg Oral Q breakfast   Continuous:  0.9 % NaCl with KCl 40 mEq / L     [id number] **OR** acetaminophen, HYDROmorphone (DILAUDID)  injection, ondansetron **OR** ondansetron (ZOFRAN) IV, prochlorperazine  Results for orders placed or performed during the hospital encounter of 07/07/22 (from the past 48 hour(s))  Comprehensive metabolic panel     Status: Abnormal   Collection Time: 07/07/22 10:32 PM  Result Value Ref Range   Sodium 138 135 - 145 mmol/L   Potassium 2.7 (LL) 3.5 - 5.1 mmol/L    Comment: CRITICAL RESULT CALLED TO, READ BACK BY AND VERIFIED WITH LYNCH,J. 07/07/22 @2310  BY SEEL,M.    Chloride 103 98 - 111 mmol/L   CO2 19 (L) 22 - 32 mmol/L   Glucose, Bld 68 (L) 70 - 99 mg/dL    Comment: Glucose reference range applies only to samples taken after fasting for at least 8 hours.   BUN 6 (L) 8 - 23 mg/dL   Creatinine, Ser 0.44 - 1.00 mg/dL   Calcium 8.9 8.9 - 1.47 mg/dL   Total Protein 6.0 (L) 6.5 - 8.1 g/dL   Albumin 2.9 (L) 3.5 - 5.0 g/dL   AST 14 (L) 15 - 41 U/L   ALT 9 0 - 44 U/L   Alkaline Phosphatase 86 38 - 126 U/L   Total Bilirubin 3.1 (H) 0.3 - 1.2 mg/dL   GFR, Estimated 82.9 >56 mL/min    Comment: (NOTE) Calculated using the CKD-EPI Creatinine Equation (2021)    Anion gap 16 (H) 5 - 15    Comment: Performed at [facility], [address], [place], [place]  Lipase, blood     Status: Abnormal   Collection Time: 07/07/22 10:32 PM  Result Value Ref Range   Lipase 92 (H) 11 - 51 U/L    Comment: Performed at [facility], [address], [place], [place]  CBC with Diff     Status: Abnormal   Collection Time: 07/07/22 10:32 PM  Result Value Ref Range   WBC 5.7 4.0 - 10.5 K/uL   RBC 3.87 3.87 - 5.11 MIL/uL   Hemoglobin 10.2 (L) 12.0 - 15.0 g/dL   HCT 14/03/23 (L) 62.9 - 52.8 %   MCV 81.7 80.0 - 100.0 fL   MCH 26.4 26.0 - 34.0 pg   MCHC 32.3 30.0 - 36.0 g/dL   RDW 41.3 (H) 24.4 - 01.0 %   Platelets 277 150 - 400 K/uL   nRBC 0.4 (H) 0.0 - 0.2 %   Neutrophils Relative % 69 %   Neutro Abs 4.0 1.7 - 7.7 K/uL   Lymphocytes Relative 19 %    Lymphs Abs 1.1 0.7 - 4.0 K/uL   Monocytes Relative 9 %   Monocytes Absolute 0.5 0.1 - 1.0 K/uL   Eosinophils Relative 1 %   Eosinophils Absolute 0.0 0.0 - 0.5 K/uL   Basophils Relative 1 %   Basophils Absolute 0.0 0.0 - 0.1 K/uL   Immature Granulocytes 1 %   Abs Immature Granulocytes 0.03 0.00 -  0.07 K/uL    Comment: Performed at [facility], [address] [place], [place] [address]  Magnesium     Status: None   Collection Time: 07/07/22 10:32 PM  Result Value Ref Range   Magnesium 2.0 1.7 - 2.4 mg/dL    Comment: Performed at [facility], [address] [place], [place] [address]  CBG monitoring, ED     Status: None   Collection Time: 07/08/22  3:52 AM  Result Value Ref Range   Glucose-Capillary 87 70 - 99 mg/dL    Comment: Glucose reference range applies only to samples taken after fasting for at least 8 hours.    CT ABDOMEN PELVIS W CONTRAST  Result Date: 07/08/2022 CLINICAL DATA:  Abdominal pain, nausea and vomiting. EXAM: CT ABDOMEN AND PELVIS WITH CONTRAST TECHNIQUE: Multidetector CT imaging of the abdomen and pelvis was performed using the standard protocol following bolus administration of intravenous contrast. RADIATION DOSE REDUCTION: This exam was performed according to the departmental dose-optimization program which includes automated exposure control, adjustment of the mA and/or kV according to patient size and/or use of iterative reconstruction technique. CONTRAST:  OMNIPAQUE IOHEXOL 300 MG/ML  SOLN COMPARISON:  CT with IV contrast 06/30/2022, MRI abdomen 05/19/2022. FINDINGS: Lower chest: Lung bases are clear. There is a large hiatal hernia. Small posterior diaphragmatic fat herniation The cardiac size is normal. There are scattered coronary artery calcifications. Hepatobiliary: Cholecystectomy again noted without bile duct dilatation or biloma. Liver is mildly steatotic without mass enhancement. Pancreas: No abnormality.  Spleen: No abnormality. Adrenals/Urinary Tract: Adrenal glands are unremarkable. Kidneys are normal, without renal calculi, focal lesion, or hydronephrosis. There is mild bladder thickening versus changes of underdistention. Bilateral low ureteral insertions consistent with pelvic floor laxity, without cystocele. Stomach/Bowel: Large hiatal hernia. About 2/3 of the stomach is intrathoracic. There is no small bowel obstruction or inflammation. An appendix is not seen in this patient. There is no dilatation or wall thickening of the colon. Vascular/Lymphatic: Aortic atherosclerosis. No enlarged abdominal or pelvic lymph nodes. Reproductive: Status post hysterectomy. No adnexal masses. Multiple pelvic phleboliths. Other: There are small midline umbilical and infraumbilical fat hernias. Small inguinal fat hernias. There is no incarcerated hernia. Stable 6.7 x 4.9 cm ovoid chronic fluid collection again noted associated with a ventral mesh hernia repair. Rim calcified focal fat necrosis at this level is seen subcutaneously right of the midline measuring 2.9 cm. There is no free air, free fluid or free hemorrhage. There are no acute inflammatory changes. Musculoskeletal: There is osteopenia and degenerative change of the spine, mild hip DJD. At L5-S1, advanced facet hypertrophy associated with grade 1 spondylolisthesis and moderate disc space loss. There is acquired spinal stenosis at this level. IMPRESSION: 1. Cystitis versus bladder nondistention. 2. Large hiatal hernia. 3. Aortic and coronary artery atherosclerosis. 4. 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 5. Small umbilical, infraumbilical and inguinal fat hernias. 6. Stable 6.7 x 4.9 cm chronic fluid collection associated with a ventral mesh hernia repair. 7. Osteopenia and degenerative change. Aortic Atherosclerosis (ICD10-I70.0). Electronically Signed   By: Almira Bar M.D.   On: 07/08/2022 04:54    Review of Systems   Constitutional:  Negative for activity change, appetite change, chills, diaphoresis, fatigue, fever and unexpected weight change.  Eyes: Negative.   Respiratory: Negative.    Cardiovascular: Negative.   Gastrointestinal:  Positive for abdominal pain, constipation, nausea and vomiting. Negative for abdominal distention, anal bleeding, blood in stool, diarrhea and  rectal pain.  Endocrine: Negative.   Genitourinary: Negative.   Musculoskeletal:  Positive for arthralgias.  Skin: Negative.   Neurological: Negative.   Hematological: Negative.    Blood pressure (!) 148/77, pulse 67, temperature 98.1 F (36.7 C), temperature source Oral, resp. rate 15, height 5\' 5"  (1.651 m), weight 81.6 kg, SpO2 100 %. Physical Exam Vitals reviewed.  Constitutional:      Appearance: She is obese.     Comments: Patient seems to be resting comfortably but on palpation of her abdomen, the abdominal pain seems to be out of proportion to her symptoms.  HENT:     Head: Normocephalic and atraumatic.  Cardiovascular:     Rate and Rhythm: Normal rate and regular rhythm.  Pulmonary:     Effort: Pulmonary effort is normal.     Breath sounds: Normal breath sounds.  Abdominal:     General: Bowel sounds are normal. There is no distension.     Palpations: Abdomen is soft.     Tenderness: There is abdominal tenderness in the epigastric area and periumbilical area.  Skin:    General: Skin is warm and dry.  Neurological:     General: No focal deficit present.     Mental Status: She is alert.  Psychiatric:        Mood and Affect: Mood normal.   Assessment/Plan: 1) Epigastric and periumbilical pain/large hiatal hernia/esophagitis on EGD on PPI with hyperbilrubinemia-TB of 3.1-would recommend a repeat liver panel with indirect bilirubin levels to rule out Gilbert's syndrome; there is no evidence of pancreatitis on CT scan done on admission. Small posterior diaphragmatic fat herniation is noted along with small umbilical  hernia the midline along with infraumbilical fat hernias and small inguinal fat hernias with no evidence of incarceration of the hernias.  There is a stable 6.7 x 4.9 cm chronic fluid collection associate with a ventral hernia repair with mesh. The lipase level is 92.  Not exactly sure as to why the patient has had severe abdominal pain on palpation. She seems comfortable pain-free during the interview.  2) Hypertension/hyperlipidemia 3) Depression and anxiety disorder; history of alcohol abuse over 10 years ago but patient has been abstinent since then. 4) COPD with longstanding history of tobacco abuse over 60 pack years. She stopped smoking 4 months ago. 5) Hypokalemia after multiple bouts of emesis; potassium is being replaced.  Charna Elizabeth 07/08/2022, 4:59 PM

## 2022-07-08 NOTE — ED Provider Notes (Signed)
[facility]-EMERGENCY DEPT Provider Note   CSN: [id number] Arrival date & time: 07/07/22  2203     History  Chief Complaint  Patient presents with   Abdominal Pain    JAYELYN BARNO is a 71 y.o. female.  71 year old female presents today for evaluation of abdominal pain, nausea, vomiting.  She states that nausea vomiting has been ongoing for a month but worsened in the past 3 days.  Last bowel movement was 2 days ago.  Denies fever, chills.  Endorses cholecystectomy 2 months ago.  Initially tachycardic to 150s during my evaluation.  However quickly improved to about 110.  Denies history of arrhythmia, or other heart disease.  Recent echo reviewed shows preserved EF with normal LV function.  Family presents later adds additional history.  They state this has been an ongoing issue for several months.  She comes to the emergency room pelvis on the likely basis and gets admitted.  They state patient is unable to tolerate keeping her medications down, has not had p.o. intake in the past couple days.  The history is provided by the patient. No language interpreter was used.       Home Medications Prior to Admission medications   Medication Sig Start Date End Date Taking? Authorizing Provider  albuterol (PROVENTIL) (2.5 MG/3ML) 0.083% nebulizer solution Take 3 mLs (2.5 mg total) by nebulization every 6 (six) hours as needed for wheezing or shortness of breath. 11/27/14   Elson Areas, PA-C  albuterol (VENTOLIN HFA) 108 (90 Base) MCG/ACT inhaler Inhale 2 puffs into the lungs every 6 (six) hours as needed for wheezing or shortness of breath.    [provider]  dexlansoprazole (DEXILANT) 60 MG capsule Take 1 capsule by mouth daily. 06/14/22   [provider]  diclofenac sodium (VOLTAREN) 1 % GEL Apply 2 g topically daily as needed (for knee pain). 12/04/17   [provider]  dorzolamide (TRUSOPT) 2 % ophthalmic solution Place 1 drop into both  eyes 2 (two) times daily.    [provider]  DULoxetine 40 MG CPEP Take 40 mg by mouth daily. 06/25/22 06/25/23  Rolly Salter, MD  feeding supplement (ENSURE ENLIVE / ENSURE PLUS) LIQD Take 237 mLs by mouth 2 (two) times daily between meals. 06/25/22   Rolly Salter, MD  FEROSUL 325 (65 Fe) MG tablet Take 325 mg by mouth daily with breakfast. 01/24/22   [provider]  latanoprost (XALATAN) 0.005 % ophthalmic solution Place 1 drop into both eyes at bedtime. 02/16/18   [provider]  nicotine (NICODERM CQ - DOSED IN MG/24 HOURS) 21 mg/24hr patch Place 1 patch (21 mg total) onto the skin daily. Patient not taking: Reported on 10/11/2019 02/19/18   Dhungel, Theda Belfast, MD  ondansetron (ZOFRAN) 4 MG tablet Take 1 tablet (4 mg total) by mouth every 6 (six) hours. 06/30/22   Elayne Snare K, DO  ondansetron (ZOFRAN-ODT) 4 MG disintegrating tablet Take 4 mg by mouth every 8 (eight) hours as needed for nausea or vomiting. 06/13/22   [provider]  polyethylene glycol powder (GLYCOLAX/MIRALAX) 17 GM/SCOOP powder Take 1 cap full (17 g) with water by mouth daily. Patient taking differently: Take 17 g by mouth daily as needed for mild constipation. 05/24/22   Zigmund Daniel., MD  Potassium Chloride 10 MEQ PACK Take 10 mEq by mouth daily for 7 days. 06/30/22 07/07/22  Elayne Snare K, DO  pregabalin (LYRICA) 100 MG capsule Take 1 capsule (  100 mg total) by mouth 3 (three) times daily. 06/25/22   Rolly Salter, MD  risperiDONE (RISPERDAL) 1 MG tablet Take 1 mg by mouth at bedtime.  11/24/14   [provider]  sertraline (ZOLOFT) 100 MG tablet Take 100 mg by mouth at bedtime.  05/05/13   [provider]  simvastatin (ZOCOR) 40 MG tablet Take 40 mg by mouth at bedtime.  05/09/15   [provider]  sucralfate (CARAFATE) 1 GM/10ML suspension Take 10 mLs (1 g total) by mouth 2 (two) times daily. 06/25/22 07/25/22  Rolly Salter, MD   SYMBICORT 160-4.5 MCG/ACT inhaler Inhale 1 puff into the lungs 2 (two) times daily as needed (for flares).  01/28/18   [provider]  telmisartan (MICARDIS) 80 MG tablet Take 80 mg by mouth at bedtime.  06/24/18   [provider]  tolterodine (DETROL LA) 2 MG 24 hr capsule Take 2 mg by mouth daily. 05/15/22   [provider]  torsemide (DEMADEX) 10 MG tablet Take 10 mg by mouth daily as needed (for swelling in legs). 05/06/22   [provider]  traMADol (ULTRAM) 50 MG tablet Take 1 tablet (50 mg total) by mouth every 8 (eight) hours as needed for severe pain or moderate pain. 06/25/22   Rolly Salter, MD      Allergies    Fish-derived products, Morphine and related, Belsomra [suvorexant], Codeine, Fentanyl, Ibuprofen, Nsaids, Other, Penicillins, Sulfa antibiotics, and Tolmetin    Review of Systems   Review of Systems  Respiratory:  Negative for shortness of breath.   Cardiovascular:  Negative for chest pain and palpitations.  Gastrointestinal:  Positive for abdominal pain, nausea and vomiting. Negative for constipation and diarrhea.  Genitourinary:  Negative for dysuria.    Physical Exam Updated Vital Signs BP (!) 149/81 (BP Location: Right Arm)   Pulse (!) 106   Temp 98.4 F (36.9 C) (Oral)   Resp 18   Ht 5\' 5"  (1.651 m)   Wt 81.6 kg   SpO2 100%   BMI 29.95 kg/m  Physical Exam Vitals and nursing note reviewed.  Constitutional:      General: She is not in acute distress.    Appearance: Normal appearance. She is not ill-appearing.  HENT:     Head: Normocephalic and atraumatic.     Nose: Nose normal.  Eyes:     General: No scleral icterus.    Extraocular Movements: Extraocular movements intact.     Conjunctiva/sclera: Conjunctivae normal.  Cardiovascular:     Rate and Rhythm: Normal rate and regular rhythm.     Pulses: Normal pulses.  Pulmonary:     Effort: Pulmonary effort is normal. No respiratory distress.     Breath sounds:  Normal breath sounds. No wheezing or rales.  Abdominal:     General: There is no distension.     Palpations: Abdomen is soft.     Tenderness: There is abdominal tenderness (Generalized abdominal tenderness). There is no right CVA tenderness, left CVA tenderness, guarding or rebound.  Musculoskeletal:        General: Normal range of motion.     Cervical back: Normal range of motion.     Right lower leg: No edema.     Left lower leg: No edema.  Skin:    General: Skin is warm and dry.  Neurological:     General: No focal deficit present.     Mental Status: She is alert. Mental status is at baseline.  ED Results / Procedures / Treatments   Labs (all labs ordered are listed, but only abnormal results are displayed) Labs Reviewed  COMPREHENSIVE METABOLIC PANEL - Abnormal; Notable for the following components:      Result Value   Potassium 2.7 (*)    CO2 19 (*)    Glucose, Bld 68 (*)    BUN 6 (*)    Total Protein 6.0 (*)    Albumin 2.9 (*)    AST 14 (*)    Total Bilirubin 3.1 (*)    Anion gap 16 (*)    All other components within normal limits  LIPASE, BLOOD - Abnormal; Notable for the following components:   Lipase 92 (*)    All other components within normal limits  CBC WITH DIFFERENTIAL/PLATELET - Abnormal; Notable for the following components:   Hemoglobin 10.2 (*)    HCT 31.6 (*)    RDW 16.3 (*)    nRBC 0.4 (*)    All other components within normal limits  URINALYSIS, ROUTINE W REFLEX MICROSCOPIC  MAGNESIUM  CBG MONITORING, ED    EKG None  Radiology No results found.  Procedures Procedures    Medications Ordered in ED Medications  potassium chloride 10 mEq in 100 mL IVPB (has no administration in time range)  potassium chloride SA (KLOR-CON M) CR tablet 40 mEq (has no administration in time range)    ED Course/ Medical Decision Making/ A&P                           Medical Decision Making Amount and/or Complexity of Data Reviewed Labs:  ordered. Radiology: ordered.  Risk OTC drugs. Prescription drug management.   Medical Decision Making / ED Course   This patient presents to the ED for concern of abdominal pain, vomiting, this involves an extensive number of treatment options, and is a complaint that carries with it a high risk of complications and morbidity.  The differential diagnosis includes pancreatitis, appendicitis, gastroenteritis, gastritis, diverticulitis, ACS  MDM: 71 year old female presents today for evaluation of above-mentioned complaints.  She does appear chronically ill.  She has an emesis bag during my interview and is spitting up.  Generalized abdominal tenderness to palpation present.  Denies chest pain, shortness of breath.  Briefly her rates increased to about 150 during my interview but improved without intervention to about 110.  During this episode she denies chest pain, shortness of breath, palpitations, lightheadedness.  This appears to be a ongoing issue for her and she in the past has required admission for intractable abdominal pain and vomiting.  Family is concerned regarding ongoing symptoms.  They states she has not been able to tolerate much of any p.o. intake including her medications.  We will give a dose of Dilaudid, Zofran, and fluid bolus and reevaluate.  Will give IV potassium repletion.  Potassium 2.7.  CMP otherwise without acute concerns.  T. bili is increased at 3.1, LFTs otherwise normal.  Lipase elevated to 92.  Magnesium at 2.0.  CT abdomen pelvis without acute intra-abdominal etiology of patient's symptoms.  Patient at the end of my shift also complaining of some heartburn.  Given the initial EKG had artifact will have this repeated.  Will give GI cocktail.  Patient shift is awaiting reevaluation.  If she is without improvement in her symptoms she may require admission for intractable abdominal pain and vomiting.  Patient signed out to oncoming provider.  Lab Tests: -I ordered,  reviewed,  and interpreted labs.   The pertinent results include:   Labs Reviewed  COMPREHENSIVE METABOLIC PANEL - Abnormal; Notable for the following components:      Result Value   Potassium 2.7 (*)    CO2 19 (*)    Glucose, Bld 68 (*)    BUN 6 (*)    Total Protein 6.0 (*)    Albumin 2.9 (*)    AST 14 (*)    Total Bilirubin 3.1 (*)    Anion gap 16 (*)    All other components within normal limits  LIPASE, BLOOD - Abnormal; Notable for the following components:   Lipase 92 (*)    All other components within normal limits  CBC WITH DIFFERENTIAL/PLATELET - Abnormal; Notable for the following components:   Hemoglobin 10.2 (*)    HCT 31.6 (*)    RDW 16.3 (*)    nRBC 0.4 (*)    All other components within normal limits  MAGNESIUM  URINALYSIS, ROUTINE W REFLEX MICROSCOPIC  CBG MONITORING, ED      EKG  EKG Interpretation  Date/Time:  Monday July 08 2022 04:04:27 EST Ventricular Rate:  94 PR Interval:  198 QRS Duration: 84 QT Interval:  333 QTC Calculation: 417 R Axis:   14 Text Interpretation: Sinus rhythm Ventricular premature complex Left atrial enlargement Anterior infarct, old Borderline T abnormalities, inferior leads Artifact in lead(s) I II III aVR aVL aVF V1 V2 V3 V4 V5 V6 Confirmed by Tilden Fossa [phone]) on 07/08/2022 4:08:31 AM         Imaging Studies ordered: I ordered imaging studies including CT abdomen pelvis with contrast I independently visualized and interpreted imaging. I agree with the radiologist interpretation   Medicines ordered and prescription drug management: Meds ordered this encounter  Medications   potassium chloride 10 mEq in 100 mL IVPB   potassium chloride SA (KLOR-CON M) CR tablet 40 mEq   iohexol (OMNIPAQUE) 300 MG/ML solution 100 mL   ondansetron (ZOFRAN) injection 4 mg   HYDROmorphone (DILAUDID) injection 1 mg   AND Linked Order Group    alum & mag hydroxide-simeth (MAALOX/MYLANTA) 200-200-20 MG/5ML suspension 30 mL     lidocaine (XYLOCAINE) 2 % viscous mouth solution 15 mL    -I have reviewed the patients home medicines and have made adjustments as needed  Cardiac Monitoring: The patient was maintained on a cardiac monitor.  I personally viewed and interpreted the cardiac monitored which showed an underlying rhythm of: Sinus tachycardia  Reevaluation: After the interventions noted above, I reevaluated the patient and found that they have :stayed the same  Co morbidities that complicate the patient evaluation  Past Medical History:  Diagnosis Date   Hyperlipidemia    Hypertension       Dispostion: Patient at the end of my shift signed out to oncoming provider.  Final Clinical Impression(s) / ED Diagnoses Final diagnoses:  None    Rx / DC Orders ED Discharge Orders     None         Marita Kansas, PA-C 07/08/22 [id number]    Tilden Fossa, MD 07/08/22 [phone]

## 2022-07-09 ENCOUNTER — Encounter (HOSPITAL_COMMUNITY): Payer: Self-pay

## 2022-07-09 ENCOUNTER — Other Ambulatory Visit: Payer: Self-pay

## 2022-07-09 DIAGNOSIS — F418 Other specified anxiety disorders: Secondary | ICD-10-CM | POA: Diagnosis not present

## 2022-07-09 DIAGNOSIS — J449 Chronic obstructive pulmonary disease, unspecified: Secondary | ICD-10-CM

## 2022-07-09 DIAGNOSIS — E876 Hypokalemia: Secondary | ICD-10-CM | POA: Diagnosis not present

## 2022-07-09 DIAGNOSIS — I10 Essential (primary) hypertension: Secondary | ICD-10-CM

## 2022-07-09 DIAGNOSIS — K859 Acute pancreatitis without necrosis or infection, unspecified: Secondary | ICD-10-CM | POA: Diagnosis not present

## 2022-07-09 LAB — BASIC METABOLIC PANEL
Anion gap: 10 (ref 5–15)
BUN: 8 mg/dL (ref 8–23)
CO2: 21 mmol/L — ABNORMAL LOW (ref 22–32)
Calcium: 8.6 mg/dL — ABNORMAL LOW (ref 8.9–10.3)
Chloride: 110 mmol/L (ref 98–111)
Creatinine, Ser: 0.75 mg/dL (ref 0.44–1.00)
GFR, Estimated: >60 mL/min (ref >60)
Glucose, Bld: 73 mg/dL (ref 70–99)
Potassium: 2.9 mmol/L — ABNORMAL LOW (ref 3.5–5.1)
Sodium: 141 mmol/L (ref 135–145)

## 2022-07-09 LAB — IRON AND TIBC
Iron: 80 ug/dL (ref 28–170)
Saturation Ratios: 50 % — ABNORMAL HIGH (ref 10.4–31.8)
TIBC: 160 ug/dL — ABNORMAL LOW (ref 250–450)
UIBC: 80 ug/dL

## 2022-07-09 LAB — LIPASE, BLOOD: Lipase: 480 U/L — ABNORMAL HIGH (ref 11–51)

## 2022-07-09 LAB — CBC
HCT: 27.2 % — ABNORMAL LOW (ref 36.0–46.0)
Hemoglobin: 8.9 g/dL — ABNORMAL LOW (ref 12.0–15.0)
MCH: 26.5 pg (ref 26.0–34.0)
MCHC: 32.7 g/dL (ref 30.0–36.0)
MCV: 81 fL (ref 80.0–100.0)
Platelets: 273 10*3/uL (ref 150–400)
RBC: 3.36 MIL/uL — ABNORMAL LOW (ref 3.87–5.11)
RDW: 16.5 % — ABNORMAL HIGH (ref 11.5–15.5)
WBC: 6.1 10*3/uL (ref 4.0–10.5)
nRBC: 0.3 % — ABNORMAL HIGH (ref 0.0–0.2)

## 2022-07-09 LAB — FOLATE: Folate: 5.9 ng/mL — ABNORMAL LOW (ref >5.9)

## 2022-07-09 LAB — VITAMIN B12: Vitamin B-12: 1104 pg/mL — ABNORMAL HIGH (ref 180–914)

## 2022-07-09 LAB — FERRITIN: Ferritin: 269 ng/mL (ref 11–307)

## 2022-07-09 LAB — HEPATIC FUNCTION PANEL
ALT: 8 U/L (ref 0–44)
AST: 13 U/L — ABNORMAL LOW (ref 15–41)
Albumin: 2.7 g/dL — ABNORMAL LOW (ref 3.5–5.0)
Alkaline Phosphatase: 83 U/L (ref 38–126)
Bilirubin, Direct: 0.8 mg/dL — ABNORMAL HIGH (ref 0.0–0.2)
Indirect Bilirubin: 1.3 mg/dL — ABNORMAL HIGH (ref 0.3–0.9)
Total Bilirubin: 2.1 mg/dL — ABNORMAL HIGH (ref 0.3–1.2)
Total Protein: 5.9 g/dL — ABNORMAL LOW (ref 6.5–8.1)

## 2022-07-09 MED ORDER — POTASSIUM CHLORIDE IN NACL 40-0.9 MEQ/L-% IV SOLN
INTRAVENOUS | Status: AC
  Filled 2022-07-09 (×3): qty 1000

## 2022-07-09 MED ORDER — POTASSIUM CHLORIDE CRYS ER 20 MEQ PO TBCR
40.0000 meq | EXTENDED_RELEASE_TABLET | Freq: Two times a day (BID) | ORAL | Status: DC
  Administered 2022-07-09 – 2022-07-13 (×8): 40 meq via ORAL
  Filled 2022-07-09 (×8): qty 2

## 2022-07-09 MED ORDER — POTASSIUM CHLORIDE 10 MEQ/100ML IV SOLN
10.0000 meq | INTRAVENOUS | Status: AC
  Administered 2022-07-09 (×2): 10 meq via INTRAVENOUS
  Filled 2022-07-09 (×2): qty 100

## 2022-07-09 MED ORDER — ORAL CARE MOUTH RINSE: 15.0000 mL | OROMUCOSAL | Status: DC | PRN

## 2022-07-09 MED ORDER — PANTOPRAZOLE SODIUM 40 MG IV SOLR
40.0000 mg | Freq: Two times a day (BID) | INTRAVENOUS | Status: DC
  Administered 2022-07-09 – 2022-07-13 (×7): 40 mg via INTRAVENOUS
  Filled 2022-07-09 (×7): qty 10

## 2022-07-09 NOTE — Progress Notes (Signed)
Subjective: No acute events.  Feeling better, mildly.  Objective: Vital signs in last 24 hours: Temp:  [98.1 F (36.7 C)-98.4 F (36.9 C)] 98.3 F (36.8 C) (12/05 1237) Pulse Rate:  [70-89] 72 (12/05 1200) Resp:  [11-15] 15 (12/05 1200) BP: (128-150)/(68-89) 132/68 (12/05 1200) SpO2:  [98 %-100 %] 99 % (12/05 1200)    Intake/Output from previous day: No intake/output data recorded. Intake/Output this shift: No intake/output data recorded.  General appearance: alert and no distress GI: diffusely tender , but mostly in the epigastric region  Lab Results: Recent Labs    07/07/22 2232 07/09/22 0513  WBC 5.7 6.1  HGB 10.2* 8.9*  HCT 31.6* 27.2*  PLT 277 273   BMET Recent Labs    07/07/22 2232 07/09/22 0513  NA 138 141  K 2.7* 2.9*  CL 103 110  CO2 19* 21*  GLUCOSE 68* 73  BUN 6* 8  CREATININE 0.80 0.75  CALCIUM 8.9 8.6*   LFT Recent Labs    07/09/22 0513  PROT 5.9*  ALBUMIN 2.7*  AST 13*  ALT 8  ALKPHOS 83  BILITOT 2.1*  BILIDIR 0.8*  IBILI 1.3*   PT/INR No results for input(s): "LABPROT", "INR" in the last 72 hours. Hepatitis Panel No results for input(s): "HEPBSAG", "HCVAB", "HEPAIGM", "HEPBIGM" in the last 72 hours. C-Diff No results for input(s): "CDIFFTOX" in the last 72 hours. Fecal Lactopherrin No results for input(s): "FECLLACTOFRN" in the last 72 hours.  Studies/Results: CT ABDOMEN PELVIS W CONTRAST  Result Date: 07/08/2022 CLINICAL DATA:  Abdominal pain, nausea and vomiting. EXAM: CT ABDOMEN AND PELVIS WITH CONTRAST TECHNIQUE: Multidetector CT imaging of the abdomen and pelvis was performed using the standard protocol following bolus administration of intravenous contrast. RADIATION DOSE REDUCTION: This exam was performed according to the departmental dose-optimization program which includes automated exposure control, adjustment of the mA and/or kV according to patient size and/or use of iterative reconstruction technique. CONTRAST:   OMNIPAQUE IOHEXOL 300 MG/ML  SOLN COMPARISON:  CT with IV contrast 06/30/2022, MRI abdomen 05/19/2022. FINDINGS: Lower chest: Lung bases are clear. There is a large hiatal hernia. Small posterior diaphragmatic fat herniation The cardiac size is normal. There are scattered coronary artery calcifications. Hepatobiliary: Cholecystectomy again noted without bile duct dilatation or biloma. Liver is mildly steatotic without mass enhancement. Pancreas: No abnormality. Spleen: No abnormality. Adrenals/Urinary Tract: Adrenal glands are unremarkable. Kidneys are normal, without renal calculi, focal lesion, or hydronephrosis. There is mild bladder thickening versus changes of underdistention. Bilateral low ureteral insertions consistent with pelvic floor laxity, without cystocele. Stomach/Bowel: Large hiatal hernia. About 2/3 of the stomach is intrathoracic. There is no small bowel obstruction or inflammation. An appendix is not seen in this patient. There is no dilatation or wall thickening of the colon. Vascular/Lymphatic: Aortic atherosclerosis. No enlarged abdominal or pelvic lymph nodes. Reproductive: Status post hysterectomy. No adnexal masses. Multiple pelvic phleboliths. Other: There are small midline umbilical and infraumbilical fat hernias. Small inguinal fat hernias. There is no incarcerated hernia. Stable 6.7 x 4.9 cm ovoid chronic fluid collection again noted associated with a ventral mesh hernia repair. Rim calcified focal fat necrosis at this level is seen subcutaneously right of the midline measuring 2.9 cm. There is no free air, free fluid or free hemorrhage. There are no acute inflammatory changes. Musculoskeletal: There is osteopenia and degenerative change of the spine, mild hip DJD. At L5-S1, advanced facet hypertrophy associated with grade 1 spondylolisthesis and moderate disc space loss. There is acquired spinal  stenosis at this level. IMPRESSION: 1. Cystitis versus bladder nondistention. 2. Large hiatal  hernia. 3. Aortic and coronary artery atherosclerosis. 4. 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 5. Small umbilical, infraumbilical and inguinal fat hernias. 6. Stable 6.7 x 4.9 cm chronic fluid collection associated with a ventral mesh hernia repair. 7. Osteopenia and degenerative change. Aortic Atherosclerosis (ICD10-I70.0). Electronically Signed   By: Telford Nab M.D.   On: 07/08/2022 04:54    Medications: Scheduled:  dorzolamide  1 drop Both Eyes BID   fesoterodine  4 mg Oral Daily   irbesartan  300 mg Oral Daily   latanoprost  1 drop Both Eyes QHS   pantoprazole (PROTONIX) IV  40 mg Intravenous Q24H   potassium chloride  40 mEq Oral BID   risperiDONE  1 mg Oral QHS   sertraline  100 mg Oral QHS   sucralfate  1 g Oral BID   venlafaxine XR  37.5 mg Oral Daily   Continuous:  0.9 % NaCl with KCl 40 mEq / L     potassium chloride      Assessment/Plan: 1) Acute pancreatitis. 2) Hyperbilirubinemia - Mild unconjugated predominance (1.3 and 0.8). 3) Hypokalemia. 4) Anemia - near baseline.    There is some mild improvement in her pain with Dilaudid.  The lipase was markedly elevated.  She has a history of an anemia and her current value today is not far off from her baseline.  Plan: 1) Continue with pain control. 2) Continue with IV hydration. 3) Replete potassium. 4) Trend her anemia.  LOS: 1 day   Stephanie Benton D 07/09/2022, 1:44 PM

## 2022-07-09 NOTE — Progress Notes (Signed)
[facility]                                                                               [place], is a 71 y.o. female, DOB - May 13, 1951, VEL:[id number] Admit date - 07/07/2022    Outpatient Primary MD for the patient is Paliwal, Himanshu, MD  LOS - 1  days    Brief summary    Stephanie Benton is a 71 y.o. female with medical history significant of hyperlipidemia, recurrent pancreatitis, chronic abdominal pain, lumbar spine spondylosis, metabolic acidosis, hyperkalemia, hypertension, hyperlipidemia depression with anxiety, bipolar 1 disorder, B12 deficiency, COPD, tobacco use who is coming to the emergency department due to abdominal pain associated with multiple episodes of emesis since she ate some [place] fries on Thursday .  CT abdomen/pelvis with contrast shows cystitis versus bladder distention, with a large hiatal hernia. Stable 6.7 x 4.9 cm chronic fluid collection associated with the ventral mesh hernia repair.  She was admitted for acute on chronic pancreatitis.   Assessment & Plan    Assessment and Plan:  Acute on chronic pancreatitis: in the setting of chronic abdominal pain and recurrent pancreatitis.  Lipase elevated at 480 today with worsening pain in the epigastric area and left upper quadrant .  Continue with NPO except for ice chips and sips o water.  Continue with IV fluids and replace electrolytes.  CT abd reviewed. GI is on board.  No vomiting today.  Continue with IV PPI.    Hypokalemia;  Replaced.    Hypertension:  Bp parameters are optimal.    Hyperbilirubinemia:  Unclear etiology, improving.     Normocytic anemia:  Baseline hemoglobin appears to be around 14, in 05/2022, with slow decline to 12, to 10 to 8.9. today.  Stool for occult blood and anemia panel ordered.  Last EGD in 05/2022, showing esophagitis, gastritis and erythematous duodenopathy.  Increase IV PPI to BID. And continue with sucralfate.  Transfuse to keep  hemoglobin greater than 7.    Depression with anxiety:  Resume home meds.    COPD:  No wheezing heard on exam.  Nicotine replacement therapy as needed.   Estimated body mass index is 29.95 kg/m as calculated from the following:   Height as of this encounter: 5\' 5"  (1.651 m).   Weight as of this encounter: 81.6 kg.  Code Status: full code.  DVT Prophylaxis:  SCDs Start: 07/08/22 0839   Level of Care: Level of care: Telemetry Family Communication:none at bedside.   Disposition Plan:     Remains inpatient appropriate:  persistent abdominal pain.   Procedures:  None.   Consultants:   Gastroenterology.   Antimicrobials:   Anti-infectives (From admission, onward)    None        Medications  Scheduled Meds:  dorzolamide  1 drop Both Eyes BID   fesoterodine  4 mg Oral Daily   irbesartan  300 mg Oral Daily   latanoprost  1 drop Both Eyes QHS   pantoprazole (PROTONIX) IV  40 mg Intravenous Q24H   potassium chloride  40 mEq Oral BID   risperiDONE  1 mg Oral QHS   sertraline  100 mg Oral  QHS   sucralfate  1 g Oral BID   venlafaxine XR  37.5 mg Oral Daily   Continuous Infusions: PRN Meds:.acetaminophen **OR** acetaminophen, HYDROmorphone (DILAUDID) injection, ondansetron **OR** ondansetron (ZOFRAN) IV, prochlorperazine    Subjective:   Stephanie Benton was seen and examined today.  Some abdominal pain. No vomiting.   Objective:   Vitals:   07/09/22 1009 07/09/22 1100 07/09/22 1200 07/09/22 1237  BP:  138/81 132/68   Pulse:  71 72   Resp:  12 15   Temp: 98.2 F (36.8 C)   98.3 F (36.8 C)  TempSrc: Oral   Oral  SpO2:  98% 99%   Weight:      Height:       No intake or output data in the 24 hours ending 07/09/22 1327 Filed Weights   07/07/22 2209  Weight: 81.6 kg     Exam  General exam: Appears calm and comfortable  Respiratory system: Clear to auscultation. Respiratory effort normal. Cardiovascular system: S1 & S2 heard, RRR. No JVD,  No pedal  edema. Gastrointestinal system: Abdomen is soft , tender in the epigastric and left upper quadrant.  Central nervous system: Alert and oriented. No focal neurological deficits. Extremities: Symmetric 5 x 5 power. Skin: No rashes, lesions or ulcers Psychiatry: Mood & affect appropriate.    Data Reviewed:  I have personally reviewed following labs and imaging studies   CBC Lab Results  Component Value Date   WBC 6.1 07/09/2022   RBC 3.36 (L) 07/09/2022   HGB 8.9 (L) 07/09/2022   HCT 27.2 (L) 07/09/2022   MCV 81.0 07/09/2022   MCH 26.5 07/09/2022   PLT 273 07/09/2022   MCHC 32.7 07/09/2022   RDW 16.5 (H) 07/09/2022   LYMPHSABS 1.1 07/07/2022   MONOABS 0.5 07/07/2022   EOSABS 0.0 07/07/2022   BASOSABS 0.0 07/07/2022     Last metabolic panel Lab Results  Component Value Date   NA 141 07/09/2022   K 2.9 (L) 07/09/2022   CL 110 07/09/2022   CO2 21 (L) 07/09/2022   BUN 8 07/09/2022   CREATININE 0.75 07/09/2022   GLUCOSE 73 07/09/2022   GFRNONAA >60 07/09/2022   GFRAA >60 10/11/2019   CALCIUM 8.6 (L) 07/09/2022   PHOS 2.6 06/21/2022   PROT 5.9 (L) 07/09/2022   ALBUMIN 2.7 (L) 07/09/2022   BILITOT 2.1 (H) 07/09/2022   ALKPHOS 83 07/09/2022   AST 13 (L) 07/09/2022   ALT 8 07/09/2022   ANIONGAP 10 07/09/2022    CBG (last 3)  Recent Labs    07/08/22 0352  GLUCAP 87      Coagulation Profile: No results for input(s): "INR", "PROTIME" in the last 168 hours.   Radiology Studies: CT ABDOMEN PELVIS W CONTRAST  Result Date: 07/08/2022 CLINICAL DATA:  Abdominal pain, nausea and vomiting. EXAM: CT ABDOMEN AND PELVIS WITH CONTRAST TECHNIQUE: Multidetector CT imaging of the abdomen and pelvis was performed using the standard protocol following bolus administration of intravenous contrast. RADIATION DOSE REDUCTION: This exam was performed according to the departmental dose-optimization program which includes automated exposure control, adjustment of the mA and/or kV  according to patient size and/or use of iterative reconstruction technique. CONTRAST:  OMNIPAQUE IOHEXOL 300 MG/ML  SOLN COMPARISON:  CT with IV contrast 06/30/2022, MRI abdomen 05/19/2022. FINDINGS: Lower chest: Lung bases are clear. There is a large hiatal hernia. Small posterior diaphragmatic fat herniation The cardiac size is normal. There are scattered coronary artery calcifications. Hepatobiliary: Cholecystectomy again noted without  bile duct dilatation or biloma. Liver is mildly steatotic without mass enhancement. Pancreas: No abnormality. Spleen: No abnormality. Adrenals/Urinary Tract: Adrenal glands are unremarkable. Kidneys are normal, without renal calculi, focal lesion, or hydronephrosis. There is mild bladder thickening versus changes of underdistention. Bilateral low ureteral insertions consistent with pelvic floor laxity, without cystocele. Stomach/Bowel: Large hiatal hernia. About 2/3 of the stomach is intrathoracic. There is no small bowel obstruction or inflammation. An appendix is not seen in this patient. There is no dilatation or wall thickening of the colon. Vascular/Lymphatic: Aortic atherosclerosis. No enlarged abdominal or pelvic lymph nodes. Reproductive: Status post hysterectomy. No adnexal masses. Multiple pelvic phleboliths. Other: There are small midline umbilical and infraumbilical fat hernias. Small inguinal fat hernias. There is no incarcerated hernia. Stable 6.7 x 4.9 cm ovoid chronic fluid collection again noted associated with a ventral mesh hernia repair. Rim calcified focal fat necrosis at this level is seen subcutaneously right of the midline measuring 2.9 cm. There is no free air, free fluid or free hemorrhage. There are no acute inflammatory changes. Musculoskeletal: There is osteopenia and degenerative change of the spine, mild hip DJD. At L5-S1, advanced facet hypertrophy associated with grade 1 spondylolisthesis and moderate disc space loss. There is acquired spinal  stenosis at this level. IMPRESSION: 1. Cystitis versus bladder nondistention. 2. Large hiatal hernia. 3. Aortic and coronary artery atherosclerosis. 4. 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 5. Small umbilical, infraumbilical and inguinal fat hernias. 6. Stable 6.7 x 4.9 cm chronic fluid collection associated with a ventral mesh hernia repair. 7. Osteopenia and degenerative change. Aortic Atherosclerosis (ICD10-I70.0). Electronically Signed   By: Almira Bar M.D.   On: 07/08/2022 04:54       Kathlen Mody M.D. [facility] 07/09/2022, 1:27 PM  Available via Epic secure chat 7am-7pm After 7 pm, please refer to night coverage provider listed on amion.

## 2022-07-10 DIAGNOSIS — K859 Acute pancreatitis without necrosis or infection, unspecified: Secondary | ICD-10-CM | POA: Diagnosis not present

## 2022-07-10 DIAGNOSIS — K861 Other chronic pancreatitis: Secondary | ICD-10-CM | POA: Diagnosis not present

## 2022-07-10 LAB — BASIC METABOLIC PANEL
Anion gap: 10 (ref 5–15)
BUN: 7 mg/dL — ABNORMAL LOW (ref 8–23)
CO2: 17 mmol/L — ABNORMAL LOW (ref 22–32)
Calcium: 8.3 mg/dL — ABNORMAL LOW (ref 8.9–10.3)
Chloride: 113 mmol/L — ABNORMAL HIGH (ref 98–111)
Creatinine, Ser: 0.55 mg/dL (ref 0.44–1.00)
GFR, Estimated: >60 mL/min (ref >60)
Glucose, Bld: 71 mg/dL (ref 70–99)
Potassium: 4.2 mmol/L (ref 3.5–5.1)
Sodium: 140 mmol/L (ref 135–145)

## 2022-07-10 LAB — LIPASE, BLOOD: Lipase: 218 U/L — ABNORMAL HIGH (ref 11–51)

## 2022-07-10 LAB — HEPATIC FUNCTION PANEL
ALT: 7 U/L (ref 0–44)
AST: 10 U/L — ABNORMAL LOW (ref 15–41)
Albumin: 2.6 g/dL — ABNORMAL LOW (ref 3.5–5.0)
Alkaline Phosphatase: 74 U/L (ref 38–126)
Bilirubin, Direct: 0.5 mg/dL — ABNORMAL HIGH (ref 0.0–0.2)
Indirect Bilirubin: 1.1 mg/dL — ABNORMAL HIGH (ref 0.3–0.9)
Total Bilirubin: 1.6 mg/dL — ABNORMAL HIGH (ref 0.3–1.2)
Total Protein: 5.2 g/dL — ABNORMAL LOW (ref 6.5–8.1)

## 2022-07-10 MED ORDER — POTASSIUM CHLORIDE IN NACL 40-0.9 MEQ/L-% IV SOLN
INTRAVENOUS | Status: AC
  Filled 2022-07-10 (×2): qty 1000

## 2022-07-10 MED ORDER — TRAMADOL HCL 50 MG PO TABS
50.0000 mg | ORAL_TABLET | Freq: Four times a day (QID) | ORAL | Status: DC | PRN
  Administered 2022-07-10 – 2022-07-13 (×3): 50 mg via ORAL
  Filled 2022-07-10 (×4): qty 1

## 2022-07-10 NOTE — TOC Initial Note (Signed)
Transition of Care [facility]) - Initial/Assessment Note    Patient Details  Name: Stephanie Benton MRN: [id number] Date of Birth: [birth year]  Transition [facility]) CM/SW Contact:    Otelia Santee, LCSW Phone Number: 07/10/2022, 10:51 AM  Clinical Narrative:                 TOC consulted for HH/DME needs and family interest in rehab/increased [facility].  Spoke with pt's sister over the phone who shares that she is not aware if pt is already receiving HH. She shares that pt currently lives in an apartment alone. PT/OT have been consulted. TOC will await evaluations for recommendations.   Expected Discharge Plan: Home/Self Care Barriers to Discharge: Continued Medical Work up   Patient Goals and CMS Choice Patient states their goals for this hospitalization and ongoing recovery are:: To go home CMS Medicare.gov Compare Post Acute Care list provided to:: Patient Choice offered to / list presented to : Patient, Sibling  Expected Discharge Plan and Services Expected Discharge Plan: Home/Self Care In-house Referral: NA Discharge Planning Services: CM Consult Post Acute Care Choice: Skilled Nursing Facility, Home Health Living arrangements for the past 2 months: Apartment                 DME Arranged: N/A DME Agency: NA                  Prior Living Arrangements/Services Living arrangements for the past 2 months: Apartment Lives with:: Self Patient language and need for interpreter reviewed:: Yes Do you feel safe going back to the place where you live?: Yes      Need for Family Participation in Patient Care: No (Comment) Care giver support system in place?: No (comment) Current home services: DME Criminal Activity/Legal Involvement Pertinent to Current Situation/Hospitalization: No - Comment as needed  Activities of Daily Living Home Assistive Devices/Equipment: Eyeglasses ADL Screening (condition at time of admission) Patient's cognitive ability adequate to safely complete  daily activities?: Yes Is the patient deaf or have difficulty hearing?: No Does the patient have difficulty seeing, even when wearing glasses/contacts?: Yes Does the patient have difficulty concentrating, remembering, or making decisions?: No Patient able to express need for assistance with ADLs?: Yes Does the patient have difficulty dressing or bathing?: No Independently performs ADLs?: Yes (appropriate for developmental age) Does the patient have difficulty walking or climbing stairs?: Yes Weakness of Legs: Both Weakness of Arms/Hands: Both  Permission Sought/Granted Permission sought to share information with : Facility Medical sales representative, Case Manager, Family Supports Permission granted to share information with : Yes, Verbal Permission Granted  Share Information with NAME: Karin Lieu     Permission granted to share info w Relationship: Sister  Permission granted to share info w Contact Information: [phone]  Emotional Assessment Appearance:: Appears stated age Attitude/Demeanor/Rapport: Engaged Affect (typically observed): Calm, Accepting Orientation: : Oriented to Self, Oriented to Place, Oriented to  Time, Oriented to Situation Alcohol / Substance Use: Tobacco Use Psych Involvement: No (comment)  Admission diagnosis:  Acute on chronic pancreatitis (HCC) [K85.90, K86.1] Patient Active Problem List   Diagnosis Date Noted   Acute on chronic pancreatitis (HCC) 06/23/2022   Hypokalemia 06/21/2022   COPD (chronic obstructive pulmonary disease) (HCC) 06/21/2022   Glaucoma 06/21/2022   Abdominal pain, chronic, epigastric    LFTs abnormal    Pancreatitis 05/17/2022   Metabolic acidosis 05/17/2022   Esophagitis determined by endoscopy 05/17/2022   Dehydration 05/16/2022   Sleep disturbance 08/21/2021   Spondylolisthesis  at L5-S1 level 04/25/2021   Primary osteoarthritis of left knee 05/12/2018   Insomnia due to mental condition 05/08/2018   Chronic bilateral low  back pain 03/25/2018   Tobacco abuse counseling 02/18/2018   Hyperkalemia 02/18/2018   Chest pain 02/17/2018   COPD with acute exacerbation (HCC) 02/17/2018   Hypertension 02/17/2018   Depression with anxiety 02/17/2018   AKI (acute kidney injury) (HCC)    Normochromic normocytic anemia    Bipolar disorder, in partial remission, most recent episode mixed (HCC) 07/04/2017   Osteopenia of multiple sites 07/04/2017   Bipolar disorder, in full remission, most recent episode mixed (HCC) 07/04/2017   Bipolar disorder, in partial remission, most recent episode depressed (HCC) 07/04/2017   Cigarette smoker 06/12/2017   Bilateral hip pain 05/01/2017   B12 deficiency 03/04/2017   Spondylosis of 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 [place] [address] Phone: [phone] Fax: [phone]  [facility] - [place], [place] - [address] [place]  [address] Phone: [phone] Fax: [phone]  [facility] [address] [place] [address] Phone: [phone] Fax: [phone]     Social Determinants of Health (SDOH) Interventions    Readmission Risk Interventions    07/10/2022   10:49 AM 06/24/2022    9:42 AM 05/21/2022   10:04 AM  Readmission Risk Prevention Plan  Transportation Screening Complete Complete Complete  PCP or Specialist Appt within 3-5 Days  Complete   Home Care Screening   Complete  Medication Review (RN CM)   Complete  HRI or Home Care Consult  Complete   Social Work Consult for Recovery Care Planning/Counseling  Complete   Palliative Care Screening  Not Applicable   Medication Review Oceanographer) Complete  Complete   PCP or Specialist appointment within 3-5 days of discharge Complete    HRI or Home Care Consult Complete    SW Recovery Care/Counseling Consult Complete    Palliative Care Screening Not Applicable

## 2022-07-10 NOTE — Evaluation (Signed)
Physical Therapy Evaluation Patient Details Name: Stephanie Benton MRN: [id number] DOB: Apr 10, 1951 Today's Date: 07/10/2022  History of Present Illness  71 yo female admitted with acute on chronic pancreatitis. Hx of recurrent pancreatitis, COPD, anemia, chronic pain, lumbar spondylosis, anxiety, bipolar. asthma  Clinical Impression  On eval, pt was Min A for mobility. She was able to stand at bedside with RW. She took several side steps along the bedside with the RW. Ambulation was deferred 2* pt report of dizziness and "I can't do this." Discussed d/c plan-pt plans to return home where she lives alone. Pt stated she was not open to SNF rehab at the time of PT eval. Will plan to follow and progress activity as tolerated.        Recommendations for follow up therapy are one component of a multi-disciplinary discharge planning process, led by the attending physician.  Recommendations may be updated based on patient status, additional functional criteria and insurance authorization.  Follow Up Recommendations Home health PT vs SNF-depending on progress (pt declined SNF placement at time of PT eval)      Assistance Recommended at Discharge    Patient can return home with the following  A little help with walking and/or transfers;A little help with bathing/dressing/bathroom;Direct supervision/assist for financial management;Assistance with cooking/housework;Assist for transportation;Help with stairs or ramp for entrance    Equipment Recommendations None recommended by PT  Recommendations for Other Services  OT consult    Functional Status Assessment Patient has had a recent decline in their functional status and demonstrates the ability to make significant improvements in function in a reasonable and predictable amount of time.     Precautions / Restrictions Precautions Precautions: Fall Precaution Comments: dizzy Restrictions Weight Bearing Restrictions: No      Mobility  Bed  Mobility Overal bed mobility: Needs Assistance Bed Mobility: Supine to Sit, Sit to Supine     Supine to sit: Min assist, HOB elevated Sit to supine: Min assist, HOB elevated   General bed mobility comments: Assist for LEs. Increased time.    Transfers Overall transfer level: Needs assistance Equipment used: Rolling walker (2 wheels) Transfers: Sit to/from Stand Sit to Stand: Min assist           General transfer comment: A to steady. Increased time. Cues for safety, hand placement.    Ambulation/Gait Ambulation/Gait assistance: Min guard   Assistive device: Rolling walker (2 wheels)         General Gait Details: Deferred ambulation away from bed 2* pt c/o dizziness and "I can't do this." Pt was able to take side steps along the bedside with RW.  Stairs            Wheelchair Mobility    Modified Rankin (Stroke Patients Only)       Balance Overall balance assessment: Needs assistance         Standing balance support: Bilateral upper extremity supported Standing balance-Leahy Scale: Poor                               Pertinent Vitals/Pain Pain Assessment Pain Assessment: 0-10 Pain Score: 8  Pain Location: back and abdomen Pain Descriptors / Indicators: Discomfort, Aching, Sore Pain Intervention(s): Limited activity within patient's tolerance, Monitored during session, Repositioned    Home Living Family/patient expects to be discharged to:: Private residence Living Arrangements: Alone Available Help at Discharge: Family;Available PRN/intermittently Type of Home: Apartment Home Access: Level entry  Home Layout: One level Home Equipment: Cane - quad;Shower seat;Educational psychologist (2 wheels)      Prior Function Prior Level of Function : Independent/Modified Independent             Mobility Comments: use of cane for mobility ADLs Comments: Modified Independent with ADLs, household IADLs. recently stopped driving  - family recently asked her to stop driving. they assist with transportation needs now     Hand Dominance        Extremity/Trunk Assessment   Upper Extremity Assessment Upper Extremity Assessment: Defer to OT evaluation    Lower Extremity Assessment Lower Extremity Assessment: Generalized weakness    Cervical / Trunk Assessment Cervical / Trunk Assessment: Normal  Communication   Communication: No difficulties  Cognition Arousal/Alertness: Awake/alert Behavior During Therapy: WFL for tasks assessed/performed Overall Cognitive Status: Within Functional Limits for tasks assessed                                          General Comments      Exercises     Assessment/Plan    PT Assessment Patient needs continued PT services  PT Problem List Decreased strength;Decreased activity tolerance;Decreased balance;Decreased mobility;Pain;Decreased knowledge of use of DME       PT Treatment Interventions DME instruction;Therapeutic exercise;Balance training;Gait training;Functional mobility training;Therapeutic activities;Patient/family education    PT Goals (Current goals can be found in the Care Plan section)  Acute Rehab PT Goals Patient Stated Goal: return home PT Goal Formulation: With patient Time For Goal Achievement: 07/24/22 Potential to Achieve Goals: Good    Frequency Min 3X/week     Co-evaluation               AM-PAC PT "6 Clicks" Mobility  Outcome Measure Help needed turning from your back to your side while in a flat bed without using bedrails?: None Help needed moving from lying on your back to sitting on the side of a flat bed without using bedrails?: A Little Help needed moving to and from a bed to a chair (including a wheelchair)?: A Little Help needed standing up from a chair using your arms (e.g., wheelchair or bedside chair)?: A Little Help needed to walk in hospital room?: A Little Help needed climbing 3-5 steps with a  railing? : A Lot 6 Click Score: 18    End of Session Equipment Utilized During Treatment: Gait belt Activity Tolerance:  (limited by dizziness) Patient left: in bed;with call bell/phone within reach;with bed alarm set   PT Visit Diagnosis: History of falling (Z91.81);Muscle weakness (generalized) (M62.81);Difficulty in walking, not elsewhere classified (R26.2)    Time: 9604-5409 PT Time Calculation (min) (ACUTE ONLY): 16 min   Charges:   PT Evaluation $PT Eval Low Complexity: 1 Low            Faye Ramsay, PT Acute Rehabilitation  Office: [phone]

## 2022-07-10 NOTE — Progress Notes (Signed)
Subjective: Since I last evaluated the patient, she seems to be feeling much better. She claims she is more comfortable today but still rates her pain at a 8/10. She had a BM yesterday. She denies having any nausea or vomiting and is tolerating her well. The cause of her abdominal pain is perplexing as she has an elevated Lipase but also has many other reasons for the abdominal pain including the abdominal hernias complicated by the wall mesh. Mild unconjugated hyperbilirubinemia is noted on fractionation of the bilirubin.  Objective: Vital signs in last 24 hours: Temp:  [97.8 F (36.6 C)-98.2 F (36.8 C)] 98.1 F (36.7 C) (12/06 1533) Pulse Rate:  [84-99] 99 (12/06 1533) Resp:  [16-18] 18 (12/06 1533) BP: (119-146)/(65-79) 137/75 (12/06 1533) SpO2:  [97 %-100 %] 99 % (12/06 1533)   Intake/Output from previous day: 12/05 0701 - 12/06 0700 In: 1321.2 [I.V.:1321.2] Out: -  Intake/Output this shift: No intake/output data recorded.  General appearance: alert, cooperative, appears stated age, no distress, and mildly obese Resp: clear to auscultation bilaterally Cardio: regular rate and rhythm, S1, S2 normal, no murmur, click, rub or gallop GI: soft, non-tender; bowel sounds normal; no masses,  no organomegaly  Lab Results: Recent Labs    07/07/22 2232 07/09/22 0513  WBC 5.7 6.1  HGB 10.2* 8.9*  HCT 31.6* 27.2*  PLT 277 273   BMET Recent Labs    07/07/22 2232 07/09/22 0513 07/10/22 0554  NA 138 141 140  K 2.7* 2.9* 4.2  CL 103 110 113*  CO2 19* 21* 17*  GLUCOSE 68* 73 71  BUN 6* 8 7*  CREATININE 0.80 0.75 0.55  CALCIUM 8.9 8.6* 8.3*   LFT Recent Labs    07/10/22 0554  PROT 5.2*  ALBUMIN 2.6*  AST 10*  ALT 7  ALKPHOS 74  BILITOT 1.6*  BILIDIR 0.5*  IBILI 1.1*   Studies/Results: No results found.  Medications: I have reviewed the patient's current medications. Prior to Admission:  Medications Prior to Admission  Medication Sig Dispense Refill Last Dose    albuterol (PROVENTIL) (2.5 MG/3ML) 0.083% nebulizer solution Take 3 mLs (2.5 mg total) by nebulization every 6 (six) hours as needed for wheezing or shortness of breath. 75 mL 12 Past Week   albuterol (VENTOLIN HFA) 108 (90 Base) MCG/ACT inhaler Inhale 2 puffs into the lungs every 6 (six) hours as needed for wheezing or shortness of breath.   07/06/2022   dexlansoprazole (DEXILANT) 60 MG capsule Take 60 mg by mouth daily.   07/07/2022   diclofenac sodium (VOLTAREN) 1 % GEL Apply 2 g topically daily as needed (for knee pain).   07/07/2022   dorzolamide (TRUSOPT) 2 % ophthalmic solution Place 1 drop into both eyes 2 (two) times daily.   07/07/2022   DULoxetine 40 MG CPEP Take 40 mg by mouth daily. 30 capsule 0 07/07/2022   feeding supplement (ENSURE ENLIVE / ENSURE PLUS) LIQD Take 237 mLs by mouth 2 (two) times daily between meals. (Patient taking differently: Take 237 mLs by mouth daily.) 10000 mL 0 07/07/2022   FEROSUL 325 (65 Fe) MG tablet Take 325 mg by mouth daily with breakfast.   07/07/2022   latanoprost (XALATAN) 0.005 % ophthalmic solution Place 1 drop into both eyes at bedtime.   07/06/2022   ondansetron (ZOFRAN) 4 MG tablet Take 1 tablet (4 mg total) by mouth every 6 (six) hours. 12 tablet 0 07/06/2022   polyethylene glycol powder (GLYCOLAX/MIRALAX) 17 GM/SCOOP powder Take 1 cap full (  17 g) with water by mouth daily. (Patient taking differently: Take 17 g by mouth daily as needed for mild constipation.) 238 g 0 unk   potassium chloride (KLOR-CON) 10 MEQ tablet Take 10 mEq by mouth daily. X 7 days   07/07/2022   pregabalin (LYRICA) 100 MG capsule Take 1 capsule (100 mg total) by mouth 3 (three) times daily.   07/07/2022   risperiDONE (RISPERDAL) 1 MG tablet Take 1 mg by mouth at bedtime.    07/06/2022   sertraline (ZOLOFT) 100 MG tablet Take 100 mg by mouth at bedtime.    07/06/2022   simvastatin (ZOCOR) 40 MG tablet Take 40 mg by mouth at bedtime.    07/06/2022   sucralfate (CARAFATE) 1 GM/10ML suspension  Take 10 mLs (1 g total) by mouth 2 (two) times daily. 600 mL 0 07/07/2022   SYMBICORT 160-4.5 MCG/ACT inhaler Inhale 1 puff into the lungs 2 (two) times daily as needed (for flares).    Past Week   telmisartan (MICARDIS) 80 MG tablet Take 80 mg by mouth at bedtime.    07/06/2022   tolterodine (DETROL LA) 2 MG 24 hr capsule Take 2 mg by mouth daily.   07/07/2022   torsemide (DEMADEX) 10 MG tablet Take 10 mg by mouth daily as needed (for swelling in legs).   07/05/2022   traMADol (ULTRAM) 50 MG tablet Take 1 tablet (50 mg total) by mouth every 8 (eight) hours as needed for severe pain or moderate pain. 15 tablet 0 Past Week   venlafaxine XR (EFFEXOR-XR) 37.5 MG 24 hr capsule Take 37.5 mg by mouth daily with breakfast.   07/06/2022   Potassium Chloride 10 MEQ PACK Take 10 mEq by mouth daily for 7 days. 7 each 0    Scheduled:  dorzolamide  1 drop Both Eyes BID   fesoterodine  4 mg Oral Daily   irbesartan  300 mg Oral Daily   latanoprost  1 drop Both Eyes QHS   pantoprazole (PROTONIX) IV  40 mg Intravenous Q12H   potassium chloride  40 mEq Oral BID   risperiDONE  1 mg Oral QHS   sertraline  100 mg Oral QHS   sucralfate  1 g Oral BID   venlafaxine XR  37.5 mg Oral Daily   Continuous:  0.9 % NaCl with KCl 40 mEq / L 100 mL/hr at 07/11/22 0305   ZOX:[id number] **OR** acetaminophen, HYDROmorphone (DILAUDID) injection, ondansetron **OR** ondansetron (ZOFRAN) IV, mouth rinse, prochlorperazine, traMADol Anti-infectives (From admission, onward)    None      Assessment/Plan: 1) ?Acute pancreatitis-continue supportive care. Advance her diet as tolerated. She has a remote history of alcohol abuse. I agree with Dr. Jerral Ralph about minimizing the administration of her narcotic medications.  2) GERD/Large hiatal hernia-on PPI's. 3) Small posterior diaphragmatic fat herniation is noted along with small umbilical hernia the midline along with infraumbilical fat hernias and small inguinal fat hernias with no  evidence of incarceration of the hernias. There is a stable 6.7 x 4.9 cm chronic fluid collection associate with a ventral hernia repair with mesh.   2) Hypertension/hyperlipidemia 3) Depression and anxiety disorder; history of alcohol abuse over 10 years ago but patient has been abstinent since then. 4) COPD with longstanding history of tobacco abuse over 60 pack years. She stopped smoking 4 months ago. 5) Hypokalemia-resolved. 6) Anemia-?dilutional.Will monitor closely    LOS: 2 days   Stephanie Benton 07/10/2022, 4:57 PM

## 2022-07-10 NOTE — Progress Notes (Signed)
PROGRESS NOTE    Stephanie Benton  TKZ:[id number] DOB: June 05, 1951 DOA: 07/07/2022 PCP: Sharmon Revere, MD    Brief Narrative:  71 year old female with history of hypertension hyperlipidemia, chronic abdominal pain, anxiety and bipolar 1 disorder who presented to the emergency room with abdominal pain associated with multiple episodes of emesis apparently after eating some [place] fries.  In the emergency room on room air.  Lipase 92.  CT scan with bladder distention, large hiatal hernia, aortic and coronary artery atherosclerosis.  7 x 5 cm chronic fluid collection associated with ventral mesh hernia repair on abdominal wall.  Subsequently her lipase level went up.  Admitted with acute on chronic abdominal pain.   Assessment & Plan:   Acute on chronic pancreatitis:  Acute on chronic abdominal pain.  Painful epigastric lump.   With history of pancreatitis in the past.  No alcohol use.  Cholecystectomy.  Continues to have pain. Continue clears.  Adequate IV pain medications.  She has chronic abdominal pain, will increase dose of tramadol to every 6 hours and try to minimize use of IV pain medications.  Lipase level is trending down.  CT scan with no evidence of pancreatitis. Wonder all her pain is related to ventral hernia repair and fluid collection on her epigastrium.  Symptomatic treatment. Trial of IV PPI and sucralfate.  Depression and anxiety: Patient on risperidone, sertraline, venlafaxine that is continued.  Hypokalemia: Replaced and adequate.  Oral pain medications.  Start mobilizing with PT OT.   DVT prophylaxis: SCDs Start: 07/08/22 0839   Code Status: Full code Family Communication: None Disposition Plan: Status is: Inpatient Remains inpatient appropriate because: Persistent abdominal pain     Consultants:  Gastroenterology  Procedures:  None  Antimicrobials:  None   Subjective: Patient seen and examined.  Complains of persistent abdominal pain mostly on the  superficial abdominal wall.  She does have tender swelling corresponding to mesh repair and chronic fluid collection seen in the CT scan.  Patient tells me that she hurts all the time and no relation to diet.  Not mobilized yet.  No bowel movement since admission.  Objective: Vitals:   07/09/22 1527 07/09/22 1930 07/09/22 2335 07/10/22 0334  BP: 129/86 (!) 141/79 (!) 146/68 119/65  Pulse: 94 85 84 94  Resp: 16 16 18 16   Temp: 98.1 F (36.7 C) 97.8 F (36.6 C) 98 F (36.7 C) 98.2 F (36.8 C)  TempSrc:   Oral Oral  SpO2: 100% 100% 100% 97%  Weight:      Height:        Intake/Output Summary (Last 24 hours) at 07/10/2022 1326 Last data filed at 07/10/2022 0500 Gross per 24 hour  Intake 1321.23 ml  Output --  Net 1321.23 ml   Filed Weights   07/07/22 2209  Weight: 81.6 kg    Examination:  General exam: Appears calm and comfortable at rest. Frail and debilitated.  Not in any distress.  Anxious on examination. Respiratory system: Clear to auscultation. Respiratory effort normal.  No added sounds. Cardiovascular system: S1 & S2 heard, RRR.  Gastrointestinal system: Soft.  Point tenderness at the epigastrium with palpable swelling that is firm and tender. Central nervous system: Alert and oriented. No focal neurological deficits. Extremities: Profound generalized weakness.    Data Reviewed: I have personally reviewed following labs and imaging studies  CBC: Recent Labs  Lab 07/07/22 2232 07/09/22 0513  WBC 5.7 6.1  NEUTROABS 4.0  --   HGB 10.2* 8.9*  HCT 31.6* 27.2*  MCV 81.7 81.0  PLT 277 273   Basic Metabolic Panel: Recent Labs  Lab 07/07/22 2232 07/09/22 0513 07/10/22 0554  NA 138 141 140  K 2.7* 2.9* 4.2  CL 103 110 113*  CO2 19* 21* 17*  GLUCOSE 68* 73 71  BUN 6* 8 7*  CREATININE 0.80 0.75 0.55  CALCIUM 8.9 8.6* 8.3*  MG 2.0  --   --    GFR: Estimated Creatinine Clearance: 67 mL/min (by C-G formula based on SCr of 0.55 mg/dL). Liver Function  Tests: Recent Labs  Lab 07/07/22 2232 07/09/22 0513 07/10/22 0554  AST 14* 13* 10*  ALT 9 8 7   ALKPHOS 86 83 74  BILITOT 3.1* 2.1* 1.6*  PROT 6.0* 5.9* 5.2*  ALBUMIN 2.9* 2.7* 2.6*   Recent Labs  Lab 07/07/22 2232 07/09/22 0513 07/10/22 0554  LIPASE 92* 480* 218*   No results for input(s): "AMMONIA" in the last 168 hours. Coagulation Profile: No results for input(s): "INR", "PROTIME" in the last 168 hours. Cardiac Enzymes: No results for input(s): "CKTOTAL", "CKMB", "CKMBINDEX", "TROPONINI" in the last 168 hours. BNP (last 3 results) No results for input(s): "PROBNP" in the last 8760 hours. HbA1C: No results for input(s): "HGBA1C" in the last 72 hours. CBG: Recent Labs  Lab 07/08/22 0352  GLUCAP 87   Lipid Profile: No results for input(s): "CHOL", "HDL", "LDLCALC", "TRIG", "CHOLHDL", "LDLDIRECT" in the last 72 hours. Thyroid Function Tests: No results for input(s): "TSH", "T4TOTAL", "FREET4", "T3FREE", "THYROIDAB" in the last 72 hours. Anemia Panel: Recent Labs    07/09/22 1606  VITAMINB12 1,104*  FOLATE 5.9*  FERRITIN 269  TIBC 160*  IRON 80   Sepsis Labs: No results for input(s): "PROCALCITON", "LATICACIDVEN" in the last 168 hours.  No results found for this or any previous visit (from the past 240 hour(s)).       Radiology Studies: No results found.      Scheduled Meds:  dorzolamide  1 drop Both Eyes BID   fesoterodine  4 mg Oral Daily   irbesartan  300 mg Oral Daily   latanoprost  1 drop Both Eyes QHS   pantoprazole (PROTONIX) IV  40 mg Intravenous Q12H   potassium chloride  40 mEq Oral BID   risperiDONE  1 mg Oral QHS   sertraline  100 mg Oral QHS   sucralfate  1 g Oral BID   venlafaxine XR  37.5 mg Oral Daily   Continuous Infusions:  0.9 % NaCl with KCl 40 mEq / L       LOS: 2 days    Time spent: 35 minutes    14/05/23, MD [facility] Pager [phone]

## 2022-07-11 DIAGNOSIS — K859 Acute pancreatitis without necrosis or infection, unspecified: Secondary | ICD-10-CM | POA: Diagnosis not present

## 2022-07-11 DIAGNOSIS — K861 Other chronic pancreatitis: Secondary | ICD-10-CM | POA: Diagnosis not present

## 2022-07-11 LAB — CBC WITH DIFFERENTIAL/PLATELET
Abs Immature Granulocytes: 0.02 10*3/uL (ref 0.00–0.07)
Basophils Absolute: 0 10*3/uL (ref 0.0–0.1)
Basophils Relative: 0 %
Eosinophils Absolute: 0.2 10*3/uL (ref 0.0–0.5)
Eosinophils Relative: 3 %
HCT: 22.3 % — ABNORMAL LOW (ref 36.0–46.0)
Hemoglobin: 7.2 g/dL — ABNORMAL LOW (ref 12.0–15.0)
Immature Granulocytes: 0 %
Lymphocytes Relative: 10 %
Lymphs Abs: 0.7 10*3/uL (ref 0.7–4.0)
MCH: 26.7 pg (ref 26.0–34.0)
MCHC: 32.3 g/dL (ref 30.0–36.0)
MCV: 82.6 fL (ref 80.0–100.0)
Monocytes Absolute: 0.5 10*3/uL (ref 0.1–1.0)
Monocytes Relative: 8 %
Neutro Abs: 5.2 10*3/uL (ref 1.7–7.7)
Neutrophils Relative %: 79 %
Platelets: 265 10*3/uL (ref 150–400)
RBC: 2.7 MIL/uL — ABNORMAL LOW (ref 3.87–5.11)
RDW: 17.2 % — ABNORMAL HIGH (ref 11.5–15.5)
WBC: 6.6 10*3/uL (ref 4.0–10.5)
nRBC: 0 % (ref 0.0–0.2)

## 2022-07-11 LAB — LIPASE, BLOOD: Lipase: 140 U/L — ABNORMAL HIGH (ref 11–51)

## 2022-07-11 LAB — VITAMIN D 25 HYDROXY (VIT D DEFICIENCY, FRACTURES): Vit D, 25-Hydroxy: 28.54 ng/mL — ABNORMAL LOW (ref 30–100)

## 2022-07-11 LAB — HEPATIC FUNCTION PANEL
ALT: 7 U/L (ref 0–44)
AST: 11 U/L — ABNORMAL LOW (ref 15–41)
Albumin: 2.1 g/dL — ABNORMAL LOW (ref 3.5–5.0)
Alkaline Phosphatase: 69 U/L (ref 38–126)
Bilirubin, Direct: 0.4 mg/dL — ABNORMAL HIGH (ref 0.0–0.2)
Indirect Bilirubin: 1 mg/dL — ABNORMAL HIGH (ref 0.3–0.9)
Total Bilirubin: 1.4 mg/dL — ABNORMAL HIGH (ref 0.3–1.2)
Total Protein: 5.1 g/dL — ABNORMAL LOW (ref 6.5–8.1)

## 2022-07-11 LAB — C-REACTIVE PROTEIN: CRP: 8 mg/dL — ABNORMAL HIGH (ref <1.0)

## 2022-07-11 MED ORDER — FOLIC ACID 1 MG PO TABS
1.0000 mg | ORAL_TABLET | Freq: Every day | ORAL | Status: DC
  Administered 2022-07-13: 1 mg via ORAL
  Filled 2022-07-11: qty 1

## 2022-07-11 MED ORDER — BOOST / RESOURCE BREEZE PO LIQD CUSTOM
1.0000 | Freq: Three times a day (TID) | ORAL | Status: DC
  Administered 2022-07-11 – 2022-07-13 (×5): 1 via ORAL

## 2022-07-11 MED ORDER — SODIUM CHLORIDE 0.9 % IV SOLN: INTRAVENOUS | Status: DC

## 2022-07-11 MED ORDER — PREGABALIN 50 MG PO CAPS
100.0000 mg | ORAL_CAPSULE | Freq: Three times a day (TID) | ORAL | Status: DC
  Administered 2022-07-11 – 2022-07-13 (×6): 100 mg via ORAL
  Filled 2022-07-11 (×6): qty 2

## 2022-07-11 NOTE — Progress Notes (Signed)
Subjective: Feeling better.  Objective: Vital signs in last 24 hours: Temp:  [97.8 F (36.6 C)-98.2 F (36.8 C)] 98 F (36.7 C) (12/07 1443) Pulse Rate:  [93-99] 93 (12/07 1443) Resp:  [16-20] 16 (12/07 1443) BP: (105-136)/(64-75) 105/64 (12/07 1443) SpO2:  [99 %-100 %] 100 % (12/07 1443)    Intake/Output from previous day: 12/06 0701 - 12/07 0700 In: 1774 [P.O.:574; I.V.:1200] Out: -  Intake/Output this shift: Total I/O In: 472 [P.O.:472] Out: -   General appearance: alert and no distress GI: tender in the epigastric region  Lab Results: Recent Labs    07/09/22 0513 07/11/22 0553  WBC 6.1 6.6  HGB 8.9* 7.2*  HCT 27.2* 22.3*  PLT 273 265   BMET Recent Labs    07/09/22 0513 07/10/22 0554  NA 141 140  K 2.9* 4.2  CL 110 113*  CO2 21* 17*  GLUCOSE 73 71  BUN 8 7*  CREATININE 0.75 0.55  CALCIUM 8.6* 8.3*   LFT Recent Labs    07/11/22 0553  PROT 5.1*  ALBUMIN 2.1*  AST 11*  ALT 7  ALKPHOS 69  BILITOT 1.4*  BILIDIR 0.4*  IBILI 1.0*   PT/INR No results for input(s): "LABPROT", "INR" in the last 72 hours. Hepatitis Panel No results for input(s): "HEPBSAG", "HCVAB", "HEPAIGM", "HEPBIGM" in the last 72 hours. C-Diff No results for input(s): "CDIFFTOX" in the last 72 hours. Fecal Lactopherrin No results for input(s): "FECLLACTOFRN" in the last 72 hours.  Studies/Results: No results found.  Medications: Scheduled:  dorzolamide  1 drop Both Eyes BID   feeding supplement  1 Container Oral TID BM   fesoterodine  4 mg Oral Daily   folic acid  1 mg Oral Daily   irbesartan  300 mg Oral Daily   latanoprost  1 drop Both Eyes QHS   pantoprazole (PROTONIX) IV  40 mg Intravenous Q12H   potassium chloride  40 mEq Oral BID   pregabalin  100 mg Oral TID   risperiDONE  1 mg Oral QHS   sertraline  100 mg Oral QHS   sucralfate  1 g Oral BID   venlafaxine XR  37.5 mg Oral Daily   Continuous:  Assessment/Plan: 1) Acute pancreatitis. 2) Worsening  anemia. 3) History of an LA Grade B esophagitis. 4) Hyperbilirubinemia - improving.   The patient is improving clinically, however, she is still tender with palpation of her abdomen.  Further evaluation with an EGD will be performed as she had evidence of an esophagitis in 05/2022.  Plan: 1) EGD tomorrow. 2) Continue with supportive care. 3) Follow HGB and transfuse as necessary.  LOS: 3 days   Stuart Guillen D 07/11/2022, 6:35 PM

## 2022-07-11 NOTE — TOC Progression Note (Signed)
Transition of Care [facility]) - Progression Note    Patient Details  Name: Stephanie Benton MRN: [id number] Date of Birth: [birth year]  Transition of Care [facility]) CM/SW [place], LCSW Phone Number: 07/11/2022, 10:47 AM  Clinical Narrative:    Met with pt and confirmed plan for [facility]. Pt declines SNF and states she currently receives Outpatient Care[facility] services with [facility]. HHPT/OT/RN/Aide have been arranged/confirmed with [facility]. ROC orders will need to be placed prior to discharge.  Pt shares she has a RW, BSC, Cane, and shower seat at home. Declines further DME needs.    Expected Discharge Plan: Home/Self Care Barriers to Discharge: Continued Medical Work up  Expected Discharge Plan and Services Expected Discharge Plan: Home/Self Care In-house Referral: NA Discharge Planning Services: CM Consult Post Acute Care Choice: [place] arrangements for the past 2 months: Apartment                 DME Arranged: N/A DME Agency: NA       HH Arranged: RN, PT, OT, Nurse's Aide [place] Agency: [facility] Date [place]: 07/11/22 Time [place]: [id number] Representative spoke with at [place]: [place] ([place]) Interventions    Readmission Risk Interventions    07/10/2022   10:49 AM 06/24/2022    9:42 AM 05/21/2022   10:04 AM  Readmission Risk Prevention Plan  Transportation Screening Complete Complete Complete  PCP or Specialist Appt within 3-5 Days  Complete   Home Care Screening   Complete  Medication Review (RN CM)   Complete  HRI or Home Care Consult  Complete   Social Work Consult for [place] Planning/Counseling  Complete   Palliative Care Screening  Not Applicable   Medication Review Press photographer) Complete Complete   PCP or Specialist appointment within 3-5 days of discharge Complete    HRI or [place] Complete    SW Recovery Care/Counseling Consult Complete    Palliative  Care Screening Not Applicable

## 2022-07-11 NOTE — Care Management Important Message (Signed)
Important Message  Patient Details IM Letter given. Name: Stephanie Benton MRN: [id number] Date of Birth: [birth year]   Medicare Important Message Given:  Yes     Caren Macadam 07/11/2022, 9:35 AM

## 2022-07-11 NOTE — Progress Notes (Signed)
Physical Therapy Treatment Patient Details Name: Stephanie Benton MRN: [id number] DOB: Mar 10, 1951 Today's Date: 07/11/2022   History of Present Illness Stephanie Benton is a 71 yr old female admitted to the hospital with abdominal pain and emesis. PMH: HLD, recurrent pancreatitis, L spine spondylosis, HTN, anxiety, bipolar disorder    PT Comments    Pt ambulated 12' x 2 with RW, from bed to toilet and then back to bed. Once seated at edge of bed pt became nauseous and started vomiting. RN called to room, RN stated pt had nausea medication recently. Pt is not safe to DC home alone at this point. Pt reports she has a neighbor that can assist her at times, but only for brief periods. Pt may need ST-SNF if her activity tolerance doesn't improve, and if 24/7 assistance isn't available at home.     Recommendations for follow up therapy are one component of a multi-disciplinary discharge planning process, led by the attending physician.  Recommendations may be updated based on patient status, additional functional criteria and insurance authorization.  Follow Up Recommendations  Home health PT vs ST-SNF, depending on progress     Assistance Recommended at Discharge Intermittent Supervision/Assistance  Patient can return home with the following A little help with walking and/or transfers;A little help with bathing/dressing/bathroom;Direct supervision/assist for financial management;Assistance with cooking/housework;Assist for transportation;Help with stairs or ramp for entrance   Equipment Recommendations  None recommended by PT    Recommendations for Other Services OT consult     Precautions / Restrictions Precautions Precautions: Fall Restrictions Weight Bearing Restrictions: No Other Position/Activity Restrictions: she reports of history of multiple falls     Mobility  Bed Mobility               General bed mobility comments: sitting at EOB    Transfers Overall transfer level:  Needs assistance Equipment used: Rolling walker (2 wheels) Transfers: Sit to/from Stand Sit to Stand: Min guard           General transfer comment: VCs hand placement, no unsteadiness but pt reported mild dizziness upon standing (she stated this is baseline)    Ambulation/Gait Ambulation/Gait assistance: Min guard Gait Distance (Feet): 24 Feet (12' x 2) Assistive device: Rolling walker (2 wheels) Gait Pattern/deviations: Step-through pattern, Decreased stride length Gait velocity: decr     General Gait Details: VCs for proximity to RW (pt tends to step too far behind RW). 12' x 2 from bed to bathroom then to bed. Pt became nauseous and vomited after sitting at edge of bed. RN notified.   Stairs             Wheelchair Mobility    Modified Rankin (Stroke Patients Only)       Balance Overall balance assessment: Needs assistance   Sitting balance-Leahy Scale: Good     Standing balance support: Bilateral upper extremity supported, During functional activity, Reliant on assistive device for balance Standing balance-Leahy Scale: Fair Standing balance comment: min guard for static standing using a RW                            Cognition Arousal/Alertness: Awake/alert Behavior During Therapy: WFL for tasks assessed/performed, Flat affect Overall Cognitive Status: Within Functional Limits for tasks assessed                                 General Comments: Oriented  x4, able to follow commands consistently        Exercises      General Comments        Pertinent Vitals/Pain Pain Assessment Pain Score: 4  Pain Location: abdomen Pain Descriptors / Indicators: Discomfort, Aching, Sore Pain Intervention(s): Limited activity within patient's tolerance, Monitored during session, Premedicated before session    Home Living Family/patient expects to be discharged to:: Private residence Living Arrangements: Alone Available Help at  Discharge: Family;Available PRN/intermittently Type of Home: Apartment Home Access: Level entry       Home Layout: One level Home Equipment: Pharmacist, hospital (2 wheels);BSC/3in1 (Hurrycane)      Prior Function            PT Goals (current goals can now be found in the care plan section) Acute Rehab PT Goals Patient Stated Goal: return home PT Goal Formulation: With patient Time For Goal Achievement: 07/24/22 Potential to Achieve Goals: Good Progress towards PT goals: Progressing toward goals    Frequency    Min 3X/week      PT Plan Current plan remains appropriate    Co-evaluation              AM-PAC PT "6 Clicks" Mobility   Outcome Measure  Help needed turning from your back to your side while in a flat bed without using bedrails?: None Help needed moving from lying on your back to sitting on the side of a flat bed without using bedrails?: A Little Help needed moving to and from a bed to a chair (including a wheelchair)?: A Little Help needed standing up from a chair using your arms (e.g., wheelchair or bedside chair)?: A Little Help needed to walk in hospital room?: A Little Help needed climbing 3-5 steps with a railing? : A Little 6 Click Score: 19    End of Session Equipment Utilized During Treatment: Gait belt Activity Tolerance: Patient tolerated treatment well   Nurse Communication: Mobility status PT Visit Diagnosis: History of falling (Z91.81);Muscle weakness (generalized) (M62.81);Difficulty in walking, not elsewhere classified (R26.2)     Time: 5809-9833 PT Time Calculation (min) (ACUTE ONLY): 21 min  Charges:  $Gait Training: 8-22 mins                     Ralene Bathe Kistler PT 07/11/2022  Acute Rehabilitation Services  Office [phone]

## 2022-07-11 NOTE — Progress Notes (Signed)
PROGRESS NOTE    Stephanie Benton  WUG:[id number] DOB: June 09, 1951 DOA: 07/07/2022 PCP: Sharmon Revere, MD    Brief Narrative:  71 year old female with history of hypertension hyperlipidemia, chronic abdominal pain, anxiety and bipolar 1 disorder who presented to the emergency room with abdominal pain associated with multiple episodes of emesis apparently after eating some [place] fries.  In the emergency room on room air.  Lipase 92.  CT scan with bladder distention, large hiatal hernia, aortic and coronary artery atherosclerosis.  7 x 5 cm chronic fluid collection associated with ventral mesh hernia repair on abdominal wall.  Subsequently her lipase level went up.  Admitted with acute on chronic abdominal pain.   Assessment & Plan:   Acute on chronic pancreatitis:  Acute on chronic abdominal pain.  Painful epigastric lump.   With history of pancreatitis in the past.  No alcohol use.  Cholecystectomy.  Some clinical improvement today, however occasional moderate to severe pain.  Continue clear liquid diet.  Adequate IV pain medications.  Tramadol dose increased to 4 times daily.  She is also on Lyrica, will resume.  minimize use of IV pain medications.  Lipase level is trending down.  CT scan with no evidence of pancreatitis. Wonder all her pain is related to ventral hernia repair and fluid collection on her epigastrium.  Symptomatic treatment. Trial of IV PPI and sucralfate.  Chronic pain syndrome: As above.  Lyrica and increased dose of tramadol.  Acute on chronic nutritional anemia: Iron and ferritin levels are adequate.  B12 level is adequate.  Folic acid level is 5.9 and will start replacement. Patient does have history of severe esophagitis with bleeding from esophagitis status post EGD on 03/17/2022.  Currently remains on Protonix.  Hemoglobin continues to drift down.  Will ask GI if patient needs to have another endoscopy.  No evidence of active bleeding.  Hemoglobin trended down from  11-7.2.  Will continue to monitor.  Currently no indication for transfusion.  Patient has consented for blood transfusion if clinically indicated.  Depression and anxiety: Patient on risperidone, sertraline, venlafaxine that is continued.  Hypokalemia: Replaced and adequate.     DVT prophylaxis: SCDs Start: 07/08/22 0839   Code Status: Full code Family Communication: None.  I offered to call her sister.  She tells me she will talk to her herself. Disposition Plan: Status is: Inpatient Remains inpatient appropriate because: Persistent abdominal pain     Consultants:  Gastroenterology  Procedures:  None  Antimicrobials:  None   Subjective: Patient seen and examined.  Slightly improved abdominal pain but still intermittently severe pain.  Denies any nausea or vomiting.  Tolerating clears.  She does not know when her pain comes.  Reluctant to mobilize.  Objective: Vitals:   07/10/22 0334 07/10/22 1533 07/10/22 1946 07/11/22 0601  BP: 119/65 137/75 136/73 135/75  Pulse: 94 99 98 99  Resp: 16 18 18 20   Temp: 98.2 F (36.8 C) 98.1 F (36.7 C) 97.8 F (36.6 C) 98.2 F (36.8 C)  TempSrc: Oral  Oral Oral  SpO2: 97% 99% 100% 99%  Weight:      Height:        Intake/Output Summary (Last 24 hours) at 07/11/2022 1110 Last data filed at 07/11/2022 0500 Gross per 24 hour  Intake 1538 ml  Output --  Net 1538 ml   Filed Weights   07/07/22 2209  Weight: 81.6 kg    Examination:  General exam: Appears comfortable at rest.  Anxious on exam. Frail  and debilitated.  On room air. Respiratory system: Clear to auscultation. Respiratory effort normal.  No added sounds. Cardiovascular system: S1 & S2 heard, RRR.  Gastrointestinal system: Soft.  Point tenderness at the epigastrium with palpable swelling that is firm and tender.  Mild diffuse tenderness at the epigastric area without rigidity or guarding. Central nervous system: Alert and oriented. No focal neurological  deficits. Extremities: Profound generalized weakness.    Data Reviewed: I have personally reviewed following labs and imaging studies  CBC: Recent Labs  Lab 07/07/22 2232 07/09/22 0513 07/11/22 0553  WBC 5.7 6.1 6.6  NEUTROABS 4.0  --  5.2  HGB 10.2* 8.9* 7.2*  HCT 31.6* 27.2* 22.3*  MCV 81.7 81.0 82.6  PLT 277 273 265   Basic Metabolic Panel: Recent Labs  Lab 07/07/22 2232 07/09/22 0513 07/10/22 0554  NA 138 141 140  K 2.7* 2.9* 4.2  CL 103 110 113*  CO2 19* 21* 17*  GLUCOSE 68* 73 71  BUN 6* 8 7*  CREATININE 0.80 0.75 0.55  CALCIUM 8.9 8.6* 8.3*  MG 2.0  --   --    GFR: Estimated Creatinine Clearance: 67 mL/min (by C-G formula based on SCr of 0.55 mg/dL). Liver Function Tests: Recent Labs  Lab 07/07/22 2232 07/09/22 0513 07/10/22 0554 07/11/22 0553  AST 14* 13* 10* 11*  ALT 9 8 7 7   ALKPHOS 86 83 74 69  BILITOT 3.1* 2.1* 1.6* 1.4*  PROT 6.0* 5.9* 5.2* 5.1*  ALBUMIN 2.9* 2.7* 2.6* 2.1*   Recent Labs  Lab 07/07/22 2232 07/09/22 0513 07/10/22 0554 07/11/22 0553  LIPASE 92* 480* 218* 140*   No results for input(s): "AMMONIA" in the last 168 hours. Coagulation Profile: No results for input(s): "INR", "PROTIME" in the last 168 hours. Cardiac Enzymes: No results for input(s): "CKTOTAL", "CKMB", "CKMBINDEX", "TROPONINI" in the last 168 hours. BNP (last 3 results) No results for input(s): "PROBNP" in the last 8760 hours. HbA1C: No results for input(s): "HGBA1C" in the last 72 hours. CBG: Recent Labs  Lab 07/08/22 0352  GLUCAP 87   Lipid Profile: No results for input(s): "CHOL", "HDL", "LDLCALC", "TRIG", "CHOLHDL", "LDLDIRECT" in the last 72 hours. Thyroid Function Tests: No results for input(s): "TSH", "T4TOTAL", "FREET4", "T3FREE", "THYROIDAB" in the last 72 hours. Anemia Panel: Recent Labs    07/09/22 1606  VITAMINB12 1,104*  FOLATE 5.9*  FERRITIN 269  TIBC 160*  IRON 80   Sepsis Labs: No results for input(s): "PROCALCITON",  "LATICACIDVEN" in the last 168 hours.  No results found for this or any previous visit (from the past 240 hour(s)).       Radiology Studies: No results found.      Scheduled Meds:  dorzolamide  1 drop Both Eyes BID   fesoterodine  4 mg Oral Daily   folic acid  1 mg Oral Daily   irbesartan  300 mg Oral Daily   latanoprost  1 drop Both Eyes QHS   pantoprazole (PROTONIX) IV  40 mg Intravenous Q12H   potassium chloride  40 mEq Oral BID   pregabalin  100 mg Oral TID   risperiDONE  1 mg Oral QHS   sertraline  100 mg Oral QHS   sucralfate  1 g Oral BID   venlafaxine XR  37.5 mg Oral Daily   Continuous Infusions:     LOS: 3 days    Time spent: 35 minutes    14/05/23, MD [facility] Pager [phone]

## 2022-07-11 NOTE — H&P (View-Only) (Signed)
Subjective: Feeling better.  Objective: Vital signs in last 24 hours: Temp:  [97.8 F (36.6 C)-98.2 F (36.8 C)] 98 F (36.7 C) (12/07 1443) Pulse Rate:  [93-99] 93 (12/07 1443) Resp:  [16-20] 16 (12/07 1443) BP: (105-136)/(64-75) 105/64 (12/07 1443) SpO2:  [99 %-100 %] 100 % (12/07 1443)    Intake/Output from previous day: 12/06 0701 - 12/07 0700 In: 1774 [P.O.:574; I.V.:1200] Out: -  Intake/Output this shift: Total I/O In: 472 [P.O.:472] Out: -   General appearance: alert and no distress GI: tender in the epigastric region  Lab Results: Recent Labs    07/09/22 0513 07/11/22 0553  WBC 6.1 6.6  HGB 8.9* 7.2*  HCT 27.2* 22.3*  PLT 273 265   BMET Recent Labs    07/09/22 0513 07/10/22 0554  NA 141 140  K 2.9* 4.2  CL 110 113*  CO2 21* 17*  GLUCOSE 73 71  BUN 8 7*  CREATININE 0.75 0.55  CALCIUM 8.6* 8.3*   LFT Recent Labs    07/11/22 0553  PROT 5.1*  ALBUMIN 2.1*  AST 11*  ALT 7  ALKPHOS 69  BILITOT 1.4*  BILIDIR 0.4*  IBILI 1.0*   PT/INR No results for input(s): "LABPROT", "INR" in the last 72 hours. Hepatitis Panel No results for input(s): "HEPBSAG", "HCVAB", "HEPAIGM", "HEPBIGM" in the last 72 hours. C-Diff No results for input(s): "CDIFFTOX" in the last 72 hours. Fecal Lactopherrin No results for input(s): "FECLLACTOFRN" in the last 72 hours.  Studies/Results: No results found.  Medications: Scheduled:  dorzolamide  1 drop Both Eyes BID   feeding supplement  1 Container Oral TID BM   fesoterodine  4 mg Oral Daily   folic acid  1 mg Oral Daily   irbesartan  300 mg Oral Daily   latanoprost  1 drop Both Eyes QHS   pantoprazole (PROTONIX) IV  40 mg Intravenous Q12H   potassium chloride  40 mEq Oral BID   pregabalin  100 mg Oral TID   risperiDONE  1 mg Oral QHS   sertraline  100 mg Oral QHS   sucralfate  1 g Oral BID   venlafaxine XR  37.5 mg Oral Daily   Continuous:  Assessment/Plan: 1) Acute pancreatitis. 2) Worsening  anemia. 3) History of an LA Grade B esophagitis. 4) Hyperbilirubinemia - improving.   The patient is improving clinically, however, she is still tender with palpation of her abdomen.  Further evaluation with an EGD will be performed as she had evidence of an esophagitis in 05/2022.  Plan: 1) EGD tomorrow. 2) Continue with supportive care. 3) Follow HGB and transfuse as necessary.  LOS: 3 days   Tyller Bowlby D 07/11/2022, 6:35 PM 

## 2022-07-11 NOTE — Progress Notes (Signed)
Hgb 7.2 vss no active bleeding noted on call notified

## 2022-07-11 NOTE — Evaluation (Signed)
Occupational Therapy Evaluation Patient Details Name: Stephanie Benton MRN: [id number] DOB: 1950/11/17 Today's Date: 07/11/2022   History of Present Illness [name] is a 71 yr old female admitted to the hospital with abdominal pain and emesis. PMH: HLD, recurrent pancreatitis, L spine spondylosis, HTN, anxiety, bipolar disorder   Clinical Impression   Patient reported having mild abdominal pain at rest and occasional nausea. She performed supine to sit with supervision, then lower body dressing with mod assist seated EOB. Upon performing sit to stand using a RW, she presented with dizziness, lightheadedness, and feeling "woozy." She subsequently requested to return to sitting, with her symptoms improving after a short duration. She was further instructed on lateral scooting to the head of the bed, for which she performed with min guard assist. At current, she would not be safe to return home alone, as she would at high risk for falls and restricted ADL participation, therefore short-term SNF rehab is recommended. OT anticipates her overall ADL performance would improve significantly, shoulder her current symptoms resolve or improve. She stated she desires to return home with home therapy at discharge instead, even though she does not have anyone who could stay with her initially. OT will continue to follow her for further services to maximize her safety and independence with self-care tasks.      Recommendations for follow up therapy are one component of a multi-disciplinary discharge planning process, led by the attending physician.  Recommendations may be updated based on patient status, additional functional criteria and insurance authorization.   Follow Up Recommendations  Skilled nursing-short term rehab (<3 hours/day)     Assistance Recommended at Discharge Frequent or constant Supervision/Assistance  Patient can return home with the following Assist for transportation;Assistance with  cooking/housework;A lot of help with bathing/dressing/bathroom    Functional Status Assessment  Patient has had a recent decline in their functional status and demonstrates the ability to make significant improvements in function in a reasonable and predictable amount of time.  Equipment Recommendations  None recommended by OT       Precautions / Restrictions Precautions Precautions: Fall Restrictions Weight Bearing Restrictions: No Other Position/Activity Restrictions: she reports of history of multiple falls      Mobility Bed Mobility Overal bed mobility: Needs Assistance Bed Mobility: Supine to Sit     Supine to sit: HOB elevated, Supervision          Transfers Overall transfer level: Needs assistance Equipment used: Rolling walker (2 wheels) Transfers: Sit to/from Stand Sit to Stand: Min guard           General transfer comment: required steadying assist & verbal cue to push up from the bed with at least 1 uppper extremity      Balance Overall balance assessment: Needs assistance   Sitting balance-Leahy Scale: Good         Standing balance comment: min guard for static standing using a RW           ADL either performed or assessed with clinical judgement   ADL Overall ADL's : Needs assistance/impaired Eating/Feeding: Independent;Sitting   Grooming: Set up;Sitting Grooming Details (indicate cue type and reason): EOB         Upper Body Dressing : Set up;Sitting Upper Body Dressing Details (indicate cue type and reason): simulated seated EOB Lower Body Dressing: Moderate assistance Lower Body Dressing Details (indicate cue type and reason): seated EOB             Vision Patient Visual Report:  No change from baseline Additional Comments: she correctly read the time depicted on the wall clock            Pertinent Vitals/Pain Pain Assessment Pain Assessment: 0-10 Pain Score: 3  Pain Location: abdomen Pain Intervention(s): Limited  activity within patient's tolerance     Hand Dominance Right   Extremity/Trunk Assessment Upper Extremity Assessment Upper Extremity Assessment: Overall WFL for tasks assessed   Lower Extremity Assessment Lower Extremity Assessment: Overall WFL for tasks assessed       Communication Communication Communication: No difficulties   Cognition   Behavior During Therapy: WFL for tasks assessed/performed Overall Cognitive Status: Within Functional Limits for tasks assessed          General Comments: Oriented x4, able to follow commands consistently                Home Living Family/patient expects to be discharged to:: Private residence Living Arrangements: Alone Available Help at Discharge: Family;Available PRN/intermittently Type of Home: Apartment Home Access: Level entry     Home Layout: One level     Bathroom Shower/Tub: Tub/shower unit         Home Equipment: Pharmacist, hospital (2 wheels);BSC/3in1 (Hurrycane)          Prior Functioning/Environment Prior Level of Function : Independent/Modified Independent             Mobility Comments: Use of hurrycane for mobility inside & RW when outside. She reports having several falls.  ADLs Comments: Modified Independent to independent with ADLs and household IADLs. She reported driving "occasionally."        OT Problem List: Decreased strength;Decreased activity tolerance;Impaired balance (sitting and/or standing);Decreased knowledge of use of DME or AE;Pain      OT Treatment/Interventions: Self-care/ADL training;Therapeutic activities;Therapeutic exercise;Energy conservation;Patient/family education;DME and/or AE instruction;Balance training    OT Goals(Current goals can be found in the care plan section) Acute Rehab OT Goals Patient Stated Goal: to return home at discharge OT Goal Formulation: With patient Time For Goal Achievement: 07/25/22 Potential to Achieve Goals: Good ADL Goals Pt Will  Perform Grooming: with supervision;standing Pt Will Perform Lower Body Dressing: with supervision;sit to/from stand Pt Will Transfer to Toilet: with supervision;ambulating Pt Will Perform Toileting - Clothing Manipulation and hygiene: with supervision;sit to/from stand  OT Frequency: Min 2X/week       AM-PAC OT "6 Clicks" Daily Activity     Outcome Measure Help from another person eating meals?: None Help from another person taking care of personal grooming?: A Little Help from another person toileting, which includes using toliet, bedpan, or urinal?: A Lot Help from another person bathing (including washing, rinsing, drying)?: A Lot Help from another person to put on and taking off regular upper body clothing?: None Help from another person to put on and taking off regular lower body clothing?: A Lot 6 Click Score: 17   End of Session Equipment Utilized During Treatment: Rolling walker (2 wheels) Nurse Communication: Mobility status  Activity Tolerance: Other (comment) (Limited by dizziness and lightheadedness upon standing) Patient left: in bed;with call bell/phone within reach  OT Visit Diagnosis: Unsteadiness on feet (R26.81);Pain                Time: 2458-0998 OT Time Calculation (min): 18 min Charges:  OT General Charges $OT Visit: 1 Visit OT Evaluation $OT Eval Moderate Complexity: 1 Mod    Miosotis Wetsel L Marquel Spoto, OTR/L 07/11/2022, 12:13 PM

## 2022-07-11 NOTE — Progress Notes (Signed)
Initial Nutrition Assessment  DOCUMENTATION CODES:   Not applicable  INTERVENTION:   If unable to advance pt's diet past clear liquids within next 48 hours, recommend placement of small-bore feeding tube and initiation of enteral nutrition given pt's history of malnutrition. Recommend: - Start Osmolite 1.5 @ 20 ml/hr and advance by 10 ml q 8 hours to goal rate of 60 ml/hr (1440 ml/day) - PROSource TF20 60 ml daily  Tube feeding regimen provides 2240 kcal, 110 grams of protein, and 1097 ml of H2O.    - Boost Breeze po TID, each supplement provides 250 kcal and 9 grams of protein  - Folic acid 1 mg daily x 30 days given low folate  - Given recurrent pancreatitis and history of malnutrition, checking vitamin A, vitamin C, vitamin D, vitamin E, vitamin K, zinc, thiamine, and CRP levels   NUTRITION DIAGNOSIS:   Inadequate oral intake related to altered GI function as evidenced by other (clear liquid diet order).  GOAL:   Patient will meet greater than or equal to 90% of their needs  MONITOR:   PO intake, Supplement acceptance, Diet advancement, Labs, Weight trends  REASON FOR ASSESSMENT:   Malnutrition Screening Tool    ASSESSMENT:   71 year old female who presented to the ED on 12/04 with abdominal pain, emesis. PMH of HLD, recurrent pancreatitis, chronic abdominal pain, lumbar spine spondylosis, metabolic acidosis, HTN, HLD, depression, anxiety, bipolar 1 disorder, vitamin B12 deficiency, COPD, tobacco abuse, prior EtOH abuse. Pt admitted with acute on chronic pancreatitis.  12/05 - clear liquid diet  RD working remotely. Per notes, pt with multiple episodes of emesis that started to occur after she ate some [place] fries on Thursday of last week. CT scan abdomen/pelvis showed cystitis versus bladder distention along with a large hiatal hernia. Pt has been on a clear liquid diet since 12/05. No meal completions documented.  Reviewed RD note from 06/25/22 from previous  admission. At that time, pt met criteria for moderate malnutrition in context of chronic illness. Suspect malnutrition persists but unable to confirm at this time without NFPE.  Attempted to speak with pt via phone call to room; however, no answer.  Reviewed weight history in chart. If weights are accurate, pt has experienced a 21.4 kg weight loss from 05/09/22 to 07/07/22. This is a 20.8% weight loss in 2 months which is severe and significant for timeframe. Unsure of accuracy of weights given weight on admission appears to be stated rather than measured.  Medications reviewed and include: IV protonix, klor-con 40 mEq BID, sucralfate  Vitamin/Mineral Profile: Thiamine B1: pending Vitamin B12: 1104 (high) Folate B9: 5.9 (low) Vitamin A: pending Vitamin C:  pending Vitamin D: pending Vitamin E: pending Vitamin K: pending Zinc: pending CRP: pending  Labs reviewed: lipase 140 (trending down), hemoglobin 7.2  NUTRITION - FOCUSED PHYSICAL EXAM:  Unable to complete at this time. RD working remotely.  Diet Order:   Diet Order             Diet NPO time specified  Diet effective midnight           Diet clear liquid Room service appropriate? Yes; Fluid consistency: Thin  Diet effective now                   EDUCATION NEEDS:   Not appropriate for education at this time  Skin:  Skin Assessment: Reviewed RN Assessment  Last BM:  no documented BM  Height:   Ht Readings from Last 1  Encounters:  07/07/22 _0  (1.651 m)    Weight:   Wt Readings from Last 1 Encounters:  07/07/22 81.6 kg    Ideal Body Weight:  56.8 kg  BMI:  Body mass index is 29.95 kg/m.  Estimated Nutritional Needs:   Kcal:  2150-2350  Protein:  100-120 grams  Fluid:  >2.0 L    Gustavus Bryant, MS, RD, LDN Inpatient Clinical Dietitian Please see AMiON for contact information.

## 2022-07-12 ENCOUNTER — Encounter (HOSPITAL_COMMUNITY): Payer: Self-pay

## 2022-07-12 ENCOUNTER — Inpatient Hospital Stay (HOSPITAL_COMMUNITY): Payer: Medicare Other | Admitting: Anesthesiology

## 2022-07-12 ENCOUNTER — Encounter (HOSPITAL_COMMUNITY)
Admission: EM | Disposition: A | Discharge status: Home Health | Payer: Self-pay | Source: Home / Self Care | Attending: Internal Medicine

## 2022-07-12 DIAGNOSIS — K859 Acute pancreatitis without necrosis or infection, unspecified: Secondary | ICD-10-CM | POA: Diagnosis not present

## 2022-07-12 DIAGNOSIS — K2100 Gastro-esophageal reflux disease with esophagitis, without bleeding: Secondary | ICD-10-CM

## 2022-07-12 DIAGNOSIS — K449 Diaphragmatic hernia without obstruction or gangrene: Secondary | ICD-10-CM

## 2022-07-12 DIAGNOSIS — J449 Chronic obstructive pulmonary disease, unspecified: Secondary | ICD-10-CM

## 2022-07-12 DIAGNOSIS — F17210 Nicotine dependence, cigarettes, uncomplicated: Secondary | ICD-10-CM

## 2022-07-12 DIAGNOSIS — K861 Other chronic pancreatitis: Secondary | ICD-10-CM | POA: Diagnosis not present

## 2022-07-12 HISTORY — PX: ESOPHAGOGASTRODUODENOSCOPY (EGD) WITH PROPOFOL: SHX5813

## 2022-07-12 LAB — HEPATIC FUNCTION PANEL
ALT: 8 U/L (ref 0–44)
AST: 10 U/L — ABNORMAL LOW (ref 15–41)
Albumin: 2.4 g/dL — ABNORMAL LOW (ref 3.5–5.0)
Alkaline Phosphatase: 79 U/L (ref 38–126)
Bilirubin, Direct: 0.6 mg/dL — ABNORMAL HIGH (ref 0.0–0.2)
Indirect Bilirubin: 0.7 mg/dL (ref 0.3–0.9)
Total Bilirubin: 1.3 mg/dL — ABNORMAL HIGH (ref 0.3–1.2)
Total Protein: 5.3 g/dL — ABNORMAL LOW (ref 6.5–8.1)

## 2022-07-12 LAB — LIPASE, BLOOD: Lipase: 105 U/L — ABNORMAL HIGH (ref 11–51)

## 2022-07-12 LAB — HEMOGLOBIN AND HEMATOCRIT, BLOOD
HCT: 24.5 % — ABNORMAL LOW (ref 36.0–46.0)
Hemoglobin: 8 g/dL — ABNORMAL LOW (ref 12.0–15.0)

## 2022-07-12 SURGERY — ESOPHAGOGASTRODUODENOSCOPY (EGD) WITH PROPOFOL
Anesthesia: Monitor Anesthesia Care

## 2022-07-12 MED ORDER — PHENYLEPHRINE HCL (PRESSORS) 10 MG/ML IV SOLN
INTRAVENOUS | Status: DC | PRN
  Administered 2022-07-12: 160 ug via INTRAVENOUS

## 2022-07-12 MED ORDER — PROPOFOL 500 MG/50ML IV EMUL
INTRAVENOUS | Status: DC | PRN
  Administered 2022-07-12: 100 ug/kg/min via INTRAVENOUS

## 2022-07-12 MED ORDER — PROPOFOL 10 MG/ML IV BOLUS
INTRAVENOUS | Status: DC | PRN
  Administered 2022-07-12: 50 mg via INTRAVENOUS

## 2022-07-12 MED ORDER — LIDOCAINE HCL (CARDIAC) PF 100 MG/5ML IV SOSY
PREFILLED_SYRINGE | INTRAVENOUS | Status: DC | PRN
  Administered 2022-07-12: 100 mg via INTRAVENOUS

## 2022-07-12 SURGICAL SUPPLY — 15 items

## 2022-07-12 NOTE — Progress Notes (Signed)
PROGRESS NOTE    Stephanie Benton  KYH:[id number] DOB: 09/03/1950 DOA: 07/07/2022 PCP: Sharmon Revere, MD    Brief Narrative:  71 year old female with history of hypertension hyperlipidemia, chronic abdominal pain, anxiety and bipolar 1 disorder who presented to the emergency room with abdominal pain associated with multiple episodes of emesis apparently after eating some [place] fries.  In the emergency room on room air.  Lipase 92.  CT scan with bladder distention, large hiatal hernia, aortic and coronary artery atherosclerosis.  7 x 5 cm chronic fluid collection associated with ventral mesh hernia repair on abdominal wall.  Subsequently her lipase level went up.  Admitted with acute on chronic abdominal pain.   Assessment & Plan:   Acute on chronic pancreatitis:  Acute on chronic abdominal pain.  Painful epigastric lump.   With history of pancreatitis in the past.  No alcohol use.  Cholecystectomy.   Some clinical improvement since last 24 hours.  Will advance to full liquid diet after the procedure today.  Tramadol dose increased to 4 times daily.  She is also on Lyrica, will resume.  minimize use of IV pain medications.  Lipase level is trending down.  CT scan with no evidence of pancreatitis. Wonder all her pain is related to ventral hernia repair and fluid collection on her epigastrium.  Symptomatic treatment. Continue with IV PPI and sucralfate.  Chronic pain syndrome: As above.  Lyrica and increased dose of tramadol.  Better controlled today.  Acute on chronic nutritional anemia: Iron and ferritin levels are adequate.  B12 level is adequate.  Folic acid level is 5.9 and will start replacement. History of severe esophagitis with bleeding.  On PPI.  Hemoglobin drifted down from 11-7.2-8.  No evidence of active bleeding.  Currently no indication for transfusion. Underwent repeat EGD today and found to have severe esophagitis but no active bleeding.  May be contributing to her  pain. PPI and sucralfate.  Depression and anxiety: Patient on risperidone, sertraline, venlafaxine that is continued.  Hypokalemia: Replaced and adequate.     DVT prophylaxis: SCDs Start: 07/08/22 0839   Code Status: Full code Family Communication: None.  I offered to call her sister.  She tells me she will talk to her herself. Disposition Plan: Status is: Inpatient Remains inpatient appropriate because: Persistent abdominal pain     Consultants:  Gastroenterology  Procedures:  Upper GI endoscopy 12/8.  Antimicrobials:  None   Subjective: Patient seen and examined in the morning rounds before the procedure.  She tells me she is doing fine.  She had episode of vomiting while working with physical therapist yesterday but denies any further nausea.  She still gets occasional epigastric pain but better than before.  Objective: Vitals:   07/12/22 1010 07/12/22 1020 07/12/22 1030 07/12/22 1054  BP: (!) 98/59 (!) 126/59 126/73 136/80  Pulse: (!) 116 (!) 110 (!) 109 100  Resp: 19 19 18 18   Temp: 97.8 F (36.6 C)   98.2 F (36.8 C)  TempSrc: Temporal   Oral  SpO2: 96% 96% 97% 100%  Weight:      Height:        Intake/Output Summary (Last 24 hours) at 07/12/2022 1133 Last data filed at 07/12/2022 1006 Gross per 24 hour  Intake 1001.53 ml  Output --  Net 1001.53 ml   Filed Weights   07/07/22 2209  Weight: 81.6 kg    Examination:  General exam: Anxious.  Otherwise comfortable. Frail and debilitated.  On room air. Respiratory system:  Clear to auscultation. Respiratory effort normal.  No added sounds. Cardiovascular system: S1 & S2 heard, RRR.  Gastrointestinal system: Soft.  Point tenderness at the epigastrium with palpable swelling that is firm and tender.  Mild diffuse tenderness at the epigastric area without rigidity or guarding. Central nervous system: Alert and oriented. No focal neurological deficits. Extremities: Profound generalized weakness.    Data  Reviewed: I have personally reviewed following labs and imaging studies  CBC: Recent Labs  Lab 07/07/22 2232 07/09/22 0513 07/11/22 0553 07/12/22 0556  WBC 5.7 6.1 6.6  --   NEUTROABS 4.0  --  5.2  --   HGB 10.2* 8.9* 7.2* 8.0*  HCT 31.6* 27.2* 22.3* 24.5*  MCV 81.7 81.0 82.6  --   PLT 277 273 265  --    Basic Metabolic Panel: Recent Labs  Lab 07/07/22 2232 07/09/22 0513 07/10/22 0554  NA 138 141 140  K 2.7* 2.9* 4.2  CL 103 110 113*  CO2 19* 21* 17*  GLUCOSE 68* 73 71  BUN 6* 8 7*  CREATININE 0.80 0.75 0.55  CALCIUM 8.9 8.6* 8.3*  MG 2.0  --   --    GFR: Estimated Creatinine Clearance: 67 mL/min (by C-G formula based on SCr of 0.55 mg/dL). Liver Function Tests: Recent Labs  Lab 07/07/22 2232 07/09/22 0513 07/10/22 0554 07/11/22 0553 07/12/22 0556  AST 14* 13* 10* 11* 10*  ALT 9 8 7 7 8   ALKPHOS 86 83 74 69 79  BILITOT 3.1* 2.1* 1.6* 1.4* 1.3*  PROT 6.0* 5.9* 5.2* 5.1* 5.3*  ALBUMIN 2.9* 2.7* 2.6* 2.1* 2.4*   Recent Labs  Lab 07/07/22 2232 07/09/22 0513 07/10/22 0554 07/11/22 0553 07/12/22 0556  LIPASE 92* 480* 218* 140* 105*   No results for input(s): "AMMONIA" in the last 168 hours. Coagulation Profile: No results for input(s): "INR", "PROTIME" in the last 168 hours. Cardiac Enzymes: No results for input(s): "CKTOTAL", "CKMB", "CKMBINDEX", "TROPONINI" in the last 168 hours. BNP (last 3 results) No results for input(s): "PROBNP" in the last 8760 hours. HbA1C: No results for input(s): "HGBA1C" in the last 72 hours. CBG: Recent Labs  Lab 07/08/22 0352  GLUCAP 87   Lipid Profile: No results for input(s): "CHOL", "HDL", "LDLCALC", "TRIG", "CHOLHDL", "LDLDIRECT" in the last 72 hours. Thyroid Function Tests: No results for input(s): "TSH", "T4TOTAL", "FREET4", "T3FREE", "THYROIDAB" in the last 72 hours. Anemia Panel: Recent Labs    07/09/22 1606  VITAMINB12 1,104*  FOLATE 5.9*  FERRITIN 269  TIBC 160*  IRON 80   Sepsis Labs: No  results for input(s): "PROCALCITON", "LATICACIDVEN" in the last 168 hours.  No results found for this or any previous visit (from the past 240 hour(s)).       Radiology Studies: No results found.      Scheduled Meds:  dorzolamide  1 drop Both Eyes BID   feeding supplement  1 Container Oral TID BM   fesoterodine  4 mg Oral Daily   folic acid  1 mg Oral Daily   irbesartan  300 mg Oral Daily   latanoprost  1 drop Both Eyes QHS   pantoprazole (PROTONIX) IV  40 mg Intravenous Q12H   potassium chloride  40 mEq Oral BID   pregabalin  100 mg Oral TID   risperiDONE  1 mg Oral QHS   sertraline  100 mg Oral QHS   sucralfate  1 g Oral BID   venlafaxine XR  37.5 mg Oral Daily   Continuous Infusions:  LOS: 4 days    Time spent: 35 minutes    Barb Merino, MD [facility] Pager [phone]

## 2022-07-12 NOTE — Interval H&P Note (Signed)
History and Physical Interval Note:  07/12/2022 9:47 AM  Stephanie Benton  has presented today for surgery, with the diagnosis of Anemia.  The various methods of treatment have been discussed with the patient and family. After consideration of risks, benefits and other options for treatment, the patient has consented to  Procedure(s): ESOPHAGOGASTRODUODENOSCOPY (EGD) WITH PROPOFOL (N/A) as a surgical intervention.  The patient's history has been reviewed, patient examined, no change in status, stable for surgery.  I have reviewed the patient's chart and labs.  Questions were answered to the patient's satisfaction.     Jonelle Bann D

## 2022-07-12 NOTE — Anesthesia Preprocedure Evaluation (Addendum)
Anesthesia Evaluation  Patient identified by MRN, date of birth, ID band Patient awake    Reviewed: Allergy & Precautions, NPO status , Patient's Chart, lab work & pertinent test results  Airway Mallampati: III  TM Distance: >3 FB Neck ROM: Full    Dental  (+) Edentulous Upper, Edentulous Lower   Pulmonary COPD,  COPD inhaler, Current Smoker and Patient abstained from smoking.   Pulmonary exam normal breath sounds clear to auscultation       Cardiovascular hypertension, Normal cardiovascular exam Rhythm:Regular Rate:Normal  TTE 2023 1. Left ventricular ejection fraction, by estimation, is 60 to 65%. The  left ventricle has normal function. The left ventricle has no regional  wall motion abnormalities. There is mild left ventricular hypertrophy.  Left ventricular diastolic parameters  are consistent with Grade I diastolic dysfunction (impaired relaxation).   2. Right ventricular systolic function is normal. The right ventricular  size is normal.   3. The mitral valve is normal in structure. No evidence of mitral valve  regurgitation.   4. There is mild calcification of the aortic valve. Aortic valve  regurgitation is not visualized.   5. The inferior vena cava is normal in size with greater than 50%  respiratory variability, suggesting right atrial pressure of 3 mmHg.     Neuro/Psych  PSYCHIATRIC DISORDERS Anxiety Depression Bipolar Disorder   negative neurological ROS     GI/Hepatic negative GI ROS, Neg liver ROS,,,  Endo/Other  negative endocrine ROS    Renal/GU negative Renal ROS  negative genitourinary   Musculoskeletal  (+) Arthritis ,    Abdominal   Peds  Hematology  (+) Blood dyscrasia, anemia Lab Results      Component                Value               Date                      WBC                      6.6                 07/11/2022                HGB                      7.2 (L)             07/11/2022                 HCT                      22.3 (L)            07/11/2022                MCV                      82.6                07/11/2022                PLT                      265                 07/11/2022  Anesthesia Other Findings   Reproductive/Obstetrics                             Anesthesia Physical Anesthesia Plan  ASA: 3  Anesthesia Plan: MAC   Post-op Pain Management:    Induction: Intravenous  PONV Risk Score and Plan: Propofol infusion and Treatment may vary due to age or medical condition  Airway Management Planned: Natural Airway  Additional Equipment:   Intra-op Plan:   Post-operative Plan:   Informed Consent: I have reviewed the patients History and Physical, chart, labs and discussed the procedure including the risks, benefits and alternatives for the proposed anesthesia with the patient or authorized representative who has indicated his/her understanding and acceptance.     Dental advisory given  Plan Discussed with: CRNA  Anesthesia Plan Comments:        Anesthesia Quick Evaluation

## 2022-07-12 NOTE — Op Note (Signed)
[facility] Patient Name: Stephanie Benton Procedure Date: 07/12/2022 MRN: [id number] Attending MD: Jeani Hawking , MD, [id number] Date of Birth: May 14, 1951 CSN: [id number] Age: 71 Admit Type: Inpatient Procedure:                Upper GI endoscopy Indications:              Epigastric abdominal pain Providers:                Jeani Hawking, MD, Zoe Lan, RN, Sunday Corn                            [place], Technician Referring MD:              Medicines:                Propofol per Anesthesia Complications:            No immediate complications. Estimated Blood Loss:     Estimated blood loss: none. Procedure:                Pre-Anesthesia Assessment:                           - Prior to the procedure, a History and Physical                            was performed, and patient medications and                            allergies were reviewed. The patient's tolerance of                            previous anesthesia was also reviewed. The risks                            and benefits of the procedure and the sedation                            options and risks were discussed with the patient.                            All questions were answered, and informed consent                            was obtained. Prior Anticoagulants: The patient has                            taken no anticoagulant or antiplatelet agents. ASA                            Grade Assessment: III - A patient with severe                            systemic disease. After reviewing the risks and  benefits, the patient was deemed in satisfactory                            condition to undergo the procedure.                           - Sedation was administered by an anesthesia                            professional. Deep sedation was attained.                           After obtaining informed consent, the endoscope was                            passed under direct vision.  Throughout the                            procedure, the patient's blood pressure, pulse, and                            oxygen saturations were monitored continuously. The                            [id number]-[id number] ([id number] Olympus endoscope was introduced                            through the mouth, and advanced to the second part                            of duodenum. The upper GI endoscopy was                            accomplished without difficulty. The patient                            tolerated the procedure well. Scope In: Scope Out: Findings:      LA Grade D (one or more mucosal breaks involving at least 75% of       esophageal circumference) esophagitis with no bleeding was found in the       lower third of the esophagus.      A 3 cm hiatal hernia was present.      The stomach was normal.      The examined duodenum was normal.      Technically the patient's esophagitis is classified as an LA Grade D,       but it did not appear to be severe. There was no evidence of any       bleeding site in the upper GI tract. Impression:               - LA Grade D reflux esophagitis with no bleeding.                           - 3 cm hiatal hernia.                           -  Normal stomach.                           - Normal examined duodenum.                           - No specimens collected. Moderate Sedation:      Not Applicable - Patient had care per Anesthesia. Recommendation:           - Patient has a contact number available for                            emergencies. The signs and symptoms of potential                            delayed complications were discussed with the                            patient. Return to normal activities tomorrow.                            Written discharge instructions were provided to the                            patient.                           - Resume previous diet.                           - Continue present medications.                            - Trial of sucralfate.                           - Follow HGB and transfuse if necessary. Procedure Code(s):        --- Professional ---                           [phone], Esophagogastroduodenoscopy, flexible,                            transoral; diagnostic, including collection of                            specimen(s) by brushing or washing, when performed                            (separate procedure) Diagnosis Code(s):        --- Professional ---                           K21.00, Gastro-esophageal reflux disease with                            esophagitis, without bleeding  K44.9, Diaphragmatic hernia without obstruction or                            gangrene                           R10.13, Epigastric pain CPT copyright 2022 [employer]. All rights reserved. The codes documented in this report are preliminary and upon coder review may  be revised to meet current compliance requirements. Jeani Hawking, MD Jeani Hawking, MD 07/12/2022 10:13:44 AM This report has been signed electronically. Number of Addenda: 0

## 2022-07-12 NOTE — Transfer of Care (Signed)
Immediate Anesthesia Transfer of Care Note  Patient: MONAY HOULTON  Procedure(s) Performed: ESOPHAGOGASTRODUODENOSCOPY (EGD) WITH PROPOFOL  Patient Location: Short Stay  Anesthesia Type:MAC  Level of Consciousness: awake, alert , and patient cooperative  Airway & Oxygen Therapy: Patient Spontanous Breathing  Post-op Assessment: Report given to RN, Post -op Vital signs reviewed and stable, and Patient moving all extremities X 4  Post vital signs: Reviewed and stable  Last Vitals:  Vitals Value Taken Time  BP    Temp    Pulse 117 07/12/22 1011  Resp 19 07/12/22 1011  SpO2 95 % 07/12/22 1011  Vitals shown include unvalidated device data.  Last Pain:  Vitals:   07/12/22 0910  TempSrc: Temporal  PainSc: 7       Patients Stated Pain Goal: 0 (69/45/03 8882)  Complications: No notable events documented.

## 2022-07-13 DIAGNOSIS — K861 Other chronic pancreatitis: Secondary | ICD-10-CM | POA: Diagnosis not present

## 2022-07-13 DIAGNOSIS — K859 Acute pancreatitis without necrosis or infection, unspecified: Secondary | ICD-10-CM | POA: Diagnosis not present

## 2022-07-13 LAB — HEPATIC FUNCTION PANEL
ALT: 7 U/L (ref 0–44)
AST: 9 U/L — ABNORMAL LOW (ref 15–41)
Albumin: 2.3 g/dL — ABNORMAL LOW (ref 3.5–5.0)
Alkaline Phosphatase: 65 U/L (ref 38–126)
Bilirubin, Direct: 0.6 mg/dL — ABNORMAL HIGH (ref 0.0–0.2)
Indirect Bilirubin: 0.7 mg/dL (ref 0.3–0.9)
Total Bilirubin: 1.3 mg/dL — ABNORMAL HIGH (ref 0.3–1.2)
Total Protein: 4.8 g/dL — ABNORMAL LOW (ref 6.5–8.1)

## 2022-07-13 LAB — VITAMIN C: Vitamin C: <0.1 mg/dL — ABNORMAL LOW (ref 0.4–2.0)

## 2022-07-13 LAB — LIPASE, BLOOD: Lipase: 100 U/L — ABNORMAL HIGH (ref 11–51)

## 2022-07-13 MED ORDER — FOLIC ACID 1 MG PO TABS: 1.0000 mg | ORAL_TABLET | Freq: Every day | ORAL | 0 refills | Status: AC

## 2022-07-13 MED ORDER — ONDANSETRON HCL 4 MG PO TABS: 4.0000 mg | ORAL_TABLET | Freq: Three times a day (TID) | ORAL | 0 refills | Status: AC | PRN

## 2022-07-13 NOTE — Discharge Summary (Signed)
Physician Discharge Summary  RAMANI RIVA BJY:[id number] DOB: October 18, 1950 DOA: 07/07/2022  PCP: Sharmon Revere, MD  Admit date: 07/07/2022 Discharge date: 07/13/2022  Admitted From: Home Disposition: Home with home health  Recommendations for Outpatient Follow-up:  Follow up with PCP in 1-2 weeks Please obtain CMP/CBC/magnesium/phosphorus in one week   Home Health: PT/OT Equipment/Devices: Available at home  Discharge Condition: Fair CODE STATUS: Full code Diet recommendation: Low-salt diet, aspiration precautions  Discharge summary: 71 year old female with history of hypertension, hyperlipidemia, chronic abdominal pain, anxiety and bipolar 1 disorder who presented to the emergency room with abdominal pain associated with multiple episodes of emesis apparently after eating some [place] fries.  In the emergency room on room air.  Lipase 92.  CT scan with bladder distention, large hiatal hernia, aortic and coronary artery atherosclerosis.  7 x 5 cm chronic fluid collection associated with ventral mesh hernia repair on abdominal wall. Subsequently her lipase level went up.  Admitted with acute on chronic abdominal pain.     Assessment & Plan:   Acute on chronic abdominal pain, chronic pain syndrome on Lyrica and tramadol.   Multifactorial.   She developed acute on chronic pancreatitis, treated with IV fluids and now tolerating soft food and liquid diet.  Pain is controlled on oral tramadol.  No use of alcohol.  Patient had gallbladder removal 2 months ago. Patient also has painful lump on her epigastrium which is probably due to postoperative fluid collection from her ventral hernia mesh repair.  This is tender on palpation, however without any evidence of infection.   Patient also has significant reflux esophagitis, status post EGD with no acute bleeding.    Plan:  Tramadol 50 mg 3 times a day as needed, Lyrica as prescribed by her pain management clinic.   Full liquid diet and soft  diet as tolerated.   High risk of readmission as she may quickly decompensate her pain syndrome due to above factors. She will continue PPI, sucralfate, reflux precautions discussed.  Pain management as above.  Chronic pain syndrome: As above.  Lyrica and increased dose of tramadol.  Better controlled today.   Acute on chronic nutritional anemia: Iron and ferritin levels are adequate.  B12 level is adequate.  Folic acid level is 5.9 and will start replacement.   Depression and anxiety: Patient on risperidone, sertraline that is continued.   Hypokalemia: Replaced and adequate.  High risk of readmission.  Stable to discharge today.    Discharge Diagnoses:  Principal Problem:   Acute on chronic pancreatitis (HCC) Active Problems:   Hypertension   Depression with anxiety   Hypercholesterolemia   Esophagitis determined by endoscopy   LFTs abnormal   Hypokalemia   COPD (chronic obstructive pulmonary disease) (HCC)   Glaucoma    Discharge Instructions  Discharge Instructions     Call MD for:  persistant nausea and vomiting   Complete by: As directed    Call MD for:  severe uncontrolled pain   Complete by: As directed    Diet - low sodium heart healthy   Complete by: As directed    Stay on soft diet and liquid diet   Increase activity slowly   Complete by: As directed       Allergies as of 07/13/2022       Reactions   Fish-derived Products Anaphylaxis   Morphine And Related Anaphylaxis, Swelling   Belsomra [suvorexant] Other (See Comments)   Caused nightmares   Codeine Nausea And Vomiting   Fentanyl Hives  Ibuprofen Nausea Only, Other (See Comments)   Severe nausea- history of stomach ulcers   Nsaids Other (See Comments)   History of stomach ulcers   Other Other (See Comments)   History of stomach ulcers   Penicillins Hives   Did it involve swelling of the face/tongue/throat, SOB, or low BP? Yes Did it involve sudden or severe rash/hives, skin peeling, or any  reaction on the inside of your mouth or nose? Yes Did you need to seek medical attention at a hospital or doctor's office? Yes When did it last happen? More than 10 years ago If all above answers are "NO", may proceed with cephalosporin use.   Sulfa Antibiotics Swelling   Site of swelling not recalled   Tolmetin Other (See Comments)   History of stomach ulcers        Medication List     STOP taking these medications    Potassium Chloride 10 MEQ Pack   potassium chloride 10 MEQ tablet Commonly known as: KLOR-CON   venlafaxine XR 37.5 MG 24 hr capsule Commonly known as: EFFEXOR-XR       TAKE these medications    Ventolin HFA 108 (90 Base) MCG/ACT inhaler Generic drug: albuterol Inhale 2 puffs into the lungs every 6 (six) hours as needed for wheezing or shortness of breath.   albuterol (2.5 MG/3ML) 0.083% nebulizer solution Commonly known as: PROVENTIL Take 3 mLs (2.5 mg total) by nebulization every 6 (six) hours as needed for wheezing or shortness of breath.   dexlansoprazole 60 MG capsule Commonly known as: DEXILANT Take 60 mg by mouth daily.   diclofenac sodium 1 % Gel Commonly known as: VOLTAREN Apply 2 g topically daily as needed (for knee pain).   dorzolamide 2 % ophthalmic solution Commonly known as: TRUSOPT Place 1 drop into both eyes 2 (two) times daily.   DULoxetine HCl 40 MG Cpep Take 40 mg by mouth daily.   feeding supplement Liqd Take 237 mLs by mouth 2 (two) times daily between meals. What changed: when to take this   FeroSul 325 (65 FE) MG tablet Generic drug: ferrous sulfate Take 325 mg by mouth daily with breakfast.   folic acid 1 MG tablet Commonly known as: FOLVITE Take 1 tablet (1 mg total) by mouth daily. Start taking on: July 14, 2022   latanoprost 0.005 % ophthalmic solution Commonly known as: XALATAN Place 1 drop into both eyes at bedtime.   ondansetron 4 MG tablet Commonly known as: ZOFRAN Take 1 tablet (4 mg total) by  mouth every 8 (eight) hours as needed for up to 14 days for nausea or vomiting. What changed:  when to take this reasons to take this   polyethylene glycol powder 17 GM/SCOOP powder Commonly known as: GLYCOLAX/MIRALAX Take 1 cap full (17 g) with water by mouth daily. What changed:  when to take this reasons to take this   pregabalin 100 MG capsule Commonly known as: LYRICA Take 1 capsule (100 mg total) by mouth 3 (three) times daily.   risperiDONE 1 MG tablet Commonly known as: RISPERDAL Take 1 mg by mouth at bedtime.   sertraline 100 MG tablet Commonly known as: ZOLOFT Take 100 mg by mouth at bedtime.   simvastatin 40 MG tablet Commonly known as: ZOCOR Take 40 mg by mouth at bedtime.   sucralfate 1 GM/10ML suspension Commonly known as: Carafate Take 10 mLs (1 g total) by mouth 2 (two) times daily.   Symbicort 160-4.5 MCG/ACT inhaler Generic drug: budesonide-formoterol Inhale  1 puff into the lungs 2 (two) times daily as needed (for flares).   telmisartan 80 MG tablet Commonly known as: MICARDIS Take 80 mg by mouth at bedtime.   tolterodine 2 MG 24 hr capsule Commonly known as: DETROL LA Take 2 mg by mouth daily.   torsemide 10 MG tablet Commonly known as: DEMADEX Take 10 mg by mouth daily as needed (for swelling in legs).   traMADol 50 MG tablet Commonly known as: ULTRAM Take 1 tablet (50 mg total) by mouth every 8 (eight) hours as needed for severe pain or moderate pain.        Allergies  Allergen Reactions   Fish-Derived Products Anaphylaxis   Morphine And Related Anaphylaxis and Swelling   Belsomra [Suvorexant] Other (See Comments)    Caused nightmares   Codeine Nausea And Vomiting   Fentanyl Hives   Ibuprofen Nausea Only and Other (See Comments)    Severe nausea- history of stomach ulcers   Nsaids Other (See Comments)    History of stomach ulcers   Other Other (See Comments)    History of stomach ulcers   Penicillins Hives    Did it involve  swelling of the face/tongue/throat, SOB, or low BP? Yes Did it involve sudden or severe rash/hives, skin peeling, or any reaction on the inside of your mouth or nose? Yes Did you need to seek medical attention at a hospital or doctor's office? Yes When did it last happen? More than 10 years ago If all above answers are "NO", may proceed with cephalosporin use.    Sulfa Antibiotics Swelling    Site of swelling not recalled   Tolmetin Other (See Comments)    History of stomach ulcers    Consultations: Gastroenterology   Procedures/Studies: CT ABDOMEN PELVIS W CONTRAST  Result Date: 07/08/2022 CLINICAL DATA:  Abdominal pain, nausea and vomiting. EXAM: CT ABDOMEN AND PELVIS WITH CONTRAST TECHNIQUE: Multidetector CT imaging of the abdomen and pelvis was performed using the standard protocol following bolus administration of intravenous contrast. RADIATION DOSE REDUCTION: This exam was performed according to the departmental dose-optimization program which includes automated exposure control, adjustment of the mA and/or kV according to patient size and/or use of iterative reconstruction technique. CONTRAST:  OMNIPAQUE IOHEXOL 300 MG/ML  SOLN COMPARISON:  CT with IV contrast 06/30/2022, MRI abdomen 05/19/2022. FINDINGS: Lower chest: Lung bases are clear. There is a large hiatal hernia. Small posterior diaphragmatic fat herniation The cardiac size is normal. There are scattered coronary artery calcifications. Hepatobiliary: Cholecystectomy again noted without bile duct dilatation or biloma. Liver is mildly steatotic without mass enhancement. Pancreas: No abnormality. Spleen: No abnormality. Adrenals/Urinary Tract: Adrenal glands are unremarkable. Kidneys are normal, without renal calculi, focal lesion, or hydronephrosis. There is mild bladder thickening versus changes of underdistention. Bilateral low ureteral insertions consistent with pelvic floor laxity, without cystocele. Stomach/Bowel: Large  hiatal hernia. About 2/3 of the stomach is intrathoracic. There is no small bowel obstruction or inflammation. An appendix is not seen in this patient. There is no dilatation or wall thickening of the colon. Vascular/Lymphatic: Aortic atherosclerosis. No enlarged abdominal or pelvic lymph nodes. Reproductive: Status post hysterectomy. No adnexal masses. Multiple pelvic phleboliths. Other: There are small midline umbilical and infraumbilical fat hernias. Small inguinal fat hernias. There is no incarcerated hernia. Stable 6.7 x 4.9 cm ovoid chronic fluid collection again noted associated with a ventral mesh hernia repair. Rim calcified focal fat necrosis at this level is seen subcutaneously right of the midline measuring 2.9  cm. There is no free air, free fluid or free hemorrhage. There are no acute inflammatory changes. Musculoskeletal: There is osteopenia and degenerative change of the spine, mild hip DJD. At L5-S1, advanced facet hypertrophy associated with grade 1 spondylolisthesis and moderate disc space loss. There is acquired spinal stenosis at this level. IMPRESSION: 1. Cystitis versus bladder nondistention. 2. Large hiatal hernia. 3. Aortic and coronary artery atherosclerosis. 4. 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 5. Small umbilical, infraumbilical and inguinal fat hernias. 6. Stable 6.7 x 4.9 cm chronic fluid collection associated with a ventral mesh hernia repair. 7. Osteopenia and degenerative change. Aortic Atherosclerosis (ICD10-I70.0). Electronically Signed   By: Almira Bar M.D.   On: 07/08/2022 04:54   CT ABDOMEN PELVIS W CONTRAST  Result Date: 06/30/2022 CLINICAL DATA:  Right lower quadrant abdominal pain. EXAM: CT ABDOMEN AND PELVIS WITH CONTRAST TECHNIQUE: Multidetector CT imaging of the abdomen and pelvis was performed using the standard protocol following bolus administration of intravenous contrast. RADIATION DOSE REDUCTION: This exam was performed  according to the departmental dose-optimization program which includes automated exposure control, adjustment of the mA and/or kV according to patient size and/or use of iterative reconstruction technique. CONTRAST:  86mL OMNIPAQUE IOHEXOL 350 MG/ML SOLN COMPARISON:  06/21/2022 FINDINGS: Lower chest: Large hiatal hernia. Hepatobiliary: No suspicious focal abnormality within the liver parenchyma. Gallbladder surgically absent. No intrahepatic or extrahepatic biliary dilation. Pancreas: No focal mass lesion. No dilatation of the main duct. No intraparenchymal cyst. No peripancreatic edema. Spleen: No splenomegaly. No focal mass lesion. Adrenals/Urinary Tract: No adrenal nodule or mass. Kidneys unremarkable. No evidence for hydroureter. The urinary bladder appears normal for the degree of distention. Stomach/Bowel: Large hiatal hernia. Stomach otherwise unremarkable. Duodenum is normally positioned as is the ligament of Treitz. No small bowel wall thickening. No small bowel dilatation. The terminal ileum is normal. The appendix is not well visualized, but there is no edema or inflammation in the region of the cecum. No gross colonic mass. No colonic wall thickening. Vascular/Lymphatic: There is mild atherosclerotic calcification of the abdominal aorta without aneurysm. There is no gastrohepatic or hepatoduodenal ligament lymphadenopathy. No retroperitoneal or mesenteric lymphadenopathy. No pelvic sidewall lymphadenopathy. Reproductive: Hysterectomy.  There is no adnexal mass. Other: No intraperitoneal free fluid. 6.5 x 4.9 cm chronic fluid collection associated with the patient's ventral mesh is stable as ring 6.4 x 4.6 cm today. Calcified structure just superficial to this in the subcutaneous fat is also unchanged and may be a chronic hematoma or region of fat necrosis. Musculoskeletal: No worrisome lytic or sclerotic osseous abnormality. IMPRESSION: 1. Stable exam. No acute findings in the abdomen or pelvis.  Specifically, no findings to explain the patient's history of right lower quadrant pain. 2. Large hiatal hernia. 3. Stable chronic fluid collection associated with the patient's ventral mesh, likely chronic hematoma or seroma. 4.  Aortic Atherosclerosis (ICD10-I70.0). Electronically Signed   By: Kennith Center M.D.   On: 06/30/2022 17:58   DG Chest 2 View  Result Date: 06/30/2022 CLINICAL DATA:  Chest pain since yesterday. EXAM: CHEST - 2 VIEW COMPARISON:  Chest x-ray 06/21/2022 FINDINGS: The cardiac silhouette, mediastinal and hilar contours are normal. Stable moderate-sized hiatal hernia. The lungs are clear. No pleural effusions. The bony thorax is intact. IMPRESSION: No acute cardiopulmonary findings. Electronically Signed   By: Rudie Meyer M.D.   On: 06/30/2022 15:04   US Abdomen Limited RUQ (LIVER/GB)  Result Date: 06/21/2022 CLINICAL DATA:  Hyperbilirubinemia. EXAM: ULTRASOUND ABDOMEN LIMITED  RIGHT UPPER QUADRANT COMPARISON:  CT abdomen and pelvis 06/11/2022 FINDINGS: Gallbladder: Surgically absent. Common bile duct: Diameter: 5 mm Liver: No focal lesion identified. Suboptimal evaluation secondary to overlying gas. Within normal limits in parenchymal echogenicity. Portal vein is patent on color Doppler imaging with normal direction of blood flow towards the liver. Other: Right pleural effusion noted. IMPRESSION: 1. No acute abnormality. 2. Right pleural effusion. Electronically Signed   By: Darliss Cheney M.D.   On: 06/21/2022 20:08   CT ABDOMEN PELVIS W CONTRAST  Result Date: 06/21/2022 CLINICAL DATA:  Abdominal pain, acute, nonlocalized EXAM: CT ABDOMEN AND PELVIS WITH CONTRAST TECHNIQUE: Multidetector CT imaging of the abdomen and pelvis was performed using the standard protocol following bolus administration of intravenous contrast. RADIATION DOSE REDUCTION: This exam was performed according to the departmental dose-optimization program which includes automated exposure control, adjustment  of the mA and/or kV according to patient size and/or use of iterative reconstruction technique. CONTRAST:  OMNIPAQUE IOHEXOL 300 MG/ML  SOLN COMPARISON:  CT dated May 09, 2022 FINDINGS: Lower chest: No acute abnormality. Hepatobiliary: Status post cholecystectomy presumed postsurgical changes along the inferior gallbladder fossa. No new findings within the liver. Subcentimeter hypodense lesions are too small to accurately characterize. Pancreas: Unremarkable. No pancreatic ductal dilatation or surrounding inflammatory changes. Spleen: Normal in size without focal abnormality. Adrenals/Urinary Tract: Adrenal glands are unremarkable. No hydronephrosis. Kidneys enhance symmetrically. No obstructing nephrolithiasis. Bladder is unremarkable. Stomach/Bowel: No evidence of bowel obstruction. Large hiatal hernia. Status post appendectomy. Vascular/Lymphatic: Atherosclerotic calcifications of the nonaneurysmal aorta. No new lymphadenopathy. Reproductive: Status post hysterectomy. No adnexal masses. Other: Revisualization of a fluid collection along the posterior abdominal muscular sure in the mid abdomen. This measures 6.5 x 4.9 cm, unchanged in comparison to prior and likely reflects a chronic seroma. Fat necrosis of the anterior wall soft tissues. Status post midline laparotomy. No free air or free fluid. Musculoskeletal: Revisualization of partial lumbarization of S1. Similar degree of grade 1 anterolisthesis at L5-S1, likely due to facet degenerative changes. Remote rib fractures. IMPRESSION: 1. No suspicious CT etiology for acute abdominal pain is identified. 2. Large hiatal hernia. Aortic Atherosclerosis (ICD10-I70.0). Electronically Signed   By: Meda Klinefelter M.D.   On: 06/21/2022 15:16   DG Chest Port 1 View  Result Date: 06/21/2022 CLINICAL DATA:  Weakness.  Epigastric pain. EXAM: PORTABLE CHEST 1 VIEW COMPARISON:  One-view chest x-ray 05/16/2022 FINDINGS: Heart size is normal. Atherosclerotic  changes are present at the aortic arch. A large hiatal hernia is again noted. No edema or effusion is present. No focal airspace disease is present. Degenerative changes are present at the right shoulder. IMPRESSION: 1. No acute cardiopulmonary disease. 2. Stable large hiatal hernia. Electronically Signed   By: Marin Roberts M.D.   On: 06/21/2022 12:58   (Echo, Carotid, EGD, Colonoscopy, ERCP)    Subjective: Patient seen and examined.  Denies any complaints today.  She tells me that her pain is controlled with tramadol.  Able to eat full liquid diet without any recurrence of nausea or vomiting.  Eager to go home.   Discharge Exam: Vitals:   07/12/22 2038 07/13/22 0534  BP: (!) 104/55 127/67  Pulse: (!) 105 90  Resp: 14 14  Temp: (!) 97.5 F (36.4 C) 98.6 F (37 C)  SpO2: 100% 100%   Vitals:   07/12/22 1054 07/12/22 1252 07/12/22 2038 07/13/22 0534  BP: 136/80 134/70 (!) 104/55 127/67  Pulse: 100 (!) 103 (!) 105 90  Resp: 18 18  14 14  Temp: 98.2 F (36.8 C) 98.4 F (36.9 C) (!) 97.5 F (36.4 C) 98.6 F (37 C)  TempSrc: Oral Oral Oral Oral  SpO2: 100% 100% 100% 100%  Weight:      Height:        General: Pt is alert, awake, not in acute distress, chronically sick looking.  Flat affect. Cardiovascular: RRR, S1/S2 +, no rubs, no gallops Respiratory: CTA bilaterally, no wheezing, no rhonchi Abdominal: Soft, mild tenderness in the epigastrium, palpable firm tender nodule.  ND, bowel sounds + Extremities: no edema, no cyanosis    The results of significant diagnostics from this hospitalization (including imaging, microbiology, ancillary and laboratory) are listed below for reference.     Microbiology: No results found for this or any previous visit (from the past 240 hour(s)).   Labs: BNP (last 3 results) Recent Labs    05/08/22 2023  BNP 19.7   Basic Metabolic Panel: Recent Labs  Lab 07/07/22 2232 07/09/22 0513 07/10/22 0554  NA 138 141 140  K 2.7* 2.9*  4.2  CL 103 110 113*  CO2 19* 21* 17*  GLUCOSE 68* 73 71  BUN 6* 8 7*  CREATININE 0.80 0.75 0.55  CALCIUM 8.9 8.6* 8.3*  MG 2.0  --   --    Liver Function Tests: Recent Labs  Lab 07/09/22 0513 07/10/22 0554 07/11/22 0553 07/12/22 0556 07/13/22 0609  AST 13* 10* 11* 10* 9*  ALT ALKPHOS 83 74 69 79 65  BILITOT 2.1* 1.6* 1.4* 1.3* 1.3*  PROT 5.9* 5.2* 5.1* 5.3* 4.8*  ALBUMIN 2.7* 2.6* 2.1* 2.4* 2.3*   Recent Labs  Lab 07/09/22 0513 07/10/22 0554 07/11/22 0553 07/12/22 0556 07/13/22 0609  LIPASE 480* 218* 140* 105* 100*   No results for input(s): "AMMONIA" in the last 168 hours. CBC: Recent Labs  Lab 07/07/22 2232 07/09/22 0513 07/11/22 0553 07/12/22 0556  WBC 5.7 6.1 6.6  --   NEUTROABS 4.0  --  5.2  --   HGB 10.2* 8.9* 7.2* 8.0*  HCT 31.6* 27.2* 22.3* 24.5*  MCV 81.7 81.0 82.6  --   PLT 277 273 265  --    Cardiac Enzymes: No results for input(s): "CKTOTAL", "CKMB", "CKMBINDEX", "TROPONINI" in the last 168 hours. BNP: Invalid input(s): "POCBNP" CBG: Recent Labs  Lab 07/08/22 0352  GLUCAP 87   D-Dimer No results for input(s): "DDIMER" in the last 72 hours. Hgb A1c No results for input(s): "HGBA1C" in the last 72 hours. Lipid Profile No results for input(s): "CHOL", "HDL", "LDLCALC", "TRIG", "CHOLHDL", "LDLDIRECT" in the last 72 hours. Thyroid function studies No results for input(s): "TSH", "T4TOTAL", "T3FREE", "THYROIDAB" in the last 72 hours.  Invalid input(s): "FREET3" Anemia work up No results for input(s): "VITAMINB12", "FOLATE", "FERRITIN", "TIBC", "IRON", "RETICCTPCT" in the last 72 hours. Urinalysis    Component Value Date/Time   COLORURINE AMBER (A) 06/30/2022 2025   APPEARANCEUR CLEAR 06/30/2022 2025   LABSPEC 1.034 (H) 06/30/2022 2025   PHURINE 6.0 06/30/2022 2025   GLUCOSEU 50 (A) 06/30/2022 2025   HGBUR NEGATIVE 06/30/2022 2025   BILIRUBINUR NEGATIVE 06/30/2022 2025   KETONESUR 20 (A) 06/30/2022 2025   PROTEINUR  NEGATIVE 06/30/2022 2025   UROBILINOGEN 1.0 01/11/2010 1841   NITRITE NEGATIVE 06/30/2022 2025   LEUKOCYTESUR NEGATIVE 06/30/2022 2025   Sepsis Labs Recent Labs  Lab 07/07/22 2232 07/09/22 0513 07/11/22 0553  WBC 5.7 6.1 6.6   Microbiology No results found for this or  any previous visit (from the past 240 hour(s)).   Time coordinating discharge: 35 minutes  SIGNED:   Dorcas Carrow, MD  [facility] 07/13/2022, 10:15 AM

## 2022-07-13 NOTE — TOC Transition Note (Signed)
Transition of Care [facility]) - CM/SW Discharge Note   Patient Details  Name: Stephanie Benton MRN: [id number] Date of Birth: 01-11-1951  [facility]) CM/SW Contact:  Golda Acre, RN Phone Number: 07/13/2022, 11:01 AM   Clinical Narrative:     [facility].  Services [place] pt dcd to home.  Final next level of care: Home w Home Health Services Barriers to Discharge: Continued Medical Work up   Patient Goals and CMS Choice Patient states their goals for this hospitalization and ongoing recovery are:: To go home CMS Medicare.gov Compare Post Acute Care list provided to:: Patient Choice offered to / list presented to : Patient, Sibling  Discharge Placement                       Discharge Plan and Services In-house Referral: NA Discharge Planning Services: CM Consult Post Acute Care Choice: Skilled Nursing Facility, Home Health          DME Arranged: N/A DME Agency: NA       HH Arranged: RN, PT, OT, Nurse's Aide HH Agency: [facility] Date [facility] Agency Contacted: 07/11/22 Time HH Agency Contacted: 1047 Representative spoke with at [place] Agency: Jerilynn Som  Social Determinants of Health (SDOH) Interventions     Readmission Risk Interventions   Row Labels 07/10/2022   10:49 AM 06/24/2022    9:42 AM 05/21/2022   10:04 AM  Readmission Risk Prevention Plan   Section Header. No data exists in this row.     Transportation Screening   Complete Complete Complete  PCP or Specialist Appt within 3-5 Days    Complete   Home Care Screening     Complete  Medication Review (RN CM)     Complete  HRI or Home Care Consult    Complete   Social Work Consult for Recovery Care Planning/Counseling    Complete   Palliative Care Screening    Not Applicable   Medication Review [employer])   Complete Complete   PCP or Specialist appointment within 3-5 days of discharge   Complete    HRI or Home Care Consult   Complete    SW Recovery  Care/Counseling Consult   Complete    Palliative Care Screening   Not Applicable

## 2022-07-14 LAB — VITAMIN E
Vitamin E (Alpha Tocopherol): 8.6 mg/L — ABNORMAL LOW (ref 9.0–29.0)
Vitamin E(Gamma Tocopherol): 1.2 mg/L (ref 0.5–4.9)

## 2022-07-14 LAB — VITAMIN A: Vitamin A (Retinoic Acid): 11.6 ug/dL — ABNORMAL LOW (ref 22.0–69.5)

## 2022-07-14 NOTE — Anesthesia Postprocedure Evaluation (Signed)
Anesthesia Post Note  Patient: Stephanie Benton  Procedure(s) Performed: ESOPHAGOGASTRODUODENOSCOPY (EGD) WITH PROPOFOL     Patient location during evaluation: Endoscopy Anesthesia Type: MAC Level of consciousness: awake and alert Pain management: pain level controlled Vital Signs Assessment: post-procedure vital signs reviewed and stable Respiratory status: spontaneous breathing, nonlabored ventilation, respiratory function stable and patient connected to nasal cannula oxygen Cardiovascular status: blood pressure returned to baseline and stable Postop Assessment: no apparent nausea or vomiting Anesthetic complications: no  No notable events documented.  Last Vitals:  Vitals:   07/12/22 2038 07/13/22 0534  BP: (!) 104/55 127/67  Pulse: (!) 105 90  Resp: 14 14  Temp: (!) 36.4 C 37 C  SpO2: 100% 100%    Last Pain:  Vitals:   07/13/22 0848  TempSrc:   PainSc: Asleep                 Mande Auvil L Nickalos Petersen

## 2022-07-15 ENCOUNTER — Emergency Department (HOSPITAL_COMMUNITY): Payer: Medicare Other

## 2022-07-15 ENCOUNTER — Inpatient Hospital Stay (HOSPITAL_COMMUNITY)
Admission: EM | Admit: 2022-07-15 | Discharge: 2022-07-22 | DRG: 682 | Disposition: A | Discharge status: Skilled Nursing Facility | Payer: Medicare Other | Attending: Internal Medicine | Admitting: Internal Medicine

## 2022-07-15 ENCOUNTER — Other Ambulatory Visit: Payer: Self-pay

## 2022-07-15 ENCOUNTER — Encounter (HOSPITAL_COMMUNITY): Payer: Self-pay | Admitting: Gastroenterology

## 2022-07-15 DIAGNOSIS — I10 Essential (primary) hypertension: Secondary | ICD-10-CM | POA: Diagnosis present

## 2022-07-15 DIAGNOSIS — K209 Esophagitis, unspecified without bleeding: Secondary | ICD-10-CM | POA: Diagnosis present

## 2022-07-15 DIAGNOSIS — Z885 Allergy status to narcotic agent status: Secondary | ICD-10-CM

## 2022-07-15 DIAGNOSIS — Z6830 Body mass index (BMI) 30.0-30.9, adult: Secondary | ICD-10-CM

## 2022-07-15 DIAGNOSIS — Z886 Allergy status to analgesic agent status: Secondary | ICD-10-CM

## 2022-07-15 DIAGNOSIS — F17210 Nicotine dependence, cigarettes, uncomplicated: Secondary | ICD-10-CM | POA: Diagnosis present

## 2022-07-15 DIAGNOSIS — D649 Anemia, unspecified: Secondary | ICD-10-CM | POA: Diagnosis present

## 2022-07-15 DIAGNOSIS — Z91013 Allergy to seafood: Secondary | ICD-10-CM

## 2022-07-15 DIAGNOSIS — F319 Bipolar disorder, unspecified: Secondary | ICD-10-CM | POA: Diagnosis present

## 2022-07-15 DIAGNOSIS — N179 Acute kidney failure, unspecified: Secondary | ICD-10-CM | POA: Diagnosis not present

## 2022-07-15 DIAGNOSIS — Z20822 Contact with and (suspected) exposure to covid-19: Secondary | ICD-10-CM | POA: Diagnosis present

## 2022-07-15 DIAGNOSIS — Z79899 Other long term (current) drug therapy: Secondary | ICD-10-CM

## 2022-07-15 DIAGNOSIS — E871 Hypo-osmolality and hyponatremia: Secondary | ICD-10-CM | POA: Diagnosis present

## 2022-07-15 DIAGNOSIS — T68XXXA Hypothermia, initial encounter: Secondary | ICD-10-CM | POA: Diagnosis not present

## 2022-07-15 DIAGNOSIS — G8929 Other chronic pain: Secondary | ICD-10-CM | POA: Diagnosis present

## 2022-07-15 DIAGNOSIS — M6282 Rhabdomyolysis: Secondary | ICD-10-CM | POA: Insufficient documentation

## 2022-07-15 DIAGNOSIS — R680 Hypothermia, not associated with low environmental temperature: Secondary | ICD-10-CM | POA: Diagnosis present

## 2022-07-15 DIAGNOSIS — R109 Unspecified abdominal pain: Secondary | ICD-10-CM | POA: Diagnosis present

## 2022-07-15 DIAGNOSIS — Z880 Allergy status to penicillin: Secondary | ICD-10-CM

## 2022-07-15 DIAGNOSIS — R748 Abnormal levels of other serum enzymes: Secondary | ICD-10-CM | POA: Insufficient documentation

## 2022-07-15 DIAGNOSIS — Z888 Allergy status to other drugs, medicaments and biological substances status: Secondary | ICD-10-CM

## 2022-07-15 DIAGNOSIS — G9341 Metabolic encephalopathy: Secondary | ICD-10-CM | POA: Insufficient documentation

## 2022-07-15 DIAGNOSIS — Z66 Do not resuscitate: Secondary | ICD-10-CM | POA: Diagnosis not present

## 2022-07-15 DIAGNOSIS — Z882 Allergy status to sulfonamides status: Secondary | ICD-10-CM

## 2022-07-15 DIAGNOSIS — R6510 Systemic inflammatory response syndrome (SIRS) of non-infectious origin without acute organ dysfunction: Secondary | ICD-10-CM | POA: Diagnosis present

## 2022-07-15 DIAGNOSIS — Z9049 Acquired absence of other specified parts of digestive tract: Secondary | ICD-10-CM

## 2022-07-15 DIAGNOSIS — E440 Moderate protein-calorie malnutrition: Secondary | ICD-10-CM | POA: Insufficient documentation

## 2022-07-15 DIAGNOSIS — Z8249 Family history of ischemic heart disease and other diseases of the circulatory system: Secondary | ICD-10-CM

## 2022-07-15 DIAGNOSIS — Z90710 Acquired absence of both cervix and uterus: Secondary | ICD-10-CM

## 2022-07-15 DIAGNOSIS — E785 Hyperlipidemia, unspecified: Secondary | ICD-10-CM | POA: Diagnosis present

## 2022-07-15 DIAGNOSIS — E860 Dehydration: Secondary | ICD-10-CM | POA: Diagnosis present

## 2022-07-15 DIAGNOSIS — E872 Acidosis, unspecified: Secondary | ICD-10-CM | POA: Diagnosis present

## 2022-07-15 DIAGNOSIS — A419 Sepsis, unspecified organism: Secondary | ICD-10-CM | POA: Diagnosis present

## 2022-07-15 DIAGNOSIS — Z9851 Tubal ligation status: Secondary | ICD-10-CM

## 2022-07-15 LAB — CBC WITH DIFFERENTIAL/PLATELET
Abs Immature Granulocytes: 0.21 10*3/uL — ABNORMAL HIGH (ref 0.00–0.07)
Basophils Absolute: 0 10*3/uL (ref 0.0–0.1)
Basophils Relative: 0 %
Eosinophils Absolute: 0 10*3/uL (ref 0.0–0.5)
Eosinophils Relative: 0 %
HCT: 23.8 % — ABNORMAL LOW (ref 36.0–46.0)
Hemoglobin: 7.9 g/dL — ABNORMAL LOW (ref 12.0–15.0)
Immature Granulocytes: 2 %
Lymphocytes Relative: 4 %
Lymphs Abs: 0.6 10*3/uL — ABNORMAL LOW (ref 0.7–4.0)
MCH: 26.7 pg (ref 26.0–34.0)
MCHC: 33.2 g/dL (ref 30.0–36.0)
MCV: 80.4 fL (ref 80.0–100.0)
Monocytes Absolute: 0.9 10*3/uL (ref 0.1–1.0)
Monocytes Relative: 7 %
Neutro Abs: 11.2 10*3/uL — ABNORMAL HIGH (ref 1.7–7.7)
Neutrophils Relative %: 87 %
Platelets: 309 10*3/uL (ref 150–400)
RBC: 2.96 MIL/uL — ABNORMAL LOW (ref 3.87–5.11)
RDW: 16.9 % — ABNORMAL HIGH (ref 11.5–15.5)
WBC: 12.9 10*3/uL — ABNORMAL HIGH (ref 4.0–10.5)
nRBC: 1.6 % — ABNORMAL HIGH (ref 0.0–0.2)

## 2022-07-15 LAB — COMPREHENSIVE METABOLIC PANEL
ALT: 33 U/L (ref 0–44)
AST: 84 U/L — ABNORMAL HIGH (ref 15–41)
Albumin: 2.3 g/dL — ABNORMAL LOW (ref 3.5–5.0)
Alkaline Phosphatase: 80 U/L (ref 38–126)
Anion gap: 14 (ref 5–15)
BUN: 19 mg/dL (ref 8–23)
CO2: 15 mmol/L — ABNORMAL LOW (ref 22–32)
Calcium: 8.8 mg/dL — ABNORMAL LOW (ref 8.9–10.3)
Chloride: 110 mmol/L (ref 98–111)
Creatinine, Ser: 1.97 mg/dL — ABNORMAL HIGH (ref 0.44–1.00)
GFR, Estimated: 27 mL/min — ABNORMAL LOW (ref >60)
Glucose, Bld: 131 mg/dL — ABNORMAL HIGH (ref 70–99)
Potassium: 3.6 mmol/L (ref 3.5–5.1)
Sodium: 139 mmol/L (ref 135–145)
Total Bilirubin: 1.6 mg/dL — ABNORMAL HIGH (ref 0.3–1.2)
Total Protein: 5.2 g/dL — ABNORMAL LOW (ref 6.5–8.1)

## 2022-07-15 LAB — LIPASE, BLOOD: Lipase: 132 U/L — ABNORMAL HIGH (ref 11–51)

## 2022-07-15 LAB — LACTIC ACID, PLASMA: Lactic Acid, Venous: 7.6 mmol/L (ref 0.5–1.9)

## 2022-07-15 LAB — CK: Total CK: 1905 U/L — ABNORMAL HIGH (ref 38–234)

## 2022-07-15 MED ORDER — VANCOMYCIN HCL 2000 MG/400ML IV SOLN
2000.0000 mg | Freq: Once | INTRAVENOUS | Status: AC
  Administered 2022-07-16: 2000 mg via INTRAVENOUS
  Filled 2022-07-15: qty 400

## 2022-07-15 MED ORDER — LACTATED RINGERS IV SOLN: INTRAVENOUS | Status: DC

## 2022-07-15 MED ORDER — SODIUM CHLORIDE 0.9 % IV SOLN
1.0000 g | Freq: Once | INTRAVENOUS | Status: AC
  Administered 2022-07-16: 1 g via INTRAVENOUS
  Filled 2022-07-15: qty 20

## 2022-07-15 MED ORDER — LACTATED RINGERS IV BOLUS
1800.0000 mL | Freq: Once | INTRAVENOUS | Status: AC
  Administered 2022-07-16: 1800 mL via INTRAVENOUS

## 2022-07-15 MED ORDER — VANCOMYCIN HCL IN DEXTROSE 1-5 GM/200ML-% IV SOLN: 1000.0000 mg | Freq: Once | INTRAVENOUS | Status: DC

## 2022-07-15 MED ORDER — METRONIDAZOLE 500 MG/100ML IV SOLN
500.0000 mg | Freq: Once | INTRAVENOUS | Status: AC
  Administered 2022-07-15: 500 mg via INTRAVENOUS
  Filled 2022-07-15: qty 100

## 2022-07-15 MED ORDER — LACTATED RINGERS IV BOLUS (SEPSIS)
1000.0000 mL | Freq: Once | INTRAVENOUS | Status: AC
  Administered 2022-07-15: 1000 mL via INTRAVENOUS

## 2022-07-15 NOTE — ED Provider Notes (Signed)
[facility]-EMERGENCY DEPT Provider Note   CSN: [id number] Arrival date & time: 07/15/22  2041     History  Chief Complaint  Patient presents with   Altered Mental Status   Fall    Stephanie Benton is a 71 y.o. female.  Patient is a 71 year old female who presents with altered mental status and weakness.  She has a history of hypertension, hyperlipidemia, chronic abdominal pain, bipolar disorder.  She had her gallbladder removed about 2 months ago.  She was recently admitted from December 4 to December 9 for worsening of her chronic abdominal pain.  It was felt to be due to multiple etiologies such as acute on chronic pancreatitis, GERD.  She was noted to be anemic.  She had an EGD which showed esophagitis.  She was maintained on tramadol and Lyrica.  She was discharged home on the ninth of this month.  She presents here by EMS with her family at bedside.  They state that since she is got home from the hospital she has not really been able to walk over the weekend.  She has gotten progressively worse.  She has generalized weakness.  She was found on the floor when her grandson went to check on her this afternoon.  It is unclear how long she was on the floor.  She does not remember when she fell.  She has not had any vomiting since she left the hospital.  She denies any diarrhea.  She does complain of abdominal pain.  No cough or cold symptoms.  No known fevers.  She was found to be hypothermic on arrival with a core temp of 92.2.       Home Medications Prior to Admission medications   Medication Sig Start Date End Date Taking? Authorizing Provider  albuterol (PROVENTIL) (2.5 MG/3ML) 0.083% nebulizer solution Take 3 mLs (2.5 mg total) by nebulization every 6 (six) hours as needed for wheezing or shortness of breath. 11/27/14   Elson Areas, PA-C  albuterol (VENTOLIN HFA) 108 (90 Base) MCG/ACT inhaler Inhale 2 puffs into the lungs every 6 (six) hours as needed for  wheezing or shortness of breath.    [provider]  dexlansoprazole (DEXILANT) 60 MG capsule Take 60 mg by mouth daily. 06/14/22   [provider]  diclofenac sodium (VOLTAREN) 1 % GEL Apply 2 g topically daily as needed (for knee pain). 12/04/17   [provider]  dorzolamide (TRUSOPT) 2 % ophthalmic solution Place 1 drop into both eyes 2 (two) times daily.    [provider]  DULoxetine 40 MG CPEP Take 40 mg by mouth daily. 06/25/22 06/25/23  Rolly Salter, MD  feeding supplement (ENSURE ENLIVE / ENSURE PLUS) LIQD Take 237 mLs by mouth 2 (two) times daily between meals. Patient taking differently: Take 237 mLs by mouth daily. 06/25/22   Rolly Salter, MD  FEROSUL 325 (65 Fe) MG tablet Take 325 mg by mouth daily with breakfast. 01/24/22   [provider]  folic acid (FOLVITE) 1 MG tablet Take 1 tablet (1 mg total) by mouth daily. 07/14/22 08/13/22  Dorcas Carrow, MD  latanoprost (XALATAN) 0.005 % ophthalmic solution Place 1 drop into both eyes at bedtime. 02/16/18   [provider]  ondansetron (ZOFRAN) 4 MG tablet Take 1 tablet (4 mg total) by mouth every 8 (eight) hours as needed for up to 14 days for nausea or vomiting. 07/13/22 07/27/22  Dorcas Carrow, MD  polyethylene glycol powder (GLYCOLAX/MIRALAX) 17  GM/SCOOP powder Take 1 cap full (17 g) with water by mouth daily. Patient taking differently: Take 17 g by mouth daily as needed for mild constipation. 05/24/22   Zigmund Daniel., MD  pregabalin (LYRICA) 100 MG capsule Take 1 capsule (100 mg total) by mouth 3 (three) times daily. 06/25/22   Rolly Salter, MD  risperiDONE (RISPERDAL) 1 MG tablet Take 1 mg by mouth at bedtime.  11/24/14   [provider]  sertraline (ZOLOFT) 100 MG tablet Take 100 mg by mouth at bedtime.  05/05/13   [provider]  simvastatin (ZOCOR) 40 MG tablet Take 40 mg by mouth at bedtime.  05/09/15   [provider]  sucralfate  (CARAFATE) 1 GM/10ML suspension Take 10 mLs (1 g total) by mouth 2 (two) times daily. 06/25/22 07/25/22  Rolly Salter, MD  SYMBICORT 160-4.5 MCG/ACT inhaler Inhale 1 puff into the lungs 2 (two) times daily as needed (for flares).  01/28/18   [provider]  telmisartan (MICARDIS) 80 MG tablet Take 80 mg by mouth at bedtime.  06/24/18   [provider]  tolterodine (DETROL LA) 2 MG 24 hr capsule Take 2 mg by mouth daily. 05/15/22   [provider]  torsemide (DEMADEX) 10 MG tablet Take 10 mg by mouth daily as needed (for swelling in legs). 05/06/22   [provider]  traMADol (ULTRAM) 50 MG tablet Take 1 tablet (50 mg total) by mouth every 8 (eight) hours as needed for severe pain or moderate pain. 06/25/22   Rolly Salter, MD      Allergies    Fish-derived products, Morphine and related, Belsomra [suvorexant], Codeine, Fentanyl, Ibuprofen, Nsaids, Other, Penicillins, Sulfa antibiotics, and Tolmetin    Review of Systems   Review of Systems  Constitutional:  Positive for fatigue. Negative for chills, diaphoresis and fever.  HENT:  Negative for congestion, rhinorrhea and sneezing.   Eyes: Negative.   Respiratory:  Negative for cough, chest tightness and shortness of breath.   Cardiovascular:  Negative for chest pain and leg swelling.  Gastrointestinal:  Positive for abdominal pain. Negative for blood in stool, diarrhea, nausea and vomiting.  Genitourinary:  Negative for difficulty urinating, flank pain, frequency and hematuria.  Musculoskeletal:  Negative for arthralgias and back pain.  Skin:  Negative for rash.  Neurological:  Positive for weakness (Generalized). Negative for dizziness, speech difficulty, numbness and headaches.    Physical Exam Updated Vital Signs BP 133/80   Pulse (!) 101   Temp (!) 92.2 F (33.4 C) (Rectal)   Resp 15   Ht  (1.651 m)   Wt 93.4 kg   SpO2 100%   BMI 34.28 kg/m  Physical Exam Constitutional:       Appearance: She is well-developed.  HENT:     Head: Normocephalic and atraumatic.  Eyes:     Pupils: Pupils are equal, round, and reactive to light.  Cardiovascular:     Rate and Rhythm: Normal rate and regular rhythm.     Heart sounds: Normal heart sounds.  Pulmonary:     Effort: Pulmonary effort is normal. No respiratory distress.     Breath sounds: Normal breath sounds. No wheezing or rales.  Chest:     Chest wall: No tenderness.  Abdominal:     General: Bowel sounds are normal.     Palpations: Abdomen is soft.     Tenderness: There is abdominal tenderness (Generalized tenderness). There is no guarding or rebound.  Musculoskeletal:  General: Normal range of motion.     Cervical back: Normal range of motion and neck supple.  Lymphadenopathy:     Cervical: No cervical adenopathy.  Skin:    General: Skin is warm and dry.     Findings: No rash.  Neurological:     General: No focal deficit present.     Mental Status: She is alert and oriented to person, place, and time.     ED Results / Procedures / Treatments   Labs (all labs ordered are listed, but only abnormal results are displayed) Labs Reviewed  COMPREHENSIVE METABOLIC PANEL - Abnormal; Notable for the following components:      Result Value   CO2 15 (*)    Glucose, Bld 131 (*)    Creatinine, Ser 1.97 (*)    Calcium 8.8 (*)    Total Protein 5.2 (*)    Albumin 2.3 (*)    AST 84 (*)    Total Bilirubin 1.6 (*)    GFR, Estimated 27 (*)    All other components within normal limits  LACTIC ACID, PLASMA - Abnormal; Notable for the following components:   Lactic Acid, Venous 7.6 (*)    All other components within normal limits  LIPASE, BLOOD - Abnormal; Notable for the following components:   Lipase 132 (*)    All other components within normal limits  CBC WITH DIFFERENTIAL/PLATELET - Abnormal; Notable for the following components:   WBC 12.9 (*)    RBC 2.96 (*)    Hemoglobin 7.9 (*)    HCT 23.8 (*)    RDW  16.9 (*)    nRBC 1.6 (*)    Neutro Abs 11.2 (*)    Lymphs Abs 0.6 (*)    Abs Immature Granulocytes 0.21 (*)    All other components within normal limits  CK - Abnormal; Notable for the following components:   Total CK 1,905 (*)    All other components within normal limits  CULTURE, BLOOD (ROUTINE X 2)  CULTURE, BLOOD (ROUTINE X 2)  RESP PANEL BY RT-PCR (FLU A&B, COVID) ARPGX2  URINE CULTURE  LACTIC ACID, PLASMA  URINALYSIS, ROUTINE W REFLEX MICROSCOPIC  PROTIME-INR  APTT  I-STAT CHEM 8, ED    EKG EKG Interpretation  Date/Time:  Monday July 15 2022 21:54:49 EST Ventricular Rate:  98 PR Interval:  156 QRS Duration: 86 QT Interval:  370 QTC Calculation: 473 R Axis:   48 Text Interpretation: Sinus rhythm Probable left atrial enlargement Low voltage, precordial leads RSR' in V1 or V2, probably normal variant Borderline T wave abnormalities Minimal ST elevation, inferior leads Confirmed by Rolan Bucco [phone]) on 07/15/2022 10:44:23 PM  Radiology CT ABDOMEN PELVIS WO CONTRAST  Result Date: 07/15/2022 CLINICAL DATA:  Abdominal pain, postop.  Found on floor. EXAM: CT ABDOMEN AND PELVIS WITHOUT CONTRAST TECHNIQUE: Multidetector CT imaging of the abdomen and pelvis was performed following the standard protocol without IV contrast. RADIATION DOSE REDUCTION: This exam was performed according to the departmental dose-optimization program which includes automated exposure control, adjustment of the mA and/or kV according to patient size and/or use of iterative reconstruction technique. COMPARISON:  07/08/2022 FINDINGS: Lower chest: Moderate-sized hiatal hernia, stable. No effusions or acute findings. Hepatobiliary: No focal liver abnormality is seen. Status post cholecystectomy. No biliary dilatation. Pancreas: No focal abnormality or ductal dilatation. Spleen: No focal abnormality.  Normal size. Adrenals/Urinary Tract: No adrenal abnormality. No focal renal abnormality. No stones or  hydronephrosis. Urinary bladder is unremarkable. Stomach/Bowel: Stomach and small  bowel decompressed. Colon is fluid-filled with scattered air-fluid levels. This could reflect diarrheal illness. No evidence of bowel obstruction. Vascular/Lymphatic: Aortic atherosclerosis. No evidence of aneurysm or adenopathy. Reproductive: Prior hysterectomy.  No adnexal masses. Other: No free fluid or free air. Small umbilical hernia containing fat, stable. Stable chronic ovoid fluid collection along the anterior abdominal wall above the umbilicus measuring 6.4 x 4.3 cm associated with ventral mesh hernia repair. Calcified structure in the overlying anterior subcutaneous soft tissues measures 2.4 cm, stable. Musculoskeletal: No acute bony abnormality. IMPRESSION: Stable moderate-sized hiatal hernia. Colon is fluid-filled with scattered air-fluid levels. This could reflect a diarrheal illness. No evidence of bowel obstruction. Stable anterior chronic fluid collection along the anterior mesh from prior ventral hernia repair. Aortic atherosclerosis. Electronically Signed   By: Charlett Nose M.D.   On: 07/15/2022 23:20   CT Cervical Spine Wo Contrast  Result Date: 07/15/2022 CLINICAL DATA:  Neck trauma (Age >= 65y).  Found and EXAM: CT CERVICAL SPINE WITHOUT CONTRAST TECHNIQUE: Multidetector CT imaging of the cervical spine was performed without intravenous contrast. Multiplanar CT image reconstructions were also generated. RADIATION DOSE REDUCTION: This exam was performed according to the departmental dose-optimization program which includes automated exposure control, adjustment of the mA and/or kV according to patient size and/or use of iterative reconstruction technique. COMPARISON:  None Available. FINDINGS: Alignment: Normal Skull base and vertebrae: No acute fracture. No primary bone lesion or focal pathologic process. Soft tissues and spinal canal: No prevertebral fluid or swelling. No visible canal hematoma. Disc levels:  Maintained. Early anterior spurring. Mild bilateral degenerative facet disease. Upper chest: No acute findings Other: None IMPRESSION: No acute bony abnormality. Electronically Signed   By: Charlett Nose M.D.   On: 07/15/2022 23:13   CT Head Wo Contrast  Result Date: 07/15/2022 CLINICAL DATA:  Mental status change, unknown cause found down EXAM: CT HEAD WITHOUT CONTRAST TECHNIQUE: Contiguous axial images were obtained from the base of the skull through the vertex without intravenous contrast. RADIATION DOSE REDUCTION: This exam was performed according to the departmental dose-optimization program which includes automated exposure control, adjustment of the mA and/or kV according to patient size and/or use of iterative reconstruction technique. COMPARISON:  None Available. FINDINGS: Brain: There is atrophy and chronic small vessel disease changes. No acute intracranial abnormality. Specifically, no hemorrhage, hydrocephalus, mass lesion, acute infarction, or significant intracranial injury. Vascular: No hyperdense vessel or unexpected calcification. Skull: No acute calvarial abnormality. Sinuses/Orbits: No acute findings Other: None IMPRESSION: Atrophy, chronic microvascular disease. No acute intracranial abnormality. Electronically Signed   By: Charlett Nose M.D.   On: 07/15/2022 23:10    Procedures Procedures    Medications Ordered in ED Medications  lactated ringers infusion (has no administration in time range)  lactated ringers bolus 1,000 mL (1,000 mLs Intravenous New Bag/Given 07/15/22 2325)  metroNIDAZOLE (FLAGYL) IVPB 500 mg (500 mg Intravenous New Bag/Given 07/15/22 2329)  vancomycin (VANCOREADY) IVPB 2000 mg/400 mL (has no administration in time range)  meropenem (MERREM) 1 g in sodium chloride 0.9 % 100 mL IVPB (has no administration in time range)    ED Course/ Medical Decision Making/ A&P                           Medical Decision Making Amount and/or Complexity of Data  Reviewed Labs: ordered. Radiology: ordered. ECG/medicine tests: ordered.  Risk Prescription drug management.   Patient is a 71 year old female who presents with weakness and altered mental  status.  She is alert and oriented on my exam.  She does have some abdominal pain which may be chronic.  However she was noted to be hypothermic on arrival and mildly tachycardic.  Code sepsis was started.  She was started on IV fluids and IV antibiotics.  Her WBC count is elevated at 12.9.  Her hemoglobin is low at 7.9 however based on review of recent records, it appears to be fairly stable from her last admission.  Does have evidence of an AKI.  Her CK is elevated.  CT scan of her abdomen pelvis and chest x-ray as well as x-ray of her knee and hips are pending.  Dr. Madilyn Hook to take over care pending results of imaging studies.  Patient to be admitted following results.  CRITICAL CARE Performed by: Rolan Bucco Total critical care time: 45 minutes Critical care time was exclusive of separately billable procedures and treating other patients. Critical care was necessary to treat or prevent imminent or life-threatening deterioration. Critical care was time spent personally by me on the following activities: development of treatment plan with patient and/or surrogate as well as nursing, discussions with consultants, evaluation of patient's response to treatment, examination of patient, obtaining history from patient or surrogate, ordering and performing treatments and interventions, ordering and review of laboratory studies, ordering and review of radiographic studies, pulse oximetry and re-evaluation of patient's condition.   Final Clinical Impression(s) / ED Diagnoses Final diagnoses:  None    Rx / DC Orders ED Discharge Orders     None         Rolan Bucco, MD 07/15/22 2342

## 2022-07-15 NOTE — ED Triage Notes (Addendum)
Pt to ED via EMS from home. EMS states that pt was found on the floor today. Pt states she slid off of the bed yesterday and has been on the floor since. She has an abrasion noted to R knee. Pt c/o lower abd pain. Pt lethargic but able to answer questions. Pt very cool to touch. Temp 92.2 in room rectally. EMS gave NS. Pt A/ox3  CBG 181

## 2022-07-15 NOTE — ED Notes (Signed)
Pt to CT via stretcher

## 2022-07-15 NOTE — Sepsis Progress Note (Signed)
Following per sepsis protocol   

## 2022-07-15 NOTE — Progress Notes (Signed)
A consult was received from an ED physician for Vancomycin and Cefepime per pharmacy dosing.  The patient's profile has been reviewed for ht/wt/allergies/indication/available labs.    Patient with noted PCN allergy (reaction = hives).  Patient did answer the question "Did it involve swelling of the face/tongue/throat, SOB, or low BP? "  Yes   No use of cephalosporins noted in EPIC history.  A one time order has been placed for Vancomycin 2gm IV and Dr Fredderick Phenix ok'ed using Meropenem 1gm IV x 1 and d/c cefepime.    Further antibiotics/pharmacy consults should be ordered by admitting physician if indicated.                       Thank you, Maryellen Pile, PharmD 07/15/2022  11:18 PM

## 2022-07-16 DIAGNOSIS — Z79899 Other long term (current) drug therapy: Secondary | ICD-10-CM | POA: Diagnosis not present

## 2022-07-16 DIAGNOSIS — G9341 Metabolic encephalopathy: Secondary | ICD-10-CM | POA: Insufficient documentation

## 2022-07-16 DIAGNOSIS — Z6830 Body mass index (BMI) 30.0-30.9, adult: Secondary | ICD-10-CM | POA: Diagnosis not present

## 2022-07-16 DIAGNOSIS — Z66 Do not resuscitate: Secondary | ICD-10-CM | POA: Diagnosis not present

## 2022-07-16 DIAGNOSIS — K209 Esophagitis, unspecified without bleeding: Secondary | ICD-10-CM | POA: Diagnosis present

## 2022-07-16 DIAGNOSIS — R6510 Systemic inflammatory response syndrome (SIRS) of non-infectious origin without acute organ dysfunction: Secondary | ICD-10-CM | POA: Diagnosis present

## 2022-07-16 DIAGNOSIS — E860 Dehydration: Secondary | ICD-10-CM | POA: Diagnosis present

## 2022-07-16 DIAGNOSIS — A419 Sepsis, unspecified organism: Secondary | ICD-10-CM | POA: Diagnosis not present

## 2022-07-16 DIAGNOSIS — F17210 Nicotine dependence, cigarettes, uncomplicated: Secondary | ICD-10-CM | POA: Diagnosis present

## 2022-07-16 DIAGNOSIS — I10 Essential (primary) hypertension: Secondary | ICD-10-CM | POA: Diagnosis present

## 2022-07-16 DIAGNOSIS — R680 Hypothermia, not associated with low environmental temperature: Secondary | ICD-10-CM | POA: Diagnosis present

## 2022-07-16 DIAGNOSIS — R748 Abnormal levels of other serum enzymes: Secondary | ICD-10-CM | POA: Insufficient documentation

## 2022-07-16 DIAGNOSIS — G8929 Other chronic pain: Secondary | ICD-10-CM | POA: Diagnosis present

## 2022-07-16 DIAGNOSIS — R6520 Severe sepsis without septic shock: Secondary | ICD-10-CM | POA: Diagnosis not present

## 2022-07-16 DIAGNOSIS — D649 Anemia, unspecified: Secondary | ICD-10-CM | POA: Diagnosis present

## 2022-07-16 DIAGNOSIS — N179 Acute kidney failure, unspecified: Secondary | ICD-10-CM | POA: Diagnosis present

## 2022-07-16 DIAGNOSIS — T68XXXA Hypothermia, initial encounter: Secondary | ICD-10-CM | POA: Diagnosis present

## 2022-07-16 DIAGNOSIS — Z9049 Acquired absence of other specified parts of digestive tract: Secondary | ICD-10-CM | POA: Diagnosis not present

## 2022-07-16 DIAGNOSIS — E871 Hypo-osmolality and hyponatremia: Secondary | ICD-10-CM | POA: Diagnosis present

## 2022-07-16 DIAGNOSIS — M6282 Rhabdomyolysis: Secondary | ICD-10-CM | POA: Diagnosis present

## 2022-07-16 DIAGNOSIS — R109 Unspecified abdominal pain: Secondary | ICD-10-CM | POA: Diagnosis present

## 2022-07-16 DIAGNOSIS — Z8249 Family history of ischemic heart disease and other diseases of the circulatory system: Secondary | ICD-10-CM | POA: Diagnosis not present

## 2022-07-16 DIAGNOSIS — Z20822 Contact with and (suspected) exposure to covid-19: Secondary | ICD-10-CM | POA: Diagnosis present

## 2022-07-16 DIAGNOSIS — E785 Hyperlipidemia, unspecified: Secondary | ICD-10-CM | POA: Diagnosis present

## 2022-07-16 DIAGNOSIS — F319 Bipolar disorder, unspecified: Secondary | ICD-10-CM | POA: Diagnosis present

## 2022-07-16 DIAGNOSIS — E872 Acidosis, unspecified: Secondary | ICD-10-CM | POA: Diagnosis present

## 2022-07-16 DIAGNOSIS — Z91013 Allergy to seafood: Secondary | ICD-10-CM | POA: Diagnosis not present

## 2022-07-16 DIAGNOSIS — E440 Moderate protein-calorie malnutrition: Secondary | ICD-10-CM | POA: Diagnosis present

## 2022-07-16 LAB — URINALYSIS, ROUTINE W REFLEX MICROSCOPIC
Bilirubin Urine: NEGATIVE
Glucose, UA: NEGATIVE mg/dL
Ketones, ur: NEGATIVE mg/dL
Leukocytes,Ua: NEGATIVE
Nitrite: NEGATIVE
Protein, ur: NEGATIVE mg/dL
Specific Gravity, Urine: 1.008 (ref 1.005–1.030)
pH: 5 (ref 5.0–8.0)

## 2022-07-16 LAB — CBC
HCT: 23.7 % — ABNORMAL LOW (ref 36.0–46.0)
Hemoglobin: 7.9 g/dL — ABNORMAL LOW (ref 12.0–15.0)
MCH: 26.7 pg (ref 26.0–34.0)
MCHC: 33.3 g/dL (ref 30.0–36.0)
MCV: 80.1 fL (ref 80.0–100.0)
Platelets: 293 10*3/uL (ref 150–400)
RBC: 2.96 MIL/uL — ABNORMAL LOW (ref 3.87–5.11)
RDW: 16.7 % — ABNORMAL HIGH (ref 11.5–15.5)
WBC: 14.5 10*3/uL — ABNORMAL HIGH (ref 4.0–10.5)
nRBC: 0.6 % — ABNORMAL HIGH (ref 0.0–0.2)

## 2022-07-16 LAB — COMPREHENSIVE METABOLIC PANEL
ALT: 44 U/L (ref 0–44)
AST: 149 U/L — ABNORMAL HIGH (ref 15–41)
Albumin: 2.3 g/dL — ABNORMAL LOW (ref 3.5–5.0)
Alkaline Phosphatase: 78 U/L (ref 38–126)
Anion gap: 12 (ref 5–15)
BUN: 20 mg/dL (ref 8–23)
CO2: 21 mmol/L — ABNORMAL LOW (ref 22–32)
Calcium: 9.1 mg/dL (ref 8.9–10.3)
Chloride: 107 mmol/L (ref 98–111)
Creatinine, Ser: 1.92 mg/dL — ABNORMAL HIGH (ref 0.44–1.00)
GFR, Estimated: 27 mL/min — ABNORMAL LOW (ref >60)
Glucose, Bld: 94 mg/dL (ref 70–99)
Potassium: 3.5 mmol/L (ref 3.5–5.1)
Sodium: 140 mmol/L (ref 135–145)
Total Bilirubin: 1.6 mg/dL — ABNORMAL HIGH (ref 0.3–1.2)
Total Protein: 5.1 g/dL — ABNORMAL LOW (ref 6.5–8.1)

## 2022-07-16 LAB — RESP PANEL BY RT-PCR (FLU A&B, COVID) ARPGX2
Influenza A by PCR: NEGATIVE
Influenza B by PCR: NEGATIVE
SARS Coronavirus 2 by RT PCR: NEGATIVE

## 2022-07-16 LAB — AMMONIA: Ammonia: 37 umol/L — ABNORMAL HIGH (ref 9–35)

## 2022-07-16 LAB — LACTIC ACID, PLASMA: Lactic Acid, Venous: 4.3 mmol/L (ref 0.5–1.9)

## 2022-07-16 LAB — APTT: aPTT: 46 seconds — ABNORMAL HIGH (ref 24–36)

## 2022-07-16 LAB — CK: Total CK: 2971 U/L — ABNORMAL HIGH (ref 38–234)

## 2022-07-16 LAB — MRSA NEXT GEN BY PCR, NASAL: MRSA by PCR Next Gen: NOT DETECTED

## 2022-07-16 LAB — PROTIME-INR
INR: 1.5 — ABNORMAL HIGH (ref 0.8–1.2)
Prothrombin Time: 17.6 seconds — ABNORMAL HIGH (ref 11.4–15.2)

## 2022-07-16 MED ORDER — LACTATED RINGERS IV SOLN: INTRAVENOUS | Status: DC

## 2022-07-16 MED ORDER — VANCOMYCIN HCL IN DEXTROSE 1-5 GM/200ML-% IV SOLN: 1000.0000 mg | Freq: Once | INTRAVENOUS | Status: DC

## 2022-07-16 MED ORDER — LACTATED RINGERS IV SOLN: INTRAVENOUS | Status: AC

## 2022-07-16 MED ORDER — CHLORHEXIDINE GLUCONATE CLOTH 2 % EX PADS
6.0000 | MEDICATED_PAD | Freq: Every day | CUTANEOUS | Status: DC
  Administered 2022-07-17 – 2022-07-21 (×5): 6 via TOPICAL

## 2022-07-16 MED ORDER — METRONIDAZOLE 500 MG/100ML IV SOLN
500.0000 mg | Freq: Two times a day (BID) | INTRAVENOUS | Status: DC
  Administered 2022-07-16 – 2022-07-17 (×4): 500 mg via INTRAVENOUS
  Filled 2022-07-16 (×4): qty 100

## 2022-07-16 MED ORDER — METRONIDAZOLE 500 MG/100ML IV SOLN: 500.0000 mg | Freq: Two times a day (BID) | INTRAVENOUS | Status: DC

## 2022-07-16 MED ORDER — SODIUM CHLORIDE 0.9 % IV SOLN: 2.0000 g | Freq: Once | INTRAVENOUS | Status: DC

## 2022-07-16 MED ORDER — SODIUM CHLORIDE 0.9 % IV SOLN
1.0000 g | Freq: Three times a day (TID) | INTRAVENOUS | Status: DC
  Administered 2022-07-16 – 2022-07-18 (×6): 1 g via INTRAVENOUS
  Filled 2022-07-16 (×8): qty 5

## 2022-07-16 MED ORDER — ORAL CARE MOUTH RINSE: 15.0000 mL | OROMUCOSAL | Status: DC | PRN

## 2022-07-16 MED ORDER — ENOXAPARIN SODIUM 30 MG/0.3ML IJ SOSY: 30.0000 mg | PREFILLED_SYRINGE | INTRAMUSCULAR | Status: DC

## 2022-07-16 MED ORDER — VANCOMYCIN HCL IN DEXTROSE 1-5 GM/200ML-% IV SOLN: 1000.0000 mg | INTRAVENOUS | Status: DC

## 2022-07-16 MED ORDER — MORPHINE SULFATE (PF) 2 MG/ML IV SOLN: 1.0000 mg | INTRAVENOUS | Status: DC | PRN

## 2022-07-16 MED ORDER — HYDROMORPHONE HCL 1 MG/ML IJ SOLN
0.2500 mg | INTRAMUSCULAR | Status: DC | PRN
  Administered 2022-07-16 – 2022-07-22 (×23): 0.5 mg via INTRAVENOUS
  Filled 2022-07-16 (×23): qty 1

## 2022-07-16 NOTE — Progress Notes (Addendum)
PROGRESS NOTE  Stephanie Benton  DOB: 07-28-1951  PCP: [name], MD HTX:[id number]  DOA: 07/15/2022  LOS: 0 days  Hospital Day: 2  Brief narrative: KORIANNA Benton is a 71 y.o. female with PMH significant for HTN, HLD, chronic abdominal pain, anxiety, bipolar disorder who had cholecystectomy 2 months ago.  Recently admitted 12/3-12/9 for acute on chronic abdominal pain felt to be due to acute on chronic pancreatitis.  She was also noted to be anemic and had an EGD done which showed esophagitis. 12/11, patient was brought to the ED from home by EMS for altered mental status.  Per history, she slid off her bed 12/10 and remained on the floor for several hours when her family found her and called EMS. On arrival to the ED, patient was very cool to touch and rectal temperature 92.2 F.  She was placed on [facility].  Labs showed WBC count 12.9, hemoglobin 7.9 (was 8.0 on labs done 4 days ago), bicarb 15, glucose 131, creatinine 1.9 (baseline 0.5-0.8),  AST 84, T. bili 1.6, ALT and alk phos normal, lipase 132,  CK 1905, lactic acid 7.6> 4.3. Blood cultures pending COVID and influenza PCR negative UA is not suggestive of infection INR 1.5, ammonia 37,  Chest x-ray did not suggest pneumonia.   CT abdomen pelvis showed fluid-filled colon with scattered air-fluid levels which could reflect a diarrheal illness.   CT head/C-spine and x-rays of right knee and bilateral hips/pelvis were negative for acute finding.  Patient was given vancomycin, meropenem, metronidazole, and 2 L LR boluses.   Admitted to [facility]  Subjective: Patient was seen and examined this morning.  Elderly African-American female.  Awake, alert, oriented to place only.  Family not at bedside. Overnight vital signs noted.  Temperature improved to normal with Bair hugger, tachycardic to 110s this morning, blood pressure stable, breathing on 2 L oxygen  Assessment and plan: Hypothermia Sepsis versus SIRS Patient was on the  floor for several hours without access to water which probably is the cause of hypothermia, tachycardia, leukocytosis, lactic acidosis.  No history of any diarrhea, fever, urinary symptoms, cough, shortness of breath at home. Initial workup did not show any evidence of infection as supported by negative COVID and influenza PCR, unremarkable UA, unremarkable chest x-ray Patient was placed on Bair hugger with subsequent improvement in temperature Given broad-spectrum IV antibiotics in the ED Continue IV hydration. Lactic acid level gradually improving.   Continue to monitor temperature trend, WBC count and lactic acid level. Recent Labs  Lab 07/11/22 0553 07/15/22 2243 07/16/22 0134 07/16/22 0515  WBC 6.6 12.9*  --  14.5*  LATICACIDVEN  --  7.6* 4.3*  --    ??  Diarrhea CT abdomen pelvis showing fluid-filled colon with scattered air-fluid levels which could reflect a diarrheal illness.  Patient is unable to recall all the events prior to presentation.  Per nursing staff, she has not had any diarrhea while she is in the ED for several hours.  Continue to monitor. C. difficile PCR and GI pathogen panel, presumptive enteric precautions   Rhabdomyolysis Patient was found down on the floor for greater than 24 hours.   Continue IV fluid hydration CK trend as below. Recent Labs  Lab 07/15/22 2230 07/16/22 0515  CKTOTAL 1,905* 2,971*    AKI Baseline creatinine less than 1.  Presented with creatinine elevated 1.92, serum bicarb low at 15..  Likely prerenal in the setting of severe sepsis, dehydration and rhabdomyolysis.  CT without  evidence of obstructive uropathy. Continue IV fluid hydration Monitor renal function Recent Labs    06/22/22 0320 06/23/22 0327 06/24/22 0422 06/25/22 0347 06/30/22 1353 07/07/22 2232 07/09/22 0513 07/10/22 0554 07/15/22 2243 07/16/22 0515  BUN 7* 6* <5* <5* 5* 6* 8 7* 19 20  CREATININE 0.82 0.83 0.77 0.81 0.95 0.80 0.75 0.55 1.97* 1.92*  CO2 _0 20* 19* 21* 17* 15* 21*   Elevated lipase, AST, ammonia No history of liver disease CT abdomen pelvis without any hepatobiliary or pancreatic etiology. Continue to monitor.  Repeat labs tomorrow. Obtain acute hepatitis panel Recent Labs  Lab 07/10/22 0554 07/11/22 0553 07/12/22 0556 07/13/22 0609 07/15/22 2243 07/16/22 0134 07/16/22 0515  AST 10* 11* 10* 9* 84*  --  149*  ALT _1 33  --  44  ALKPHOS 74 69 79 65 80  --  78  BILITOT 1.6* 1.4* 1.3* 1.3* 1.6*  --  1.6*  BILIDIR 0.5* 0.4* 0.6* 0.6*  --   --   --   IBILI 1.1* 1.0* 0.7 0.7  --   --   --   PROT 5.2* 5.1* 5.3* 4.8* 5.2*  --  5.1*  ALBUMIN 2.6* 2.1* 2.4* 2.3* 2.3*  --  2.3*  AMMONIA  --   --   --   --   --  37*  --   INR  --   --   --   --   --  1.5*  --   LIPASE 218* 140* 105* 100* 132*  --   --   PLT  --  265  --   --  309  --  293      Acute metabolic encephalopathy Likely secondary to severe sepsis/infection/hypothermia and dehydration.   CT head unremarkable.  Ammonia level not elevated.  No evidence of meningeal signs. Improving mental status   Chronic normocytic anemia Hemoglobin has been running less than 10 since she was hospitalized in October.  Presented with hemoglobin of 7.9.  No evidence of active bleeding.  Anemia panel as below.   Continue PPI, iron supplements, folic acid Recent Labs    07/09/22 0513 07/09/22 1606 07/11/22 0553 07/12/22 0556 07/15/22 2243 07/16/22 0515  HGB 8.9*  --  7.2* 8.0* 7.9* 7.9*  MCV 81.0  --  82.6  --  80.4 80.1  VITAMINB12  --  1,104*  --   --   --   --   FOLATE  --  5.9*  --   --   --   --   FERRITIN  --  269  --   --   --   --   TIBC  --  160*  --   --   --   --   IRON  --  80  --   --   --   --    Mild elevation of transaminases Likely due to rhabdomyolysis.   CT without evidence of acute hepatobiliary abnormality. Recent Labs  Lab 07/11/22 0553 07/12/22 0556 07/13/22 0609 07/15/22 2243 07/16/22 0515  AST 11* 10* 9* 84* 149*  ALT _2 33  44  ALKPHOS 69 79 65 80 78  BILITOT 1.4* 1.3* 1.3* 1.6* 1.6*  PROT 5.1* 5.3* 4.8* 5.2* 5.1*  ALBUMIN 2.1* 2.4* 2.3* 2.3* 2.3*    Hypertension PTA seems to on multiple medication but pharmacy has not finalized her home meds yet.  Her blood pressure remains controlled at this time.  Meds on hold.   Hyperlipidemia Statin on hold due to elevated transaminases  Anxiety/depression Continue duloxetine 40 mg daily   Goals of care   Code Status: Full Code    Mobility: Needs PT eval  Infusions:   aztreonam     lactated ringers 125 mL/hr at 07/16/22 1133   metronidazole Stopped (07/16/22 1137)   [START ON 07/18/2022] vancomycin      Scheduled Meds:  Chlorhexidine Gluconate Cloth  6 each Topical Daily    PRN meds: mouth rinse   Skin assessment:     Nutritional status:  Body mass index is 30.05 kg/m.          Diet:  Diet Order             Diet NPO time specified  Diet effective now                   DVT prophylaxis:  Place and maintain sequential compression device Start: 07/16/22 0507   Antimicrobials: Broad-spectrum antibiotics Fluid: LR at 125 mill per hour Consultants: None Family Communication: None at bedside  Status is: Inpatient  Continue in-hospital care because: Needs IV fluid, further monitoring for mental status Level of care: Stepdown   Dispo: The patient is from: Home              Anticipated d/c is to: Pending clinical course              Patient currently is not medically stable to d/c.   Difficult to place patient No    Antimicrobials: Anti-infectives (From admission, onward)    Start     Dose/Rate Route Frequency Ordered Stop   07/18/22 0100  vancomycin (VANCOCIN) IVPB 1000 mg/200 mL premix        1,000 mg 200 mL/hr over 60 Minutes Intravenous Every 48 hours 07/16/22 0506     07/16/22 1200  aztreonam (AZACTAM) 1 g in sodium chloride 0.9 % 100 mL IVPB        1 g 200 mL/hr over 30 Minutes Intravenous Every 8 hours 07/16/22  0506     07/16/22 1100  metroNIDAZOLE (FLAGYL) IVPB 500 mg        500 mg 100 mL/hr over 60 Minutes Intravenous Every 12 hours 07/16/22 0447     07/16/22 0500  aztreonam (AZACTAM) 2 g in sodium chloride 0.9 % 100 mL IVPB  Status:  Discontinued        2 g 200 mL/hr over 30 Minutes Intravenous  Once 07/16/22 0446 07/16/22 0451   07/16/22 0500  metroNIDAZOLE (FLAGYL) IVPB 500 mg  Status:  Discontinued        500 mg 100 mL/hr over 60 Minutes Intravenous Every 12 hours 07/16/22 0446 07/16/22 0447   07/16/22 0500  vancomycin (VANCOCIN) IVPB 1000 mg/200 mL premix  Status:  Discontinued        1,000 mg 200 mL/hr over 60 Minutes Intravenous  Once 07/16/22 0446 07/16/22 0447   07/15/22 2330  meropenem (MERREM) 1 g in sodium chloride 0.9 % 100 mL IVPB        1 g 200 mL/hr over 30 Minutes Intravenous  Once 07/15/22 2318 07/16/22 0107   07/15/22 2315  metroNIDAZOLE (FLAGYL) IVPB 500 mg        500 mg 100 mL/hr over 60 Minutes Intravenous  Once 07/15/22 2302 07/16/22 0107   07/15/22 2315  vancomycin (VANCOCIN) IVPB 1000 mg/200 mL premix  Status:  Discontinued        1,000  mg 200 mL/hr over 60 Minutes Intravenous  Once 07/15/22 2302 07/15/22 2305   07/15/22 2315  vancomycin (VANCOREADY) IVPB 2000 mg/400 mL        2,000 mg 200 mL/hr over 120 Minutes Intravenous  Once 07/15/22 2306 07/16/22 0314       Objective: Vitals:   07/16/22 1100 07/16/22 1200  BP: (!) 148/78   Pulse: (!) 110 (!) 110  Resp: 17 14  Temp: 97.9 F (36.6 C) (!) 97.5 F (36.4 C)  SpO2: 99% 100%    Intake/Output Summary (Last 24 hours) at 07/16/2022 1429 Last data filed at 07/16/2022 0455 Gross per 24 hour  Intake 3590.74 ml  Output --  Net 3590.74 ml   Filed Weights   07/15/22 2123 07/16/22 1049  Weight: 93.4 kg 81.9 kg   Weight change:  Body mass index is 30.05 kg/m.   Physical Exam: General exam: Pleasant, elderly African-American female.  Not in pain Skin: No rashes, lesions or ulcers. HEENT: Atraumatic,  normocephalic, no obvious bleeding Lungs: Clear to auscultation bilaterally CVS: Regular rate and rhythm, no murmur GI/Abd soft, nontender, nondistended, bowel sound present CNS: Alert, awake, oriented to place only Psychiatry: Sad affect Extremities: No pedal edema, no calf tenderness  Data Review: I have personally reviewed the laboratory data and studies available.  F/u labs ordered Unresulted Labs (From admission, onward)     Start     Ordered   07/17/22 0500  CBC with Differential/Platelet  Daily,   R      07/16/22 1427   07/17/22 0500  Lactic acid, plasma  Tomorrow morning,   R        07/16/22 1427   07/17/22 0500  Comprehensive metabolic panel  Daily,   R      07/16/22 1429   07/17/22 0500  Ammonia  Tomorrow morning,   R        07/16/22 1429   07/17/22 0500  Hepatitis panel, acute  Tomorrow morning,   R        07/16/22 1429   07/16/22 0550  MRSA Next Gen by PCR, Nasal  Once,   R        07/16/22 0550   07/16/22 0449  C Difficile Quick Screen w PCR reflex  (C Difficile quick screen w PCR reflex panel )  Once, for 24 hours,   TIMED       References:    CDiff Information Tool   07/16/22 0448   07/16/22 0449  Gastrointestinal Panel by PCR , Stool  (Gastrointestinal Panel by PCR, Stool                                                                                                                                                     **Does Not include CLOSTRIDIUM DIFFICILE testing. **If CDIFF testing is needed, place order  from the "C Difficile Testing" order set.**)  Once,   R        07/16/22 0448   07/15/22 2259  Urine Culture  (Septic presentation on arrival (screening labs, nursing and treatment orders for obvious sepsis))  ONCE - URGENT,   URGENT       Question:  Indication  Answer:  Dysuria   07/15/22 2302   07/15/22 2130  Culture, blood (routine x 2)  BLOOD CULTURE X 2,   R      07/15/22 2129            Total time spent in review of labs and imaging, patient  evaluation, formulation of plan, documentation and communication with family - 34 minutes   Signed, Terrilee Croak, MD [facility] 07/16/2022

## 2022-07-16 NOTE — ED Notes (Signed)
Pt's temp up to 97.5. Bear hugger d/c at this time. Will continue to monitor

## 2022-07-16 NOTE — H&P (Signed)
History and Physical    Stephanie Benton OJJ:[id number] DOB: 29-Jan-1951 DOA: 07/15/2022  PCP: [name], MD  Patient coming from: Home  Chief Complaint: Altered mental status  HPI: Stephanie Benton is a 71 y.o. female with medical history significant of hypertension, hyperlipidemia, chronic abdominal pain, anxiety and bipolar disorder, history of cholecystectomy 2 months ago.  Recently admitted 12/3-12/9 for acute on chronic abdominal pain felt to be due to acute on chronic pancreatitis.  She was also noted to be anemic and had an EGD done which showed esophagitis.  Patient presents to the ED via EMS for altered mental status.  Reportedly she slid off her bed 12/10 and was on the floor since then.  Family went to check on her yesterday and called EMS.  On arrival to the ED, patient was very cool to touch and rectal temperature 92.2 F.  She was placed on [facility].  Labs showing WBC count 12.9, hemoglobin 7.9 (was 8.0 on labs done 4 days ago), bicarb 15, glucose 131, creatinine 1.9 (baseline 0.5-0.8) AST 84, T. bili 1.6, ALT and alk phos normal, lipase 132, blood cultures pending, CK 1905, lactic acid 7.6> 4.3, COVID and influenza PCR negative, UA not suggestive of infection, INR 1.5, ammonia 37, VBG pending.  Chest x-ray not suggestive of pneumonia.  CT abdomen pelvis showing fluid-filled colon with scattered air-fluid levels which could reflect a diarrheal illness.  CT head/C-spine and x-rays of right knee and bilateral hips/pelvis negative for acute finding. Patient was given vancomycin, meropenem, metronidazole, and 2 L LR boluses.  TRH called to admit.  Patient is currently somnolent but arousable.  She is able to tell me her name but not able to answer any other questions.  Review of Systems:  Review of Systems  Reason unable to perform ROS: AMS.    Past Medical History:  Diagnosis Date   Hyperlipidemia    Hypertension    Prolonged QT interval 06/21/2022    Past Surgical  History:  Procedure Laterality Date   ABDOMINAL HYSTERECTOMY     APPENDECTOMY     BIOPSY  05/17/2022   Procedure: BIOPSY;  Surgeon: Carol Ada, MD;  Location: [facility] ENDOSCOPY;  Service: Gastroenterology;;   CHOLECYSTECTOMY N/A 05/22/2022   Procedure: LAPAROSCOPIC CHOLECYSTECTOMY WITH INTRAOPERATIVE CHOLANGIOGRAM;  Surgeon: Coralie Keens, MD;  Location: [place];  Service: General;  Laterality: N/A;   ESOPHAGOGASTRODUODENOSCOPY (EGD) WITH PROPOFOL N/A 05/17/2022   Procedure: ESOPHAGOGASTRODUODENOSCOPY (EGD) WITH PROPOFOL;  Surgeon: Carol Ada, MD;  Location: [place];  Service: Gastroenterology;  Laterality: N/A;   ESOPHAGOGASTRODUODENOSCOPY (EGD) WITH PROPOFOL N/A 07/12/2022   Procedure: ESOPHAGOGASTRODUODENOSCOPY (EGD) WITH PROPOFOL;  Surgeon: Carol Ada, MD;  Location: [facility] ENDOSCOPY;  Service: Gastroenterology;  Laterality: N/A;   HERNIA REPAIR     TONSILLECTOMY     TUBAL LIGATION       reports that she has been smoking cigarettes. She has been smoking an average of .75 packs per day. She has never used smokeless tobacco. She reports that she does not drink alcohol and does not use drugs.  Allergies  Allergen Reactions   Fish-Derived Products Anaphylaxis   Morphine And Related Anaphylaxis and Swelling   Belsomra [Suvorexant] Other (See Comments)    Caused nightmares   Codeine Nausea And Vomiting   Fentanyl Hives   Ibuprofen Nausea Only and Other (See Comments)    Severe nausea- history of stomach ulcers   Nsaids Other (See Comments)    History of stomach ulcers   Other Other (See  Comments)    History of stomach ulcers   Penicillins Hives    Did it involve swelling of the face/tongue/throat, SOB, or low BP? Yes Did it involve sudden or severe rash/hives, skin peeling, or any reaction on the inside of your mouth or nose? Yes Did you need to seek medical attention at a hospital or doctor's office? Yes When did it last happen? More than 10 years ago If all above answers  are "NO", may proceed with cephalosporin use.    Sulfa Antibiotics Swelling    Site of swelling not recalled   Tolmetin Other (See Comments)    History of stomach ulcers    Family History  Problem Relation Age of Onset   Heart failure Mother    Cancer Father    Cancer Sister     Prior to Admission medications   Medication Sig Start Date End Date Taking? Authorizing Provider  albuterol (PROVENTIL) (2.5 MG/3ML) 0.083% nebulizer solution Take 3 mLs (2.5 mg total) by nebulization every 6 (six) hours as needed for wheezing or shortness of breath. 11/27/14   Fransico Meadow, PA-C  albuterol (VENTOLIN HFA) 108 (90 Base) MCG/ACT inhaler Inhale 2 puffs into the lungs every 6 (six) hours as needed for wheezing or shortness of breath.    [provider]  dexlansoprazole (DEXILANT) 60 MG capsule Take 60 mg by mouth daily. 06/14/22   [provider]  diclofenac sodium (VOLTAREN) 1 % GEL Apply 2 g topically daily as needed (for knee pain). 12/04/17   [provider]  dorzolamide (TRUSOPT) 2 % ophthalmic solution Place 1 drop into both eyes 2 (two) times daily.    [provider]  DULoxetine 40 MG CPEP Take 40 mg by mouth daily. 06/25/22 06/25/23  Lavina Hamman, MD  feeding supplement (ENSURE ENLIVE / ENSURE PLUS) LIQD Take 237 mLs by mouth 2 (two) times daily between meals. Patient taking differently: Take 237 mLs by mouth daily. 06/25/22   Lavina Hamman, MD  FEROSUL 325 (65 Fe) MG tablet Take 325 mg by mouth daily with breakfast. 01/24/22   [provider]  folic acid (FOLVITE) 1 MG tablet Take 1 tablet (1 mg total) by mouth daily. 07/14/22 08/13/22  Barb Merino, MD  latanoprost (XALATAN) 0.005 % ophthalmic solution Place 1 drop into both eyes at bedtime. 02/16/18   [provider]  ondansetron (ZOFRAN) 4 MG tablet Take 1 tablet (4 mg total) by mouth every 8 (eight) hours as needed for up to 14 days for nausea or vomiting. 07/13/22 07/27/22   Barb Merino, MD  polyethylene glycol powder (GLYCOLAX/MIRALAX) 17 GM/SCOOP powder Take 1 cap full (17 g) with water by mouth daily. Patient taking differently: Take 17 g by mouth daily as needed for mild constipation. 05/24/22   Elodia Florence., MD  pregabalin (LYRICA) 100 MG capsule Take 1 capsule (100 mg total) by mouth 3 (three) times daily. 06/25/22   Lavina Hamman, MD  risperiDONE (RISPERDAL) 1 MG tablet Take 1 mg by mouth at bedtime.  11/24/14   [provider]  sertraline (ZOLOFT) 100 MG tablet Take 100 mg by mouth at bedtime.  05/05/13   [provider]  simvastatin (ZOCOR) 40 MG tablet Take 40 mg by mouth at bedtime.  05/09/15   [provider]  sucralfate (CARAFATE) 1 GM/10ML suspension Take 10 mLs (1 g total) by mouth 2 (two) times daily. 06/25/22 07/25/22  Lavina Hamman, MD  SYMBICORT 160-4.5 MCG/ACT inhaler Inhale 1  puff into the lungs 2 (two) times daily as needed (for flares).  01/28/18   [provider]  telmisartan (MICARDIS) 80 MG tablet Take 80 mg by mouth at bedtime.  06/24/18   [provider]  tolterodine (DETROL LA) 2 MG 24 hr capsule Take 2 mg by mouth daily. 05/15/22   [provider]  torsemide (DEMADEX) 10 MG tablet Take 10 mg by mouth daily as needed (for swelling in legs). 05/06/22   [provider]  traMADol (ULTRAM) 50 MG tablet Take 1 tablet (50 mg total) by mouth every 8 (eight) hours as needed for severe pain or moderate pain. 06/25/22   Lavina Hamman, MD    Physical Exam: Vitals:   07/16/22 0230 07/16/22 0245 07/16/22 0300 07/16/22 0330  BP: 136/63 136/68 134/70 133/64  Pulse: 97 99 (!) 101 (!) 105  Resp: _0 Temp: (!) 94.7 F (34.8 C) (!) 95.1 F (35.1 C) (!) 95.4 F (35.2 C) (!) 96 F (35.6 C)  TempSrc:      SpO2: 97% 97% 98% 98%  Weight:      Height:        Physical Exam Vitals reviewed.  Constitutional:      General: She is not in acute distress. HENT:      Head: Normocephalic and atraumatic.  Eyes:     Extraocular Movements: Extraocular movements intact.  Cardiovascular:     Rate and Rhythm: Regular rhythm. Tachycardia present.     Pulses: Normal pulses.     Comments: Heart rate in the 110s Pulmonary:     Effort: Pulmonary effort is normal. No respiratory distress.     Breath sounds: Normal breath sounds. No wheezing or rales.  Abdominal:     General: Bowel sounds are normal. There is no distension.     Palpations: Abdomen is soft.     Tenderness: There is no abdominal tenderness.  Musculoskeletal:     Cervical back: Normal range of motion.     Right lower leg: No edema.     Left lower leg: No edema.  Skin:    General: Skin is warm and dry.  Neurological:     Mental Status: She is alert.     Comments: Somnolent Oriented to self only Not following commands     Labs on Admission: I have personally reviewed following labs and imaging studies  CBC: Recent Labs  Lab 07/09/22 0513 07/11/22 0553 07/12/22 0556 07/15/22 2243  WBC 6.1 6.6  --  12.9*  NEUTROABS  --  5.2  --  11.2*  HGB 8.9* 7.2* 8.0* 7.9*  HCT 27.2* 22.3* 24.5* 23.8*  MCV 81.0 82.6  --  80.4  PLT 273 265  --  573   Basic Metabolic Panel: Recent Labs  Lab 07/09/22 0513 07/10/22 0554 07/15/22 2243  NA 141 140 139  K 2.9* 4.2 3.6  CL 110 113* 110  CO2 21* 17* 15*  GLUCOSE 73 71 131*  BUN 8 7* 19  CREATININE 0.75 0.55 1.97*  CALCIUM 8.6* 8.3* 8.8*   GFR: Estimated Creatinine Clearance: 29.2 mL/min (A) (by C-G formula based on SCr of 1.97 mg/dL (H)). Liver Function Tests: Recent Labs  Lab 07/10/22 0554 07/11/22 0553 07/12/22 0556 07/13/22 0609 07/15/22 2243  AST 10* 11* 10* 9* 84*  ALT _1 33  ALKPHOS 74 69 79 65 80  BILITOT 1.6* 1.4* 1.3* 1.3* 1.6*  PROT 5.2* 5.1* 5.3* 4.8* 5.2*  ALBUMIN 2.6*  2.1* 2.4* 2.3* 2.3*   Recent Labs  Lab 07/10/22 0554 07/11/22 0553 07/12/22 0556 07/13/22 0609 07/15/22 2243  LIPASE 218* 140* 105* 100*  132*   Recent Labs  Lab 07/16/22 0134  AMMONIA 37*   Coagulation Profile: Recent Labs  Lab 07/16/22 0134  INR 1.5*   Cardiac Enzymes: Recent Labs  Lab 07/15/22 2230  CKTOTAL 1,905*   BNP (last 3 results) No results for input(s): "PROBNP" in the last 8760 hours. HbA1C: No results for input(s): "HGBA1C" in the last 72 hours. CBG: No results for input(s): "GLUCAP" in the last 168 hours. Lipid Profile: No results for input(s): "CHOL", "HDL", "LDLCALC", "TRIG", "CHOLHDL", "LDLDIRECT" in the last 72 hours. Thyroid Function Tests: No results for input(s): "TSH", "T4TOTAL", "FREET4", "T3FREE", "THYROIDAB" in the last 72 hours. Anemia Panel: No results for input(s): "VITAMINB12", "FOLATE", "FERRITIN", "TIBC", "IRON", "RETICCTPCT" in the last 72 hours. Urine analysis:    Component Value Date/Time   COLORURINE YELLOW 07/16/2022 0024   APPEARANCEUR CLEAR 07/16/2022 0024   LABSPEC 1.008 07/16/2022 0024   PHURINE 5.0 07/16/2022 0024   GLUCOSEU NEGATIVE 07/16/2022 0024   HGBUR LARGE (A) 07/16/2022 0024   BILIRUBINUR NEGATIVE 07/16/2022 0024   KETONESUR NEGATIVE 07/16/2022 0024   PROTEINUR NEGATIVE 07/16/2022 0024   UROBILINOGEN 1.0 01/11/2010 1841   NITRITE NEGATIVE 07/16/2022 0024   LEUKOCYTESUR NEGATIVE 07/16/2022 0024    Radiological Exams on Admission: DG Hips Bilat W or Wo Pelvis 3-4 Views  Result Date: 07/15/2022 CLINICAL DATA:  Recent fall out of bed with leg pain, initial encounter EXAM: DG HIP (WITH OR WITHOUT PELVIS) 4V BILAT COMPARISON:  None Available. FINDINGS: Pelvic ring is intact. Degenerative changes of the hip joints are noted bilaterally. No acute fracture or dislocation is noted. No soft tissue abnormality is seen. IMPRESSION: Degenerative changes without acute abnormality. Electronically Signed   By: Inez Catalina M.D.   On: 07/15/2022 23:43   DG Knee Complete 4 Views Right  Result Date: 07/15/2022 CLINICAL DATA:  Recent fall with leg pain, initial  encounter EXAM: RIGHT KNEE - COMPLETE 4+ VIEW COMPARISON:  None Available. FINDINGS: Degenerative changes are noted most prominent in the patellofemoral and medial joint spaces. No acute fracture or dislocation is noted. No joint effusion is seen. IMPRESSION: Degenerative change without acute abnormality. Electronically Signed   By: Inez Catalina M.D.   On: 07/15/2022 23:41   DG Chest 2 View  Result Date: 07/15/2022 CLINICAL DATA:  Recent fall from bed 1 day ago with chest pain, initial encounter EXAM: CHEST - 2 VIEW COMPARISON:  06/30/2022 FINDINGS: Cardiac shadow is within normal limits. Hiatal hernia is seen. Lungs are well aerated bilaterally. No focal infiltrate or sizable effusion is seen. No bony abnormality is noted. IMPRESSION: No active cardiopulmonary disease. Electronically Signed   By: Inez Catalina M.D.   On: 07/15/2022 23:40   CT ABDOMEN PELVIS WO CONTRAST  Result Date: 07/15/2022 CLINICAL DATA:  Abdominal pain, postop.  Found on floor. EXAM: CT ABDOMEN AND PELVIS WITHOUT CONTRAST TECHNIQUE: Multidetector CT imaging of the abdomen and pelvis was performed following the standard protocol without IV contrast. RADIATION DOSE REDUCTION: This exam was performed according to the departmental dose-optimization program which includes automated exposure control, adjustment of the mA and/or kV according to patient size and/or use of iterative reconstruction technique. COMPARISON:  07/08/2022 FINDINGS: Lower chest: Moderate-sized hiatal hernia, stable. No effusions or acute findings. Hepatobiliary: No focal liver abnormality is seen. Status post cholecystectomy. No biliary dilatation. Pancreas: No focal  abnormality or ductal dilatation. Spleen: No focal abnormality.  Normal size. Adrenals/Urinary Tract: No adrenal abnormality. No focal renal abnormality. No stones or hydronephrosis. Urinary bladder is unremarkable. Stomach/Bowel: Stomach and small bowel decompressed. Colon is fluid-filled with scattered  air-fluid levels. This could reflect diarrheal illness. No evidence of bowel obstruction. Vascular/Lymphatic: Aortic atherosclerosis. No evidence of aneurysm or adenopathy. Reproductive: Prior hysterectomy.  No adnexal masses. Other: No free fluid or free air. Small umbilical hernia containing fat, stable. Stable chronic ovoid fluid collection along the anterior abdominal wall above the umbilicus measuring 6.4 x 4.3 cm associated with ventral mesh hernia repair. Calcified structure in the overlying anterior subcutaneous soft tissues measures 2.4 cm, stable. Musculoskeletal: No acute bony abnormality. IMPRESSION: Stable moderate-sized hiatal hernia. Colon is fluid-filled with scattered air-fluid levels. This could reflect a diarrheal illness. No evidence of bowel obstruction. Stable anterior chronic fluid collection along the anterior mesh from prior ventral hernia repair. Aortic atherosclerosis. Electronically Signed   By: Rolm Baptise M.D.   On: 07/15/2022 23:20   CT Cervical Spine Wo Contrast  Result Date: 07/15/2022 CLINICAL DATA:  Neck trauma (Age >= 65y).  Found and EXAM: CT CERVICAL SPINE WITHOUT CONTRAST TECHNIQUE: Multidetector CT imaging of the cervical spine was performed without intravenous contrast. Multiplanar CT image reconstructions were also generated. RADIATION DOSE REDUCTION: This exam was performed according to the departmental dose-optimization program which includes automated exposure control, adjustment of the mA and/or kV according to patient size and/or use of iterative reconstruction technique. COMPARISON:  None Available. FINDINGS: Alignment: Normal Skull base and vertebrae: No acute fracture. No primary bone lesion or focal pathologic process. Soft tissues and spinal canal: No prevertebral fluid or swelling. No visible canal hematoma. Disc levels: Maintained. Early anterior spurring. Mild bilateral degenerative facet disease. Upper chest: No acute findings Other: None IMPRESSION: No  acute bony abnormality. Electronically Signed   By: Rolm Baptise M.D.   On: 07/15/2022 23:13   CT Head Wo Contrast  Result Date: 07/15/2022 CLINICAL DATA:  Mental status change, unknown cause found down EXAM: CT HEAD WITHOUT CONTRAST TECHNIQUE: Contiguous axial images were obtained from the base of the skull through the vertex without intravenous contrast. RADIATION DOSE REDUCTION: This exam was performed according to the departmental dose-optimization program which includes automated exposure control, adjustment of the mA and/or kV according to patient size and/or use of iterative reconstruction technique. COMPARISON:  None Available. FINDINGS: Brain: There is atrophy and chronic small vessel disease changes. No acute intracranial abnormality. Specifically, no hemorrhage, hydrocephalus, mass lesion, acute infarction, or significant intracranial injury. Vascular: No hyperdense vessel or unexpected calcification. Skull: No acute calvarial abnormality. Sinuses/Orbits: No acute findings Other: None IMPRESSION: Atrophy, chronic microvascular disease. No acute intracranial abnormality. Electronically Signed   By: Rolm Baptise M.D.   On: 07/15/2022 23:10    EKG: Independently reviewed.  Sinus rhythm, diffuse borderline T wave abnormalities.  Assessment and Plan  Hypothermia Severe sepsis Meets criteria for severe sepsis with hypothermia, tachycardia, leukocytosis, and lactic acidosis.  COVID and influenza PCR negative.  UA not suggestive of infection.  Chest x-ray not suggestive of pneumonia. CT abdomen pelvis showing fluid-filled colon with scattered air-fluid levels which could reflect a diarrheal illness.  Patient was placed on Bair hugger and temperature now improved from 92.2 F>> 96.6 F.  Lactate improved from 7.6> 4.3 with IV fluids.  Patient is normotensive. -Continue broad-spectrum antibiotics at this time including vancomycin, aztreonam, and metronidazole. -Continue IV fluid hydration -Urine and  blood cultures pending -  Monitor WBC count -C. difficile PCR and GI pathogen panel, enteric precautions  Rhabdomyolysis Patient was found down on the floor for greater than 24 hours.  CK 1905. -Continue IV fluid hydration -Trend CK  AKI Likely prerenal in the setting of severe sepsis and dehydration.  Creatinine 1.9, baseline 0.5-0.8.  CT without evidence of obstructive uropathy. -Continue IV fluid hydration -Monitor renal function -Avoid nephrotoxic agents  Acute metabolic encephalopathy Likely secondary to severe sepsis/infection/hypothermia and dehydration.  Patient is currently somnolent but arousable.  CT head negative for acute finding.  No significant elevation of ammonia level.  No meningeal signs. -Continue management as mentioned above and monitor closely -Check TSH and B12 levels  Anemia Hemoglobin 7.9, stable since labs done 4 days ago.  EGD done 4 days ago showed esophagitis but no bleeding. -Type and screen -Monitor H&H, transfuse PRBCs if hemoglobin less than 7  Metabolic acidosis Likely due to severe lactic acidosis and AKI.  Bicarb 15. -VBG pending to check pH -Patient received IV fluids.  Repeat labs ordered to check bicarb level, replace if not improving.  Mild elevation of liver enzymes Likely due to severe sepsis.  History of cholecystectomy and CT without evidence of acute hepatobiliary abnormality. -Repeat LFTs  Hypertension -Avoid antihypertensives at this time in setting of severe sepsis.  Hyperlipidemia -Hold statin given rhabdomyolysis.  DVT prophylaxis: SCDs Code Status: Full Code Family Communication: No family available at this time. Level of care: Step Down Unit Admission status: It is my clinical opinion that admission to INPATIENT is reasonable and necessary because of the expectation that this patient will require hospital care that crosses at least 2 midnights to treat this condition based on the medical complexity of the problems  presented.  Given the aforementioned information, the predictability of an adverse outcome is felt to be significant.   Shela Leff MD [facility]  If 7PM-7AM, please contact night-coverage [email or web address]  07/16/2022, 3:47 AM

## 2022-07-16 NOTE — ED Notes (Signed)
Phlebotomy called for morning lab draw, difficult stick.

## 2022-07-16 NOTE — ED Notes (Signed)
ED TO INPATIENT HANDOFF REPORT  ED Nurse Name and Phone #: Delice Bison, RN  S Name/Age/Gender Stephanie Benton 71 y.o. female Room/Bed: WA10/WA10  Code Status   Code Status: Full Code  Home/SNF/Other Home Patient oriented to: self Is this baseline? No   Triage Complete: Triage complete  Chief Complaint Severe sepsis (HCC) [A41.9, R65.20]  Triage Note Pt to ED via EMS from home. EMS states that pt was found on the floor today. Pt states she slid off of the bed yesterday and has been on the floor since. She has an abrasion noted to R knee. Pt c/o lower abd pain. Pt lethargic but able to answer questions. Pt very cool to touch. Temp 92.2 in room rectally. EMS gave NS. Pt A/ox3  CBG 181   Allergies Allergies  Allergen Reactions   Fish-Derived Products Anaphylaxis   Morphine And Related Anaphylaxis and Swelling   Belsomra [Suvorexant] Other (See Comments)    Caused nightmares   Codeine Nausea And Vomiting   Fentanyl Hives   Ibuprofen Nausea Only and Other (See Comments)    Severe nausea- history of stomach ulcers   Nsaids Other (See Comments)    History of stomach ulcers   Other Other (See Comments)    History of stomach ulcers   Penicillins Hives    Did it involve swelling of the face/tongue/throat, SOB, or low BP? Yes Did it involve sudden or severe rash/hives, skin peeling, or any reaction on the inside of your mouth or nose? Yes Did you need to seek medical attention at a hospital or doctor's office? Yes When did it last happen? More than 10 years ago If all above answers are "NO", may proceed with cephalosporin use.    Sulfa Antibiotics Swelling    Site of swelling not recalled   Tolmetin Other (See Comments)    History of stomach ulcers    Level of Care/Admitting Diagnosis ED Disposition     ED Disposition  Admit   Condition  --   Comment  Hospital Area: [facility] [[id number]]  Level of Care: Stepdown [14]  Admit to SDU based on  following criteria: Severe physiological/psychological symptoms:  Any diagnosis requiring assessment & intervention at least every 4 hours on an ongoing basis to obtain desired patient outcomes including stability and rehabilitation  May admit patient to [facility] or [facility] if equivalent level of care is available:: Yes  Covid Evaluation: Asymptomatic - no recent exposure (last 10 days) testing not required  Diagnosis: Severe sepsis [facility]) [[id number]]  Admitting Physician: John Giovanni [[id number]]  Attending Physician: John Giovanni [[id number]]  Certification:: I certify this patient will need inpatient services for at least 2 midnights  Estimated Length of Stay: 2          B Medical/Surgery History Past Medical History:  Diagnosis Date   Hyperlipidemia    Hypertension    Prolonged QT interval 06/21/2022   Past Surgical History:  Procedure Laterality Date   ABDOMINAL HYSTERECTOMY     APPENDECTOMY     BIOPSY  05/17/2022   Procedure: BIOPSY;  Surgeon: Jeani Hawking, MD;  Location: [facility] ENDOSCOPY;  Service: Gastroenterology;;   CHOLECYSTECTOMY N/A 05/22/2022   Procedure: LAPAROSCOPIC CHOLECYSTECTOMY WITH INTRAOPERATIVE CHOLANGIOGRAM;  Surgeon: Abigail Miyamoto, MD;  Location: [facility] OR;  Service: General;  Laterality: N/A;   ESOPHAGOGASTRODUODENOSCOPY (EGD) WITH PROPOFOL N/A 05/17/2022   Procedure: ESOPHAGOGASTRODUODENOSCOPY (EGD) WITH PROPOFOL;  Surgeon: Jeani Hawking, MD;  Location: [facility] ENDOSCOPY;  Service: Gastroenterology;  Laterality: N/A;  ESOPHAGOGASTRODUODENOSCOPY (EGD) WITH PROPOFOL N/A 07/12/2022   Procedure: ESOPHAGOGASTRODUODENOSCOPY (EGD) WITH PROPOFOL;  Surgeon: Jeani Hawking, MD;  Location: [facility] ENDOSCOPY;  Service: Gastroenterology;  Laterality: N/A;   HERNIA REPAIR     TONSILLECTOMY     TUBAL LIGATION       A IV Location/Drains/Wounds Patient Lines/Drains/Airways Status     Active Line/Drains/Airways     Name Placement date Placement time Site Days    Peripheral IV 07/15/22 22 G 2.5" Anterior;Left;Upper Arm 07/15/22  2248  Arm  1   Peripheral IV 07/15/22 24 G Anterior;Left;Proximal Forearm 07/15/22  2249  Forearm  1   Urethral Catheter Temperature probe 14 Fr. 07/16/22  0010  Temperature probe  less than 1   Incision (Closed) 05/22/22 Abdomen Other (Comment) 05/22/22  1239  -- 55   Incision - 4 Ports Abdomen 1: Umbilicus 2: Mid;Upper 3: Right;Lower 4: Right;Upper 05/22/22  1245  -- 55            Intake/Output Last 24 hours  Intake/Output Summary (Last 24 hours) at 07/16/2022 0954 Last data filed at 07/16/2022 0455 Gross per 24 hour  Intake 3590.74 ml  Output --  Net 3590.74 ml    Labs/Imaging Results for orders placed or performed during the hospital encounter of 07/15/22 (from the past 48 hour(s))  CK     Status: Abnormal   Collection Time: 07/15/22 10:30 PM  Result Value Ref Range   Total CK 1,905 (H) 38 - 234 U/L    Comment: Performed at [facility], [address], [place], [place] [address]  Comprehensive metabolic panel     Status: Abnormal   Collection Time: 07/15/22 10:43 PM  Result Value Ref Range   Sodium 139 135 - 145 mmol/L   Potassium 3.6 3.5 - 5.1 mmol/L   Chloride 110 98 - 111 mmol/L   CO2 15 (L) 22 - 32 mmol/L   Glucose, Bld 131 (H) 70 - 99 mg/dL    Comment: Glucose reference range applies only to samples taken after fasting for at least 8 hours.   BUN 19 8 - 23 mg/dL   Creatinine, Ser 6.04 (H) 0.44 - 1.00 mg/dL   Calcium 8.8 (L) 8.9 - 10.3 mg/dL   Total Protein 5.2 (L) 6.5 - 8.1 g/dL   Albumin 2.3 (L) 3.5 - 5.0 g/dL   AST 84 (H) 15 - 41 U/L   ALT 33 0 - 44 U/L   Alkaline Phosphatase 80 38 - 126 U/L   Total Bilirubin 1.6 (H) 0.3 - 1.2 mg/dL   GFR, Estimated 27 (L) >60 mL/min    Comment: (NOTE) Calculated using the CKD-EPI Creatinine Equation (2021)    Anion gap 14 5 - 15    Comment: Performed at [facility], [address] [place], [place] [address]   Lactic acid, plasma     Status: Abnormal   Collection Time: 07/15/22 10:43 PM  Result Value Ref Range   Lactic Acid, Venous 7.6 (HH) 0.5 - 1.9 mmol/L    Comment: CRITICAL RESULT CALLED TO, READ BACK BY AND VERIFIED WITH REYNOODS,A RN @ 2326 ON [id number] BY MAHMOUD,S Performed at [facility], [address] [place], [place] [address]   Lipase, blood     Status: Abnormal   Collection Time: 07/15/22 10:43 PM  Result Value Ref Range   Lipase 132 (H) 11 - 51 U/L    Comment: Performed at [facility], [address] [place], [place]  [address]  CBC with Differential     Status: Abnormal   Collection Time: 07/15/22 10:43 PM  Result Value Ref Range   WBC 12.9 (H) 4.0 - 10.5 K/uL   RBC 2.96 (L) 3.87 - 5.11 MIL/uL   Hemoglobin 7.9 (L) 12.0 - 15.0 g/dL   HCT 60.4 (L) 54.0 - 98.1 %   MCV 80.4 80.0 - 100.0 fL   MCH 26.7 26.0 - 34.0 pg   MCHC 33.2 30.0 - 36.0 g/dL   RDW 19.1 (H) 47.8 - 29.5 %   Platelets 309 150 - 400 K/uL   nRBC 1.6 (H) 0.0 - 0.2 %   Neutrophils Relative % 87 %   Neutro Abs 11.2 (H) 1.7 - 7.7 K/uL   Lymphocytes Relative 4 %   Lymphs Abs 0.6 (L) 0.7 - 4.0 K/uL   Monocytes Relative 7 %   Monocytes Absolute 0.9 0.1 - 1.0 K/uL   Eosinophils Relative 0 %   Eosinophils Absolute 0.0 0.0 - 0.5 K/uL   Basophils Relative 0 %   Basophils Absolute 0.0 0.0 - 0.1 K/uL   Immature Granulocytes 2 %   Abs Immature Granulocytes 0.21 (H) 0.00 - 0.07 K/uL    Comment: Performed at [facility], [address], [place], [place] [address]  Resp Panel by RT-PCR (Flu A&B, Covid) Anterior Nasal Swab     Status: None   Collection Time: 07/15/22 11:38 PM   Specimen: Anterior Nasal Swab  Result Value Ref Range   SARS Coronavirus 2 by RT PCR NEGATIVE NEGATIVE    Comment: (NOTE) SARS-CoV-2 target nucleic acids are NOT DETECTED.  The SARS-CoV-2 RNA is generally detectable in upper respiratory specimens during the acute phase of infection.  The lowest concentration of SARS-CoV-2 viral copies this assay can detect is 138 copies/mL. A negative result does not preclude SARS-Cov-2 infection and should not be used as the sole basis for treatment or other patient management decisions. A negative result may occur with  improper specimen collection/handling, submission of specimen other than nasopharyngeal swab, presence of viral mutation(s) within the areas targeted by this assay, and inadequate number of viral copies(<138 copies/mL). A negative result must be combined with clinical observations, patient history, and epidemiological information. The expected result is Negative.  Fact Sheet for Patients:  BloggerCourse.com  Fact Sheet for Healthcare Providers:  SeriousBroker.it  This test is no t yet approved or cleared by the [place] FDA and  has been authorized for detection and/or diagnosis of SARS-CoV-2 by FDA under an Emergency Use Authorization (EUA). This EUA will remain  in effect (meaning this test can be used) for the duration of the COVID-19 declaration under Section 564(b)(1) of the Act, 21 U.S.C.section 360bbb-3(b)(1), unless the authorization is terminated  or revoked sooner.       Influenza A by PCR NEGATIVE NEGATIVE   Influenza B by PCR NEGATIVE NEGATIVE    Comment: (NOTE) The Xpert Xpress SARS-CoV-2/FLU/RSV plus assay is intended as an aid in the diagnosis of influenza from Nasopharyngeal swab specimens and should not be used as a sole basis for treatment. Nasal washings and aspirates are unacceptable for Xpert Xpress SARS-CoV-2/FLU/RSV testing.  Fact Sheet for Patients: BloggerCourse.com  Fact Sheet for Healthcare Providers: SeriousBroker.it  This test is not yet approved or cleared by the [place] FDA and has been authorized for detection and/or diagnosis of SARS-CoV-2 by FDA under an  Emergency Use Authorization (EUA). This EUA will remain in effect (meaning this test can be used)  for the duration of the COVID-19 declaration under Section 564(b)(1) of the Act, 21 U.S.C. section 360bbb-3(b)(1), unless the authorization is terminated or revoked.  Performed at [facility], [address], [place], [place] [address]   Urinalysis, Routine w reflex microscopic Urine, Catheterized     Status: Abnormal   Collection Time: 07/16/22 12:24 AM  Result Value Ref Range   Color, Urine YELLOW YELLOW   APPearance CLEAR CLEAR   Specific Gravity, Urine 1.008 1.005 - 1.030   pH 5.0 5.0 - 8.0   Glucose, UA NEGATIVE NEGATIVE mg/dL   Hgb urine dipstick LARGE (A) NEGATIVE   Bilirubin Urine NEGATIVE NEGATIVE   Ketones, ur NEGATIVE NEGATIVE mg/dL   Protein, ur NEGATIVE NEGATIVE mg/dL   Nitrite NEGATIVE NEGATIVE   Leukocytes,Ua NEGATIVE NEGATIVE   RBC / HPF 0-5 0 - 5 RBC/hpf   WBC, UA 0-5 0 - 5 WBC/hpf   Bacteria, UA RARE (A) NONE SEEN   Mucus PRESENT    Hyaline Casts, UA PRESENT     Comment: Performed at [facility], [address] [place], [place] [address]  Lactic acid, plasma     Status: Abnormal   Collection Time: 07/16/22  1:34 AM  Result Value Ref Range   Lactic Acid, Venous 4.3 (HH) 0.5 - 1.9 mmol/L    Comment: CRITICAL VALUE NOTED. VALUE IS CONSISTENT WITH PREVIOUSLY REPORTED/CALLED VALUE Performed at [facility], [address] [place], [place] [address]   Protime-INR     Status: Abnormal   Collection Time: 07/16/22  1:34 AM  Result Value Ref Range   Prothrombin Time 17.6 (H) 11.4 - 15.2 seconds   INR 1.5 (H) 0.8 - 1.2    Comment: (NOTE) INR goal varies based on device and disease states. Performed at [facility], [address], [place], [place] [address]   APTT     Status: Abnormal   Collection Time: 07/16/22  1:34 AM  Result Value Ref Range   aPTT 46 (H) 24 - 36 seconds    Comment:         IF BASELINE aPTT IS ELEVATED, SUGGEST PATIENT RISK ASSESSMENT BE USED TO DETERMINE APPROPRIATE ANTICOAGULANT THERAPY. Performed at [facility], [address] [place], [place] [address]   Ammonia     Status: Abnormal   Collection Time: 07/16/22  1:34 AM  Result Value Ref Range   Ammonia 37 (H) 9 - 35 umol/L    Comment: Performed at [facility], [address] [place], [place] [address]  CBC     Status: Abnormal   Collection Time: 07/16/22  5:15 AM  Result Value Ref Range   WBC 14.5 (H) 4.0 - 10.5 K/uL   RBC 2.96 (L) 3.87 - 5.11 MIL/uL   Hemoglobin 7.9 (L) 12.0 - 15.0 g/dL   HCT 32.4 (L) 40.1 - 02.7 %   MCV 80.1 80.0 - 100.0 fL   MCH 26.7 26.0 - 34.0 pg   MCHC 33.3 30.0 - 36.0 g/dL   RDW 25.3 (H) 66.4 - 40.3 %   Platelets 293 150 - 400 K/uL   nRBC 0.6 (H) 0.0 - 0.2 %    Comment: Performed at [facility], [address] [place], [place] [address]  Comprehensive metabolic panel     Status: Abnormal   Collection Time: 07/16/22  5:15 AM  Result Value Ref Range   Sodium 140 135 - 145 mmol/L   Potassium 3.5  3.5 - 5.1 mmol/L   Chloride 107 98 - 111 mmol/L   CO2 21 (L) 22 - 32 mmol/L   Glucose, Bld 94 70 - 99 mg/dL    Comment: Glucose reference range applies only to samples taken after fasting for at least 8 hours.   BUN 20 8 - 23 mg/dL   Creatinine, Ser 8.75 (H) 0.44 - 1.00 mg/dL   Calcium 9.1 8.9 - 64.3 mg/dL   Total Protein 5.1 (L) 6.5 - 8.1 g/dL   Albumin 2.3 (L) 3.5 - 5.0 g/dL   AST 329 (H) 15 - 41 U/L   ALT 44 0 - 44 U/L   Alkaline Phosphatase 78 38 - 126 U/L   Total Bilirubin 1.6 (H) 0.3 - 1.2 mg/dL   GFR, Estimated 27 (L) >60 mL/min    Comment: (NOTE) Calculated using the CKD-EPI Creatinine Equation (2021)    Anion gap 12 5 - 15    Comment: Performed at [facility], [address], [place], [place] [address]  CK     Status: Abnormal   Collection Time: 07/16/22  5:15 AM  Result  Value Ref Range   Total CK 2,971 (H) 38 - 234 U/L    Comment: Performed at [facility], [address], [place], [place] [address]   DG Hips Bilat W or Wo Pelvis 3-4 Views  Result Date: 07/15/2022 CLINICAL DATA:  Recent fall out of bed with leg pain, initial encounter EXAM: DG HIP (WITH OR WITHOUT PELVIS) 4V BILAT COMPARISON:  None Available. FINDINGS: Pelvic ring is intact. Degenerative changes of the hip joints are noted bilaterally. No acute fracture or dislocation is noted. No soft tissue abnormality is seen. IMPRESSION: Degenerative changes without acute abnormality. Electronically Signed   By: Alcide Clever M.D.   On: 07/15/2022 23:43   DG Knee Complete 4 Views Right  Result Date: 07/15/2022 CLINICAL DATA:  Recent fall with leg pain, initial encounter EXAM: RIGHT KNEE - COMPLETE 4+ VIEW COMPARISON:  None Available. FINDINGS: Degenerative changes are noted most prominent in the patellofemoral and medial joint spaces. No acute fracture or dislocation is noted. No joint effusion is seen. IMPRESSION: Degenerative change without acute abnormality. Electronically Signed   By: Alcide Clever M.D.   On: 07/15/2022 23:41   DG Chest 2 View  Result Date: 07/15/2022 CLINICAL DATA:  Recent fall from bed 1 day ago with chest pain, initial encounter EXAM: CHEST - 2 VIEW COMPARISON:  06/30/2022 FINDINGS: Cardiac shadow is within normal limits. Hiatal hernia is seen. Lungs are well aerated bilaterally. No focal infiltrate or sizable effusion is seen. No bony abnormality is noted. IMPRESSION: No active cardiopulmonary disease. Electronically Signed   By: Alcide Clever M.D.   On: 07/15/2022 23:40   CT ABDOMEN PELVIS WO CONTRAST  Result Date: 07/15/2022 CLINICAL DATA:  Abdominal pain, postop.  Found on floor. EXAM: CT ABDOMEN AND PELVIS WITHOUT CONTRAST TECHNIQUE: Multidetector CT imaging of the abdomen and pelvis was performed following the standard protocol without IV contrast. RADIATION  DOSE REDUCTION: This exam was performed according to the departmental dose-optimization program which includes automated exposure control, adjustment of the mA and/or kV according to patient size and/or use of iterative reconstruction technique. COMPARISON:  07/08/2022 FINDINGS: Lower chest: Moderate-sized hiatal hernia, stable. No effusions or acute findings. Hepatobiliary: No focal liver abnormality is seen. Status post cholecystectomy. No biliary dilatation. Pancreas: No focal abnormality or ductal dilatation. Spleen: No focal abnormality.  Normal size. Adrenals/Urinary Tract: No adrenal  abnormality. No focal renal abnormality. No stones or hydronephrosis. Urinary bladder is unremarkable. Stomach/Bowel: Stomach and small bowel decompressed. Colon is fluid-filled with scattered air-fluid levels. This could reflect diarrheal illness. No evidence of bowel obstruction. Vascular/Lymphatic: Aortic atherosclerosis. No evidence of aneurysm or adenopathy. Reproductive: Prior hysterectomy.  No adnexal masses. Other: No free fluid or free air. Small umbilical hernia containing fat, stable. Stable chronic ovoid fluid collection along the anterior abdominal wall above the umbilicus measuring 6.4 x 4.3 cm associated with ventral mesh hernia repair. Calcified structure in the overlying anterior subcutaneous soft tissues measures 2.4 cm, stable. Musculoskeletal: No acute bony abnormality. IMPRESSION: Stable moderate-sized hiatal hernia. Colon is fluid-filled with scattered air-fluid levels. This could reflect a diarrheal illness. No evidence of bowel obstruction. Stable anterior chronic fluid collection along the anterior mesh from prior ventral hernia repair. Aortic atherosclerosis. Electronically Signed   By: Charlett Nose M.D.   On: 07/15/2022 23:20   CT Cervical Spine Wo Contrast  Result Date: 07/15/2022 CLINICAL DATA:  Neck trauma (Age >= 65y).  Found and EXAM: CT CERVICAL SPINE WITHOUT CONTRAST TECHNIQUE: Multidetector  CT imaging of the cervical spine was performed without intravenous contrast. Multiplanar CT image reconstructions were also generated. RADIATION DOSE REDUCTION: This exam was performed according to the departmental dose-optimization program which includes automated exposure control, adjustment of the mA and/or kV according to patient size and/or use of iterative reconstruction technique. COMPARISON:  None Available. FINDINGS: Alignment: Normal Skull base and vertebrae: No acute fracture. No primary bone lesion or focal pathologic process. Soft tissues and spinal canal: No prevertebral fluid or swelling. No visible canal hematoma. Disc levels: Maintained. Early anterior spurring. Mild bilateral degenerative facet disease. Upper chest: No acute findings Other: None IMPRESSION: No acute bony abnormality. Electronically Signed   By: Charlett Nose M.D.   On: 07/15/2022 23:13   CT Head Wo Contrast  Result Date: 07/15/2022 CLINICAL DATA:  Mental status change, unknown cause found down EXAM: CT HEAD WITHOUT CONTRAST TECHNIQUE: Contiguous axial images were obtained from the base of the skull through the vertex without intravenous contrast. RADIATION DOSE REDUCTION: This exam was performed according to the departmental dose-optimization program which includes automated exposure control, adjustment of the mA and/or kV according to patient size and/or use of iterative reconstruction technique. COMPARISON:  None Available. FINDINGS: Brain: There is atrophy and chronic small vessel disease changes. No acute intracranial abnormality. Specifically, no hemorrhage, hydrocephalus, mass lesion, acute infarction, or significant intracranial injury. Vascular: No hyperdense vessel or unexpected calcification. Skull: No acute calvarial abnormality. Sinuses/Orbits: No acute findings Other: None IMPRESSION: Atrophy, chronic microvascular disease. No acute intracranial abnormality. Electronically Signed   By: Charlett Nose M.D.   On:  07/15/2022 23:10    Pending Labs Unresulted Labs (From admission, onward)     Start     Ordered   07/16/22 0901  Procalcitonin  Once,   R        07/16/22 0901   07/16/22 0550  MRSA Next Gen by PCR, Nasal  Once,   R        07/16/22 0550   07/16/22 0512  TSH  Once,   R        07/16/22 0511   07/16/22 0512  Vitamin B12  Once,   R        07/16/22 0511   07/16/22 0449  C Difficile Quick Screen w PCR reflex  (C Difficile quick screen w PCR reflex panel )  Once, for 24 hours,  TIMED       References:    CDiff Information Tool   07/16/22 0448   07/16/22 0449  Gastrointestinal Panel by PCR , Stool  (Gastrointestinal Panel by PCR, Stool                                                                                                                                                     **Does Not include CLOSTRIDIUM DIFFICILE testing. **If CDIFF testing is needed, place order from the "C Difficile Testing" order set.**)  Once,   R        07/16/22 0448   07/16/22 0235  Blood gas, venous  Once,   R        07/16/22 0235   07/15/22 2259  Urine Culture  (Septic presentation on arrival (screening labs, nursing and treatment orders for obvious sepsis))  ONCE - URGENT,   URGENT       Question:  Indication  Answer:  Dysuria   07/15/22 2302   07/15/22 2130  Culture, blood (routine x 2)  BLOOD CULTURE X 2,   R      07/15/22 2129            Vitals/Pain Today's Vitals   07/16/22 0715 07/16/22 0800 07/16/22 0830 07/16/22 0930  BP: (!) 146/72 136/78 125/82 139/69  Pulse: (!) 112   (!) 110  Resp: 13 14 13 13   Temp: 97.6 F (36.4 C) (!) 97.4 F (36.3 C) (!) 97.5 F (36.4 C) 97.6 F (36.4 C)  TempSrc:      SpO2: 98%   98%  Weight:      Height:      PainSc:        Isolation Precautions Enteric precautions (UV disinfection)  Medications Medications  lactated ringers infusion ( Intravenous New Bag/Given 07/16/22 0455)  metroNIDAZOLE (FLAGYL) IVPB 500 mg (has no administration in time range)   vancomycin (VANCOCIN) IVPB 1000 mg/200 mL premix (has no administration in time range)  aztreonam (AZACTAM) 1 g in sodium chloride 0.9 % 100 mL IVPB (has no administration in time range)  Chlorhexidine Gluconate Cloth 2 % PADS 6 each (6 each Topical Not Given 07/16/22 0902)  Oral care mouth rinse (has no administration in time range)  lactated ringers bolus 1,000 mL (0 mLs Intravenous Stopped 07/16/22 0030)  metroNIDAZOLE (FLAGYL) IVPB 500 mg (0 mg Intravenous Stopped 07/16/22 0107)  vancomycin (VANCOREADY) IVPB 2000 mg/400 mL (0 mg Intravenous Stopped 07/16/22 0314)  meropenem (MERREM) 1 g in sodium chloride 0.9 % 100 mL IVPB (0 g Intravenous Stopped 07/16/22 0107)  lactated ringers bolus 1,800 mL (0 mLs Intravenous Stopped 07/16/22 0253)    Mobility Walks High fall risk   Focused Assessments Cardiac Assessment Handoff:    Lab Results  Component Value Date   CKTOTAL 2,971 (H) 07/16/2022   CKMB  0.6 01/12/2010   TROPONINI <0.03 02/17/2018   Lab Results  Component Value Date   DDIMER 0.44 02/16/2018   Does the Patient currently have chest pain? No    R Recommendations: See Admitting Provider Note  Report given to:   Additional Notes:

## 2022-07-16 NOTE — Progress Notes (Signed)
Pharmacy Antibiotic Note  Stephanie Benton is a 71 y.o. female admitted on 07/15/2022 with sepsis.  In the ED patient received Flagyl 500mg  IV, Vancomycin 2gm IV and meropenem 1gm IV x 1 dose each.  Upon admission Pharmacy has been consulted for Vancomycin and Aztreonam dosing.  MD dosing metronidazole.  Patient with noted PCN allergy (reaction = hives).  Patient did answer the question "Did it involve swelling of the face/tongue/throat, SOB, or low BP? "  Yes    No use of cephalosporins noted in EPIC history.   Plan: Aztreonam 1gm IV q8h Metronidazole per MD Vancomycin 1000 mg IV Q 48 hrs. Goal AUC 400-550.  Expected AUC: 452.2  SCr used: 1.97 Follow renal function F/u culture results and sensitivities  Height: 5\' 5"  (165.1 cm) Weight: 93.4 kg (206 lb) IBW/kg (Calculated) : 57  Temp (24hrs), Avg:94.3 F (34.6 C), Min:92.2 F (33.4 C), Max:96.6 F (35.9 C)  Recent Labs  Lab 07/09/22 0513 07/10/22 0554 07/11/22 0553 07/15/22 2243 07/16/22 0134  WBC 6.1  --  6.6 12.9*  --   CREATININE 0.75 0.55  --  1.97*  --   LATICACIDVEN  --   --   --  7.6* 4.3*    Estimated Creatinine Clearance: 29.2 mL/min (A) (by C-G formula based on SCr of 1.97 mg/dL (H)).    Allergies  Allergen Reactions   Fish-Derived Products Anaphylaxis   Morphine And Related Anaphylaxis and Swelling   Belsomra [Suvorexant] Other (See Comments)    Caused nightmares   Codeine Nausea And Vomiting   Fentanyl Hives   Ibuprofen Nausea Only and Other (See Comments)    Severe nausea- history of stomach ulcers   Nsaids Other (See Comments)    History of stomach ulcers   Other Other (See Comments)    History of stomach ulcers   Penicillins Hives    Did it involve swelling of the face/tongue/throat, SOB, or low BP? Yes Did it involve sudden or severe rash/hives, skin peeling, or any reaction on the inside of your mouth or nose? Yes Did you need to seek medical attention at a hospital or doctor's office? Yes When  did it last happen? More than 10 years ago If all above answers are "NO", may proceed with cephalosporin use.    Sulfa Antibiotics Swelling    Site of swelling not recalled   Tolmetin Other (See Comments)    History of stomach ulcers    Antimicrobials this admission: 12/12 meropenem x 1 12/12 vancomycin >>   12/12 metronidazole >> 12/12 aztreonam  Dose adjustments this admission:    Microbiology results: 12/11 BCx:   12/12 UCx:       Thank you for allowing pharmacy to be a part of this patient's care.  14/12/23, PharmD 07/16/2022 4:53 AM

## 2022-07-17 DIAGNOSIS — G9341 Metabolic encephalopathy: Secondary | ICD-10-CM | POA: Diagnosis not present

## 2022-07-17 DIAGNOSIS — E440 Moderate protein-calorie malnutrition: Secondary | ICD-10-CM | POA: Insufficient documentation

## 2022-07-17 LAB — COMPREHENSIVE METABOLIC PANEL
ALT: 57 U/L — ABNORMAL HIGH (ref 0–44)
AST: 165 U/L — ABNORMAL HIGH (ref 15–41)
Albumin: 2.1 g/dL — ABNORMAL LOW (ref 3.5–5.0)
Alkaline Phosphatase: 88 U/L (ref 38–126)
Anion gap: 10 (ref 5–15)
BUN: 23 mg/dL (ref 8–23)
CO2: 21 mmol/L — ABNORMAL LOW (ref 22–32)
Calcium: 8.7 mg/dL — ABNORMAL LOW (ref 8.9–10.3)
Chloride: 111 mmol/L (ref 98–111)
Creatinine, Ser: 1.64 mg/dL — ABNORMAL HIGH (ref 0.44–1.00)
GFR, Estimated: 33 mL/min — ABNORMAL LOW (ref >60)
Glucose, Bld: 75 mg/dL (ref 70–99)
Potassium: 3.8 mmol/L (ref 3.5–5.1)
Sodium: 142 mmol/L (ref 135–145)
Total Bilirubin: 1.2 mg/dL (ref 0.3–1.2)
Total Protein: 4.6 g/dL — ABNORMAL LOW (ref 6.5–8.1)

## 2022-07-17 LAB — CBC WITH DIFFERENTIAL/PLATELET
Abs Immature Granulocytes: 0.26 10*3/uL — ABNORMAL HIGH (ref 0.00–0.07)
Basophils Absolute: 0 10*3/uL (ref 0.0–0.1)
Basophils Relative: 0 %
Eosinophils Absolute: 0 10*3/uL (ref 0.0–0.5)
Eosinophils Relative: 0 %
HCT: 23.3 % — ABNORMAL LOW (ref 36.0–46.0)
Hemoglobin: 7.4 g/dL — ABNORMAL LOW (ref 12.0–15.0)
Immature Granulocytes: 2 %
Lymphocytes Relative: 9 %
Lymphs Abs: 1.3 10*3/uL (ref 0.7–4.0)
MCH: 26.2 pg (ref 26.0–34.0)
MCHC: 31.8 g/dL (ref 30.0–36.0)
MCV: 82.6 fL (ref 80.0–100.0)
Monocytes Absolute: 0.9 10*3/uL (ref 0.1–1.0)
Monocytes Relative: 6 %
Neutro Abs: 12.6 10*3/uL — ABNORMAL HIGH (ref 1.7–7.7)
Neutrophils Relative %: 83 %
Platelets: 319 10*3/uL (ref 150–400)
RBC: 2.82 MIL/uL — ABNORMAL LOW (ref 3.87–5.11)
RDW: 17.2 % — ABNORMAL HIGH (ref 11.5–15.5)
WBC: 15.1 10*3/uL — ABNORMAL HIGH (ref 4.0–10.5)
nRBC: 1.7 % — ABNORMAL HIGH (ref 0.0–0.2)

## 2022-07-17 LAB — CK: Total CK: 2617 U/L — ABNORMAL HIGH (ref 38–234)

## 2022-07-17 LAB — AMMONIA: Ammonia: 17 umol/L (ref 9–35)

## 2022-07-17 LAB — HEPATITIS PANEL, ACUTE
HCV Ab: NONREACTIVE
Hep A IgM: NONREACTIVE
Hep B C IgM: NONREACTIVE
Hepatitis B Surface Ag: NONREACTIVE

## 2022-07-17 LAB — VITAMIN B1: Vitamin B1 (Thiamine): 125 nmol/L (ref 66.5–200.0)

## 2022-07-17 LAB — LACTIC ACID, PLASMA: Lactic Acid, Venous: 1.5 mmol/L (ref 0.5–1.9)

## 2022-07-17 MED ORDER — VANCOMYCIN HCL 1250 MG/250ML IV SOLN
1250.0000 mg | INTRAVENOUS | Status: DC
  Administered 2022-07-18: 1250 mg via INTRAVENOUS
  Filled 2022-07-17: qty 250

## 2022-07-17 MED ORDER — ADULT MULTIVITAMIN W/MINERALS CH
1.0000 | ORAL_TABLET | Freq: Every day | ORAL | Status: DC
  Administered 2022-07-17 – 2022-07-22 (×4): 1 via ORAL
  Filled 2022-07-17 (×5): qty 1

## 2022-07-17 MED ORDER — VITAMIN D (ERGOCALCIFEROL) 1.25 MG (50000 UNIT) PO CAPS
50000.0000 [IU] | ORAL_CAPSULE | ORAL | Status: DC
  Administered 2022-07-17: 50000 [IU] via ORAL
  Filled 2022-07-17: qty 1

## 2022-07-17 MED ORDER — SODIUM CHLORIDE 0.9 % IV SOLN: INTRAVENOUS | Status: DC

## 2022-07-17 MED ORDER — BOOST / RESOURCE BREEZE PO LIQD CUSTOM: 1.0000 | Freq: Three times a day (TID) | ORAL | Status: DC

## 2022-07-17 MED ORDER — VITAMIN C 500 MG PO TABS
500.0000 mg | ORAL_TABLET | Freq: Every day | ORAL | Status: DC
  Administered 2022-07-17 – 2022-07-22 (×4): 500 mg via ORAL
  Filled 2022-07-17 (×5): qty 1

## 2022-07-17 NOTE — Progress Notes (Signed)
PROGRESS NOTE  Stephanie Benton  DOB: 03/02/1951  PCP: [name], MD POE:[id number]  DOA: 07/15/2022  LOS: 1 day  Hospital Day: 3  Brief narrative: Stephanie Benton is a 71 y.o. female with PMH significant for HTN, HLD, chronic abdominal pain, anxiety, bipolar disorder who had cholecystectomy 2 months ago.  Recently admitted 12/3-12/9 for acute on chronic abdominal pain felt to be due to acute on chronic pancreatitis.  She was also noted to be anemic and had an EGD done which showed esophagitis. 12/11, patient was brought to the ED from home by EMS for altered mental status.  Per history, she slid off her bed 12/10 and remained on the floor for several hours when her family found her and called EMS. On arrival to the ED, patient was very cool to touch and rectal temperature 92.2 F.  She was placed on [facility].  Labs showed WBC count 12.9, hemoglobin 7.9 (was 8.0 on labs done 4 days ago), bicarb 15, glucose 131, creatinine 1.9 (baseline 0.5-0.8),  AST 84, T. bili 1.6, ALT and alk phos normal, lipase 132,  CK 1905, lactic acid 7.6> 4.3. Blood cultures pending COVID and influenza PCR negative UA is not suggestive of infection INR 1.5, ammonia 37,  Chest x-ray did not suggest pneumonia.   CT abdomen pelvis showed fluid-filled colon with scattered air-fluid levels which could reflect a diarrheal illness.   CT head/C-spine and x-rays of right knee and bilateral hips/pelvis were negative for acute finding.  Patient was given vancomycin, meropenem, metronidazole, and 2 L LR boluses.   Admitted to [facility]  Subjective: Patient was seen and examined this morning.   Propped up in bed.  Alert, awake, no family at bedside.   Patient is oriented to place only.  Not in any pain.  Has a Foley catheter with clear urine.  Assessment and plan: Hypothermia Sepsis versus SIRS Patient was on the floor for several hours without access to water which probably is the cause of hypothermia, tachycardia,  leukocytosis, lactic acidosis.  No history of any diarrhea, fever, urinary symptoms, cough, shortness of breath at home. Initial workup did not show any evidence of infection as supported by negative COVID and influenza PCR, unremarkable UA, unremarkable chest x-ray Patient was placed on Bair hugger with subsequent improvement in temperature Given broad-spectrum IV antibiotics in the ED Continue IV hydration. Lactic acid level gradually improved.  But WBC trending up.  Currently etiology.  Continue IV antibiotics for now. Continue to monitor clinically. Recent Labs  Lab 07/11/22 0553 07/15/22 2243 07/16/22 0134 07/16/22 0515 07/17/22 0305  WBC 6.6 12.9*  --  14.5* 15.1*  LATICACIDVEN  --  7.6* 4.3*  --  1.5   ??  Diarrhea CT abdomen pelvis showing fluid-filled colon with scattered air-fluid levels which could reflect a diarrheal illness.  Patient is unable to recall all the events prior to presentation.  Per nursing staff, she has not had any diarrhea since he was hospitalized more than 24 hours ago.  Continue to monitor. C. difficile PCR and GI pathogen panel, presumptive enteric precautions   Rhabdomyolysis Patient was found down on the floor for greater than 24 hours.   Continue IV fluid hydration CK trend as below.  Gradually improving. Recent Labs  Lab 07/15/22 2230 07/16/22 0515 07/17/22 0305  CKTOTAL 1,905* 2,971* 2,617*    AKI Baseline creatinine less than 1.  Presented with creatinine elevated 1.92, serum bicarb low at 15..  Likely prerenal in the setting of  severe sepsis, dehydration and rhabdomyolysis.  CT without evidence of obstructive uropathy. Renal function improving gradually with IV hydration.  Continue to monitor with IV fluid. Recent Labs    06/23/22 0327 06/24/22 0422 06/25/22 0347 06/30/22 1353 07/07/22 2232 07/09/22 0513 07/10/22 0554 07/15/22 2243 07/16/22 0515 07/17/22 0305  BUN 6* <5* <5* 5* 6* 8 7* _0 CREATININE 0.83 0.77 0.81 0.95  0.80 0.75 0.55 1.97* 1.92* 1.64*  CO2 _1 20* 19* 21* 17* 15* 21* 21*   Elevated lipase, AST, ammonia No history of liver disease.  Elevated AST and ALT likely because of rhabdomyolysis. CT abdomen pelvis without any hepatobiliary or pancreatic etiology. Continue to monitor.  Pending acute hepatitis panel Recent Labs  Lab 07/11/22 0553 07/12/22 0556 07/13/22 0609 07/15/22 2243 07/16/22 0134 07/16/22 0515 07/17/22 0305  AST 11* 10* 9* 84*  --  149* 165*  ALT _2 33  --  44 57*  ALKPHOS 69 79 65 80  --  78 88  BILITOT 1.4* 1.3* 1.3* 1.6*  --  1.6* 1.2  BILIDIR 0.4* 0.6* 0.6*  --   --   --   --   IBILI 1.0* 0.7 0.7  --   --   --   --   PROT 5.1* 5.3* 4.8* 5.2*  --  5.1* 4.6*  ALBUMIN 2.1* 2.4* 2.3* 2.3*  --  2.3* 2.1*  AMMONIA  --   --   --   --  37*  --  17  INR  --   --   --   --  1.5*  --   --   LIPASE 140* 105* 100* 132*  --   --   --   PLT 265  --   --  309  --  293 319      Latest Ref Rng & Units 07/17/2022    3:05 AM  Hepatitis  Hep B Surface Ag NON REACTIVE NON REACTIVE   Hep B IgM NON REACTIVE NON REACTIVE   Hep C Ab NON REACTIVE NON REACTIVE   Hep A IgM NON REACTIVE NON REACTIVE    Acute metabolic encephalopathy Likely secondary to severe sepsis/infection/hypothermia and dehydration.   CT head unremarkable.  Ammonia level not elevated.  No evidence of meningeal signs. In the last 24, patient has remained oriented to place only.  Continue to monitor mental status change  Chronic normocytic anemia Hemoglobin has been running less than 10 since she was hospitalized in October.  Presented with hemoglobin of 7.9.  No evidence of active bleeding.  Anemia panel as below.  Hemoglobin this morning low at 7.4.  Recheck tomorrow.  Transfuse if less than 7. Continue PPI, iron supplements, folic acid Recent Labs    07/09/22 1606 07/11/22 0553 07/12/22 0556 07/15/22 2243 07/16/22 0515 07/17/22 0305  HGB  --  7.2* 8.0* 7.9* 7.9* 7.4*  MCV  --  82.6  --  80.4  80.1 82.6  VITAMINB12 1,104*  --   --   --   --   --   FOLATE 5.9*  --   --   --   --   --   FERRITIN 269  --   --   --   --   --   TIBC 160*  --   --   --   --   --   IRON 80  --   --   --   --   --  Hypertension PTA seems to on multiple medication but pharmacy has not finalized her home meds yet.  Her blood pressure remains controlled at this time.  Meds on hold.   Hyperlipidemia Statin on hold due to elevated transaminases  Anxiety/depression Continue duloxetine 40 mg daily   Goals of care   Code Status: Full Code    Mobility: Needs PT eval  Infusions:   sodium chloride 75 mL/hr at 07/17/22 1420   aztreonam Stopped (07/17/22 1337)   metronidazole Stopped (07/17/22 1246)   [START ON 07/18/2022] vancomycin      Scheduled Meds:  vitamin C  500 mg Oral Daily   Chlorhexidine Gluconate Cloth  6 each Topical Daily   feeding supplement  1 Container Oral TID BM   multivitamin with minerals  1 tablet Oral Daily   Vitamin D (Ergocalciferol)  50,000 Units Oral Q7 days    PRN meds: HYDROmorphone (DILAUDID) injection, mouth rinse   Skin assessment:     Nutritional status:  Body mass index is 30.05 kg/m.  Nutrition Problem: Moderate Malnutrition Etiology: chronic illness Signs/Symptoms: moderate muscle depletion, moderate fat depletion     Diet:  Diet Order             DIET DYS 3 Room service appropriate? Yes; Fluid consistency: Thin  Diet effective now                   DVT prophylaxis:  Place and maintain sequential compression device Start: 07/16/22 0507   Antimicrobials: Broad-spectrum antibiotics Fluid: Will switch IV hydration to normal saline at 75 mill per hour Consultants: None Family Communication: None at bedside  Status is: Inpatient  Continue in-hospital care because: Needs IV fluid, further monitoring for mental status Level of care: Progressive   Dispo: The patient is from: Home              Anticipated d/c is to: Pending clinical  course              Patient currently is not medically stable to d/c.   Difficult to place patient No    Antimicrobials: Anti-infectives (From admission, onward)    Start     Dose/Rate Route Frequency Ordered Stop   07/18/22 0100  vancomycin (VANCOCIN) IVPB 1000 mg/200 mL premix  Status:  Discontinued        1,000 mg 200 mL/hr over 60 Minutes Intravenous Every 48 hours 07/16/22 0506 07/17/22 1055   07/18/22 0100  vancomycin (VANCOREADY) IVPB 1250 mg/250 mL        1,250 mg 166.7 mL/hr over 90 Minutes Intravenous Every 48 hours 07/17/22 1055     07/16/22 1200  aztreonam (AZACTAM) 1 g in sodium chloride 0.9 % 100 mL IVPB        1 g 200 mL/hr over 30 Minutes Intravenous Every 8 hours 07/16/22 0506     07/16/22 1100  metroNIDAZOLE (FLAGYL) IVPB 500 mg        500 mg 100 mL/hr over 60 Minutes Intravenous Every 12 hours 07/16/22 0447     07/16/22 0500  aztreonam (AZACTAM) 2 g in sodium chloride 0.9 % 100 mL IVPB  Status:  Discontinued        2 g 200 mL/hr over 30 Minutes Intravenous  Once 07/16/22 0446 07/16/22 0451   07/16/22 0500  metroNIDAZOLE (FLAGYL) IVPB 500 mg  Status:  Discontinued        500 mg 100 mL/hr over 60 Minutes Intravenous Every 12 hours 07/16/22 0446 07/16/22 0447  07/16/22 0500  vancomycin (VANCOCIN) IVPB 1000 mg/200 mL premix  Status:  Discontinued        1,000 mg 200 mL/hr over 60 Minutes Intravenous  Once 07/16/22 0446 07/16/22 0447   07/15/22 2330  meropenem (MERREM) 1 g in sodium chloride 0.9 % 100 mL IVPB        1 g 200 mL/hr over 30 Minutes Intravenous  Once 07/15/22 2318 07/16/22 0107   07/15/22 2315  metroNIDAZOLE (FLAGYL) IVPB 500 mg        500 mg 100 mL/hr over 60 Minutes Intravenous  Once 07/15/22 2302 07/16/22 0107   07/15/22 2315  vancomycin (VANCOCIN) IVPB 1000 mg/200 mL premix  Status:  Discontinued        1,000 mg 200 mL/hr over 60 Minutes Intravenous  Once 07/15/22 2302 07/15/22 2305   07/15/22 2315  vancomycin (VANCOREADY) IVPB 2000 mg/400 mL         2,000 mg 200 mL/hr over 120 Minutes Intravenous  Once 07/15/22 2306 07/16/22 0314       Objective: Vitals:   07/17/22 0700 07/17/22 0800  BP: (!) 117/54 131/66  Pulse: (!) 107 (!) 111  Resp: 14 (!) 22  Temp: 99.7 F (37.6 C) 99.5 F (37.5 C)  SpO2: 98% 95%    Intake/Output Summary (Last 24 hours) at 07/17/2022 1717 Last data filed at 07/17/2022 1420 Gross per 24 hour  Intake 1209.37 ml  Output 1425 ml  Net -215.63 ml   Filed Weights   07/15/22 2123 07/16/22 1049  Weight: 93.4 kg 81.9 kg   Weight change: -11.5 kg Body mass index is 30.05 kg/m.   Physical Exam: General exam: Pleasant, elderly African-American female.  Not in pain Skin: No rashes, lesions or ulcers. HEENT: Atraumatic, normocephalic, no obvious bleeding Lungs: Clear to auscultation bilaterally CVS: Regular rate and rhythm, no murmur GI/Abd soft, nontender, nondistended, bowel sound present CNS: Alert, awake, oriented to place only.  Not restless or agitated Psychiatry: Sad affect Extremities: No pedal edema, no calf tenderness  Data Review: I have personally reviewed the laboratory data and studies available.  F/u labs ordered Unresulted Labs (From admission, onward)     Start     Ordered   07/18/22 0500  CK  Tomorrow morning,   R       Question:  Specimen collection method  Answer:  Lab=Lab collect   07/17/22 0747   07/17/22 0500  CBC with Differential/Platelet  Daily,   R      07/16/22 1427   07/17/22 0500  Comprehensive metabolic panel  Daily,   R      07/16/22 1429   07/16/22 0449  C Difficile Quick Screen w PCR reflex  (C Difficile quick screen w PCR reflex panel )  Once, for 24 hours,   TIMED       References:    CDiff Information Tool   07/16/22 0448   07/16/22 0449  Gastrointestinal Panel by PCR , Stool  (Gastrointestinal Panel by PCR, Stool                                                                                                                                                      **  Does Not include CLOSTRIDIUM DIFFICILE testing. **If CDIFF testing is needed, place order from the "C Difficile Testing" order set.**)  Once,   R        07/16/22 0448            Signed, Terrilee Croak, MD [facility] 07/17/2022

## 2022-07-17 NOTE — Progress Notes (Signed)
Initial Nutrition Assessment  DOCUMENTATION CODES:   Non-severe (moderate) malnutrition in context of chronic illness  INTERVENTION:  - DYS 3 diet as medically appropriate.  - Boost Breeze po TID, each supplement provides 250 kcal and 9 grams of protein - Automatic house trays to ensure patient receives 3 meals a day while confused.  - Several vitamins noted to be low: Vitamin A, Vitamin D, Vitamin E, Vitamin C.   - MD aware, ordering supplementation to aid in correcting deficiency.  - Monitor weight trends.  NUTRITION DIAGNOSIS:   Moderate Malnutrition related to chronic illness as evidenced by moderate muscle depletion, moderate fat depletion.  GOAL:   Patient will meet greater than or equal to 90% of their needs  MONITOR:   PO intake, Supplement acceptance, Weight trends, Labs  REASON FOR ASSESSMENT:   Malnutrition Screening Tool    ASSESSMENT:   71 y.o. female with PMH significant for HTN, HLD, chronic abdominal pain, anxiety, bipolar disorder who had cholecystectomy 2 months ago.    Recently admitted 12/3-12/9 for acute on chronic abdominal pain felt to be due to acute on chronic pancreatitis.   Presented this admission on 12/11, when patient was brought to the ED by EMS for altered mental status.   Visited with patient at bedside this AM to obtain nutrition history. However, patient very confused and did not respond to any questions. Noted to be admitted with AMS.   Per chart review, no significant changes in weight over the past month. However, patient is experiencing generalized moderate edema which may be affecting current weight status and masking weight loss.   RD has assessed this patient during previous admission. At that time she had noted decreased appetite for several year. Also reported she has had Ensure and Boost at home but not drinking regularly.   Will order Boost Breeze TID to support intake and change patient to automatic house trays to ensure she  receives 3 meals a day while confused.  Patient also assessed by RD on 12/7 and vitamin/mineral profile ordered. Patient noted to be deficient in Vitamin A, vitamin D, vitamin E, and vitamin C which will need to be repleted. Vitamin K and Zinc still pending at this time.   Medications reviewed and include: -  Labs reviewed:  Creatinine 1.64  Latest Reference Range & Units 07/09/22 16:06 07/11/22 16:56  Vitamin B1 (Thiamine) 66.5 - 200.0 nmol/L  125.0  Vitamin D, 25-Hydroxy 30 - 100 ng/mL  28.54 (L)  Vitamin A (Retinoic Acid) 22.0 - 69.5 ug/dL  82.9 (L)  Vitamin F62 180 - 914 pg/mL 1,104 (H)   Vitamin C 0.4 - 2.0 mg/dL  <1.3 (L)  Vitamin E (Alpha Tocopherol) 9.0 - 29.0 mg/L  8.6 (L)  Vitamin E(Gamma Tocopherol) 0.5 - 4.9 mg/L  1.2 (C)  (L): Data is abnormally low (H): Data is abnormally high (C): Corrected   NUTRITION - FOCUSED PHYSICAL EXAM:  Flowsheet Row Most Recent Value  Orbital Region Moderate depletion  Upper Arm Region Moderate depletion  Thoracic and Lumbar Region Moderate depletion  Buccal Region Moderate depletion  Temple Region Severe depletion  Clavicle Bone Region Moderate depletion  Clavicle and Acromion Bone Region Moderate depletion  Scapular Bone Region Unable to assess  Dorsal Hand Moderate depletion  Patellar Region Mild depletion  Anterior Thigh Region Mild depletion  Posterior Calf Region Mild depletion  Edema (RD Assessment) Moderate  Hair Reviewed  Eyes Reviewed  Mouth Reviewed  Skin Reviewed  Nails Reviewed  Diet Order:   Diet Order             DIET DYS 3 Room service appropriate? Yes; Fluid consistency: Thin  Diet effective now                   EDUCATION NEEDS:  Not appropriate for education at this time  Skin:  Skin Assessment: Reviewed RN Assessment  Last BM:  12/12  Height:  Ht Readings from Last 1 Encounters:  07/16/22 5\' 5"  (1.651 m)   Weight:  Wt Readings from Last 1 Encounters:  07/16/22 81.9 kg   Ideal  Body Weight:  56.8 kg  BMI:  Body mass index is 30.05 kg/m.  Estimated Nutritional Needs:  Kcal:  2200-2450 kcals Protein:  100-125 grams Fluid:  >/= 2.2L    14/12/23 RD, LDN For contact information, refer to [facility].

## 2022-07-17 NOTE — Evaluation (Signed)
Physical Therapy Evaluation Patient Details Name: Stephanie Benton MRN: [id number] DOB: April 11, 1951 Today's Date: 07/17/2022  History of Present Illness  Stephanie Benton is a 71 y.o. female with PMH significant for HTN, HLD, chronic abdominal pain, anxiety, bipolar disorder who had cholecystectomy 2 months ago.  Recently admitted 12/3-12/9 for acute on chronic abdominal pain felt to be due to acute on chronic pancreatitis. Found to be  anemic, EGD  showed esophagitis.  12/11, patient was brought to the ED from home by EMS for altered mental status, Per history, she slid off her bed 12/10 and remained on the floor for several hours when her family found her and called EMS.  Clinical Impression  Pt admitted with above diagnosis.  Pt currently with functional limitations due to the deficits listed below (see PT Problem List). Pt will benefit from skilled PT to increase their independence and safety with mobility to allow discharge to the venue listed below.   The patient is awake, speech is not clear, mumbling same garbled words over and over.. Patient unable to answer orientation questions.   Evaluation  limited by patient's indication of pain with movement of righ and left legs and more Lsshlouder than right., unable to attempt sitting edge of bed, only placed bed in a chair position.    Recommendations for follow up therapy are one component of a multi-disciplinary discharge planning process, led by the attending physician.  Recommendations may be updated based on patient status, additional functional criteria and insurance authorization.  Follow Up Recommendations Skilled nursing-short term rehab (<3 hours/day) Can patient physically be transported by private vehicle: No    Assistance Recommended at Discharge Frequent or constant Supervision/Assistance  Patient can return home with the following  Two people to help with walking and/or transfers;Two people to help with  bathing/dressing/bathroom;Assist for transportation    Equipment Recommendations None recommended by PT  Recommendations for Other Services       Functional Status Assessment Patient has had a recent decline in their functional status and demonstrates the ability to make significant improvements in function in a reasonable and predictable amount of time.     Precautions / Restrictions Precautions Precautions: Fall Precaution Comments: pain in right foot and left knee, shoulders      Mobility  Bed Mobility               General bed mobility comments: unable to progress mobility beyond the bed/ chair position for ~ 5 mins    Transfers                        Ambulation/Gait                  Stairs            Wheelchair Mobility    Modified Rankin (Stroke Patients Only)       Balance               Standing balance comment: not tested                             Pertinent Vitals/Pain Pain Assessment Breathing: normal Negative Vocalization: repeated troubled calling out, loud moaning/groaning, crying Facial Expression: facial grimacing Body Language: tense, distressed pacing, fidgeting Consolability: distracted or reassured by voice/touch PAINAD Score: 6 Facial Expression: Grimacing Body Movements: Protection Muscle Tension: Tense, rigid Compliance with ventilator (intubated pts.): N/A Vocalization (extubated pts.): Talking  in normal tone or no sound CPOT Total: 4 Pain Location: right foot, left knee  left shoulder Pain Descriptors / Indicators: Discomfort, Grimacing, Guarding, Moaning Pain Intervention(s): Limited activity within patient's tolerance, Monitored during session    Home Living Family/patient expects to be discharged to:: Private residence Living Arrangements: Alone Available Help at Discharge: Family;Available PRN/intermittently Type of Home: Apartment Home Access: Level entry       Home Layout:  One level Home Equipment: Pharmacist, hospital (2 wheels);BSC/3in1      Prior Function Prior Level of Function : appears was alone since DC ~ 1 week ago             Mobility Comments: use of hurrycane for mobility inside & RW when outside- info from previous encounter ADLs Comments: ambulated  with assistance 6 days ago-12' x 2 with RW     Hand Dominance   Dominant Hand: Right    Extremity/Trunk Assessment   Upper Extremity Assessment Upper Extremity Assessment:  (limited  shouldr elevation, indicates L shoulder pain)    Lower Extremity Assessment Lower Extremity Assessment: RLE deficits/detail;LLE deficits/detail RLE Deficits / Details: yells out when right foot is touched, moans with attempts to flex hip and knee, patient guarding LLE Deficits / Details: yells and moans with attempt to flex hip and knee, even when placing bed in chair position, indicates increased knee pain with attempts    Cervical / Trunk Assessment Cervical / Trunk Assessment: Normal  Communication   Communication: Expressive difficulties (repeating something not sensible)  Cognition Arousal/Alertness: Awake/alert Behavior During Therapy: Flat affect Overall Cognitive Status: Impaired/Different from baseline Area of Impairment: Orientation, Attention, Following commands, Awareness                 Orientation Level: Place, Time, Situation Current Attention Level: Focused   Following Commands: Follows one step commands consistently   Awareness: Emergent   General Comments: patient with impaired speech and unable to  clearly state where her pain is, just noted by responses, withdrawal        General Comments      Exercises     Assessment/Plan    PT Assessment Patient needs continued PT services  PT Problem List Decreased strength;Decreased activity tolerance;Decreased balance;Decreased mobility;Pain;Decreased knowledge of use of DME       PT Treatment Interventions DME  instruction;Therapeutic exercise;Balance training;Functional mobility training;Therapeutic activities;Patient/family education    PT Goals (Current goals can be found in the Care Plan section)  Acute Rehab PT Goals PT Goal Formulation: Patient unable to participate in goal setting Time For Goal Achievement: 07/31/22 Potential to Achieve Goals: Fair    Frequency Min 2X/week     Co-evaluation               AM-PAC PT "6 Clicks" Mobility  Outcome Measure Help needed turning from your back to your side while in a flat bed without using bedrails?: Total Help needed moving from lying on your back to sitting on the side of a flat bed without using bedrails?: Total Help needed moving to and from a bed to a chair (including a wheelchair)?: Total Help needed standing up from a chair using your arms (e.g., wheelchair or bedside chair)?: Total Help needed to walk in hospital room?: Total Help needed climbing 3-5 steps with a railing? : Total 6 Click Score: 6    End of Session   Activity Tolerance: Patient limited by pain Patient left: in bed;with call bell/phone within reach;with bed alarm set Nurse  Communication: Mobility status;Need for lift equipment PT Visit Diagnosis: History of falling (Z91.81);Muscle weakness (generalized) (M62.81);Difficulty in walking, not elsewhere classified (R26.2)    Time: 6754-4920 PT Time Calculation (min) (ACUTE ONLY): 30 min   Charges:   PT Evaluation $PT Eval Low Complexity: 1 Low PT Treatments $Therapeutic Activity: 8-22 mins        Blanchard Kelch PT Acute Rehabilitation Services Office [phone] Weekend pager-[phone]   Rada Hay 07/17/2022, 12:20 PM

## 2022-07-17 NOTE — Progress Notes (Signed)
Pharmacy Antibiotic Note  Stephanie Benton is a 71 y.o. female admitted on 07/15/2022 with  Sepsis vs SIRS, also with diarrheal infection? .  Pharmacy has been consulted for Vanco dosing.  ID: Sepsis vs SIRS, also with diarrheal infection? - hypothermia - was on bear hugger (Tmin 92.2), improved to 99.5 - CT abdomen pelvis showed fluid-filled colon with scattered air-fluid levels which could reflect a diarrheal illness.    - WBC 15.1 up, LA down to 1.5, awaiting cultures  Antimicrobials this admission: 12/12 meropenem x 1 12/12 vancomycin >>   12/12 metronidazole >> 12/12 aztreonam >>    Microbiology results: 12/11 BCx: NGTD 12/12 UCx:>> 12/12 GI panel: 12/12 Cdiff:  12/12 MRSA PCR: neg COVID and influenza PCR negative   12/12: - Vancomycin 1000 mg IV Q 48 hrs - Expected AUC: 452.2 SCr used: 1.97  12/13: Chg Vanco to 1250mg /48h with AUC 553 and Scr 1.64  Plan: - Change Vancomycin 1250 mg IV Q 48 hrs. Goal AUC 400-550. Expected AUC: 553 SCr used: 1.64 Reassess daily as Scr improves   Height: 5\' 5"  (165.1 cm) Weight: 81.9 kg (180 lb 8.9 oz) IBW/kg (Calculated) : 57  Temp (24hrs), Avg:98.4 F (36.9 C), Min:97.5 F (36.4 C), Max:99.7 F (37.6 C)  Recent Labs  Lab 07/11/22 0553 07/15/22 2243 07/16/22 0134 07/16/22 0515 07/17/22 0305  WBC 6.6 12.9*  --  14.5* 15.1*  CREATININE  --  1.97*  --  1.92* 1.64*  LATICACIDVEN  --  7.6* 4.3*  --  1.5    Estimated Creatinine Clearance: 32.8 mL/min (A) (by C-G formula based on SCr of 1.64 mg/dL (H)).    Allergies  Allergen Reactions   Fish-Derived Products Anaphylaxis   Morphine And Related Anaphylaxis and Swelling   Belsomra [Suvorexant] Other (See Comments)    Caused nightmares   Codeine Nausea And Vomiting   Fentanyl Hives   Ibuprofen Nausea Only and Other (See Comments)    Severe nausea- history of stomach ulcers   Nsaids Other (See Comments)    History of stomach ulcers   Other Other (See Comments)    History of  stomach ulcers   Penicillins Hives    Did it involve swelling of the face/tongue/throat, SOB, or low BP? Yes Did it involve sudden or severe rash/hives, skin peeling, or any reaction on the inside of your mouth or nose? Yes Did you need to seek medical attention at a hospital or doctor's office? Yes When did it last happen? More than 10 years ago If all above answers are "NO", may proceed with cephalosporin use.    Sulfa Antibiotics Swelling    Site of swelling not recalled   Tolmetin Other (See Comments)    History of stomach ulcers     Secret Kristensen S. 14/12/23, PharmD, BCPS Clinical Staff Pharmacist [email or web address] 07/19/22 07/17/2022 10:57 AM

## 2022-07-18 DIAGNOSIS — A419 Sepsis, unspecified organism: Secondary | ICD-10-CM

## 2022-07-18 DIAGNOSIS — R6520 Severe sepsis without septic shock: Secondary | ICD-10-CM | POA: Diagnosis not present

## 2022-07-18 LAB — CBC WITH DIFFERENTIAL/PLATELET
Abs Immature Granulocytes: 0.27 10*3/uL — ABNORMAL HIGH (ref 0.00–0.07)
Basophils Absolute: 0.1 10*3/uL (ref 0.0–0.1)
Basophils Relative: 0 %
Eosinophils Absolute: 0.1 10*3/uL (ref 0.0–0.5)
Eosinophils Relative: 1 %
HCT: 23.8 % — ABNORMAL LOW (ref 36.0–46.0)
Hemoglobin: 7.6 g/dL — ABNORMAL LOW (ref 12.0–15.0)
Immature Granulocytes: 2 %
Lymphocytes Relative: 8 %
Lymphs Abs: 1 10*3/uL (ref 0.7–4.0)
MCH: 26.8 pg (ref 26.0–34.0)
MCHC: 31.9 g/dL (ref 30.0–36.0)
MCV: 83.8 fL (ref 80.0–100.0)
Monocytes Absolute: 0.9 10*3/uL (ref 0.1–1.0)
Monocytes Relative: 7 %
Neutro Abs: 11.4 10*3/uL — ABNORMAL HIGH (ref 1.7–7.7)
Neutrophils Relative %: 82 %
Platelets: 273 10*3/uL (ref 150–400)
RBC: 2.84 MIL/uL — ABNORMAL LOW (ref 3.87–5.11)
RDW: 18.3 % — ABNORMAL HIGH (ref 11.5–15.5)
WBC: 13.8 10*3/uL — ABNORMAL HIGH (ref 4.0–10.5)
nRBC: 1.6 % — ABNORMAL HIGH (ref 0.0–0.2)

## 2022-07-18 LAB — COMPREHENSIVE METABOLIC PANEL
ALT: 59 U/L — ABNORMAL HIGH (ref 0–44)
AST: 149 U/L — ABNORMAL HIGH (ref 15–41)
Albumin: 2.3 g/dL — ABNORMAL LOW (ref 3.5–5.0)
Alkaline Phosphatase: 98 U/L (ref 38–126)
Anion gap: 9 (ref 5–15)
BUN: 22 mg/dL (ref 8–23)
CO2: 20 mmol/L — ABNORMAL LOW (ref 22–32)
Calcium: 8.7 mg/dL — ABNORMAL LOW (ref 8.9–10.3)
Chloride: 112 mmol/L — ABNORMAL HIGH (ref 98–111)
Creatinine, Ser: 1.21 mg/dL — ABNORMAL HIGH (ref 0.44–1.00)
GFR, Estimated: 48 mL/min — ABNORMAL LOW (ref >60)
Glucose, Bld: 86 mg/dL (ref 70–99)
Potassium: 3.9 mmol/L (ref 3.5–5.1)
Sodium: 141 mmol/L (ref 135–145)
Total Bilirubin: 1.4 mg/dL — ABNORMAL HIGH (ref 0.3–1.2)
Total Protein: 5.3 g/dL — ABNORMAL LOW (ref 6.5–8.1)

## 2022-07-18 LAB — URINE CULTURE: Culture: 2000 — AB

## 2022-07-18 LAB — VITAMIN K1, SERUM: VITAMIN K1: <0.1 ng/mL — ABNORMAL LOW (ref 0.10–2.20)

## 2022-07-18 LAB — CK: Total CK: 2396 U/L — ABNORMAL HIGH (ref 38–234)

## 2022-07-18 MED ORDER — IRBESARTAN 300 MG PO TABS
300.0000 mg | ORAL_TABLET | Freq: Every day | ORAL | Status: DC
  Administered 2022-07-18 – 2022-07-22 (×4): 300 mg via ORAL
  Filled 2022-07-18 (×5): qty 1

## 2022-07-18 MED ORDER — ORAL CARE MOUTH RINSE: 15.0000 mL | OROMUCOSAL | Status: DC | PRN

## 2022-07-18 NOTE — TOC Progression Note (Addendum)
Transition of Care [facility]) - Progression Note    Patient Details  Name: Stephanie Benton MRN: [id number] Date of Birth: [birth year]  [facility] CM/SW Contact  Golda Acre, RN Phone Number: 07/18/2022, 8:28 AM  Clinical Narrative:    passar number is [phone] A Fl2 sent  out to area snf's for review. Family (son) has chosen [facility] for snf placement.        Expected Discharge Plan and Services                                                 Social Determinants of Health (SDOH) Interventions    Readmission Risk Interventions   Row Labels 07/10/2022   10:49 AM 06/24/2022    9:42 AM 05/21/2022   10:04 AM  Readmission Risk Prevention Plan   Section Header. No data exists in this row.     Transportation Screening   Complete Complete Complete  PCP or Specialist Appt within 3-5 Days    Complete   Home Care Screening     Complete  Medication Review (RN CM)     Complete  HRI or Home Care Consult    Complete   Social Work Consult for Recovery Care Planning/Counseling    Complete   Palliative Care Screening    Not Applicable   Medication Review [employer])   Complete Complete   PCP or Specialist appointment within 3-5 days of discharge   Complete    HRI or Home Care Consult   Complete    SW Recovery Care/Counseling Consult   Complete    Palliative Care Screening   Not Applicable

## 2022-07-18 NOTE — [place] FL2 (Signed)
[place] MEDICAID FL2 LEVEL OF CARE FORM     IDENTIFICATION  Patient Name: JAUNICE MIRZA Birthdate: [birth year] Sex: female Admission Date (Current Location): 07/15/2022  [place] [facility] and [place] Number:  Producer, television/film/video and Address:  [facility],  [address] [place], [place] [address]      Provider Number: [id number]  Attending Physician Name and Address:  Lorin Glass, MD  Relative Name and Phone Number:       Current Level of Care: Hospital Recommended Level of Care:   Prior Approval Number:    Date Approved/Denied:   PASRR Number: [id number]  Discharge Plan: SNF    Current Diagnoses: Patient Active Problem List   Diagnosis Date Noted   Malnutrition of moderate degree 07/17/2022   Severe sepsis (HCC) 07/16/2022   Hypothermia 07/16/2022   Rhabdomyolysis 07/16/2022   Acute metabolic encephalopathy 07/16/2022   Elevated liver enzymes 07/16/2022   Acute on chronic pancreatitis (HCC) 06/23/2022   Hypokalemia 06/21/2022   COPD (chronic obstructive pulmonary disease) (HCC) 06/21/2022   Glaucoma 06/21/2022   Abdominal pain, chronic, epigastric    LFTs abnormal    Pancreatitis 05/17/2022   Metabolic acidosis 05/17/2022   Esophagitis determined by endoscopy 05/17/2022   Dehydration 05/16/2022   Sleep disturbance 08/21/2021   Spondylolisthesis at L5-S1 level 04/25/2021   Primary osteoarthritis of left knee 05/12/2018   Insomnia due to mental condition 05/08/2018   Chronic bilateral low back pain 03/25/2018   Tobacco abuse counseling 02/18/2018   Hyperkalemia 02/18/2018   Chest pain 02/17/2018   COPD with acute exacerbation (HCC) 02/17/2018   Hypertension 02/17/2018   Depression with anxiety 02/17/2018   AKI (acute kidney injury) (HCC)    Anemia    Bipolar disorder, in partial remission, most recent episode mixed (HCC) 07/04/2017   Osteopenia of multiple sites 07/04/2017   Bipolar disorder, in full remission, most recent episode mixed (HCC)  07/04/2017   Bipolar disorder, in partial remission, most recent episode depressed (HCC) 07/04/2017   Cigarette smoker 06/12/2017   Bilateral hip pain 05/01/2017   B12 deficiency 03/04/2017   Spondylosis of lumbar region without myelopathy or radiculopathy 08/08/2016   Risk for falls 05/06/2016   Hypercholesterolemia 02/21/2016    Orientation RESPIRATION BLADDER Height & Weight     Self, Place  Normal Continent Weight: 81.9 kg Height:  5\' 5"  (165.1 cm)  BEHAVIORAL SYMPTOMS/MOOD NEUROLOGICAL BOWEL NUTRITION STATUS      Continent Diet (regular)  AMBULATORY STATUS COMMUNICATION OF NEEDS Skin   Extensive Assist Verbally Normal                       Personal Care Assistance Level of Assistance  Bathing, Feeding, Dressing Bathing Assistance: Limited assistance Feeding assistance: Limited assistance Dressing Assistance: Limited assistance     Functional Limitations Info  Sight, Hearing, Speech Sight Info: Adequate Hearing Info: Adequate Speech Info: Adequate    SPECIAL CARE FACTORS FREQUENCY  PT (By licensed PT), OT (By licensed OT)     PT Frequency: 5 x weekly OT Frequency: 5 x weekly            Contractures Contractures Info: Not present    Additional Factors Info  Code Status Code Status Info: full             Current Medications (07/18/2022):  This is the current hospital active medication list Current Facility-Administered Medications  Medication Dose Route Frequency Provider Last Rate Last Admin   0.9 %  sodium chloride  infusion   Intravenous Continuous Dahal, Melina Schools, MD 75 mL/hr at 07/18/22 0500 Infusion Verify at 07/18/22 0500   ascorbic acid (VITAMIN C) tablet 500 mg  500 mg Oral Daily Dahal, [name], MD   500 mg at 07/17/22 1641   aztreonam (AZACTAM) 1 g in sodium chloride 0.9 % 100 mL IVPB  1 g Intravenous Q8H Poindexter, Leann T, RPH   Stopped at 07/18/22 0404   Chlorhexidine Gluconate Cloth 2 % PADS 6 each  6 each Topical Daily John Giovanni, MD   6 each at 07/17/22 1026   feeding supplement (BOOST / RESOURCE BREEZE) liquid 1 Container  1 Container Oral TID BM Dahal, [name], MD       HYDROmorphone (DILAUDID) injection 0.25-0.5 mg  0.25-0.5 mg Intravenous Q4H PRN Luiz Iron, NP   0.5 mg at 07/18/22 0330   irbesartan (AVAPRO) tablet 300 mg  300 mg Oral Daily Dahal, [name], MD       metroNIDAZOLE (FLAGYL) IVPB 500 mg  500 mg Intravenous Q12H John Giovanni, MD   Stopped at 07/18/22 0057   multivitamin with minerals tablet 1 tablet  1 tablet Oral Daily Dahal, Melina Schools, MD   1 tablet at 07/17/22 1641   Oral care mouth rinse  15 mL Mouth Rinse PRN Dahal, Melina Schools, MD       vancomycin (VANCOREADY) IVPB 1250 mg/250 mL  1,250 mg Intravenous Q48H Norva Pavlov, RPH   Stopped at 07/18/22 9381   Vitamin D (Ergocalciferol) (DRISDOL) 1.25 MG (50000 UNIT) capsule 50,000 Units  50,000 Units Oral Q7 days Lorin Glass, MD   50,000 Units at 07/17/22 1641     Discharge Medications: Please see discharge summary for a list of discharge medications.  Relevant Imaging Results:  Relevant Lab Results:   Additional Information SSN:[id number]  alergies to fish and morphine  Golda Acre, RN

## 2022-07-18 NOTE — Progress Notes (Addendum)
PROGRESS NOTE  Stephanie Benton  DOB: 10-12-1950  PCP: [name], MD YCX:[id number]  DOA: 07/15/2022  LOS: 2 days  Hospital Day: 4  Brief narrative: Stephanie Benton is a 71 y.o. female with PMH significant for HTN, HLD, chronic abdominal pain, anxiety, bipolar disorder who had cholecystectomy 2 months ago.  Recently admitted 12/3-12/9 for acute on chronic abdominal pain felt to be due to acute on chronic pancreatitis.  She was also noted to be anemic and had an EGD done which showed esophagitis. 12/11, patient was brought to the ED from home by EMS for altered mental status.  Per history, she slid off her bed 12/10 and remained on the floor for several hours when her family found her and called EMS. On arrival to the ED, patient was very cool to touch and rectal temperature 92.2 F.  She was placed on [facility].  Labs showed WBC count 12.9, hemoglobin 7.9 (was 8.0 on labs done 4 days ago), bicarb 15, glucose 131, creatinine 1.9 (baseline 0.5-0.8),  AST 84, T. bili 1.6, ALT and alk phos normal, lipase 132,  CK 1905, lactic acid 7.6> 4.3. Blood cultures pending COVID and influenza PCR negative UA is not suggestive of infection INR 1.5, ammonia 37,  Chest x-ray did not suggest pneumonia.   CT abdomen pelvis showed fluid-filled colon with scattered air-fluid levels which could reflect a diarrheal illness.   CT head/C-spine and x-rays of right knee and bilateral hips/pelvis were negative for acute finding.  Patient was given vancomycin, meropenem, metronidazole, and 2 L LR boluses.   Admitted to [facility]  Subjective: Patient was seen and examined this morning.  Confused, alert, awake.  Has mittens in her hand.  Remains tachycardic.  Not in pain.  Family not at bedside. Hypothermic overnight HR and BP up. Remains on antibiotics for last 3 days Creatinine improving but CK level is not much improving.  Assessment and plan: Hypothermia Sepsis versus SIRS Prior to admission, patient was  on the floor for several hours without access to water which probably is the cause of hypothermia, tachycardia, leukocytosis, lactic acidosis.  No history of any diarrhea, fever, urinary symptoms, cough, shortness of breath at home. Initial workup did not show any evidence of infection as supported by negative COVID and influenza PCR, unremarkable UA, unremarkable chest x-ray Patient was placed on B[employer] hugger with subsequent improvement in temperature Given broad-spectrum IV antibiotics in the ED which was continued for 3 days. No growth on blood culture.  No source of infection identified. Will stop antibiotics today. Lactic acid improved with hydration Recent Labs  Lab 07/15/22 2243 07/16/22 0134 07/16/22 0515 07/17/22 0305 07/18/22 0318  WBC 12.9*  --  14.5* 15.1* 13.8*  LATICACIDVEN 7.6* 4.3*  --  1.5  --    Sinus tachycardia Heart rate remains elevated in the 100s despite significant volume resuscitation, stable blood pressure, improved lactic acid.  She is not in pain. Last echo from October 2023 with EF 60 to 65%, mild LVH, grade 1 diastolic dysfunction. Continue to monitor in telemetry.  Questionable history of diarrhea CT abdomen pelvis showing fluid-filled colon with scattered air-fluid levels which could reflect a diarrheal illness.  Patient is unable to recall all the events prior to presentation. Per nursing staff, she has not had any diarrhea since she was hospitalized more than 24 hours ago.  Continue to monitor. C. difficile PCR and GI pathogen panel, presumptive enteric precautions   Rhabdomyolysis Patient was found down on the floor  for greater than 24 hours.   Continue IV fluid hydration CK trend as below. Gradually improving. Recent Labs  Lab 07/15/22 2230 07/16/22 0515 07/17/22 0305 07/18/22 0318  CKTOTAL 1,905* 2,971* 2,617* 2,396*    AKI Baseline creatinine less than 1.  Presented with creatinine elevated 1.92, serum bicarb low at 15..  Likely prerenal  in the setting of severe sepsis, dehydration and rhabdomyolysis.  CT without evidence of obstructive uropathy. Renal function improving gradually with IV hydration.  Continue to monitor with IV fluid. Recent Labs    06/24/22 0422 06/25/22 0347 06/30/22 1353 07/07/22 2232 07/09/22 0513 07/10/22 0554 07/15/22 2243 07/16/22 0515 07/17/22 0305 07/18/22 0318  BUN <5* <5* 5* 6* 8 7* _0 CREATININE 0.77 0.81 0.95 0.80 0.75 0.55 1.97* 1.92* 1.64* 1.21*  CO2 24 24 20* 19* 21* 17* 15* 21* 21* 20*   Elevated lipase, AST, ammonia No history of liver disease.  Elevated AST and ALT likely because of rhabdomyolysis. CT abdomen pelvis without any hepatobiliary or pancreatic etiology. Continue to monitor.  acute hepatitis panel nonreactive Recent Labs  Lab 07/12/22 0556 07/13/22 0609 07/15/22 2243 07/16/22 0134 07/16/22 0515 07/17/22 0305 07/18/22 0318  AST 10* 9* 84*  --  149* 165* 149*  ALT 8 7 33  --  44 57* 59*  ALKPHOS 79 65 80  --  78 88 98  BILITOT 1.3* 1.3* 1.6*  --  1.6* 1.2 1.4*  BILIDIR 0.6* 0.6*  --   --   --   --   --   IBILI 0.7 0.7  --   --   --   --   --   PROT 5.3* 4.8* 5.2*  --  5.1* 4.6* 5.3*  ALBUMIN 2.4* 2.3* 2.3*  --  2.3* 2.1* 2.3*  AMMONIA  --   --   --  37*  --  17  --   INR  --   --   --  1.5*  --   --   --   LIPASE 105* 100* 132*  --   --   --   --   PLT  --   --  309  --  293 319 834   Acute metabolic encephalopathy Likely secondary to severe sepsis/infection/hypothermia and dehydration.   CT head unremarkable.  Ammonia level not elevated.  No evidence of meningeal signs. Patient mental status remains altered.  She is oriented to place only.  Chronic normocytic anemia Hemoglobin has been running less than 10 since she was hospitalized in October.  Presented with hemoglobin of 7.9.  No evidence of active bleeding.  Anemia panel as below.  Hemoglobin this morning low at 7.6.  Stable in last 3 days.  Recheck tomorrow.  Transfuse if less than  7. Continue PPI, iron supplements, folic acid Recent Labs    07/09/22 1606 07/11/22 0553 07/12/22 0556 07/15/22 2243 07/16/22 0515 07/17/22 0305 07/18/22 0318  HGB  --    < > 8.0* 7.9* 7.9* 7.4* 7.6*  MCV  --    < >  --  80.4 80.1 82.6 83.8  VITAMINB12 1,104*  --   --   --   --   --   --   FOLATE 5.9*  --   --   --   --   --   --   FERRITIN 269  --   --   --   --   --   --   TIBC 160*  --   --   --   --   --   --  IRON 80  --   --   --   --   --   --    < > = values in this interval not displayed.   Hypertension PTA on telmisartan 80 mg daily, Demadex 10 mg daily PRN. Currently on telmisartan.  Demadex on hold.   Hyperlipidemia Statin on hold due to elevated transaminases  Anxiety/depression Not on meds  Impaired mobility PT eval obtained.  SNF recommended  Goals of care   Code Status: Full Code    Mobility: Needs PT eval  Infusions:   sodium chloride 125 mL/hr at 07/18/22 1523    Scheduled Meds:  vitamin C  500 mg Oral Daily   Chlorhexidine Gluconate Cloth  6 each Topical Daily   feeding supplement  1 Container Oral TID BM   irbesartan  300 mg Oral Daily   multivitamin with minerals  1 tablet Oral Daily   Vitamin D (Ergocalciferol)  50,000 Units Oral Q7 days    PRN meds: HYDROmorphone (DILAUDID) injection, mouth rinse   Skin assessment:     Nutritional status:  Body mass index is 30.05 kg/m.  Nutrition Problem: Moderate Malnutrition Etiology: chronic illness Signs/Symptoms: moderate muscle depletion, moderate fat depletion     Diet:  Diet Order             DIET DYS 3 Room service appropriate? Yes; Fluid consistency: Thin  Diet effective now                   DVT prophylaxis:  Place and maintain sequential compression device Start: 07/16/22 0507   Antimicrobials: Stop antibiotics today Fluid: NS at 125 mill per hour to continue today because of rhabdomyolysis Consultants: None Family Communication: None at bedside. Tried to reach  patient's son [name] on the phone, no success.  Status is: Inpatient  Continue in-hospital care because: Needs IV fluid, further monitoring for mental status Level of care: Stepdown   Dispo: The patient is from: Home              Anticipated d/c is to: Pending clinical course              Patient currently is not medically stable to d/c.   Difficult to place patient No    Antimicrobials: Anti-infectives (From admission, onward)    Start     Dose/Rate Route Frequency Ordered Stop   07/18/22 0100  vancomycin (VANCOCIN) IVPB 1000 mg/200 mL premix  Status:  Discontinued        1,000 mg 200 mL/hr over 60 Minutes Intravenous Every 48 hours 07/16/22 0506 07/17/22 1055   07/18/22 0100  vancomycin (VANCOREADY) IVPB 1250 mg/250 mL  Status:  Discontinued        1,250 mg 166.7 mL/hr over 90 Minutes Intravenous Every 48 hours 07/17/22 1055 07/18/22 1016   07/16/22 1200  aztreonam (AZACTAM) 1 g in sodium chloride 0.9 % 100 mL IVPB  Status:  Discontinued        1 g 200 mL/hr over 30 Minutes Intravenous Every 8 hours 07/16/22 0506 07/18/22 1016   07/16/22 1100  metroNIDAZOLE (FLAGYL) IVPB 500 mg  Status:  Discontinued        500 mg 100 mL/hr over 60 Minutes Intravenous Every 12 hours 07/16/22 0447 07/18/22 1016   07/16/22 0500  aztreonam (AZACTAM) 2 g in sodium chloride 0.9 % 100 mL IVPB  Status:  Discontinued        2 g 200 mL/hr over 30 Minutes Intravenous  Once  07/16/22 0446 07/16/22 0451   07/16/22 0500  metroNIDAZOLE (FLAGYL) IVPB 500 mg  Status:  Discontinued        500 mg 100 mL/hr over 60 Minutes Intravenous Every 12 hours 07/16/22 0446 07/16/22 0447   07/16/22 0500  vancomycin (VANCOCIN) IVPB 1000 mg/200 mL premix  Status:  Discontinued        1,000 mg 200 mL/hr over 60 Minutes Intravenous  Once 07/16/22 0446 07/16/22 0447   07/15/22 2330  meropenem (MERREM) 1 g in sodium chloride 0.9 % 100 mL IVPB        1 g 200 mL/hr over 30 Minutes Intravenous  Once 07/15/22 2318 07/16/22 0107    07/15/22 2315  metroNIDAZOLE (FLAGYL) IVPB 500 mg        500 mg 100 mL/hr over 60 Minutes Intravenous  Once 07/15/22 2302 07/16/22 0107   07/15/22 2315  vancomycin (VANCOCIN) IVPB 1000 mg/200 mL premix  Status:  Discontinued        1,000 mg 200 mL/hr over 60 Minutes Intravenous  Once 07/15/22 2302 07/15/22 2305   07/15/22 2315  vancomycin (VANCOREADY) IVPB 2000 mg/400 mL        2,000 mg 200 mL/hr over 120 Minutes Intravenous  Once 07/15/22 2306 07/16/22 0314       Objective: Vitals:   07/18/22 1400 07/18/22 1500  BP: (!) 164/92 (!) 170/84  Pulse: (!) 106 (!) 110  Resp: 15 11  Temp: 97.9 F (36.6 C) 98.1 F (36.7 C)  SpO2: 100% 100%    Intake/Output Summary (Last 24 hours) at 07/18/2022 1555 Last data filed at 07/18/2022 0900 Gross per 24 hour  Intake 1382.97 ml  Output 1150 ml  Net 232.97 ml   Filed Weights   07/15/22 2123 07/16/22 1049  Weight: 93.4 kg 81.9 kg   Weight change:  Body mass index is 30.05 kg/m.   Physical Exam: General exam: Pleasant, elderly African-American female.  Not in pain Skin: No rashes, lesions or ulcers. HEENT: Atraumatic, normocephalic, no obvious bleeding Lungs: Clear to auscultation bilaterally CVS: Regular rate and rhythm, no murmur GI/Abd soft, nontender, nondistended, bowel sound present CNS: Alert, awake, oriented to place only.  Has mittens on her hands today.  Not restless or agitated at the time of my evaluation Psychiatry: Sad affect Extremities: No pedal edema, no calf tenderness  Data Review: I have personally reviewed the laboratory data and studies available.  F/u labs ordered Unresulted Labs (From admission, onward)     Start     Ordered   07/19/22 0500  CK  Daily,   R     Question:  Specimen collection method  Answer:  Lab=Lab collect   07/18/22 0759   07/17/22 0500  CBC with Differential/Platelet  Daily,   R      07/16/22 1427   07/17/22 0500  Comprehensive metabolic panel  Daily,   R      07/16/22 1429    07/16/22 0449  Gastrointestinal Panel by PCR , Stool  (Gastrointestinal Panel by PCR, Stool                                                                                                                                                     **  Does Not include CLOSTRIDIUM DIFFICILE testing. **If CDIFF testing is needed, place order from the "C Difficile Testing" order set.**)  Once,   R        07/16/22 0448            Signed, Terrilee Croak, MD [facility] 07/18/2022

## 2022-07-18 NOTE — Progress Notes (Addendum)
PT Cancellation Note  Patient Details Name: NAEEMAH JASMER MRN: [id number] DOB: 05-May-1951   Cancelled Treatment:    Reason Eval/Treat Not Completed: Medical issues which prohibited therapy  Made attempt to move patient, moaning, MS not such that patient  can cooperate. Will check back another time. Blanchard Kelch PT Acute Rehabilitation Services Office [phone] Weekend pager-[phone]  Rada Hay 07/18/2022, 3:39 PM

## 2022-07-19 DIAGNOSIS — R6510 Systemic inflammatory response syndrome (SIRS) of non-infectious origin without acute organ dysfunction: Secondary | ICD-10-CM | POA: Diagnosis not present

## 2022-07-19 LAB — COMPREHENSIVE METABOLIC PANEL
ALT: 57 U/L — ABNORMAL HIGH (ref 0–44)
AST: 119 U/L — ABNORMAL HIGH (ref 15–41)
Albumin: 2.2 g/dL — ABNORMAL LOW (ref 3.5–5.0)
Alkaline Phosphatase: 82 U/L (ref 38–126)
Anion gap: 11 (ref 5–15)
BUN: 18 mg/dL (ref 8–23)
CO2: 21 mmol/L — ABNORMAL LOW (ref 22–32)
Calcium: 8.8 mg/dL — ABNORMAL LOW (ref 8.9–10.3)
Chloride: 117 mmol/L — ABNORMAL HIGH (ref 98–111)
Creatinine, Ser: 0.85 mg/dL (ref 0.44–1.00)
GFR, Estimated: >60 mL/min (ref >60)
Glucose, Bld: 79 mg/dL (ref 70–99)
Potassium: 2.6 mmol/L — CL (ref 3.5–5.1)
Sodium: 149 mmol/L — ABNORMAL HIGH (ref 135–145)
Total Bilirubin: 0.8 mg/dL (ref 0.3–1.2)
Total Protein: 4.9 g/dL — ABNORMAL LOW (ref 6.5–8.1)

## 2022-07-19 LAB — CBC WITH DIFFERENTIAL/PLATELET
Abs Immature Granulocytes: 0.29 10*3/uL — ABNORMAL HIGH (ref 0.00–0.07)
Basophils Absolute: 0 10*3/uL (ref 0.0–0.1)
Basophils Relative: 0 %
Eosinophils Absolute: 0.1 10*3/uL (ref 0.0–0.5)
Eosinophils Relative: 1 %
HCT: 20.6 % — ABNORMAL LOW (ref 36.0–46.0)
Hemoglobin: 6.5 g/dL — CL (ref 12.0–15.0)
Immature Granulocytes: 3 %
Lymphocytes Relative: 10 %
Lymphs Abs: 1.1 10*3/uL (ref 0.7–4.0)
MCH: 26.5 pg (ref 26.0–34.0)
MCHC: 31.6 g/dL (ref 30.0–36.0)
MCV: 84.1 fL (ref 80.0–100.0)
Monocytes Absolute: 1.2 10*3/uL — ABNORMAL HIGH (ref 0.1–1.0)
Monocytes Relative: 10 %
Neutro Abs: 8.7 10*3/uL — ABNORMAL HIGH (ref 1.7–7.7)
Neutrophils Relative %: 76 %
Platelets: 274 10*3/uL (ref 150–400)
RBC: 2.45 MIL/uL — ABNORMAL LOW (ref 3.87–5.11)
RDW: 18.4 % — ABNORMAL HIGH (ref 11.5–15.5)
WBC: 11.5 10*3/uL — ABNORMAL HIGH (ref 4.0–10.5)
nRBC: 2.2 % — ABNORMAL HIGH (ref 0.0–0.2)

## 2022-07-19 LAB — HEMOGLOBIN AND HEMATOCRIT, BLOOD
HCT: 26.4 % — ABNORMAL LOW (ref 36.0–46.0)
Hemoglobin: 8.7 g/dL — ABNORMAL LOW (ref 12.0–15.0)

## 2022-07-19 LAB — ABO/RH: ABO/RH(D): O POS

## 2022-07-19 LAB — TSH: TSH: 4.018 u[IU]/mL (ref 0.350–4.500)

## 2022-07-19 LAB — PREPARE RBC (CROSSMATCH)

## 2022-07-19 LAB — CORTISOL: Cortisol, Plasma: 20.1 ug/dL

## 2022-07-19 LAB — CK: Total CK: 1170 U/L — ABNORMAL HIGH (ref 38–234)

## 2022-07-19 MED ORDER — POTASSIUM CHLORIDE 20 MEQ PO PACK
60.0000 meq | PACK | Freq: Once | ORAL | Status: AC
  Administered 2022-07-19: 60 meq via ORAL
  Filled 2022-07-19: qty 3

## 2022-07-19 MED ORDER — POTASSIUM CHLORIDE 10 MEQ/100ML IV SOLN
10.0000 meq | INTRAVENOUS | Status: AC
  Administered 2022-07-19 (×3): 10 meq via INTRAVENOUS
  Filled 2022-07-19 (×3): qty 100

## 2022-07-19 MED ORDER — SODIUM CHLORIDE 0.9% IV SOLUTION: Freq: Once | INTRAVENOUS | Status: AC

## 2022-07-19 MED ORDER — DEXTROSE-NACL 5-0.45 % IV SOLN: INTRAVENOUS | Status: DC

## 2022-07-19 NOTE — Progress Notes (Signed)
PROGRESS NOTE  Stephanie Benton  DOB: 26-Jul-1951  PCP: [name], MD QIO:[id number]  DOA: 07/15/2022  LOS: 3 days  Hospital Day: 5  Brief narrative: Stephanie Benton is a 71 y.o. female with PMH significant for HTN, HLD, chronic abdominal pain, anxiety, bipolar disorder who had cholecystectomy 2 months ago.  Recently admitted 12/3-12/9 for acute on chronic abdominal pain felt to be due to acute on chronic pancreatitis.  She was also noted to be anemic and had an EGD done which showed esophagitis. 12/11, patient was brought to the ED from home by EMS for altered mental status.  Per history, she slid off her bed 12/10 and remained on the floor for several hours when her family found her and called EMS. On arrival to the ED, patient was very cool to touch and rectal temperature 92.2 F.  She was placed on [facility].  Labs showed WBC count 12.9, hemoglobin 7.9 (was 8.0 on labs done 4 days ago), bicarb 15, glucose 131, creatinine 1.9 (baseline 0.5-0.8),  AST 84, T. bili 1.6, ALT and alk phos normal, lipase 132,  CK 1905, lactic acid 7.6> 4.3. Blood cultures pending COVID and influenza PCR negative UA is not suggestive of infection INR 1.5, ammonia 37,  Chest x-ray did not suggest pneumonia.   CT abdomen pelvis showed fluid-filled colon with scattered air-fluid levels which could reflect a diarrheal illness.   CT head/C-spine and x-rays of right knee and bilateral hips/pelvis were negative for acute finding.  Patient was given vancomycin, meropenem, metronidazole, and 2 L LR boluses.   Admitted to [facility]  Subjective: Patient was seen and examined this morning. Alert, awake, but not able to have a conversation, unable to follow commands.  Has mittens on hand.  Labs from this morning noted.  CK trending down but sodium and chloride are both elevated I called and and discussed about patient with her son this morning.  Assessment and plan: SIRS POA Sepsis ruled out Prior to admission,  patient was on the floor for several hours without access to water which probably is the cause of hypothermia, tachycardia, leukocytosis, lactic acidosis.  No history of any diarrhea, fever, urinary symptoms, cough, shortness of breath at home. Initial workup did not show any evidence of infection as supported by negative COVID and influenza PCR, unremarkable UA, unremarkable chest x-ray Patient was placed on B[employer] hug[employer] with subsequent improvement in temperature Given broad-spectrum IV antibiotics in the ED  No growth on blood culture.  No source of infection identified.  Sepsis ruled out. Antibiotics stopped. Lactic acid improved with hydration Recent Labs  Lab 07/15/22 2243 07/16/22 0134 07/16/22 0515 07/17/22 0305 07/18/22 0318 07/19/22 0252  WBC 12.9*  --  14.5* 15.1* 13.8* 11.5*  LATICACIDVEN 7.6* 4.3*  --  1.5  --   --   Acute metabolic encephalopathy Likely secondary to to severe dehydration, rhabdomyolysis, electrolyte abnormalities CT head unremarkable.  Ammonia level not elevated.  No evidence of meningeal signs. Patient mental status remains altered.  She is oriented to place only.   Rhabdomyolysis Patient was found down on the floor for greater than 24 hours.   CK trend as below. Gradually improving. Continue IV hydration. Recent Labs  Lab 07/15/22 2230 07/16/22 0515 07/17/22 0305 07/18/22 0318 07/19/22 0252  CKTOTAL 1,905* 2,971* 2,617* 2,396* 1,170*    AKI Baseline creatinine less than 1.  Presented with creatinine elevated 1.92, serum bicarb low at 15..  Likely prerenal in the setting of severe sepsis, dehydration  and rhabdomyolysis.  CT without evidence of obstructive uropathy. Renal function improving gradually with IV hydration.  Continue to monitor with IV fluid. Recent Labs    06/25/22 0347 06/30/22 1353 07/07/22 2232 07/09/22 0513 07/10/22 0554 07/15/22 2243 07/16/22 0515 07/17/22 0305 07/18/22 0318 07/19/22 0252  BUN <5* 5* 6* 8 7* _0 CREATININE 0.81 0.95 0.80 0.75 0.55 1.97* 1.92* 1.64* 1.21* 0.85  CO2 24 20* 19* 21* 17* 15* 21* 21* 20* 21*   Elevated lipase, AST, ammonia No history of liver disease.  Elevated AST and ALT likely because of rhabdomyolysis. CT abdomen pelvis without any hepatobiliary or pancreatic etiology. acute hepatitis panel nonreactive Recent Labs  Lab 07/13/22 0609 07/15/22 2243 07/16/22 0134 07/16/22 0515 07/17/22 0305 07/18/22 0318 07/19/22 0252  AST 9* 84*  --  149* 165* 149* 119*  ALT 7 33  --  44 57* 59* 57*  ALKPHOS 65 80  --  78 88 98 82  BILITOT 1.3* 1.6*  --  1.6* 1.2 1.4* 0.8  BILIDIR 0.6*  --   --   --   --   --   --   IBILI 0.7  --   --   --   --   --   --   PROT 4.8* 5.2*  --  5.1* 4.6* 5.3* 4.9*  ALBUMIN 2.3* 2.3*  --  2.3* 2.1* 2.3* 2.2*  AMMONIA  --   --  37*  --  17  --   --   INR  --   --  1.5*  --   --   --   --   LIPASE 100* 132*  --   --   --   --   --   PLT  --  309  --  293 319 273 274   Hyponatremia Sodium and chloride level are both elevated this morning.  Probably because of normal saline.  I switched IV fluid to D5 half NS.  Continue to monitor. Recent Labs  Lab 07/15/22 2243 07/16/22 0515 07/17/22 0305 07/18/22 0318 07/19/22 0252  NA 139 140 142 141 149*   Sinus tachycardia Heart rate remains elevated in the 100s despite significant volume resuscitation, stable blood pressure, improved lactic acid.  She is not in pain. Last echo from October 2023 with EF 60 to 65%, mild LVH, grade 1 diastolic dysfunction. Continue to monitor in telemetry. Will obtain TSH and cortisol level for completion of workup  Questionable history of diarrhea CT abdomen pelvis showing fluid-filled colon with scattered air-fluid levels which could reflect a diarrheal illness.  Patient is unable to recall all the events prior to presentation. Per nursing staff, she has not had any diarrhea since she has been hospitalized.  Chronic normocytic anemia Hemoglobin has been  running less than 10 since she was hospitalized in October.  Presented with hemoglobin of 7.9.  No evidence of active bleeding.  Anemia panel as below.  Hemoglobin this morning low at 7.6.  Stable in last 3 days.  Recheck tomorrow.  Transfuse if less than 7. Continue PPI, iron supplements, folic acid Recent Labs    07/09/22 1606 07/11/22 0553 07/15/22 2243 07/16/22 0515 07/17/22 0305 07/18/22 0318 07/19/22 0252  HGB  --    < > 7.9* 7.9* 7.4* 7.6* 6.5*  MCV  --    < > 80.4 80.1 82.6 83.8 84.1  VITAMINB12 1,104*  --   --   --   --   --   --  FOLATE 5.9*  --   --   --   --   --   --   FERRITIN 269  --   --   --   --   --   --   TIBC 160*  --   --   --   --   --   --   IRON 80  --   --   --   --   --   --    < > = values in this interval not displayed.   Hypertension PTA on telmisartan 80 mg daily, Demadex 10 mg daily PRN. Currently on telmisartan.  Demadex on hold.   Hyperlipidemia Statin on hold due to elevated transaminases  Anxiety/depression Not on meds  Impaired mobility PT eval obtained.  SNF recommended  Goals of care   Code Status: Full Code.  I had a long conversation with patient's son Mr. Lior Cartelli this morning.  He does not live in town.  He will be visiting her tomorrow.  He states he will make a decision on her CODE STATUS and long-term plan tomorrow.  Remains full code for now.  Mobility: Needs PT eval  Infusions:   dextrose 5 % and 0.45% NaCl 75 mL/hr at 07/19/22 1034    Scheduled Meds:  vitamin C  500 mg Oral Daily   Chlorhexidine Gluconate Cloth  6 each Topical Daily   feeding supplement  1 Container Oral TID BM   irbesartan  300 mg Oral Daily   multivitamin with minerals  1 tablet Oral Daily   Vitamin D (Ergocalciferol)  50,000 Units Oral Q7 days    PRN meds: HYDROmorphone (DILAUDID) injection, mouth rinse   Skin assessment:     Nutritional status:  Body mass index is 30.05 kg/m.  Nutrition Problem: Moderate Malnutrition Etiology:  chronic illness Signs/Symptoms: moderate muscle depletion, moderate fat depletion     Diet:  Diet Order             DIET DYS 3 Room service appropriate? Yes; Fluid consistency: Thin  Diet effective now                   DVT prophylaxis:  Place and maintain sequential compression device Start: 07/16/22 0507   Antimicrobials: Not on antibiotics Fluid: Switched to D5 half NS at 53 mill per hour Consultants: None Family Communication: As above  Status is: Inpatient  Continue in-hospital care because: Not improving clinically.  Prognosis guarded Level of care: Progressive   Dispo: The patient is from: Home              Anticipated d/c is to: Pending clinical course              Patient currently is not medically stable to d/c.   Difficult to place patient No    Antimicrobials: Anti-infectives (From admission, onward)    Start     Dose/Rate Route Frequency Ordered Stop   07/18/22 0100  vancomycin (VANCOCIN) IVPB 1000 mg/200 mL premix  Status:  Discontinued        1,000 mg 200 mL/hr over 60 Minutes Intravenous Every 48 hours 07/16/22 0506 07/17/22 1055   07/18/22 0100  vancomycin (VANCOREADY) IVPB 1250 mg/250 mL  Status:  Discontinued        1,250 mg 166.7 mL/hr over 90 Minutes Intravenous Every 48 hours 07/17/22 1055 07/18/22 1016   07/16/22 1200  aztreonam (AZACTAM) 1 g in sodium chloride 0.9 % 100 mL IVPB  Status:  Discontinued        1 g 200 mL/hr over 30 Minutes Intravenous Every 8 hours 07/16/22 0506 07/18/22 1016   07/16/22 1100  metroNIDAZOLE (FLAGYL) IVPB 500 mg  Status:  Discontinued        500 mg 100 mL/hr over 60 Minutes Intravenous Every 12 hours 07/16/22 0447 07/18/22 1016   07/16/22 0500  aztreonam (AZACTAM) 2 g in sodium chloride 0.9 % 100 mL IVPB  Status:  Discontinued        2 g 200 mL/hr over 30 Minutes Intravenous  Once 07/16/22 0446 07/16/22 0451   07/16/22 0500  metroNIDAZOLE (FLAGYL) IVPB 500 mg  Status:  Discontinued        500 mg 100 mL/hr  over 60 Minutes Intravenous Every 12 hours 07/16/22 0446 07/16/22 0447   07/16/22 0500  vancomycin (VANCOCIN) IVPB 1000 mg/200 mL premix  Status:  Discontinued        1,000 mg 200 mL/hr over 60 Minutes Intravenous  Once 07/16/22 0446 07/16/22 0447   07/15/22 2330  meropenem (MERREM) 1 g in sodium chloride 0.9 % 100 mL IVPB        1 g 200 mL/hr over 30 Minutes Intravenous  Once 07/15/22 2318 07/16/22 0107   07/15/22 2315  metroNIDAZOLE (FLAGYL) IVPB 500 mg        500 mg 100 mL/hr over 60 Minutes Intravenous  Once 07/15/22 2302 07/16/22 0107   07/15/22 2315  vancomycin (VANCOCIN) IVPB 1000 mg/200 mL premix  Status:  Discontinued        1,000 mg 200 mL/hr over 60 Minutes Intravenous  Once 07/15/22 2302 07/15/22 2305   07/15/22 2315  vancomycin (VANCOREADY) IVPB 2000 mg/400 mL        2,000 mg 200 mL/hr over 120 Minutes Intravenous  Once 07/15/22 2306 07/16/22 0314       Objective: Vitals:   07/19/22 1021 07/19/22 1045  BP: (!) 140/78 (!) 152/79  Pulse: (!) 103 (!) 106  Resp: 12 12  Temp: 98.6 F (37 C) 98.4 F (36.9 C)  SpO2: 96% 96%    Intake/Output Summary (Last 24 hours) at 07/19/2022 1114 Last data filed at 07/19/2022 1034 Gross per 24 hour  Intake 3646.08 ml  Output 450 ml  Net 3196.08 ml   Filed Weights   07/15/22 2123 07/16/22 1049  Weight: 93.4 kg 81.9 kg   Weight change:  Body mass index is 30.05 kg/m.   Physical Exam: General exam: Pleasant, elderly African-American female.  Not in pain Skin: No rashes, lesions or ulcers. HEENT: Atraumatic, normocephalic, no obvious bleeding Lungs: Clear to auscultation bilaterally CVS: Regular rate and rhythm, no murmur GI/Abd soft, nontender, nondistended, bowel sound present CNS: Alert, awake, unable to answer any questions to me today.  Unable to follow command.  Has mittens on her hands today.  Not restless or agitated at the time of my evaluation Psychiatry: Sad affect Extremities: No pedal edema, no calf  tenderness  Data Review: I have personally reviewed the laboratory data and studies available.  F/u labs ordered Unresulted Labs (From admission, onward)     Start     Ordered   07/20/22 0973  Basic metabolic panel  Daily,   R     Question:  Specimen collection method  Answer:  Lab=Lab collect   07/19/22 0751   07/20/22 0500  CBC with Differential/Platelet  Daily,   R     Question:  Specimen collection method  Answer:  Lab=Lab collect  07/19/22 0751   07/19/22 1111  TSH  Add-on,   AD       Question:  Specimen collection method  Answer:  Lab=Lab collect   07/19/22 1110   07/19/22 1111  Cortisol  Add-on,   AD       Question:  Specimen collection method  Answer:  Lab=Lab collect   07/19/22 1110   07/19/22 0500  CK  Daily,   R     Question:  Specimen collection method  Answer:  Lab=Lab collect   07/18/22 0759   07/16/22 0449  Gastrointestinal Panel by PCR , Stool  (Gastrointestinal Panel by PCR, Stool                                                                                                                                                     **Does Not include CLOSTRIDIUM DIFFICILE testing. **If CDIFF testing is needed, place order from the "C Difficile Testing" order set.**)  Once,   R        07/16/22 0448            Signed, Terrilee Croak, MD [facility] 07/19/2022

## 2022-07-20 DIAGNOSIS — G9341 Metabolic encephalopathy: Secondary | ICD-10-CM | POA: Diagnosis not present

## 2022-07-20 LAB — BPAM RBC
Blood Product Expiration Date: 202401162359
ISSUE DATE / TIME: 202312151015
Unit Type and Rh: 5100

## 2022-07-20 LAB — CBC WITH DIFFERENTIAL/PLATELET
Abs Immature Granulocytes: 0 10*3/uL (ref 0.00–0.07)
Band Neutrophils: 1 %
Basophils Absolute: 0 10*3/uL (ref 0.0–0.1)
Basophils Relative: 0 %
Eosinophils Absolute: 0.4 10*3/uL (ref 0.0–0.5)
Eosinophils Relative: 3 %
HCT: 30.6 % — ABNORMAL LOW (ref 36.0–46.0)
Hemoglobin: 9.9 g/dL — ABNORMAL LOW (ref 12.0–15.0)
Lymphocytes Relative: 9 %
Lymphs Abs: 1.1 10*3/uL (ref 0.7–4.0)
MCH: 26.7 pg (ref 26.0–34.0)
MCHC: 32.4 g/dL (ref 30.0–36.0)
MCV: 82.5 fL (ref 80.0–100.0)
Monocytes Absolute: 0.6 10*3/uL (ref 0.1–1.0)
Monocytes Relative: 5 %
Neutro Abs: 10.1 10*3/uL — ABNORMAL HIGH (ref 1.7–7.7)
Neutrophils Relative %: 82 %
Platelets: 247 10*3/uL (ref 150–400)
RBC: 3.71 MIL/uL — ABNORMAL LOW (ref 3.87–5.11)
RDW: 18.7 % — ABNORMAL HIGH (ref 11.5–15.5)
WBC: 12.2 10*3/uL — ABNORMAL HIGH (ref 4.0–10.5)
nRBC: 1.5 % — ABNORMAL HIGH (ref 0.0–0.2)
nRBC: 4 /100 WBC — ABNORMAL HIGH

## 2022-07-20 LAB — BASIC METABOLIC PANEL
Anion gap: 6 (ref 5–15)
BUN: 13 mg/dL (ref 8–23)
CO2: 20 mmol/L — ABNORMAL LOW (ref 22–32)
Calcium: 8.7 mg/dL — ABNORMAL LOW (ref 8.9–10.3)
Chloride: 117 mmol/L — ABNORMAL HIGH (ref 98–111)
Creatinine, Ser: 0.74 mg/dL (ref 0.44–1.00)
GFR, Estimated: >60 mL/min (ref >60)
Glucose, Bld: 116 mg/dL — ABNORMAL HIGH (ref 70–99)
Potassium: 2.9 mmol/L — ABNORMAL LOW (ref 3.5–5.1)
Sodium: 143 mmol/L (ref 135–145)

## 2022-07-20 LAB — TYPE AND SCREEN
ABO/RH(D): O POS
Antibody Screen: NEGATIVE
Unit division: 0

## 2022-07-20 LAB — ZINC: Zinc: 57 ug/dL (ref 44–115)

## 2022-07-20 LAB — CK: Total CK: 402 U/L — ABNORMAL HIGH (ref 38–234)

## 2022-07-20 MED ORDER — POLYETHYLENE GLYCOL 3350 17 G PO PACK
17.0000 g | PACK | Freq: Every day | ORAL | Status: DC | PRN
  Filled 2022-07-20: qty 1

## 2022-07-20 MED ORDER — POTASSIUM CHLORIDE 20 MEQ PO PACK
40.0000 meq | PACK | ORAL | Status: AC
  Administered 2022-07-20 (×2): 40 meq via ORAL
  Filled 2022-07-20 (×2): qty 2

## 2022-07-20 NOTE — Progress Notes (Signed)
PROGRESS NOTE  Stephanie Benton  DOB: 1951-01-26  PCP: [name], MD SUP:[id number]  DOA: 07/15/2022  LOS: 4 days  Hospital Day: 6  Brief narrative: Stephanie Benton is a 71 y.o. female with PMH significant for HTN, HLD, chronic abdominal pain, anxiety, bipolar disorder who had cholecystectomy 2 months ago.  Recently admitted 12/3-12/9 for acute on chronic abdominal pain felt to be due to acute on chronic pancreatitis.  She was also noted to be anemic and had an EGD done which showed esophagitis. 12/11, patient was brought to the ED from home by EMS for altered mental status.  Per history, she slid off her bed 12/10 and remained on the floor for several hours when her family found her and called EMS. On arrival to the ED, patient was very cool to touch and rectal temperature 92.2 F.  She was placed on [facility].  Labs showed WBC count 12.9, hemoglobin 7.9 (was 8.0 on labs done 4 days ago), bicarb 15, glucose 131, creatinine 1.9 (baseline 0.5-0.8),  AST 84, T. bili 1.6, ALT and alk phos normal, lipase 132,  CK 1905, lactic acid 7.6> 4.3. Blood cultures pending COVID and influenza PCR negative UA is not suggestive of infection INR 1.5, ammonia 37,  Chest x-ray did not suggest pneumonia.   CT abdomen pelvis showed fluid-filled colon with scattered air-fluid levels which could reflect a diarrheal illness.   CT head/C-spine and x-rays of right knee and bilateral hips/pelvis were negative for acute finding.  Patient was given vancomycin, meropenem, metronidazole, and 2 L LR boluses.   Admitted to [facility]  Subjective: Patient was seen and examined this morning. Alert, awake, knows she is in the hospital.  Inconsistently follows commands.  Family not at bedside.  Assessment and plan: SIRS POA Sepsis ruled out Prior to admission, patient was on the floor for several hours without access to water which probably is the cause of hypothermia, tachycardia, leukocytosis, lactic acidosis.  No  history of any diarrhea, fever, urinary symptoms, cough, shortness of breath at home. Initial workup did not show any evidence of infection as supported by negative COVID and influenza PCR, unremarkable UA, unremarkable chest x-ray Patient was placed on B[employer] hugger with subsequent improvement in temperature Given broad-spectrum IV antibiotics in the ED  No growth on blood culture.  No source of infection identified.  Sepsis ruled out. Antibiotics stopped. Lactic acid improved with hydration Recent Labs  Lab 07/15/22 2243 07/16/22 0134 07/16/22 0515 07/17/22 0305 07/18/22 0318 07/19/22 0252 07/20/22 0317  WBC 12.9*  --  14.5* 15.1* 13.8* 11.5* 12.2*  LATICACIDVEN 7.6* 4.3*  --  1.5  --   --   --   Acute metabolic encephalopathy Likely secondary to to severe dehydration, rhabdomyolysis, electrolyte abnormalities CT head unremarkable.  Ammonia level not elevated.  No evidence of meningeal signs. Somewhat improved mental status today but is still inconsistently following commands.   Rhabdomyolysis Patient was found down on the floor for greater than 24 hours.   CK trend as below.  Significantly improved.   Recent Labs  Lab 07/15/22 2230 07/16/22 0515 07/17/22 0305 07/18/22 0318 07/19/22 0252 07/20/22 0550  CKTOTAL 1,905* 2,971* 2,617* 2,396* 1,170* 402*    AKI Baseline creatinine less than 1.  Presented with creatinine elevated 1.92, serum bicarb low at 15..  Likely prerenal in the setting of severe sepsis, dehydration and rhabdomyolysis.  CT scan without evidence of obstructive uropathy. Renal function improved gradually with IV hydration.  Recent Labs  06/30/22 1353 07/07/22 2232 07/09/22 0513 07/10/22 0554 07/15/22 2243 07/16/22 0515 07/17/22 0305 07/18/22 0318 07/19/22 0252 07/20/22 0550  BUN 5* 6* 8 7* _0 CREATININE 0.95 0.80 0.75 0.55 1.97* 1.92* 1.64* 1.21* 0.85 0.74  CO2 20* 19* 21* 17* 15* 21* 21* 20* 21* 20*   Elevated lipase, AST,  ammonia No history of liver disease.  Elevated AST and ALT likely because of rhabdomyolysis. CT abdomen pelvis without any hepatobiliary or pancreatic etiology. acute hepatitis panel nonreactive Recent Labs  Lab 07/15/22 2243 07/16/22 0134 07/16/22 0515 07/17/22 0305 07/18/22 0318 07/19/22 0252 07/20/22 0317  AST 84*  --  149* 165* 149* 119*  --   ALT 33  --  44 57* 59* 57*  --   ALKPHOS 80  --  78 88 98 82  --   BILITOT 1.6*  --  1.6* 1.2 1.4* 0.8  --   PROT 5.2*  --  5.1* 4.6* 5.3* 4.9*  --   ALBUMIN 2.3*  --  2.3* 2.1* 2.3* 2.2*  --   AMMONIA  --  37*  --  17  --   --   --   INR  --  1.5*  --   --   --   --   --   LIPASE 132*  --   --   --   --   --   --   PLT 309  --  293 319 273 274 247   Hyponatremia Sodium level improved with hypertonic saline. Recent Labs  Lab 07/15/22 2243 07/16/22 0515 07/17/22 0305 07/18/22 0318 07/19/22 0252 07/20/22 0550  NA 139 140 142 141 149* 143   Hypertension PTA on telmisartan 80 mg daily, Demadex 10 mg daily PRN. Currently on telmisartan.  Demadex on hold. Last echo from October 2023 with EF 60 to 65%, mild LVH, grade 1 diastolic dysfunction.   Sinus tachycardia Heart rate gradually improving.  Continue to monitor in telemetry. Normal TSH and cortisol level.  Questionable history of diarrhea CT abdomen pelvis showing fluid-filled colon with scattered air-fluid levels which could reflect a diarrheal illness.  Patient is unable to recall all the events prior to presentation. Per nursing staff, she has not had any diarrhea since she has been hospitalized.   Chronic normocytic anemia Hemoglobin has been running less than 10 since she was hospitalized in October.  Presented with hemoglobin of 7.9.  It dipped down to the lowest of 6.5 on 12/15.  1 unit of PRBC was transfused.  Hemoglobin up at 9.9 today.  Continue to monitor Continue PPI, iron supplements, folic acid Recent Labs    07/09/22 1606 07/11/22 0553 07/17/22 0305  07/18/22 0318 07/19/22 0252 07/19/22 1533 07/20/22 0317  HGB  --    < > 7.4* 7.6* 6.5* 8.7* 9.9*  MCV  --    < > 82.6 83.8 84.1  --  82.5  VITAMINB12 1,104*  --   --   --   --   --   --   FOLATE 5.9*  --   --   --   --   --   --   FERRITIN 269  --   --   --   --   --   --   TIBC 160*  --   --   --   --   --   --   IRON 80  --   --   --   --   --   --    < > =  values in this interval not displayed.    Hyperlipidemia Statin on hold due to elevated transaminases  Anxiety/depression Not on meds  Impaired mobility PT eval obtained.  SNF recommended  Goals of care   Code Status: Full Code.  I had a long conversation with patient's son Mr. Jaquelin Meaney yesterday morning.  He does not live in town.  He will be visiting her today.  He states he will make a decision on her CODE STATUS and long-term plan this afternoon.  Remains full code for now.  Mobility: Needs PT eval  Infusions:     Scheduled Meds:  vitamin C  500 mg Oral Daily   Chlorhexidine Gluconate Cloth  6 each Topical Daily   feeding supplement  1 Container Oral TID BM   irbesartan  300 mg Oral Daily   multivitamin with minerals  1 tablet Oral Daily   Vitamin D (Ergocalciferol)  50,000 Units Oral Q7 days    PRN meds: HYDROmorphone (DILAUDID) injection, mouth rinse   Skin assessment:     Nutritional status:  Body mass index is 30.05 kg/m.  Nutrition Problem: Moderate Malnutrition Etiology: chronic illness Signs/Symptoms: moderate muscle depletion, moderate fat depletion     Diet:  Diet Order             DIET DYS 3 Room service appropriate? Yes; Fluid consistency: Thin  Diet effective now                   DVT prophylaxis:  Place and maintain sequential compression device Start: 07/16/22 0507   Antimicrobials: Not on antibiotics Fluid: Stop IV fluid today.  Encourage oral hydration Consultants: None Family Communication: As above  Status is: Inpatient  Continue in-hospital care because:  Not improving clinically.  Prognosis guarded Level of care: Progressive   Dispo: The patient is from: Home              Anticipated d/c is to: Pending clinical course              Patient currently is not medically stable to d/c.   Difficult to place patient No    Antimicrobials: Anti-infectives (From admission, onward)    Start     Dose/Rate Route Frequency Ordered Stop   07/18/22 0100  vancomycin (VANCOCIN) IVPB 1000 mg/200 mL premix  Status:  Discontinued        1,000 mg 200 mL/hr over 60 Minutes Intravenous Every 48 hours 07/16/22 0506 07/17/22 1055   07/18/22 0100  vancomycin (VANCOREADY) IVPB 1250 mg/250 mL  Status:  Discontinued        1,250 mg 166.7 mL/hr over 90 Minutes Intravenous Every 48 hours 07/17/22 1055 07/18/22 1016   07/16/22 1200  aztreonam (AZACTAM) 1 g in sodium chloride 0.9 % 100 mL IVPB  Status:  Discontinued        1 g 200 mL/hr over 30 Minutes Intravenous Every 8 hours 07/16/22 0506 07/18/22 1016   07/16/22 1100  metroNIDAZOLE (FLAGYL) IVPB 500 mg  Status:  Discontinued        500 mg 100 mL/hr over 60 Minutes Intravenous Every 12 hours 07/16/22 0447 07/18/22 1016   07/16/22 0500  aztreonam (AZACTAM) 2 g in sodium chloride 0.9 % 100 mL IVPB  Status:  Discontinued        2 g 200 mL/hr over 30 Minutes Intravenous  Once 07/16/22 0446 07/16/22 0451   07/16/22 0500  metroNIDAZOLE (FLAGYL) IVPB 500 mg  Status:  Discontinued  500 mg 100 mL/hr over 60 Minutes Intravenous Every 12 hours 07/16/22 0446 07/16/22 0447   07/16/22 0500  vancomycin (VANCOCIN) IVPB 1000 mg/200 mL premix  Status:  Discontinued        1,000 mg 200 mL/hr over 60 Minutes Intravenous  Once 07/16/22 0446 07/16/22 0447   07/15/22 2330  meropenem (MERREM) 1 g in sodium chloride 0.9 % 100 mL IVPB        1 g 200 mL/hr over 30 Minutes Intravenous  Once 07/15/22 2318 07/16/22 0107   07/15/22 2315  metroNIDAZOLE (FLAGYL) IVPB 500 mg        500 mg 100 mL/hr over 60 Minutes Intravenous  Once  07/15/22 2302 07/16/22 0107   07/15/22 2315  vancomycin (VANCOCIN) IVPB 1000 mg/200 mL premix  Status:  Discontinued        1,000 mg 200 mL/hr over 60 Minutes Intravenous  Once 07/15/22 2302 07/15/22 2305   07/15/22 2315  vancomycin (VANCOREADY) IVPB 2000 mg/400 mL        2,000 mg 200 mL/hr over 120 Minutes Intravenous  Once 07/15/22 2306 07/16/22 0314       Objective: Vitals:   07/20/22 0700 07/20/22 0800  BP: (!) 144/70 (!) 152/73  Pulse: 99 99  Resp: 15 12  Temp: 98.1 F (36.7 C) 98.1 F (36.7 C)  SpO2: 100% 100%    Intake/Output Summary (Last 24 hours) at 07/20/2022 1138 Last data filed at 07/20/2022 0800 Gross per 24 hour  Intake 2442.8 ml  Output 700 ml  Net 1742.8 ml   Filed Weights   07/15/22 2123 07/16/22 1049  Weight: 93.4 kg 81.9 kg   Weight change:  Body mass index is 30.05 kg/m.   Physical Exam: General exam: Pleasant, elderly African-American female.  Not in pain Skin: No rashes, lesions or ulcers. HEENT: Atraumatic, normocephalic, no obvious bleeding Lungs: Clear to auscultate bilaterally CVS: Regular rate and rhythm, no murmur GI/Abd soft, nontender, nondistended, bowel sound present CNS: Alert, awake, patient knows he is in the hospital.  Inconsistently follows commands  Psychiatry: Sad affect Extremities: No pedal edema, no calf tenderness  Data Review: I have personally reviewed the laboratory data and studies available.  F/u labs ordered Unresulted Labs (From admission, onward)     Start     Ordered   07/21/22 0500  Comprehensive metabolic panel  Daily,   R     Question:  Specimen collection method  Answer:  Lab=Lab collect   07/20/22 1136   07/20/22 0500  CBC with Differential/Platelet  Daily,   R     Question:  Specimen collection method  Answer:  Lab=Lab collect   07/19/22 0751   07/19/22 0500  CK  Daily,   R     Question:  Specimen collection method  Answer:  Lab=Lab collect   07/18/22 0759   07/16/22 0449  Gastrointestinal Panel  by PCR , Stool  (Gastrointestinal Panel by PCR, Stool                                                                                                                                                     **  Does Not include CLOSTRIDIUM DIFFICILE testing. **If CDIFF testing is needed, place order from the "C Difficile Testing" order set.**)  Once,   R        07/16/22 0448            Signed, Terrilee Croak, MD [facility] 07/20/2022

## 2022-07-20 NOTE — Progress Notes (Signed)
Met with patient's son Mr. [name] this afternoon.  Explained to him about patient's clinical condition, prognosis and the most likely natural course. He chose to make her DNR/DNI at this time.  He hopes she will get approved for SNF.

## 2022-07-20 NOTE — Progress Notes (Signed)
Per lab, QNS for light green tube. Phlebotomy called for recollect

## 2022-07-20 NOTE — Progress Notes (Signed)
Physical Therapy Treatment Patient Details Name: Stephanie Benton MRN: [id number] DOB: 12/19/1950 Today's Date: 07/20/2022   History of Present Illness Stephanie Benton is a 71 y.o. female with PMH significant for HTN, HLD, chronic abdominal pain, anxiety, bipolar disorder who had cholecystectomy 2 months ago.  Recently admitted 12/3-12/9 for acute on chronic abdominal pain felt to be due to acute on chronic pancreatitis. Found to be  anemic, EGD  showed esophagitis.  12/11, patient was brought to the ED from home by EMS for altered mental status, Per history, she slid off her bed 12/10 and remained on the floor for several hours when her family found her and called EMS.    PT Comments    Pt awake and able to state her name, otherwise non-communicatory and only grimaces and cries out with occasional head nods/shakes in response to questions. Pt required total assist +2 for bed mobility and sat EOB for ~60min, pt required support from [facility] via shoulder to maintain upright sitting balance. Returned pt to supine and completed some PROM to BLE as well as manual therapy to R hand to reduce swelling, pt unable to make a fist or participate, though did fire her R quads a little. Pt is not progressing her mobility. We will continue to follow acutely.    Recommendations for follow up therapy are one component of a multi-disciplinary discharge planning process, led by the attending physician.  Recommendations may be updated based on patient status, additional functional criteria and insurance authorization.  Follow Up Recommendations  Skilled nursing-short term rehab (<3 hours/day) Can patient physically be transported by private vehicle: No   Assistance Recommended at Discharge Frequent or constant Supervision/Assistance  Patient can return home with the following Two people to help with walking and/or transfers;Two people to help with bathing/dressing/bathroom;Assist for transportation   Equipment  Recommendations  None recommended by PT    Recommendations for Other Services OT consult     Precautions / Restrictions Precautions Precautions: Fall Restrictions Weight Bearing Restrictions: No     Mobility  Bed Mobility Overal bed mobility: Needs Assistance Bed Mobility: Supine to Sit     Supine to sit: HOB elevated, Total assist, +2 for physical assistance, +2 for safety/equipment Sit to supine: HOB elevated, Total assist, +2 for physical assistance, +2 for safety/equipment   General bed mobility comments: Total assist +2 for bed mobility. Pt sat EOB for . Returned to supine    Transfers                        Ambulation/Gait                   Stairs             Wheelchair Mobility    Modified Rankin (Stroke Patients Only)       Balance Overall balance assessment: Needs assistance Sitting-balance support: Feet unsupported, Bilateral upper extremity supported Sitting balance-Leahy Scale: Poor Sitting balance - Comments: Initially pt required mod assist to maintain sitting balance, over time this decreased but Pt leaning against HOB via shoulder                                    Cognition Arousal/Alertness: Awake/alert Behavior During Therapy: Flat affect Overall Cognitive Status: Impaired/Different from baseline Area of Impairment: Orientation, Attention, Following commands, Awareness  Orientation Level: Place, Time, Situation Current Attention Level: Focused   Following Commands: Follows one step commands inconsistently   Awareness: Emergent   General Comments: patient with impaired speach and unable to  clearly state where her pain is, just noted by responses, withdrawal        Exercises Other Exercises Other Exercises: Light manual therapy to R hand to reduce edema distal to proximal Other Exercises: PROM heel slides to reduce stiffness x5, pt with very light amount of muscular  firing with quad set, grimacing throughout.    General Comments        Pertinent Vitals/Pain Pain Assessment Pain Assessment: Faces Faces Pain Scale: Hurts little more Breathing: normal Negative Vocalization: none Facial Expression: smiling or inexpressive Body Language: relaxed Consolability: no need to console PAINAD Score: 0 Pain Location: right foot, left knee  left shoulder Pain Descriptors / Indicators: Discomfort, Grimacing, Guarding, Moaning Pain Intervention(s): Limited activity within patient's tolerance, Monitored during session, Repositioned    Home Living                          Prior Function            PT Goals (current goals can now be found in the care plan section) Acute Rehab PT Goals Patient Stated Goal: return home PT Goal Formulation: Patient unable to participate in goal setting Time For Goal Achievement: 07/31/22 Potential to Achieve Goals: Fair Progress towards PT goals: Not progressing toward goals - comment (Unable to follow commands, non-participatory)    Frequency    Min 2X/week      PT Plan Current plan remains appropriate    Co-evaluation              AM-PAC PT "6 Clicks" Mobility   Outcome Measure  Help needed turning from your back to your side while in a flat bed without using bedrails?: Total Help needed moving from lying on your back to sitting on the side of a flat bed without using bedrails?: Total Help needed moving to and from a bed to a chair (including a wheelchair)?: Total Help needed standing up from a chair using your arms (e.g., wheelchair or bedside chair)?: Total Help needed to walk in hospital room?: Total Help needed climbing 3-5 steps with a railing? : Total 6 Click Score: 6    End of Session   Activity Tolerance: Patient limited by pain;Patient limited by lethargy;Patient limited by fatigue Patient left: in bed;with call bell/phone within reach;with bed alarm set;with SCD's  reapplied Nurse Communication: Mobility status;Need for lift equipment PT Visit Diagnosis: History of falling (Z91.81);Muscle weakness (generalized) (M62.81);Difficulty in walking, not elsewhere classified (R26.2)     Time: 6294-7654 PT Time Calculation (min) (ACUTE ONLY): 19 min  Charges:  $Therapeutic Activity: 8-22 mins                     Jamesetta Geralds, PT, DPT [facility] Department Office: [phone] Weekend pager: [phone]   Jamesetta Geralds 07/20/2022, 1:41 PM

## 2022-07-21 DIAGNOSIS — T68XXXA Hypothermia, initial encounter: Secondary | ICD-10-CM | POA: Diagnosis not present

## 2022-07-21 LAB — CBC WITH DIFFERENTIAL/PLATELET
Abs Immature Granulocytes: 0.51 10*3/uL — ABNORMAL HIGH (ref 0.00–0.07)
Basophils Absolute: 0.1 10*3/uL (ref 0.0–0.1)
Basophils Relative: 1 %
Eosinophils Absolute: 0.2 10*3/uL (ref 0.0–0.5)
Eosinophils Relative: 2 %
HCT: 26 % — ABNORMAL LOW (ref 36.0–46.0)
Hemoglobin: 8.2 g/dL — ABNORMAL LOW (ref 12.0–15.0)
Immature Granulocytes: 5 %
Lymphocytes Relative: 12 %
Lymphs Abs: 1.3 10*3/uL (ref 0.7–4.0)
MCH: 26.7 pg (ref 26.0–34.0)
MCHC: 31.5 g/dL (ref 30.0–36.0)
MCV: 84.7 fL (ref 80.0–100.0)
Monocytes Absolute: 1.3 10*3/uL — ABNORMAL HIGH (ref 0.1–1.0)
Monocytes Relative: 12 %
Neutro Abs: 7.5 10*3/uL (ref 1.7–7.7)
Neutrophils Relative %: 68 %
Platelets: 261 10*3/uL (ref 150–400)
RBC: 3.07 MIL/uL — ABNORMAL LOW (ref 3.87–5.11)
RDW: 19.2 % — ABNORMAL HIGH (ref 11.5–15.5)
WBC: 10.9 10*3/uL — ABNORMAL HIGH (ref 4.0–10.5)
nRBC: 0.8 % — ABNORMAL HIGH (ref 0.0–0.2)

## 2022-07-21 LAB — COMPREHENSIVE METABOLIC PANEL
ALT: 53 U/L — ABNORMAL HIGH (ref 0–44)
AST: 59 U/L — ABNORMAL HIGH (ref 15–41)
Albumin: 2.3 g/dL — ABNORMAL LOW (ref 3.5–5.0)
Alkaline Phosphatase: 78 U/L (ref 38–126)
Anion gap: 6 (ref 5–15)
BUN: 11 mg/dL (ref 8–23)
CO2: 20 mmol/L — ABNORMAL LOW (ref 22–32)
Calcium: 8.7 mg/dL — ABNORMAL LOW (ref 8.9–10.3)
Chloride: 117 mmol/L — ABNORMAL HIGH (ref 98–111)
Creatinine, Ser: 0.76 mg/dL (ref 0.44–1.00)
GFR, Estimated: >60 mL/min (ref >60)
Glucose, Bld: 93 mg/dL (ref 70–99)
Potassium: 3.2 mmol/L — ABNORMAL LOW (ref 3.5–5.1)
Sodium: 143 mmol/L (ref 135–145)
Total Bilirubin: 0.7 mg/dL (ref 0.3–1.2)
Total Protein: 5.1 g/dL — ABNORMAL LOW (ref 6.5–8.1)

## 2022-07-21 LAB — CULTURE, BLOOD (ROUTINE X 2)
Culture: NO GROWTH
Culture: NO GROWTH
Culture: NO GROWTH
Special Requests: ADEQUATE
Special Requests: ADEQUATE

## 2022-07-21 LAB — CK: Total CK: 252 U/L — ABNORMAL HIGH (ref 38–234)

## 2022-07-21 MED ORDER — SODIUM CHLORIDE 0.9 % IV SOLN: INTRAVENOUS | Status: AC | PRN

## 2022-07-21 MED ORDER — CARVEDILOL 6.25 MG PO TABS
6.2500 mg | ORAL_TABLET | Freq: Two times a day (BID) | ORAL | Status: DC
  Administered 2022-07-21: 6.25 mg via ORAL
  Filled 2022-07-21: qty 1

## 2022-07-21 MED ORDER — POTASSIUM CHLORIDE 20 MEQ PO PACK
40.0000 meq | PACK | Freq: Once | ORAL | Status: AC
  Administered 2022-07-21: 40 meq via ORAL
  Filled 2022-07-21: qty 2

## 2022-07-21 MED ORDER — CARVEDILOL 6.25 MG PO TABS: 6.2500 mg | ORAL_TABLET | Freq: Two times a day (BID) | ORAL | Status: DC

## 2022-07-21 NOTE — Progress Notes (Signed)
PROGRESS NOTE  Stephanie Benton  DOB: 1951-01-10  PCP: [name], MD YTK:[id number]  DOA: 07/15/2022  LOS: 5 days  Hospital Day: 7  Brief narrative: Stephanie Benton is a 71 y.o. female with PMH significant for HTN, HLD, chronic abdominal pain, anxiety, bipolar disorder who had cholecystectomy 2 months ago.  Recently admitted 12/3-12/9 for acute on chronic abdominal pain felt to be due to acute on chronic pancreatitis.  She was also noted to be anemic and had an EGD done which showed esophagitis. 12/11, patient was brought to the ED from home by EMS for altered mental status.  Per history, she slid off her bed 12/10 and remained on the floor for several hours when her family found her and called EMS. On arrival to the ED, patient was very cool to touch and rectal temperature 92.2 F.  She was placed on [facility].  Labs showed WBC count 12.9, hemoglobin 7.9 (was 8.0 on labs done 4 days ago), bicarb 15, glucose 131, creatinine 1.9 (baseline 0.5-0.8),  AST 84, T. bili 1.6, ALT and alk phos normal, lipase 132,  CK 1905, lactic acid 7.6> 4.3. Blood cultures pending COVID and influenza PCR negative UA is not suggestive of infection INR 1.5, ammonia 37,  Chest x-ray did not suggest pneumonia.   CT abdomen pelvis showed fluid-filled colon with scattered air-fluid levels which could reflect a diarrheal illness.   CT head/C-spine and x-rays of right knee and bilateral hips/pelvis were negative for acute finding.  Patient was given vancomycin, meropenem, metronidazole, and 2 L LR boluses.   Admitted to [facility]  Subjective: Patient was seen and examined this morning. Propped up in bed.  Alert, awake, oriented to place.  Able to answer some questions and able to follow motor commands but very slow to respond.  Tachycardic consistently.  Blood pressure elevated as well this morning. Foley catheter with clear urine.  Assessment and plan: SIRS POA Sepsis ruled out Prior to admission, patient was  on the floor for several hours without access to water which probably is the cause of hypothermia, tachycardia, leukocytosis, lactic acidosis.  No history of any diarrhea, fever, urinary symptoms, cough, shortness of breath at home. Initial workup did not show any evidence of infection as supported by negative COVID and influenza PCR, unremarkable UA, unremarkable chest x-ray Patient was placed on B[employer] hugger with subsequent improvement in temperature Given broad-spectrum IV antibiotics in the ED  No growth on blood culture.  No source of infection identified.  Sepsis ruled out. Antibiotics stopped. Lactic acid improved with hydration Recent Labs  Lab 07/15/22 2243 07/16/22 0134 07/16/22 0515 07/17/22 0305 07/18/22 0318 07/19/22 0252 07/20/22 0317 07/21/22 0239  WBC 12.9*  --    < > 15.1* 13.8* 11.5* 12.2* 10.9*  LATICACIDVEN 7.6* 4.3*  --  1.5  --   --   --   --    < > = values in this interval not displayed.  Acute metabolic encephalopathy Likely secondary to to severe dehydration, rhabdomyolysis, electrolyte abnormalities CT head unremarkable.  Ammonia level not elevated.  No evidence of meningeal signs. Gradually improving mental status.  Able to follow commands today but still slow to respond   Rhabdomyolysis Patient was found down on the floor for greater than 24 hours.   CK trend as below.  Significantly improved.  IV fluids stopped. Recent Labs  Lab 07/15/22 2230 07/16/22 0515 07/17/22 0305 07/18/22 0318 07/19/22 0252 07/20/22 0550 07/21/22 0239  CKTOTAL 1,905* 2,971* 2,617* 2,396*  1,170* 402* 252*    AKI Baseline creatinine less than 1.  Presented with creatinine elevated 1.92, serum bicarb low at 15..  Likely prerenal in the setting of severe sepsis, dehydration and rhabdomyolysis.  CT scan without evidence of obstructive uropathy. Renal function level gradually improved with hydration. Recent Labs    07/07/22 2232 07/09/22 0513 07/10/22 0554 07/15/22 2243  07/16/22 0515 07/17/22 0305 07/18/22 0318 07/19/22 0252 07/20/22 0550 07/21/22 0239  BUN 6* 8 7* _0 CREATININE 0.80 0.75 0.55 1.97* 1.92* 1.64* 1.21* 0.85 0.74 0.76  CO2 19* 21* 17* 15* 21* 21* 20* 21* 20* 20*   Elevated lipase, AST, ammonia No history of liver disease.  Elevated AST and ALT likely because of rhabdomyolysis. CT abdomen pelvis without any hepatobiliary or pancreatic etiology. acute hepatitis panel nonreactive Liver enzymes improving, trend as below Recent Labs  Lab 07/15/22 2243 07/16/22 0134 07/16/22 0515 07/17/22 0305 07/18/22 0318 07/19/22 0252 07/20/22 0317 07/21/22 0239  AST 84*  --  149* 165* 149* 119*  --  59*  ALT 33  --  44 57* 59* 57*  --  53*  ALKPHOS 80  --  78 88 98 82  --  78  BILITOT 1.6*  --  1.6* 1.2 1.4* 0.8  --  0.7  PROT 5.2*  --  5.1* 4.6* 5.3* 4.9*  --  5.1*  ALBUMIN 2.3*  --  2.3* 2.1* 2.3* 2.2*  --  2.3*  AMMONIA  --  37*  --  17  --   --   --   --   INR  --  1.5*  --   --   --   --   --   --   LIPASE 132*  --   --   --   --   --   --   --   PLT 309  --  293 319 273 274 247 261   Hypertension Sinus tachycardia Heart rate and blood pressure both elevated this morning.  Add Coreg 6.25 mg twice daily.  Continue telmisartan 80 mg daily.Demadex on hold. Last echo from October 2023 with EF 60 to 65%, mild LVH, grade 1 diastolic dysfunction.   Questionable history of diarrhea CT abdomen pelvis showing fluid-filled colon with scattered air-fluid levels which could reflect a diarrheal illness.  Patient is unable to recall all the events prior to presentation. Per nursing staff, she has not had any diarrhea since she has been hospitalized.  Enema given today  Chronic normocytic anemia Hemoglobin has been running less than 10 since she was hospitalized in October.  Presented with hemoglobin of 7.9.  It dipped down to the lowest of 6.5 on 12/15.  1 unit of PRBC was transfused.  Hemoglobin up at 9.9 today.  Continue to  monitor Continue PPI, iron supplements, folic acid Recent Labs    07/09/22 1606 07/11/22 0553 07/18/22 0318 07/19/22 0252 07/19/22 1533 07/20/22 0317 07/21/22 0239  HGB  --    < > 7.6* 6.5* 8.7* 9.9* 8.2*  MCV  --    < > 83.8 84.1  --  82.5 84.7  VITAMINB12 1,104*  --   --   --   --   --   --   FOLATE 5.9*  --   --   --   --   --   --   FERRITIN 269  --   --   --   --   --   --  TIBC 160*  --   --   --   --   --   --   IRON 80  --   --   --   --   --   --    < > = values in this interval not displayed.   Hyperlipidemia Statin on hold due to elevated transaminases  Anxiety/depression Not on meds  Impaired mobility PT eval obtained.  SNF recommended  Goals of care   Code Status: DNR.  Last discussed with patient's son 12/16.  Made DNR/DNI.  Mobility: Needs PT eval  Infusions:     Scheduled Meds:  vitamin C  500 mg Oral Daily   carvedilol  6.25 mg Oral BID WC   Chlorhexidine Gluconate Cloth  6 each Topical Daily   feeding supplement  1 Container Oral TID BM   irbesartan  300 mg Oral Daily   multivitamin with minerals  1 tablet Oral Daily   Vitamin D (Ergocalciferol)  50,000 Units Oral Q7 days    PRN meds: HYDROmorphone (DILAUDID) injection, mouth rinse, polyethylene glycol   Skin assessment:     Nutritional status:  Body mass index is 30.05 kg/m.  Nutrition Problem: Moderate Malnutrition Etiology: chronic illness Signs/Symptoms: moderate muscle depletion, moderate fat depletion     Diet:  Diet Order             DIET DYS 3 Room service appropriate? Yes; Fluid consistency: Thin  Diet effective now                   DVT prophylaxis:  Place and maintain sequential compression device Start: 07/16/22 0507   Antimicrobials: Not on antibiotics Fluid: Not on IV fluid Consultants: None Family Communication: As above  Status is: Inpatient  Continue in-hospital care because: Physical remains weak.  Remains tachycardic, hypertensive.   Level of  care: Progressive   Dispo: The patient is from: Home              Anticipated d/c is to: Pending clinical course, likely discharge to SNF in 1 to 2 days              Patient currently is not medically stable to d/c.   Difficult to place patient No    Antimicrobials: Anti-infectives (From admission, onward)    Start     Dose/Rate Route Frequency Ordered Stop   07/18/22 0100  vancomycin (VANCOCIN) IVPB 1000 mg/200 mL premix  Status:  Discontinued        1,000 mg 200 mL/hr over 60 Minutes Intravenous Every 48 hours 07/16/22 0506 07/17/22 1055   07/18/22 0100  vancomycin (VANCOREADY) IVPB 1250 mg/250 mL  Status:  Discontinued        1,250 mg 166.7 mL/hr over 90 Minutes Intravenous Every 48 hours 07/17/22 1055 07/18/22 1016   07/16/22 1200  aztreonam (AZACTAM) 1 g in sodium chloride 0.9 % 100 mL IVPB  Status:  Discontinued        1 g 200 mL/hr over 30 Minutes Intravenous Every 8 hours 07/16/22 0506 07/18/22 1016   07/16/22 1100  metroNIDAZOLE (FLAGYL) IVPB 500 mg  Status:  Discontinued        500 mg 100 mL/hr over 60 Minutes Intravenous Every 12 hours 07/16/22 0447 07/18/22 1016   07/16/22 0500  aztreonam (AZACTAM) 2 g in sodium chloride 0.9 % 100 mL IVPB  Status:  Discontinued        2 g 200 mL/hr over 30 Minutes Intravenous  Once  07/16/22 0446 07/16/22 0451   07/16/22 0500  metroNIDAZOLE (FLAGYL) IVPB 500 mg  Status:  Discontinued        500 mg 100 mL/hr over 60 Minutes Intravenous Every 12 hours 07/16/22 0446 07/16/22 0447   07/16/22 0500  vancomycin (VANCOCIN) IVPB 1000 mg/200 mL premix  Status:  Discontinued        1,000 mg 200 mL/hr over 60 Minutes Intravenous  Once 07/16/22 0446 07/16/22 0447   07/15/22 2330  meropenem (MERREM) 1 g in sodium chloride 0.9 % 100 mL IVPB        1 g 200 mL/hr over 30 Minutes Intravenous  Once 07/15/22 2318 07/16/22 0107   07/15/22 2315  metroNIDAZOLE (FLAGYL) IVPB 500 mg        500 mg 100 mL/hr over 60 Minutes Intravenous  Once 07/15/22 2302  07/16/22 0107   07/15/22 2315  vancomycin (VANCOCIN) IVPB 1000 mg/200 mL premix  Status:  Discontinued        1,000 mg 200 mL/hr over 60 Minutes Intravenous  Once 07/15/22 2302 07/15/22 2305   07/15/22 2315  vancomycin (VANCOREADY) IVPB 2000 mg/400 mL        2,000 mg 200 mL/hr over 120 Minutes Intravenous  Once 07/15/22 2306 07/16/22 0314       Objective: Vitals:   07/21/22 0700 07/21/22 0800  BP: (!) 161/77 (!) 153/84  Pulse: (!) 109 (!) 116  Resp: 13 12  Temp: 99.1 F (37.3 C) 99.5 F (37.5 C)  SpO2: 100% 100%    Intake/Output Summary (Last 24 hours) at 07/21/2022 1337 Last data filed at 07/21/2022 0648 Gross per 24 hour  Intake --  Output 625 ml  Net -625 ml   Filed Weights   07/15/22 2123 07/16/22 1049  Weight: 93.4 kg 81.9 kg   Weight change:  Body mass index is 30.05 kg/m.   Physical Exam: General exam: Pleasant, elderly African-American female.  Not in pain Skin: No rashes, lesions or ulcers. HEENT: Atraumatic, normocephalic, no obvious bleeding Lungs: Clear to auscultation bilaterally CVS: Regular rhythm, tachycardic, no murmur  GI/Abd soft, nontender, nondistended, bowel sound present CNS: Alert, awake, patient knows she is in the hospital.  Able to follow simple motor commands.   Psychiatry: Sad affect Extremities: No pedal edema, no calf tenderness  Data Review: I have personally reviewed the laboratory data and studies available.  F/u labs ordered Unresulted Labs (From admission, onward)     Start     Ordered   07/21/22 0500  Comprehensive metabolic panel  Daily,   R     Question:  Specimen collection method  Answer:  Lab=Lab collect   07/20/22 1136   07/20/22 0500  CBC with Differential/Platelet  Daily,   R     Question:  Specimen collection method  Answer:  Lab=Lab collect   07/19/22 0751   07/16/22 0449  Gastrointestinal Panel by PCR , Stool  (Gastrointestinal Panel by PCR, Stool                                                                                                                                                      **  Does Not include CLOSTRIDIUM DIFFICILE testing. **If CDIFF testing is needed, place order from the "C Difficile Testing" order set.**)  Once,   R        07/16/22 0448            Signed, Terrilee Croak, MD [facility] 07/21/2022

## 2022-07-21 NOTE — TOC Progression Note (Addendum)
Transition of Care [facility]) - Progression Note    Patient Details  Name: Stephanie Benton MRN: [id number] Date of Birth: Jan 17, 1951  [facility] of [facility]) CM/SW Contact  Amada Jupiter, LCSW Phone Number: 07/21/2022, 11:02 AM  Clinical Narrative:    Alerted by MD that he anticipates pt may be medically ready for SNF transfer tomorrow.  Bed had already been accepted with [facility] and have alerted facility.  Insurance authorization begun.   ADDENDUM: Have received insurance auth:  Plan Auth ID: [id number] Navi ID:  [id number] Approved 07/22/2022-07/24/2022        Expected Discharge Plan and Services                                                 Social Determinants of Health (SDOH) Interventions    Readmission Risk Interventions    07/18/2022    8:43 AM 07/10/2022   10:49 AM 06/24/2022    9:42 AM  Readmission Risk Prevention Plan  Transportation Screening Complete Complete Complete  PCP or Specialist Appt within 3-5 Days   Complete  HRI or Home Care Consult   Complete  Social Work Consult for Recovery Care Planning/Counseling   Complete  Palliative Care Screening   Not Applicable  Medication Review [employer]) Complete Complete Complete  PCP or Specialist appointment within 3-5 days of discharge Complete Complete   HRI or Home Care Consult Complete Complete   SW Recovery Care/Counseling Consult Complete Complete   Palliative Care Screening Not Applicable Not Applicable   Skilled Nursing Facility Not Applicable

## 2022-07-22 LAB — CBC WITH DIFFERENTIAL/PLATELET
Abs Immature Granulocytes: 0.28 10*3/uL — ABNORMAL HIGH (ref 0.00–0.07)
Basophils Absolute: 0.1 10*3/uL (ref 0.0–0.1)
Basophils Relative: 1 %
Eosinophils Absolute: 0.3 10*3/uL (ref 0.0–0.5)
Eosinophils Relative: 3 %
HCT: 28 % — ABNORMAL LOW (ref 36.0–46.0)
Hemoglobin: 9.2 g/dL — ABNORMAL LOW (ref 12.0–15.0)
Immature Granulocytes: 3 %
Lymphocytes Relative: 13 %
Lymphs Abs: 1.1 10*3/uL (ref 0.7–4.0)
MCH: 27.1 pg (ref 26.0–34.0)
MCHC: 32.9 g/dL (ref 30.0–36.0)
MCV: 82.4 fL (ref 80.0–100.0)
Monocytes Absolute: 1 10*3/uL (ref 0.1–1.0)
Monocytes Relative: 12 %
Neutro Abs: 5.8 10*3/uL (ref 1.7–7.7)
Neutrophils Relative %: 68 %
Platelets: 246 10*3/uL (ref 150–400)
RBC: 3.4 MIL/uL — ABNORMAL LOW (ref 3.87–5.11)
RDW: 19.6 % — ABNORMAL HIGH (ref 11.5–15.5)
WBC: 8.6 10*3/uL (ref 4.0–10.5)
nRBC: 0.2 % (ref 0.0–0.2)

## 2022-07-22 LAB — COMPREHENSIVE METABOLIC PANEL
ALT: 52 U/L — ABNORMAL HIGH (ref 0–44)
AST: 48 U/L — ABNORMAL HIGH (ref 15–41)
Albumin: 2.2 g/dL — ABNORMAL LOW (ref 3.5–5.0)
Alkaline Phosphatase: 79 U/L (ref 38–126)
Anion gap: 8 (ref 5–15)
BUN: 9 mg/dL (ref 8–23)
CO2: 20 mmol/L — ABNORMAL LOW (ref 22–32)
Calcium: 8.6 mg/dL — ABNORMAL LOW (ref 8.9–10.3)
Chloride: 118 mmol/L — ABNORMAL HIGH (ref 98–111)
Creatinine, Ser: 0.66 mg/dL (ref 0.44–1.00)
GFR, Estimated: >60 mL/min (ref >60)
Glucose, Bld: 94 mg/dL (ref 70–99)
Potassium: 3.5 mmol/L (ref 3.5–5.1)
Sodium: 146 mmol/L — ABNORMAL HIGH (ref 135–145)
Total Bilirubin: 0.9 mg/dL (ref 0.3–1.2)
Total Protein: 5 g/dL — ABNORMAL LOW (ref 6.5–8.1)

## 2022-07-22 MED ORDER — ADULT MULTIVITAMIN W/MINERALS CH: 1.0000 | ORAL_TABLET | Freq: Every day | ORAL | Status: DC

## 2022-07-22 MED ORDER — ASCORBIC ACID 500 MG PO TABS: 500.0000 mg | ORAL_TABLET | Freq: Every day | ORAL | Status: DC

## 2022-07-22 MED ORDER — CARVEDILOL 12.5 MG PO TABS: 12.5000 mg | ORAL_TABLET | Freq: Two times a day (BID) | ORAL | Status: DC

## 2022-07-22 MED ORDER — VITAMIN D (ERGOCALCIFEROL) 1.25 MG (50000 UNIT) PO CAPS: 50000.0000 [IU] | ORAL_CAPSULE | ORAL | Status: AC

## 2022-07-22 MED ORDER — CARVEDILOL 12.5 MG PO TABS
12.5000 mg | ORAL_TABLET | Freq: Two times a day (BID) | ORAL | Status: DC
  Administered 2022-07-22: 12.5 mg via ORAL
  Filled 2022-07-22: qty 1

## 2022-07-22 NOTE — Discharge Summary (Addendum)
Physician Discharge Summary  LYLIANNA Benton JXB:[id number] DOB: 17-May-1951 DOA: 07/15/2022  PCP: [name], MD  Admit date: 07/15/2022 Discharge date: 07/22/2022  Admitted From: Home Discharge disposition: SNF  Brief narrative: Stephanie Benton is a 71 y.o. female with PMH significant for HTN, HLD, chronic abdominal pain, anxiety, bipolar disorder who had cholecystectomy 2 months ago.  Recently admitted 12/3-12/9 for acute on chronic abdominal pain felt to be due to acute on chronic pancreatitis.  She was also noted to be anemic and had an EGD done which showed esophagitis. 12/11, patient was brought to the ED from home by EMS for altered mental status.  Per history, she slid off her bed 12/10 and remained on the floor for several hours when her family found her and called EMS. On arrival to the ED, patient was very cool to touch and rectal temperature 92.2 F.  She was placed on [facility].  Labs showed WBC count 12.9, hemoglobin 7.9 (was 8.0 on labs done 4 days ago), bicarb 15, glucose 131, creatinine 1.9 (baseline 0.5-0.8),  AST 84, T. bili 1.6, ALT and alk phos normal, lipase 132,  CK 1905, lactic acid 7.6> 4.3. Blood cultures pending COVID and influenza PCR negative UA is not suggestive of infection INR 1.5, ammonia 37,  Chest x-ray did not suggest pneumonia.   CT abdomen pelvis showed fluid-filled colon with scattered air-fluid levels which could reflect a diarrheal illness.   CT head/C-spine and x-rays of right knee and bilateral hips/pelvis were negative for acute finding.  Patient was given vancomycin, meropenem, metronidazole, and 2 L LR boluses.   Admitted to [facility] See below for details  Subjective: Patient was seen and examined this morning. Propped up in bed.  Alert, awake, oriented to place.  Able to answer some questions and able to follow motor commands but very slow to respond.  Tachycardia and blood pressure improving.  Foley catheter was taken out yesterday.   Able to void.  Assessment and plan: SIRS POA Sepsis ruled out Prior to admission, patient was on the floor for several hours without access to water which probably is the cause of hypothermia, tachycardia, leukocytosis, lactic acidosis.  No history of any diarrhea, fever, urinary symptoms, cough, shortness of breath at home. Initial workup did not show any evidence of infection as supported by negative COVID and influenza PCR, unremarkable UA, unremarkable chest x-ray Patient was placed on Bair hugger with subsequent improvement in temperature Patient was initially given broad-spectrum IV antibiotics in the ED  Blood culture did not show any growth no source of infection identified.  Sepsis ruled out. Antibiotics was stopped after 3 days.. Lactic acid improved with hydration Recent Labs  Lab 07/15/22 2243 07/16/22 0134 07/16/22 0515 07/17/22 0305 07/18/22 0318 07/19/22 0252 07/20/22 0317 07/21/22 0239 07/22/22 0317  WBC 12.9*  --    < > 15.1* 13.8* 11.5* 12.2* 10.9* 8.6  LATICACIDVEN 7.6* 4.3*  --  1.5  --   --   --   --   --    < > = values in this interval not displayed.  Acute metabolic encephalopathy Likely secondary to to severe dehydration, rhabdomyolysis, electrolyte abnormalities CT head unremarkable.  Ammonia level not elevated.  No evidence of meningeal signs. Gradually improving mental status.  Able to follow commands today but still slow to respond Resume Cymbalta at discharge   Rhabdomyolysis Patient was found down on the floor for greater than 24 hours.   CK trend as below.  Significantly  improved.  IV fluids stopped. Recent Labs  Lab 07/15/22 2230 07/16/22 0515 07/17/22 0305 07/18/22 0318 07/19/22 0252 07/20/22 0550 07/21/22 0239  CKTOTAL 1,905* 2,971* 2,617* 2,396* 1,170* 402* 252*    AKI Baseline creatinine less than 1.  Presented with creatinine elevated 1.92, serum bicarb low at 15..  Likely prerenal in the setting of severe sepsis, dehydration and  rhabdomyolysis.  CT scan without evidence of obstructive uropathy. Renal function level gradually improved with hydration. Recent Labs    07/09/22 0513 07/10/22 0554 07/15/22 2243 07/16/22 0515 07/17/22 0305 07/18/22 0318 07/19/22 0252 07/20/22 0550 07/21/22 0239 07/22/22 0317  BUN 8 7* _0 CREATININE 0.75 0.55 1.97* 1.92* 1.64* 1.21* 0.85 0.74 0.76 0.66  CO2 21* 17* 15* 21* 21* 20* 21* 20* 20* 20*   Elevated lipase, AST, ammonia No history of liver disease.  Elevated AST and ALT likely because of rhabdomyolysis. CT abdomen pelvis without any hepatobiliary or pancreatic etiology. acute hepatitis panel nonreactive Liver enzymes improving, trend as below Recent Labs  Lab 07/15/22 2243 07/16/22 0134 07/16/22 0515 07/17/22 0305 07/18/22 0318 07/19/22 0252 07/20/22 0317 07/21/22 0239 07/22/22 0317  AST 84*  --    < > 165* 149* 119*  --  59* 48*  ALT 33  --    < > 57* 59* 57*  --  53* 52*  ALKPHOS 80  --    < > 88 98 82  --  78 79  BILITOT 1.6*  --    < > 1.2 1.4* 0.8  --  0.7 0.9  PROT 5.2*  --    < > 4.6* 5.3* 4.9*  --  5.1* 5.0*  ALBUMIN 2.3*  --    < > 2.1* 2.3* 2.2*  --  2.3* 2.2*  AMMONIA  --  37*  --  17  --   --   --   --   --   INR  --  1.5*  --   --   --   --   --   --   --   LIPASE 132*  --   --   --   --   --   --   --   --   PLT 309  --    < > 319 273 274 247 261 246   < > = values in this interval not displayed.   Hypertension Sinus tachycardia Heart rate and blood pressure are both improving after she was started on Coreg.  At discharge, I would continue Coreg 12.5 mg twice daily.  She will also continue her home regimen of telmisartan 80 mg daily and as needed Demadex. Last echo from October 2023 with EF 60 to 65%, mild LVH, grade 1 diastolic dysfunction.   Chronic normocytic anemia Hemoglobin has been running less than 10 since she was hospitalized in October.  Presented with hemoglobin of 7.9.  It dipped down to the lowest of 6.5 on  12/15.  1 unit of PRBC was transfused.  Hemoglobin stabilized close to 9 after that. Continue PPI, iron supplements, folic acid Recent Labs    07/09/22 1606 07/11/22 0553 07/19/22 0252 07/19/22 1533 07/20/22 0317 07/21/22 0239 07/22/22 0317  HGB  --    < > 6.5* 8.7* 9.9* 8.2* 9.2*  MCV  --    < > 84.1  --  82.5 84.7 82.4  VITAMINB12 1,104*  --   --   --   --   --   --  FOLATE 5.9*  --   --   --   --   --   --   FERRITIN 269  --   --   --   --   --   --   TIBC 160*  --   --   --   --   --   --   IRON 80  --   --   --   --   --   --    < > = values in this interval not displayed.   Hyperlipidemia Statin on hold due to elevated transaminases.  I will not resume it at discharge.  Anxiety/depression Not on meds  Impaired mobility Patient has significantly limited mobility.  She needs a lot of encouragement to move.  PT eval obtained.  SNF recommended  Goals of care   Code Status: DNR.  Last discussed with patient's son 12/16.  Made DNR/DNI.  Wounds:  - Incision (Closed) 05/22/22 Abdomen Other (Comment) (Active)  Date First Assessed/Time First Assessed: 05/22/22 1239   Location: Abdomen  Location Orientation: Other (Comment)    Assessments 05/22/2022 12:58 PM 05/23/2022  7:10 AM  Dressing Type Liquid skin adhesive Liquid skin adhesive  Dressing Clean, Dry, Intact Clean, Dry, Intact  Site / Wound Assessment Clean;Dry Clean;Dry  Margins Attached edges (approximated) Attached edges (approximated)  Closure Approximated;Skin glue Approximated;Skin glue  Drainage Amount None None     No associated orders.     Incision - 4 Ports Abdomen 1: Umbilicus 2: Mid;Upper 3: Right;Lower 4: Right;Upper (Active)  Placement Date/Time: 05/22/22 1245   Location of Ports: Abdomen  Port: 1:  Location Orientation: Umbilicus  Port: 2:  Location Orientation: Mid;Upper  Port: 3:  Location Orientation: Right;Lower  Port: 4:  Location Orientation: Right;Upper    No assessment data to display     No  associated orders.     Wound / Incision (Open or Dehisced) 07/17/22 Non-pressure wound Knee Anterior;Right abrasion (Active)  Date First Assessed/Time First Assessed: 07/17/22 0522   Wound Type: Non-pressure wound  Location: Knee  Location Orientation: Anterior;Right  Wound Description (Comments): abrasion  Present on Admission: Yes    Assessments 07/17/2022  5:22 AM 07/22/2022  8:00 AM  Dressing Type Foam - Lift dressing to assess site every shift Foam - Lift dressing to assess site every shift  Dressing Status Clean, Dry, Intact Clean, Dry, Intact  Dressing Change Frequency Monday, Wednesday, Friday PRN  Site / Wound Assessment Clean;Dry Pink;Yellow;Friable  % Wound base Red or Granulating 0% --  % Wound base Yellow/Fibrinous Exudate 90% --  % Wound base Black/Eschar 0% --  % Wound base Other/Granulation Tissue (Comment) 0% --  Peri-wound Assessment Intact --  Drainage Amount None None  Non-staged Wound Description Partial thickness --     No associated orders.    Discharge Exam:   Vitals:   07/22/22 0600 07/22/22 0646 07/22/22 0700 07/22/22 0800  BP: (!) 182/166 (!) 156/83 (!) 156/76 (!) 176/93  Pulse: (!) 108 (!) 104 (!) 103 (!) 105  Resp: _0 Temp:    98.3 F (36.8 C)  TempSrc:    Axillary  SpO2: 99% 96% 99% 99%  Weight:      Height:        Body mass index is 30.05 kg/m.  General exam: Pleasant, elderly African-American female.  Not in pain Skin: No rashes, lesions or ulcers. HEENT: Atraumatic, normocephalic, no obvious bleeding Lungs: Clear to auscultation bilaterally CVS: Regular  rhythm, tachycardic, no murmur  GI/Abd soft, nontender, nondistended, bowel sound present CNS: Alert, awake, patient knows she is in the hospital.  Able to follow simple motor commands.  But very slow motor movements Psychiatry: Sad affect Extremities: No pedal edema, no calf tenderness  Follow ups:    Follow-up Information     Paliwal, Himanshu, MD Follow up.   Specialty:  Family Medicine Contact information: [place] [address] [phone]                 Discharge Instructions:   Discharge Instructions     Call MD for:  difficulty breathing, headache or visual disturbances   Complete by: As directed    Call MD for:  extreme fatigue   Complete by: As directed    Call MD for:  hives   Complete by: As directed    Call MD for:  persistant dizziness or light-headedness   Complete by: As directed    Call MD for:  persistant nausea and vomiting   Complete by: As directed    Call MD for:  severe uncontrolled pain   Complete by: As directed    Call MD for:  temperature >100.4   Complete by: As directed    Diet general   Complete by: As directed    Dysphagia 3 diet   Discharge instructions   Complete by: As directed    General discharge instructions: Follow with Primary MD [name], MD in 7 days  Please request your PCP  to go over your hospital tests, procedures, radiology results at the follow up. Please get your medicines reviewed and adjusted.  Your PCP may decide to repeat certain labs or tests as needed. Do not drive, operate heavy machinery, perform activities at heights, swimming or participation in water activities or provide baby sitting services if your were admitted for syncope or siezures until you have seen by Primary MD or a Neurologist and advised to do so again. [place] Controlled Substance Reporting System database was reviewed. Do not drive, operate heavy machinery, perform activities at heights, swim, participate in water activities or provide baby-sitting services while on medications for pain, sleep and mood until your outpatient physician has reevaluated you and advised to do so again.  You are strongly recommended to comply with the dose, frequency and duration of prescribed medications. Activity: As tolerated with Full fall precautions use walker/cane & assistance as needed Avoid using  any recreational substances like cigarette, tobacco, alcohol, or non-prescribed drug. If you experience worsening of your admission symptoms, develop shortness of breath, life threatening emergency, suicidal or homicidal thoughts you must seek medical attention immediately by calling 911 or calling your MD immediately  if symptoms less severe. You must read complete instructions/literature along with all the possible adverse reactions/side effects for all the medicines you take and that have been prescribed to you. Take any new medicine only after you have completely understood and accepted all the possible adverse reactions/side effects.  Wear Seat belts while driving. You were cared for by a hospitalist during your hospital stay. If you have any questions about your discharge medications or the care you received while you were in the hospital after you are discharged, you can call the unit and ask to speak with the hospitalist or the covering physician. Once you are discharged, your primary care physician will handle any further medical issues. Please note that NO REFILLS for any discharge medications will be authorized once you are discharged,  as it is imperative that you return to your primary care physician (or establish a relationship with a primary care physician if you do not have one).   Discharge wound care:   Complete by: As directed    Increase activity slowly   Complete by: As directed        Discharge Medications:   Allergies as of 07/22/2022       Reactions   Fish-derived Products Anaphylaxis   Morphine And Related Anaphylaxis, Swelling   Belsomra [suvorexant] Other (See Comments)   Caused nightmares   Codeine Nausea And Vomiting   Fentanyl Hives   Ibuprofen Nausea Only, Other (See Comments)   Severe nausea- history of stomach ulcers   Nsaids Other (See Comments)   History of stomach ulcers   Other Other (See Comments)   History of stomach ulcers   Penicillins Hives    Did it involve swelling of the face/tongue/throat, SOB, or low BP? Yes Did it involve sudden or severe rash/hives, skin peeling, or any reaction on the inside of your mouth or nose? Yes Did you need to seek medical attention at a hospital or doctor's office? Yes When did it last happen? More than 10 years ago If all above answers are "NO", may proceed with cephalosporin use.   Sulfa Antibiotics Swelling   Site of swelling not recalled   Tolmetin Other (See Comments)   History of stomach ulcers        Medication List     STOP taking these medications    dexlansoprazole 60 MG capsule Commonly known as: DEXILANT   diclofenac sodium 1 % Gel Commonly known as: VOLTAREN   pregabalin 100 MG capsule Commonly known as: LYRICA   risperiDONE 1 MG tablet Commonly known as: RISPERDAL   sertraline 100 MG tablet Commonly known as: ZOLOFT   simvastatin 40 MG tablet Commonly known as: ZOCOR   sucralfate 1 GM/10ML suspension Commonly known as: Carafate   traMADol 50 MG tablet Commonly known as: ULTRAM       TAKE these medications    albuterol (2.5 MG/3ML) 0.083% nebulizer solution Commonly known as: PROVENTIL Take 3 mLs (2.5 mg total) by nebulization every 6 (six) hours as needed for wheezing or shortness of breath. What changed: Another medication with the same name was removed. Continue taking this medication, and follow the directions you see here.   ascorbic acid 500 MG tablet Commonly known as: VITAMIN C Take 1 tablet (500 mg total) by mouth daily. Start taking on: July 23, 2022   carvedilol 12.5 MG tablet Commonly known as: COREG Take 1 tablet (12.5 mg total) by mouth 2 (two) times daily with a meal.   dorzolamide 2 % ophthalmic solution Commonly known as: TRUSOPT Place 1 drop into both eyes 2 (two) times daily.   DULoxetine HCl 40 MG Cpep Take 40 mg by mouth daily.   feeding supplement Liqd Take 237 mLs by mouth 2 (two) times daily between meals.    FeroSul 325 (65 FE) MG tablet Generic drug: ferrous sulfate Take 325 mg by mouth daily with breakfast.   folic acid 1 MG tablet Commonly known as: FOLVITE Take 1 tablet (1 mg total) by mouth daily.   latanoprost 0.005 % ophthalmic solution Commonly known as: XALATAN Place 1 drop into both eyes at bedtime.   multivitamin with minerals Tabs tablet Take 1 tablet by mouth daily. Start taking on: July 23, 2022   ondansetron 4 MG disintegrating tablet Commonly known as: ZOFRAN-ODT Take 4  mg by mouth every 8 (eight) hours as needed for nausea or vomiting.   ondansetron 4 MG tablet Commonly known as: ZOFRAN Take 1 tablet (4 mg total) by mouth every 8 (eight) hours as needed for up to 14 days for nausea or vomiting.   pantoprazole 40 MG tablet Commonly known as: PROTONIX Take 40 mg by mouth 2 (two) times daily before a meal.   polyethylene glycol powder 17 GM/SCOOP powder Commonly known as: GLYCOLAX/MIRALAX Take 1 cap full (17 g) with water by mouth daily.   Symbicort 160-4.5 MCG/ACT inhaler Generic drug: budesonide-formoterol Inhale 1 puff into the lungs 2 (two) times daily as needed (for flares).   telmisartan 80 MG tablet Commonly known as: MICARDIS Take 80 mg by mouth at bedtime.   tolterodine 2 MG 24 hr capsule Commonly known as: DETROL LA Take 2 mg by mouth daily.   torsemide 10 MG tablet Commonly known as: DEMADEX Take 10 mg by mouth daily as needed (for swelling in legs).   Vitamin D (Ergocalciferol) 1.25 MG (50000 UNIT) Caps capsule Commonly known as: DRISDOL Take 1 capsule (50,000 Units total) by mouth every 7 (seven) days. Start taking on: July 24, 2022               Discharge Care Instructions  (From admission, onward)           Start     Ordered   07/22/22 0000  Discharge wound care:        07/22/22 1149             The results of significant diagnostics from this hospitalization (including imaging, microbiology, ancillary  and laboratory) are listed below for reference.    Procedures and Diagnostic Studies:   DG Hips Bilat W or Wo Pelvis 3-4 Views  Result Date: 07/15/2022 CLINICAL DATA:  Recent fall out of bed with leg pain, initial encounter EXAM: DG HIP (WITH OR WITHOUT PELVIS) 4V BILAT COMPARISON:  None Available. FINDINGS: Pelvic ring is intact. Degenerative changes of the hip joints are noted bilaterally. No acute fracture or dislocation is noted. No soft tissue abnormality is seen. IMPRESSION: Degenerative changes without acute abnormality. Electronically Signed   By: Inez Catalina M.D.   On: 07/15/2022 23:43   DG Knee Complete 4 Views Right  Result Date: 07/15/2022 CLINICAL DATA:  Recent fall with leg pain, initial encounter EXAM: RIGHT KNEE - COMPLETE 4+ VIEW COMPARISON:  None Available. FINDINGS: Degenerative changes are noted most prominent in the patellofemoral and medial joint spaces. No acute fracture or dislocation is noted. No joint effusion is seen. IMPRESSION: Degenerative change without acute abnormality. Electronically Signed   By: Inez Catalina M.D.   On: 07/15/2022 23:41   DG Chest 2 View  Result Date: 07/15/2022 CLINICAL DATA:  Recent fall from bed 1 day ago with chest pain, initial encounter EXAM: CHEST - 2 VIEW COMPARISON:  06/30/2022 FINDINGS: Cardiac shadow is within normal limits. Hiatal hernia is seen. Lungs are well aerated bilaterally. No focal infiltrate or sizable effusion is seen. No bony abnormality is noted. IMPRESSION: No active cardiopulmonary disease. Electronically Signed   By: Inez Catalina M.D.   On: 07/15/2022 23:40   CT ABDOMEN PELVIS WO CONTRAST  Result Date: 07/15/2022 CLINICAL DATA:  Abdominal pain, postop.  Found on floor. EXAM: CT ABDOMEN AND PELVIS WITHOUT CONTRAST TECHNIQUE: Multidetector CT imaging of the abdomen and pelvis was performed following the standard protocol without IV contrast. RADIATION DOSE REDUCTION: This exam was performed according  to the  departmental dose-optimization program which includes automated exposure control, adjustment of the mA and/or kV according to patient size and/or use of iterative reconstruction technique. COMPARISON:  07/08/2022 FINDINGS: Lower chest: Moderate-sized hiatal hernia, stable. No effusions or acute findings. Hepatobiliary: No focal liver abnormality is seen. Status post cholecystectomy. No biliary dilatation. Pancreas: No focal abnormality or ductal dilatation. Spleen: No focal abnormality.  Normal size. Adrenals/Urinary Tract: No adrenal abnormality. No focal renal abnormality. No stones or hydronephrosis. Urinary bladder is unremarkable. Stomach/Bowel: Stomach and small bowel decompressed. Colon is fluid-filled with scattered air-fluid levels. This could reflect diarrheal illness. No evidence of bowel obstruction. Vascular/Lymphatic: Aortic atherosclerosis. No evidence of aneurysm or adenopathy. Reproductive: Prior hysterectomy.  No adnexal masses. Other: No free fluid or free air. Small umbilical hernia containing fat, stable. Stable chronic ovoid fluid collection along the anterior abdominal wall above the umbilicus measuring 6.4 x 4.3 cm associated with ventral mesh hernia repair. Calcified structure in the overlying anterior subcutaneous soft tissues measures 2.4 cm, stable. Musculoskeletal: No acute bony abnormality. IMPRESSION: Stable moderate-sized hiatal hernia. Colon is fluid-filled with scattered air-fluid levels. This could reflect a diarrheal illness. No evidence of bowel obstruction. Stable anterior chronic fluid collection along the anterior mesh from prior ventral hernia repair. Aortic atherosclerosis. Electronically Signed   By: Rolm Baptise M.D.   On: 07/15/2022 23:20   CT Cervical Spine Wo Contrast  Result Date: 07/15/2022 CLINICAL DATA:  Neck trauma (Age >= 65y).  Found and EXAM: CT CERVICAL SPINE WITHOUT CONTRAST TECHNIQUE: Multidetector CT imaging of the cervical spine was performed without  intravenous contrast. Multiplanar CT image reconstructions were also generated. RADIATION DOSE REDUCTION: This exam was performed according to the departmental dose-optimization program which includes automated exposure control, adjustment of the mA and/or kV according to patient size and/or use of iterative reconstruction technique. COMPARISON:  None Available. FINDINGS: Alignment: Normal Skull base and vertebrae: No acute fracture. No primary bone lesion or focal pathologic process. Soft tissues and spinal canal: No prevertebral fluid or swelling. No visible canal hematoma. Disc levels: Maintained. Early anterior spurring. Mild bilateral degenerative facet disease. Upper chest: No acute findings Other: None IMPRESSION: No acute bony abnormality. Electronically Signed   By: Rolm Baptise M.D.   On: 07/15/2022 23:13   CT Head Wo Contrast  Result Date: 07/15/2022 CLINICAL DATA:  Mental status change, unknown cause found down EXAM: CT HEAD WITHOUT CONTRAST TECHNIQUE: Contiguous axial images were obtained from the base of the skull through the vertex without intravenous contrast. RADIATION DOSE REDUCTION: This exam was performed according to the departmental dose-optimization program which includes automated exposure control, adjustment of the mA and/or kV according to patient size and/or use of iterative reconstruction technique. COMPARISON:  None Available. FINDINGS: Brain: There is atrophy and chronic small vessel disease changes. No acute intracranial abnormality. Specifically, no hemorrhage, hydrocephalus, mass lesion, acute infarction, or significant intracranial injury. Vascular: No hyperdense vessel or unexpected calcification. Skull: No acute calvarial abnormality. Sinuses/Orbits: No acute findings Other: None IMPRESSION: Atrophy, chronic microvascular disease. No acute intracranial abnormality. Electronically Signed   By: Rolm Baptise M.D.   On: 07/15/2022 23:10     Labs:   Basic Metabolic  Panel: Recent Labs  Lab 07/18/22 0318 07/19/22 0252 07/20/22 0550 07/21/22 0239 07/22/22 0317  NA 141 149* 143 143 146*  K 3.9 2.6* 2.9* 3.2* 3.5  CL 112* 117* 117* 117* 118*  CO2 20* 21* 20* 20* 20*  GLUCOSE 86 79 116* 93 94  BUN  _0 CREATININE 1.21* 0.85 0.74 0.76 0.66  CALCIUM 8.7* 8.8* 8.7* 8.7* 8.6*   GFR Estimated Creatinine Clearance: 67.2 mL/min (by C-G formula based on SCr of 0.66 mg/dL). Liver Function Tests: Recent Labs  Lab 07/17/22 0305 07/18/22 0318 07/19/22 0252 07/21/22 0239 07/22/22 0317  AST 165* 149* 119* 59* 48*  ALT 57* 59* 57* 53* 52*  ALKPHOS 88 98 82 78 79  BILITOT 1.2 1.4* 0.8 0.7 0.9  PROT 4.6* 5.3* 4.9* 5.1* 5.0*  ALBUMIN 2.1* 2.3* 2.2* 2.3* 2.2*   Recent Labs  Lab 07/15/22 2243  LIPASE 132*   Recent Labs  Lab 07/16/22 0134 07/17/22 0305  AMMONIA 37* 17   Coagulation profile Recent Labs  Lab 07/16/22 0134  INR 1.5*    CBC: Recent Labs  Lab 07/18/22 0318 07/19/22 0252 07/19/22 1533 07/20/22 0317 07/21/22 0239 07/22/22 0317  WBC 13.8* 11.5*  --  12.2* 10.9* 8.6  NEUTROABS 11.4* 8.7*  --  10.1* 7.5 5.8  HGB 7.6* 6.5* 8.7* 9.9* 8.2* 9.2*  HCT 23.8* 20.6* 26.4* 30.6* 26.0* 28.0*  MCV 83.8 84.1  --  82.5 84.7 82.4  PLT 273 274  --  247 261 246   Cardiac Enzymes: Recent Labs  Lab 07/17/22 0305 07/18/22 0318 07/19/22 0252 07/20/22 0550 07/21/22 0239  CKTOTAL 2,617* 2,396* 1,170* 402* 252*   BNP: Invalid input(s): "POCBNP" CBG: No results for input(s): "GLUCAP" in the last 168 hours. D-Dimer No results for input(s): "DDIMER" in the last 72 hours. Hgb A1c No results for input(s): "HGBA1C" in the last 72 hours. Lipid Profile No results for input(s): "CHOL", "HDL", "LDLCALC", "TRIG", "CHOLHDL", "LDLDIRECT" in the last 72 hours. Thyroid function studies Recent Labs    07/19/22 1533  TSH 4.018   Anemia work up No results for input(s): "VITAMINB12", "FOLATE", "FERRITIN", "TIBC", "IRON", "RETICCTPCT"  in the last 72 hours. Microbiology Recent Results (from the past 240 hour(s))  Culture, blood (routine x 2)     Status: None   Collection Time: 07/15/22 10:43 PM   Specimen: BLOOD  Result Value Ref Range Status   Specimen Description   Final    BLOOD SITE NOT SPECIFIED Performed at [facility] [address] [place], [place] [address]    Special Requests   Final    BOTTLES DRAWN AEROBIC AND ANAEROBIC Blood Culture results may not be optimal due to an inadequate volume of blood received in culture bottles Performed at [facility] [address], [place], [place] [address]    Culture   Final    NO GROWTH 5 DAYS Performed at [facility], [place] [address], [place],  [address]    Report Status 07/21/2022 FINAL  Final  Resp Panel by RT-PCR (Flu A&B, Covid) Anterior Nasal Swab     Status: None   Collection Time: 07/15/22 11:38 PM   Specimen: Anterior Nasal Swab  Result Value Ref Range Status   SARS Coronavirus 2 by RT PCR NEGATIVE NEGATIVE Final    Comment: (NOTE) SARS-CoV-2 target nucleic acids are NOT DETECTED.  The SARS-CoV-2 RNA is generally detectable in upper respiratory specimens during the acute phase of infection. The lowest concentration of SARS-CoV-2 viral copies this assay can detect is 138 copies/mL. A negative result does not preclude SARS-Cov-2 infection and should not be used as the sole basis for treatment or other patient management decisions. A negative result may occur with  improper specimen collection/handling, submission of specimen other  than nasopharyngeal swab, presence of viral mutation(s) within the areas targeted by this assay, and inadequate number of viral copies(<138 copies/mL). A negative result must be combined with clinical observations, patient history, and epidemiological information. The expected result is Negative.  Fact Sheet for Patients:   EntrepreneurPulse.com.au  Fact Sheet for Healthcare Providers:  IncredibleEmployment.be  This test is no t yet approved or cleared by the [place] FDA and  has been authorized for detection and/or diagnosis of SARS-CoV-2 by FDA under an Emergency Use Authorization (EUA). This EUA will remain  in effect (meaning this test can be used) for the duration of the COVID-19 declaration under Section 564(b)(1) of the Act, 21 U.S.C.section 360bbb-3(b)(1), unless the authorization is terminated  or revoked sooner.       Influenza A by PCR NEGATIVE NEGATIVE Final   Influenza B by PCR NEGATIVE NEGATIVE Final    Comment: (NOTE) The Xpert Xpress SARS-CoV-2/FLU/RSV plus assay is intended as an aid in the diagnosis of influenza from Nasopharyngeal swab specimens and should not be used as a sole basis for treatment. Nasal washings and aspirates are unacceptable for Xpert Xpress SARS-CoV-2/FLU/RSV testing.  Fact Sheet for Patients: EntrepreneurPulse.com.au  Fact Sheet for Healthcare Providers: IncredibleEmployment.be  This test is not yet approved or cleared by the [place] FDA and has been authorized for detection and/or diagnosis of SARS-CoV-2 by FDA under an Emergency Use Authorization (EUA). This EUA will remain in effect (meaning this test can be used) for the duration of the COVID-19 declaration under Section 564(b)(1) of the Act, 21 U.S.C. section 360bbb-3(b)(1), unless the authorization is terminated or revoked.  Performed at [facility], [place] [address] [place], [place] [address]   Urine Culture     Status: Abnormal   Collection Time: 07/16/22 12:24 AM   Specimen: In/Out Cath Urine  Result Value Ref Range Status   Specimen Description   Final    IN/OUT CATH URINE Performed at [facility] [address] [place], [place] [address]    Special Requests   Final     NONE Performed at [facility], [place] [address], [place], [place] [address]    Culture 2,000 COLONIES/mL ESCHERICHIA COLI (A)  Final   Report Status 07/18/2022 FINAL  Final   Organism ID, Bacteria ESCHERICHIA COLI (A)  Final      Susceptibility   Escherichia coli - MIC*    AMPICILLIN <=2 SENSITIVE Sensitive     CEFAZOLIN <=4 SENSITIVE Sensitive     CEFEPIME <=0.12 SENSITIVE Sensitive     CEFTRIAXONE <=0.25 SENSITIVE Sensitive     CIPROFLOXACIN >=4 RESISTANT Resistant     GENTAMICIN <=1 SENSITIVE Sensitive     IMIPENEM <=0.25 SENSITIVE Sensitive     NITROFURANTOIN <=16 SENSITIVE Sensitive     TRIMETH/SULFA >=320 RESISTANT Resistant     AMPICILLIN/SULBACTAM <=2 SENSITIVE Sensitive     PIP/TAZO <=4 SENSITIVE Sensitive     * 2,000 COLONIES/mL ESCHERICHIA COLI  MRSA Next Gen by PCR, Nasal     Status: None   Collection Time: 07/16/22  5:50 AM   Specimen: Nasal Mucosa; Nasal Swab  Result Value Ref Range Status   MRSA by PCR Next Gen NOT DETECTED NOT DETECTED Final    Comment: (NOTE) The GeneXpert MRSA Assay (FDA approved for NASAL specimens only), is one component of a comprehensive MRSA colonization surveillance program. It is not intended to diagnose MRSA infection nor to guide or monitor treatment for MRSA infections. Test performance  is not FDA approved in patients less than 49 years old. Performed at [facility], [place] [address], [place], [place] [address]   Culture, blood (Routine X 2) w Reflex to ID Panel     Status: None   Collection Time: 07/16/22  9:26 PM   Specimen: BLOOD  Result Value Ref Range Status   Specimen Description   Final    BLOOD BLOOD RIGHT HAND Performed at [facility] [address], [place], [place] [address]    Special Requests   Final    IN PEDIATRIC BOTTLE Blood Culture adequate volume Performed at [facility] [address], [place], [place] [address]    Culture    Final    NO GROWTH 5 DAYS Performed at [facility], [place] [address], [place], [place] [address]    Report Status 07/21/2022 FINAL  Final  Culture, blood (Routine X 2) w Reflex to ID Panel     Status: None   Collection Time: 07/16/22  9:26 PM   Specimen: BLOOD  Result Value Ref Range Status   Specimen Description   Final    BLOOD BLOOD LEFT HAND Performed at [facility] [address], [employer], [place] [address]    Special Requests   Final    IN PEDIATRIC BOTTLE Blood Culture adequate volume Performed at [facility] [address] [place], [place] [address]    Culture   Final    NO GROWTH 5 DAYS Performed at [facility], [place] [address] [place] [address], [place], [place] [address]    Report Status 07/21/2022 FINAL  Final    Time coordinating discharge: 35 minutes  Signed: Aminta Sakurai  [facility] 07/22/2022, 11:51 AM

## 2022-07-22 NOTE — TOC Transition Note (Signed)
Transition of Care [facility]) - CM/SW Discharge Note   Patient Details  Name: Stephanie Benton MRN: [id number] Date of Birth: 1951-07-03  [facility] CM/SW Contact:  Otelia Santee, LCSW Phone Number: 07/22/2022, 11:59 AM   Clinical Narrative:    Pt is to transfer to [facility] for SNF placement. Pt will be going to room 127b. RN to call report to [phone]. [facility] will be arranged to transport pt to facility. CSW left voicemail with pt's son to inform of discharge plans.    Final next level of care: Skilled Nursing Facility Barriers to Discharge: Barriers Resolved   Patient Goals and CMS Choice        Discharge Placement PASRR number recieved: 07/18/22            Patient chooses bed at: [facility] Patient to be transferred to facility by: [facility] Name of family member notified: Left voicemail with son Patient and family notified of of transfer: 07/22/22  Discharge Plan and Services                                     Social Determinants of Health (SDOH) Interventions     Readmission Risk Interventions    07/22/2022   11:58 AM 07/18/2022    8:43 AM 07/10/2022   10:49 AM  Readmission Risk Prevention Plan  Transportation Screening  Complete Complete  Medication Review (RN Care Manager)  Complete Complete  PCP or Specialist appointment within 3-5 days of discharge  Complete Complete  HRI or Home Care Consult  Complete Complete  SW Recovery Care/Counseling Consult  Complete Complete  Palliative Care Screening  Not Applicable Not Applicable  Skilled Nursing Facility Complete Not Applicable

## 2022-07-22 NOTE — Progress Notes (Addendum)
Report called to receiving nurse at [facility].   Unable to complete admission questions at this time. Patient unable to answer questions, unable to contact family member at this time.

## 2022-08-13 ENCOUNTER — Emergency Department (HOSPITAL_COMMUNITY): Payer: 59

## 2022-08-13 ENCOUNTER — Inpatient Hospital Stay (HOSPITAL_COMMUNITY): Payer: 59

## 2022-08-13 ENCOUNTER — Other Ambulatory Visit: Payer: Self-pay

## 2022-08-13 ENCOUNTER — Inpatient Hospital Stay (HOSPITAL_COMMUNITY)
Admission: EM | Admit: 2022-08-13 | Discharge: 2022-08-18 | DRG: 870 | Disposition: A | Discharge status: Skilled Nursing Facility | Payer: 59 | Source: Skilled Nursing Facility | Attending: Internal Medicine | Admitting: Internal Medicine

## 2022-08-13 DIAGNOSIS — G9340 Encephalopathy, unspecified: Secondary | ICD-10-CM | POA: Diagnosis not present

## 2022-08-13 DIAGNOSIS — T17890A Other foreign object in other parts of respiratory tract causing asphyxiation, initial encounter: Secondary | ICD-10-CM | POA: Diagnosis not present

## 2022-08-13 DIAGNOSIS — J690 Pneumonitis due to inhalation of food and vomit: Secondary | ICD-10-CM | POA: Diagnosis not present

## 2022-08-13 DIAGNOSIS — Z79899 Other long term (current) drug therapy: Secondary | ICD-10-CM | POA: Diagnosis not present

## 2022-08-13 DIAGNOSIS — I5032 Chronic diastolic (congestive) heart failure: Secondary | ICD-10-CM | POA: Diagnosis present

## 2022-08-13 DIAGNOSIS — G9341 Metabolic encephalopathy: Secondary | ICD-10-CM | POA: Diagnosis not present

## 2022-08-13 DIAGNOSIS — I471 Supraventricular tachycardia, unspecified: Secondary | ICD-10-CM | POA: Diagnosis not present

## 2022-08-13 DIAGNOSIS — N179 Acute kidney failure, unspecified: Secondary | ICD-10-CM | POA: Diagnosis not present

## 2022-08-13 DIAGNOSIS — J449 Chronic obstructive pulmonary disease, unspecified: Secondary | ICD-10-CM | POA: Diagnosis not present

## 2022-08-13 DIAGNOSIS — T17998A Other foreign object in respiratory tract, part unspecified causing other injury, initial encounter: Secondary | ICD-10-CM | POA: Diagnosis not present

## 2022-08-13 DIAGNOSIS — J440 Chronic obstructive pulmonary disease with acute lower respiratory infection: Secondary | ICD-10-CM | POA: Diagnosis present

## 2022-08-13 DIAGNOSIS — Z66 Do not resuscitate: Secondary | ICD-10-CM | POA: Diagnosis present

## 2022-08-13 DIAGNOSIS — K921 Melena: Secondary | ICD-10-CM | POA: Diagnosis not present

## 2022-08-13 DIAGNOSIS — F3178 Bipolar disorder, in full remission, most recent episode mixed: Secondary | ICD-10-CM | POA: Diagnosis present

## 2022-08-13 DIAGNOSIS — K3189 Other diseases of stomach and duodenum: Secondary | ICD-10-CM | POA: Diagnosis present

## 2022-08-13 DIAGNOSIS — Z6829 Body mass index (BMI) 29.0-29.9, adult: Secondary | ICD-10-CM

## 2022-08-13 DIAGNOSIS — I10 Essential (primary) hypertension: Secondary | ICD-10-CM | POA: Diagnosis not present

## 2022-08-13 DIAGNOSIS — E43 Unspecified severe protein-calorie malnutrition: Secondary | ICD-10-CM | POA: Diagnosis present

## 2022-08-13 DIAGNOSIS — J9811 Atelectasis: Secondary | ICD-10-CM | POA: Diagnosis present

## 2022-08-13 DIAGNOSIS — U071 COVID-19: Secondary | ICD-10-CM | POA: Diagnosis present

## 2022-08-13 DIAGNOSIS — A419 Sepsis, unspecified organism: Secondary | ICD-10-CM | POA: Diagnosis present

## 2022-08-13 DIAGNOSIS — E7800 Pure hypercholesterolemia, unspecified: Secondary | ICD-10-CM | POA: Diagnosis present

## 2022-08-13 DIAGNOSIS — Z91013 Allergy to seafood: Secondary | ICD-10-CM

## 2022-08-13 DIAGNOSIS — R918 Other nonspecific abnormal finding of lung field: Secondary | ICD-10-CM | POA: Diagnosis not present

## 2022-08-13 DIAGNOSIS — L89159 Pressure ulcer of sacral region, unspecified stage: Secondary | ICD-10-CM | POA: Diagnosis present

## 2022-08-13 DIAGNOSIS — R6520 Severe sepsis without septic shock: Secondary | ICD-10-CM | POA: Diagnosis present

## 2022-08-13 DIAGNOSIS — K861 Other chronic pancreatitis: Secondary | ICD-10-CM | POA: Diagnosis present

## 2022-08-13 DIAGNOSIS — Z720 Tobacco use: Secondary | ICD-10-CM | POA: Diagnosis present

## 2022-08-13 DIAGNOSIS — Z809 Family history of malignant neoplasm, unspecified: Secondary | ICD-10-CM

## 2022-08-13 DIAGNOSIS — X58XXXA Exposure to other specified factors, initial encounter: Secondary | ICD-10-CM | POA: Diagnosis not present

## 2022-08-13 DIAGNOSIS — K449 Diaphragmatic hernia without obstruction or gangrene: Secondary | ICD-10-CM | POA: Diagnosis present

## 2022-08-13 DIAGNOSIS — E860 Dehydration: Secondary | ICD-10-CM | POA: Diagnosis present

## 2022-08-13 DIAGNOSIS — D649 Anemia, unspecified: Secondary | ICD-10-CM | POA: Diagnosis present

## 2022-08-13 DIAGNOSIS — Z800 Family history of malignant neoplasm of digestive organs: Secondary | ICD-10-CM

## 2022-08-13 DIAGNOSIS — J398 Other specified diseases of upper respiratory tract: Secondary | ICD-10-CM | POA: Diagnosis not present

## 2022-08-13 DIAGNOSIS — D62 Acute posthemorrhagic anemia: Secondary | ICD-10-CM | POA: Diagnosis not present

## 2022-08-13 DIAGNOSIS — K2100 Gastro-esophageal reflux disease with esophagitis, without bleeding: Secondary | ICD-10-CM | POA: Diagnosis present

## 2022-08-13 DIAGNOSIS — J9601 Acute respiratory failure with hypoxia: Secondary | ICD-10-CM | POA: Diagnosis present

## 2022-08-13 DIAGNOSIS — R34 Anuria and oliguria: Secondary | ICD-10-CM | POA: Diagnosis not present

## 2022-08-13 DIAGNOSIS — Z7189 Other specified counseling: Secondary | ICD-10-CM | POA: Diagnosis not present

## 2022-08-13 DIAGNOSIS — Z515 Encounter for palliative care: Secondary | ICD-10-CM

## 2022-08-13 DIAGNOSIS — Z885 Allergy status to narcotic agent status: Secondary | ICD-10-CM

## 2022-08-13 DIAGNOSIS — E785 Hyperlipidemia, unspecified: Secondary | ICD-10-CM | POA: Diagnosis present

## 2022-08-13 DIAGNOSIS — F17210 Nicotine dependence, cigarettes, uncomplicated: Secondary | ICD-10-CM | POA: Diagnosis present

## 2022-08-13 DIAGNOSIS — R54 Age-related physical debility: Secondary | ICD-10-CM | POA: Diagnosis not present

## 2022-08-13 DIAGNOSIS — Z886 Allergy status to analgesic agent status: Secondary | ICD-10-CM

## 2022-08-13 DIAGNOSIS — I110 Hypertensive heart disease with heart failure: Secondary | ICD-10-CM | POA: Diagnosis present

## 2022-08-13 DIAGNOSIS — R52 Pain, unspecified: Secondary | ICD-10-CM | POA: Diagnosis not present

## 2022-08-13 DIAGNOSIS — Z888 Allergy status to other drugs, medicaments and biological substances status: Secondary | ICD-10-CM

## 2022-08-13 DIAGNOSIS — E876 Hypokalemia: Secondary | ICD-10-CM | POA: Diagnosis not present

## 2022-08-13 DIAGNOSIS — K2970 Gastritis, unspecified, without bleeding: Secondary | ICD-10-CM | POA: Diagnosis present

## 2022-08-13 DIAGNOSIS — Z7401 Bed confinement status: Secondary | ICD-10-CM

## 2022-08-13 DIAGNOSIS — J189 Pneumonia, unspecified organism: Secondary | ICD-10-CM | POA: Diagnosis present

## 2022-08-13 DIAGNOSIS — R1310 Dysphagia, unspecified: Secondary | ICD-10-CM | POA: Diagnosis present

## 2022-08-13 DIAGNOSIS — Z8249 Family history of ischemic heart disease and other diseases of the circulatory system: Secondary | ICD-10-CM

## 2022-08-13 DIAGNOSIS — R29898 Other symptoms and signs involving the musculoskeletal system: Secondary | ICD-10-CM | POA: Diagnosis not present

## 2022-08-13 DIAGNOSIS — T17908S Unspecified foreign body in respiratory tract, part unspecified causing other injury, sequela: Secondary | ICD-10-CM | POA: Diagnosis not present

## 2022-08-13 DIAGNOSIS — R5381 Other malaise: Secondary | ICD-10-CM | POA: Diagnosis not present

## 2022-08-13 DIAGNOSIS — F419 Anxiety disorder, unspecified: Secondary | ICD-10-CM | POA: Diagnosis not present

## 2022-08-13 DIAGNOSIS — R4701 Aphasia: Secondary | ICD-10-CM | POA: Diagnosis not present

## 2022-08-13 DIAGNOSIS — Z90710 Acquired absence of both cervix and uterus: Secondary | ICD-10-CM

## 2022-08-13 DIAGNOSIS — Z880 Allergy status to penicillin: Secondary | ICD-10-CM

## 2022-08-13 DIAGNOSIS — Z882 Allergy status to sulfonamides status: Secondary | ICD-10-CM

## 2022-08-13 DIAGNOSIS — R4589 Other symptoms and signs involving emotional state: Secondary | ICD-10-CM | POA: Diagnosis not present

## 2022-08-13 LAB — PROCALCITONIN
Procalcitonin: 2.5 ng/mL
Procalcitonin: 2.85 ng/mL

## 2022-08-13 LAB — COMPREHENSIVE METABOLIC PANEL
ALT: 14 U/L (ref 0–44)
ALT: 13 U/L (ref 0–44)
AST: 19 U/L (ref 15–41)
AST: 18 U/L (ref 15–41)
Albumin: 1.7 g/dL — ABNORMAL LOW (ref 3.5–5.0)
Albumin: 1.6 g/dL — ABNORMAL LOW (ref 3.5–5.0)
Alkaline Phosphatase: 98 U/L (ref 38–126)
Alkaline Phosphatase: 93 U/L (ref 38–126)
Anion gap: 9 (ref 5–15)
Anion gap: 9 (ref 5–15)
BUN: 10 mg/dL (ref 8–23)
BUN: 11 mg/dL (ref 8–23)
CO2: 19 mmol/L — ABNORMAL LOW (ref 22–32)
CO2: 20 mmol/L — ABNORMAL LOW (ref 22–32)
Calcium: 7.9 mg/dL — ABNORMAL LOW (ref 8.9–10.3)
Calcium: 7.6 mg/dL — ABNORMAL LOW (ref 8.9–10.3)
Chloride: 116 mmol/L — ABNORMAL HIGH (ref 98–111)
Chloride: 115 mmol/L — ABNORMAL HIGH (ref 98–111)
Creatinine, Ser: 1.18 mg/dL — ABNORMAL HIGH (ref 0.44–1.00)
Creatinine, Ser: 1.19 mg/dL — ABNORMAL HIGH (ref 0.44–1.00)
GFR, Estimated: 49 mL/min — ABNORMAL LOW (ref >60)
GFR, Estimated: 49 mL/min — ABNORMAL LOW (ref >60)
Glucose, Bld: 85 mg/dL (ref 70–99)
Glucose, Bld: 94 mg/dL (ref 70–99)
Potassium: 3.8 mmol/L (ref 3.5–5.1)
Potassium: 3.7 mmol/L (ref 3.5–5.1)
Sodium: 144 mmol/L (ref 135–145)
Sodium: 144 mmol/L (ref 135–145)
Total Bilirubin: 0.9 mg/dL (ref 0.3–1.2)
Total Bilirubin: 1.2 mg/dL (ref 0.3–1.2)
Total Protein: 4.9 g/dL — ABNORMAL LOW (ref 6.5–8.1)
Total Protein: 4.6 g/dL — ABNORMAL LOW (ref 6.5–8.1)

## 2022-08-13 LAB — IRON AND TIBC: Iron: 11 ug/dL — ABNORMAL LOW (ref 28–170)

## 2022-08-13 LAB — CORTISOL: Cortisol, Plasma: 24 ug/dL

## 2022-08-13 LAB — CBC WITH DIFFERENTIAL/PLATELET
Abs Immature Granulocytes: 0.46 10*3/uL — ABNORMAL HIGH (ref 0.00–0.07)
Basophils Absolute: 0.1 10*3/uL (ref 0.0–0.1)
Basophils Relative: 0 %
Eosinophils Absolute: 0 10*3/uL (ref 0.0–0.5)
Eosinophils Relative: 0 %
HCT: 24.7 % — ABNORMAL LOW (ref 36.0–46.0)
Hemoglobin: 8.5 g/dL — ABNORMAL LOW (ref 12.0–15.0)
Immature Granulocytes: 2 %
Lymphocytes Relative: 4 %
Lymphs Abs: 1 10*3/uL (ref 0.7–4.0)
MCH: 32 pg (ref 26.0–34.0)
MCHC: 34.4 g/dL (ref 30.0–36.0)
MCV: 92.9 fL (ref 80.0–100.0)
Monocytes Absolute: 1.5 10*3/uL — ABNORMAL HIGH (ref 0.1–1.0)
Monocytes Relative: 6 %
Neutro Abs: 21.6 10*3/uL — ABNORMAL HIGH (ref 1.7–7.7)
Neutrophils Relative %: 88 %
Platelets: 334 10*3/uL (ref 150–400)
RBC: 2.66 MIL/uL — ABNORMAL LOW (ref 3.87–5.11)
RDW: 18.9 % — ABNORMAL HIGH (ref 11.5–15.5)
WBC: 24.6 10*3/uL — ABNORMAL HIGH (ref 4.0–10.5)
nRBC: 0 % (ref 0.0–0.2)

## 2022-08-13 LAB — LACTATE DEHYDROGENASE: LDH: 216 U/L — ABNORMAL HIGH (ref 98–192)

## 2022-08-13 LAB — I-STAT VENOUS BLOOD GAS, ED
Acid-base deficit: 2 mmol/L (ref 0.0–2.0)
Bicarbonate: 21.9 mmol/L (ref 20.0–28.0)
Calcium, Ion: 1.03 mmol/L — ABNORMAL LOW (ref 1.15–1.40)
HCT: 30 % — ABNORMAL LOW (ref 36.0–46.0)
Hemoglobin: 10.2 g/dL — ABNORMAL LOW (ref 12.0–15.0)
O2 Saturation: 99 %
Potassium: 7.9 mmol/L (ref 3.5–5.1)
Sodium: 144 mmol/L (ref 135–145)
TCO2: 23 mmol/L (ref 22–32)
pCO2, Ven: 33 mmHg — ABNORMAL LOW (ref 44–60)
pH, Ven: 7.429 (ref 7.25–7.43)
pO2, Ven: 118 mmHg — ABNORMAL HIGH (ref 32–45)

## 2022-08-13 LAB — APTT: aPTT: 54 seconds — ABNORMAL HIGH (ref 24–36)

## 2022-08-13 LAB — RESP PANEL BY RT-PCR (RSV, FLU A&B, COVID)  RVPGX2
Influenza A by PCR: NEGATIVE
Influenza B by PCR: NEGATIVE
Resp Syncytial Virus by PCR: NEGATIVE
SARS Coronavirus 2 by RT PCR: POSITIVE — AB

## 2022-08-13 LAB — FOLATE: Folate: 14.5 ng/mL (ref >5.9)

## 2022-08-13 LAB — PROTIME-INR
INR: 1.4 — ABNORMAL HIGH (ref 0.8–1.2)
Prothrombin Time: 17.3 seconds — ABNORMAL HIGH (ref 11.4–15.2)

## 2022-08-13 LAB — LACTIC ACID, PLASMA: Lactic Acid, Venous: 1 mmol/L (ref 0.5–1.9)

## 2022-08-13 LAB — C-REACTIVE PROTEIN: CRP: 10.9 mg/dL — ABNORMAL HIGH (ref <1.0)

## 2022-08-13 LAB — TSH: TSH: 1.109 u[IU]/mL (ref 0.350–4.500)

## 2022-08-13 LAB — CK: Total CK: 29 U/L — ABNORMAL LOW (ref 38–234)

## 2022-08-13 LAB — LIPASE, BLOOD: Lipase: 46 U/L (ref 11–51)

## 2022-08-13 LAB — FERRITIN: Ferritin: 315 ng/mL — ABNORMAL HIGH (ref 11–307)

## 2022-08-13 LAB — VITAMIN B12: Vitamin B-12: 313 pg/mL (ref 180–914)

## 2022-08-13 LAB — MAGNESIUM: Magnesium: 1.9 mg/dL (ref 1.7–2.4)

## 2022-08-13 LAB — PHOSPHORUS: Phosphorus: 3.6 mg/dL (ref 2.5–4.6)

## 2022-08-13 LAB — TROPONIN I (HIGH SENSITIVITY): Troponin I (High Sensitivity): 15 ng/L (ref <18)

## 2022-08-13 LAB — HIV ANTIBODY (ROUTINE TESTING W REFLEX): HIV Screen 4th Generation wRfx: NONREACTIVE

## 2022-08-13 MED ORDER — ACETAMINOPHEN 650 MG RE SUPP
650.0000 mg | Freq: Once | RECTAL | Status: AC
  Administered 2022-08-13: 650 mg via RECTAL
  Filled 2022-08-13: qty 1

## 2022-08-13 MED ORDER — VANCOMYCIN HCL 750 MG/150ML IV SOLN
750.0000 mg | INTRAVENOUS | Status: DC
  Administered 2022-08-14: 750 mg via INTRAVENOUS
  Filled 2022-08-13 (×2): qty 150

## 2022-08-13 MED ORDER — LACTATED RINGERS IV BOLUS (SEPSIS)
1000.0000 mL | Freq: Once | INTRAVENOUS | Status: AC
  Administered 2022-08-13: 1000 mL via INTRAVENOUS

## 2022-08-13 MED ORDER — SODIUM CHLORIDE 0.9 % IV SOLN
500.0000 mg | INTRAVENOUS | Status: DC
  Administered 2022-08-13 – 2022-08-15 (×3): 500 mg via INTRAVENOUS
  Filled 2022-08-13 (×3): qty 5

## 2022-08-13 MED ORDER — ALBUMIN HUMAN 5 % IV SOLN
12.5000 g | Freq: Once | INTRAVENOUS | Status: AC
  Administered 2022-08-13: 12.5 g via INTRAVENOUS
  Filled 2022-08-13: qty 250

## 2022-08-13 MED ORDER — LACTATED RINGERS IV SOLN: INTRAVENOUS | Status: AC

## 2022-08-13 MED ORDER — NICOTINE 21 MG/24HR TD PT24
21.0000 mg | MEDICATED_PATCH | Freq: Every day | TRANSDERMAL | Status: DC
  Administered 2022-08-14 – 2022-08-17 (×4): 21 mg via TRANSDERMAL
  Filled 2022-08-13 (×4): qty 1

## 2022-08-13 MED ORDER — METRONIDAZOLE 500 MG/100ML IV SOLN
500.0000 mg | Freq: Once | INTRAVENOUS | Status: AC
  Administered 2022-08-13: 500 mg via INTRAVENOUS
  Filled 2022-08-13: qty 100

## 2022-08-13 MED ORDER — VANCOMYCIN HCL IN DEXTROSE 1-5 GM/200ML-% IV SOLN: 1000.0000 mg | Freq: Once | INTRAVENOUS | Status: DC

## 2022-08-13 MED ORDER — LACTATED RINGERS IV BOLUS (SEPSIS)
500.0000 mL | Freq: Once | INTRAVENOUS | Status: AC
  Administered 2022-08-13: 500 mL via INTRAVENOUS

## 2022-08-13 MED ORDER — SODIUM CHLORIDE 0.9 % IV SOLN
2.0000 g | Freq: Three times a day (TID) | INTRAVENOUS | Status: DC
  Administered 2022-08-13 – 2022-08-14 (×2): 2 g via INTRAVENOUS
  Filled 2022-08-13 (×2): qty 10

## 2022-08-13 MED ORDER — VANCOMYCIN HCL 1500 MG/300ML IV SOLN
1500.0000 mg | Freq: Once | INTRAVENOUS | Status: AC
  Administered 2022-08-13: 1500 mg via INTRAVENOUS
  Filled 2022-08-13: qty 300

## 2022-08-13 MED ORDER — SODIUM CHLORIDE 0.9 % IV SOLN
2.0000 g | Freq: Once | INTRAVENOUS | Status: AC
  Administered 2022-08-13: 2 g via INTRAVENOUS
  Filled 2022-08-13: qty 10

## 2022-08-13 MED ORDER — LACTATED RINGERS IV SOLN: INTRAVENOUS | Status: DC

## 2022-08-13 MED ORDER — IOHEXOL 350 MG/ML SOLN
75.0000 mL | Freq: Once | INTRAVENOUS | Status: AC | PRN
  Administered 2022-08-13: 75 mL via INTRAVENOUS

## 2022-08-13 NOTE — Assessment & Plan Note (Signed)
In the setting of significant infection.  Will rehydrate treat underlying infection.  - most likely multifactorial secondary to combination of  infection   dehydration secondary to decreased by mouth intake,  polypharmacy   - Will rehydrate   - treat underlining infection   - Hold contributing medications   - if no improvement may need further imaging to evaluate for CNS pathology pathology such as MRI of the brain   - neurological exam appears to be nonfocal but patient unable to cooperate fully   - VBG unremarkable no evidence of hypercarbia

## 2022-08-13 NOTE — Subjective & Objective (Signed)
Brpught in by EMS. Lives at [facility] alone Last month was hospitalized for sepsis Recently was diagnosed with COVID and CAP started on Abx Levaquin Noted to minimal confused on EMS arrival febrile up to 102.1 hypotensive 80s over 40s Hypoxic to mid 80s requiring nonrebreather Patient unable to provide her own history Currently unable to provide her own history family is not available to collaborate

## 2022-08-13 NOTE — Assessment & Plan Note (Signed)
-   Spoke about importance of quitting spent 5 minutes discussing options for treatment, prior attempts at quitting, and dangers of smoking ? -At this point patient is    NOT  interested in quitting ? - order nicotine patch  ? - nursing tobacco cessation protocol ? ?

## 2022-08-13 NOTE — Sepsis Progress Note (Signed)
Notified bedside nurse of need to draw repeat lactic acid. 

## 2022-08-13 NOTE — ED Triage Notes (Signed)
Pt BIB [facility] due to being altered from the [facility]. Recently had covid with pneumonia was on antibiotics.  102.1 axillary 80/40 pressure  99 non- rebreather

## 2022-08-13 NOTE — Assessment & Plan Note (Signed)
-  SIRS criteria met with  elevated white blood cell count,       Component Value Date/Time   WBC 24.6 (H) 08/13/2022 1440   LYMPHSABS 1.0 08/13/2022 1440    tachycardia   ,   fever   RR >20 Today's Vitals   08/13/22 1815 08/13/22 1820 08/13/22 1830 08/13/22 1845  BP: 94/67 (!) 124/46 111/65 (!) 153/61  Pulse: 87 87 81 88  Resp: 17 18 18 15   Temp:      TempSrc:      SpO2: 98% 100% 95% 97%   There is no height or weight on file to calculate BMI.   The recent clinical data is shown below. Vitals:   08/13/22 1815 08/13/22 1820 08/13/22 1830 08/13/22 1845  BP: 94/67 (!) 124/46 111/65 (!) 153/61  Pulse: 87 87 81 88  Resp: 17 18 18 15   Temp:      TempSrc:      SpO2: 98% 100% 95% 97%     -Most likely source being: pulmonary,      Patient meeting criteria for Severe sepsis with    evidence of end organ damage/organ dysfunction such as      elevated lactic acid >2     Component Value Date/Time   LATICACIDVEN 4.9 (HH) 08/13/2022 6269    acute metabolic encephalopathy  SWN<46 mmhg or MAP < 65 mmhg,   Acute hypoxia requiring new supplemental oxygen, SpO2: 97 %       Patient is meeting criteria for SEPTIC SHOCK with  lactic acid > 4 or multiple SBP readings below 90 mmhg or MAP reading below 65 mmhg   - Obtain serial lactic acid and procalcitonin level.  - Initiated IV antibiotics in ER: Antibiotics Given (last 72 hours)     Date/Time Action Medication Dose Rate   08/13/22 1432 New Bag/Given   aztreonam (AZACTAM) 2 g in sodium chloride 0.9 % 100 mL IVPB 2 g 200 mL/hr   08/13/22 1529 New Bag/Given   vancomycin (VANCOREADY) IVPB 1500 mg/300 mL 1,500 mg 150 mL/hr   08/13/22 1700 New Bag/Given   metroNIDAZOLE (FLAGYL) IVPB 500 mg 500 mg 100 mL/hr       Will continue  on : Estring on vancomycin and azithromycin  - await results of blood and urine culture  - Rehydrate aggressively  Intravenous fluids were administered,          30cc/kg fluid   7:36 PM

## 2022-08-13 NOTE — Sepsis Progress Note (Signed)
Confirmed with bedside RN Caryl Pina that blood cultures were drawn before antibiotic was given.

## 2022-08-13 NOTE — H&P (Addendum)
Stephanie Benton:[id number] DOB: April 18, 1951 DOA: 08/13/2022   PCP: Sharmon Revere, MD   Outpatient Specialists:    GI Dr. Elnoria Howard  Patient arrived to ER on 08/13/22 at 1327 Referred by Attending Therisa Doyne, MD   Patient coming from:     From facility  [facility]  Chief Complaint:   Chief Complaint  Patient presents with   Altered Mental Status    HPI: Stephanie Benton is a 72 y.o. female with medical history significant of HTN, HLD, chronic abdominal pain, anxiety, bipolar disorder,  cholecystectomy  Chronic abdominal pain with pancreatitis and esophagitis Presented with confusion Brpught in by EMS. Lives at [facility] independent living alone Last month was hospitalized for sepsis Recently was diagnosed with COVID and CAP started on Abx Levaquin Noted to minimal confused on EMS arrival febrile up to 102.1 hypotensive 80s over 40s Hypoxic to mid 80s requiring nonrebreather Patient unable to provide her own history Currently unable to provide her own history family is not available to collaborate   Son states she tested positive for covid 2 wks ago Son states he does not drink a lot but does smoke   Son states she has been confused in the past and son have not been able to see her for the past 10 days   Initial COVID TEST    POSITIVE,     Lab Results  Component Value Date   SARSCOV2NAA POSITIVE (A) 08/13/2022   SARSCOV2NAA NEGATIVE 07/15/2022   SARSCOV2NAA NEGATIVE 05/17/2022   SARSCOV2NAA NEGATIVE 10/11/2019     Regarding pertinent Chronic problems:       HTN on Coreg telmisartan   chronic CHF diastolic  last echo Oct 2023 Grade I diastolic dysfunction  Demadex     COPD -on Symbicort  Chronic anemia - baseline hg Hemoglobin & Hematocrit  Recent Labs    07/22/22 0317 08/13/22 1417 08/13/22 1440  HGB 9.2* 10.2* 8.5*     While in ER: Clinical Course as of 08/13/22 1907  Tue Aug 13, 2022  1552 Lactic Acid, Venous(!!): 4.9 Abx of   [JS]  1754 SARS Coronavirus 2 by RT PCR(!): POSITIVE [JS]    Clinical Course User Index [JS] Claude Manges, PA-C  Found to have severe sepsis hypotensive given IV fluids Central line placed patient has DO NOT RESUSCITATE DO NOT INTUBATE at bedside family was contacted but it was not available.  Patient's blood pressure improved with IV fluids started on broad-spectrum antibiotics Including azithromycin Flagyl and vancomycin and imaging showed evidence of left lower lobe pneumonia   CXR - nonspecific and may represent the patient's hiatal hernia.     CT  chest abd/pelvis -  . Left lower lobe consolidation, suspicious for pneumonia and/or aspiration.Stable chronic fluid collection along the anterior mesh from prior ventral hernia repair  Following Medications were ordered in ER: Medications  lactated ringers infusion ( Intravenous New Bag/Given 08/13/22 1655)  azithromycin (ZITHROMAX) 500 mg in sodium chloride 0.9 % 250 mL IVPB (has no administration in time range)  lactated ringers bolus 1,000 mL (0 mLs Intravenous Stopped 08/13/22 1502)    And  lactated ringers bolus 1,000 mL (0 mLs Intravenous Stopped 08/13/22 1626)    And  lactated ringers bolus 500 mL (0 mLs Intravenous Stopped 08/13/22 1655)  aztreonam (AZACTAM) 2 g in sodium chloride 0.9 % 100 mL IVPB (0 g Intravenous Stopped 08/13/22 1626)  metroNIDAZOLE (FLAGYL) IVPB 500 mg (0 mg Intravenous Stopped 08/13/22 1850)  acetaminophen (TYLENOL) suppository  650 mg (650 mg Rectal Given 08/13/22 1412)  vancomycin (VANCOREADY) IVPB 1500 mg/300 mL (0 mg Intravenous Stopped 08/13/22 1850)  iohexol (OMNIPAQUE) 350 MG/ML injection 75 mL (75 mLs Intravenous Contrast Given 08/13/22 1727)       ED Triage Vitals  Enc Vitals Group     BP 08/13/22 1331 (!) 60/38     Pulse Rate 08/13/22 1331 (!) 111     Resp 08/13/22 1331 (!) 27     Temp 08/13/22 1331 (!) 102.1 F (38.9 C)     Temp Source 08/13/22 1331 Axillary     SpO2 08/13/22 1330 100 %     Weight --       Height --      Head Circumference --      Peak Flow --      Pain Score --      Pain Loc --      Pain Edu? --      Excl. in [place]? --   TMAX(24)@     _________________________________________ Significant initial  Findings: Abnormal Labs Reviewed  RESP PANEL BY RT-PCR (RSV, FLU A&B, COVID)  RVPGX2 - Abnormal; Notable for the following components:      Result Value   SARS Coronavirus 2 by RT PCR POSITIVE (*)    All other components within normal limits  LACTIC ACID, PLASMA - Abnormal; Notable for the following components:   Lactic Acid, Venous 4.9 (*)    All other components within normal limits  COMPREHENSIVE METABOLIC PANEL - Abnormal; Notable for the following components:   Chloride 116 (*)    CO2 19 (*)    Creatinine, Ser 1.18 (*)    Calcium 7.9 (*)    Total Protein 4.9 (*)    Albumin 1.7 (*)    GFR, Estimated 49 (*)    All other components within normal limits  CBC WITH DIFFERENTIAL/PLATELET - Abnormal; Notable for the following components:   WBC 24.6 (*)    RBC 2.66 (*)    Hemoglobin 8.5 (*)    HCT 24.7 (*)    RDW 18.9 (*)    Neutro Abs 21.6 (*)    Monocytes Absolute 1.5 (*)    Abs Immature Granulocytes 0.46 (*)    All other components within normal limits  PROTIME-INR - Abnormal; Notable for the following components:   Prothrombin Time 17.3 (*)    INR 1.4 (*)    All other components within normal limits  APTT - Abnormal; Notable for the following components:   aPTT 54 (*)    All other components within normal limits  I-STAT VENOUS BLOOD GAS, ED - Abnormal; Notable for the following components:   pCO2, Ven 33.0 (*)    pO2, Ven 118 (*)    Potassium 7.9 (*)    Calcium, Ion 1.03 (*)    HCT 30.0 (*)    Hemoglobin 10.2 (*)    All other components within normal limits    _________________________ Troponin  ordered ECG: Ordered Personally reviewed and interpreted by me showing: HR : 107 Rhythm: Sinus tachycardia Probable anteroseptal infarct, old when comapred to  prior, faster rate. No STEMI QTC 477   ____________________ This patient meets SIRS Criteria and may be septic.    The recent clinical data is shown below. Vitals:   08/13/22 1815 08/13/22 1820 08/13/22 1830 08/13/22 1845  BP: 94/67 (!) 124/46 111/65 (!) 153/61  Pulse: 87 87 81 88  Resp: 17 18 18 15   Temp:  TempSrc:      SpO2: 98% 100% 95% 97%    WBC     Component Value Date/Time   WBC 24.6 (H) 08/13/2022 1440   LYMPHSABS 1.0 08/13/2022 1440   MONOABS 1.5 (H) 08/13/2022 1440   EOSABS 0.0 08/13/2022 1440   BASOSABS 0.1 08/13/2022 1440     Lactic Acid, Venous    Component Value Date/Time   LATICACIDVEN 4.9 (HH) 08/13/2022 1440    Procalcitonin   Ordered Lactic Acid, Venous    Component Value Date/Time   LATICACIDVEN 4.9 (HH) 08/13/2022 1440     UA  ordered    Results for orders placed or performed during the hospital encounter of 08/13/22  Resp panel by RT-PCR (RSV, Flu A&B, Covid) Anterior Nasal Swab     Status: Abnormal   Collection Time: 08/13/22  1:44 PM   Specimen: Anterior Nasal Swab  Result Value Ref Range Status   SARS Coronavirus 2 by RT PCR POSITIVE (A) NEGATIVE Final         Influenza A by PCR NEGATIVE NEGATIVE Final   Influenza B by PCR NEGATIVE NEGATIVE Final         Resp Syncytial Virus by PCR NEGATIVE NEGATIVE Final          _______________________________________________ Hospitalist was called for admission for   COVID-19  Pneumonia of left lower lobe due to infectious organism  Sepsis without acute organ dysfunction, due to unspecified organism      The following Work up has been ordered so far:  Orders Placed This Encounter  Procedures   Blood Culture (routine x 2)   Resp panel by RT-PCR (RSV, Flu A&B, Covid) Anterior Nasal Swab   Expectorated Sputum Assessment w Gram Stain, Rflx to Resp Cult   DG Chest Port 1 View   DG Chest Portable 1 View   CT CHEST ABDOMEN PELVIS W CONTRAST   CT HEAD WO CONTRAST ( )   Lactic acid, plasma    Comprehensive metabolic panel   CBC with Differential   Protime-INR   APTT   Urinalysis, Routine w reflex microscopic   Comprehensive metabolic panel   CK   Procalcitonin - Baseline   Procalcitonin   Lactic acid, plasma   Magnesium   Osmolality, urine   Osmolality   Creatinine, urine, random   Phosphorus   Prealbumin   Sodium, urine, random   TSH   Urinalysis, Complete w Microscopic Urine, Clean Catch   Vitamin B12   Folate   Iron and TIBC   Ferritin   Reticulocytes   HIV Antibody (routine testing w rflx)   Lactic acid, plasma   Legionella Pneumophila Serogp 1 Ur Ag   Strep pneumoniae urinary antigen   C-reactive protein   Lactate dehydrogenase   Procalcitonin   Comprehensive metabolic panel   CBC with Differential/Platelet   C-reactive protein   D-dimer, quantitative   Ferritin   Magnesium   Phosphorus   Cortisol   Diet NPO time specified Except for: Sips with Meds   Cardiac monitoring   Document height and weight   Assess and Document Glasgow Coma Scale   Document vital signs within 1-hour of fluid bolus completion. Notify provider of abnormal vital signs despite fluid resuscitation.   DO NOT delay antibiotics if unable to obtain blood culture.   Refer to Sidebar Report: Sepsis Sidebar ED/IP   Notify provider for difficulties obtaining IV access.   Insert peripheral IV x 2   Initiate Carrier Fluid Protocol   Document height  and weight   Check Rectal Temperature   Central line insertion   Check temperature   Cardiac Monitoring - Continuous Indefinite   Apply Covid-19 Care Plan   Novel Coronavirus PPE supplies (droplet and contact precautions) yellow stethoscopes, surgical mask, gowns, surgical caps, face shield, goggles, CAPR - on the floor/unit, cleaning Sani[employer]oth (orange and purple top)   Place COVID-19 isolation sign and PPE checklist outside the DOOR   Do not give nonsteroidal anti-inflammatory drugs (NSAIDs)   Patient to wear surgical mask during  transportation   Place working phone next to the patient   Initiate Oral Care Protocol   Initiate Carrier Fluid Protocol   Apply Sepsis Care Plan   If lactate (lactic acid) >2, verify repeat lactic acid order has been placed to be drawn   Document vital signs within 1-hour of fluid bolus completion and notify provider of bolus completion   Vital signs   Vital signs   Assess and Document Glasgow Coma Scale   Cardiac Monitoring - Continuous Indefinite   Code Sepsis activation.  This occurs automatically when order is signed and prioritizes pharmacy, lab, and radiology services for STAT collections and interventions.  If [facility] downtime, call [facility] [phone]) to activate Code Sepsis.   Consult to hospitalist   aztreonam (AZACTAM) per pharmacy consult   vancomycin per pharmacy consult   Airborne and Contact precautions   Pulse oximetry, continuous   Incentive spirometry   Flutter valve   I-Stat venous blood gas, ([facility] ED, [facility], [facility])   I-stat chem 8, ED (not at [facility], [facility] or [facility])   EKG 12-Lead   EKG 12-Lead   Insert saline lock   Admit to Inpatient (patient's expected length of stay will be greater than 2 midnights or inpatient only procedure)     OTHER Significant initial  Findings:  labs showing:    Recent Labs  Lab 08/13/22 1417 08/13/22 1440 08/13/22 1814 08/13/22 2004  NA 144 144 144  --   K 7.9* 3.8 3.7  --   CO2  --  19* 20*  --   GLUCOSE  --  85 94  --   BUN  --  10 11  --   CREATININE  --  1.18* 1.19*  --   CALCIUM  --  7.9* 7.6*  --   MG  --   --   --  1.9  PHOS  --   --   --  3.6    Cr    Up from baseline see below Lab Results  Component Value Date   CREATININE 1.19 (H) 08/13/2022   CREATININE 1.18 (H) 08/13/2022   CREATININE 0.66 07/22/2022    Recent Labs  Lab 08/13/22 1440 08/13/22 1814  AST 19 18  ALT 14 13  ALKPHOS 98 93  BILITOT 0.9 1.2  PROT 4.9* 4.6*  ALBUMIN 1.7* 1.6*   Lab Results  Component Value Date   CALCIUM 7.6 (L) 08/13/2022    PHOS 3.6 08/13/2022        Plt: Lab Results  Component Value Date   PLT 334 08/13/2022    COVID-19 Labs  Recent Labs    08/13/22 2004  FERRITIN 315*  LDH 216*  CRP 10.9*    Lab Results  Component Value Date   SARSCOV2NAA POSITIVE (A) 08/13/2022   SARSCOV2NAA NEGATIVE 07/15/2022   SARSCOV2NAA NEGATIVE 05/17/2022   SARSCOV2NAA NEGATIVE 10/11/2019     Venous  Blood Gas result:  pH   7.429 Sodium 144 mmol/L  pCO2, Ven 33.0 Low  mmHg Potassium 7.9 High Panic  mmol/L  pO2, Ven 118 High  mmHg         Recent Labs  Lab 08/13/22 1417 08/13/22 1440  WBC  --  24.6*  NEUTROABS  --  21.6*  HGB 10.2* 8.5*  HCT 30.0* 24.7*  MCV  --  92.9  PLT  --  334    HG/HCT Down      Component Value Date/Time   HGB 8.5 (L) 08/13/2022 1440   HCT 24.7 (L) 08/13/2022 1440   MCV 92.9 08/13/2022 1440     No results for input(s): "LIPASE", "AMYLASE" in the last 168 hours. No results for input(s): "AMMONIA" in the last 168 hours.    Cardiac Panel (last 3 results) Recent Labs    08/13/22 1814  CKTOTAL 29*    .car BNP (last 3 results) Recent Labs    05/08/22 2023  BNP 19.7    Cultures:    Component Value Date/Time   SDES  07/16/2022 2126    BLOOD BLOOD RIGHT HAND Performed at [facility], [address] [place], [place] [address]    SDES  07/16/2022 2126    BLOOD BLOOD LEFT HAND Performed at [facility], [address], [place], [place] [address]    SPECREQUEST  07/16/2022 2126    IN PEDIATRIC BOTTLE Blood Culture adequate volume Performed at [facility], [address], [place], [place] [address]    SPECREQUEST  07/16/2022 2126    IN PEDIATRIC BOTTLE Blood Culture adequate volume Performed at [facility], [address] [place], [place] [address]    CULT  07/16/2022 2126    NO GROWTH 5 DAYS Performed at [facility], [address], [place], [place] [address]    CULT   07/16/2022 2126    NO GROWTH 5 DAYS Performed at [facility], [address], [place], [place] [address]    REPTSTATUS 07/21/2022 FINAL 07/16/2022 2126   REPTSTATUS 07/21/2022 FINAL 07/16/2022 2126     Radiological Exams on Admission: CT HEAD WO CONTRAST ( )  Result Date: 08/13/2022 CLINICAL DATA:  Mental status change, unknown cause EXAM: CT HEAD WITHOUT CONTRAST TECHNIQUE: Contiguous axial images were obtained from the base of the skull through the vertex without intravenous contrast. RADIATION DOSE REDUCTION: This exam was performed according to the departmental dose-optimization program which includes automated exposure control, adjustment of the mA and/or kV according to patient size and/or use of iterative reconstruction technique. COMPARISON:  CT head 07/15/2022. FINDINGS: Brain: No evidence of acute infarction, hemorrhage, hydrocephalus, extra-axial collection or mass lesion/mass effect. Vascular: No hyperdense vessel. Skull: No acute fracture. Sinuses/Orbits: Clear sinuses.  No acute orbital findings. Other: No mastoid effusions. IMPRESSION: No evidence of acute intracranial abnormality. Electronically Signed   By: Feliberto Harts M.D.   On: 08/13/2022 17:49   CT CHEST ABDOMEN PELVIS W CONTRAST  Result Date: 08/13/2022 CLINICAL DATA:  Sepsis fever, lactic acidosis EXAM: CT CHEST, ABDOMEN, AND PELVIS WITH CONTRAST TECHNIQUE: Multidetector CT imaging of the chest, abdomen and pelvis was performed following the standard protocol during bolus administration of intravenous contrast. RADIATION DOSE REDUCTION: This exam was performed according to the departmental dose-optimization program which includes automated exposure control, adjustment of the mA and/or kV according to patient size and/or use of iterative reconstruction technique. CONTRAST:  75mL OMNIPAQUE IOHEXOL 350 MG/ML SOLN COMPARISON:  None Available. FINDINGS: CT CHEST FINDINGS Cardiovascular: Calcific atherosclerosis of  the  aorta and coronary arteries. Normal heart size. No pericardial effusion. Mediastinum/Nodes: Moderate size hiatal hernia. Lungs/Pleura: Left lower lobe consolidation. No pleural effusions or pneumothorax. Musculoskeletal: Healing subacute left posterior eleventh rib fracture. CT ABDOMEN PELVIS FINDINGS Hepatobiliary: No focal liver abnormality is seen. Status post cholecystectomy. No biliary dilatation. Pancreas: Unremarkable. No pancreatic ductal dilatation or surrounding inflammatory changes. Spleen: Normal in size without focal abnormality. Adrenals/Urinary Tract: Adrenal glands are unremarkable. Kidneys are normal, without renal calculi, focal lesion, or hydronephrosis. Bladder is unremarkable. Small amount of ill-defined fluid adjacent to the right kidney, which appears similar. Stomach/Bowel: No evidence of bowel obstruction. Stool within the colon. No evidence of bowel wall thickening or inflammatory change. Vascular/Lymphatic: Aortic atherosclerosis. No enlarged abdominal or pelvic lymph nodes. Reproductive: Status post hysterectomy. No adnexal masses. Other: Stable chronic ovoid fluid collection along the anterior abdominal wall above the umbilicus. This is associated with a ventral mesh hernia repair. Similar adjacent calcified structure. Musculoskeletal: Degenerative changes, greatest in the lower lumbar spine. IMPRESSION: 1. Left lower lobe consolidation, suspicious for pneumonia and/or aspiration. 2. Healing subacute left posterior eleventh rib fracture. 3. Stable chronic fluid collection along the anterior mesh from prior ventral hernia repair. Moderately sized hiatal hernia. 4.  Aortic Atherosclerosis (ICD10-I70.0). Electronically Signed   By: Feliberto HartsFrederick S Jones M.D.   On: 08/13/2022 17:38   DG Chest Portable 1 View  Result Date: 08/13/2022 CLINICAL DATA:  Central line placement EXAM: PORTABLE CHEST 1 VIEW COMPARISON:  08/13/2022 at 2:01 p.m. FINDINGS: Right internal jugular central venous catheter  present with tip at the brachiocephalic confluence with the SVC. No pneumothorax. Atherosclerotic calcification of the aortic arch. Mild enlargement of the cardiopericardial silhouette noted with retrocardiac opacity compatible with moderate-sized hiatal hernia. IMPRESSION: 1. Right internal jugular central venous catheter tip is at the brachiocephalic confluence with the SVC. No pneumothorax. 2. Mild enlargement of the cardiopericardial silhouette. 3. Moderate-sized hiatal hernia. Electronically Signed   By: Gaylyn RongWalter  Liebkemann M.D.   On: 08/13/2022 16:10   DG Chest Port 1 View  Result Date: 08/13/2022 CLINICAL DATA:  Altered mental status.  Recent COVID pneumonia EXAM: PORTABLE CHEST 1 VIEW COMPARISON:  Chest radiograph 05/16/2022 FINDINGS: Stable cardiac and mediastinal contours. Rounded retrocardiac density. No pleural effusion or pneumothorax. Thoracic spine degenerative changes. IMPRESSION: There is a rounded retrocardiac density which is nonspecific and may represent the patient's hiatal hernia. Possibility of underlying pulmonary consolidation in the setting of infection not entirely excluded. Electronically Signed   By: Annia Beltrew  Davis M.D.   On: 08/13/2022 14:20   _______________________________________________________________________________________________________ Latest  Blood pressure (!) 153/61, pulse 88, temperature 98.4 F (36.9 C), temperature source Rectal, resp. rate 15, SpO2 97 %.   Vitals  labs and radiology finding personally reviewed  Review of Systems:    Pertinent positives include:   Fevers, chills, fatigue, confusion Constitutional:  No weight loss, night sweats, weight loss  HEENT:  No headaches, Difficulty swallowing,Tooth/dental problems,Sore throat,  No sneezing, itching, ear ache, nasal congestion, post nasal drip,  Cardio-vascular:  No chest pain, Orthopnea, PND, anasarca, dizziness, palpitations.no Bilateral lower extremity swelling  GI:  No heartburn, indigestion,  abdominal pain, nausea, vomiting, diarrhea, change in bowel habits, loss of appetite, melena, blood in stool, hematemesis Resp:  no shortness of breath at rest. No dyspnea on exertion, No excess mucus, no productive cough, No non-productive cough, No coughing up of blood.No change in color of mucus.No wheezing. Skin:  no rash or lesions. No jaundice GU:  no dysuria, change in  color of urine, no urgency or frequency. No straining to urinate.  No flank pain.  Musculoskeletal:  No joint pain or no joint swelling. No decreased range of motion. No back pain.  Psych:  No change in mood or affect. No depression or anxiety. No memory loss.  Neuro: no localizing neurological complaints, no tingling, no weakness, no double vision, no gait abnormality, no slurred speech, no   All systems reviewed and apart from HOPI all are negative _______________________________________________________________________________________________ Past Medical History:   Past Medical History:  Diagnosis Date   Hyperlipidemia    Hypertension    Prolonged QT interval 06/21/2022      Past Surgical History:  Procedure Laterality Date   ABDOMINAL HYSTERECTOMY     APPENDECTOMY     BIOPSY  05/17/2022   Procedure: BIOPSY;  Surgeon: Jeani Hawking, MD;  Location: [facility] ENDOSCOPY;  Service: Gastroenterology;;   CHOLECYSTECTOMY N/A 05/22/2022   Procedure: LAPAROSCOPIC CHOLECYSTECTOMY WITH INTRAOPERATIVE CHOLANGIOGRAM;  Surgeon: Abigail Miyamoto, MD;  Location: [facility] OR;  Service: General;  Laterality: N/A;   ESOPHAGOGASTRODUODENOSCOPY (EGD) WITH PROPOFOL N/A 05/17/2022   Procedure: ESOPHAGOGASTRODUODENOSCOPY (EGD) WITH PROPOFOL;  Surgeon: Jeani Hawking, MD;  Location: [facility] ENDOSCOPY;  Service: Gastroenterology;  Laterality: N/A;   ESOPHAGOGASTRODUODENOSCOPY (EGD) WITH PROPOFOL N/A 07/12/2022   Procedure: ESOPHAGOGASTRODUODENOSCOPY (EGD) WITH PROPOFOL;  Surgeon: Jeani Hawking, MD;  Location: [facility] ENDOSCOPY;  Service: Gastroenterology;   Laterality: N/A;   HERNIA REPAIR     TONSILLECTOMY     TUBAL LIGATION      Social History:  Ambulatory   independently      reports that she has been smoking cigarettes. She has been smoking an average of .75 packs per day. She has never used smokeless tobacco. She reports that she does not drink alcohol and does not use drugs.     Family History:  Family History  Problem Relation Age of Onset   Heart failure Mother    Cancer Father    Cancer Sister    ______________________________________________________________________________________________ Allergies: Allergies  Allergen Reactions   Fish Allergy Anaphylaxis   Morphine And Related Anaphylaxis and Swelling   Belsomra [Suvorexant] Other (See Comments)    Nightmares     Codeine Nausea And Vomiting   Duragesic-100 [Fentanyl] Hives   Motrin [Ibuprofen] Nausea Only and Other (See Comments)    History of stomach ulcers   Nsaids Other (See Comments)    History of stomach ulcers   Penicillins Hives    Did it involve swelling of the face/tongue/throat, SOB, or low BP? Yes Did it involve sudden or severe rash/hives, skin peeling, or any reaction on the inside of your mouth or nose? Yes Did you need to seek medical attention at a hospital or doctor's office? Yes When did it last happen? More than 10 years ago If all above answers are "NO", may proceed with cephalosporin use.    Sulfa Antibiotics Swelling    Not listed on MAR   Tolectin [Tolmetin] Other (See Comments)    History of stomach ulcers Not listed on MAR    Prior to Admission medications   Medication Sig Start Date End Date Taking? Authorizing Provider  ascorbic acid (VITAMIN C) 500 MG tablet Take 1 tablet (500 mg total) by mouth daily. Patient taking differently: Take 500 mg by mouth in the morning. (1000) 07/23/22  Yes Dahal, Melina Schools, MD  budesonide-formoterol (SYMBICORT) 160-4.5 MCG/ACT inhaler Inhale 2 puffs into the lungs every 12 (twelve) hours as needed  (wheezing).   Yes [provider]  carvedilol (COREG) 12.5 MG tablet Take 1 tablet (12.5 mg total) by mouth 2 (two) times daily with a meal. Patient taking differently: Take 12.5 mg by mouth 2 (two) times daily. (1000, 1800) 07/22/22  Yes Dahal, Melina Schools, MD  dorzolamide (TRUSOPT) 2 % ophthalmic solution Place 1 drop into both eyes 2 (two) times daily. (1000, 1800)   Yes [provider]  DULoxetine HCl 40 MG CSDR Take 40 mg by mouth in the morning. (1000)   Yes [provider]  Emollient (COLLAGEN EX) Apply 1 application  topically See admin instructions. Cleanse right knee, pat dry, apply collagen and cover with silicone dressing every Monday, Wednesday, Friday during day shift.   Yes [provider]  ferrous sulfate 325 (65 FE) MG tablet Take 325 mg by mouth 2 (two) times daily. (1000, 1800)   Yes [provider]  folic acid (FOLVITE) 1 MG tablet Take 1 tablet (1 mg total) by mouth daily. Patient taking differently: Take 1 mg by mouth in the morning. (1000) 07/14/22 08/13/22 Yes Ghimire, Lyndel Safe, MD  latanoprost (XALATAN) 0.005 % ophthalmic solution Place 1 drop into both eyes at bedtime. (2100)   Yes [provider]  losartan (COZAAR) 100 MG tablet Take 100 mg by mouth in the morning. (1000)   Yes [provider]  Multiple Vitamins-Minerals (MULTIVITAMIN WITH MINERALS) tablet Take 1 tablet by mouth in the morning. (1000)   Yes [provider]  Nutritional Supplements (ENSURE ORIGINAL) LIQD Take 1 each by mouth 2 (two) times daily. (1000, 1800)   Yes [provider]  ondansetron (ZOFRAN) 4 MG tablet Take 4 mg by mouth every 8 (eight) hours as needed for nausea or vomiting.   Yes [provider]  pantoprazole (PROTONIX) 40 MG tablet Take 40 mg by mouth 2 (two) times daily. (0600, 1600)   Yes [provider]  polyethylene glycol powder (GLYCOLAX/MIRALAX) 17 GM/SCOOP powder Take 1 cap full (17 g) with water by  mouth daily. Patient taking differently: Take 17 g by mouth in the morning. (1000) 05/24/22  Yes Zigmund Daniel., MD  potassium chloride (KLOR-CON) 10 MEQ tablet Take 10 mEq by mouth 2 (two) times daily. (0900, 1800)   Yes [provider]  tolterodine (DETROL) 2 MG tablet Take 2 mg by mouth in the morning. (1000)   Yes [provider]  torsemide (DEMADEX) 10 MG tablet Take 10 mg by mouth daily as needed (edema).   Yes [provider]  traMADol (ULTRAM) 50 MG tablet Take 50 mg by mouth every 8 (eight) hours as needed for severe pain or moderate pain.   Yes [provider]  Vitamin D, Ergocalciferol, (DRISDOL) 1.25 MG (50000 UNIT) CAPS capsule Take 1 capsule (50,000 Units total) by mouth every 7 (seven) days. Patient taking differently: Take 50,000 Units by mouth See admin instructions. 50,000 units once a week on Tuesday (1000) 07/24/22  Yes Dahal, Melina Schools, MD  levofloxacin (LEVAQUIN) 750 MG tablet Take 750 mg by mouth daily. 7 day course. 08/01/22-08/07/22 Patient not taking: Reported on 08/13/2022    [provider]  molnupiravir EUA (LAGEVRIO) 200 MG CAPS capsule Take 4 capsules by mouth 2 (two) times daily. 5 day course. 08/01/22-08/05/22 Patient not taking: Reported on 08/13/2022    [provider]   ___________________________ Physical Exam:    08/13/2022    6:45 PM 08/13/2022    6:30 PM 08/13/2022    6:20 PM  Vitals with BMI  Systolic 153 111 725  Diastolic 61 65 46  Pulse 88 81 87     1. General:  in No  Acute distress   Chronically ill  l -appearing 2. Psychological: Alert and   Oriented to self 3. Head/ENT: Dry Mucous Membranes                          Head Non traumatic, neck supple                       Poor Dentition 4. SKIN:  decreased Skin turgor,  Skin clean Dry and intact no rash 5. Heart: Regular rate and rhythm no  Murmur, no Rub or gallop 6. Lungs , no wheezes crackles  on the left 7. Abdomen: Soft,epigastric -tender,  Non distended   obese  bowel sounds present 8. Lower extremities: no clubbing, cyanosis, no  edema 9. Neurologically Grossly intact, moving all 4 extremities equally   10. MSK: Normal range of motion    Chart has been reviewed  __________________________________________________________________________  Assessment/Plan 72 y.o. female with medical history significant of HTN, HLD, chronic abdominal pain, anxiety, bipolar disorder,  cholecystectomy  Chronic abdominal pain with pancreatitis and esophagitis   Admitted for   COVID-19  Pneumonia of left lower lobe due to infectious organism Sepsis without acute organ dysfunction, due to unspecified organism     Present on Admission:  CAP (community acquired pneumonia)  Hypertension  AKI (acute kidney injury) (HCC)  Anemia  Bipolar disorder, in full remission, most recent episode mixed (HCC)  Acute metabolic encephalopathy  Severe sepsis (HCC)  Tobacco abuse     CAP (community acquired pneumonia)  - -Patient presenting with  productive cough, fever    hypoxia  , and infiltrate in   lower lobe on chest x-ray -Infiltrate on CXR and 2-3 characteristics (fever, leukocytosis, purulent sputum) are consistent with pneumonia. -This appears to be most likely community-acquired pneumonia.   -  Suspect aspiration given decreased mental status      will admit for treatment of CAP will start on appropriate antibiotic coverage. -Aztreonam,azithromycin vanc   Obtain:  sputum cultures,                  influenza serologies negative                  COVID PCR positive after a recent infeciton Post viral bacterial pneumonia is waiting                  blood cultures and sputum cultures ordered                   strep pneumo UA antigen,                    check for Legionella antigen.                Provide oxygen as needed.    Hypertension Allow permissive hypertension given hypotension earlier today  AKI (acute kidney injury) (HCC) Obtain  electrolytes and rehydrate follow renal function  Anemia Obtain anemia panel Hemoccult stool and transfuse as needed for hemoglobin below 7 or rapidly dropping  Bipolar disorder, in full remission, most recent episode mixed (HCC) Resume home medicines when able to tolerate  Acute metabolic encephalopathy In the setting of significant infection.  Will rehydrate treat underlying infection.  - most likely multifactorial secondary to combination of  infection   dehydration secondary to  decreased by mouth intake,  polypharmacy   - Will rehydrate   - treat underlining infection   - Hold contributing medications   - if no improvement may need further imaging to evaluate for CNS pathology pathology such as MRI of the brain   - neurological exam appears to be nonfocal but patient unable to cooperate fully   - VBG unremarkable no evidence of hypercarbia      Severe sepsis (HCC)  -SIRS criteria met with  elevated white blood cell count,       Component Value Date/Time   WBC 24.6 (H) 08/13/2022 1440   LYMPHSABS 1.0 08/13/2022 1440    tachycardia   ,   fever   RR >20 Today's Vitals   08/13/22 1815 08/13/22 1820 08/13/22 1830 08/13/22 1845  BP: 94/67 (!) 124/46 111/65 (!) 153/61  Pulse: 87 87 81 88  Resp: 17 18 18 15   Temp:      TempSrc:      SpO2: 98% 100% 95% 97%   There is no height or weight on file to calculate BMI.   The recent clinical data is shown below. Vitals:   08/13/22 1815 08/13/22 1820 08/13/22 1830 08/13/22 1845  BP: 94/67 (!) 124/46 111/65 (!) 153/61  Pulse: 87 87 81 88  Resp: 17 18 18 15   Temp:      TempSrc:      SpO2: 98% 100% 95% 97%     -Most likely source being: pulmonary,      Patient meeting criteria for Severe sepsis with    evidence of end organ damage/organ dysfunction such as      elevated lactic acid >2     Component Value Date/Time   LATICACIDVEN 4.9 (HH) 08/13/2022 1440    acute metabolic encephalopathy  SBP<90 mmhg or MAP < 65 mmhg,    Acute hypoxia requiring new supplemental oxygen, SpO2: 97 %       Patient is meeting criteria for SEPTIC SHOCK with  lactic acid > 4 or multiple SBP readings below 90 mmhg or MAP reading below 65 mmhg   - Obtain serial lactic acid and procalcitonin level.  - Initiated IV antibiotics in ER: Antibiotics Given (last 72 hours)     Date/Time Action Medication Dose Rate   08/13/22 1432 New Bag/Given   aztreonam (AZACTAM) 2 g in sodium chloride 0.9 % 100 mL IVPB 2 g 200 mL/hr   08/13/22 1529 New Bag/Given   vancomycin (VANCOREADY) IVPB 1500 mg/300 mL 1,500 mg 150 mL/hr   08/13/22 1700 New Bag/Given   metroNIDAZOLE (FLAGYL) IVPB 500 mg 500 mg 100 mL/hr       Will continue  on : Estring on vancomycin and azithromycin  - await results of blood and urine culture  - Rehydrate aggressively  Intravenous fluids were administered,          30cc/kg fluid   7:36 PM   Tobacco abuse  - Spoke about importance of quitting spent 5 minutes discussing options for treatment, prior attempts at quitting, and dangers of smoking  -At this point patient is   NOT  interested in quitting  - order nicotine patch   - nursing tobacco cessation protocol   Other plan as per orders.  DVT prophylaxis:  SCD     Code Status: DNR/DNI  as per patient and papperwork I had personally discussed CODE STATUS with patient and family    Family Communication:   Family not at  Bedside  ER attempted to call  as well Called son on the phone  Disposition Plan:     Back to current facility when stable                                Following barriers for discharge:                            Electrolytes corrected                               Anemia stable                                             Would benefit from PT/OT eval prior to DC  Ordered                   Swallow eval - SLP ordered                                     Transition of care consulted                   Nutrition    consulted                                      Palliative care    consulted                  Consults called: none  Admission status:  ED Disposition     ED Disposition  Admit   Condition  --   Comment  [facility][id number][facility]  Level of Care: Progressive [102]  Admit to Progressive based on following criteria: RESPIRATORY PROBLEMS hypoxemic/hypercapnic respiratory failure that is responsive to NIPPV (BiPAP) or High Flow Nasal Cannula (6-80 lpm). Frequent assessment/intervention, no > Q2 hrs < Q4 hrs, to maintain oxygenation and pulmonary hygiene.  [facility] if equivalent level of care is available:: No  Covid Evaluation: Confirmed COVID Positive  Diagnosis: CAP (community acquired pneumonia) [[id number]][[id number]]  Admitting Physician: Therisa DoyneUTOVA, Deunta Beneke [[id number]]  Attending Physician: Therisa DoyneUTOVA, Mishelle Hassan [[id number]]  Certification:: I certify this patient will need inpatient services for at least 2 midnights  Estimated Length of Stay: 2          inpatient     I Expect 2 midnight stay secondary to severity of patient's current illness need for inpatient interventions justified by the following:  hemodynamic instability despite optimal treatment (tachycardia  hypotension  hypoxia )  Severe lab/radiological/exam abnormalities including:    Sepsis, CAp and extensive comorbidities including:  CHF COPD/asthma   That are currently affecting medical management.   I expect  patient to be hospitalized for 2 midnights requiring inpatient medical care.  Patient is at high risk for adverse outcome (such as loss of life or disability) if not treated.  Indication for inpatient stay as follows:  Severe change from baseline regarding mental status Hemodynamic instability despite maximal medical therapy,    ,  inability to maintain oral hydration   New  or worsening hypoxia   Need for IV antibiotics, IV fluids,      Level of care         progressive tele  indefinitely please discontinue once patient no longer qualifies COVID-19 Labs    Lab Results  Component Value Date   SARSCOV2NAA POSITIVE (A) 08/13/2022     Precautions: admitted as  covid positive Airborne and Contact precautions   Critical   Patient is critically ill due to  hemodynamic instability  respiratory failure  severe sepsis  They are at high risk for life/limb threatening clinical deterioration requiring frequent reassessment and modifications of care.  Services provided include examination of the patient, review of relevant ancillary tests, prescription of lifesaving therapies, review of medications and prophylactic therapy.  Total critical care time excluding separately billable procedures: 60 Minutes.    Fenris Cauble 08/13/2022, 10:55 PM    [facility]     after 2 AM please page floor coverage PA If 7AM-7PM, please contact the day team taking care of the patient using [email or web address]   Patient was evaluated in the context of the global COVID-19 pandemic, which necessitated consideration that the patient might be at risk for infection with the SARS-CoV-2 virus that causes COVID-19. Institutional protocols and algorithms that pertain to the evaluation of patients at risk for COVID-19 are in a state of rapid change based on information released by regulatory bodies including the CDC and federal and state organizations. These policies and algorithms were followed during the patient's care.

## 2022-08-13 NOTE — ED Notes (Signed)
Janeece Fitting PA, shown results of I[facility] VBG. ED-Lab.

## 2022-08-13 NOTE — Assessment & Plan Note (Signed)
Obtain anemia panel Hemoccult stool and transfuse as needed for hemoglobin below 7 or rapidly dropping

## 2022-08-13 NOTE — Sepsis Progress Note (Signed)
eLink is following this Code Sepsis. °

## 2022-08-13 NOTE — ED Provider Notes (Signed)
[facility] EMERGENCY DEPARTMENT Provider Note   CSN: [id number] Arrival date & time: 08/13/22  1327     History HTN Chief Complaint  Patient presents with   Altered Mental Status    Stephanie Benton is a 72 y.o. female.  72 year old female with a past medical history of HTN, prolonged QT, presents to the ED via EMS for altered mental status.  Patient picked up from [address], has had change in mental status over the last couple of days.  They noted her blood pressure to be around the 60s systolic while in route, she is also febrile with an axillary temp of 102.1, tachycardic and tachypneic.  She is DNR according to records viewed on arrival.  Level 5 caveat due to AMS.  The history is provided by medical records and the EMS personnel.  Altered Mental Status Presenting symptoms: disorientation and lethargy        Home Medications Prior to Admission medications   Medication Sig Start Date End Date Taking? Authorizing Provider  ascorbic acid (VITAMIN C) 500 MG tablet Take 1 tablet (500 mg total) by mouth daily. Patient taking differently: Take 500 mg by mouth in the morning. (1000) 07/23/22  Yes Dahal, Melina Schools, MD  budesonide-formoterol (SYMBICORT) 160-4.5 MCG/ACT inhaler Inhale 2 puffs into the lungs every 12 (twelve) hours as needed (wheezing).   Yes [provider]  carvedilol (COREG) 12.5 MG tablet Take 1 tablet (12.5 mg total) by mouth 2 (two) times daily with a meal. Patient taking differently: Take 12.5 mg by mouth 2 (two) times daily. (1000, 1800) 07/22/22  Yes Dahal, Melina Schools, MD  dorzolamide (TRUSOPT) 2 % ophthalmic solution Place 1 drop into both eyes 2 (two) times daily. (1000, 1800)   Yes [provider]  DULoxetine HCl 40 MG CSDR Take 40 mg by mouth in the morning. (1000)   Yes [provider]  Emollient (COLLAGEN EX) Apply 1 application  topically See admin instructions. Cleanse right knee, pat dry, apply  collagen and cover with silicone dressing every Monday, Wednesday, Friday during day shift.   Yes [provider]  ferrous sulfate 325 (65 FE) MG tablet Take 325 mg by mouth 2 (two) times daily. (1000, 1800)   Yes [provider]  folic acid (FOLVITE) 1 MG tablet Take 1 tablet (1 mg total) by mouth daily. Patient taking differently: Take 1 mg by mouth in the morning. (1000) 07/14/22 08/13/22 Yes Ghimire, Lyndel Safe, MD  latanoprost (XALATAN) 0.005 % ophthalmic solution Place 1 drop into both eyes at bedtime. (2100)   Yes [provider]  losartan (COZAAR) 100 MG tablet Take 100 mg by mouth in the morning. (1000)   Yes [provider]  Multiple Vitamins-Minerals (MULTIVITAMIN WITH MINERALS) tablet Take 1 tablet by mouth in the morning. (1000)   Yes [provider]  Nutritional Supplements (ENSURE ORIGINAL) LIQD Take 1 each by mouth 2 (two) times daily. (1000, 1800)   Yes [provider]  ondansetron (ZOFRAN) 4 MG tablet Take 4 mg by mouth every 8 (eight) hours as needed for nausea or vomiting.   Yes [provider]  pantoprazole (PROTONIX) 40 MG tablet Take 40 mg by mouth 2 (two) times daily. (0600, 1600)   Yes [provider]  polyethylene glycol powder (GLYCOLAX/MIRALAX) 17 GM/SCOOP powder Take 1 cap full (17 g) with water by mouth daily. Patient taking differently: Take 17 g by mouth in the morning. (1000) 05/24/22  Yes Zigmund Daniel.,  MD  potassium chloride (KLOR-CON) 10 MEQ tablet Take 10 mEq by mouth 2 (two) times daily. (0900, 1800)   Yes [provider]  tolterodine (DETROL) 2 MG tablet Take 2 mg by mouth in the morning. (1000)   Yes [provider]  torsemide (DEMADEX) 10 MG tablet Take 10 mg by mouth daily as needed (edema).   Yes [provider]  traMADol (ULTRAM) 50 MG tablet Take 50 mg by mouth every 8 (eight) hours as needed for severe pain or moderate pain.   Yes [provider]   Vitamin D, Ergocalciferol, (DRISDOL) 1.25 MG (50000 UNIT) CAPS capsule Take 1 capsule (50,000 Units total) by mouth every 7 (seven) days. Patient taking differently: Take 50,000 Units by mouth See admin instructions. 50,000 units once a week on Tuesday (1000) 07/24/22  Yes Dahal, Melina Schools, MD  levofloxacin (LEVAQUIN) 750 MG tablet Take 750 mg by mouth daily. 7 day course. 08/01/22-08/07/22 Patient not taking: Reported on 08/13/2022    [provider]  molnupiravir EUA (LAGEVRIO) 200 MG CAPS capsule Take 4 capsules by mouth 2 (two) times daily. 5 day course. 08/01/22-08/05/22 Patient not taking: Reported on 08/13/2022    [provider]      Allergies    Fish allergy, Morphine and related, Belsomra [suvorexant], Codeine, Duragesic-100 [fentanyl], Motrin [ibuprofen], Nsaids, Penicillins, Sulfa antibiotics, and Tolectin [tolmetin]    Review of Systems   Review of Systems  Unable to perform ROS: Mental status change    Physical Exam Updated Vital Signs BP 94/67   Pulse 87   Temp 98.4 F (36.9 C) (Rectal)   Resp 17   SpO2 98%  Physical Exam Vitals and nursing note reviewed.  Constitutional:      Appearance: She is ill-appearing.  HENT:     Head: Normocephalic and atraumatic.     Nose: Nose normal.     Mouth/Throat:     Mouth: Mucous membranes are dry.  Eyes:     Pupils: Pupils are equal, round, and reactive to light.  Cardiovascular:     Rate and Rhythm: Tachycardia present.  Pulmonary:     Effort: Pulmonary effort is normal.     Breath sounds: Rhonchi present.  Abdominal:     General: Abdomen is flat.     Palpations: Abdomen is soft.     Tenderness: There is no abdominal tenderness.  Musculoskeletal:     Cervical back: Normal range of motion and neck supple.  Skin:    General: Skin is warm and dry.  Neurological:     Mental Status: She is disoriented.     ED Results / Procedures / Treatments   Labs (all labs ordered are listed, but only abnormal results  are displayed) Labs Reviewed  RESP PANEL BY RT-PCR (RSV, FLU A&B, COVID)  RVPGX2 - Abnormal; Notable for the following components:      Result Value   SARS Coronavirus 2 by RT PCR POSITIVE (*)    All other components within normal limits  LACTIC ACID, PLASMA - Abnormal; Notable for the following components:   Lactic Acid, Venous 4.9 (*)    All other components within normal limits  COMPREHENSIVE METABOLIC PANEL - Abnormal; Notable for the following components:   Chloride 116 (*)    CO2 19 (*)    Creatinine, Ser 1.18 (*)    Calcium 7.9 (*)    Total Protein 4.9 (*)    Albumin 1.7 (*)    GFR, Estimated 49 (*)    All  other components within normal limits  CBC WITH DIFFERENTIAL/PLATELET - Abnormal; Notable for the following components:   WBC 24.6 (*)    RBC 2.66 (*)    Hemoglobin 8.5 (*)    HCT 24.7 (*)    RDW 18.9 (*)    Neutro Abs 21.6 (*)    Monocytes Absolute 1.5 (*)    Abs Immature Granulocytes 0.46 (*)    All other components within normal limits  PROTIME-INR - Abnormal; Notable for the following components:   Prothrombin Time 17.3 (*)    INR 1.4 (*)    All other components within normal limits  APTT - Abnormal; Notable for the following components:   aPTT 54 (*)    All other components within normal limits  I-STAT VENOUS BLOOD GAS, ED - Abnormal; Notable for the following components:   pCO2, Ven 33.0 (*)    pO2, Ven 118 (*)    Potassium 7.9 (*)    Calcium, Ion 1.03 (*)    HCT 30.0 (*)    Hemoglobin 10.2 (*)    All other components within normal limits  CULTURE, BLOOD (ROUTINE X 2)  CULTURE, BLOOD (ROUTINE X 2)  LACTIC ACID, PLASMA  URINALYSIS, ROUTINE W REFLEX MICROSCOPIC  COMPREHENSIVE METABOLIC PANEL  CK  PROCALCITONIN  PROCALCITONIN  I-STAT CHEM 8, ED    EKG EKG Interpretation  Date/Time:  Tuesday August 13 2022 13:50:49 EST Ventricular Rate:  107 PR Interval:  130 QRS Duration: 79 QT Interval:  357 QTC Calculation: 477 R Axis:   55 Text  Interpretation: Sinus tachycardia Probable anteroseptal infarct, old when comapred to prior, faster rate. No STEMI Confirmed by Theda Belfast ([id number]) on 08/13/2022 2:57:29 PM  Radiology CT HEAD WO CONTRAST ( )  Result Date: 08/13/2022 CLINICAL DATA:  Mental status change, unknown cause EXAM: CT HEAD WITHOUT CONTRAST TECHNIQUE: Contiguous axial images were obtained from the base of the skull through the vertex without intravenous contrast. RADIATION DOSE REDUCTION: This exam was performed according to the departmental dose-optimization program which includes automated exposure control, adjustment of the mA and/or kV according to patient size and/or use of iterative reconstruction technique. COMPARISON:  CT head 07/15/2022. FINDINGS: Brain: No evidence of acute infarction, hemorrhage, hydrocephalus, extra-axial collection or mass lesion/mass effect. Vascular: No hyperdense vessel. Skull: No acute fracture. Sinuses/Orbits: Clear sinuses.  No acute orbital findings. Other: No mastoid effusions. IMPRESSION: No evidence of acute intracranial abnormality. Electronically Signed   By: Feliberto Harts M.D.   On: 08/13/2022 17:49   CT CHEST ABDOMEN PELVIS W CONTRAST  Result Date: 08/13/2022 CLINICAL DATA:  Sepsis fever, lactic acidosis EXAM: CT CHEST, ABDOMEN, AND PELVIS WITH CONTRAST TECHNIQUE: Multidetector CT imaging of the chest, abdomen and pelvis was performed following the standard protocol during bolus administration of intravenous contrast. RADIATION DOSE REDUCTION: This exam was performed according to the departmental dose-optimization program which includes automated exposure control, adjustment of the mA and/or kV according to patient size and/or use of iterative reconstruction technique. CONTRAST:  85mL OMNIPAQUE IOHEXOL 350 MG/ML SOLN COMPARISON:  None Available. FINDINGS: CT CHEST FINDINGS Cardiovascular: Calcific atherosclerosis of the aorta and coronary arteries. Normal heart size. No pericardial  effusion. Mediastinum/Nodes: Moderate size hiatal hernia. Lungs/Pleura: Left lower lobe consolidation. No pleural effusions or pneumothorax. Musculoskeletal: Healing subacute left posterior eleventh rib fracture. CT ABDOMEN PELVIS FINDINGS Hepatobiliary: No focal liver abnormality is seen. Status post cholecystectomy. No biliary dilatation. Pancreas: Unremarkable. No pancreatic ductal dilatation or surrounding inflammatory changes. Spleen: Normal in size without focal abnormality. Adrenals/Urinary  Tract: Adrenal glands are unremarkable. Kidneys are normal, without renal calculi, focal lesion, or hydronephrosis. Bladder is unremarkable. Small amount of ill-defined fluid adjacent to the right kidney, which appears similar. Stomach/Bowel: No evidence of bowel obstruction. Stool within the colon. No evidence of bowel wall thickening or inflammatory change. Vascular/Lymphatic: Aortic atherosclerosis. No enlarged abdominal or pelvic lymph nodes. Reproductive: Status post hysterectomy. No adnexal masses. Other: Stable chronic ovoid fluid collection along the anterior abdominal wall above the umbilicus. This is associated with a ventral mesh hernia repair. Similar adjacent calcified structure. Musculoskeletal: Degenerative changes, greatest in the lower lumbar spine. IMPRESSION: 1. Left lower lobe consolidation, suspicious for pneumonia and/or aspiration. 2. Healing subacute left posterior eleventh rib fracture. 3. Stable chronic fluid collection along the anterior mesh from prior ventral hernia repair. Moderately sized hiatal hernia. 4.  Aortic Atherosclerosis (ICD10-I70.0). Electronically Signed   By: Feliberto Harts M.D.   On: 08/13/2022 17:38   DG Chest Portable 1 View  Result Date: 08/13/2022 CLINICAL DATA:  Central line placement EXAM: PORTABLE CHEST 1 VIEW COMPARISON:  08/13/2022 at 2:01 p.m. FINDINGS: Right internal jugular central venous catheter present with tip at the brachiocephalic confluence with the SVC.  No pneumothorax. Atherosclerotic calcification of the aortic arch. Mild enlargement of the cardiopericardial silhouette noted with retrocardiac opacity compatible with moderate-sized hiatal hernia. IMPRESSION: 1. Right internal jugular central venous catheter tip is at the brachiocephalic confluence with the SVC. No pneumothorax. 2. Mild enlargement of the cardiopericardial silhouette. 3. Moderate-sized hiatal hernia. Electronically Signed   By: Gaylyn Rong M.D.   On: 08/13/2022 16:10   DG Chest Port 1 View  Result Date: 08/13/2022 CLINICAL DATA:  Altered mental status.  Recent COVID pneumonia EXAM: PORTABLE CHEST 1 VIEW COMPARISON:  Chest radiograph 05/16/2022 FINDINGS: Stable cardiac and mediastinal contours. Rounded retrocardiac density. No pleural effusion or pneumothorax. Thoracic spine degenerative changes. IMPRESSION: There is a rounded retrocardiac density which is nonspecific and may represent the patient's hiatal hernia. Possibility of underlying pulmonary consolidation in the setting of infection not entirely excluded. Electronically Signed   By: Annia Belt M.D.   On: 08/13/2022 14:20    Procedures Procedures    Medications Ordered in ED Medications  lactated ringers infusion (has no administration in time range)  lactated ringers infusion ( Intravenous New Bag/Given 08/13/22 1655)  lactated ringers bolus 1,000 mL (0 mLs Intravenous Stopped 08/13/22 1502)    And  lactated ringers bolus 1,000 mL (0 mLs Intravenous Stopped 08/13/22 1626)    And  lactated ringers bolus 500 mL (0 mLs Intravenous Stopped 08/13/22 1655)  aztreonam (AZACTAM) 2 g in sodium chloride 0.9 % 100 mL IVPB (0 g Intravenous Stopped 08/13/22 1626)  metroNIDAZOLE (FLAGYL) IVPB 500 mg (500 mg Intravenous New Bag/Given 08/13/22 1700)  acetaminophen (TYLENOL) suppository 650 mg (650 mg Rectal Given 08/13/22 1412)  vancomycin (VANCOREADY) IVPB 1500 mg/300 mL (1,500 mg Intravenous New Bag/Given 08/13/22 1529)  iohexol (OMNIPAQUE)  350 MG/ML injection 75 mL (75 mLs Intravenous Contrast Given 08/13/22 1727)    ED Course/ Medical Decision Making/ A&P Clinical Course as of 08/13/22 1836  Tue Aug 13, 2022  1552 Lactic Acid, Venous(!!): 4.9 Abx of  [JS]  1754 SARS Coronavirus 2 by RT PCR(!): POSITIVE [JS]    Clinical Course User Index [JS] Claude Manges, PA-C                           Medical Decision Making Amount and/or Complexity of  Data Reviewed Labs: ordered. Decision-making details documented in ED Course. Radiology: ordered. ECG/medicine tests: ordered.  Risk OTC drugs. Prescription drug management. Decision regarding hospitalization.  This patient presents to the ED for concern of AMS, this involves a number of treatment options, and is a complaint that carries with it a high risk of complications and morbidity.  The differential diagnosis CVA, ACS, sepsis.    Co morbidities: Discussed in HPI   Brief History:  Patient here with underlying high blood pressure for altered mental status brought in by [facility].  According to them they picked her up from [facility], where she currently lives alone.  Recent admission and discharged from the hospital approximately 1 month ago for with her meal along with sepsis, likely septic shock.  Unable to provide any history from patient.  EMR reviewed including pt PMHx, past surgical history and past visits to ER.   See HPI for more details   Lab Tests:  I ordered and independently interpreted labs.  The pertinent results include:    Labs notable for CBC a leukocytosis of 24.6, hemoglobin is at his baseline. CMP with no electrolyte derangement, her creatinine is at her baseline.  PT and INR is elevated, she is not anticoagulated.  Lactic acid was 4.9, IV antibiotics were started for an unknown source sepsis workup.  Imaging Studies:  Xray of the chest showed: There is a rounded retrocardiac density which is nonspecific and may  represent the patient's  hiatal hernia. Possibility of underlying  pulmonary consolidation in the setting of infection not entirely  excluded.   CT Head showed no acute findings.  CT CHEST/ABD/PELVIS: 1. Left lower lobe consolidation, suspicious for pneumonia and/or  aspiration.  2. Healing subacute left posterior eleventh rib fracture.  3. Stable chronic fluid collection along the anterior mesh from  prior ventral hernia repair.  Moderately sized hiatal hernia.  4.  Aortic Atherosclerosis (ICD10-I70.0).    Cardiac Monitoring:  The patient was maintained on a cardiac monitor.  I personally viewed and interpreted the cardiac monitored which showed an underlying rhythm of: Sinus tachycardia EKG non-ischemic   Medicines ordered:  I ordered medication including flagyl,vancomycin  for unknown source Reevaluation of the patient after these medicines showed that the patient improved I have reviewed the patients home medicines and have made adjustments as needed   Critical Interventions:  Central line placement by my attending Dr. Silverio Lay.  Reevaluation:  2:03 PM  Patient reevaluated blood pressure is 115/91, she seems to be slightly more responsive acknowledging me in the room. Call placed to her sister Rudi Rummage who reports she does not live with patient and does not have any history to provide at this time.  Multiple calls to her son who also did not reply.   Social Determinants of Health:  The patient's social determinants of health were a factor in the care of this patient  Problem List / ED Course:  Patient here with arrival from [facility] where she currently lives alone hypotensive, tachycardic, hypoxic and placed on a nonrebreather.  Recent admission for septic shock without underlying source, but was treated for diarrheal disease no source was ever found and she was dispositioned home.  Today patient is overall ill-appearing, sepsis protocol was applied early on.  She is febrile with a  temperature of 102.  Respiratory panel is positive for COVID-19.  CBC with a leukocytosis of 26,000, CMP with no electrolyte derangement, creatinine level slightly elevated but her LFTs are within normal  limits.  Lactic acid that was obtained was 4.9, antibiotics have been started for her.  X-ray of her chest shows some concern for consolidation.  Patient continued to be hypotensive despite multiple attempts at resuscitation, therefore a central line was placed by my attending Dr. Grayce Sessions.  Patient received adequate resuscitation with 3 L of lactated Ringer's, Tylenol suppository was also given for her pyrexia.  CT chest abdomen and pelvis was performed with concerns for left lower lobe pneumonia.  I do feel that patient meets criteria for admission at this time, however does not require critical care resources as her blood pressure has now improved to 112/72. She is slightly more alert.  Patient does have a DNR paperwork at the bedside.  Spoke to Dr. Roel Cluck who agrees with admission for further management of her covid 19 pneumonia. Patient stable for admission.   Dispostion:  After consideration of the diagnostic results and the patients response to treatment, I feel that the patent would benefit from admission for further management.   Portions of this note were generated with Lobbyist. Dictation errors may occur despite best attempts at proofreading.   Final Clinical Impression(s) / ED Diagnoses Final diagnoses:  COVID-19  Pneumonia of left lower lobe due to infectious organism  Sepsis without acute organ dysfunction, due to unspecified organism [facility])    Rx / DC Orders ED Discharge Orders     None         Janeece Fitting, PA-C 08/13/22 1836    Tegeler, Gwenyth Allegra, MD 08/14/22 1052

## 2022-08-13 NOTE — Assessment & Plan Note (Signed)
Resume home medicines when able to tolerate °

## 2022-08-13 NOTE — Assessment & Plan Note (Signed)
- -  Patient presenting with  productive cough, fever    hypoxia  , and infiltrate in   lower lobe on chest x-ray -Infiltrate on CXR and 2-3 characteristics (fever, leukocytosis, purulent sputum) are consistent with pneumonia. -This appears to be most likely community-acquired pneumonia.   -  Suspect aspiration given decreased mental status      will admit for treatment of CAP will start on appropriate antibiotic coverage. -Aztreonam,azithromycin vanc   Obtain:  sputum cultures,                  influenza serologies negative                  COVID PCR positive after a recent infeciton Post viral bacterial pneumonia is waiting                  blood cultures and sputum cultures ordered                   strep pneumo UA antigen,                    check for Legionella antigen.                Provide oxygen as needed.

## 2022-08-13 NOTE — Progress Notes (Signed)
Pharmacy Antibiotic Note  Stephanie Benton is a 72 y.o. female for which pharmacy has been consulted for vancomycin and aztreonam dosing for pneumonia and sepsis.  Patient with a history of  HTN, HLD, chronic abdominal pain, anxiety, bipolar disorder,  cholecystectomy  Chronic abdominal pain with pancreatitis and esophagitis. Patient presenting with confusion.  SCr 1.19 WBC 24.6; LA 4.9>1; T 102.1>98.4; HR 92; RR 25 COVID Pos / flu neg  Plan: Azithromycin per MD Aztreonam 2g q8hr Vancomycin 1500 mg once then 750 mg q24hr (eAUC 485.9) unless change in renal function Trend WBC, Fever, Renal function F/u cultures, clinical course, WBC De-escalate when able Levels at steady state     Temp (24hrs), Avg:102.1 F (38.9 C), Min:102.1 F (38.9 C), Max:102.1 F (38.9 C)  No results for input(s): "WBC", "CREATININE", "LATICACIDVEN", "VANCOTROUGH", "VANCOPEAK", "VANCORANDOM", "GENTTROUGH", "GENTPEAK", "GENTRANDOM", "TOBRATROUGH", "TOBRAPEAK", "TOBRARND", "AMIKACINPEAK", "AMIKACINTROU", "AMIKACIN" in the last 168 hours.  CrCl cannot be calculated (Patient's most recent lab result is older than the maximum 21 days allowed.).    Allergies  Allergen Reactions   Fish-Derived Products Anaphylaxis   Morphine And Related Anaphylaxis and Swelling   Belsomra [Suvorexant] Other (See Comments)    Caused nightmares   Codeine Nausea And Vomiting   Fentanyl Hives   Ibuprofen Nausea Only and Other (See Comments)    Severe nausea- history of stomach ulcers   Nsaids Other (See Comments)    History of stomach ulcers   Other Other (See Comments)    History of stomach ulcers   Penicillins Hives    Did it involve swelling of the face/tongue/throat, SOB, or low BP? Yes Did it involve sudden or severe rash/hives, skin peeling, or any reaction on the inside of your mouth or nose? Yes Did you need to seek medical attention at a hospital or doctor's office? Yes When did it last happen? More than 10 years  ago If all above answers are "NO", may proceed with cephalosporin use.    Sulfa Antibiotics Swelling    Site of swelling not recalled   Tolmetin Other (See Comments)    History of stomach ulcers    Antimicrobials this admission: Flagyl given in ED 1/9 Azithromycin 1/9 >>  aztreonam 1/9 >>  vancomycin 1/9 >>   Microbiology results: Pending  Thank you for allowing pharmacy to be a part of this patient's care.  Lorelei Pont, PharmD, BCPS 08/13/2022 1:51 PM ED Clinical Pharmacist -  [phone]

## 2022-08-13 NOTE — Assessment & Plan Note (Signed)
Obtain electrolytes and rehydrate follow renal function

## 2022-08-13 NOTE — Assessment & Plan Note (Signed)
Allow permissive hypertension given hypotension earlier today

## 2022-08-14 DIAGNOSIS — Z66 Do not resuscitate: Secondary | ICD-10-CM

## 2022-08-14 DIAGNOSIS — G9341 Metabolic encephalopathy: Secondary | ICD-10-CM | POA: Diagnosis not present

## 2022-08-14 DIAGNOSIS — Z7189 Other specified counseling: Secondary | ICD-10-CM | POA: Diagnosis not present

## 2022-08-14 DIAGNOSIS — N179 Acute kidney failure, unspecified: Secondary | ICD-10-CM | POA: Diagnosis not present

## 2022-08-14 DIAGNOSIS — U071 COVID-19: Secondary | ICD-10-CM | POA: Diagnosis not present

## 2022-08-14 DIAGNOSIS — Z515 Encounter for palliative care: Secondary | ICD-10-CM | POA: Diagnosis not present

## 2022-08-14 DIAGNOSIS — J189 Pneumonia, unspecified organism: Secondary | ICD-10-CM | POA: Diagnosis not present

## 2022-08-14 DIAGNOSIS — A419 Sepsis, unspecified organism: Secondary | ICD-10-CM | POA: Diagnosis not present

## 2022-08-14 LAB — LACTIC ACID, PLASMA
Lactic Acid, Venous: 4.9 mmol/L (ref 0.5–1.9)
Lactic Acid, Venous: 0.9 mmol/L (ref 0.5–1.9)
Lactic Acid, Venous: 1.4 mmol/L (ref 0.5–1.9)

## 2022-08-14 LAB — URINALYSIS, COMPLETE (UACMP) WITH MICROSCOPIC
Bilirubin Urine: NEGATIVE
Glucose, UA: NEGATIVE mg/dL
Hgb urine dipstick: NEGATIVE
Ketones, ur: NEGATIVE mg/dL
Leukocytes,Ua: NEGATIVE
Nitrite: NEGATIVE
Protein, ur: NEGATIVE mg/dL
Specific Gravity, Urine: 1.026 (ref 1.005–1.030)
pH: 7 (ref 5.0–8.0)

## 2022-08-14 LAB — CBC WITH DIFFERENTIAL/PLATELET
Abs Immature Granulocytes: 0.24 10*3/uL — ABNORMAL HIGH (ref 0.00–0.07)
Basophils Absolute: 0.1 10*3/uL (ref 0.0–0.1)
Basophils Relative: 0 %
Eosinophils Absolute: 0.1 10*3/uL (ref 0.0–0.5)
Eosinophils Relative: 0 %
HCT: 21.6 % — ABNORMAL LOW (ref 36.0–46.0)
Hemoglobin: 7.5 g/dL — ABNORMAL LOW (ref 12.0–15.0)
Immature Granulocytes: 1 %
Lymphocytes Relative: 5 %
Lymphs Abs: 1.2 10*3/uL (ref 0.7–4.0)
MCH: 32.2 pg (ref 26.0–34.0)
MCHC: 34.7 g/dL (ref 30.0–36.0)
MCV: 92.7 fL (ref 80.0–100.0)
Monocytes Absolute: 1.8 10*3/uL — ABNORMAL HIGH (ref 0.1–1.0)
Monocytes Relative: 8 %
Neutro Abs: 19.8 10*3/uL — ABNORMAL HIGH (ref 1.7–7.7)
Neutrophils Relative %: 86 %
Platelets: 300 10*3/uL (ref 150–400)
RBC: 2.33 MIL/uL — ABNORMAL LOW (ref 3.87–5.11)
RDW: 18.3 % — ABNORMAL HIGH (ref 11.5–15.5)
WBC: 23.2 10*3/uL — ABNORMAL HIGH (ref 4.0–10.5)
nRBC: 0 % (ref 0.0–0.2)

## 2022-08-14 LAB — SODIUM, URINE, RANDOM: Sodium, Ur: 136 mmol/L

## 2022-08-14 LAB — PHOSPHORUS: Phosphorus: 2.9 mg/dL (ref 2.5–4.6)

## 2022-08-14 LAB — PREALBUMIN: Prealbumin: <5 mg/dL — ABNORMAL LOW (ref 18–38)

## 2022-08-14 LAB — RETICULOCYTES
Immature Retic Fract: 29.5 % — ABNORMAL HIGH (ref 2.3–15.9)
RBC.: 1.99 MIL/uL — ABNORMAL LOW (ref 3.87–5.11)
Retic Count, Absolute: 48.4 10*3/uL (ref 19.0–186.0)
Retic Ct Pct: 2.4 % (ref 0.4–3.1)

## 2022-08-14 LAB — COMPREHENSIVE METABOLIC PANEL
ALT: 14 U/L (ref 0–44)
AST: 19 U/L (ref 15–41)
Albumin: 2.2 g/dL — ABNORMAL LOW (ref 3.5–5.0)
Alkaline Phosphatase: 99 U/L (ref 38–126)
Anion gap: 9 (ref 5–15)
BUN: 14 mg/dL (ref 8–23)
CO2: 20 mmol/L — ABNORMAL LOW (ref 22–32)
Calcium: 7.8 mg/dL — ABNORMAL LOW (ref 8.9–10.3)
Chloride: 113 mmol/L — ABNORMAL HIGH (ref 98–111)
Creatinine, Ser: 1.07 mg/dL — ABNORMAL HIGH (ref 0.44–1.00)
GFR, Estimated: 55 mL/min — ABNORMAL LOW (ref >60)
Glucose, Bld: 107 mg/dL — ABNORMAL HIGH (ref 70–99)
Potassium: 3.2 mmol/L — ABNORMAL LOW (ref 3.5–5.1)
Sodium: 142 mmol/L (ref 135–145)
Total Bilirubin: 0.8 mg/dL (ref 0.3–1.2)
Total Protein: 5.5 g/dL — ABNORMAL LOW (ref 6.5–8.1)

## 2022-08-14 LAB — CREATININE, URINE, RANDOM: Creatinine, Urine: 78 mg/dL

## 2022-08-14 LAB — C-REACTIVE PROTEIN: CRP: 14.5 mg/dL — ABNORMAL HIGH (ref <1.0)

## 2022-08-14 LAB — FERRITIN: Ferritin: 345 ng/mL — ABNORMAL HIGH (ref 11–307)

## 2022-08-14 LAB — PROCALCITONIN: Procalcitonin: 3.36 ng/mL

## 2022-08-14 LAB — D-DIMER, QUANTITATIVE: D-Dimer, Quant: 1.87 ug/mL-FEU — ABNORMAL HIGH (ref 0.00–0.50)

## 2022-08-14 LAB — OSMOLALITY: Osmolality: 308 mOsm/kg — ABNORMAL HIGH (ref 275–295)

## 2022-08-14 LAB — MAGNESIUM: Magnesium: 1.9 mg/dL (ref 1.7–2.4)

## 2022-08-14 LAB — STREP PNEUMONIAE URINARY ANTIGEN: Strep Pneumo Urinary Antigen: NEGATIVE

## 2022-08-14 LAB — MRSA NEXT GEN BY PCR, NASAL: MRSA by PCR Next Gen: DETECTED — AB

## 2022-08-14 LAB — OSMOLALITY, URINE: Osmolality, Ur: 560 mOsm/kg (ref 300–900)

## 2022-08-14 LAB — GLUCOSE, CAPILLARY: Glucose-Capillary: 94 mg/dL (ref 70–99)

## 2022-08-14 MED ORDER — POTASSIUM CHLORIDE CRYS ER 20 MEQ PO TBCR
40.0000 meq | EXTENDED_RELEASE_TABLET | ORAL | Status: AC
  Administered 2022-08-14 (×2): 40 meq via ORAL
  Filled 2022-08-14 (×2): qty 2

## 2022-08-14 MED ORDER — MOMETASONE FURO-FORMOTEROL FUM 200-5 MCG/ACT IN AERO
2.0000 | INHALATION_SPRAY | Freq: Two times a day (BID) | RESPIRATORY_TRACT | Status: DC
  Administered 2022-08-14 – 2022-08-17 (×7): 2 via RESPIRATORY_TRACT
  Filled 2022-08-14: qty 8.8

## 2022-08-14 MED ORDER — CHLORHEXIDINE GLUCONATE CLOTH 2 % EX PADS
6.0000 | MEDICATED_PAD | Freq: Every day | CUTANEOUS | Status: DC
  Administered 2022-08-14 – 2022-08-17 (×4): 6 via TOPICAL

## 2022-08-14 MED ORDER — AMLODIPINE BESYLATE 5 MG PO TABS
5.0000 mg | ORAL_TABLET | Freq: Every day | ORAL | Status: DC
  Administered 2022-08-14 – 2022-08-17 (×4): 5 mg via ORAL
  Filled 2022-08-14 (×4): qty 1

## 2022-08-14 MED ORDER — ACETAMINOPHEN 650 MG RE SUPP: 650.0000 mg | Freq: Four times a day (QID) | RECTAL | Status: DC | PRN

## 2022-08-14 MED ORDER — GERHARDT'S BUTT CREAM
1.0000 | TOPICAL_CREAM | Freq: Three times a day (TID) | CUTANEOUS | Status: DC
  Administered 2022-08-14 – 2022-08-18 (×11): 1 via TOPICAL
  Filled 2022-08-14: qty 1

## 2022-08-14 MED ORDER — TRAMADOL HCL 50 MG PO TABS
50.0000 mg | ORAL_TABLET | Freq: Three times a day (TID) | ORAL | Status: DC | PRN
  Administered 2022-08-14 – 2022-08-17 (×3): 50 mg via ORAL
  Filled 2022-08-14 (×3): qty 1

## 2022-08-14 MED ORDER — FESOTERODINE FUMARATE ER 4 MG PO TB24
4.0000 mg | ORAL_TABLET | Freq: Every day | ORAL | Status: DC
  Administered 2022-08-14 – 2022-08-17 (×4): 4 mg via ORAL
  Filled 2022-08-14 (×5): qty 1

## 2022-08-14 MED ORDER — ACETAMINOPHEN 325 MG PO TABS: 650.0000 mg | ORAL_TABLET | Freq: Four times a day (QID) | ORAL | Status: DC | PRN

## 2022-08-14 MED ORDER — SODIUM CHLORIDE 0.9 % IV SOLN
1.0000 g | INTRAVENOUS | Status: DC
  Administered 2022-08-14 – 2022-08-16 (×3): 1 g via INTRAVENOUS
  Filled 2022-08-14 (×3): qty 10

## 2022-08-14 MED ORDER — LATANOPROST 0.005 % OP SOLN
1.0000 [drp] | Freq: Every day | OPHTHALMIC | Status: DC
  Administered 2022-08-14 – 2022-08-17 (×4): 1 [drp] via OPHTHALMIC
  Filled 2022-08-14: qty 2.5

## 2022-08-14 MED ORDER — HYDROCODONE-ACETAMINOPHEN 5-325 MG PO TABS: 1.0000 | ORAL_TABLET | ORAL | Status: DC | PRN

## 2022-08-14 MED ORDER — CARVEDILOL 12.5 MG PO TABS
12.5000 mg | ORAL_TABLET | Freq: Two times a day (BID) | ORAL | Status: DC
  Administered 2022-08-14 – 2022-08-17 (×7): 12.5 mg via ORAL
  Filled 2022-08-14 (×7): qty 1

## 2022-08-14 MED ORDER — SODIUM CHLORIDE 0.9 % IV SOLN: INTRAVENOUS | Status: DC

## 2022-08-14 MED ORDER — SODIUM CHLORIDE 0.45 % IV SOLN: INTRAVENOUS | Status: AC

## 2022-08-14 NOTE — ED Notes (Signed)
Pt appears to be to be anxious and fidgeting with things, removing cords and clothing. Pt denies feeling anxious or being in pain.

## 2022-08-14 NOTE — Consult Note (Signed)
   Palliative Care Consult Note                                  Date: 08/14/2022   Patient Name: Stephanie Benton  DOB: 1950-08-11  MRN: [id number]  Age / Sex: 73 y.o., female  PCP: Stephanie Pardon, MD Referring Physician: Charlynne Cousins, MD  Reason for Consultation: {Reason for Consult:23484}  HPI/Patient Profile: 72 y.o. female  with past medical history of *** admitted on 08/13/2022 with ***.   Past Medical History:  Diagnosis Date   Hyperlipidemia    Hypertension    Prolonged QT interval 06/21/2022    Subjective:   This NP Stephanie Benton reviewed medical records, received report from team, assessed the patient and then meet at the patient's bedside to discuss diagnosis, prognosis, GOC, EOL wishes disposition and options.  I met with ***.   Concept of Palliative Care was introduced as specialized medical care for people and their families living with serious illness.  If focuses on providing relief from the symptoms and stress of a serious illness.  The goal is to improve quality of life for both the patient and the family. Values and goals of care important to patient and family were attempted to be elicited.  Created space and opportunity for patient  and family to explore thoughts and feelings regarding current medical situation   Natural trajectory and current clinical status were discussed. Questions and concerns addressed. Patient  encouraged to call with questions or concerns.    Patient/Family Understanding of Illness: ***  Life Review: ***  Patient Values: ***  Goals: ***  Today's Discussion: ***  Review of Systems  Objective:   Primary Diagnoses: Present on Admission:  CAP (community acquired pneumonia)  Hypertension  AKI (acute kidney injury) ([place])  Anemia  Bipolar disorder, in full remission, most recent episode mixed ([place])  Acute metabolic encephalopathy  Severe sepsis (HCC)  Tobacco  abuse   Physical Exam  Vital Signs:  BP (!) 140/109   Pulse 90   Temp (!) 97.5 F (36.4 C) (Oral)   Resp 19   Wt 86.2 kg   SpO2 98%   BMI 31.62 kg/m   Palliative Assessment/Data: ***    Advanced Care Planning:   Existing Vynca/ACP Documentation: ***  Primary Decision Maker: {Primary Decision IHKVQ:25956}  Code Status/Advance Care Planning: {Palliative Code status:23503}  A discussion was had today regarding advanced directives. Concepts specific to code status, artifical feeding and hydration, continued IV antibiotics and rehospitalization was had.  The difference between a aggressive medical intervention path and a palliative comfort care path for this patient at this time was had. ***The MOST form was introduced and discussed.***  Decisions/Changes to ACP: ***  Assessment & Plan:   Impression: ***  SUMMARY OF RECOMMENDATIONS   ***  Symptom Management:  ***  Prognosis:  {Palliative Care Prognosis:23504}  Discharge Planning:  {Palliative dispostion:23505}   Discussed with: ***    Thank you for allowing [name] to participate in the care of Stephanie Benton PMT will continue to support holistically.  Time Total: ***  Greater than 50%  of this time was spent counseling and coordinating care related to the above assessment and plan.  Signed by: Stephanie Field, NP Palliative Medicine Team  Team Phone # [phone] (Nights/Weekends)  08/14/2022, 9:27 AM

## 2022-08-14 NOTE — Plan of Care (Signed)
  Problem: Education: Goal: Knowledge of risk factors and measures for prevention of condition will improve Outcome: Progressing   Problem: Coping: Goal: Psychosocial and spiritual needs will be supported Outcome: Progressing   Problem: Respiratory: Goal: Will maintain a patent airway Outcome: Progressing Goal: Complications related to the disease process, condition or treatment will be avoided or minimized Outcome: Progressing   Problem: Fluid Volume: Goal: Hemodynamic stability will improve Outcome: Progressing   Problem: Clinical Measurements: Goal: Diagnostic test results will improve Outcome: Progressing Goal: Signs and symptoms of infection will decrease Outcome: Progressing   Problem: Respiratory: Goal: Ability to maintain adequate ventilation will improve Outcome: Progressing   Problem: Education: Goal: Knowledge of General Education information will improve Description: Including pain rating scale, medication(s)/side effects and non-pharmacologic comfort measures Outcome: Progressing   Problem: Health Behavior/Discharge Planning: Goal: Ability to manage health-related needs will improve Outcome: Progressing   Problem: Clinical Measurements: Goal: Ability to maintain clinical measurements within normal limits will improve Outcome: Progressing Goal: Will remain free from infection Outcome: Progressing Goal: Diagnostic test results will improve Outcome: Progressing Goal: Respiratory complications will improve Outcome: Progressing Goal: Cardiovascular complication will be avoided Outcome: Progressing   Problem: Activity: Goal: Risk for activity intolerance will decrease Outcome: Progressing   Problem: Nutrition: Goal: Adequate nutrition will be maintained Outcome: Progressing   Problem: Coping: Goal: Level of anxiety will decrease Outcome: Progressing   Problem: Elimination: Goal: Will not experience complications related to bowel motility Outcome:  Progressing Goal: Will not experience complications related to urinary retention Outcome: Progressing   Problem: Pain Managment: Goal: General experience of comfort will improve Outcome: Progressing   Problem: Safety: Goal: Ability to remain free from injury will improve Outcome: Progressing   Problem: Skin Integrity: Goal: Risk for impaired skin integrity will decrease Outcome: Progressing

## 2022-08-14 NOTE — ED Notes (Signed)
Pt transported to MRI 

## 2022-08-14 NOTE — Progress Notes (Addendum)
[facility] PROGRESS NOTE    Progress Note  Stephanie Benton  MLY:[id number] DOB: 18-Nov-1950 DOA: 08/13/2022 PCP: [name], MD     Brief Narrative:   Stephanie Benton is an 72 y.o. female past medical history of hypertension, chronic abdominal pain pancreatitis and esophagitis, recently discharged from the hospital from sepsis last month due to community-acquired pneumonia.  Was febrile hypotensive as well as hypoxic requiring a nonrebreather   Assessment/Plan:   Severe sepsis with acute organ dysfunction due to CAP (community acquired pneumonia) Hypoxic on admission with a right lower lobe infiltrates. Started on aztreonam and azithromycin discontinued aztreonam start IV Rocephin. Blood cultures and sputum cultures have been sent. Concern about postviral pneumonia Strep pneumo was negative.  She has remained afebrile leukocytosis improving.  Acute respiratory failure with hypoxia: Uses no oxygen at home currently requiring 3 L to keep saturations greater 93%. Due to community-acquired pneumonia.  COVID-19 infection: Recently diagnosed she does not know when to visit.  Acute metabolic encephalopathy: Likely due to infectious etiology. Started on IV fluids and antibiotics. Acute kidney injury: Likely due to sepsis, with a baseline creatinine of less than than 1 around 0.7, slowly improving with IV fluid resuscitation.  Normocytic anemia: Hemoglobin is around 8.5-7.5. Baseline is around 8. Continue to monitor.  Bipolar disorder: In full remission continue Current home medication.  Essential hypertension: Restart Coreg hold ARB and torsemide. Started on low-dose Coreg continue to monitor blood pressure.  Tobacco abuse: Noted.  Hypokalemia: Replete orally  DVT prophylaxis: SCD Family Communication:none Status is: Inpatient Remains inpatient appropriate because: Acute respiratory failure/severe sepsis due to pneumonia.    Code Status:     Code  Status Orders  (From admission, onward)           Start     Ordered   08/13/22 1932  Do not attempt resuscitation (DNR)  Continuous       Question Answer Comment  If patient has no pulse and is not breathing Do Not Attempt Resuscitation   If patient has a pulse and/or is breathing: Medical Treatment Goals LIMITED ADDITIONAL INTERVENTIONS: Use medication/IV fluids and cardiac monitoring as indicated; Do not use intubation or mechanical ventilation (DNI), also provide comfort medications.  Transfer to Progressive/Stepdown as indicated, avoid Intensive Care.   Consent: Discussion documented in EHR or advanced directives reviewed      08/13/22 1932           Code Status History     Date Active Date Inactive Code Status Order ID Comments User Context   07/20/2022 1729 07/22/2022 1925 DNR [id number]  Terrilee Croak, MD Inpatient   07/16/2022 0445 07/20/2022 1729 Full Code [id number]  Shela Leff, MD ED   07/08/2022 0840 07/13/2022 1806 Full Code [id number]  Reubin Milan, MD ED   06/21/2022 1753 06/25/2022 1934 Full Code [id number]  Reubin Milan, MD Inpatient   06/21/2022 1555 06/21/2022 1753 Full Code [id number]  Reubin Milan, MD ED   05/17/2022 0147 05/23/2022 1715 Full Code [id number]  Toy Baker, MD ED   02/17/2018 0446 02/18/2018 1413 Full Code [id number]  Vianne Bulls, MD Inpatient         IV Access:   Peripheral IV   Procedures and diagnostic studies:   MR BRAIN WO CONTRAST  Result Date: 08/14/2022 CLINICAL DATA:  Initial evaluation for delirium. EXAM: MRI HEAD WITHOUT CONTRAST TECHNIQUE: Multiplanar, multiecho pulse sequences of the brain and surrounding structures were obtained without intravenous contrast. COMPARISON:  CT from  08/13/2022. FINDINGS: Brain: Examination moderately degraded by motion artifact. Generalized age-related cerebral atrophy. Patchy and confluent T2/FLAIR hyperintensity involving the supratentorial cerebral white  matter, most characteristic of chronic microvascular ischemic disease, mild to moderate in nature. No evidence for acute or subacute ischemia. Gray-white matter differentiation maintained. No areas of chronic cortical infarction. No visible acute or chronic intracranial blood products. No mass lesion, midline shift or mass effect. No hydrocephalus or extra-axial fluid collection. Vascular: Major intracranial vascular flow voids are maintained. Skull and upper cervical spine: Craniocervical junction grossly within normal limits. Bone marrow signal intensity grossly normal. No scalp soft tissue abnormality. Sinuses/Orbits: Prior ocular lens replacement on the right. Globes orbital soft tissues demonstrate no acute finding. Mild mucosal thickening about the ethmoidal air cells. No mastoid effusion. Other: None. IMPRESSION: 1. No acute intracranial abnormality. 2. Age-related cerebral atrophy with mild to moderate chronic microvascular ischemic disease. Electronically Signed   By: Jeannine Boga M.D.   On: 08/14/2022 02:42   CT HEAD WO CONTRAST (5MM)  Result Date: 08/13/2022 CLINICAL DATA:  Mental status change, unknown cause EXAM: CT HEAD WITHOUT CONTRAST TECHNIQUE: Contiguous axial images were obtained from the base of the skull through the vertex without intravenous contrast. RADIATION DOSE REDUCTION: This exam was performed according to the departmental dose-optimization program which includes automated exposure control, adjustment of the mA and/or kV according to patient size and/or use of iterative reconstruction technique. COMPARISON:  CT head 07/15/2022. FINDINGS: Brain: No evidence of acute infarction, hemorrhage, hydrocephalus, extra-axial collection or mass lesion/mass effect. Vascular: No hyperdense vessel. Skull: No acute fracture. Sinuses/Orbits: Clear sinuses.  No acute orbital findings. Other: No mastoid effusions. IMPRESSION: No evidence of acute intracranial abnormality. Electronically Signed    By: Margaretha Sheffield M.D.   On: 08/13/2022 17:49   CT CHEST ABDOMEN PELVIS W CONTRAST  Result Date: 08/13/2022 CLINICAL DATA:  Sepsis fever, lactic acidosis EXAM: CT CHEST, ABDOMEN, AND PELVIS WITH CONTRAST TECHNIQUE: Multidetector CT imaging of the chest, abdomen and pelvis was performed following the standard protocol during bolus administration of intravenous contrast. RADIATION DOSE REDUCTION: This exam was performed according to the departmental dose-optimization program which includes automated exposure control, adjustment of the mA and/or kV according to patient size and/or use of iterative reconstruction technique. CONTRAST:  50mL OMNIPAQUE IOHEXOL 350 MG/ML SOLN COMPARISON:  None Available. FINDINGS: CT CHEST FINDINGS Cardiovascular: Calcific atherosclerosis of the aorta and coronary arteries. Normal heart size. No pericardial effusion. Mediastinum/Nodes: Moderate size hiatal hernia. Lungs/Pleura: Left lower lobe consolidation. No pleural effusions or pneumothorax. Musculoskeletal: Healing subacute left posterior eleventh rib fracture. CT ABDOMEN PELVIS FINDINGS Hepatobiliary: No focal liver abnormality is seen. Status post cholecystectomy. No biliary dilatation. Pancreas: Unremarkable. No pancreatic ductal dilatation or surrounding inflammatory changes. Spleen: Normal in size without focal abnormality. Adrenals/Urinary Tract: Adrenal glands are unremarkable. Kidneys are normal, without renal calculi, focal lesion, or hydronephrosis. Bladder is unremarkable. Small amount of ill-defined fluid adjacent to the right kidney, which appears similar. Stomach/Bowel: No evidence of bowel obstruction. Stool within the colon. No evidence of bowel wall thickening or inflammatory change. Vascular/Lymphatic: Aortic atherosclerosis. No enlarged abdominal or pelvic lymph nodes. Reproductive: Status post hysterectomy. No adnexal masses. Other: Stable chronic ovoid fluid collection along the anterior abdominal wall  above the umbilicus. This is associated with a ventral mesh hernia repair. Similar adjacent calcified structure. Musculoskeletal: Degenerative changes, greatest in the lower lumbar spine. IMPRESSION: 1. Left lower lobe consolidation, suspicious for pneumonia and/or aspiration. 2. Healing subacute left posterior eleventh rib fracture.  3. Stable chronic fluid collection along the anterior mesh from prior ventral hernia repair. Moderately sized hiatal hernia. 4.  Aortic Atherosclerosis (ICD10-I70.0). Electronically Signed   By: Margaretha Sheffield M.D.   On: 08/13/2022 17:38   DG Chest Portable 1 View  Result Date: 08/13/2022 CLINICAL DATA:  Central line placement EXAM: PORTABLE CHEST 1 VIEW COMPARISON:  08/13/2022 at 2:01 p.m. FINDINGS: Right internal jugular central venous catheter present with tip at the brachiocephalic confluence with the SVC. No pneumothorax. Atherosclerotic calcification of the aortic arch. Mild enlargement of the cardiopericardial silhouette noted with retrocardiac opacity compatible with moderate-sized hiatal hernia. IMPRESSION: 1. Right internal jugular central venous catheter tip is at the brachiocephalic confluence with the SVC. No pneumothorax. 2. Mild enlargement of the cardiopericardial silhouette. 3. Moderate-sized hiatal hernia. Electronically Signed   By: Van Clines M.D.   On: 08/13/2022 16:10   DG Chest Port 1 View  Result Date: 08/13/2022 CLINICAL DATA:  Altered mental status.  Recent COVID pneumonia EXAM: PORTABLE CHEST 1 VIEW COMPARISON:  Chest radiograph 05/16/2022 FINDINGS: Stable cardiac and mediastinal contours. Rounded retrocardiac density. No pleural effusion or pneumothorax. Thoracic spine degenerative changes. IMPRESSION: There is a rounded retrocardiac density which is nonspecific and may represent the patient's hiatal hernia. Possibility of underlying pulmonary consolidation in the setting of infection not entirely excluded. Electronically Signed   By: Lovey Newcomer M.D.   On: 08/13/2022 14:20     Medical Consultants:   None.   Subjective:    Lonie Peak relates she feels better, she cannot tell me when she was diagnosed with COVID-19  Objective:    Vitals:   08/14/22 0500 08/14/22 0502 08/14/22 0600 08/14/22 0658  BP: (!) 154/86 (!) 154/86 (!) 152/70 (!) 168/84  Pulse: 91 90 92 94  Resp:  (!) 22    Temp:  98.7 F (37.1 C)    TempSrc:  Oral    SpO2: 99% 99% 96% 98%  Weight:       SpO2: 98 % O2 Flow Rate (L/min): 3 L/min   Intake/Output Summary (Last 24 hours) at 08/14/2022 0716 Last data filed at 08/14/2022 0447 Gross per 24 hour  Intake 326.94 ml  Output --  Net 326.94 ml   Filed Weights   08/13/22 2201  Weight: 86.2 kg    Exam: General exam: In no acute distress. Respiratory system: Good air movement and clear to auscultation. Cardiovascular system: S1 & S2 heard, RRR. No JVD. Gastrointestinal system: Abdomen is nondistended, soft and nontender.  Extremities: No pedal edema. Skin: No rashes, lesions or ulcers Psychiatry: Judgement and insight appear normal. Mood & affect appropriate.    Data Reviewed:    Labs: Basic Metabolic Panel: Recent Labs  Lab 08/13/22 1417 08/13/22 1440 08/13/22 1814 08/13/22 2004 08/14/22 0454  NA 144 144 144  --  142  K 7.9* 3.8 3.7  --  3.2*  CL  --  116* 115*  --  113*  CO2  --  19* 20*  --  20*  GLUCOSE  --  85 94  --  107*  BUN  --  10 11  --  14  CREATININE  --  1.18* 1.19*  --  1.07*  CALCIUM  --  7.9* 7.6*  --  7.8*  MG  --   --   --  1.9 1.9  PHOS  --   --   --  3.6 2.9   GFR Estimated Creatinine Clearance: 51.5 mL/min (A) (by C-G formula  based on SCr of 1.07 mg/dL (H)). Liver Function Tests: Recent Labs  Lab 08/13/22 1440 08/13/22 1814 08/14/22 0454  AST 19 18 19   ALT 14 13 14   ALKPHOS 98 93 99  BILITOT 0.9 1.2 0.8  PROT 4.9* 4.6* 5.5*  ALBUMIN 1.7* 1.6* 2.2*   Recent Labs  Lab 08/13/22 2002-11-27  LIPASE 46   No results for input(s): "AMMONIA"  in the last 168 hours. Coagulation profile Recent Labs  Lab 08/13/22 1440  INR 1.4*   COVID-19 Labs  Recent Labs    08/13/22 27-Nov-2002 08/14/22 0454  DDIMER  --  1.87*  FERRITIN 315* 345*  LDH 216*  --   CRP 10.9* 14.5*    Lab Results  Component Value Date   SARSCOV2NAA POSITIVE (A) 08/13/2022   SARSCOV2NAA NEGATIVE 07/15/2022   SARSCOV2NAA NEGATIVE 05/17/2022   [place] NEGATIVE 10/11/2019    CBC: Recent Labs  Lab 08/13/22 1417 08/13/22 1440 08/14/22 0454  WBC  --  24.6* 23.2*  NEUTROABS  --  21.6* 19.8*  HGB 10.2* 8.5* 7.5*  HCT 30.0* 24.7* 21.6*  MCV  --  92.9 92.7  PLT  --  334 300   Cardiac Enzymes: Recent Labs  Lab 08/13/22 1814  CKTOTAL 29*   BNP (last 3 results) No results for input(s): "PROBNP" in the last 8760 hours. CBG: No results for input(s): "GLUCAP" in the last 168 hours. D-Dimer: Recent Labs    08/14/22 0454  DDIMER 1.87*   Hgb A1c: No results for input(s): "HGBA1C" in the last 72 hours. Lipid Profile: No results for input(s): "CHOL", "HDL", "LDLCALC", "TRIG", "CHOLHDL", "LDLDIRECT" in the last 72 hours. Thyroid function studies: Recent Labs    08/13/22 27-Nov-2002  TSH 1.109   Anemia work up: Recent Labs    08/13/22 11-27-02 08/14/22 0454  VITAMINB12 313  --   FOLATE 14.5  --   FERRITIN 315* 345*  TIBC NOT CALCULATED  --   IRON 11*  --   RETICCTPCT 2.4  --    Sepsis Labs: Recent Labs  Lab 08/13/22 1440 08/13/22 1814 08/13/22 27-Nov-2002 08/14/22 0120 08/14/22 0453 08/14/22 0454  PROCALCITON  --  2.50 2.85  --   --  3.36  WBC 24.6*  --   --   --   --  23.2*  LATICACIDVEN 4.9*  --  1.0 0.9 1.4  --    Microbiology Recent Results (from the past 240 hour(s))  Resp panel by RT-PCR (RSV, Flu A&B, Covid) Anterior Nasal Swab     Status: Abnormal   Collection Time: 08/13/22  1:44 PM   Specimen: Anterior Nasal Swab  Result Value Ref Range Status   SARS Coronavirus 2 by RT PCR POSITIVE (A) NEGATIVE Final    Comment:  (NOTE) SARS-CoV-2 target nucleic acids are DETECTED.  The SARS-CoV-2 RNA is generally detectable in upper respiratory specimens during the acute phase of infection. Positive results are indicative of the presence of the identified virus, but do not rule out bacterial infection or co-infection with other pathogens not detected by the test. Clinical correlation with patient history and other diagnostic information is necessary to determine patient infection status. The expected result is Negative.  Fact Sheet for Patients: EntrepreneurPulse.com.au  Fact Sheet for Healthcare Providers: IncredibleEmployment.be  This test is not yet approved or cleared by the [place] FDA and  has been authorized for detection and/or diagnosis of SARS-CoV-2 by FDA under an Emergency Use Authorization (EUA).  This EUA will remain in effect (meaning this  test can be used) for the duration of  the COVID-19 declaration under Section 564(b)(1) of the A ct, 21 U.S.C. section 360bbb-3(b)(1), unless the authorization is terminated or revoked sooner.     Influenza A by PCR NEGATIVE NEGATIVE Final   Influenza B by PCR NEGATIVE NEGATIVE Final    Comment: (NOTE) The Xpert Xpress SARS-CoV-2/FLU/RSV plus assay is intended as an aid in the diagnosis of influenza from Nasopharyngeal swab specimens and should not be used as a sole basis for treatment. Nasal washings and aspirates are unacceptable for Xpert Xpress SARS-CoV-2/FLU/RSV testing.  Fact Sheet for Patients: BloggerCourse.com  Fact Sheet for Healthcare Providers: SeriousBroker.it  This test is not yet approved or cleared by the [place] FDA and has been authorized for detection and/or diagnosis of SARS-CoV-2 by FDA under an Emergency Use Authorization (EUA). This EUA will remain in effect (meaning this test can be used) for the duration of the COVID-19  declaration under Section 564(b)(1) of the Act, 21 U.S.C. section 360bbb-3(b)(1), unless the authorization is terminated or revoked.     Resp Syncytial Virus by PCR NEGATIVE NEGATIVE Final    Comment: (NOTE) Fact Sheet for Patients: BloggerCourse.com  Fact Sheet for Healthcare Providers: SeriousBroker.it  This test is not yet approved or cleared by the [place] FDA and has been authorized for detection and/or diagnosis of SARS-CoV-2 by FDA under an Emergency Use Authorization (EUA). This EUA will remain in effect (meaning this test can be used) for the duration of the COVID-19 declaration under Section 564(b)(1) of the Act, 21 U.S.C. section 360bbb-3(b)(1), unless the authorization is terminated or revoked.  Performed at [facility], [address] [place], [place] [address]      Medications:    latanoprost  1 drop Both Eyes QHS   nicotine  21 mg Transdermal Daily   Continuous Infusions:  sodium chloride 100 mL/hr at 08/14/22 0447   azithromycin Stopped (08/13/22 2124)   aztreonam Stopped (08/14/22 4098)   lactated ringers Stopped (08/14/22 0503)   vancomycin        LOS: 1 day   Marinda Elk  [facility]  08/14/2022, 7:16 AM

## 2022-08-14 NOTE — Evaluation (Signed)
Occupational Therapy Evaluation Patient Details Name: Stephanie Benton MRN: [id number] DOB: [birth year] Today's Date: 08/14/2022   History of Present Illness Pt is a 72 y/o female admitted for CAP and AMS in setting of recent COVID-19. PMH: HTN, HLD, chronic abdominal pain w/ pancreatitis and esophagitis.   Clinical Impression   PTA, pt lives alone, typically Modified Independent with ADLs/mobility per chart though pt unable to confirm due to AMS today. Pt presenting with impaired cognition impacting participation w/ inconsistent command following, poor attention to tasks and max cues to initiate/sequence. Ultimately, unable to sit EOB d/t cognition with pt requiring Max-Total A for ADLs bed level. At this time, rec SNF rehab at [place] as pt unable to safely care for self at this level. Anticipate good progress as cognition clears and will continue to follow acutely.  SpO2 97% on 3 L O2      Recommendations for follow up therapy are one component of a multi-disciplinary discharge planning process, led by the attending physician.  Recommendations may be updated based on patient status, additional functional criteria and insurance authorization.   Follow Up Recommendations  Skilled nursing-short term rehab (<3 hours/day)     Assistance Recommended at Discharge Frequent or constant Supervision/Assistance  Patient can return home with the following Two people to help with walking and/or transfers;A lot of help with bathing/dressing/bathroom;Assistance with feeding    Functional Status Assessment  Patient has had a recent decline in their functional status and demonstrates the ability to make significant improvements in function in a reasonable and predictable amount of time.  Equipment Recommendations  Other (comment) (TBD pending progress)    Recommendations for Other Services       Precautions / Restrictions Precautions Precautions: Fall;Other (comment) Precaution Comments: monitor BP,  O2 Restrictions Weight Bearing Restrictions: No      Mobility Bed Mobility Overal bed mobility: Needs Assistance             General bed mobility comments: attempted to sit EOB as pt agreeable with RLE kicked off of stretcher, attempted to bring LLE to EOB as well though pt unable to attend to task to participate effectively    Transfers                   General transfer comment: unable      Balance                                           ADL either performed or assessed with clinical judgement   ADL Overall ADL's : Needs assistance/impaired Eating/Feeding: Maximal assistance   Grooming: Maximal assistance;Bed level;Wash/dry face Grooming Details (indicate cue type and reason): had dropped washcloth, unaware and began rubbing face with hand. placed washcloth in hand and with cues, pt able to wash R side of face, did not attempt L side of face. Max A to wipe off hands where dried blood was found Upper Body Bathing: Maximal assistance;Bed level   Lower Body Bathing: Total assistance;Bed level   Upper Body Dressing : Maximal assistance;Bed level   Lower Body Dressing: Total assistance;Bed level       Toileting- Clothing Manipulation and Hygiene: Total assistance;Bed level         General ADL Comments: Limited by impaired cognition with disorientation, difficulty attending to cues and/or initiating tasks     Vision Baseline Vision/History: 1 Wears glasses Ability to  See in Adequate Light: 2 Moderately impaired Patient Visual Report: Other (comment) (reports "I cant see" but endorses that she normally wears glasses) Vision Assessment?: Vision impaired- to be further tested in functional context Additional Comments: likely will improve once glasses located and cognition clears; to be further assessed     Perception     Praxis      Pertinent Vitals/Pain Pain Assessment Pain Assessment: Faces Faces Pain Scale: Hurts little  more Pain Location: generalized with movement, pt unable to specify location Pain Descriptors / Indicators: Grimacing, Guarding Pain Intervention(s): Limited activity within patient's tolerance, Monitored during session     Hand Dominance Right   Extremity/Trunk Assessment Upper Extremity Assessment Upper Extremity Assessment: Generalized weakness;Difficult to assess due to impaired cognition   Lower Extremity Assessment Lower Extremity Assessment: Defer to PT evaluation       Communication Communication Communication: Expressive difficulties   Cognition Arousal/Alertness: Awake/alert Behavior During Therapy: Flat affect, Restless Overall Cognitive Status: No family/caregiver present to determine baseline cognitive functioning Area of Impairment: Orientation, Attention, Memory, Following commands, Safety/judgement, Awareness, Problem solving                 Orientation Level: Disoriented to, Place, Time, Situation Current Attention Level: Focused Memory: Decreased short-term memory Following Commands: Follows one step commands inconsistently Safety/Judgement: Decreased awareness of safety, Decreased awareness of deficits Awareness: Intellectual Problem Solving: Slow processing, Decreased initiation, Difficulty sequencing, Requires verbal cues, Requires tactile cues General Comments: pt surprised to hear that she was in the hospital, pointing and reaching for things that were not present in room. inconsistent command following and poor initiation     General Comments  SpO2 97% on 3 L O2, BP WFL (noted low on arrival to ED)    Exercises     Shoulder Instructions      Home Living Family/patient expects to be discharged to:: Private residence Living Arrangements: Alone Available Help at Discharge: Family;Available PRN/intermittently Type of Home: Apartment Home Access: Level entry     Home Layout: One level     Bathroom Shower/Tub: Engineer, site: Standard     Home Equipment: Advice worker (2 wheels);BSC/3in1   Additional Comments: From [employer] apartments per chart, pt unable to provide current info      Prior Functioning/Environment Prior Level of Function : Patient poor historian/Family not available             Mobility Comments: use of hurrycane for mobility inside & RW when outside- info from previous encounter ADLs Comments: unable to provide PLOF for ADL, assume fairly independent if living alone.        OT Problem List: Decreased strength;Impaired balance (sitting and/or standing);Decreased activity tolerance;Decreased cognition;Decreased safety awareness;Decreased coordination;Impaired vision/perception      OT Treatment/Interventions: Self-care/ADL training;Therapeutic exercise;Energy conservation;DME and/or AE instruction;Therapeutic activities;Patient/family education    OT Goals(Current goals can be found in the care plan section) Acute Rehab OT Goals Patient Stated Goal: unable to state OT Goal Formulation: Patient unable to participate in goal setting Time For Goal Achievement: 08/28/22 Potential to Achieve Goals: Fair ADL Goals Pt Will Perform Eating: with supervision;sitting;bed level Pt Will Perform Grooming: with min assist;sitting;bed level Pt Will Transfer to Toilet: with min assist;stand pivot transfer;bedside commode Additional ADL Goal #1: Pt to complete bed mobility with Min A in prep for ADL tasks  OT Frequency: Min 2X/week    Co-evaluation              AM-PAC  OT "6 Clicks" Daily Activity     Outcome Measure Help from another person eating meals?: A Lot Help from another person taking care of personal grooming?: A Lot Help from another person toileting, which includes using toliet, bedpan, or urinal?: Total Help from another person bathing (including washing, rinsing, drying)?: A Lot Help from another person to put on and taking off regular upper body  clothing?: A Lot Help from another person to put on and taking off regular lower body clothing?: Total 6 Click Score: 10   End of Session Equipment Utilized During Treatment: Oxygen Nurse Communication: Mobility status  Activity Tolerance: Other (comment) (limited by cognition) Patient left: in bed;with call bell/phone within reach  OT Visit Diagnosis: Muscle weakness (generalized) (M62.81);Other symptoms and signs involving cognitive function                Time: 7939-0300 OT Time Calculation (min): 21 min Charges:  OT General Charges $OT Visit: 1 Visit OT Evaluation $OT Eval Moderate Complexity: 1 Mod  Bradd Canary, OTR/L Acute Rehab Services Office: [phone]   Lorre Munroe 08/14/2022, 8:33 AM

## 2022-08-14 NOTE — Progress Notes (Signed)
Physical Therapy Evaluation Patient Details Name: Stephanie Benton MRN: [id number] DOB: 07-11-1951 Today's Date: 08/14/2022  History of Present Illness  Pt is a 72 y/o female admitted for CAP and AMS in setting of recent COVID-19. PMH: HTN, HLD, chronic abdominal pain w/ pancreatitis and esophagitis.  Clinical Impression  Pt was seen after admission from SNF to be managed with post covid PNA and ABT, with no clear PLOF noted.  Her current ability to move is with max assist to partially sit on gurney, and with a hosp bed would be much safer to manage sitting balance control.  Has L sided weakness and tendency to hold tone, esp to PF on L ankle and extend leg, but holds LUE in flexed posture with weak active grip.  Will posit that her previous functional level was not ambulatory, but recommend her to have therapy to decrease her burden of care for nursing staff to handle her bed mobility and control of balance.  Focus on general balance and strengthening to BLE's with goal of decreasing bed mob and transfer assistance.      Recommendations for follow up therapy are one component of a multi-disciplinary discharge planning process, led by the attending physician.  Recommendations may be updated based on patient status, additional functional criteria and insurance authorization.  Follow Up Recommendations Skilled nursing-short term rehab (<3 hours/day) Can patient physically be transported by private vehicle: No    Assistance Recommended at Discharge Frequent or constant Supervision/Assistance  Patient can return home with the following  Two people to help with walking and/or transfers;Two people to help with bathing/dressing/bathroom;Assistance with cooking/housework;Direct supervision/assist for medications management;Direct supervision/assist for financial management;Assist for transportation;Help with stairs or ramp for entrance    Equipment Recommendations None recommended by PT  Recommendations  for Other Services       Functional Status Assessment Patient has had a recent decline in their functional status and demonstrates the ability to make significant improvements in function in a reasonable and predictable amount of time.     Precautions / Restrictions Precautions Precautions: Fall;Other (comment) Precaution Comments: monitor BP, O2 Restrictions Weight Bearing Restrictions: No      Mobility  Bed Mobility Overal bed mobility: Needs Assistance Bed Mobility: Supine to Sit, Sit to Supine     Supine to sit: Max assist Sit to supine: Max assist   General bed mobility comments: max assist and resisting movement with possible anxiety about moving but also unsure of what to do even with verbal and tactile cues    Transfers                   General transfer comment: unable to sequence and attempt    Ambulation/Gait               General Gait Details: unable to attempt  Stairs            Wheelchair Mobility    Modified Rankin (Stroke Patients Only)       Balance Overall balance assessment: Needs assistance Sitting-balance support: Bilateral upper extremity supported Sitting balance-Leahy Scale: Zero Sitting balance - Comments: actively resisting sitting on side of bed                                     Pertinent Vitals/Pain Pain Assessment Pain Assessment: Faces Faces Pain Scale: Hurts little more Pain Location: L side of ribcage Pain Descriptors / Indicators:  Guarding Pain Intervention(s): Limited activity within patient's tolerance, Monitored during session, Repositioned    Home Living Family/patient expects to be discharged to:: Private residence Living Arrangements: Alone Available Help at Discharge: Family;Available PRN/intermittently Type of Home: Apartment Home Access: Level entry       Home Layout: One level Home Equipment: Advice worker (2 wheels);BSC/3in1      Prior Function Prior  Level of Function : Patient poor historian/Family not available             Mobility Comments: RW outside hurrycane inside per prev admission ADLs Comments: pt is not verbalizing any answers to questions about home     Hand Dominance   Dominant Hand: Right    Extremity/Trunk Assessment   Upper Extremity Assessment Upper Extremity Assessment: Defer to OT evaluation    Lower Extremity Assessment Lower Extremity Assessment: Difficult to assess due to impaired cognition (resisting moving her legs but can help)    Cervical / Trunk Assessment Cervical / Trunk Assessment: Kyphotic  Communication   Communication: Expressive difficulties  Cognition Arousal/Alertness: Awake/alert Behavior During Therapy: Flat affect, Restless, Agitated Overall Cognitive Status: No family/caregiver present to determine baseline cognitive functioning Area of Impairment: Problem solving, Awareness, Safety/judgement, Following commands, Attention                   Current Attention Level: Selective Memory: Decreased recall of precautions, Decreased short-term memory Following Commands: Follows one step commands inconsistently, Follows one step commands with increased time Safety/Judgement: Decreased awareness of deficits, Decreased awareness of safety Awareness: Intellectual, Anticipatory Problem Solving: Slow processing, Decreased initiation, Difficulty sequencing, Requires verbal cues, Requires tactile cues General Comments: pt was actively resistant to moving to side of bed, initially expressing willingness and then was resistant        General Comments General comments (skin integrity, edema, etc.): sats were stable for mobility as was limited by pt willingness to move, 3L O2 and sat 96%    Exercises     Assessment/Plan    PT Assessment Patient needs continued PT services  PT Problem List Decreased strength;Decreased activity tolerance;Decreased range of motion;Decreased  balance;Decreased mobility;Decreased coordination;Decreased cognition;Decreased safety awareness;Impaired tone       PT Treatment Interventions Functional mobility training;Therapeutic activities;Therapeutic exercise;Balance training;Neuromuscular re-education;Patient/family education    PT Goals (Current goals can be found in the Care Plan section)  Acute Rehab PT Goals Patient Stated Goal: none stated PT Goal Formulation: Patient unable to participate in goal setting Time For Goal Achievement: 08/28/22 Potential to Achieve Goals: Good    Frequency Min 2X/week     Co-evaluation               AM-PAC PT "6 Clicks" Mobility  Outcome Measure Help needed turning from your back to your side while in a flat bed without using bedrails?: A Lot Help needed moving from lying on your back to sitting on the side of a flat bed without using bedrails?: A Lot Help needed moving to and from a bed to a chair (including a wheelchair)?: Total Help needed standing up from a chair using your arms (e.g., wheelchair or bedside chair)?: Total Help needed to walk in hospital room?: Total Help needed climbing 3-5 steps with a railing? : Total 6 Click Score: 8    End of Session Equipment Utilized During Treatment: Oxygen Activity Tolerance: Treatment limited secondary to agitation Patient left: in bed;with call bell/phone within reach Nurse Communication: Mobility status PT Visit Diagnosis: Muscle weakness (generalized) (M62.81);Difficulty in walking,  not elsewhere classified (R26.2);Other abnormalities of gait and mobility (R26.89);Hemiplegia and hemiparesis Hemiplegia - Right/Left: Left Hemiplegia - dominant/non-dominant: Non-dominant Hemiplegia - caused by: Unspecified    Time: 0539-7673 PT Time Calculation (min) (ACUTE ONLY): 16 min   Charges:   PT Evaluation $PT Eval Moderate Complexity: 1 Mod         Ivar Drape 08/14/2022, 3:44 PM  Samul Dada, PT PhD Acute Rehab Dept. Number:  [facility] [id number] and [facility] [phone]

## 2022-08-14 NOTE — ED Notes (Signed)
ED TO INPATIENT HANDOFF REPORT  ED Nurse Name and Phone #: Tori, [phone]  S Name/Age/Gender Stephanie Benton 72 y.o. female Room/Bed: 017C/017C  Code Status   Code Status: DNR  Home/SNF/Other Skilled nursing facility Patient oriented to: self Is this baseline? Yes   Triage Complete: Triage complete  Chief Complaint CAP (community acquired pneumonia) [J18.9]  Triage Note Pt BIB [facility] due to being altered [facility]. Recently had covid with pneumonia was on antibiotics.  102.1 axillary 80/40 pressure  99 non- rebreather    Allergies Allergies  Allergen Reactions   Fish Allergy Anaphylaxis   Morphine And Related Anaphylaxis and Swelling   Belsomra [Suvorexant] Other (See Comments)    Nightmares     Codeine Nausea And Vomiting   Duragesic-100 [Fentanyl] Hives   Motrin [Ibuprofen] Nausea Only and Other (See Comments)    History of stomach ulcers   Nsaids Other (See Comments)    History of stomach ulcers   Penicillins Hives    Did it involve swelling of the face/tongue/throat, SOB, or low BP? Yes Did it involve sudden or severe rash/hives, skin peeling, or any reaction on the inside of your mouth or nose? Yes Did you need to seek medical attention at a hospital or doctor's office? Yes When did it last happen? More than 10 years ago If all above answers are "NO", may proceed with cephalosporin use.    Sulfa Antibiotics Swelling    Not listed on MAR   Tolectin [Tolmetin] Other (See Comments)    History of stomach ulcers Not listed on MAR    Level of Care/Admitting Diagnosis ED Disposition     ED Disposition  Admit   Condition  --   Comment  Hospital Area: [facility] [[id number]]  Level of Care: Progressive [102]  Admit to Progressive based on following criteria: RESPIRATORY PROBLEMS hypoxemic/hypercapnic respiratory failure that is responsive to NIPPV (BiPAP) or High Flow Nasal Cannula (6-80 lpm). Frequent  assessment/intervention, no > Q2 hrs < Q4 hrs, to maintain oxygenation and pulmonary hygiene.  May admit patient to [facility] or [facility] if equivalent level of care is available:: No  Covid Evaluation: Confirmed COVID Positive  Diagnosis: CAP (community acquired pneumonia) [585277]  Admitting Physician: Therisa Doyne [[id number]]  Attending Physician: Therisa Doyne [[id number]]  Certification:: I certify this patient will need inpatient services for at least 2 midnights  Estimated Length of Stay: 2          B Medical/Surgery History Past Medical History:  Diagnosis Date   Hyperlipidemia    Hypertension    Prolonged QT interval 06/21/2022   Past Surgical History:  Procedure Laterality Date   ABDOMINAL HYSTERECTOMY     APPENDECTOMY     BIOPSY  05/17/2022   Procedure: BIOPSY;  Surgeon: Jeani Hawking, MD;  Location: [facility] ENDOSCOPY;  Service: Gastroenterology;;   CHOLECYSTECTOMY N/A 05/22/2022   Procedure: LAPAROSCOPIC CHOLECYSTECTOMY WITH INTRAOPERATIVE CHOLANGIOGRAM;  Surgeon: Abigail Miyamoto, MD;  Location: [facility] OR;  Service: General;  Laterality: N/A;   ESOPHAGOGASTRODUODENOSCOPY (EGD) WITH PROPOFOL N/A 05/17/2022   Procedure: ESOPHAGOGASTRODUODENOSCOPY (EGD) WITH PROPOFOL;  Surgeon: Jeani Hawking, MD;  Location[facility] ENDOSCOPY;  Service: Gastroenterology;  Laterality: N/A;   ESOPHAGOGASTRODUODENOSCOPY (EGD) WITH PROPOFOL N/A 07/12/2022   Procedure: ESOPHAGOGASTRODUODENOSCOPY (EGD) WITH PROPOFOL;  Surgeon: Jeani Hawking, MD;  Location: [facility] ENDOSCOPY;  Service: Gastroenterology;  Laterality: N/A;   HERNIA REPAIR     TONSILLECTOMY     TUBAL LIGATION  A IV Location/Drains/Wounds Patient Lines/Drains/Airways Status     Active Line/Drains/Airways     Name Placement date Placement time Site Days   Peripheral IV 08/13/22 20 G Anterior;Left Wrist 08/13/22  1350  Wrist  1   Peripheral IV 08/13/22 20 G Anterior;Left;Upper Arm 08/13/22  1600  Arm  1   CVC Triple Lumen 08/13/22  External jugular 08/13/22  --  -- 1   Incision (Closed) 05/22/22 Abdomen Other (Comment) 05/22/22  1239  -- 84   Wound / Incision (Open or Dehisced) 07/17/22 Non-pressure wound Knee Anterior;Right abrasion 07/17/22  0522  Knee  28            Intake/Output Last 24 hours  Intake/Output Summary (Last 24 hours) at 08/14/2022 1232 Last data filed at 08/14/2022 0447 Gross per 24 hour  Intake 326.94 ml  Output --  Net 326.94 ml    Labs/Imaging Results for orders placed or performed during the hospital encounter of 08/13/22 (from the past 48 hour(s))  Resp panel by RT-PCR (RSV, Flu A&B, Covid) Anterior Nasal Swab     Status: Abnormal   Collection Time: 08/13/22  1:44 PM   Specimen: Anterior Nasal Swab  Result Value Ref Range   SARS Coronavirus 2 by RT PCR POSITIVE (A) NEGATIVE    Comment: (NOTE) SARS-CoV-2 target nucleic acids are DETECTED.  The SARS-CoV-2 RNA is generally detectable in upper respiratory specimens during the acute phase of infection. Positive results are indicative of the presence of the identified virus, but do not rule out bacterial infection or co-infection with other pathogens not detected by the test. Clinical correlation with patient history and other diagnostic information is necessary to determine patient infection status. The expected result is Negative.  Fact Sheet for Patients: BloggerCourse.comhttps://www.fda.gov/media/152166/download  Fact Sheet for Healthcare Providers: SeriousBroker.ithttps://www.fda.gov/media/152162/download  This test is not yet approved or cleared by the [place] FDA and  has been authorized for detection and/or diagnosis of SARS-CoV-2 by FDA under an Emergency Use Authorization (EUA).  This EUA will remain in effect (meaning this test can be used) for the duration of  the COVID-19 declaration under Section 564(b)(1) of the A ct, 21 U.S.C. section 360bbb-3(b)(1), unless the authorization is terminated or revoked sooner.     Influenza A by PCR  NEGATIVE NEGATIVE   Influenza B by PCR NEGATIVE NEGATIVE    Comment: (NOTE) The Xpert Xpress SARS-CoV-2/FLU/RSV plus assay is intended as an aid in the diagnosis of influenza from Nasopharyngeal swab specimens and should not be used as a sole basis for treatment. Nasal washings and aspirates are unacceptable for Xpert Xpress SARS-CoV-2/FLU/RSV testing.  Fact Sheet for Patients: BloggerCourse.comhttps://www.fda.gov/media/152166/download  Fact Sheet for Healthcare Providers: SeriousBroker.ithttps://www.fda.gov/media/152162/download  This test is not yet approved or cleared by the [place] FDA and has been authorized for detection and/or diagnosis of SARS-CoV-2 by FDA under an Emergency Use Authorization (EUA). This EUA will remain in effect (meaning this test can be used) for the duration of the COVID-19 declaration under Section 564(b)(1) of the Act, 21 U.S.C. section 360bbb-3(b)(1), unless the authorization is terminated or revoked.     Resp Syncytial Virus by PCR NEGATIVE NEGATIVE    Comment: (NOTE) Fact Sheet for Patients: BloggerCourse.comhttps://www.fda.gov/media/152166/download  Fact Sheet for Healthcare Providers: SeriousBroker.ithttps://www.fda.gov/media/152162/download  This test is not yet approved or cleared by the [place] FDA and has been authorized for detection and/or diagnosis of SARS-CoV-2 by FDA under an Emergency Use Authorization (EUA). This EUA will remain in effect (meaning this test can be  used) for the duration of the COVID-19 declaration under Section 564(b)(1) of the Act, 21 U.S.C. section 360bbb-3(b)(1), unless the authorization is terminated or revoked.  Performed at [facility], [address] [place], [place] [address]   I-Stat venous blood gas, [facility], [facility], [facility])     Status: Abnormal   Collection Time: 08/13/22  2:17 PM  Result Value Ref Range   pH, Ven 7.429 7.25 - 7.43   pCO2, Ven 33.0 (L) 44 - 60 mmHg   pO2, Ven 118 (H) 32 - 45 mmHg   Bicarbonate 21.9 20.0 - 28.0 mmol/L   TCO2 23 22  - 32 mmol/L   O2 Saturation 99 %   Acid-base deficit 2.0 0.0 - 2.0 mmol/L   Sodium 144 135 - 145 mmol/L   Potassium 7.9 (HH) 3.5 - 5.1 mmol/L   Calcium, Ion 1.03 (L) 1.15 - 1.40 mmol/L   HCT 30.0 (L) 36.0 - 46.0 %   Hemoglobin 10.2 (L) 12.0 - 15.0 g/dL   Sample type VENOUS    Comment NOTIFIED PHYSICIAN   Lactic acid, plasma     Status: Abnormal   Collection Time: 08/13/22  2:40 PM  Result Value Ref Range   Lactic Acid, Venous 4.9 (HH) 0.5 - 1.9 mmol/L    Comment: CRITICAL RESULT CALLED TO, READ BACK BY AND VERIFIED WITH A TOWSLEY,RN 1545 08/23/2022 WBOND Performed at [facility], [address], [place], [place] [address]   Comprehensive metabolic panel     Status: Abnormal   Collection Time: 08/13/22  2:40 PM  Result Value Ref Range   Sodium 144 135 - 145 mmol/L   Potassium 3.8 3.5 - 5.1 mmol/L   Chloride 116 (H) 98 - 111 mmol/L   CO2 19 (L) 22 - 32 mmol/L   Glucose, Bld 85 70 - 99 mg/dL    Comment: Glucose reference range applies only to samples taken after fasting for at least 8 hours.   BUN 10 8 - 23 mg/dL   Creatinine, Ser 0.98 (H) 0.44 - 1.00 mg/dL   Calcium 7.9 (L) 8.9 - 10.3 mg/dL   Total Protein 4.9 (L) 6.5 - 8.1 g/dL   Albumin 1.7 (L) 3.5 - 5.0 g/dL   AST 19 15 - 41 U/L   ALT 14 0 - 44 U/L   Alkaline Phosphatase 98 38 - 126 U/L   Total Bilirubin 0.9 0.3 - 1.2 mg/dL   GFR, Estimated 49 (L) >60 mL/min    Comment: (NOTE) Calculated using the CKD-EPI Creatinine Equation (2021)    Anion gap 9 5 - 15    Comment: Performed at [facility], [address], [place], [place] [address]  CBC with Differential     Status: Abnormal   Collection Time: 08/13/22  2:40 PM  Result Value Ref Range   WBC 24.6 (H) 4.0 - 10.5 K/uL   RBC 2.66 (L) 3.87 - 5.11 MIL/uL   Hemoglobin 8.5 (L) 12.0 - 15.0 g/dL   HCT 78.2 (L) 95.6 - 21.3 %   MCV 92.9 80.0 - 100.0 fL   MCH 32.0 26.0 - 34.0 pg   MCHC 34.4 30.0 - 36.0 g/dL   RDW 08.6 (H) 57.8 - 46.9 %   Platelets 334 150 - 400  K/uL   nRBC 0.0 0.0 - 0.2 %   Neutrophils Relative % 88 %   Neutro Abs 21.6 (H) 1.7 - 7.7 K/uL   Lymphocytes Relative 4 %   Lymphs Abs 1.0  0.7 - 4.0 K/uL   Monocytes Relative 6 %   Monocytes Absolute 1.5 (H) 0.1 - 1.0 K/uL   Eosinophils Relative 0 %   Eosinophils Absolute 0.0 0.0 - 0.5 K/uL   Basophils Relative 0 %   Basophils Absolute 0.1 0.0 - 0.1 K/uL   Immature Granulocytes 2 %   Abs Immature Granulocytes 0.46 (H) 0.00 - 0.07 K/uL    Comment: Performed at [facility], [address] [place], [place] [address]  Protime-INR     Status: Abnormal   Collection Time: 08/13/22  2:40 PM  Result Value Ref Range   Prothrombin Time 17.3 (H) 11.4 - 15.2 seconds   INR 1.4 (H) 0.8 - 1.2    Comment: (NOTE) INR goal varies based on device and disease states. Performed at [facility], [address] [place], [place] [address]   APTT     Status: Abnormal   Collection Time: 08/13/22  2:40 PM  Result Value Ref Range   aPTT 54 (H) 24 - 36 seconds    Comment:        IF BASELINE aPTT IS ELEVATED, SUGGEST PATIENT RISK ASSESSMENT BE USED TO DETERMINE APPROPRIATE ANTICOAGULANT THERAPY. Performed at [facility], [address], [place], [place] [address]   Blood Culture (routine x 2)     Status: None (Preliminary result)   Collection Time: 08/13/22  2:40 PM   Specimen: BLOOD  Result Value Ref Range   Specimen Description BLOOD LEFT ANTECUBITAL    Special Requests      BOTTLES DRAWN AEROBIC AND ANAEROBIC Blood Culture adequate volume   Culture      NO GROWTH < 24 HOURS Performed at [facility], [address], [place], [place] [address]    Report Status PENDING   Blood Culture (routine x 2)     Status: None (Preliminary result)   Collection Time: 08/13/22  4:43 PM   Specimen: BLOOD  Result Value Ref Range   Specimen Description BLOOD CENTRAL LINE    Special Requests      BOTTLES DRAWN AEROBIC AND ANAEROBIC Blood Culture adequate volume   Culture      NO  GROWTH < 24 HOURS Performed at [facility], [address], [place], [place] [address]    Report Status PENDING   Comprehensive metabolic panel     Status: Abnormal   Collection Time: 08/13/22  6:14 PM  Result Value Ref Range   Sodium 144 135 - 145 mmol/L   Potassium 3.7 3.5 - 5.1 mmol/L   Chloride 115 (H) 98 - 111 mmol/L   CO2 20 (L) 22 - 32 mmol/L   Glucose, Bld 94 70 - 99 mg/dL    Comment: Glucose reference range applies only to samples taken after fasting for at least 8 hours.   BUN 11 8 - 23 mg/dL   Creatinine, Ser 1.30 (H) 0.44 - 1.00 mg/dL   Calcium 7.6 (L) 8.9 - 10.3 mg/dL   Total Protein 4.6 (L) 6.5 - 8.1 g/dL   Albumin 1.6 (L) 3.5 - 5.0 g/dL   AST 18 15 - 41 U/L   ALT 13 0 - 44 U/L   Alkaline Phosphatase 93 38 - 126 U/L   Total Bilirubin 1.2 0.3 - 1.2 mg/dL   GFR, Estimated 49 (L) >60 mL/min    Comment: (NOTE) Calculated using the CKD-EPI Creatinine Equation (2021)    Anion gap 9 5 - 15  Comment: Performed at [facility], [address], [place], [place] [address]  CK     Status: Abnormal   Collection Time: 08/13/22  6:14 PM  Result Value Ref Range   Total CK 29 (L) 38 - 234 U/L    Comment: Performed at [facility], [address] [place], [place] [address]  Procalcitonin - Baseline     Status: None   Collection Time: 08/13/22  6:14 PM  Result Value Ref Range   Procalcitonin 2.50 ng/mL    Comment:        Interpretation: PCT > 2 ng/mL: Systemic infection (sepsis) is likely, unless other causes are known. (NOTE)       Sepsis PCT Algorithm           Lower Respiratory Tract                                      Infection PCT Algorithm    ----------------------------     ----------------------------         PCT < 0.25 ng/mL                PCT < 0.10 ng/mL          Strongly encourage             Strongly discourage   discontinuation of antibiotics    initiation of antibiotics    ----------------------------      -----------------------------       PCT 0.25 - 0.50 ng/mL            PCT 0.10 - 0.25 ng/mL               OR       >80% decrease in PCT            Discourage initiation of                                            antibiotics      Encourage discontinuation           of antibiotics    ----------------------------     -----------------------------         PCT >= 0.50 ng/mL              PCT 0.26 - 0.50 ng/mL               AND       <80% decrease in PCT              Encourage initiation of                                             antibiotics       Encourage continuation           of antibiotics    ----------------------------     -----------------------------        PCT >= 0.50 ng/mL                  PCT > 0.50 ng/mL               AND  increase in PCT                  Strongly encourage                                      initiation of antibiotics    Strongly encourage escalation           of antibiotics                                     -----------------------------                                           PCT <= 0.25 ng/mL                                                 OR                                        > 80% decrease in PCT                                      Discontinue / Do not initiate                                             antibiotics  Performed at [facility], [address] [place], [place] [address]   Lactic acid, plasma     Status: None   Collection Time: 08/13/22  8:04 PM  Result Value Ref Range   Lactic Acid, Venous 1.0 0.5 - 1.9 mmol/L    Comment: Performed at [facility], [address], [place], [place] [address]  Magnesium     Status: None   Collection Time: 08/13/22  8:04 PM  Result Value Ref Range   Magnesium 1.9 1.7 - 2.4 mg/dL    Comment: Performed at [facility], [address] [place], [place] [address]  Osmolality     Status: Abnormal   Collection Time: 08/13/22  8:04 PM  Result Value Ref Range    Osmolality 308 (H) 275 - 295 mOsm/kg    Comment: REPEATED TO VERIFY Performed at [facility], [address] [place], [place] [address]   Phosphorus     Status: None   Collection Time: 08/13/22  8:04 PM  Result Value Ref Range   Phosphorus 3.6 2.5 - 4.6 mg/dL    Comment: Performed at [facility], [address], [place], [place] [address]  Troponin I (High Sensitivity)     Status: None   Collection Time: 08/13/22  8:04 PM  Result Value Ref Range   Troponin I (High Sensitivity) 15 <18 ng/L    Comment: (NOTE) Elevated high sensitivity troponin I (hsTnI) values and significant  changes  across serial measurements may suggest ACS but many other  chronic and acute conditions are known to elevate hsTnI results.  Refer to the "Links" section for chest pain algorithms and additional  guidance. Performed at [facility], [address], [place], [place] [address]   TSH     Status: None   Collection Time: 08/13/22  8:04 PM  Result Value Ref Range   TSH 1.109 0.350 - 4.500 uIU/mL    Comment: Performed by a 3rd Generation assay with a functional sensitivity of <=0.01 uIU/mL. Performed at [facility], [address], [place], [place] [address]   Vitamin B12     Status: None   Collection Time: 08/13/22  8:04 PM  Result Value Ref Range   Vitamin B-12 313 180 - 914 pg/mL    Comment: (NOTE) This assay is not validated for testing neonatal or myeloproliferative syndrome specimens for Vitamin B12 levels. Performed at [facility], [address] [place], [place] [address]   Folate     Status: None   Collection Time: 08/13/22  8:04 PM  Result Value Ref Range   Folate 14.5 >5.9 ng/mL    Comment: Performed at [facility], [address] [place], [place] [address]  Iron and TIBC     Status: Abnormal   Collection Time: 08/13/22  8:04 PM  Result Value Ref Range   Iron 11 (L) 28 - 170 ug/dL   TIBC NOT CALCULATED 295 - 450 ug/dL   Saturation Ratios  NOT CALCULATED 10.4 - 31.8 %   UIBC NOT CALCULATED ug/dL    Comment: Performed at [facility], [address], [place], [place] [address]  Ferritin     Status: Abnormal   Collection Time: 08/13/22  8:04 PM  Result Value Ref Range   Ferritin 315 (H) 11 - 307 ng/mL    Comment: Performed at [facility], [address], [place], [place] [address]  Reticulocytes     Status: Abnormal   Collection Time: 08/13/22  8:04 PM  Result Value Ref Range   Retic Ct Pct 2.4 0.4 - 3.1 %   RBC. 1.99 (L) 3.87 - 5.11 MIL/uL   Retic Count, Absolute 48.4 19.0 - 186.0 K/uL   Immature Retic Fract 29.5 (H) 2.3 - 15.9 %    Comment: Performed at [facility], [address], [place], [place] [address]  HIV Antibody (routine testing w rflx)     Status: None   Collection Time: 08/13/22  8:04 PM  Result Value Ref Range   HIV Screen 4th Generation wRfx Non Reactive Non Reactive    Comment: Performed at [facility], [address], [place], [place] [address]  C-reactive protein     Status: Abnormal   Collection Time: 08/13/22  8:04 PM  Result Value Ref Range   CRP 10.9 (H) <1.0 mg/dL    Comment: Performed at [facility], [address] [place], [place] [address]  Lactate dehydrogenase     Status: Abnormal   Collection Time: 08/13/22  8:04 PM  Result Value Ref Range   LDH 216 (H) 98 - 192 U/L    Comment: Performed at [facility], [address] [place], [place] [address]  Procalcitonin     Status: None   Collection Time: 08/13/22  8:04 PM  Result Value Ref Range   Procalcitonin 2.85 ng/mL    Comment:  Interpretation: PCT > 2 ng/mL: Systemic infection (sepsis) is likely, unless other causes are known. (NOTE)       Sepsis PCT Algorithm           Lower Respiratory Tract                                      Infection PCT Algorithm    ----------------------------     ----------------------------         PCT < 0.25 ng/mL                PCT < 0.10 ng/mL           Strongly encourage             Strongly discourage   discontinuation of antibiotics    initiation of antibiotics    ----------------------------     -----------------------------       PCT 0.25 - 0.50 ng/mL            PCT 0.10 - 0.25 ng/mL               OR       >80% decrease in PCT            Discourage initiation of                                            antibiotics      Encourage discontinuation           of antibiotics    ----------------------------     -----------------------------         PCT >= 0.50 ng/mL              PCT 0.26 - 0.50 ng/mL               AND       <80% decrease in PCT              Encourage initiation of                                             antibiotics       Encourage continuation           of antibiotics    ----------------------------     -----------------------------        PCT >= 0.50 ng/mL                  PCT > 0.50 ng/mL               AND         increase in PCT                  Strongly encourage                                      initiation of antibiotics    Strongly encourage escalation           of antibiotics                                     -----------------------------  PCT <= 0.25 ng/mL                                                 OR                                        > 80% decrease in PCT                                      Discontinue / Do not initiate                                             antibiotics  Performed at [facility], [place] [address], [place], [place] [address]   Cortisol     Status: None   Collection Time: 08/13/22  8:04 PM  Result Value Ref Range   Cortisol, Plasma 24.0 ug/dL    Comment: (NOTE) AM    6.7 - 22.6 ug/dL PM   <10.0       ug/dL Performed at [place] [address], [place], [place] [address]   Lipase, blood     Status: None   Collection Time: 08/13/22  8:04 PM  Result Value Ref Range   Lipase 46 11 - 51 U/L    Comment:  Performed at [place] [address], [place], [place] [address]  Lactic acid, plasma     Status: None   Collection Time: 08/14/22  1:20 AM  Result Value Ref Range   Lactic Acid, Venous 0.9 0.5 - 1.9 mmol/L    Comment: Performed at [place] [address] [place], [place] [address]  Lactic acid, plasma     Status: None   Collection Time: 08/14/22  4:53 AM  Result Value Ref Range   Lactic Acid, Venous 1.4 0.5 - 1.9 mmol/L    Comment: Performed at [place] [address] [place], [place] [address]  Procalcitonin     Status: None   Collection Time: 08/14/22  4:54 AM  Result Value Ref Range   Procalcitonin 3.36 ng/mL    Comment:        Interpretation: PCT > 2 ng/mL: Systemic infection (sepsis) is likely, unless other causes are known. (NOTE)       Sepsis PCT Algorithm           Lower Respiratory Tract                                      Infection PCT Algorithm    ----------------------------     ----------------------------         PCT < 0.25 ng/mL                PCT < 0.10 ng/mL          Strongly encourage             Strongly discourage   discontinuation of antibiotics  initiation of antibiotics    ----------------------------     -----------------------------       PCT 0.25 - 0.50 ng/mL            PCT 0.10 - 0.25 ng/mL               OR       >80% decrease in PCT            Discourage initiation of                                            antibiotics      Encourage discontinuation           of antibiotics    ----------------------------     -----------------------------         PCT >= 0.50 ng/mL              PCT 0.26 - 0.50 ng/mL               AND       <80% decrease in PCT              Encourage initiation of                                             antibiotics       Encourage continuation           of antibiotics    ----------------------------     -----------------------------        PCT >= 0.50 ng/mL                  PCT > 0.50  ng/mL               AND         increase in PCT                  Strongly encourage                                      initiation of antibiotics    Strongly encourage escalation           of antibiotics                                     -----------------------------                                           PCT <= 0.25 ng/mL                                                 OR                                        >  80% decrease in PCT                                      Discontinue / Do not initiate                                             antibiotics  Performed at [facility], [place] [address], [place], [place] [address]   Prealbumin     Status: Abnormal   Collection Time: 08/14/22  4:54 AM  Result Value Ref Range   Prealbumin <5 (L) 18 - 38 mg/dL    Comment: Performed at [facility] [address] [place], [place] [address]  Comprehensive metabolic panel     Status: Abnormal   Collection Time: 08/14/22  4:54 AM  Result Value Ref Range   Sodium 142 135 - 145 mmol/L   Potassium 3.2 (L) 3.5 - 5.1 mmol/L   Chloride 113 (H) 98 - 111 mmol/L   CO2 20 (L) 22 - 32 mmol/L   Glucose, Bld 107 (H) 70 - 99 mg/dL    Comment: Glucose reference range applies only to samples taken after fasting for at least 8 hours.   BUN 14 8 - 23 mg/dL   Creatinine, Ser 1.07 (H) 0.44 - 1.00 mg/dL   Calcium 7.8 (L) 8.9 - 10.3 mg/dL   Total Protein 5.5 (L) 6.5 - 8.1 g/dL   Albumin 2.2 (L) 3.5 - 5.0 g/dL   AST 19 15 - 41 U/L   ALT 14 0 - 44 U/L   Alkaline Phosphatase 99 38 - 126 U/L   Total Bilirubin 0.8 0.3 - 1.2 mg/dL   GFR, Estimated 55 (L) >60 mL/min    Comment: (NOTE) Calculated using the CKD-EPI Creatinine Equation (2021)    Anion gap 9 5 - 15    Comment: Performed at [facility], [place] [address], [place], [place] [address]  CBC with Differential/Platelet     Status: Abnormal   Collection Time: 08/14/22  4:54 AM  Result Value Ref Range   WBC 23.2 (H) 4.0 - 10.5 K/uL    RBC 2.33 (L) 3.87 - 5.11 MIL/uL   Hemoglobin 7.5 (L) 12.0 - 15.0 g/dL   HCT 21.6 (L) 36.0 - 46.0 %   MCV 92.7 80.0 - 100.0 fL   MCH 32.2 26.0 - 34.0 pg   MCHC 34.7 30.0 - 36.0 g/dL   RDW 18.3 (H) 11.5 - 15.5 %   Platelets 300 150 - 400 K/uL   nRBC 0.0 0.0 - 0.2 %   Neutrophils Relative % 86 %   Neutro Abs 19.8 (H) 1.7 - 7.7 K/uL   Lymphocytes Relative 5 %   Lymphs Abs 1.2 0.7 - 4.0 K/uL   Monocytes Relative 8 %   Monocytes Absolute 1.8 (H) 0.1 - 1.0 K/uL   Eosinophils Relative 0 %   Eosinophils Absolute 0.1 0.0 - 0.5 K/uL   Basophils Relative 0 %   Basophils Absolute 0.1 0.0 - 0.1 K/uL   Immature Granulocytes 1 %   Abs Immature Granulocytes 0.24 (H) 0.00 - 0.07 K/uL    Comment: Performed at [facility], [place] [address], [place], [place] [address]  C-reactive protein     Status: Abnormal   Collection Time:  08/14/22  4:54 AM  Result Value Ref Range   CRP 14.5 (H) <1.0 mg/dL    Comment: Performed at [facility], [address] [place], [place] [address]  D-dimer, quantitative     Status: Abnormal   Collection Time: 08/14/22  4:54 AM  Result Value Ref Range   D-Dimer, Quant 1.87 (H) 0.00 - 0.50 ug/mL-FEU    Comment: (NOTE) At the manufacturer cut-off value of 0.5 g/mL FEU, this assay has a negative predictive value of 95-100%.This assay is intended for use in conjunction with a clinical pretest probability (PTP) assessment model to exclude pulmonary embolism (PE) and deep venous thrombosis (DVT) in outpatients suspected of PE or DVT. Results should be correlated with clinical presentation. Performed at [facility], [address], [place], [place] [address]   Ferritin     Status: Abnormal   Collection Time: 08/14/22  4:54 AM  Result Value Ref Range   Ferritin 345 (H) 11 - 307 ng/mL    Comment: Performed at [facility], [address], [place], [place] [address]  Magnesium     Status: None   Collection Time: 08/14/22  4:54 AM  Result Value  Ref Range   Magnesium 1.9 1.7 - 2.4 mg/dL    Comment: Performed at [facility], [address], [place], [place] [address]  Phosphorus     Status: None   Collection Time: 08/14/22  4:54 AM  Result Value Ref Range   Phosphorus 2.9 2.5 - 4.6 mg/dL    Comment: Performed at [facility], [address] [place], [place] [address]  Osmolality, urine     Status: None   Collection Time: 08/14/22  4:56 AM  Result Value Ref Range   Osmolality, Ur 560 300 - 900 mOsm/kg    Comment: REPEATED TO VERIFY Performed at [facility], [address] [place], [place] [address]   Creatinine, urine, random     Status: None   Collection Time: 08/14/22  4:56 AM  Result Value Ref Range   Creatinine, Urine 78 mg/dL    Comment: Performed at [facility], [address] [place], [place] [address]  Sodium, urine, random     Status: None   Collection Time: 08/14/22  4:56 AM  Result Value Ref Range   Sodium, Ur 136 mmol/L    Comment: Performed at [facility], [address], [place], [place] [address]  Strep pneumoniae urinary antigen     Status: None   Collection Time: 08/14/22  4:56 AM  Result Value Ref Range   Strep Pneumo Urinary Antigen NEGATIVE NEGATIVE    Comment:        Infection due to S. pneumoniae cannot be absolutely ruled out since the antigen present may be below the detection limit of the test. Performed at [facility], [address], [place], [place] [address]   Urinalysis, Complete w Microscopic Urine, Clean Catch     Status: Abnormal   Collection Time: 08/14/22  4:57 AM  Result Value Ref Range   Color, Urine YELLOW YELLOW   APPearance CLEAR CLEAR   Specific Gravity, Urine 1.026 1.005 - 1.030   pH 7.0 5.0 - 8.0   Glucose, UA NEGATIVE NEGATIVE mg/dL   Hgb urine dipstick NEGATIVE NEGATIVE   Bilirubin Urine NEGATIVE NEGATIVE   Ketones, ur NEGATIVE NEGATIVE mg/dL   Protein, ur NEGATIVE NEGATIVE mg/dL   Nitrite NEGATIVE NEGATIVE  Leukocytes,Ua NEGATIVE NEGATIVE   RBC / HPF 0-5 0 - 5 RBC/hpf   WBC, UA 0-5 0 - 5 WBC/hpf   Bacteria, UA RARE (A) NONE SEEN   Squamous Epithelial / HPF 0-5 0 - 5 /HPF    Comment: Performed at [facility], [address] [place], [place] [id number]   MR BRAIN WO CONTRAST  Result Date: 08/14/2022 CLINICAL DATA:  Initial evaluation for delirium. EXAM: MRI HEAD WITHOUT CONTRAST TECHNIQUE: Multiplanar, multiecho pulse sequences of the brain and surrounding structures were obtained without intravenous contrast. COMPARISON:  CT from 08/13/2022. FINDINGS: Brain: Examination moderately degraded by motion artifact. Generalized age-related cerebral atrophy. Patchy and confluent T2/FLAIR hyperintensity involving the supratentorial cerebral white matter, most characteristic of chronic microvascular ischemic disease, mild to moderate in nature. No evidence for acute or subacute ischemia. Gray-white matter differentiation maintained. No areas of chronic cortical infarction. No visible acute or chronic intracranial blood products. No mass lesion, midline shift or mass effect. No hydrocephalus or extra-axial fluid collection. Vascular: Major intracranial vascular flow voids are maintained. Skull and upper cervical spine: Craniocervical junction grossly within normal limits. Bone marrow signal intensity grossly normal. No scalp soft tissue abnormality. Sinuses/Orbits: Prior ocular lens replacement on the right. Globes orbital soft tissues demonstrate no acute finding. Mild mucosal thickening about the ethmoidal air cells. No mastoid effusion. Other: None. IMPRESSION: 1. No acute intracranial abnormality. 2. Age-related cerebral atrophy with mild to moderate chronic microvascular ischemic disease. Electronically Signed   By: Rise MuBenjamin  McClintock M.D.   On: 08/14/2022 02:42   CT HEAD WO CONTRAST (5MM)  Result Date: 08/13/2022 CLINICAL DATA:  Mental status change, unknown cause EXAM: CT HEAD WITHOUT CONTRAST  TECHNIQUE: Contiguous axial images were obtained from the base of the skull through the vertex without intravenous contrast. RADIATION DOSE REDUCTION: This exam was performed according to the departmental dose-optimization program which includes automated exposure control, adjustment of the mA and/or kV according to patient size and/or use of iterative reconstruction technique. COMPARISON:  CT head 07/15/2022. FINDINGS: Brain: No evidence of acute infarction, hemorrhage, hydrocephalus, extra-axial collection or mass lesion/mass effect. Vascular: No hyperdense vessel. Skull: No acute fracture. Sinuses/Orbits: Clear sinuses.  No acute orbital findings. Other: No mastoid effusions. IMPRESSION: No evidence of acute intracranial abnormality. Electronically Signed   By: Feliberto HartsFrederick S Jones M.D.   On: 08/13/2022 17:49   CT CHEST ABDOMEN PELVIS W CONTRAST  Result Date: 08/13/2022 CLINICAL DATA:  Sepsis fever, lactic acidosis EXAM: CT CHEST, ABDOMEN, AND PELVIS WITH CONTRAST TECHNIQUE: Multidetector CT imaging of the chest, abdomen and pelvis was performed following the standard protocol during bolus administration of intravenous contrast. RADIATION DOSE REDUCTION: This exam was performed according to the departmental dose-optimization program which includes automated exposure control, adjustment of the mA and/or kV according to patient size and/or use of iterative reconstruction technique. CONTRAST:  75mL OMNIPAQUE IOHEXOL 350 MG/ML SOLN COMPARISON:  None Available. FINDINGS: CT CHEST FINDINGS Cardiovascular: Calcific atherosclerosis of the aorta and coronary arteries. Normal heart size. No pericardial effusion. Mediastinum/Nodes: Moderate size hiatal hernia. Lungs/Pleura: Left lower lobe consolidation. No pleural effusions or pneumothorax. Musculoskeletal: Healing subacute left posterior eleventh rib fracture. CT ABDOMEN PELVIS FINDINGS Hepatobiliary: No focal liver abnormality is seen. Status post cholecystectomy. No  biliary dilatation. Pancreas: Unremarkable. No pancreatic ductal dilatation or surrounding inflammatory changes. Spleen: Normal in size without focal abnormality. Adrenals/Urinary Tract: Adrenal glands are unremarkable. Kidneys are normal, without renal calculi, focal lesion, or hydronephrosis. Bladder is unremarkable. Small amount of ill-defined fluid  adjacent to the right kidney, which appears similar. Stomach/Bowel: No evidence of bowel obstruction. Stool within the colon. No evidence of bowel wall thickening or inflammatory change. Vascular/Lymphatic: Aortic atherosclerosis. No enlarged abdominal or pelvic lymph nodes. Reproductive: Status post hysterectomy. No adnexal masses. Other: Stable chronic ovoid fluid collection along the anterior abdominal wall above the umbilicus. This is associated with a ventral mesh hernia repair. Similar adjacent calcified structure. Musculoskeletal: Degenerative changes, greatest in the lower lumbar spine. IMPRESSION: 1. Left lower lobe consolidation, suspicious for pneumonia and/or aspiration. 2. Healing subacute left posterior eleventh rib fracture. 3. Stable chronic fluid collection along the anterior mesh from prior ventral hernia repair. Moderately sized hiatal hernia. 4.  Aortic Atherosclerosis (ICD10-I70.0). Electronically Signed   By: Feliberto Harts M.D.   On: 08/13/2022 17:38   DG Chest Portable 1 View  Result Date: 08/13/2022 CLINICAL DATA:  Central line placement EXAM: PORTABLE CHEST 1 VIEW COMPARISON:  08/13/2022 at 2:01 p.m. FINDINGS: Right internal jugular central venous catheter present with tip at the brachiocephalic confluence with the SVC. No pneumothorax. Atherosclerotic calcification of the aortic arch. Mild enlargement of the cardiopericardial silhouette noted with retrocardiac opacity compatible with moderate-sized hiatal hernia. IMPRESSION: 1. Right internal jugular central venous catheter tip is at the brachiocephalic confluence with the SVC. No  pneumothorax. 2. Mild enlargement of the cardiopericardial silhouette. 3. Moderate-sized hiatal hernia. Electronically Signed   By: Gaylyn Rong M.D.   On: 08/13/2022 16:10   DG Chest Port 1 View  Result Date: 08/13/2022 CLINICAL DATA:  Altered mental status.  Recent COVID pneumonia EXAM: PORTABLE CHEST 1 VIEW COMPARISON:  Chest radiograph 05/16/2022 FINDINGS: Stable cardiac and mediastinal contours. Rounded retrocardiac density. No pleural effusion or pneumothorax. Thoracic spine degenerative changes. IMPRESSION: There is a rounded retrocardiac density which is nonspecific and may represent the patient's hiatal hernia. Possibility of underlying pulmonary consolidation in the setting of infection not entirely excluded. Electronically Signed   By: Annia Belt M.D.   On: 08/13/2022 14:20    Pending Labs Unresulted Labs (From admission, onward)     Start     Ordered   08/14/22 1229  MRSA Next Gen by PCR, Nasal  (MRSA Screening)  Once,   R        08/14/22 1228   08/14/22 0500  Procalcitonin  Daily,   R      08/13/22 1835   08/14/22 0500  Comprehensive metabolic panel  Daily,   R      08/13/22 1904   08/14/22 0500  CBC with Differential/Platelet  Daily,   R      08/13/22 1904   08/14/22 0500  C-reactive protein  Daily,   R      08/13/22 1904   08/14/22 0500  D-dimer, quantitative  Daily,   R      08/13/22 1904   08/14/22 0500  Ferritin  Daily,   R      08/13/22 1904   08/14/22 0500  Magnesium  Daily,   R      08/13/22 1904   08/14/22 0500  Phosphorus  Daily,   R      08/13/22 1904   08/14/22 0500  CBC  Tomorrow morning,   R       Question:  Release to patient  Answer:  Immediate   08/14/22 0305   08/13/22 1902  Expectorated Sputum Assessment w Gram Stain, Rflx to Resp Cult  Once,   R        08/13/22 1904  08/13/22 1902  Legionella Pneumophila Serogp 1 Ur Ag  Once,   R        08/13/22 1904   08/13/22 1901  Lactic acid, plasma  STAT Now then every 3 hours,   R (with STAT  occurrences)     Question:  Release to patient  Answer:  Immediate   08/13/22 1900   Unscheduled  Occult blood card to lab, stool  As needed,   R      08/13/22 1917            Vitals/Pain Today's Vitals   08/14/22 1030 08/14/22 1115 08/14/22 1124 08/14/22 1130  BP: (!) 146/62 133/77  132/70  Pulse: 93 92  91  Resp: 19 (!) 21  (!) 33  Temp:   97.8 F (36.6 C)   TempSrc:   Oral   SpO2: 97% 97%  100%  Weight:      PainSc:        Isolation Precautions Airborne and Contact precautions  Medications Medications  lactated ringers infusion (0 mLs Intravenous Stopped 08/14/22 0503)  azithromycin (ZITHROMAX) 500 mg in sodium chloride 0.9 % 250 mL IVPB (0 mg Intravenous Stopped 08/13/22 2124)  latanoprost (XALATAN) 0.005 % ophthalmic solution 1 drop (has no administration in time range)  acetaminophen (TYLENOL) tablet 650 mg (has no administration in time range)    Or  acetaminophen (TYLENOL) suppository 650 mg (has no administration in time range)  HYDROcodone-acetaminophen (NORCO/VICODIN) 5-325 MG per tablet 1-2 tablet (has no administration in time range)  vancomycin (VANCOREADY) IVPB 750 mg/150 mL (has no administration in time range)  nicotine (NICODERM CQ - dosed in mg/24 hours) patch 21 mg (21 mg Transdermal Patch Applied 08/14/22 0918)  cefTRIAXone (ROCEPHIN) 1 g in sodium chloride 0.9 % 100 mL IVPB (0 g Intravenous Stopped 08/14/22 0821)  0.45 % sodium chloride infusion ( Intravenous New Bag/Given 08/14/22 0821)  mometasone-formoterol (DULERA) 200-5 MCG/ACT inhaler 2 puff (2 puffs Inhalation Given 08/14/22 0821)  carvedilol (COREG) tablet 12.5 mg (12.5 mg Oral Given 08/14/22 0822)  fesoterodine (TOVIAZ) tablet 4 mg (4 mg Oral Given 08/14/22 0918)  traMADol (ULTRAM) tablet 50 mg (has no administration in time range)  amLODipine (NORVASC) tablet 5 mg (5 mg Oral Given 08/14/22 0918)  lactated ringers bolus 1,000 mL (0 mLs Intravenous Stopped 08/13/22 1502)    And  lactated ringers  bolus 1,000 mL (0 mLs Intravenous Stopped 08/13/22 1626)    And  lactated ringers bolus 500 mL (0 mLs Intravenous Stopped 08/13/22 1655)  aztreonam (AZACTAM) 2 g in sodium chloride 0.9 % 100 mL IVPB (0 g Intravenous Stopped 08/13/22 1626)  metroNIDAZOLE (FLAGYL) IVPB 500 mg (0 mg Intravenous Stopped 08/13/22 1850)  acetaminophen (TYLENOL) suppository 650 mg (650 mg Rectal Given 08/13/22 1412)  vancomycin (VANCOREADY) IVPB 1500 mg/300 mL (0 mg Intravenous Stopped 08/13/22 1850)  iohexol (OMNIPAQUE) 350 MG/ML injection 75 mL (75 mLs Intravenous Contrast Given 08/13/22 1727)  albumin human 5 % solution 12.5 g (0 g Intravenous Stopped 08/14/22 0447)  potassium chloride SA (KLOR-CON M) CR tablet 40 mEq (40 mEq Oral Given 08/14/22 1114)    Mobility non-ambulatory High fall risk   Focused Assessments Neuro Assessment Handoff:  Swallow screen pass? Yes          Neuro Assessment:   Neuro Checks:      Has TPA been given? No If patient is a Neuro Trauma and patient is going to OR before floor call report to 4N Charge nurse: [phone] or  [phone]   R Recommendations: See Admitting Provider Note  Report given to:   Additional Notes: Pt is COVID pos

## 2022-08-14 NOTE — Evaluation (Addendum)
Clinical/Bedside Swallow Evaluation Patient Details  Name: Stephanie Benton MRN: [id number] Date of Birth: [birth year]  Today's Date: 08/14/2022 Time: SLP Start Time (ACUTE ONLY): 0938 SLP Stop Time (ACUTE ONLY): 1829 SLP Time Calculation (min) (ACUTE ONLY): 10 min  Past Medical History:  Past Medical History:  Diagnosis Date   Hyperlipidemia    Hypertension    Prolonged QT interval 06/21/2022   Past Surgical History:  Past Surgical History:  Procedure Laterality Date   ABDOMINAL HYSTERECTOMY     APPENDECTOMY     BIOPSY  05/17/2022   Procedure: BIOPSY;  Surgeon: Carol Ada, MD;  Location: [facility] ENDOSCOPY;  Service: Gastroenterology;;   CHOLECYSTECTOMY N/A 05/22/2022   Procedure: LAPAROSCOPIC CHOLECYSTECTOMY WITH INTRAOPERATIVE CHOLANGIOGRAM;  Surgeon: Coralie Keens, MD;  Location: [facility];  Service: General;  Laterality: N/A;   ESOPHAGOGASTRODUODENOSCOPY (EGD) WITH PROPOFOL N/A 05/17/2022   Procedure: ESOPHAGOGASTRODUODENOSCOPY (EGD) WITH PROPOFOL;  Surgeon: Carol Ada, MD;  Location: [facility];  Service: Gastroenterology;  Laterality: N/A;   ESOPHAGOGASTRODUODENOSCOPY (EGD) WITH PROPOFOL N/A 07/12/2022   Procedure: ESOPHAGOGASTRODUODENOSCOPY (EGD) WITH PROPOFOL;  Surgeon: Carol Ada, MD;  Location: [facility] ENDOSCOPY;  Service: Gastroenterology;  Laterality: N/A;   HERNIA REPAIR     TONSILLECTOMY     TUBAL LIGATION     HPI:  Pt is a 72 y/o female admitted for CAP and AMS in setting of recent COVID-19. PMH: HTN, HLD, chronic abdominal pain w/ pancreatitis and esophagitis.    Assessment / Plan / Recommendation  Clinical Impression  Pt alert, with normal oromotor movement and endentulous. Tongue noted to have a black coloration on left side and endentulous. Cough was weak. Trials of thin, puree and graham cracker most noteable for increased work of breathing likely due to Covid. She had a delayed swallow and suspect attempting to coordinate swallow with respirations. There were  multiple swallows but no cough, throat clear or wet vocal quality. She was given choice between puree and a minced texture and therapist agrees with her decision of puree (Dys 1) for energy and respiratory conservation. ST will continue to see for safety and ability to upgrade texture. SLP Visit Diagnosis: Dysphagia, unspecified (R13.10)    Aspiration Risk  Mild aspiration risk    Diet Recommendation Dysphagia 1 (Puree);Thin liquid   Liquid Administration via: Straw;Cup Medication Administration: Whole meds with liquid Supervision: Staff to assist with self feeding Compensations: Slow rate;Small sips/bites Postural Changes: Seated upright at 90 degrees    Other  Recommendations Oral Care Recommendations: Oral care BID    Recommendations for follow up therapy are one component of a multi-disciplinary discharge planning process, led by the attending physician.  Recommendations may be updated based on patient status, additional functional criteria and insurance authorization.  Follow up Recommendations  (TBD)      Assistance Recommended at Discharge    Functional Status Assessment Patient has had a recent decline in their functional status and demonstrates the ability to make significant improvements in function in a reasonable and predictable amount of time.  Frequency and Duration min 1 x/week  2 weeks       Prognosis Prognosis for Safe Diet Advancement: Good      Swallow Study   General HPI: Pt is a 72 y/o female admitted for CAP and AMS in setting of recent COVID-19. PMH: HTN, HLD, chronic abdominal pain w/ pancreatitis and esophagitis. Type of Study: Bedside Swallow Evaluation Previous Swallow Assessment:  (none) Diet Prior to this Study: NPO Temperature Spikes Noted: No Respiratory Status: Nasal cannula  History of Recent Intubation: No Behavior/Cognition: Alert;Cooperative;Pleasant mood Oral Cavity Assessment: Within Functional Limits Oral Care Completed by SLP: No Oral  Cavity - Dentition: Edentulous Vision: Functional for self-feeding Self-Feeding Abilities: Needs assist Patient Positioning: Upright in bed Baseline Vocal Quality: Normal Volitional Cough: Weak Volitional Swallow: Able to elicit    Oral/Motor/Sensory Function Overall Oral Motor/Sensory Function: Within functional limits   Ice Chips Ice chips: Not tested   Thin Liquid Thin Liquid: Impaired Presentation: Straw Pharyngeal  Phase Impairments: Multiple swallows    Nectar Thick Nectar Thick Liquid: Not tested   Honey Thick Honey Thick Liquid: Not tested   Puree Puree: Impaired Pharyngeal Phase Impairments: Suspected delayed Swallow;Multiple swallows   Solid     Solid: Impaired Oral Phase Impairments: Reduced lingual movement/coordination      Houston Siren 08/14/2022,9:59 AM

## 2022-08-14 NOTE — Progress Notes (Signed)
   08/14/22 1505  Assess: MEWS Score  Temp (!) 97.5 F (36.4 C)  BP (!) 143/74  MAP (mmHg) 95  Pulse Rate (!) 101  ECG Heart Rate (!) 101  Resp (!) 22  Level of Consciousness Alert  SpO2 100 %  O2 Device Nasal Cannula  O2 Flow Rate (L/min) 1 L/min  Assess: MEWS Score  MEWS Temp 0  MEWS Systolic 0  MEWS Pulse 1  MEWS RR 1  MEWS LOC 0  MEWS Score 2  MEWS Score Color Yellow  Assess: if the MEWS score is Yellow or Red  Were vital signs taken at a resting state? Yes  Focused Assessment No change from prior assessment  Does the patient meet 2 or more of the SIRS criteria? Yes  Does the patient have a confirmed or suspected source of infection? Yes  Provider and Rapid Response Notified? No  MEWS guidelines implemented *See Row Information* Yes  Treat  Pain Scale 0-10  Pain Score 0  Take Vital Signs  Increase Vital Sign Frequency  Yellow: Q 2hr X 2 then Q 4hr X 2, if remains yellow, continue Q 4hrs  Escalate  MEWS: Escalate Yellow: discuss with charge nurse/RN and consider discussing with provider and RRT  Notify: Charge Nurse/RN  Name of Charge Nurse/RN Notified Sarah RN  Date Charge Nurse/RN Notified 08/14/22  Time Charge Nurse/RN Notified 1505  Document  Patient Outcome Other (Comment) (Pt just admitted with sepsis. Will monitor.)  Progress note created (see row info) Yes  Assess: SIRS CRITERIA  SIRS Temperature  0  SIRS Pulse 1  SIRS Respirations  1  SIRS WBC 1  SIRS Score Sum  3

## 2022-08-15 DIAGNOSIS — U071 COVID-19: Secondary | ICD-10-CM | POA: Diagnosis not present

## 2022-08-15 DIAGNOSIS — Z515 Encounter for palliative care: Secondary | ICD-10-CM | POA: Diagnosis not present

## 2022-08-15 DIAGNOSIS — Z7189 Other specified counseling: Secondary | ICD-10-CM | POA: Diagnosis not present

## 2022-08-15 DIAGNOSIS — J189 Pneumonia, unspecified organism: Secondary | ICD-10-CM | POA: Diagnosis not present

## 2022-08-15 DIAGNOSIS — A419 Sepsis, unspecified organism: Secondary | ICD-10-CM | POA: Diagnosis not present

## 2022-08-15 DIAGNOSIS — Z66 Do not resuscitate: Secondary | ICD-10-CM | POA: Diagnosis not present

## 2022-08-15 DIAGNOSIS — N179 Acute kidney failure, unspecified: Secondary | ICD-10-CM | POA: Diagnosis not present

## 2022-08-15 LAB — CBC WITH DIFFERENTIAL/PLATELET
Abs Immature Granulocytes: 0.12 10*3/uL — ABNORMAL HIGH (ref 0.00–0.07)
Basophils Absolute: 0.1 10*3/uL (ref 0.0–0.1)
Basophils Relative: 0 %
Eosinophils Absolute: 0.2 10*3/uL (ref 0.0–0.5)
Eosinophils Relative: 1 %
HCT: 23.5 % — ABNORMAL LOW (ref 36.0–46.0)
Hemoglobin: 7.7 g/dL — ABNORMAL LOW (ref 12.0–15.0)
Immature Granulocytes: 1 %
Lymphocytes Relative: 7 %
Lymphs Abs: 1.2 10*3/uL (ref 0.7–4.0)
MCH: 29.3 pg (ref 26.0–34.0)
MCHC: 32.8 g/dL (ref 30.0–36.0)
MCV: 89.4 fL (ref 80.0–100.0)
Monocytes Absolute: 1.2 10*3/uL — ABNORMAL HIGH (ref 0.1–1.0)
Monocytes Relative: 8 %
Neutro Abs: 13.1 10*3/uL — ABNORMAL HIGH (ref 1.7–7.7)
Neutrophils Relative %: 83 %
Platelets: 346 10*3/uL (ref 150–400)
RBC: 2.63 MIL/uL — ABNORMAL LOW (ref 3.87–5.11)
RDW: 18.1 % — ABNORMAL HIGH (ref 11.5–15.5)
WBC: 15.9 10*3/uL — ABNORMAL HIGH (ref 4.0–10.5)
nRBC: 0 % (ref 0.0–0.2)

## 2022-08-15 LAB — COMPREHENSIVE METABOLIC PANEL
ALT: 14 U/L (ref 0–44)
AST: 16 U/L (ref 15–41)
Albumin: 2 g/dL — ABNORMAL LOW (ref 3.5–5.0)
Alkaline Phosphatase: 109 U/L (ref 38–126)
Anion gap: 7 (ref 5–15)
BUN: 10 mg/dL (ref 8–23)
CO2: 20 mmol/L — ABNORMAL LOW (ref 22–32)
Calcium: 8.2 mg/dL — ABNORMAL LOW (ref 8.9–10.3)
Chloride: 118 mmol/L — ABNORMAL HIGH (ref 98–111)
Creatinine, Ser: 0.94 mg/dL (ref 0.44–1.00)
GFR, Estimated: >60 mL/min (ref >60)
Glucose, Bld: 81 mg/dL (ref 70–99)
Potassium: 3.5 mmol/L (ref 3.5–5.1)
Sodium: 145 mmol/L (ref 135–145)
Total Bilirubin: 0.8 mg/dL (ref 0.3–1.2)
Total Protein: 5.3 g/dL — ABNORMAL LOW (ref 6.5–8.1)

## 2022-08-15 LAB — GLUCOSE, CAPILLARY
Glucose-Capillary: 92 mg/dL (ref 70–99)
Glucose-Capillary: 93 mg/dL (ref 70–99)
Glucose-Capillary: 95 mg/dL (ref 70–99)
Glucose-Capillary: 99 mg/dL (ref 70–99)

## 2022-08-15 LAB — C-REACTIVE PROTEIN: CRP: 12.7 mg/dL — ABNORMAL HIGH (ref <1.0)

## 2022-08-15 LAB — LEGIONELLA PNEUMOPHILA SEROGP 1 UR AG: L. pneumophila Serogp 1 Ur Ag: NEGATIVE

## 2022-08-15 LAB — FERRITIN: Ferritin: 347 ng/mL — ABNORMAL HIGH (ref 11–307)

## 2022-08-15 LAB — PHOSPHORUS: Phosphorus: 2.7 mg/dL (ref 2.5–4.6)

## 2022-08-15 LAB — MAGNESIUM: Magnesium: 1.9 mg/dL (ref 1.7–2.4)

## 2022-08-15 LAB — PROCALCITONIN: Procalcitonin: 2.46 ng/mL

## 2022-08-15 MED ORDER — SODIUM CHLORIDE 0.45 % IV SOLN: INTRAVENOUS | Status: DC

## 2022-08-15 MED ORDER — BOOST / RESOURCE BREEZE PO LIQD CUSTOM
1.0000 | Freq: Three times a day (TID) | ORAL | Status: DC
  Administered 2022-08-15 – 2022-08-17 (×6): 1 via ORAL

## 2022-08-15 MED ORDER — ADULT MULTIVITAMIN W/MINERALS CH
1.0000 | ORAL_TABLET | Freq: Every day | ORAL | Status: DC
  Administered 2022-08-16: 1 via ORAL
  Filled 2022-08-15 (×2): qty 1

## 2022-08-15 MED ORDER — VITAMIN D (ERGOCALCIFEROL) 1.25 MG (50000 UNIT) PO CAPS: 50000.0000 [IU] | ORAL_CAPSULE | ORAL | Status: DC

## 2022-08-15 MED ORDER — PROSOURCE PLUS PO LIQD
30.0000 mL | Freq: Two times a day (BID) | ORAL | Status: DC
  Administered 2022-08-16 – 2022-08-17 (×2): 30 mL via ORAL
  Filled 2022-08-15 (×3): qty 30

## 2022-08-15 MED ORDER — METOPROLOL TARTRATE 5 MG/5ML IV SOLN: 5.0000 mg | Freq: Four times a day (QID) | INTRAVENOUS | Status: DC | PRN

## 2022-08-15 MED ORDER — SODIUM CHLORIDE 0.45 % IV SOLN: INTRAVENOUS | Status: AC

## 2022-08-15 MED ORDER — PROSIGHT PO TABS
1.0000 | ORAL_TABLET | Freq: Every day | ORAL | Status: DC
  Administered 2022-08-15 – 2022-08-17 (×3): 1 via ORAL
  Filled 2022-08-15 (×4): qty 1

## 2022-08-15 MED ORDER — VITAMIN C 500 MG PO TABS
500.0000 mg | ORAL_TABLET | Freq: Every day | ORAL | Status: DC
  Administered 2022-08-16 – 2022-08-17 (×2): 500 mg via ORAL
  Filled 2022-08-15 (×2): qty 1

## 2022-08-15 MED ORDER — VITAMIN E 180 MG (400 UNIT) PO CAPS
400.0000 [IU] | ORAL_CAPSULE | Freq: Every day | ORAL | Status: DC
  Administered 2022-08-16 – 2022-08-17 (×2): 400 [IU] via ORAL
  Filled 2022-08-15 (×3): qty 1

## 2022-08-15 MED ORDER — POTASSIUM CHLORIDE CRYS ER 20 MEQ PO TBCR
20.0000 meq | EXTENDED_RELEASE_TABLET | Freq: Two times a day (BID) | ORAL | Status: DC
  Administered 2022-08-15 – 2022-08-16 (×3): 20 meq via ORAL
  Filled 2022-08-15 (×3): qty 1

## 2022-08-15 MED ORDER — OCUVITE-LUTEIN PO CAPS: 1.0000 | ORAL_CAPSULE | Freq: Every day | ORAL | Status: DC

## 2022-08-15 MED ORDER — SODIUM CHLORIDE 0.9 % IV SOLN
500.0000 mg | INTRAVENOUS | Status: DC
  Filled 2022-08-15: qty 5

## 2022-08-15 NOTE — NC FL2 (Signed)
[place] LEVEL OF CARE FORM     IDENTIFICATION  Patient Name: Stephanie Benton Birthdate: 1951-02-01 Sex: female Admission Date (Current Location): 08/13/2022  [facility] and [place] Number:  Herbalist and Address:  The . [facility], [place] [address] [place], [place] [address]      Provider Number: [id number]  Attending Physician Name and Address:  Charlynne Cousins, MD  Relative Name and Phone Number:  [name] [phone]) [phone]    Current Level of Care: Hospital Recommended Level of Care: [place] Prior Approval Number:    Date Approved/Denied:   PASRR Number: [id number]  Discharge Plan: SNF    Current Diagnoses: Patient Active Problem List   Diagnosis Date Noted   CAP (community acquired pneumonia) 08/13/2022   Tobacco abuse 08/13/2022   Malnutrition of moderate degree 07/17/2022   Severe sepsis ([place]) 07/16/2022   Hypothermia 07/16/2022   Rhabdomyolysis 23/55/7322   Acute metabolic encephalopathy 02/54/2706   Elevated liver enzymes 07/16/2022   Acute on chronic pancreatitis ([place]) 06/23/2022   Hypokalemia 06/21/2022   COPD (chronic obstructive pulmonary disease) ([place]) 06/21/2022   Glaucoma 06/21/2022   Abdominal pain, chronic, epigastric    LFTs abnormal    Pancreatitis 23/76/2831   Metabolic acidosis 51/76/1607   Esophagitis determined by endoscopy 05/17/2022   Dehydration 05/16/2022   Sleep disturbance 08/21/2021   Spondylolisthesis at L5-S1 level 04/25/2021   Primary osteoarthritis of left knee 05/12/2018   Insomnia due to mental condition 05/08/2018   Chronic bilateral low back pain 03/25/2018   Tobacco abuse counseling 02/18/2018   Hyperkalemia 02/18/2018   Chest pain 02/17/2018   COPD with acute exacerbation ([place]) 02/17/2018   Hypertension 02/17/2018   Depression with anxiety 02/17/2018   AKI (acute kidney injury) ([place])    Anemia    Bipolar disorder, in partial remission, most recent episode  mixed ([place]) 07/04/2017   Osteopenia of multiple sites 07/04/2017   Bipolar disorder, in full remission, most recent episode mixed ([place]) 07/04/2017   Bipolar disorder, in partial remission, most recent episode depressed ([place]) 07/04/2017   Cigarette smoker 06/12/2017   Bilateral hip pain 05/01/2017   B12 deficiency 03/04/2017   Spondylosis of lumbar region without myelopathy or radiculopathy 08/08/2016   Risk for falls 05/06/2016   Hypercholesterolemia 02/21/2016    Orientation RESPIRATION BLADDER Height & Weight     Self, Place  O2 (2 liters Nasal Cannula) Incontinent Weight: 190 lb (86.2 kg) Height:     BEHAVIORAL SYMPTOMS/MOOD NEUROLOGICAL BOWEL NUTRITION STATUS      Continent (WDL) Diet (Please see discharge summary)  AMBULATORY STATUS COMMUNICATION OF NEEDS Skin   Extensive Assist Verbally Other (Comment) (Appropriate for ethnicity,dry,abrasion,knee,R,wound/Incision (LDAs), Wound/Incision open or dehiced Irritant dermatitis (MASD),buttocks,medial,Wound/Incision open or dehiced Irritant dermatitis (MASD) coccyx,medial)                       Personal Care Assistance Level of Assistance  Bathing, Feeding, Dressing Bathing Assistance: Maximum assistance Feeding assistance: Maximum assistance Dressing Assistance: Maximum assistance     Functional Limitations Info  Sight, Hearing, Speech Sight Info: Adequate Hearing Info: Adequate Speech Info: Adequate    SPECIAL CARE FACTORS FREQUENCY  PT (By licensed PT), OT (By licensed OT)     PT Frequency: 5x min weekly OT Frequency: 5x min weekly            Contractures Contractures Info: Not present    Additional Factors Info  Code Status, Allergies, Isolation Precautions  Code Status Info: DNR Allergies Info: Fish Allergy,Morphine And Related,Belsomra (suvorexant),Codeine,Duragesic-100 (fentanyl),Motrin (ibuprofen),Nsaids,Penicillins,Sulfa Antibiotics,Tolectin (tolmetin),     Isolation Precautions Info: MRSA onset date  08/14/2022     Current Medications (08/15/2022):  This is the current hospital active medication list Current Facility-Administered Medications  Medication Dose Route Frequency Provider Last Rate Last Admin   0.45 % sodium chloride infusion   Intravenous Continuous Charlynne Cousins, MD 50 mL/hr at 08/15/22 1258 New Bag at 08/15/22 1258   acetaminophen (TYLENOL) tablet 650 mg  650 mg Oral Q6H PRN Toy Baker, MD       Or   acetaminophen (TYLENOL) suppository 650 mg  650 mg Rectal Q6H PRN Doutova, [name], MD       amLODipine (NORVASC) tablet 5 mg  5 mg Oral Daily Charlynne Cousins, MD   5 mg at 08/15/22 0904   azithromycin (ZITHROMAX) 500 mg in sodium chloride 0.9 % 250 mL IVPB  500 mg Intravenous Q24H Doutova, [name], MD 250 mL/hr at 08/14/22 1818 500 mg at 08/14/22 1818   carvedilol (COREG) tablet 12.5 mg  12.5 mg Oral BID WC Charlynne Cousins, MD   12.5 mg at 08/15/22 0900   cefTRIAXone (ROCEPHIN) 1 g in sodium chloride 0.9 % 100 mL IVPB  1 g Intravenous Q24H Charlynne Cousins, MD 200 mL/hr at 08/15/22 0902 1 g at 08/15/22 0902   Chlorhexidine Gluconate Cloth 2 % PADS 6 each  6 each Topical Daily Toy Baker, MD   6 each at 08/15/22 0904   fesoterodine (TOVIAZ) tablet 4 mg  4 mg Oral Daily Charlynne Cousins, MD   4 mg at 08/15/22 0947   Gerhardt's butt cream 1 Application  1 Application Topical TID Toy Baker, MD   1 Application at 09/62/83 0904   HYDROcodone-acetaminophen (NORCO/VICODIN) 5-325 MG per tablet 1-2 tablet  1-2 tablet Oral Q4H PRN Doutova, [name], MD       latanoprost (XALATAN) 0.005 % ophthalmic solution 1 drop  1 drop Both Eyes QHS Doutova, [name], MD   1 drop at 08/14/22 2125   metoprolol tartrate (LOPRESSOR) injection 5 mg  5 mg Intravenous Q6H PRN Charlynne Cousins, MD       mometasone-formoterol [facility]) 200-5 MCG/ACT inhaler 2 puff  2 puff Inhalation BID Charlynne Cousins, MD   2 puff at 08/15/22 1300   nicotine  (NICODERM CQ - dosed in mg/24 hours) patch 21 mg  21 mg Transdermal Daily Doutova, [name], MD   21 mg at 08/15/22 0902   potassium chloride SA (KLOR-CON M) CR tablet 20 mEq  20 mEq Oral BID Charlynne Cousins, MD   20 mEq at 08/15/22 1257   traMADol (ULTRAM) tablet 50 mg  50 mg Oral Q8H PRN Charlynne Cousins, MD   50 mg at 08/14/22 2124   vancomycin (VANCOREADY) IVPB 750 mg/150 mL  750 mg Intravenous Q24H Toy Baker, MD 150 mL/hr at 08/14/22 1627 750 mg at 08/14/22 1627     Discharge Medications: Please see discharge summary for a list of discharge medications.  Relevant Imaging Results:  Relevant Lab Results:   Additional Information [phone]  Milas Gain, LCSWA

## 2022-08-15 NOTE — TOC Initial Note (Signed)
Transition of Care [facility]) - Initial/Assessment Note    Patient Details  Name: Stephanie Benton MRN: [id number] Date of Birth: 1951-04-08  Transition [facility]) CM/SW Contact:    Milas Gain, [place] Phone Number: 08/15/2022, 10:19 AM  Clinical Narrative:                  CSW received consult for possible SNF placement at time of discharge. Due to patients current orientation CSW spoke with patients son [name] regarding PT recommendation of SNF placement at time of discharge. Patients son reports patient came from [facility] short term and that is plan is for patient to return back to [facility] for short term rehab.  CSW discussed insurance authorization process .  Patients son gave CSW permission to fax over initial referral to [facility].No further questions reported at this time. CSW to continue to follow and assist with discharge planning needs.   Expected Discharge Plan: Skilled Nursing Facility Barriers to Discharge: Continued Medical Work up   Patient Goals and CMS Choice   CMS Medicare.gov Compare Post Acute Care list provided to:: Patient Represenative (must comment) (Patients son [name]) Choice offered to / list presented to : Adult Children (Patients son)      Expected Discharge Plan and Services In-house Referral: Clinical Social Work     Living arrangements for the past 2 months:  (Patient came from [facility] short term)                                      Prior Living Arrangements/Services Living arrangements for the past 2 months:  (Patient came from [facility] short term)   Patient language and need for interpreter reviewed:: Yes Do you feel safe going back to the place where you live?: No   SNF  Need for Family Participation in Patient Care: Yes (Comment) Care giver support system in place?: Yes (comment)   Criminal Activity/Legal Involvement Pertinent to Current Situation/Hospitalization: No - Comment as needed  Activities of Daily Living      Permission  Sought/Granted Permission sought to share information with : Case Manager, Family Supports, Customer service manager Permission granted to share information with : Yes, Verbal Permission Granted  Share Information with NAME: [name]  Permission granted to share info w AGENCY: SNF  Permission granted to share info w Relationship: son  Permission granted to share info w Contact Information: [name] [phone]  Emotional Assessment       Orientation: : Oriented to Self, Oriented to Place Alcohol / Substance Use: Not Applicable Psych Involvement: No (comment)  Admission diagnosis:  CAP (community acquired pneumonia) [J18.9] Pneumonia of left lower lobe due to infectious organism [J18.9] Sepsis without acute organ dysfunction, due to unspecified organism ([place]) [A41.9] COVID-19 [U07.1] Patient Active Problem List   Diagnosis Date Noted   CAP (community acquired pneumonia) 08/13/2022   Tobacco abuse 08/13/2022   Malnutrition of moderate degree 07/17/2022   Severe sepsis ([place]) 07/16/2022   Hypothermia 07/16/2022   Rhabdomyolysis 40/76/8088   Acute metabolic encephalopathy 06/07/1593   Elevated liver enzymes 07/16/2022   Acute on chronic pancreatitis ([place]) 06/23/2022   Hypokalemia 06/21/2022   COPD (chronic obstructive pulmonary disease) ([place]) 06/21/2022   Glaucoma 06/21/2022   Abdominal pain, chronic, epigastric    LFTs abnormal    Pancreatitis 58/59/2924   Metabolic acidosis 46/28/6381   Esophagitis determined by endoscopy 05/17/2022   Dehydration 05/16/2022   Sleep disturbance 08/21/2021  Spondylolisthesis at L5-S1 level 04/25/2021   Primary osteoarthritis of left knee 05/12/2018   Insomnia due to mental condition 05/08/2018   Chronic bilateral low back pain 03/25/2018   Tobacco abuse counseling 02/18/2018   Hyperkalemia 02/18/2018   Chest pain 02/17/2018   COPD with acute exacerbation ([place]) 02/17/2018   Hypertension 02/17/2018   Depression with anxiety 02/17/2018    AKI (acute kidney injury) ([place])    Anemia    Bipolar disorder, in partial remission, most recent episode mixed ([place]) 07/04/2017   Osteopenia of multiple sites 07/04/2017   Bipolar disorder, in full remission, most recent episode mixed ([place]) 07/04/2017   Bipolar disorder, in partial remission, most recent episode depressed ([place]) 07/04/2017   Cigarette smoker 06/12/2017   Bilateral hip pain 05/01/2017   B12 deficiency 03/04/2017   Spondylosis of lumbar region without myelopathy or radiculopathy 08/08/2016   Risk for falls 05/06/2016   Hypercholesterolemia 02/21/2016   PCP:  [name], MD Pharmacy:   [facility] [place], [address] [place] [address] Phone: [phone] Fax: [phone]     Social Determinants of Health ([place]) Social History: [place]: No Food Insecurity (07/09/2022)  Housing: Low Risk  (07/09/2022)  Recent Concern: Housing - Medium Risk (06/21/2022)  Transportation Needs: No Transportation Needs (07/09/2022)  Recent Concern: Transportation Needs - Unmet Transportation Needs (06/21/2022)  Utilities: Not At Risk (07/09/2022)  Tobacco Use: High Risk (07/15/2022)   SDOH Interventions:     Readmission Risk Interventions    07/22/2022   11:58 AM 07/18/2022    8:43 AM 07/10/2022   10:49 AM  Readmission Risk Prevention Plan  Transportation Screening  Complete Complete  Medication Review ([place])  Complete Complete  PCP or Specialist appointment within 3-5 days of discharge  Complete Complete  HRI or Home Care Consult  Complete Complete  SW Recovery Care/Counseling Consult  Complete Complete  Palliative Care Screening  Not Applicable Not Applicable  [place] Complete Not Applicable

## 2022-08-15 NOTE — Progress Notes (Signed)
[facility] PROGRESS NOTE    Progress Note  [name]  XHB:[id number] DOB: [birth year] DOA: 08/13/2022 PCP: Sharmon Revere, MD     Brief Narrative:   Stephanie Benton is an 72 y.o. female past medical history of hypertension, chronic abdominal pain pancreatitis and esophagitis, recently discharged from the hospital from sepsis last month due to community-acquired pneumonia.  Was febrile hypotensive as well as hypoxic requiring a nonrebreather   Assessment/Plan:   Severe sepsis with acute organ dysfunction due to CAP (community acquired pneumonia) Continue IV Rocephin and azithromycin. Blood culture has been negative till date. Has remained afebrile leukocytosis improved. Concern about postviral pneumonia  Acute respiratory failure with hypoxia: She still on 2 L of oxygen to keep saturations greater 93%. Due to community-acquired pneumonia, out of bed to chair continue work with physical therapy. Will need to ambulate and check saturations with ambulation.  COVID-19 infection: Recently diagnosed she does not know when to visit.  Acute metabolic encephalopathy: Likely due to infectious etiology. Started on IV fluids and antibiotics. Now resolved.  Acute kidney injury: Likely due to sepsis, with a baseline creatinine of less than than 1. Creatinine improved with fluid resuscitation. Cont IV for an additional 24 hrs.  Normocytic anemia: Hemoglobin is around 8.5-7.5. Baseline is around 8. Continue to monitor.  Bipolar disorder: In full remission continue Current home medication.  Essential hypertension: Restart Coreg hold ARB and torsemide. Started on low-dose Coreg continue to monitor blood pressure.  Tobacco abuse: Noted.  Hypokalemia: Replete orally  DVT prophylaxis: SCD Family Communication:none Status is: Inpatient Remains inpatient appropriate because: Acute respiratory failure/severe sepsis due to pneumonia.    Code Status:     Code  Status Orders  (From admission, onward)           Start     Ordered   08/13/22 1932  Do not attempt resuscitation (DNR)  Continuous       Question Answer Comment  If patient has no pulse and is not breathing Do Not Attempt Resuscitation   If patient has a pulse and/or is breathing: Medical Treatment Goals LIMITED ADDITIONAL INTERVENTIONS: Use medication/IV fluids and cardiac monitoring as indicated; Do not use intubation or mechanical ventilation (DNI), also provide comfort medications.  Transfer to Progressive/Stepdown as indicated, avoid Intensive Care.   Consent: Discussion documented in EHR or advanced directives reviewed      08/13/22 1932           Code Status History     Date Active Date Inactive Code Status Order ID Comments User Context   07/20/2022 1729 07/22/2022 1925 DNR [id number]  Lorin Glass, MD Inpatient   07/16/2022 0445 07/20/2022 1729 Full Code [id number]  John Giovanni, MD ED   07/08/2022 0840 07/13/2022 1806 Full Code [id number]  Bobette Mo, MD ED   06/21/2022 1753 06/25/2022 1934 Full Code [id number]  Bobette Mo, MD Inpatient   06/21/2022 1555 06/21/2022 1753 Full Code [id number]  Bobette Mo, MD ED   05/17/2022 0147 05/23/2022 1715 Full Code [id number]  Therisa Doyne, MD ED   02/17/2018 0446 02/18/2018 1413 Full Code [id number]  Briscoe Deutscher, MD Inpatient         IV Access:   Peripheral IV   Procedures and diagnostic studies:   MR BRAIN WO CONTRAST  Result Date: 08/14/2022 CLINICAL DATA:  Initial evaluation for delirium. EXAM: MRI HEAD WITHOUT CONTRAST TECHNIQUE: Multiplanar, multiecho pulse sequences of the brain and surrounding structures were obtained without intravenous contrast. COMPARISON:  CT from 08/13/2022. FINDINGS: Brain: Examination moderately degraded by motion artifact. Generalized age-related cerebral atrophy. Patchy and confluent T2/FLAIR hyperintensity involving the supratentorial cerebral white  matter, most characteristic of chronic microvascular ischemic disease, mild to moderate in nature. No evidence for acute or subacute ischemia. Gray-white matter differentiation maintained. No areas of chronic cortical infarction. No visible acute or chronic intracranial blood products. No mass lesion, midline shift or mass effect. No hydrocephalus or extra-axial fluid collection. Vascular: Major intracranial vascular flow voids are maintained. Skull and upper cervical spine: Craniocervical junction grossly within normal limits. Bone marrow signal intensity grossly normal. No scalp soft tissue abnormality. Sinuses/Orbits: Prior ocular lens replacement on the right. Globes orbital soft tissues demonstrate no acute finding. Mild mucosal thickening about the ethmoidal air cells. No mastoid effusion. Other: None. IMPRESSION: 1. No acute intracranial abnormality. 2. Age-related cerebral atrophy with mild to moderate chronic microvascular ischemic disease. Electronically Signed   By: Jeannine Boga M.D.   On: 08/14/2022 02:42   CT HEAD WO CONTRAST (5MM)  Result Date: 08/13/2022 CLINICAL DATA:  Mental status change, unknown cause EXAM: CT HEAD WITHOUT CONTRAST TECHNIQUE: Contiguous axial images were obtained from the base of the skull through the vertex without intravenous contrast. RADIATION DOSE REDUCTION: This exam was performed according to the departmental dose-optimization program which includes automated exposure control, adjustment of the mA and/or kV according to patient size and/or use of iterative reconstruction technique. COMPARISON:  CT head 07/15/2022. FINDINGS: Brain: No evidence of acute infarction, hemorrhage, hydrocephalus, extra-axial collection or mass lesion/mass effect. Vascular: No hyperdense vessel. Skull: No acute fracture. Sinuses/Orbits: Clear sinuses.  No acute orbital findings. Other: No mastoid effusions. IMPRESSION: No evidence of acute intracranial abnormality. Electronically Signed    By: Margaretha Sheffield M.D.   On: 08/13/2022 17:49   CT CHEST ABDOMEN PELVIS W CONTRAST  Result Date: 08/13/2022 CLINICAL DATA:  Sepsis fever, lactic acidosis EXAM: CT CHEST, ABDOMEN, AND PELVIS WITH CONTRAST TECHNIQUE: Multidetector CT imaging of the chest, abdomen and pelvis was performed following the standard protocol during bolus administration of intravenous contrast. RADIATION DOSE REDUCTION: This exam was performed according to the departmental dose-optimization program which includes automated exposure control, adjustment of the mA and/or kV according to patient size and/or use of iterative reconstruction technique. CONTRAST:  16mL OMNIPAQUE IOHEXOL 350 MG/ML SOLN COMPARISON:  None Available. FINDINGS: CT CHEST FINDINGS Cardiovascular: Calcific atherosclerosis of the aorta and coronary arteries. Normal heart size. No pericardial effusion. Mediastinum/Nodes: Moderate size hiatal hernia. Lungs/Pleura: Left lower lobe consolidation. No pleural effusions or pneumothorax. Musculoskeletal: Healing subacute left posterior eleventh rib fracture. CT ABDOMEN PELVIS FINDINGS Hepatobiliary: No focal liver abnormality is seen. Status post cholecystectomy. No biliary dilatation. Pancreas: Unremarkable. No pancreatic ductal dilatation or surrounding inflammatory changes. Spleen: Normal in size without focal abnormality. Adrenals/Urinary Tract: Adrenal glands are unremarkable. Kidneys are normal, without renal calculi, focal lesion, or hydronephrosis. Bladder is unremarkable. Small amount of ill-defined fluid adjacent to the right kidney, which appears similar. Stomach/Bowel: No evidence of bowel obstruction. Stool within the colon. No evidence of bowel wall thickening or inflammatory change. Vascular/Lymphatic: Aortic atherosclerosis. No enlarged abdominal or pelvic lymph nodes. Reproductive: Status post hysterectomy. No adnexal masses. Other: Stable chronic ovoid fluid collection along the anterior abdominal wall  above the umbilicus. This is associated with a ventral mesh hernia repair. Similar adjacent calcified structure. Musculoskeletal: Degenerative changes, greatest in the lower lumbar spine. IMPRESSION: 1. Left lower lobe consolidation, suspicious for pneumonia and/or aspiration. 2. Healing subacute left posterior eleventh  rib fracture. 3. Stable chronic fluid collection along the anterior mesh from prior ventral hernia repair. Moderately sized hiatal hernia. 4.  Aortic Atherosclerosis (ICD10-I70.0). Electronically Signed   By: Feliberto Harts M.D.   On: 08/13/2022 17:38   DG Chest Portable 1 View  Result Date: 08/13/2022 CLINICAL DATA:  Central line placement EXAM: PORTABLE CHEST 1 VIEW COMPARISON:  08/13/2022 at 2:01 p.m. FINDINGS: Right internal jugular central venous catheter present with tip at the brachiocephalic confluence with the SVC. No pneumothorax. Atherosclerotic calcification of the aortic arch. Mild enlargement of the cardiopericardial silhouette noted with retrocardiac opacity compatible with moderate-sized hiatal hernia. IMPRESSION: 1. Right internal jugular central venous catheter tip is at the brachiocephalic confluence with the SVC. No pneumothorax. 2. Mild enlargement of the cardiopericardial silhouette. 3. Moderate-sized hiatal hernia. Electronically Signed   By: Gaylyn Rong M.D.   On: 08/13/2022 16:10   DG Chest Port 1 View  Result Date: 08/13/2022 CLINICAL DATA:  Altered mental status.  Recent COVID pneumonia EXAM: PORTABLE CHEST 1 VIEW COMPARISON:  Chest radiograph 05/16/2022 FINDINGS: Stable cardiac and mediastinal contours. Rounded retrocardiac density. No pleural effusion or pneumothorax. Thoracic spine degenerative changes. IMPRESSION: There is a rounded retrocardiac density which is nonspecific and may represent the patient's hiatal hernia. Possibility of underlying pulmonary consolidation in the setting of infection not entirely excluded. Electronically Signed   By: Annia Belt M.D.   On: 08/13/2022 14:20     Medical Consultants:   None.   Subjective:    [name] no complaints this morning.  Objective:    Vitals:   08/14/22 2108 08/15/22 0104 08/15/22 0521 08/15/22 0809  BP: 133/68 (!) 150/83 109/69 121/67  Pulse: (!) 102 (!) 106 (!) 106 (!) 113  Resp: 19 20 20  (!) 23  Temp: 98.2 F (36.8 C) 98 F (36.7 C) 98 F (36.7 C) 98.5 F (36.9 C)  TempSrc: Axillary Axillary Axillary Oral  SpO2: 98% 100% 97% 98%  Weight:       SpO2: 98 % O2 Flow Rate (L/min): 2 L/min   Intake/Output Summary (Last 24 hours) at 08/15/2022 1022 Last data filed at 08/15/2022 0810 Gross per 24 hour  Intake 1157.55 ml  Output 600 ml  Net 557.55 ml    Filed Weights   08/13/22 2201  Weight: 86.2 kg    Exam: General exam: In no acute distress. Respiratory system: Good air movement and clear to auscultation. Cardiovascular system: S1 & S2 heard, RRR. No JVD. Gastrointestinal system: Abdomen is nondistended, soft and nontender.  Extremities: No pedal edema. Skin: No rashes, lesions or ulcers Data Reviewed:    Labs: Basic Metabolic Panel: Recent Labs  Lab 08/13/22 1417 08/13/22 1440 08/13/22 1814 08/13/22 2004 08/14/22 0454 08/15/22 0800  NA 144 144 144  --  142 145  K 7.9* 3.8 3.7  --  3.2* 3.5  CL  --  116* 115*  --  113* 118*  CO2  --  19* 20*  --  20* 20*  GLUCOSE  --  85 94  --  107* 81  BUN  --  10 11  --  14 10  CREATININE  --  1.18* 1.19*  --  1.07* 0.94  CALCIUM  --  7.9* 7.6*  --  7.8* 8.2*  MG  --   --   --  1.9 1.9 1.9  PHOS  --   --   --  3.6 2.9 2.7    GFR Estimated Creatinine Clearance: 58.7 mL/min (  by C-G formula based on SCr of 0.94 mg/dL). Liver Function Tests: Recent Labs  Lab 08/13/22 1440 08/13/22 1814 08/14/22 0454 08/15/22 0800  AST 19 18 19 16   ALT 14 13 14 14   ALKPHOS 98 93 99 109  BILITOT 0.9 1.2 0.8 0.8  PROT 4.9* 4.6* 5.5* 5.3*  ALBUMIN 1.7* 1.6* 2.2* 2.0*    Recent Labs  Lab 08/13/22 2004   LIPASE 46    No results for input(s): "AMMONIA" in the last 168 hours. Coagulation profile Recent Labs  Lab 08/13/22 1440  INR 1.4*    COVID-19 Labs  Recent Labs    08/13/22 2004 08/14/22 0454 08/15/22 0800  DDIMER  --  1.87*  --   FERRITIN 315* 345* 347*  LDH 216*  --   --   CRP 10.9* 14.5* 12.7*     Lab Results  Component Value Date   SARSCOV2NAA POSITIVE (A) 08/13/2022   SARSCOV2NAA NEGATIVE 07/15/2022   SARSCOV2NAA NEGATIVE 05/17/2022   SARSCOV2NAA NEGATIVE 10/11/2019    CBC: Recent Labs  Lab 08/13/22 1417 08/13/22 1440 08/14/22 0454 08/15/22 0800  WBC  --  24.6* 23.2* 15.9*  NEUTROABS  --  21.6* 19.8* 13.1*  HGB 10.2* 8.5* 7.5* 7.7*  HCT 30.0* 24.7* 21.6* 23.5*  MCV  --  92.9 92.7 89.4  PLT  --  334 300 346    Cardiac Enzymes: Recent Labs  Lab 08/13/22 1814  CKTOTAL 29*    BNP (last 3 results) No results for input(s): "PROBNP" in the last 8760 hours. CBG: Recent Labs  Lab 08/14/22 2103 08/15/22 0758  GLUCAP 94 92   D-Dimer: Recent Labs    08/14/22 0454  DDIMER 1.87*    Hgb A1c: No results for input(s): "HGBA1C" in the last 72 hours. Lipid Profile: No results for input(s): "CHOL", "HDL", "LDLCALC", "TRIG", "CHOLHDL", "LDLDIRECT" in the last 72 hours. Thyroid function studies: Recent Labs    08/13/22 2004  TSH 1.109    Anemia work up: Recent Labs    08/13/22 2004 08/14/22 0454 08/15/22 0800  VITAMINB12 313  --   --   FOLATE 14.5  --   --   FERRITIN 315* 345* 347*  TIBC NOT CALCULATED  --   --   IRON 11*  --   --   RETICCTPCT 2.4  --   --     Sepsis Labs: Recent Labs  Lab 08/13/22 1440 08/13/22 1814 08/13/22 2004 08/14/22 0120 08/14/22 0453 08/14/22 0454 08/15/22 0800  PROCALCITON  --  2.50 2.85  --   --  3.36 2.46  WBC 24.6*  --   --   --   --  23.2* 15.9*  LATICACIDVEN 4.9*  --  1.0 0.9 1.4  --   --     Microbiology Recent Results (from the past 240 hour(s))  Resp panel by RT-PCR (RSV, Flu A&B,  Covid) Anterior Nasal Swab     Status: Abnormal   Collection Time: 08/13/22  1:44 PM   Specimen: Anterior Nasal Swab  Result Value Ref Range Status   SARS Coronavirus 2 by RT PCR POSITIVE (A) NEGATIVE Final    Comment: (NOTE) SARS-CoV-2 target nucleic acids are DETECTED.  The SARS-CoV-2 RNA is generally detectable in upper respiratory specimens during the acute phase of infection. Positive results are indicative of the presence of the identified virus, but do not rule out bacterial infection or co-infection with other pathogens not detected by the test. Clinical correlation with patient history and other diagnostic information  is necessary to determine patient infection status. The expected result is Negative.  Fact Sheet for Patients: BloggerCourse.com  Fact Sheet for Healthcare Providers: SeriousBroker.it  This test is not yet approved or cleared by the [place] FDA and  has been authorized for detection and/or diagnosis of SARS-CoV-2 by FDA under an Emergency Use Authorization (EUA).  This EUA will remain in effect (meaning this test can be used) for the duration of  the COVID-19 declaration under Section 564(b)(1) of the A ct, 21 U.S.C. section 360bbb-3(b)(1), unless the authorization is terminated or revoked sooner.     Influenza A by PCR NEGATIVE NEGATIVE Final   Influenza B by PCR NEGATIVE NEGATIVE Final    Comment: (NOTE) The Xpert Xpress SARS-CoV-2/FLU/RSV plus assay is intended as an aid in the diagnosis of influenza from Nasopharyngeal swab specimens and should not be used as a sole basis for treatment. Nasal washings and aspirates are unacceptable for Xpert Xpress SARS-CoV-2/FLU/RSV testing.  Fact Sheet for Patients: BloggerCourse.com  Fact Sheet for Healthcare Providers: SeriousBroker.it  This test is not yet approved or cleared by the [place] FDA  and has been authorized for detection and/or diagnosis of SARS-CoV-2 by FDA under an Emergency Use Authorization (EUA). This EUA will remain in effect (meaning this test can be used) for the duration of the COVID-19 declaration under Section 564(b)(1) of the Act, 21 U.S.C. section 360bbb-3(b)(1), unless the authorization is terminated or revoked.     Resp Syncytial Virus by PCR NEGATIVE NEGATIVE Final    Comment: (NOTE) Fact Sheet for Patients: BloggerCourse.com  Fact Sheet for Healthcare Providers: SeriousBroker.it  This test is not yet approved or cleared by the [place] FDA and has been authorized for detection and/or diagnosis of SARS-CoV-2 by FDA under an Emergency Use Authorization (EUA). This EUA will remain in effect (meaning this test can be used) for the duration of the COVID-19 declaration under Section 564(b)(1) of the Act, 21 U.S.C. section 360bbb-3(b)(1), unless the authorization is terminated or revoked.  Performed at [facility], [address], [place], [place] [address]   Blood Culture (routine x 2)     Status: None (Preliminary result)   Collection Time: 08/13/22  2:40 PM   Specimen: BLOOD  Result Value Ref Range Status   Specimen Description BLOOD LEFT ANTECUBITAL  Final   Special Requests   Final    BOTTLES DRAWN AEROBIC AND ANAEROBIC Blood Culture adequate volume   Culture   Final    NO GROWTH < 24 HOURS Performed at [facility], [address], [place], [place] [address]    Report Status PENDING  Incomplete  Blood Culture (routine x 2)     Status: None (Preliminary result)   Collection Time: 08/13/22  4:43 PM   Specimen: BLOOD  Result Value Ref Range Status   Specimen Description BLOOD CENTRAL LINE  Final   Special Requests   Final    BOTTLES DRAWN AEROBIC AND ANAEROBIC Blood Culture adequate volume   Culture   Final    NO GROWTH < 24 HOURS Performed at [facility],  [address], [place], [place] [address]    Report Status PENDING  Incomplete  MRSA Next Gen by PCR, Nasal     Status: Abnormal   Collection Time: 08/14/22 12:29 PM   Specimen: Nasal Mucosa; Nasal Swab  Result Value Ref Range Status   MRSA by PCR Next Gen DETECTED (A) NOT DETECTED Final    Comment: RESULT  CALLED TO, READ BACK BY AND VERIFIED WITH: RN Y. IMAI [id number] @1516  FH` (NOTE) The GeneXpert MRSA Assay (FDA approved for NASAL specimens only), is one component of a comprehensive MRSA colonization surveillance program. It is not intended to diagnose MRSA infection nor to guide or monitor treatment for MRSA infections. Test performance is not FDA approved in patients less than 50 years old. Performed at [facility], [place] [address] [place], [place] [address]      Medications:    amLODipine  5 mg Oral Daily   carvedilol  12.5 mg Oral BID WC   Chlorhexidine Gluconate Cloth  6 each Topical Daily   fesoterodine  4 mg Oral Daily   Gerhardt's butt cream  1 Application Topical TID   latanoprost  1 drop Both Eyes QHS   mometasone-formoterol  2 puff Inhalation BID   nicotine  21 mg Transdermal Daily   Continuous Infusions:  azithromycin 500 mg (08/14/22 1818)   cefTRIAXone (ROCEPHIN)  IV 1 g (08/15/22 0902)   vancomycin 750 mg (08/14/22 1627)      LOS: 2 days   Charlynne Cousins  [facility]  08/15/2022, 10:22 AM

## 2022-08-15 NOTE — Progress Notes (Signed)
Initial Nutrition Assessment  DOCUMENTATION CODES:   Non-severe (moderate) malnutrition in context of chronic illness  INTERVENTION:  Continue 1:1 assistance at meals to help increase oral intake.  Provide Boost Breeze po TID, each supplement provides 250 kcal and 9 grams of protein.  Provide PROSource Plus 30 mL po BID, each supplement provides 100 kcal and 15 grams of protein.  Provide multivitamin with minerals daily.  Recommend measuring updated weight as suspect admission wt may have been reported and not truly measured.  Vitamin supplement plan: -Provide vitamin D2 50000 units once weekly x 2 more weeks and then recheck level -Provide vitamin C 500 mg daily x 2 more weeks and then recheck level -Provide vitamin E 400 units daily x 2 weeks and then recheck level  Attempted to order vitamin A 10000 units daily x 2 weeks, but flagged for fish allergy so did not order at this time. Per discussion with pharmacy, plan is to wait until mental status improves and can further investigate fish allergy with pt to see if she may be able to receive this supplement.  NUTRITION DIAGNOSIS:   Moderate Malnutrition related to chronic illness (pancreatitis, chronic abdominal pain) as evidenced by moderate fat depletion, moderate muscle depletion.  GOAL:   Patient will meet greater than or equal to 90% of their needs  MONITOR:   PO intake, Supplement acceptance, Labs, Weight trends, I & O's  REASON FOR ASSESSMENT:   Consult (Assess nutritional status)    ASSESSMENT:   72 year old female with PMHx of HTN, bipolar disorder, chronic abdominal pain, pancreatitis, esophagitis, hx laparoscopic cholecystectomy 05/22/22 who is admitted with severe sepsis due to PNA, acute respiratory failure, [id number], acute metabolic encephalopathy, AKI.  1/10: diet advanced to dysphagia 1 with thin liquids per SLP recommendations  Per review of chart pt admitted from [facility].  Met with pt at  bedside. She was being fed lunch at time of RD assessment. Pt confused and unable to provide full history. She does endorse that her appetite is not good. She also was able to report she does not like Ensure and prefers Boost Breeze supplements instead. Spoke with patient's son over the phone at [phone] to obtain nutrition history. He reports patient has had decreased appetite and intake for several months now. He endorses she has not been eating well at meals, though unsure of exact meal completion. He reports she has been receiving Ensure at [facility], but unsure how much she was drinking. Son denies any food allergies or intolerances, but per chart noted pt documented to have fish allergy. Son reports pt has not had nausea or emesis. She does have chronic abdominal pain. He also endorses she has had difficulty chewing/swallowing.    Pt and son both endorsed recent weight loss. Pt unable to provide wt history of UBW due to confusion. Son reports he thought UBW was 170 lbs, but recent weights in chart have all been higher than this. Per review of chart pt was 103 kg on 10/11/19. Suspect 103 kg on 05/09/22 was not accurate as pt was then measured to be 84.8 kg on 05/22/22. Wts fluctuated 81.6-82 kg in 11/23-12/23. Suspect admission wt of 86.2 kg was stated and not true measured wt. Recommend measuring weight.  Medications reviewed and include: nicotine patch, potassium chloride 20 mEq BID, azithromycin, ceftriaxone, 1/2 NS at 50 mL/hr  Supplementation PTA (started ~07/23/22 per chart): vitamin C 664 mg daily, folic acid 1 mg daily, MVI daily, vitamin D2  50000 units once weekly on Tuesdays Did not see any vitamin A or vitamin E supplementation ordered and son reports he does not think she has received that supplementation yet.  Labs reviewed: CBG 92-94, Chloride 118, CO2 20  Vitamin/Mineral Profile: Thiamine B1: 125 WNL 07/11/22 Vitamin B12: 1104 H 07/09/22, 313 WNL 08/13/22 Folate B9: 5.9 L  07/09/22, 14.5 WNL 08/13/22 Vitamin A: 11.6 L 07/11/22 Vitamin C:  <0.1 L 07/11/22 Vitamin D: 28.54 L 07/11/22 Vitamin E: 8.6 L 07/11/22 Zinc: 57 WNL 07/11/22 CRP: 8 H 07/11/22, 10.9 H 08/13/22  I/O: 600 mL (0.3 mL/kg/hr) previous 24 hours  UOP: +684.5 mL since admission  Discussed with MD via secure chat. Plan is to continue vitamin D and vitamin C supplementation and also supplementing vitamins A and E. Additional folate supplementation no longer indicated as level has normalized with supplementation, but pt will receive some in multivitamin daily.  NUTRITION - FOCUSED PHYSICAL EXAM:  Flowsheet Row Most Recent Value  Orbital Region Moderate depletion  Upper Arm Region Moderate depletion  Thoracic and Lumbar Region Moderate depletion  Buccal Region Moderate depletion  Temple Region Severe depletion  Clavicle Bone Region Moderate depletion  Clavicle and Acromion Bone Region Moderate depletion  Scapular Bone Region Moderate depletion  Dorsal Hand Moderate depletion  Patellar Region Moderate depletion  Anterior Thigh Region Moderate depletion  Posterior Calf Region Severe depletion  Edema (RD Assessment) None  Hair Reviewed  Eyes Reviewed  Mouth Reviewed  Skin Reviewed  Nails Reviewed      Diet Order:   Diet Order             DIET - DYS 1 Room service appropriate? No; Fluid consistency: Thin  Diet effective now                  EDUCATION NEEDS:   Not appropriate for education at this time  Skin:  Skin Assessment: Skin Integrity Issues: Skin Integrity Issues:: Other (Comment) Other: MASD to buttocks and coccyx  Last BM:  1/11 - large type 7  Height:   Ht Readings from Last 1 Encounters:  07/16/22 5\' 5"  (1.651 m)   Weight:   Wt Readings from Last 1 Encounters:  08/13/22 86.2 kg   Ideal Body Weight:  56.8 kg  BMI:  Body mass index is 31.62 kg/m.  Estimated Nutritional Needs:   Kcal:  2150-2350  Protein:  100-125 grams  Fluid:  >/= 2 L/day  Loanne Drilling, MS, RD, LDN, CNSC Pager number available on Amion

## 2022-08-15 NOTE — Progress Notes (Signed)
Daily Progress Note   Patient Name: Stephanie Benton       Date: 08/15/2022 DOB: 1950/11/24  Age: 72 y.o. MRN#: [id number] Attending Physician: Charlynne Cousins, MD Primary Care Physician: [name], MD Admit Date: 08/13/2022 Length of Stay: 2 days  Reason for Consultation/Follow-up: Establishing goals of care  HPI/Patient Profile:  hypertension, chronic abdominal pain pancreatitis and esophagitis, recently discharged from the hospital from sepsis last month due to community-acquired pneumonia.  She presented as febrile, hypotensive, as well as hypoxic requiring a nonrebreather.  She was admitted with severe sepsis with acute organ dysfunction due to CAP, acute respiratory failure with hypoxia, COVID-19 infection, AKI, acute metabolic encephalopathy, and others.  PMT was consulted for [place] conversations.  Subjective:   Subjective: Chart Reviewed. Updates received. Patient Assessed. Created space and opportunity for patient  and family to explore thoughts and feelings regarding current medical situation.  Today's Discussion: Today I saw the patient at the bedside.  She is again oriented x 1, quite confused.  She does have some mild low back pain but denies nausea, vomiting, other pain.  Some dyspnea, but her oxygen was off so I replaced it to help with her complaints.  At this point she does not appear to be decisional.  I called and spoke with the patient's son [name] and had an extensive conversation with him.  He states at baseline his mom lives at home alone.  Recently she cannot care for herself.  He understands that now she is immobile, and unlikely to recover.  She cannot even make full sentences.  We discussed that prior to her state of readmissions she was independent and able to do things on her own.  Last year she had some GI problems and then kidney surgery.  He feels that after kidney surgery she began to decline.  He does except, and admits that she has had an onward  progressive decline.  He sent her to rehab hoping that she would be able to get some strength back.  However, he is realistic that this is not likely to happen.  We discussed her previous independent life and how she likes to do things on her own.  We discussed that her current quality of life, bedbound and confused, would not be acceptable to her.  He is on board with 1 more attempt to go to SNF/rehab at discharge hoping for a miracle and some improvement but admits "I know what is going to happen."  His overarching goal is, no matter if she continues to decline or improves, that he does not want her to suffer and wants her to be comfortable.  We discussed potential options for if she does not improve at SNF/rehab.  Caring for the patient at home is not a possibility as she only had 1 son and not a significant amount of family in the area.  He notes that he has spoken with the SNF that she was at about possible long-term care as she has Medicaid.  They recommended trying SNF/rehab and attempting transition to long-term care if it does not go well.  We discussed that if she continues to decline and she is not having a quality of life that would be acceptable to her that we can consider hospice care. I described hospice as a service for patients who have a life expectancy of 6 months or less. The goal of hospice is the preservation of dignity and quality at the end phases of life. Under hospice care,  the focus changes from curative to symptom relief.  He does not seem ready to make a decision about hospice today.  I explained that there is no needed time for a decision on hospice.  This is something that can be arranged even after she is out of the hospital, if things do not go well.  I did recommend a consult to outpatient palliative care for ongoing GOC discussions as her clinical status evolves.  He is agreeable to this.  I provided contact information for our team should he have any questions or  concerns.  I provided emotional and general support through therapeutic listening, empathy, sharing of stories, and other techniques. I answered all questions and addressed all concerns to the best of my ability.  Review of Systems  Gastrointestinal:  Negative for nausea and vomiting.  Musculoskeletal:  Positive for back pain (Mild).    Objective:   Vital Signs:  BP (!) 141/90 (BP Location: Right Arm)   Pulse (!) 114   Temp 99.8 F (37.7 C) (Oral)   Resp (!) 24   Wt 86.2 kg   SpO2 100%   BMI 31.62 kg/m   Physical Exam: Physical Exam Vitals and nursing note reviewed.  Constitutional:      General: She is not in acute distress.    Appearance: She is ill-appearing.  HENT:     Head: Normocephalic and atraumatic.  Pulmonary:     Effort: Pulmonary effort is normal. No respiratory distress.     Breath sounds: No wheezing or rhonchi.  Abdominal:     General: Abdomen is flat. There is no distension.     Palpations: Abdomen is soft.     Tenderness: There is no abdominal tenderness.  Skin:    General: Skin is warm and dry.  Neurological:     Mental Status: She is alert. She is disoriented and confused.  Psychiatric:        Mood and Affect: Mood normal.        Behavior: Behavior normal.     Palliative Assessment/Data: 30%    Existing Vynca/ACP Documentation: None  Assessment & Plan:   Impression: Present on Admission:  CAP (community acquired pneumonia)  Hypertension  AKI (acute kidney injury) (HCC)  Anemia  Bipolar disorder, in full remission, most recent episode mixed (HCC)  Acute metabolic encephalopathy  Severe sepsis (HCC)  Tobacco abuse  72 year old female admitted with confusion found to have sepsis due to community-acquired pneumonia, positive COVID, metabolic encephalopathy likely due to infection and sepsis as well as AKI (creatinine 1.18 on admission with a baseline of 0.7). She is a DNR per ER provider/admitting provider discussions. Attempting to  contact patient's son for further GOC conversations. Overall long-term prognosis poor given frequent hospital admissions (5 inpatient admissions in 6 months).   SUMMARY OF RECOMMENDATIONS   Continue DNR Continue to treat the treatable Anticipate discharge to SNF/rehab [place] referral for outpatient palliative care PMT will continue to follow peripherally for any new needs or significant changes  Symptom Management:  Per primary team PMT is available to assist as needed  Code Status: DNR  Prognosis: Unable to determine  Discharge Planning: Skilled Nursing Facility for rehab with Palliative care service follow-up  Discussed with: Patient's son, medical team, nursing team  Thank you for allowing [name] to participate in the care of Stephanie Benton PMT will continue to support holistically.  Time Total: 60 min  Visit consisted of counseling and education dealing with the complex and emotionally intense issues  of symptom management and palliative care in the setting of serious and potentially life-threatening illness. Greater than 50%  of this time was spent counseling and coordinating care related to the above assessment and plan.  Walden Field, NP Palliative Medicine Team  Team Phone # [phone] (Nights/Weekends)  04/03/2021, 8:17 AM

## 2022-08-16 DIAGNOSIS — A419 Sepsis, unspecified organism: Secondary | ICD-10-CM | POA: Diagnosis not present

## 2022-08-16 DIAGNOSIS — J189 Pneumonia, unspecified organism: Secondary | ICD-10-CM | POA: Diagnosis not present

## 2022-08-16 DIAGNOSIS — U071 COVID-19: Secondary | ICD-10-CM | POA: Diagnosis not present

## 2022-08-16 DIAGNOSIS — N179 Acute kidney failure, unspecified: Secondary | ICD-10-CM | POA: Diagnosis not present

## 2022-08-16 LAB — CBC WITH DIFFERENTIAL/PLATELET
Abs Immature Granulocytes: 0.16 10*3/uL — ABNORMAL HIGH (ref 0.00–0.07)
Basophils Absolute: 0.1 10*3/uL (ref 0.0–0.1)
Basophils Relative: 0 %
Eosinophils Absolute: 0.2 10*3/uL (ref 0.0–0.5)
Eosinophils Relative: 2 %
HCT: 22.4 % — ABNORMAL LOW (ref 36.0–46.0)
Hemoglobin: 7.4 g/dL — ABNORMAL LOW (ref 12.0–15.0)
Immature Granulocytes: 1 %
Lymphocytes Relative: 12 %
Lymphs Abs: 1.5 10*3/uL (ref 0.7–4.0)
MCH: 28.9 pg (ref 26.0–34.0)
MCHC: 33 g/dL (ref 30.0–36.0)
MCV: 87.5 fL (ref 80.0–100.0)
Monocytes Absolute: 1 10*3/uL (ref 0.1–1.0)
Monocytes Relative: 9 %
Neutro Abs: 9 10*3/uL — ABNORMAL HIGH (ref 1.7–7.7)
Neutrophils Relative %: 76 %
Platelets: 308 10*3/uL (ref 150–400)
RBC: 2.56 MIL/uL — ABNORMAL LOW (ref 3.87–5.11)
RDW: 18.1 % — ABNORMAL HIGH (ref 11.5–15.5)
WBC: 11.8 10*3/uL — ABNORMAL HIGH (ref 4.0–10.5)
nRBC: 0.2 % (ref 0.0–0.2)

## 2022-08-16 LAB — COMPREHENSIVE METABOLIC PANEL
ALT: 13 U/L (ref 0–44)
AST: 17 U/L (ref 15–41)
Albumin: 1.9 g/dL — ABNORMAL LOW (ref 3.5–5.0)
Alkaline Phosphatase: 102 U/L (ref 38–126)
Anion gap: 7 (ref 5–15)
BUN: <5 mg/dL — ABNORMAL LOW (ref 8–23)
CO2: 20 mmol/L — ABNORMAL LOW (ref 22–32)
Calcium: 8 mg/dL — ABNORMAL LOW (ref 8.9–10.3)
Chloride: 117 mmol/L — ABNORMAL HIGH (ref 98–111)
Creatinine, Ser: 0.86 mg/dL (ref 0.44–1.00)
GFR, Estimated: >60 mL/min (ref >60)
Glucose, Bld: 83 mg/dL (ref 70–99)
Potassium: 3.4 mmol/L — ABNORMAL LOW (ref 3.5–5.1)
Sodium: 144 mmol/L (ref 135–145)
Total Bilirubin: 0.9 mg/dL (ref 0.3–1.2)
Total Protein: 5 g/dL — ABNORMAL LOW (ref 6.5–8.1)

## 2022-08-16 LAB — FERRITIN: Ferritin: 350 ng/mL — ABNORMAL HIGH (ref 11–307)

## 2022-08-16 LAB — MAGNESIUM: Magnesium: 1.8 mg/dL (ref 1.7–2.4)

## 2022-08-16 LAB — PHOSPHORUS: Phosphorus: 2.5 mg/dL (ref 2.5–4.6)

## 2022-08-16 LAB — C-REACTIVE PROTEIN: CRP: 7.5 mg/dL — ABNORMAL HIGH (ref <1.0)

## 2022-08-16 MED ORDER — AZITHROMYCIN 250 MG PO TABS: 500.0000 mg | ORAL_TABLET | Freq: Every day | ORAL | Status: DC

## 2022-08-16 MED ORDER — CEFDINIR 300 MG PO CAPS
300.0000 mg | ORAL_CAPSULE | Freq: Two times a day (BID) | ORAL | Status: AC
  Administered 2022-08-16 – 2022-08-17 (×3): 300 mg via ORAL
  Filled 2022-08-16 (×3): qty 1

## 2022-08-16 MED ORDER — POTASSIUM CHLORIDE 20 MEQ PO PACK
20.0000 meq | PACK | Freq: Two times a day (BID) | ORAL | Status: AC
  Administered 2022-08-16 (×2): 20 meq via ORAL
  Filled 2022-08-16 (×2): qty 1

## 2022-08-16 MED ORDER — MUPIROCIN 2 % EX OINT
1.0000 | TOPICAL_OINTMENT | Freq: Two times a day (BID) | CUTANEOUS | Status: DC
  Administered 2022-08-16 – 2022-08-18 (×5): 1 via NASAL
  Filled 2022-08-16: qty 22

## 2022-08-16 MED ORDER — METOPROLOL TARTRATE 25 MG PO TABS: 25.0000 mg | ORAL_TABLET | Freq: Two times a day (BID) | ORAL | Status: DC

## 2022-08-16 NOTE — Progress Notes (Signed)
Patient refusing to eat dinner, ate 10% breakfast and 5% lunch despite encouragement.

## 2022-08-16 NOTE — Care Management Important Message (Signed)
Important Message  Patient Details  Name: Stephanie Benton MRN: [id number] Date of Birth: Oct 31, 1950   Medicare Important Message Given:  Yes     Shelda Altes 08/16/2022, 9:25 AM

## 2022-08-16 NOTE — Progress Notes (Signed)
   Brief PMT Progress Note:  Medical records reviewed.  Noted that palliative medicine consult was discontinued today.  Goals of care are clear at this time.  Patient's son is agreeable to outpatient palliative care for continued discussions and potential transition to hospice.  Please reconsult should further palliative needs arise during patient's admission.   Dorthy Cooler, [facility] Palliative Medicine Team Team phone # [phone]  NO CHARGE

## 2022-08-16 NOTE — TOC Progression Note (Addendum)
Transition of Care [facility]) - Progression Note    Patient Details  Name: Stephanie Benton MRN: [id number] Date of Birth: Dec 20, 1950  Transition of Care [facility]) CM/SW [place], [place] Phone Number: 08/16/2022, 1:03 PM  Clinical Narrative:      Update- CSW started insurance authorization for patient. [place] ID# [id number] . CSW spoke with Kia with [facility] who confirmed patient can return back when medically ready. Insurance authorization is pending. CSW received consult for palliative referral for palliative services to follow patient at snf. CSW spoke with patients son [name] who gave CSW permission to make referral to [place] for palliative services to follow patient at SNF. CSW made referral to [place] with [facility]. CSW will continue to follow and assist with patients dc planning needs.   Patient has SNF bed at [facility] [place]. CSW following to start insurance authorization close to patient being medically ready for dc. CSW will continue to follow and assist with patients dc planning needs.  Expected Discharge Plan: [place] Barriers to Discharge: Continued Medical Work up  Expected Discharge Plan and Services In-house Referral: Clinical Social Work     Living arrangements for the past 2 months:  (Patient came from [facility] short term)                                       Social Determinants of Health (SDOH) Interventions Elias-Fela Solis: No Food Insecurity (07/09/2022)  Housing: Low Risk  (07/09/2022)  Recent Concern: Housing - Medium Risk (06/21/2022)  Transportation Needs: No Transportation Needs (07/09/2022)  Recent Concern: Transportation Needs - Unmet Transportation Needs (06/21/2022)  Utilities: Not At Risk (07/09/2022)  Tobacco Use: High Risk (07/15/2022)    Readmission Risk Interventions    07/22/2022   11:58 AM 07/18/2022    8:43 AM 07/10/2022   10:49 AM  Readmission Risk Prevention Plan  Transportation Screening   Complete Complete  Medication Review ([place])  Complete Complete  PCP or Specialist appointment within 3-5 days of discharge  Complete Complete  HRI or Home Care Consult  Complete Complete  SW Recovery Care/Counseling Consult  Complete Complete  Palliative Care Screening  Not Applicable Not [place] Complete Not Applicable

## 2022-08-16 NOTE — Progress Notes (Signed)
[facility] PROGRESS NOTE    Progress Note  Stephanie Benton  JKD:[id number] DOB: Aug 19, 1950 DOA: 08/13/2022 PCP: Sharmon Revere, MD     Brief Narrative:   Stephanie Benton is an 72 y.o. female past medical history of hypertension, chronic abdominal pain pancreatitis and esophagitis, recently discharged from the hospital from sepsis last month due to community-acquired pneumonia.  Was febrile hypotensive as well as hypoxic requiring a nonrebreather   Assessment/Plan:   Severe sepsis with acute organ dysfunction due to CAP (community acquired pneumonia) Transition antibiotics to oral Omnicef and azithromycin. Blood cultures remain negative to date, she has remained afebrile. Try to wean to room air. PT evaluated the patient will need skilled nursing facility.  Acute respiratory failure with hypoxia: Still requiring 1 L of oxygen to keep sats greater than 90%. Try to wean to room air check saturations with ambulation.  COVID-19 infection: Recently diagnosed, currently asymptomatic from.  Acute metabolic encephalopathy: Likely due to infectious etiology. Started on IV fluids and antibiotics. Now resolved.  Acute kidney injury: Likely due to sepsis, with a baseline creatinine of less than than 1. Creatinine improved with fluid resuscitation. KVO IV fluids.  Normocytic anemia: Hemoglobin is around 8.5-7.5. Baseline is around 8. Continue to monitor.  Bipolar disorder: In full remission continue Current home medication.  Essential hypertension: Restart Coreg hold ARB and torsemide. Started on low-dose Coreg continue to monitor blood pressure.  Tobacco abuse: Noted.  Hypokalemia: Replete orally  DVT prophylaxis: SCD Family Communication:none Status is: Inpatient Remains inpatient appropriate because: Acute respiratory failure/severe sepsis due to pneumonia.    Code Status:     Code Status Orders  (From admission, onward)           Start      Ordered   08/13/22 1932  Do not attempt resuscitation (DNR)  Continuous       Question Answer Comment  If patient has no pulse and is not breathing Do Not Attempt Resuscitation   If patient has a pulse and/or is breathing: Medical Treatment Goals LIMITED ADDITIONAL INTERVENTIONS: Use medication/IV fluids and cardiac monitoring as indicated; Do not use intubation or mechanical ventilation (DNI), also provide comfort medications.  Transfer to Progressive/Stepdown as indicated, avoid Intensive Care.   Consent: Discussion documented in EHR or advanced directives reviewed      08/13/22 1932           Code Status History     Date Active Date Inactive Code Status Order ID Comments User Context   07/20/2022 1729 07/22/2022 1925 DNR [id number]  Lorin Glass, MD Inpatient   07/16/2022 0445 07/20/2022 1729 Full Code [id number]  John Giovanni, MD ED   07/08/2022 0840 07/13/2022 1806 Full Code [id number]  Bobette Mo, MD ED   06/21/2022 1753 06/25/2022 1934 Full Code [id number]  Bobette Mo, MD Inpatient   06/21/2022 1555 06/21/2022 1753 Full Code [id number]  Bobette Mo, MD ED   05/17/2022 0147 05/23/2022 1715 Full Code [id number]  Therisa Doyne, MD ED   02/17/2018 0446 02/18/2018 1413 Full Code [id number]  Briscoe Deutscher, MD Inpatient         IV Access:   Peripheral IV   Procedures and diagnostic studies:   No results found.   Medical Consultants:   None.   Subjective:    Stephanie Benton Relates she feels muchBetter this morning.  Objective:    Vitals:   08/15/22 2020 08/16/22 0752 08/16/22 1130 08/16/22 1216  BP: (!) 142/90 138/68 Marland Kitchen)  149/87   Pulse:  (!) 105 (!) 106 (!) 103  Resp:  (!) 23 20   Temp: 98.5 F (36.9 C) 98.7 F (37.1 C) 99.7 F (37.6 C)   TempSrc: Axillary Oral Oral   SpO2:  98% 97% 96%  Weight:       SpO2: 96 % O2 Flow Rate (L/min): 1 L/min FiO2 (%): 28 %   Intake/Output Summary (Last 24 hours) at 08/16/2022  1300 Last data filed at 08/16/2022 1118 Gross per 24 hour  Intake 1138.76 ml  Output --  Net 1138.76 ml    Filed Weights   08/13/22 2201  Weight: 86.2 kg    Exam: General exam: In no acute distress. Respiratory system: Good air movement and clear to auscultation. Cardiovascular system: S1 & S2 heard, RRR. No JVD. Gastrointestinal system: Abdomen is nondistended, soft and nontender.  Extremities: No pedal edema. Skin: No rashes, lesions or ulcers Psychiatry: Judgement and insight appear normal. Mood & affect appropriate. Data Reviewed:    Labs: Basic Metabolic Panel: Recent Labs  Lab 08/13/22 1440 08/13/22 1814 08/13/22 2004 08/14/22 0454 08/15/22 0800 08/16/22 0540  NA 144 144  --  142 145 144  K 3.8 3.7  --  3.2* 3.5 3.4*  CL 116* 115*  --  113* 118* 117*  CO2 19* 20*  --  20* 20* 20*  GLUCOSE 85 94  --  107* 81 83  BUN 10 11  --  14 10 <5*  CREATININE 1.18* 1.19*  --  1.07* 0.94 0.86  CALCIUM 7.9* 7.6*  --  7.8* 8.2* 8.0*  MG  --   --  1.9 1.9 1.9 1.8  PHOS  --   --  3.6 2.9 2.7 2.5    GFR Estimated Creatinine Clearance: 64.1 mL/min (by C-G formula based on SCr of 0.86 mg/dL). Liver Function Tests: Recent Labs  Lab 08/13/22 1440 08/13/22 1814 08/14/22 0454 08/15/22 0800 08/16/22 0540  AST 19 18 19 16 17   ALT 14 13 14 14 13   ALKPHOS 98 93 99 109 102  BILITOT 0.9 1.2 0.8 0.8 0.9  PROT 4.9* 4.6* 5.5* 5.3* 5.0*  ALBUMIN 1.7* 1.6* 2.2* 2.0* 1.9*    Recent Labs  Lab 08/13/22 2004  LIPASE 46    No results for input(s): "AMMONIA" in the last 168 hours. Coagulation profile Recent Labs  Lab 08/13/22 1440  INR 1.4*    COVID-19 Labs  Recent Labs    08/13/22 2004 08/14/22 0454 08/15/22 0800 08/16/22 0540  DDIMER  --  1.87*  --   --   FERRITIN 315* 345* 347* 350*  LDH 216*  --   --   --   CRP 10.9* 14.5* 12.7* 7.5*     Lab Results  Component Value Date   SARSCOV2NAA POSITIVE (A) 08/13/2022   SARSCOV2NAA NEGATIVE 07/15/2022    [place] NEGATIVE 05/17/2022   [place] NEGATIVE 10/11/2019    CBC: Recent Labs  Lab 08/13/22 1417 08/13/22 1440 08/14/22 0454 08/15/22 0800 08/16/22 0540  WBC  --  24.6* 23.2* 15.9* 11.8*  NEUTROABS  --  21.6* 19.8* 13.1* 9.0*  HGB 10.2* 8.5* 7.5* 7.7* 7.4*  HCT 30.0* 24.7* 21.6* 23.5* 22.4*  MCV  --  92.9 92.7 89.4 87.5  PLT  --  334 300 346 308    Cardiac Enzymes: Recent Labs  Lab 08/13/22 1814  CKTOTAL 29*    BNP (last 3 results) No results for input(s): "PROBNP" in the last 8760 hours. CBG: Recent Labs  Lab 08/14/22 12-01-2101 08/15/22 0758 08/15/22 1118 08/15/22 1743 08/15/22 Dec 01, 2136  GLUCAP 94 92 93 95 99    D-Dimer: Recent Labs    08/14/22 0454  DDIMER 1.87*    Hgb A1c: No results for input(s): "HGBA1C" in the last 72 hours. Lipid Profile: No results for input(s): "CHOL", "HDL", "LDLCALC", "TRIG", "CHOLHDL", "LDLDIRECT" in the last 72 hours. Thyroid function studies: Recent Labs    08/13/22 Dec 02, 2002  TSH 1.109    Anemia work up: Recent Labs    08/13/22 December 02, 2002 08/14/22 0454 08/15/22 0800 08/16/22 0540  VITAMINB12 313  --   --   --   FOLATE 14.5  --   --   --   FERRITIN 315*   < > 347* 350*  TIBC NOT CALCULATED  --   --   --   IRON 11*  --   --   --   RETICCTPCT 2.4  --   --   --    < > = values in this interval not displayed.    Sepsis Labs: Recent Labs  Lab 08/13/22 1440 08/13/22 1814 08/13/22 12/02/02 08/14/22 0120 08/14/22 0453 08/14/22 0454 08/15/22 0800 08/16/22 0540  PROCALCITON  --  2.50 2.85  --   --  3.36 2.46  --   WBC 24.6*  --   --   --   --  23.2* 15.9* 11.8*  LATICACIDVEN 4.9*  --  1.0 0.9 1.4  --   --   --     Microbiology Recent Results (from the past 240 hour(s))  Resp panel by RT-PCR (RSV, Flu A&B, Covid) Anterior Nasal Swab     Status: Abnormal   Collection Time: 08/13/22  1:44 PM   Specimen: Anterior Nasal Swab  Result Value Ref Range Status   SARS Coronavirus 2 by RT PCR POSITIVE (A) NEGATIVE Final     Comment: (NOTE) SARS-CoV-2 target nucleic acids are DETECTED.  The SARS-CoV-2 RNA is generally detectable in upper respiratory specimens during the acute phase of infection. Positive results are indicative of the presence of the identified virus, but do not rule out bacterial infection or co-infection with other pathogens not detected by the test. Clinical correlation with patient history and other diagnostic information is necessary to determine patient infection status. The expected result is Negative.  Fact Sheet for Patients: EntrepreneurPulse.com.au  Fact Sheet for Healthcare Providers: IncredibleEmployment.be  This test is not yet approved or cleared by the [place] FDA and  has been authorized for detection and/or diagnosis of SARS-CoV-2 by FDA under an Emergency Use Authorization (EUA).  This EUA will remain in effect (meaning this test can be used) for the duration of  the COVID-19 declaration under Section 564(b)(1) of the A ct, 21 U.S.C. section 360bbb-3(b)(1), unless the authorization is terminated or revoked sooner.     Influenza A by PCR NEGATIVE NEGATIVE Final   Influenza B by PCR NEGATIVE NEGATIVE Final    Comment: (NOTE) The Xpert Xpress SARS-CoV-2/FLU/RSV plus assay is intended as an aid in the diagnosis of influenza from Nasopharyngeal swab specimens and should not be used as a sole basis for treatment. Nasal washings and aspirates are unacceptable for Xpert Xpress SARS-CoV-2/FLU/RSV testing.  Fact Sheet for Patients: EntrepreneurPulse.com.au  Fact Sheet for Healthcare Providers: IncredibleEmployment.be  This test is not yet approved or cleared by the [place] FDA and has been authorized for detection and/or diagnosis of SARS-CoV-2 by FDA under an Emergency Use Authorization (EUA). This EUA will remain in effect (meaning  this test can be used) for the duration of  the COVID-19 declaration under Section 564(b)(1) of the Act, 21 U.S.C. section 360bbb-3(b)(1), unless the authorization is terminated or revoked.     Resp Syncytial Virus by PCR NEGATIVE NEGATIVE Final    Comment: (NOTE) Fact Sheet for Patients: BloggerCourse.com  Fact Sheet for Healthcare Providers: SeriousBroker.it  This test is not yet approved or cleared by the [place] FDA and has been authorized for detection and/or diagnosis of SARS-CoV-2 by FDA under an Emergency Use Authorization (EUA). This EUA will remain in effect (meaning this test can be used) for the duration of the COVID-19 declaration under Section 564(b)(1) of the Act, 21 U.S.C. section 360bbb-3(b)(1), unless the authorization is terminated or revoked.  Performed at [facility], [address], [place], [place] [address]   Blood Culture (routine x 2)     Status: None (Preliminary result)   Collection Time: 08/13/22  2:40 PM   Specimen: BLOOD  Result Value Ref Range Status   Specimen Description BLOOD LEFT ANTECUBITAL  Final   Special Requests   Final    BOTTLES DRAWN AEROBIC AND ANAEROBIC Blood Culture adequate volume   Culture   Final    NO GROWTH 2 DAYS Performed at [facility], [address], [place], [place] [address]    Report Status PENDING  Incomplete  Blood Culture (routine x 2)     Status: None (Preliminary result)   Collection Time: 08/13/22  4:43 PM   Specimen: BLOOD  Result Value Ref Range Status   Specimen Description BLOOD CENTRAL LINE  Final   Special Requests   Final    BOTTLES DRAWN AEROBIC AND ANAEROBIC Blood Culture adequate volume   Culture   Final    NO GROWTH 2 DAYS Performed at [facility], [address], [place], [place] [address]    Report Status PENDING  Incomplete  MRSA Next Gen by PCR, Nasal     Status: Abnormal   Collection Time: 08/14/22 12:29 PM   Specimen: Nasal Mucosa; Nasal Swab  Result  Value Ref Range Status   MRSA by PCR Next Gen DETECTED (A) NOT DETECTED Final    Comment: RESULT CALLED TO, READ BACK BY AND VERIFIED WITH: RN Y. IMAI [id number] @1516  FH` (NOTE) The GeneXpert MRSA Assay (FDA approved for NASAL specimens only), is one component of a comprehensive MRSA colonization surveillance program. It is not intended to diagnose MRSA infection nor to guide or monitor treatment for MRSA infections. Test performance is not FDA approved in patients less than 42 years old. Performed at [facility], [address] [place], [place]      Medications:    (feeding supplement) PROSource Plus  30 mL Oral BID BM   amLODipine  5 mg Oral Daily   ascorbic acid  500 mg Oral Daily   carvedilol  12.5 mg Oral BID WC   Chlorhexidine Gluconate Cloth  6 each Topical Daily   feeding supplement  1 Container Oral TID WC   fesoterodine  4 mg Oral Daily   Gerhardt's butt cream  1 Application Topical TID   latanoprost  1 drop Both Eyes QHS   mometasone-formoterol  2 puff Inhalation BID   multivitamin  1 tablet Oral Daily   multivitamin with minerals  1 tablet Oral Daily   mupirocin ointment  1 Application Nasal BID   nicotine  21 mg Transdermal Daily   potassium chloride  20  mEq Oral BID   [START ON 08/20/2022] Vitamin D (Ergocalciferol)  50,000 Units Oral Q7 days   Vitamin E  400 Units Oral Q1200   Continuous Infusions:  azithromycin     cefTRIAXone (ROCEPHIN)  IV Stopped (08/16/22 0930)      LOS: 3 days   Marinda Elk  [facility]  08/16/2022, 1:00 PM

## 2022-08-16 NOTE — Progress Notes (Signed)
Manufacturing engineer [facility]) Hospital Liaison Note  Notified by [facility] manager of patient/family request for [facility] palliative services at [facility] after discharge.   [facility] hospital liaison will follow patient for discharge disposition.   Please call with any hospice or outpatient palliative care related questions.   Thank you for the opportunity to participate in this patient's care.   Buck Mam [facility] Liaison [phone]

## 2022-08-17 DIAGNOSIS — U071 COVID-19: Secondary | ICD-10-CM | POA: Diagnosis not present

## 2022-08-17 DIAGNOSIS — A419 Sepsis, unspecified organism: Secondary | ICD-10-CM | POA: Diagnosis not present

## 2022-08-17 DIAGNOSIS — J189 Pneumonia, unspecified organism: Secondary | ICD-10-CM | POA: Diagnosis not present

## 2022-08-17 LAB — BASIC METABOLIC PANEL
Anion gap: 7 (ref 5–15)
BUN: 5 mg/dL — ABNORMAL LOW (ref 8–23)
CO2: 20 mmol/L — ABNORMAL LOW (ref 22–32)
Calcium: 8.3 mg/dL — ABNORMAL LOW (ref 8.9–10.3)
Chloride: 116 mmol/L — ABNORMAL HIGH (ref 98–111)
Creatinine, Ser: 0.76 mg/dL (ref 0.44–1.00)
GFR, Estimated: >60 mL/min (ref >60)
Glucose, Bld: 87 mg/dL (ref 70–99)
Potassium: 3.6 mmol/L (ref 3.5–5.1)
Sodium: 143 mmol/L (ref 135–145)

## 2022-08-17 MED ORDER — CARVEDILOL 25 MG PO TABS: 25.0000 mg | ORAL_TABLET | Freq: Two times a day (BID) | ORAL | Status: AC

## 2022-08-17 MED ORDER — LOSARTAN POTASSIUM 100 MG PO TABS: 50.0000 mg | ORAL_TABLET | Freq: Every day | ORAL | Status: AC

## 2022-08-17 MED ORDER — CARVEDILOL 25 MG PO TABS
25.0000 mg | ORAL_TABLET | Freq: Two times a day (BID) | ORAL | Status: DC
  Administered 2022-08-17: 25 mg via ORAL
  Filled 2022-08-17: qty 1

## 2022-08-17 MED ORDER — CEFDINIR 300 MG PO CAPS: 300.0000 mg | ORAL_CAPSULE | Freq: Two times a day (BID) | ORAL | 0 refills | Status: DC

## 2022-08-17 MED ORDER — TORSEMIDE 20 MG PO TABS: 10.0000 mg | ORAL_TABLET | Freq: Every day | ORAL | Status: DC | PRN

## 2022-08-17 NOTE — Discharge Summary (Signed)
Physician Discharge Summary  Stephanie Benton SXJ:[id number] DOB: [birth year] DOA: 08/13/2022  PCP: Sharmon Revere, MD  Admit date: 08/13/2022 Discharge date: 08/18/2022  Admitted From: SNF Disposition:  SNF  Recommendations for Outpatient Follow-up:  Follow up with PCP in 1-2 weeks Please obtain BMP/CBC in one week   Home Health:No Equipment/Devices:None  Discharge Condition:Stable CODE STATUS:Full Diet recommendation: Heart Healthy   Brief/Interim Summary:  72 y.o. female past medical history of hypertension, chronic abdominal pain pancreatitis and esophagitis, recently discharged from the hospital from sepsis last month due to community-acquired pneumonia.  Was febrile hypotensive as well as hypoxic requiring a nonrebreather   Discharge Diagnoses:  Principal Problem:   CAP (community acquired pneumonia) Active Problems:   Hypertension   AKI (acute kidney injury) (HCC)   Anemia   Bipolar disorder, in full remission, most recent episode mixed (HCC)   Severe sepsis (HCC)   Acute metabolic encephalopathy   Tobacco abuse  Severe sepsis with acute organ dysfunction due to community-acquired pneumonia: She was started empirically on IV antibiotics and oxygen. She defervesced culture data remain negative till date. She will finish her antibiotic treatment as an outpatient. Physical therapy evaluated the patient she will need to go to skilled nursing facility.  Acute hypoxic respiratory failure with hypoxia: Due to community-acquired pneumonia she was initially started on 5 L of oxygen . She is now being weaned to room air.  COVID-19 infection: Recently diagnosed currently asymptomatic.  Acute metabolic encephalopathy: Likely due to infectious etiology now back to baseline.  Acute kidney injury: Likely due to sepsis resolved with fluids rotation and her creatinine returned to baseline ARB were held on admission she will resume these as an outpatient.  Normocytic  anemia: Hemoglobin has remained relatively stable she will need a CBC in 1 week.  Bipolar disorder: In full remission no changes made to her medication.  Essential hypertension: Antihypertensive medications were held initially as his blood pressure was borderline she will resume on Coreg and a higher dose torsemide at the regular dose. She will go home on an ARB at a lower dose.  Tobacco abuse: Noted.  Hypokalemia: Replete orally now resolved.  Discharge Instructions  Discharge Instructions     Diet - low sodium heart healthy   Complete by: As directed    Increase activity slowly   Complete by: As directed    No wound care   Complete by: As directed       Allergies as of 08/17/2022       Reactions   Fish Allergy Anaphylaxis   Morphine And Related Anaphylaxis, Swelling   Belsomra [suvorexant] Other (See Comments)   Nightmares    Codeine Nausea And Vomiting   Duragesic-100 [fentanyl] Hives   Motrin [ibuprofen] Nausea Only, Other (See Comments)   History of stomach ulcers   Nsaids Other (See Comments)   History of stomach ulcers   Penicillins Hives   Did it involve swelling of the face/tongue/throat, SOB, or low BP? Yes Did it involve sudden or severe rash/hives, skin peeling, or any reaction on the inside of your mouth or nose? Yes Did you need to seek medical attention at a hospital or doctor's office? Yes When did it last happen? More than 10 years ago If all above answers are "NO", may proceed with cephalosporin use.   Sulfa Antibiotics Swelling   Not listed on MAR   Tolectin [tolmetin] Other (See Comments)   History of stomach ulcers Not listed on [facility]  Medication List     STOP taking these medications    folic acid 1 MG tablet Commonly known as: FOLVITE       TAKE these medications    ascorbic acid 500 MG tablet Commonly known as: VITAMIN C Take 1 tablet (500 mg total) by mouth daily. What changed:  when to take this additional  instructions   budesonide-formoterol 160-4.5 MCG/ACT inhaler Commonly known as: SYMBICORT Inhale 2 puffs into the lungs every 12 (twelve) hours as needed (wheezing).   carvedilol 25 MG tablet Commonly known as: COREG Take 1 tablet (25 mg total) by mouth 2 (two) times daily with a meal. What changed:  medication strength how much to take   cefdinir 300 MG capsule Commonly known as: OMNICEF Take 1 capsule (300 mg total) by mouth every 12 (twelve) hours for 2 days.   COLLAGEN EX Apply 1 application  topically See admin instructions. Cleanse right knee, pat dry, apply collagen and cover with silicone dressing every Monday, Wednesday, Friday during day shift.   dorzolamide 2 % ophthalmic solution Commonly known as: TRUSOPT Place 1 drop into both eyes 2 (two) times daily. (1000, 1800)   DULoxetine HCl 40 MG Csdr Take 40 mg by mouth in the morning. (1000)   Ensure Original Liqd Take 1 each by mouth 2 (two) times daily. (1000, 1800)   ferrous sulfate 325 (65 FE) MG tablet Take 325 mg by mouth 2 (two) times daily. (1000, 1800)   latanoprost 0.005 % ophthalmic solution Commonly known as: XALATAN Place 1 drop into both eyes at bedtime. (2100)   losartan 100 MG tablet Commonly known as: COZAAR Take 0.5 tablets (50 mg total) by mouth daily. (1000) What changed:  how much to take when to take this   multivitamin with minerals tablet Take 1 tablet by mouth in the morning. (1000)   ondansetron 4 MG tablet Commonly known as: ZOFRAN Take 4 mg by mouth every 8 (eight) hours as needed for nausea or vomiting.   pantoprazole 40 MG tablet Commonly known as: PROTONIX Take 40 mg by mouth 2 (two) times daily. (0600, 1600)   polyethylene glycol powder 17 GM/SCOOP powder Commonly known as: GLYCOLAX/MIRALAX Take 1 cap full (17 g) with water by mouth daily. What changed:  when to take this additional instructions   potassium chloride 10 MEQ tablet Commonly known as: KLOR-CON Take 10  mEq by mouth 2 (two) times daily. (0900, 1800)   tolterodine 2 MG tablet Commonly known as: DETROL Take 2 mg by mouth in the morning. (1000)   torsemide 10 MG tablet Commonly known as: DEMADEX Take 10 mg by mouth daily as needed (edema).   traMADol 50 MG tablet Commonly known as: ULTRAM Take 50 mg by mouth every 8 (eight) hours as needed for severe pain or moderate pain.   Vitamin D (Ergocalciferol) 1.25 MG (50000 UNIT) Caps capsule Commonly known as: DRISDOL Take 1 capsule (50,000 Units total) by mouth every 7 (seven) days. What changed:  when to take this additional instructions        Allergies  Allergen Reactions   Fish Allergy Anaphylaxis   Morphine And Related Anaphylaxis and Swelling   Belsomra [Suvorexant] Other (See Comments)    Nightmares     Codeine Nausea And Vomiting   Duragesic-100 [Fentanyl] Hives   Motrin [Ibuprofen] Nausea Only and Other (See Comments)    History of stomach ulcers   Nsaids Other (See Comments)    History of stomach ulcers   Penicillins  Hives    Did it involve swelling of the face/tongue/throat, SOB, or low BP? Yes Did it involve sudden or severe rash/hives, skin peeling, or any reaction on the inside of your mouth or nose? Yes Did you need to seek medical attention at a hospital or doctor's office? Yes When did it last happen? More than 10 years ago If all above answers are "NO", may proceed with cephalosporin use.    Sulfa Antibiotics Swelling    Not listed on MAR   Tolectin [Tolmetin] Other (See Comments)    History of stomach ulcers Not listed on MAR    Consultations: None   Procedures/Studies: MR BRAIN WO CONTRAST  Result Date: 08/14/2022 CLINICAL DATA:  Initial evaluation for delirium. EXAM: MRI HEAD WITHOUT CONTRAST TECHNIQUE: Multiplanar, multiecho pulse sequences of the brain and surrounding structures were obtained without intravenous contrast. COMPARISON:  CT from 08/13/2022. FINDINGS: Brain: Examination moderately  degraded by motion artifact. Generalized age-related cerebral atrophy. Patchy and confluent T2/FLAIR hyperintensity involving the supratentorial cerebral white matter, most characteristic of chronic microvascular ischemic disease, mild to moderate in nature. No evidence for acute or subacute ischemia. Gray-white matter differentiation maintained. No areas of chronic cortical infarction. No visible acute or chronic intracranial blood products. No mass lesion, midline shift or mass effect. No hydrocephalus or extra-axial fluid collection. Vascular: Major intracranial vascular flow voids are maintained. Skull and upper cervical spine: Craniocervical junction grossly within normal limits. Bone marrow signal intensity grossly normal. No scalp soft tissue abnormality. Sinuses/Orbits: Prior ocular lens replacement on the right. Globes orbital soft tissues demonstrate no acute finding. Mild mucosal thickening about the ethmoidal air cells. No mastoid effusion. Other: None. IMPRESSION: 1. No acute intracranial abnormality. 2. Age-related cerebral atrophy with mild to moderate chronic microvascular ischemic disease. Electronically Signed   By: Jeannine Boga M.D.   On: 08/14/2022 02:42   CT HEAD WO CONTRAST (5MM)  Result Date: 08/13/2022 CLINICAL DATA:  Mental status change, unknown cause EXAM: CT HEAD WITHOUT CONTRAST TECHNIQUE: Contiguous axial images were obtained from the base of the skull through the vertex without intravenous contrast. RADIATION DOSE REDUCTION: This exam was performed according to the departmental dose-optimization program which includes automated exposure control, adjustment of the mA and/or kV according to patient size and/or use of iterative reconstruction technique. COMPARISON:  CT head 07/15/2022. FINDINGS: Brain: No evidence of acute infarction, hemorrhage, hydrocephalus, extra-axial collection or mass lesion/mass effect. Vascular: No hyperdense vessel. Skull: No acute fracture.  Sinuses/Orbits: Clear sinuses.  No acute orbital findings. Other: No mastoid effusions. IMPRESSION: No evidence of acute intracranial abnormality. Electronically Signed   By: Margaretha Sheffield M.D.   On: 08/13/2022 17:49   CT CHEST ABDOMEN PELVIS W CONTRAST  Result Date: 08/13/2022 CLINICAL DATA:  Sepsis fever, lactic acidosis EXAM: CT CHEST, ABDOMEN, AND PELVIS WITH CONTRAST TECHNIQUE: Multidetector CT imaging of the chest, abdomen and pelvis was performed following the standard protocol during bolus administration of intravenous contrast. RADIATION DOSE REDUCTION: This exam was performed according to the departmental dose-optimization program which includes automated exposure control, adjustment of the mA and/or kV according to patient size and/or use of iterative reconstruction technique. CONTRAST:  65mL OMNIPAQUE IOHEXOL 350 MG/ML SOLN COMPARISON:  None Available. FINDINGS: CT CHEST FINDINGS Cardiovascular: Calcific atherosclerosis of the aorta and coronary arteries. Normal heart size. No pericardial effusion. Mediastinum/Nodes: Moderate size hiatal hernia. Lungs/Pleura: Left lower lobe consolidation. No pleural effusions or pneumothorax. Musculoskeletal: Healing subacute left posterior eleventh rib fracture. CT ABDOMEN PELVIS FINDINGS Hepatobiliary: No focal  liver abnormality is seen. Status post cholecystectomy. No biliary dilatation. Pancreas: Unremarkable. No pancreatic ductal dilatation or surrounding inflammatory changes. Spleen: Normal in size without focal abnormality. Adrenals/Urinary Tract: Adrenal glands are unremarkable. Kidneys are normal, without renal calculi, focal lesion, or hydronephrosis. Bladder is unremarkable. Small amount of ill-defined fluid adjacent to the right kidney, which appears similar. Stomach/Bowel: No evidence of bowel obstruction. Stool within the colon. No evidence of bowel wall thickening or inflammatory change. Vascular/Lymphatic: Aortic atherosclerosis. No enlarged  abdominal or pelvic lymph nodes. Reproductive: Status post hysterectomy. No adnexal masses. Other: Stable chronic ovoid fluid collection along the anterior abdominal wall above the umbilicus. This is associated with a ventral mesh hernia repair. Similar adjacent calcified structure. Musculoskeletal: Degenerative changes, greatest in the lower lumbar spine. IMPRESSION: 1. Left lower lobe consolidation, suspicious for pneumonia and/or aspiration. 2. Healing subacute left posterior eleventh rib fracture. 3. Stable chronic fluid collection along the anterior mesh from prior ventral hernia repair. Moderately sized hiatal hernia. 4.  Aortic Atherosclerosis (ICD10-I70.0). Electronically Signed   By: Feliberto Harts M.D.   On: 08/13/2022 17:38   DG Chest Portable 1 View  Result Date: 08/13/2022 CLINICAL DATA:  Central line placement EXAM: PORTABLE CHEST 1 VIEW COMPARISON:  08/13/2022 at 2:01 p.m. FINDINGS: Right internal jugular central venous catheter present with tip at the brachiocephalic confluence with the SVC. No pneumothorax. Atherosclerotic calcification of the aortic arch. Mild enlargement of the cardiopericardial silhouette noted with retrocardiac opacity compatible with moderate-sized hiatal hernia. IMPRESSION: 1. Right internal jugular central venous catheter tip is at the brachiocephalic confluence with the SVC. No pneumothorax. 2. Mild enlargement of the cardiopericardial silhouette. 3. Moderate-sized hiatal hernia. Electronically Signed   By: Gaylyn Rong M.D.   On: 08/13/2022 16:10   DG Chest Port 1 View  Result Date: 08/13/2022 CLINICAL DATA:  Altered mental status.  Recent COVID pneumonia EXAM: PORTABLE CHEST 1 VIEW COMPARISON:  Chest radiograph 05/16/2022 FINDINGS: Stable cardiac and mediastinal contours. Rounded retrocardiac density. No pleural effusion or pneumothorax. Thoracic spine degenerative changes. IMPRESSION: There is a rounded retrocardiac density which is nonspecific and may  represent the patient's hiatal hernia. Possibility of underlying pulmonary consolidation in the setting of infection not entirely excluded. Electronically Signed   By: Annia Belt M.D.   On: 08/13/2022 14:20   (Echo, Carotid, EGD, Colonoscopy, ERCP)    Subjective: No complaints  Discharge Exam: Vitals:   08/17/22 0600 08/17/22 0857  BP: (!) 151/89 (!) 154/79  Pulse: (!) 107 (!) 113  Resp: 18 19  Temp: 99 F (37.2 C) 98 F (36.7 C)  SpO2: 100% 98%   Vitals:   08/16/22 2100 08/17/22 0110 08/17/22 0600 08/17/22 0857  BP:  (!) 153/68 (!) 151/89 (!) 154/79  Pulse: (!) 107 (!) 107 (!) 107 (!) 113  Resp:  19 18 19   Temp:  99 F (37.2 C) 99 F (37.2 C) 98 F (36.7 C)  TempSrc:  Axillary Axillary Axillary  SpO2:  100% 100% 98%  Weight:   73.9 kg     General: Pt is alert, awake, not in acute distress Cardiovascular: RRR, S1/S2 +, no rubs, no gallops Respiratory: CTA bilaterally, no wheezing, no rhonchi Abdominal: Soft, NT, ND, bowel sounds + Extremities: no edema, no cyanosis    The results of significant diagnostics from this hospitalization (including imaging, microbiology, ancillary and laboratory) are listed below for reference.     Microbiology: Recent Results (from the past 240 hour(s))  Resp panel by RT-PCR (RSV, Flu A&B,  Covid) Anterior Nasal Swab     Status: Abnormal   Collection Time: 08/13/22  1:44 PM   Specimen: Anterior Nasal Swab  Result Value Ref Range Status   SARS Coronavirus 2 by RT PCR POSITIVE (A) NEGATIVE Final    Comment: (NOTE) SARS-CoV-2 target nucleic acids are DETECTED.  The SARS-CoV-2 RNA is generally detectable in upper respiratory specimens during the acute phase of infection. Positive results are indicative of the presence of the identified virus, but do not rule out bacterial infection or co-infection with other pathogens not detected by the test. Clinical correlation with patient history and other diagnostic information is necessary to  determine patient infection status. The expected result is Negative.  Fact Sheet for Patients: BloggerCourse.com  Fact Sheet for Healthcare Providers: SeriousBroker.it  This test is not yet approved or cleared by the [place] FDA and  has been authorized for detection and/or diagnosis of SARS-CoV-2 by FDA under an Emergency Use Authorization (EUA).  This EUA will remain in effect (meaning this test can be used) for the duration of  the COVID-19 declaration under Section 564(b)(1) of the A ct, 21 U.S.C. section 360bbb-3(b)(1), unless the authorization is terminated or revoked sooner.     Influenza A by PCR NEGATIVE NEGATIVE Final   Influenza B by PCR NEGATIVE NEGATIVE Final    Comment: (NOTE) The Xpert Xpress SARS-CoV-2/FLU/RSV plus assay is intended as an aid in the diagnosis of influenza from Nasopharyngeal swab specimens and should not be used as a sole basis for treatment. Nasal washings and aspirates are unacceptable for Xpert Xpress SARS-CoV-2/FLU/RSV testing.  Fact Sheet for Patients: BloggerCourse.com  Fact Sheet for Healthcare Providers: SeriousBroker.it  This test is not yet approved or cleared by the [place] FDA and has been authorized for detection and/or diagnosis of SARS-CoV-2 by FDA under an Emergency Use Authorization (EUA). This EUA will remain in effect (meaning this test can be used) for the duration of the COVID-19 declaration under Section 564(b)(1) of the Act, 21 U.S.C. section 360bbb-3(b)(1), unless the authorization is terminated or revoked.     Resp Syncytial Virus by PCR NEGATIVE NEGATIVE Final    Comment: (NOTE) Fact Sheet for Patients: BloggerCourse.com  Fact Sheet for Healthcare Providers: SeriousBroker.it  This test is not yet approved or cleared by the [place] FDA and has  been authorized for detection and/or diagnosis of SARS-CoV-2 by FDA under an Emergency Use Authorization (EUA). This EUA will remain in effect (meaning this test can be used) for the duration of the COVID-19 declaration under Section 564(b)(1) of the Act, 21 U.S.C. section 360bbb-3(b)(1), unless the authorization is terminated or revoked.  Performed at [facility], [address], [place], [place] [address]   Blood Culture (routine x 2)     Status: None (Preliminary result)   Collection Time: 08/13/22  2:40 PM   Specimen: BLOOD  Result Value Ref Range Status   Specimen Description BLOOD LEFT ANTECUBITAL  Final   Special Requests   Final    BOTTLES DRAWN AEROBIC AND ANAEROBIC Blood Culture adequate volume   Culture   Final    NO GROWTH 4 DAYS Performed at [facility], [address] [place], [place] [address]    Report Status PENDING  Incomplete  Blood Culture (routine x 2)     Status: None (Preliminary result)   Collection Time: 08/13/22  4:43 PM   Specimen: BLOOD  Result Value Ref Range Status   Specimen Description BLOOD CENTRAL LINE  Final   Special Requests   Final    BOTTLES DRAWN AEROBIC AND ANAEROBIC Blood Culture adequate volume   Culture   Final    NO GROWTH 4 DAYS Performed at [facility], [address] [place], [place] [address]    Report Status PENDING  Incomplete  MRSA Next Gen by PCR, Nasal     Status: Abnormal   Collection Time: 08/14/22 12:29 PM   Specimen: Nasal Mucosa; Nasal Swab  Result Value Ref Range Status   MRSA by PCR Next Gen DETECTED (A) NOT DETECTED Final    Comment: RESULT CALLED TO, READ BACK BY AND VERIFIED WITH: RN Y. IMAI [id number] @1516  FH` (NOTE) The GeneXpert MRSA Assay (FDA approved for NASAL specimens only), is one component of a comprehensive MRSA colonization surveillance program. It is not intended to diagnose MRSA infection nor to guide or monitor treatment for MRSA infections. Test performance is not FDA  approved in patients less than 73 years old. Performed at [facility], [address] [place], [place] [address]      Labs: BNP (last 3 results) Recent Labs    05/08/22 2023  BNP 19.7   Basic Metabolic Panel: Recent Labs  Lab 08/13/22 1814 08/13/22 2004 08/14/22 0454 08/15/22 0800 08/16/22 0540 08/17/22 0141  NA 144  --  142 145 144 143  K 3.7  --  3.2* 3.5 3.4* 3.6  CL 115*  --  113* 118* 117* 116*  CO2 20*  --  20* 20* 20* 20*  GLUCOSE 94  --  107* 81 83 87  BUN 11  --  14 10 <5* 5*  CREATININE 1.19*  --  1.07* 0.94 0.86 0.76  CALCIUM 7.6*  --  7.8* 8.2* 8.0* 8.3*  MG  --  1.9 1.9 1.9 1.8  --   PHOS  --  3.6 2.9 2.7 2.5  --    Liver Function Tests: Recent Labs  Lab 08/13/22 1440 08/13/22 1814 08/14/22 0454 08/15/22 0800 08/16/22 0540  AST 19 18 19 16 17   ALT 14 13 14 14 13   ALKPHOS 98 93 99 109 102  BILITOT 0.9 1.2 0.8 0.8 0.9  PROT 4.9* 4.6* 5.5* 5.3* 5.0*  ALBUMIN 1.7* 1.6* 2.2* 2.0* 1.9*   Recent Labs  Lab 08/13/22 2004  LIPASE 46   No results for input(s): "AMMONIA" in the last 168 hours. CBC: Recent Labs  Lab 08/13/22 1417 08/13/22 1440 08/14/22 0454 08/15/22 0800 08/16/22 0540  WBC  --  24.6* 23.2* 15.9* 11.8*  NEUTROABS  --  21.6* 19.8* 13.1* 9.0*  HGB 10.2* 8.5* 7.5* 7.7* 7.4*  HCT 30.0* 24.7* 21.6* 23.5* 22.4*  MCV  --  92.9 92.7 89.4 87.5  PLT  --  334 300 346 308   Cardiac Enzymes: Recent Labs  Lab 08/13/22 1814  CKTOTAL 29*   BNP: Invalid input(s): "POCBNP" CBG: Recent Labs  Lab 08/14/22 2103 08/15/22 0758 08/15/22 1118 08/15/22 1743 08/15/22 2138  GLUCAP 94 92 93 95 99   D-Dimer No results for input(s): "DDIMER" in the last 72 hours. Hgb A1c No results for input(s): "HGBA1C" in the last 72 hours. Lipid Profile No results for input(s): "CHOL", "HDL", "LDLCALC", "TRIG", "CHOLHDL", "LDLDIRECT" in the last 72 hours. Thyroid function studies No results for input(s): "TSH", "T4TOTAL", "T3FREE", "THYROIDAB"  in the last 72 hours.  Invalid input(s): "FREET3" Anemia work up Recent Labs    08/15/22 0800 08/16/22 0540  FERRITIN 347* 350*   Urinalysis  Component Value Date/Time   COLORURINE YELLOW 08/14/2022 0457   APPEARANCEUR CLEAR 08/14/2022 0457   LABSPEC 1.026 08/14/2022 0457   PHURINE 7.0 08/14/2022 0457   GLUCOSEU NEGATIVE 08/14/2022 0457   HGBUR NEGATIVE 08/14/2022 0457   BILIRUBINUR NEGATIVE 08/14/2022 0457   KETONESUR NEGATIVE 08/14/2022 0457   PROTEINUR NEGATIVE 08/14/2022 0457   UROBILINOGEN 1.0 01/11/2010 1841   NITRITE NEGATIVE 08/14/2022 0457   LEUKOCYTESUR NEGATIVE 08/14/2022 0457   Sepsis Labs Recent Labs  Lab 08/13/22 1440 08/14/22 0454 08/15/22 0800 08/16/22 0540  WBC 24.6* 23.2* 15.9* 11.8*   Microbiology Recent Results (from the past 240 hour(s))  Resp panel by RT-PCR (RSV, Flu A&B, Covid) Anterior Nasal Swab     Status: Abnormal   Collection Time: 08/13/22  1:44 PM   Specimen: Anterior Nasal Swab  Result Value Ref Range Status   SARS Coronavirus 2 by RT PCR POSITIVE (A) NEGATIVE Final    Comment: (NOTE) SARS-CoV-2 target nucleic acids are DETECTED.  The SARS-CoV-2 RNA is generally detectable in upper respiratory specimens during the acute phase of infection. Positive results are indicative of the presence of the identified virus, but do not rule out bacterial infection or co-infection with other pathogens not detected by the test. Clinical correlation with patient history and other diagnostic information is necessary to determine patient infection status. The expected result is Negative.  Fact Sheet for Patients: BloggerCourse.com  Fact Sheet for Healthcare Providers: SeriousBroker.it  This test is not yet approved or cleared by the [place] FDA and  has been authorized for detection and/or diagnosis of SARS-CoV-2 by FDA under an Emergency Use Authorization (EUA).  This EUA will remain  in effect (meaning this test can be used) for the duration of  the COVID-19 declaration under Section 564(b)(1) of the A ct, 21 U.S.C. section 360bbb-3(b)(1), unless the authorization is terminated or revoked sooner.     Influenza A by PCR NEGATIVE NEGATIVE Final   Influenza B by PCR NEGATIVE NEGATIVE Final    Comment: (NOTE) The Xpert Xpress SARS-CoV-2/FLU/RSV plus assay is intended as an aid in the diagnosis of influenza from Nasopharyngeal swab specimens and should not be used as a sole basis for treatment. Nasal washings and aspirates are unacceptable for Xpert Xpress SARS-CoV-2/FLU/RSV testing.  Fact Sheet for Patients: BloggerCourse.com  Fact Sheet for Healthcare Providers: SeriousBroker.it  This test is not yet approved or cleared by the [place] FDA and has been authorized for detection and/or diagnosis of SARS-CoV-2 by FDA under an Emergency Use Authorization (EUA). This EUA will remain in effect (meaning this test can be used) for the duration of the COVID-19 declaration under Section 564(b)(1) of the Act, 21 U.S.C. section 360bbb-3(b)(1), unless the authorization is terminated or revoked.     Resp Syncytial Virus by PCR NEGATIVE NEGATIVE Final    Comment: (NOTE) Fact Sheet for Patients: BloggerCourse.com  Fact Sheet for Healthcare Providers: SeriousBroker.it  This test is not yet approved or cleared by the [place] FDA and has been authorized for detection and/or diagnosis of SARS-CoV-2 by FDA under an Emergency Use Authorization (EUA). This EUA will remain in effect (meaning this test can be used) for the duration of the COVID-19 declaration under Section 564(b)(1) of the Act, 21 U.S.C. section 360bbb-3(b)(1), unless the authorization is terminated or revoked.  Performed at [facility], [address] [place], [place] [address]   Blood  Culture (routine x 2)     Status: None (Preliminary result)   Collection Time:  08/13/22  2:40 PM   Specimen: BLOOD  Result Value Ref Range Status   Specimen Description BLOOD LEFT ANTECUBITAL  Final   Special Requests   Final    BOTTLES DRAWN AEROBIC AND ANAEROBIC Blood Culture adequate volume   Culture   Final    NO GROWTH 4 DAYS Performed at [facility], [place] [address] [place], [place] [address]    Report Status PENDING  Incomplete  Blood Culture (routine x 2)     Status: None (Preliminary result)   Collection Time: 08/13/22  4:43 PM   Specimen: BLOOD  Result Value Ref Range Status   Specimen Description BLOOD CENTRAL LINE  Final   Special Requests   Final    BOTTLES DRAWN AEROBIC AND ANAEROBIC Blood Culture adequate volume   Culture   Final    NO GROWTH 4 DAYS Performed at [facility], [place] [address] [place], [place] [address]    Report Status PENDING  Incomplete  MRSA Next Gen by PCR, Nasal     Status: Abnormal   Collection Time: 08/14/22 12:29 PM   Specimen: Nasal Mucosa; Nasal Swab  Result Value Ref Range Status   MRSA by PCR Next Gen DETECTED (A) NOT DETECTED Final    Comment: RESULT CALLED TO, READ BACK BY AND VERIFIED WITH: RN Y. IMAI [id number] @1516  FH` (NOTE) The GeneXpert MRSA Assay (FDA approved for NASAL specimens only), is one component of a comprehensive MRSA colonization surveillance program. It is not intended to diagnose MRSA infection nor to guide or monitor treatment for MRSA infections. Test performance is not FDA approved in patients less than 45 years old. Performed at [facility], [place] [address], [place], [place] [address]     SIGNED:   Charlynne Cousins, MD  [facility] 08/17/2022, 11:06 AM Pager   If 7PM-7AM, please contact night-coverage [email or web address] Password [email or web address]

## 2022-08-17 NOTE — TOC Progression Note (Signed)
Transition of Care [facility]) - Progression Note    Patient Details  Name: Stephanie Benton MRN: [id number] Date of Birth: July 29, 1951  Transition of Care [facility]) CM/SW [place], [place] Phone Number: 08/17/2022, 11:36 AM  Clinical Narrative:     CSW reached out to [place], was advised that Josem Kaufmann is still pending.    Expected Discharge Plan: [place] Barriers to Discharge: Continued Medical Work up  Expected Discharge Plan and Services In-house Referral: Clinical Social Work     Living arrangements for the past 2 months:  (Patient came from [facility] short term) Expected Discharge Date: 08/17/22                                     Social Determinants of Health ([place]) Interventions [place]: No Food Insecurity (07/09/2022)  Housing: Low Risk  (07/09/2022)  Recent Concern: Housing - Medium Risk (06/21/2022)  Transportation Needs: No Transportation Needs (07/09/2022)  Recent Concern: Transportation Needs - Unmet Transportation Needs (06/21/2022)  Utilities: Not At Risk (07/09/2022)  Tobacco Use: High Risk (07/15/2022)    Readmission Risk Interventions    07/22/2022   11:58 AM 07/18/2022    8:43 AM 07/10/2022   10:49 AM  Readmission Risk Prevention Plan  Transportation Screening  Complete Complete  Medication Review ([place])  Complete Complete  PCP or Specialist appointment within 3-5 days of discharge  Complete Complete  HRI or Home Care Consult  Complete Complete  SW Recovery Care/Counseling Consult  Complete Complete  Palliative Care Screening  Not Applicable Not [place] Complete Not Applicable

## 2022-08-18 ENCOUNTER — Emergency Department (HOSPITAL_COMMUNITY): Payer: 59

## 2022-08-18 ENCOUNTER — Other Ambulatory Visit: Payer: Self-pay

## 2022-08-18 ENCOUNTER — Inpatient Hospital Stay (HOSPITAL_COMMUNITY)
Admission: EM | Admit: 2022-08-18 | Discharge: 2022-09-13 | Disposition: A | Discharge status: Skilled Nursing Facility | Payer: 59 | Source: Home / Self Care | Attending: Internal Medicine | Admitting: Internal Medicine

## 2022-08-18 DIAGNOSIS — R54 Age-related physical debility: Secondary | ICD-10-CM | POA: Diagnosis present

## 2022-08-18 DIAGNOSIS — J9601 Acute respiratory failure with hypoxia: Secondary | ICD-10-CM | POA: Diagnosis present

## 2022-08-18 DIAGNOSIS — T17908A Unspecified foreign body in respiratory tract, part unspecified causing other injury, initial encounter: Secondary | ICD-10-CM

## 2022-08-18 DIAGNOSIS — K3189 Other diseases of stomach and duodenum: Secondary | ICD-10-CM | POA: Diagnosis present

## 2022-08-18 DIAGNOSIS — F419 Anxiety disorder, unspecified: Secondary | ICD-10-CM | POA: Diagnosis present

## 2022-08-18 DIAGNOSIS — K2970 Gastritis, unspecified, without bleeding: Secondary | ICD-10-CM | POA: Diagnosis present

## 2022-08-18 DIAGNOSIS — Z6830 Body mass index (BMI) 30.0-30.9, adult: Secondary | ICD-10-CM

## 2022-08-18 DIAGNOSIS — Z90710 Acquired absence of both cervix and uterus: Secondary | ICD-10-CM

## 2022-08-18 DIAGNOSIS — Z882 Allergy status to sulfonamides status: Secondary | ICD-10-CM

## 2022-08-18 DIAGNOSIS — Z885 Allergy status to narcotic agent status: Secondary | ICD-10-CM

## 2022-08-18 DIAGNOSIS — K861 Other chronic pancreatitis: Secondary | ICD-10-CM | POA: Diagnosis present

## 2022-08-18 DIAGNOSIS — R29898 Other symptoms and signs involving the musculoskeletal system: Secondary | ICD-10-CM

## 2022-08-18 DIAGNOSIS — R739 Hyperglycemia, unspecified: Secondary | ICD-10-CM | POA: Diagnosis not present

## 2022-08-18 DIAGNOSIS — Z515 Encounter for palliative care: Secondary | ICD-10-CM

## 2022-08-18 DIAGNOSIS — G9340 Encephalopathy, unspecified: Secondary | ICD-10-CM

## 2022-08-18 DIAGNOSIS — N179 Acute kidney failure, unspecified: Secondary | ICD-10-CM | POA: Diagnosis present

## 2022-08-18 DIAGNOSIS — G9341 Metabolic encephalopathy: Secondary | ICD-10-CM | POA: Diagnosis present

## 2022-08-18 DIAGNOSIS — Z880 Allergy status to penicillin: Secondary | ICD-10-CM

## 2022-08-18 DIAGNOSIS — Z800 Family history of malignant neoplasm of digestive organs: Secondary | ICD-10-CM

## 2022-08-18 DIAGNOSIS — Z9049 Acquired absence of other specified parts of digestive tract: Secondary | ICD-10-CM

## 2022-08-18 DIAGNOSIS — E7800 Pure hypercholesterolemia, unspecified: Secondary | ICD-10-CM | POA: Diagnosis present

## 2022-08-18 DIAGNOSIS — Y95 Nosocomial condition: Secondary | ICD-10-CM | POA: Diagnosis present

## 2022-08-18 DIAGNOSIS — R5381 Other malaise: Secondary | ICD-10-CM

## 2022-08-18 DIAGNOSIS — E8770 Fluid overload, unspecified: Secondary | ICD-10-CM | POA: Diagnosis not present

## 2022-08-18 DIAGNOSIS — I10 Essential (primary) hypertension: Secondary | ICD-10-CM | POA: Diagnosis present

## 2022-08-18 DIAGNOSIS — L89312 Pressure ulcer of right buttock, stage 2: Secondary | ICD-10-CM | POA: Diagnosis present

## 2022-08-18 DIAGNOSIS — F17210 Nicotine dependence, cigarettes, uncomplicated: Secondary | ICD-10-CM | POA: Diagnosis present

## 2022-08-18 DIAGNOSIS — R7989 Other specified abnormal findings of blood chemistry: Secondary | ICD-10-CM | POA: Diagnosis present

## 2022-08-18 DIAGNOSIS — K921 Melena: Secondary | ICD-10-CM | POA: Diagnosis present

## 2022-08-18 DIAGNOSIS — K2100 Gastro-esophageal reflux disease with esophagitis, without bleeding: Secondary | ICD-10-CM | POA: Diagnosis present

## 2022-08-18 DIAGNOSIS — R1310 Dysphagia, unspecified: Secondary | ICD-10-CM | POA: Diagnosis present

## 2022-08-18 DIAGNOSIS — E876 Hypokalemia: Secondary | ICD-10-CM | POA: Diagnosis present

## 2022-08-18 DIAGNOSIS — E669 Obesity, unspecified: Secondary | ICD-10-CM | POA: Diagnosis present

## 2022-08-18 DIAGNOSIS — R6520 Severe sepsis without septic shock: Secondary | ICD-10-CM | POA: Diagnosis not present

## 2022-08-18 DIAGNOSIS — T17998A Other foreign object in respiratory tract, part unspecified causing other injury, initial encounter: Secondary | ICD-10-CM | POA: Diagnosis present

## 2022-08-18 DIAGNOSIS — Z886 Allergy status to analgesic agent status: Secondary | ICD-10-CM

## 2022-08-18 DIAGNOSIS — L89152 Pressure ulcer of sacral region, stage 2: Secondary | ICD-10-CM | POA: Diagnosis present

## 2022-08-18 DIAGNOSIS — R4589 Other symptoms and signs involving emotional state: Secondary | ICD-10-CM

## 2022-08-18 DIAGNOSIS — R7401 Elevation of levels of liver transaminase levels: Secondary | ICD-10-CM | POA: Diagnosis present

## 2022-08-18 DIAGNOSIS — A419 Sepsis, unspecified organism: Secondary | ICD-10-CM | POA: Diagnosis present

## 2022-08-18 DIAGNOSIS — I471 Supraventricular tachycardia, unspecified: Secondary | ICD-10-CM | POA: Diagnosis not present

## 2022-08-18 DIAGNOSIS — R52 Pain, unspecified: Secondary | ICD-10-CM

## 2022-08-18 DIAGNOSIS — L89322 Pressure ulcer of left buttock, stage 2: Secondary | ICD-10-CM | POA: Diagnosis present

## 2022-08-18 DIAGNOSIS — D62 Acute posthemorrhagic anemia: Secondary | ICD-10-CM | POA: Diagnosis present

## 2022-08-18 DIAGNOSIS — R0689 Other abnormalities of breathing: Secondary | ICD-10-CM

## 2022-08-18 DIAGNOSIS — K449 Diaphragmatic hernia without obstruction or gangrene: Secondary | ICD-10-CM | POA: Diagnosis present

## 2022-08-18 DIAGNOSIS — Z716 Tobacco abuse counseling: Secondary | ICD-10-CM

## 2022-08-18 DIAGNOSIS — Z720 Tobacco use: Secondary | ICD-10-CM | POA: Diagnosis present

## 2022-08-18 DIAGNOSIS — Z66 Do not resuscitate: Secondary | ICD-10-CM | POA: Diagnosis present

## 2022-08-18 DIAGNOSIS — J189 Pneumonia, unspecified organism: Secondary | ICD-10-CM | POA: Diagnosis present

## 2022-08-18 DIAGNOSIS — Z8249 Family history of ischemic heart disease and other diseases of the circulatory system: Secondary | ICD-10-CM

## 2022-08-18 DIAGNOSIS — J440 Chronic obstructive pulmonary disease with acute lower respiratory infection: Secondary | ICD-10-CM | POA: Diagnosis present

## 2022-08-18 DIAGNOSIS — F3178 Bipolar disorder, in full remission, most recent episode mixed: Secondary | ICD-10-CM | POA: Diagnosis present

## 2022-08-18 DIAGNOSIS — Z888 Allergy status to other drugs, medicaments and biological substances status: Secondary | ICD-10-CM

## 2022-08-18 DIAGNOSIS — Z79899 Other long term (current) drug therapy: Secondary | ICD-10-CM

## 2022-08-18 DIAGNOSIS — Z7189 Other specified counseling: Secondary | ICD-10-CM

## 2022-08-18 DIAGNOSIS — U071 COVID-19: Secondary | ICD-10-CM | POA: Diagnosis present

## 2022-08-18 DIAGNOSIS — E440 Moderate protein-calorie malnutrition: Secondary | ICD-10-CM | POA: Diagnosis present

## 2022-08-18 DIAGNOSIS — J449 Chronic obstructive pulmonary disease, unspecified: Secondary | ICD-10-CM | POA: Diagnosis present

## 2022-08-18 DIAGNOSIS — J690 Pneumonitis due to inhalation of food and vomit: Secondary | ICD-10-CM | POA: Diagnosis present

## 2022-08-18 DIAGNOSIS — G8929 Other chronic pain: Secondary | ICD-10-CM | POA: Diagnosis present

## 2022-08-18 DIAGNOSIS — J9811 Atelectasis: Secondary | ICD-10-CM

## 2022-08-18 DIAGNOSIS — R34 Anuria and oliguria: Secondary | ICD-10-CM | POA: Diagnosis not present

## 2022-08-18 DIAGNOSIS — R0600 Dyspnea, unspecified: Secondary | ICD-10-CM

## 2022-08-18 DIAGNOSIS — L8990 Pressure ulcer of unspecified site, unspecified stage: Secondary | ICD-10-CM | POA: Insufficient documentation

## 2022-08-18 DIAGNOSIS — R918 Other nonspecific abnormal finding of lung field: Secondary | ICD-10-CM

## 2022-08-18 LAB — CBG MONITORING, ED: Glucose-Capillary: 84 mg/dL (ref 70–99)

## 2022-08-18 LAB — CULTURE, BLOOD (ROUTINE X 2)
Culture: NO GROWTH
Culture: NO GROWTH
Special Requests: ADEQUATE
Special Requests: ADEQUATE

## 2022-08-18 LAB — COMPREHENSIVE METABOLIC PANEL
ALT: 16 U/L (ref 0–44)
AST: 25 U/L (ref 15–41)
Albumin: 2.2 g/dL — ABNORMAL LOW (ref 3.5–5.0)
Alkaline Phosphatase: 91 U/L (ref 38–126)
Anion gap: 8 (ref 5–15)
BUN: 16 mg/dL (ref 8–23)
CO2: 17 mmol/L — ABNORMAL LOW (ref 22–32)
Calcium: 8 mg/dL — ABNORMAL LOW (ref 8.9–10.3)
Chloride: 115 mmol/L — ABNORMAL HIGH (ref 98–111)
Creatinine, Ser: 0.91 mg/dL (ref 0.44–1.00)
GFR, Estimated: >60 mL/min (ref >60)
Glucose, Bld: 97 mg/dL (ref 70–99)
Potassium: 3 mmol/L — ABNORMAL LOW (ref 3.5–5.1)
Sodium: 140 mmol/L (ref 135–145)
Total Bilirubin: 0.7 mg/dL (ref 0.3–1.2)
Total Protein: 5.8 g/dL — ABNORMAL LOW (ref 6.5–8.1)

## 2022-08-18 LAB — CBC WITH DIFFERENTIAL/PLATELET
Abs Immature Granulocytes: 0.55 10*3/uL — ABNORMAL HIGH (ref 0.00–0.07)
Basophils Absolute: 0.1 10*3/uL (ref 0.0–0.1)
Basophils Relative: 0 %
Eosinophils Absolute: 0.1 10*3/uL (ref 0.0–0.5)
Eosinophils Relative: 0 %
HCT: 24.9 % — ABNORMAL LOW (ref 36.0–46.0)
Hemoglobin: 7.7 g/dL — ABNORMAL LOW (ref 12.0–15.0)
Immature Granulocytes: 3 %
Lymphocytes Relative: 13 %
Lymphs Abs: 2.6 10*3/uL (ref 0.7–4.0)
MCH: 27.6 pg (ref 26.0–34.0)
MCHC: 30.9 g/dL (ref 30.0–36.0)
MCV: 89.2 fL (ref 80.0–100.0)
Monocytes Absolute: 1.3 10*3/uL — ABNORMAL HIGH (ref 0.1–1.0)
Monocytes Relative: 7 %
Neutro Abs: 15.5 10*3/uL — ABNORMAL HIGH (ref 1.7–7.7)
Neutrophils Relative %: 77 %
Platelets: 376 10*3/uL (ref 150–400)
RBC: 2.79 MIL/uL — ABNORMAL LOW (ref 3.87–5.11)
RDW: 19.1 % — ABNORMAL HIGH (ref 11.5–15.5)
WBC: 20.1 10*3/uL — ABNORMAL HIGH (ref 4.0–10.5)
nRBC: 0.7 % — ABNORMAL HIGH (ref 0.0–0.2)

## 2022-08-18 LAB — BLOOD GAS, VENOUS
Acid-base deficit: 2.7 mmol/L — ABNORMAL HIGH (ref 0.0–2.0)
Bicarbonate: 20.4 mmol/L (ref 20.0–28.0)
O2 Saturation: 46.3 %
Patient temperature: 37.1
pCO2, Ven: 30 mmHg — ABNORMAL LOW (ref 44–60)
pH, Ven: 7.44 — ABNORMAL HIGH (ref 7.25–7.43)
pO2, Ven: 31 mmHg — CL (ref 32–45)

## 2022-08-18 LAB — PROTIME-INR
INR: 1.2 (ref 0.8–1.2)
Prothrombin Time: 15.3 seconds — ABNORMAL HIGH (ref 11.4–15.2)

## 2022-08-18 LAB — LACTIC ACID, PLASMA: Lactic Acid, Venous: 2.4 mmol/L (ref 0.5–1.9)

## 2022-08-18 MED ORDER — SODIUM CHLORIDE 0.9 % IV SOLN
2.0000 g | Freq: Once | INTRAVENOUS | Status: AC
  Administered 2022-08-18: 2 g via INTRAVENOUS
  Filled 2022-08-18: qty 12.5

## 2022-08-18 MED ORDER — SODIUM CHLORIDE 0.9 % IV SOLN
2.0000 g | Freq: Two times a day (BID) | INTRAVENOUS | Status: DC
  Administered 2022-08-19 – 2022-08-20 (×4): 2 g via INTRAVENOUS
  Filled 2022-08-18 (×6): qty 12.5

## 2022-08-18 MED ORDER — VANCOMYCIN HCL 1250 MG/250ML IV SOLN
1250.0000 mg | INTRAVENOUS | Status: AC
  Administered 2022-08-19 – 2022-08-26 (×8): 1250 mg via INTRAVENOUS
  Filled 2022-08-18 (×8): qty 250

## 2022-08-18 MED ORDER — LACTATED RINGERS IV BOLUS (SEPSIS)
1000.0000 mL | Freq: Once | INTRAVENOUS | Status: AC
  Administered 2022-08-18: 1000 mL via INTRAVENOUS

## 2022-08-18 MED ORDER — LACTATED RINGERS IV SOLN: INTRAVENOUS | Status: DC

## 2022-08-18 MED ORDER — POTASSIUM CHLORIDE CRYS ER 20 MEQ PO TBCR: 40.0000 meq | EXTENDED_RELEASE_TABLET | Freq: Once | ORAL | Status: DC

## 2022-08-18 MED ORDER — VANCOMYCIN HCL 1500 MG/300ML IV SOLN
1500.0000 mg | Freq: Once | INTRAVENOUS | Status: AC
  Administered 2022-08-18: 1500 mg via INTRAVENOUS
  Filled 2022-08-18: qty 300

## 2022-08-18 MED ORDER — LACTATED RINGERS IV BOLUS (SEPSIS)
250.0000 mL | Freq: Once | INTRAVENOUS | Status: AC
  Administered 2022-08-18: 250 mL via INTRAVENOUS

## 2022-08-18 MED ORDER — SODIUM CHLORIDE 3 % IN NEBU
4.0000 mL | INHALATION_SOLUTION | Freq: Two times a day (BID) | RESPIRATORY_TRACT | Status: AC
  Administered 2022-08-18 – 2022-08-21 (×5): 4 mL via RESPIRATORY_TRACT
  Filled 2022-08-18 (×6): qty 4

## 2022-08-18 MED ORDER — VANCOMYCIN HCL IN DEXTROSE 1-5 GM/200ML-% IV SOLN: 1000.0000 mg | Freq: Once | INTRAVENOUS | Status: DC

## 2022-08-18 NOTE — Progress Notes (Signed)
Pt being followed by [facility] for Sepsis protocol. 

## 2022-08-18 NOTE — Progress Notes (Signed)
A consult was received from an ED physician for cefepime and vancomycin per pharmacy dosing.  The patient's profile has been reviewed for ht/wt/allergies/indication/available labs.    Agree with order placed by provider for cefepime 2 g IV x 1.  A one time order has been placed for vancomycin 1.5 g IV.    Further antibiotics/pharmacy consults should be ordered by admitting physician if indicated.                       Thank you, Suzzanne Cloud, PharmD, BCPS 08/18/2022  9:06 PM

## 2022-08-18 NOTE — Significant Event (Signed)
Brief PCCM progress note:  Patient recently discharged in the hospital with pneumonia.  Represents to the ED.  Chest x-ray reveals left-sided whiteout, consistent with atelectasis, volume loss with tracheal deviation to the left.  Likely mucous plug.  Left mainstem cutoff sign.  Would advise avoiding bronchoscopy given this is often a temporary fix to a chronic problem, usually neuromuscular weakness, weak cough, etc. Recommendations: --Hypertonic saline 3 times daily nebulized --Incentive spirometer --Flutter valve --Manual chest physiotherapy --Repeat chest x-ray in the a.m. --Formal consultation in the a.m., please contact us if this is no longer desired.

## 2022-08-18 NOTE — ED Notes (Signed)
Pt is a difficult stick. Attempted IV twice by this nurse, once by PA, once by EDP- Dr. Laverta Baltimore, twice by Iv specialist. IV specialist Cheral [place] was successful with 1 IV in right upper arm.

## 2022-08-18 NOTE — H&P (Signed)
History and Physical    Patient: Stephanie Benton DGU:[id number] DOB: 08/29/1950 DOA: 08/18/2022 DOS: the patient was seen and examined on 08/18/2022 PCP: [name], MD  Patient coming from: Home  Chief Complaint:  Chief Complaint  Patient presents with   Shortness of Breath   HPI: Stephanie Benton is a 72 y.o. female with medical history significant of Essential hypertension, hyperlipidemia, bipolar disorder, AKI, chronic abdominal pain, chronic pancreatitis, esophagitis tobacco abuse, COPD, who was just discharged today to skilled nursing facility after admission with severe sepsis as a result of community-acquired pneumonia and COVID-19 infection diagnosed on January 9.  She was brought back secondary to worsening shortness of breath and hypoxia.  Checks x-ray in the ER reveals left-sided whiteout consistent with atelectasis.  This was most likely mucous plugging.  There was evidence of volume loss with tracheal deviation to the left.  Patient also appears to have healthcare associated pneumonia.  She is being readmitted to the hospital for further evaluation and treatment.  Patient is weak and debilitated.  She meets sepsis criteria with a white count of 20,000, heart rate 128, oxygen sat is 91% on 3 L.  Review of Systems: As mentioned in the history of present illness. All other systems reviewed and are negative. Past Medical History:  Diagnosis Date   Hyperlipidemia    Hypertension    Prolonged QT interval 06/21/2022   Past Surgical History:  Procedure Laterality Date   ABDOMINAL HYSTERECTOMY     APPENDECTOMY     BIOPSY  05/17/2022   Procedure: BIOPSY;  Surgeon: Carol Ada, MD;  Location: [facility] ENDOSCOPY;  Service: Gastroenterology;;   CHOLECYSTECTOMY N/A 05/22/2022   Procedure: LAPAROSCOPIC CHOLECYSTECTOMY WITH INTRAOPERATIVE CHOLANGIOGRAM;  Surgeon: Coralie Keens, MD;  Location: [place];  Service: General;  Laterality: N/A;   ESOPHAGOGASTRODUODENOSCOPY (EGD) WITH PROPOFOL  N/A 05/17/2022   Procedure: ESOPHAGOGASTRODUODENOSCOPY (EGD) WITH PROPOFOL;  Surgeon: Carol Ada, MD;  Location: [place];  Service: Gastroenterology;  Laterality: N/A;   ESOPHAGOGASTRODUODENOSCOPY (EGD) WITH PROPOFOL N/A 07/12/2022   Procedure: ESOPHAGOGASTRODUODENOSCOPY (EGD) WITH PROPOFOL;  Surgeon: Carol Ada, MD;  Location: [facility] ENDOSCOPY;  Service: Gastroenterology;  Laterality: N/A;   HERNIA REPAIR     TONSILLECTOMY     TUBAL LIGATION     Social History:  reports that she has been smoking cigarettes. She has been smoking an average of .75 packs per day. She has never used smokeless tobacco. She reports that she does not drink alcohol and does not use drugs.  Allergies  Allergen Reactions   Fish Allergy Anaphylaxis   Morphine And Related Anaphylaxis and Swelling   Belsomra [Suvorexant] Other (See Comments)    Nightmares     Codeine Nausea And Vomiting   Duragesic-100 [Fentanyl] Hives   Motrin [Ibuprofen] Nausea Only and Other (See Comments)    History of stomach ulcers   Nsaids Other (See Comments)    History of stomach ulcers   Penicillins Hives    Did it involve swelling of the face/tongue/throat, SOB, or low BP? Yes Did it involve sudden or severe rash/hives, skin peeling, or any reaction on the inside of your mouth or nose? Yes Did you need to seek medical attention at a hospital or doctor's office? Yes When did it last happen? More than 10 years ago If all above answers are "NO", may proceed with cephalosporin use.    Sulfa Antibiotics Swelling    Not listed on MAR   Tolectin [Tolmetin] Other (See Comments)    History of  stomach ulcers Not listed on MAR    Family History  Problem Relation Age of Onset   Heart failure Mother    Cancer Father    Cancer Sister     Prior to Admission medications   Medication Sig Start Date End Date Taking? Authorizing Provider  ascorbic acid (VITAMIN C) 500 MG tablet Take 1 tablet (500 mg total) by mouth daily. Patient  taking differently: Take 500 mg by mouth in the morning. (1000) 07/23/22   Dahal, Marlowe Aschoff, MD  budesonide-formoterol (SYMBICORT) 160-4.5 MCG/ACT inhaler Inhale 2 puffs into the lungs every 12 (twelve) hours as needed (wheezing).    [provider]  carvedilol (COREG) 25 MG tablet Take 1 tablet (25 mg total) by mouth 2 (two) times daily with a meal. 08/17/22   Charlynne Cousins, MD  cefdinir (OMNICEF) 300 MG capsule Take 1 capsule (300 mg total) by mouth every 12 (twelve) hours for 2 days. 08/17/22 08/19/22  Charlynne Cousins, MD  dorzolamide (TRUSOPT) 2 % ophthalmic solution Place 1 drop into both eyes 2 (two) times daily. (1000, 1800)    [provider]  DULoxetine HCl 40 MG CSDR Take 40 mg by mouth in the morning. (1000)    [provider]  Emollient (COLLAGEN EX) Apply 1 application  topically See admin instructions. Cleanse right knee, pat dry, apply collagen and cover with silicone dressing every Monday, Wednesday, Friday during day shift.    [provider]  ferrous sulfate 325 (65 FE) MG tablet Take 325 mg by mouth 2 (two) times daily. (1000, 1800)    [provider]  latanoprost (XALATAN) 0.005 % ophthalmic solution Place 1 drop into both eyes at bedtime. (2100)    [provider]  losartan (COZAAR) 100 MG tablet Take 0.5 tablets (50 mg total) by mouth daily. (1000) 08/17/22   Charlynne Cousins, MD  Multiple Vitamins-Minerals (MULTIVITAMIN WITH MINERALS) tablet Take 1 tablet by mouth in the morning. (1000)    [provider]  Nutritional Supplements (ENSURE ORIGINAL) LIQD Take 1 each by mouth 2 (two) times daily. (1000, 1800)    [provider]  ondansetron (ZOFRAN) 4 MG tablet Take 4 mg by mouth every 8 (eight) hours as needed for nausea or vomiting.    [provider]  pantoprazole (PROTONIX) 40 MG tablet Take 40 mg by mouth 2 (two) times daily. (0600, 1600)    [provider]  polyethylene glycol  powder (GLYCOLAX/MIRALAX) 17 GM/SCOOP powder Take 1 cap full (17 g) with water by mouth daily. Patient taking differently: Take 17 g by mouth in the morning. (1000) 05/24/22   Elodia Florence., MD  potassium chloride (KLOR-CON) 10 MEQ tablet Take 10 mEq by mouth 2 (two) times daily. (0900, 1800)    [provider]  tolterodine (DETROL) 2 MG tablet Take 2 mg by mouth in the morning. (1000)    [provider]  torsemide (DEMADEX) 10 MG tablet Take 10 mg by mouth daily as needed (edema).    [provider]  traMADol (ULTRAM) 50 MG tablet Take 50 mg by mouth every 8 (eight) hours as needed for severe pain or moderate pain.    [provider]  Vitamin D, Ergocalciferol, (DRISDOL) 1.25 MG (50000 UNIT) CAPS capsule Take 1 capsule (50,000 Units total) by mouth every 7 (seven) days. Patient taking differently: Take 50,000 Units by mouth See admin instructions. 50,000 units once a week on Tuesday (1000) 07/24/22   Terrilee Croak, MD  Physical Exam: Vitals:   08/18/22 1753 08/18/22 1915 08/18/22 1930 08/18/22 2105  BP:  134/89 127/67 (!) 127/99  Pulse:  (!) 127 (!) 121 (!) 127  Resp:  (!) 32 17 17  Temp:      TempSrc:      SpO2:  92% 96% 94%  Weight: 73.9 kg     Height: 5\' 5"  (1.651 m)      Constitutional: Chronically ill looking, weak, no distress Eyes: PERRL, lids and conjunctivae normal ENMT: Mucous membranes are moist. Posterior pharynx clear of any exudate or lesions.Normal dentition.  Neck: normal, supple, no masses, no thyromegaly Respiratory: Decreased air entry bilaterally more on the left with rhonchi, no wheezing, no crackles. Normal respiratory effort. No accessory muscle use.  Cardiovascular: Sinus tachycardia, no murmurs / rubs / gallops. No extremity edema. 2+ pedal pulses. No carotid bruits.  Abdomen: no tenderness, no masses palpated. No hepatosplenomegaly. Bowel sounds positive.  Musculoskeletal: Good range of motion, no joint swelling or  tenderness, Skin: no rashes, lesions, ulcers. No induration Neurologic: CN 2-12 grossly intact. Sensation intact, DTR normal. Strength 5/5 in all 4.  Psychiatric: Drowsy normal mood  Data Reviewed:  Temperature 98.6 blood pressure 127/99, pulse 128 respiratory 22 oxygen sat 91% on 3 L.  White count 20.1, hemoglobin 7.7 and platelets 376.  Sodium 140 potassium 3.0 chloride 115 CO2 of 17.  Lactic acid 2.4. Chest x-ray showed new complete opacification of the left hemithorax with left mediastinal shift and volume loss in the left hemithorax.  Findings consistent with mucous plugging.  Assessment and Plan:  #1 severe sepsis due to pneumonia: Appears to have HCAP.  Patient will be readmitted.  Initiate treatment with IV vancomycin and cefepime and For healthcare associated pneumonia.  Cultures were reobtained.  #2 left lung mucous plugging: Appears to have collapsed lungs versus severe atelectasis.  Pulmonary consulted already.  Recommendations noted Hypertonic saline with incentive spirometer and flutter valve.  Also manual chest physiotherapy.  Ask for formal consultation in the a.m. if needed.  Patient will be admitted therefore to progressive care.  #3 recent COVID-19 infection: Diagnosed on the ninth.  This is day 6.  Continue isolation and supportive care.  #4 acute kidney injury: Resolving.  #5 hypokalemia: Continue to replete.  #6 normocytic anemia: Hemoglobin is 7.7 which is slightly up.  Continue to monitor  #7 bipolar disorder: In full remission.  Continue home regimen  #8 tobacco abuse: Tobacco cessation counseling with nicotine patch     Advance Care Planning:   Code Status: Prior DNR  Consults: Pulmonary consult in the morning  Family Communication: No family at bedside  Severity of Illness: The appropriate patient status for this patient is INPATIENT. Inpatient status is judged to be reasonable and necessary in order to provide the required intensity of service to ensure  the patient's safety. The patient's presenting symptoms, physical exam findings, and initial radiographic and laboratory data in the context of their chronic comorbidities is felt to place them at high risk for further clinical deterioration. Furthermore, it is not anticipated that the patient will be medically stable for discharge from the hospital within 2 midnights of admission.   * I certify that at the point of admission it is my clinical judgment that the patient will require inpatient hospital care spanning beyond 2 midnights from the point of admission due to high intensity of service, high risk for further deterioration and high frequency of surveillance required.*  Author , MD 08/18/2022 9:46 PM  For on call review [email or web address].

## 2022-08-18 NOTE — TOC Progression Note (Signed)
Transition of Care [facility] - Progression Note    Patient Details  Name: Stephanie Benton MRN: [id number] Date of Birth: [birth year]  Transition of Care [facility]) CM/SW [place], [place] Phone Number: 08/18/2022, 9:49 AM  Clinical Narrative:     CSW received insurance authorization for patient. Plan Auth ID# [id number] [facility] ID# [id number]. Insurance authorization approved from 1/13-1/17. CSW informed MD. CSW will continue to follow and assist with patients dc planning needs.   Expected Discharge Plan: [place] Barriers to Discharge: Continued Medical Work up  Expected Discharge Plan and Services In-house Referral: Clinical Social Work     Living arrangements for the past 2 months:  (Patient came from [facility] short term) Expected Discharge Date: 08/17/22                                     Social Determinants of Health ([place]) Interventions [place]: No Food Insecurity (07/09/2022)  Housing: Low Risk  (07/09/2022)  Recent Concern: Housing - Medium Risk (06/21/2022)  Transportation Needs: No Transportation Needs (07/09/2022)  Recent Concern: Transportation Needs - Unmet Transportation Needs (06/21/2022)  Utilities: Not At Risk (07/09/2022)  Tobacco Use: High Risk (07/15/2022)    Readmission Risk Interventions    07/22/2022   11:58 AM 07/18/2022    8:43 AM 07/10/2022   10:49 AM  Readmission Risk Prevention Plan  Transportation Screening  Complete Complete  Medication Review ([place])  Complete Complete  PCP or Specialist appointment within 3-5 days of discharge  Complete Complete  HRI or Home Care Consult  Complete Complete  SW Recovery Care/Counseling Consult  Complete Complete  Palliative Care Screening  Not Applicable Not [place] Complete Not Applicable

## 2022-08-18 NOTE — ED Notes (Signed)
RT note: Pt. seen while in [facility]/ED currently on 3 lpm n/c with humidity added, Flutter device started with minimal effort by pt. although was only able to achieve ~ X5 short duration expiratory breaths to activate device, RT to follow for bed assignment.

## 2022-08-18 NOTE — Progress Notes (Signed)
Pharmacy Antibiotic Note  Stephanie Benton is a 72 y.o. female admitted on 08/18/2022 with  medical history significant of Essential hypertension, hyperlipidemia, bipolar disorder, AKI, chronic abdominal pain, chronic pancreatitis, esophagitis tobacco abuse, COPD, who was just discharged today to skilled nursing facility after admission with severe sepsis as a result of community-acquired pneumonia and COVID-19 infection diagnosed on January 9.  She was brought back secondary to worsening shortness of breath and hypoxia .  Pharmacy has been consulted to dose vancomycin and cefepime for pna.  1st doses given in the ED  Plan: Vancomycin 1250mg  IV q24h (AUC 509.2, Scr 0.91) Cefepime 2gm IV q12h Follow renal function, cultures and clinical course  Height: 5\' 5"  (165.1 cm) Weight: 73.9 kg (162 lb 14.7 oz) IBW/kg (Calculated) : 57  Temp (24hrs), Avg:98.7 F (37.1 C), Min:98.6 F (37 C), Max:98.8 F (37.1 C)  Recent Labs  Lab 08/13/22 1440 08/13/22 1814 08/13/22 2004 08/14/22 0120 08/14/22 0453 08/14/22 0454 08/15/22 0800 08/16/22 0540 08/17/22 0141 08/18/22 2025  WBC 24.6*  --   --   --   --  23.2* 15.9* 11.8*  --  20.1*  CREATININE 1.18*   < >  --   --   --  1.07* 0.94 0.86 0.76 0.91  LATICACIDVEN 4.9*  --  1.0 0.9 1.4  --   --   --   --  2.4*   < > = values in this interval not displayed.    Estimated Creatinine Clearance: 56.3 mL/min (by C-G formula based on SCr of 0.91 mg/dL).    Allergies  Allergen Reactions   Fish Allergy Anaphylaxis   Morphine And Related Anaphylaxis and Swelling   Belsomra [Suvorexant] Other (See Comments)    Nightmares     Codeine Nausea And Vomiting   Duragesic-100 [Fentanyl] Hives   Motrin [Ibuprofen] Nausea Only and Other (See Comments)    History of stomach ulcers   Nsaids Other (See Comments)    History of stomach ulcers   Penicillins Hives    Did it involve swelling of the face/tongue/throat, SOB, or low BP? Yes Did it involve sudden or  severe rash/hives, skin peeling, or any reaction on the inside of your mouth or nose? Yes Did you need to seek medical attention at a hospital or doctor's office? Yes When did it last happen? More than 10 years ago If all above answers are "NO", may proceed with cephalosporin use.    Sulfa Antibiotics Swelling    Not listed on MAR   Tolectin [Tolmetin] Other (See Comments)    History of stomach ulcers Not listed on MAR    Antimicrobials this admission: 1/14 cefepime >> 1/14 vanc >>  Dose adjustments this admission:   Microbiology results: 1/14 BCx:    Thank you for allowing pharmacy to be a part of this patient's care.  Dolly Rias RPh 08/18/2022, 10:28 PM

## 2022-08-18 NOTE — ED Notes (Signed)
Respiratory Therapist in room working with pt at this time.

## 2022-08-18 NOTE — Progress Notes (Signed)
[facility] PROGRESS NOTE    Progress Note  Stephanie Benton  HGD:[id number] DOB: 04/16/1951 DOA: 08/13/2022 PCP: [name], MD     Brief Narrative:   Stephanie Benton is an 72 y.o. female past medical history of hypertension, chronic abdominal pain pancreatitis and esophagitis, recently discharged from the hospital from sepsis last month due to community-acquired pneumonia.  Was febrile hypotensive as well as hypoxic requiring a nonrebreather   Assessment/Plan:   Severe sepsis with acute organ dysfunction due to CAP (community acquired pneumonia) Has been weaned to room air, continue Omnicef and azithromycin for total of 7 days. She is currently awaiting skilled nursing facility placement.  Acute respiratory failure with hypoxia: She has been weaned to room air  COVID-19 infection: Recently diagnosed, currently asymptomatic from.  Acute metabolic encephalopathy: Likely due to infectious etiology. Now resolved.  Acute kidney injury: Likely due to sepsis, with a baseline creatinine of less than than 1. Creatinine improved with fluid resuscitation. KVO IV fluids.  Normocytic anemia: Hemoglobin is around 8.5-7.5. Baseline is around 8. Continue to monitor.  Bipolar disorder: In full remission continue Current home medication.  Essential hypertension: Restart Coreg hold ARB and torsemide. Started on low-dose Coreg continue to monitor blood pressure.  Tobacco abuse: Noted.  Hypokalemia: Replete orally  DVT prophylaxis: SCD Family Communication:none Status is: Inpatient Remains inpatient appropriate because: Acute respiratory failure/severe sepsis due to pneumonia.    Code Status:     Code Status Orders  (From admission, onward)           Start     Ordered   08/13/22 1932  Do not attempt resuscitation (DNR)  Continuous       Question Answer Comment  If patient has no pulse and is not breathing Do Not Attempt Resuscitation   If patient has a  pulse and/or is breathing: Medical Treatment Goals LIMITED ADDITIONAL INTERVENTIONS: Use medication/IV fluids and cardiac monitoring as indicated; Do not use intubation or mechanical ventilation (DNI), also provide comfort medications.  Transfer to Progressive/Stepdown as indicated, avoid Intensive Care.   Consent: Discussion documented in EHR or advanced directives reviewed      08/13/22 1932           Code Status History     Date Active Date Inactive Code Status Order ID Comments User Context   07/20/2022 1729 07/22/2022 1925 DNR [id number]  Terrilee Croak, MD Inpatient   07/16/2022 0445 07/20/2022 1729 Full Code [id number]  Shela Leff, MD ED   07/08/2022 0840 07/13/2022 1806 Full Code [id number]  Reubin Milan, MD ED   06/21/2022 1753 06/25/2022 1934 Full Code [id number]  Reubin Milan, MD Inpatient   06/21/2022 1555 06/21/2022 1753 Full Code [id number]  Reubin Milan, MD ED   05/17/2022 0147 05/23/2022 1715 Full Code [id number]  Toy Baker, MD ED   02/17/2018 0446 02/18/2018 1413 Full Code [id number]  Vianne Bulls, MD Inpatient         IV Access:   Peripheral IV   Procedures and diagnostic studies:   No results found.   Medical Consultants:   None.   Subjective:    Stephanie Benton no new complaint this morning  Objective:    Vitals:   08/17/22 2102 08/18/22 0000 08/18/22 0331 08/18/22 0333  BP:  118/74  124/77  Pulse: 95 90  (!) 101  Resp: 14 16  17   Temp:  98.8 F (37.1 C)  98.7 F (37.1 C)  TempSrc:  Oral  Oral  SpO2: 95%  98% 99%  Weight:       SpO2: 99 % O2 Flow Rate (L/min): 1 L/min FiO2 (%): 28 %   Intake/Output Summary (Last 24 hours) at 08/18/2022 0831 Last data filed at 08/17/2022 2300 Gross per 24 hour  Intake 60 ml  Output --  Net 60 ml    Filed Weights   08/13/22 2201 08/17/22 0600  Weight: 86.2 kg 73.9 kg    Exam: General exam: In no acute distress. Respiratory system: Good air movement and clear  to auscultation. Cardiovascular system: S1 & S2 heard, RRR. No JVD. Gastrointestinal system: Abdomen is nondistended, soft and nontender.  Extremities: No pedal edema. Skin: No rashes, lesions or ulcers Psychiatry: Judgement and insight appear normal. Mood & affect appropriate. Data Reviewed:    Labs: Basic Metabolic Panel: Recent Labs  Lab 08/13/22 1814 08/13/22 2004 08/14/22 0454 08/15/22 0800 08/16/22 0540 08/17/22 0141  NA 144  --  142 145 144 143  K 3.7  --  3.2* 3.5 3.4* 3.6  CL 115*  --  113* 118* 117* 116*  CO2 20*  --  20* 20* 20* 20*  GLUCOSE 94  --  107* 81 83 87  BUN 11  --  14 10 <5* 5*  CREATININE 1.19*  --  1.07* 0.94 0.86 0.76  CALCIUM 7.6*  --  7.8* 8.2* 8.0* 8.3*  MG  --  1.9 1.9 1.9 1.8  --   PHOS  --  3.6 2.9 2.7 2.5  --     GFR Estimated Creatinine Clearance: 64 mL/min (by C-G formula based on SCr of 0.76 mg/dL). Liver Function Tests: Recent Labs  Lab 08/13/22 1440 08/13/22 1814 08/14/22 0454 08/15/22 0800 08/16/22 0540  AST 19 18 19 16 17   ALT 14 13 14 14 13   ALKPHOS 98 93 99 109 102  BILITOT 0.9 1.2 0.8 0.8 0.9  PROT 4.9* 4.6* 5.5* 5.3* 5.0*  ALBUMIN 1.7* 1.6* 2.2* 2.0* 1.9*    Recent Labs  Lab 08/13/22 2004  LIPASE 46    No results for input(s): "AMMONIA" in the last 168 hours. Coagulation profile Recent Labs  Lab 08/13/22 1440  INR 1.4*    COVID-19 Labs  Recent Labs    08/16/22 0540  FERRITIN 350*  CRP 7.5*     Lab Results  Component Value Date   SARSCOV2NAA POSITIVE (A) 08/13/2022   SARSCOV2NAA NEGATIVE 07/15/2022   SARSCOV2NAA NEGATIVE 05/17/2022   SARSCOV2NAA NEGATIVE 10/11/2019    CBC: Recent Labs  Lab 08/13/22 1417 08/13/22 1440 08/14/22 0454 08/15/22 0800 08/16/22 0540  WBC  --  24.6* 23.2* 15.9* 11.8*  NEUTROABS  --  21.6* 19.8* 13.1* 9.0*  HGB 10.2* 8.5* 7.5* 7.7* 7.4*  HCT 30.0* 24.7* 21.6* 23.5* 22.4*  MCV  --  92.9 92.7 89.4 87.5  PLT  --  334 300 346 308    Cardiac Enzymes: Recent  Labs  Lab 08/13/22 1814  CKTOTAL 29*    BNP (last 3 results) No results for input(s): "PROBNP" in the last 8760 hours. CBG: Recent Labs  Lab 08/14/22 2103 08/15/22 0758 08/15/22 1118 08/15/22 1743 08/15/22 2138  GLUCAP 94 92 93 95 99    D-Dimer: No results for input(s): "DDIMER" in the last 72 hours.  Hgb A1c: No results for input(s): "HGBA1C" in the last 72 hours. Lipid Profile: No results for input(s): "CHOL", "HDL", "LDLCALC", "TRIG", "CHOLHDL", "LDLDIRECT" in the last 72 hours. Thyroid function studies: No results for input(s): "TSH", "T4TOTAL", "T3FREE", "  THYROIDAB" in the last 72 hours.  Invalid input(s): "FREET3"  Anemia work up: Recent Labs    08/16/22 0540  FERRITIN 350*    Sepsis Labs: Recent Labs  Lab 08/13/22 1440 08/13/22 1814 08/13/22 2004 08/14/22 0120 08/14/22 0453 08/14/22 0454 08/15/22 0800 08/16/22 0540  PROCALCITON  --  2.50 2.85  --   --  3.36 2.46  --   WBC 24.6*  --   --   --   --  23.2* 15.9* 11.8*  LATICACIDVEN 4.9*  --  1.0 0.9 1.4  --   --   --     Microbiology Recent Results (from the past 240 hour(s))  Resp panel by RT-PCR (RSV, Flu A&B, Covid) Anterior Nasal Swab     Status: Abnormal   Collection Time: 08/13/22  1:44 PM   Specimen: Anterior Nasal Swab  Result Value Ref Range Status   SARS Coronavirus 2 by RT PCR POSITIVE (A) NEGATIVE Final    Comment: (NOTE) SARS-CoV-2 target nucleic acids are DETECTED.  The SARS-CoV-2 RNA is generally detectable in upper respiratory specimens during the acute phase of infection. Positive results are indicative of the presence of the identified virus, but do not rule out bacterial infection or co-infection with other pathogens not detected by the test. Clinical correlation with patient history and other diagnostic information is necessary to determine patient infection status. The expected result is Negative.  Fact Sheet for  Patients: EntrepreneurPulse.com.au  Fact Sheet for Healthcare Providers: IncredibleEmployment.be  This test is not yet approved or cleared by the [place] FDA and  has been authorized for detection and/or diagnosis of SARS-CoV-2 by FDA under an Emergency Use Authorization (EUA).  This EUA will remain in effect (meaning this test can be used) for the duration of  the COVID-19 declaration under Section 564(b)(1) of the A ct, 21 U.S.C. section 360bbb-3(b)(1), unless the authorization is terminated or revoked sooner.     Influenza A by PCR NEGATIVE NEGATIVE Final   Influenza B by PCR NEGATIVE NEGATIVE Final    Comment: (NOTE) The Xpert Xpress SARS-CoV-2/FLU/RSV plus assay is intended as an aid in the diagnosis of influenza from Nasopharyngeal swab specimens and should not be used as a sole basis for treatment. Nasal washings and aspirates are unacceptable for Xpert Xpress SARS-CoV-2/FLU/RSV testing.  Fact Sheet for Patients: EntrepreneurPulse.com.au  Fact Sheet for Healthcare Providers: IncredibleEmployment.be  This test is not yet approved or cleared by the [place] FDA and has been authorized for detection and/or diagnosis of SARS-CoV-2 by FDA under an Emergency Use Authorization (EUA). This EUA will remain in effect (meaning this test can be used) for the duration of the COVID-19 declaration under Section 564(b)(1) of the Act, 21 U.S.C. section 360bbb-3(b)(1), unless the authorization is terminated or revoked.     Resp Syncytial Virus by PCR NEGATIVE NEGATIVE Final    Comment: (NOTE) Fact Sheet for Patients: EntrepreneurPulse.com.au  Fact Sheet for Healthcare Providers: IncredibleEmployment.be  This test is not yet approved or cleared by the [place] FDA and has been authorized for detection and/or diagnosis of SARS-CoV-2 by FDA under an Emergency Use  Authorization (EUA). This EUA will remain in effect (meaning this test can be used) for the duration of the COVID-19 declaration under Section 564(b)(1) of the Act, 21 U.S.C. section 360bbb-3(b)(1), unless the authorization is terminated or revoked.  Performed at [facility], [place] [address] [place], [place] [address]   Blood Culture (routine x 2)     Status:  None   Collection Time: 08/13/22  2:40 PM   Specimen: BLOOD  Result Value Ref Range Status   Specimen Description BLOOD LEFT ANTECUBITAL  Final   Special Requests   Final    BOTTLES DRAWN AEROBIC AND ANAEROBIC Blood Culture adequate volume   Culture   Final    NO GROWTH 5 DAYS Performed at [facility], [address], [place], [place] [address]    Report Status 08/18/2022 FINAL  Final  Blood Culture (routine x 2)     Status: None   Collection Time: 08/13/22  4:43 PM   Specimen: BLOOD  Result Value Ref Range Status   Specimen Description BLOOD CENTRAL LINE  Final   Special Requests   Final    BOTTLES DRAWN AEROBIC AND ANAEROBIC Blood Culture adequate volume   Culture   Final    NO GROWTH 5 DAYS Performed at [facility], [address] [place], [place] [address]    Report Status 08/18/2022 FINAL  Final  MRSA Next Gen by PCR, Nasal     Status: Abnormal   Collection Time: 08/14/22 12:29 PM   Specimen: Nasal Mucosa; Nasal Swab  Result Value Ref Range Status   MRSA by PCR Next Gen DETECTED (A) NOT DETECTED Final    Comment: RESULT CALLED TO, READ BACK BY AND VERIFIED WITH: RN Y. IMAI [id number] @1516  FH` (NOTE) The GeneXpert MRSA Assay (FDA approved for NASAL specimens only), is one component of a comprehensive MRSA colonization surveillance program. It is not intended to diagnose MRSA infection nor to guide or monitor treatment for MRSA infections. Test performance is not FDA approved in patients less than 39 years old. Performed at [facility], [address] [place], [place]       Medications:    (feeding supplement) PROSource Plus  30 mL Oral BID BM   amLODipine  5 mg Oral Daily   ascorbic acid  500 mg Oral Daily   carvedilol  25 mg Oral BID WC   Chlorhexidine Gluconate Cloth  6 each Topical Daily   feeding supplement  1 Container Oral TID WC   fesoterodine  4 mg Oral Daily   Gerhardt's butt cream  1 Application Topical TID   latanoprost  1 drop Both Eyes QHS   mometasone-formoterol  2 puff Inhalation BID   multivitamin  1 tablet Oral Daily   mupirocin ointment  1 Application Nasal BID   nicotine  21 mg Transdermal Daily   [START ON 08/20/2022] Vitamin D (Ergocalciferol)  50,000 Units Oral Q7 days   Vitamin E  400 Units Oral Q1200   Continuous Infusions:      LOS: 5 days   08/22/2022  [facility]  08/18/2022, 8:31 AM

## 2022-08-18 NOTE — ED Triage Notes (Signed)
Pt BIB [facility] for increased AMS and SOB. Pt on NRB with sats 92%. Per EMS pt is normally on room air and was 84% when they arrived on scene.  Pt was just discharged from [facility] for pneumonia. Pt Oriented to self and DOB.

## 2022-08-18 NOTE — TOC Transition Note (Signed)
Transition of Care [facility]) - CM/SW Discharge Note   Patient Details  Name: Stephanie Benton MRN: [id number] Date of Birth: [birth year]  [facility] CM/SW Contact:  Milas Gain, Rosalie Phone Number: 08/18/2022, 10:31 AM   Clinical Narrative:     Patient will DC to: [facility]  Anticipated DC date: 08/18/2021  Family notified: [name]  Transport by: Corey Harold  ?  Per MD patient ready for DC to [facility] with palliative services to follow . RN, patient, patient's family, [name] with [facility], and facility notified of DC. Discharge Summary sent to facility. RN given number for report tele# [phone] RM# [phone]. DC packet on chart. DNR signed by MD attached to patients DC packet.Ambulance transport requested for patient.  CSW signing off.   Final next level of care: Skilled Nursing Facility Barriers to Discharge: No Barriers Identified   Patient Goals and CMS Choice CMS Medicare.gov Compare Post Acute Care list provided to:: Patient Represenative (must comment) (Patients son [name]) Choice offered to / list presented to : Adult Children (Patients son [name])  Discharge Placement                Patient chooses bed at: [facility] Patient to be transferred to facility by: [place] Name of family member notified: [name] Patient and family notified of of transfer: 08/18/22  Discharge Plan and Services Additional resources added to the After Visit Summary for   In-house Referral: Clinical Social Work                                   Social Determinants of Health ([place]) Interventions [place]: No Food Insecurity (07/09/2022)  Housing: Low Risk  (07/09/2022)  Recent Concern: Housing - Medium Risk (06/21/2022)  Transportation Needs: No Transportation Needs (07/09/2022)  Recent Concern: Transportation Needs - Unmet Transportation Needs (06/21/2022)  Utilities: Not At Risk (07/09/2022)  Tobacco Use: High Risk (07/15/2022)     Readmission Risk  Interventions    07/22/2022   11:58 AM 07/18/2022    8:43 AM 07/10/2022   10:49 AM  Readmission Risk Prevention Plan  Transportation Screening  Complete Complete  Medication Review ([place])  Complete Complete  PCP or Specialist appointment within 3-5 days of discharge  Complete Complete  HRI or Home Care Consult  Complete Complete  SW Recovery Care/Counseling Consult  Complete Complete  Palliative Care Screening  Not Applicable Not [place] Complete Not Applicable

## 2022-08-18 NOTE — ED Provider Notes (Signed)
[facility] DEPT Provider Note   CSN: [id number] Arrival date & time: 08/18/22  1736     History {Add pertinent medical, surgical, social history, OB history to HPI:1} Chief Complaint  Patient presents with   Shortness of Breath    Stephanie Benton is a 72 y.o. female with past medical history significant for hypertension, chronic abdominal pain, pancreatitis, esophagitis with multiple recent hospitalizations for sepsis, pneumonia who presents with concern for respiratory distress, tachycardia, hypoxia on room air, patient is alert and oriented to self, date of birth but not time, place, situation.  She was discharged to a rehab facility, who reported that she was slightly more altered than baseline.  She does have a documented history of COPD, she denies any other pain or symptoms at this time.  Patient was found to 84% on room air when EMS was called, she is on nonrebreather with 92% oxygen saturation.  She was discharged just yesterday for pneumonia presentation.  Level 5 caveat: altered mental status   Shortness of Breath      Home Medications Prior to Admission medications   Medication Sig Start Date End Date Taking? Authorizing Provider  ascorbic acid (VITAMIN C) 500 MG tablet Take 1 tablet (500 mg total) by mouth daily. Patient taking differently: Take 500 mg by mouth in the morning. (1000) 07/23/22   Dahal, Marlowe Aschoff, MD  budesonide-formoterol (SYMBICORT) 160-4.5 MCG/ACT inhaler Inhale 2 puffs into the lungs every 12 (twelve) hours as needed (wheezing).    [provider]  carvedilol (COREG) 25 MG tablet Take 1 tablet (25 mg total) by mouth 2 (two) times daily with a meal. 08/17/22   Charlynne Cousins, MD  cefdinir (OMNICEF) 300 MG capsule Take 1 capsule (300 mg total) by mouth every 12 (twelve) hours for 2 days. 08/17/22 08/19/22  Charlynne Cousins, MD  dorzolamide (TRUSOPT) 2 % ophthalmic solution Place 1 drop into both eyes 2 (two)  times daily. (1000, 1800)    [provider]  DULoxetine HCl 40 MG CSDR Take 40 mg by mouth in the morning. (1000)    [provider]  Emollient (COLLAGEN EX) Apply 1 application  topically See admin instructions. Cleanse right knee, pat dry, apply collagen and cover with silicone dressing every Monday, Wednesday, Friday during day shift.    [provider]  ferrous sulfate 325 (65 FE) MG tablet Take 325 mg by mouth 2 (two) times daily. (1000, 1800)    [provider]  latanoprost (XALATAN) 0.005 % ophthalmic solution Place 1 drop into both eyes at bedtime. (2100)    [provider]  losartan (COZAAR) 100 MG tablet Take 0.5 tablets (50 mg total) by mouth daily. (1000) 08/17/22   Charlynne Cousins, MD  Multiple Vitamins-Minerals (MULTIVITAMIN WITH MINERALS) tablet Take 1 tablet by mouth in the morning. (1000)    [provider]  Nutritional Supplements (ENSURE ORIGINAL) LIQD Take 1 each by mouth 2 (two) times daily. (1000, 1800)    [provider]  ondansetron (ZOFRAN) 4 MG tablet Take 4 mg by mouth every 8 (eight) hours as needed for nausea or vomiting.    [provider]  pantoprazole (PROTONIX) 40 MG tablet Take 40 mg by mouth 2 (two) times daily. (0600, 1600)    [provider]  polyethylene glycol powder (GLYCOLAX/MIRALAX) 17 GM/SCOOP powder Take 1 cap full (17 g) with water by mouth daily. Patient taking differently: Take 17 g by mouth in the morning. (1000) 05/24/22  Zigmund Daniel., MD  potassium chloride (KLOR-CON) 10 MEQ tablet Take 10 mEq by mouth 2 (two) times daily. (0900, 1800)    [provider]  tolterodine (DETROL) 2 MG tablet Take 2 mg by mouth in the morning. (1000)    [provider]  torsemide (DEMADEX) 10 MG tablet Take 10 mg by mouth daily as needed (edema).    [provider]  traMADol (ULTRAM) 50 MG tablet Take 50 mg by mouth every 8 (eight) hours as needed for  severe pain or moderate pain.    [provider]  Vitamin D, Ergocalciferol, (DRISDOL) 1.25 MG (50000 UNIT) CAPS capsule Take 1 capsule (50,000 Units total) by mouth every 7 (seven) days. Patient taking differently: Take 50,000 Units by mouth See admin instructions. 50,000 units once a week on Tuesday (1000) 07/24/22   Dahal, Melina Schools, MD      Allergies    Fish allergy, Morphine and related, Belsomra [suvorexant], Codeine, Duragesic-100 [fentanyl], Motrin [ibuprofen], Nsaids, Penicillins, Sulfa antibiotics, and Tolectin [tolmetin]    Review of Systems   Review of Systems  Respiratory:  Positive for shortness of breath.     Physical Exam Updated Vital Signs BP 127/89 (BP Location: Right Arm)   Pulse (!) 128   Temp 98.6 F (37 C) (Oral)   Resp (!) 34   Ht 5\' 5"  (1.651 m)   Wt 73.9 kg   SpO2 91%   BMI 27.11 kg/m  Physical Exam Vitals and nursing note reviewed.  Constitutional:      General: She is not in acute distress.    Appearance: Normal appearance. She is ill-appearing.  HENT:     Head: Normocephalic and atraumatic.  Eyes:     General:        Right eye: No discharge.        Left eye: No discharge.  Cardiovascular:     Rate and Rhythm: Regular rhythm. Tachycardia present.     Heart sounds: No murmur heard.    No friction rub. No gallop.  Pulmonary:     Effort: Pulmonary effort is normal. Tachypnea present.     Breath sounds: Normal breath sounds.     Comments: No acute wheezing, rhonchi, stridor, rales Abdominal:     General: Bowel sounds are normal.     Palpations: Abdomen is soft.  Skin:    General: Skin is warm and dry.     Capillary Refill: Capillary refill takes less than 2 seconds.  Neurological:     Mental Status: She is alert and oriented to person, place, and time.  Psychiatric:        Mood and Affect: Mood normal.        Behavior: Behavior normal.     ED Results / Procedures / Treatments   Labs (all labs ordered are listed, but only  abnormal results are displayed) Labs Reviewed  CULTURE, BLOOD (ROUTINE X 2)  CULTURE, BLOOD (ROUTINE X 2)  COMPREHENSIVE METABOLIC PANEL  LACTIC ACID, PLASMA  LACTIC ACID, PLASMA  CBC WITH DIFFERENTIAL/PLATELET  PROTIME-INR  URINALYSIS, ROUTINE W REFLEX MICROSCOPIC  BLOOD GAS, VENOUS  CBG MONITORING, ED    EKG None  Radiology No results found.  Procedures Ultrasound ED Peripheral IV (Provider)  Date/Time: 08/18/2022 7:52 PM  Performed by: 08/20/2022, PA-C Authorized by: Olene Floss, PA-C   Procedure details:    Indications: multiple failed IV attempts     Skin Prep: isopropyl alcohol     Location:  Right  AC   Angiocath:  20 G   Bedside Ultrasound Guided: Yes     Images: not archived     Patient tolerated procedure without complications: Yes     Dressing applied: Yes   Comments:     Unsuccessful attempt with flash .Critical Care  Performed by: Anselmo Pickler, PA-C Authorized by: Anselmo Pickler, PA-C   Critical care provider statement:    Critical care time (minutes):  35   Critical care was necessary to treat or prevent imminent or life-threatening deterioration of the following conditions:  Sepsis   Critical care was time spent personally by me on the following activities:  Development of treatment plan with patient or surrogate, discussions with consultants, evaluation of patient's response to treatment, examination of patient, ordering and review of laboratory studies, ordering and review of radiographic studies, ordering and performing treatments and interventions, pulse oximetry, re-evaluation of patient's condition and review of old charts   Care discussed with: admitting provider     {Document cardiac monitor, telemetry assessment procedure when appropriate:1}  Medications Ordered in ED Medications - No data to display  ED Course/ Medical Decision Making/ A&P   {   Click here for ABCD2, HEART and other calculatorsREFRESH  Note before signing :1}                          Medical Decision Making Amount and/or Complexity of Data Reviewed Labs: ordered. Radiology: ordered.   This patient is a 72 y.o. female who presents to the ED for concern of hypoxia, septic presentation, difficulty breathing, this involves an extensive number of treatment options, and is a complaint that carries with it a high risk of complications and morbidity. The emergent differential diagnosis prior to evaluation includes, but is not limited to,  asthma exacerbation, COPD exacerbation, acute upper respiratory infection, acute bronchitis, chronic bronchitis, interstitial lung disease, ARDS, PE, pneumonia, atypical ACS, carbon monoxide poisoning, spontaneous pneumothorax, new CHF vs CHF exacerbation, versus  . This is not an exhaustive differential.   Past Medical History / Co-morbidities / Social History:  hypertension, chronic abdominal pain, pancreatitis, esophagitis with multiple recent hospitalizations for sepsis, pneumonia  Additional history: Chart reviewed. Pertinent results include: Extensively reviewed patient's very recent hospitalization for sepsis secondary to pneumonia, patient now returning, concern for possible hospital-acquired pneumonia  Physical Exam: Physical exam performed. The pertinent findings include: Patient tachycardic on arrival without fever, pulse rate at 125, she is also tachypneic, respirations 34 on arrival with some improvement after being placed on nonrebreather, nasal cannula, she is normally not on any oxygen at baseline  Lab Tests: I ordered, and personally interpreted labs.  The pertinent results include:  ***   Imaging Studies: I ordered imaging studies including ***. I independently visualized and interpreted imaging which showed ***. I agree with the radiologist interpretation.   Cardiac Monitoring:  The patient was maintained on a cardiac monitor.  My attending physician Dr. Marland Kitchen viewed and  interpreted the cardiac monitored which showed an underlying rhythm of: ***. I agree with this interpretation.   Medications: I ordered medication including ***  for ***. Reevaluation of the patient after these medicines showed that the patient {resolved/improved/worsened:23923::"improved"}. I have reviewed the patients home medicines and have made adjustments as needed.  Consultations Obtained: I requested consultation with the ***,  and discussed lab and imaging findings as well as pertinent plan - they recommend: ***   Disposition: After consideration of the diagnostic results  and the patients response to treatment, I feel that *** .   ***emergency department workup does not suggest an emergent condition requiring admission or immediate intervention beyond what has been performed at this time. The plan is: ***. The patient is safe for discharge and has been instructed to return immediately for worsening symptoms, change in symptoms or any other concerns.  I discussed this case with my attending physician Dr. Marland Kitchen who cosigned this note including patient's presenting symptoms, physical exam, and planned diagnostics and interventions. Attending physician stated agreement with plan or made changes to plan which were implemented.    Final Clinical Impression(s) / ED Diagnoses Final diagnoses:  None    Rx / DC Orders ED Discharge Orders     None

## 2022-08-19 ENCOUNTER — Inpatient Hospital Stay (HOSPITAL_COMMUNITY): Payer: 59

## 2022-08-19 DIAGNOSIS — R6520 Severe sepsis without septic shock: Secondary | ICD-10-CM | POA: Diagnosis not present

## 2022-08-19 DIAGNOSIS — J449 Chronic obstructive pulmonary disease, unspecified: Secondary | ICD-10-CM | POA: Diagnosis not present

## 2022-08-19 DIAGNOSIS — F3178 Bipolar disorder, in full remission, most recent episode mixed: Secondary | ICD-10-CM | POA: Diagnosis not present

## 2022-08-19 DIAGNOSIS — A419 Sepsis, unspecified organism: Principal | ICD-10-CM

## 2022-08-19 DIAGNOSIS — J189 Pneumonia, unspecified organism: Secondary | ICD-10-CM | POA: Diagnosis not present

## 2022-08-19 DIAGNOSIS — E876 Hypokalemia: Secondary | ICD-10-CM

## 2022-08-19 LAB — POC OCCULT BLOOD, ED: Fecal Occult Bld: POSITIVE — AB

## 2022-08-19 LAB — COMPREHENSIVE METABOLIC PANEL
ALT: 14 U/L (ref 0–44)
AST: 20 U/L (ref 15–41)
Albumin: 2 g/dL — ABNORMAL LOW (ref 3.5–5.0)
Alkaline Phosphatase: 77 U/L (ref 38–126)
Anion gap: 9 (ref 5–15)
BUN: 15 mg/dL (ref 8–23)
CO2: 19 mmol/L — ABNORMAL LOW (ref 22–32)
Calcium: 8 mg/dL — ABNORMAL LOW (ref 8.9–10.3)
Chloride: 116 mmol/L — ABNORMAL HIGH (ref 98–111)
Creatinine, Ser: 0.88 mg/dL (ref 0.44–1.00)
GFR, Estimated: >60 mL/min (ref >60)
Glucose, Bld: 90 mg/dL (ref 70–99)
Potassium: 3.1 mmol/L — ABNORMAL LOW (ref 3.5–5.1)
Sodium: 144 mmol/L (ref 135–145)
Total Bilirubin: 0.9 mg/dL (ref 0.3–1.2)
Total Protein: 5 g/dL — ABNORMAL LOW (ref 6.5–8.1)

## 2022-08-19 LAB — CBC
HCT: 23.3 % — ABNORMAL LOW (ref 36.0–46.0)
Hemoglobin: 7.3 g/dL — ABNORMAL LOW (ref 12.0–15.0)
MCH: 28.4 pg (ref 26.0–34.0)
MCHC: 31.3 g/dL (ref 30.0–36.0)
MCV: 90.7 fL (ref 80.0–100.0)
Platelets: 331 10*3/uL (ref 150–400)
RBC: 2.57 MIL/uL — ABNORMAL LOW (ref 3.87–5.11)
RDW: 19.4 % — ABNORMAL HIGH (ref 11.5–15.5)
WBC: 20.9 10*3/uL — ABNORMAL HIGH (ref 4.0–10.5)
nRBC: 0.9 % — ABNORMAL HIGH (ref 0.0–0.2)

## 2022-08-19 LAB — PROCALCITONIN: Procalcitonin: 0.61 ng/mL

## 2022-08-19 LAB — RESPIRATORY PANEL BY PCR

## 2022-08-19 LAB — PROTIME-INR
INR: 1.3 — ABNORMAL HIGH (ref 0.8–1.2)
Prothrombin Time: 15.9 seconds — ABNORMAL HIGH (ref 11.4–15.2)

## 2022-08-19 LAB — MRSA NEXT GEN BY PCR, NASAL: MRSA by PCR Next Gen: DETECTED — AB

## 2022-08-19 LAB — CORTISOL-AM, BLOOD: Cortisol - AM: 19.1 ug/dL (ref 6.7–22.6)

## 2022-08-19 LAB — LACTIC ACID, PLASMA: Lactic Acid, Venous: 1.7 mmol/L (ref 0.5–1.9)

## 2022-08-19 MED ORDER — CARVEDILOL 12.5 MG PO TABS
25.0000 mg | ORAL_TABLET | Freq: Two times a day (BID) | ORAL | Status: DC
  Administered 2022-08-19 – 2022-08-21 (×5): 25 mg via ORAL
  Filled 2022-08-19: qty 2
  Filled 2022-08-19 (×2): qty 1
  Filled 2022-08-19: qty 2
  Filled 2022-08-19: qty 1

## 2022-08-19 MED ORDER — TORSEMIDE 20 MG PO TABS: 10.0000 mg | ORAL_TABLET | Freq: Every day | ORAL | Status: DC | PRN

## 2022-08-19 MED ORDER — KCL-LACTATED RINGERS-D5W 20 MEQ/L IV SOLN
INTRAVENOUS | Status: DC
  Filled 2022-08-19 (×2): qty 1000

## 2022-08-19 MED ORDER — TRAMADOL HCL 50 MG PO TABS
50.0000 mg | ORAL_TABLET | Freq: Three times a day (TID) | ORAL | Status: DC | PRN
  Administered 2022-08-19 – 2022-09-04 (×9): 50 mg via ORAL
  Filled 2022-08-19 (×9): qty 1

## 2022-08-19 MED ORDER — KCL-LACTATED RINGERS-D5W 20 MEQ/L IV SOLN
INTRAVENOUS | Status: AC
  Filled 2022-08-19 (×3): qty 1000

## 2022-08-19 MED ORDER — FERROUS SULFATE 325 (65 FE) MG PO TABS
325.0000 mg | ORAL_TABLET | Freq: Two times a day (BID) | ORAL | Status: DC
  Administered 2022-08-19 – 2022-08-20 (×3): 325 mg via ORAL
  Filled 2022-08-19 (×4): qty 1

## 2022-08-19 MED ORDER — ADULT MULTIVITAMIN W/MINERALS CH
1.0000 | ORAL_TABLET | Freq: Every day | ORAL | Status: DC
  Administered 2022-08-19 – 2022-08-23 (×4): 1 via ORAL
  Filled 2022-08-19 (×4): qty 1

## 2022-08-19 MED ORDER — POLYETHYLENE GLYCOL 3350 17 G PO PACK
17.0000 g | PACK | Freq: Every morning | ORAL | Status: DC
  Administered 2022-08-19 – 2022-08-20 (×2): 17 g via ORAL
  Filled 2022-08-19 (×2): qty 1

## 2022-08-19 MED ORDER — ALBUTEROL SULFATE (2.5 MG/3ML) 0.083% IN NEBU
2.5000 mg | INHALATION_SOLUTION | Freq: Two times a day (BID) | RESPIRATORY_TRACT | Status: DC
  Administered 2022-08-19: 2.5 mg via RESPIRATORY_TRACT
  Filled 2022-08-19: qty 3

## 2022-08-19 MED ORDER — SODIUM CHLORIDE 0.9 % IV SOLN: 2.0000 g | Freq: Once | INTRAVENOUS | Status: DC

## 2022-08-19 MED ORDER — VITAMIN C 500 MG PO TABS
500.0000 mg | ORAL_TABLET | Freq: Every day | ORAL | Status: DC
  Administered 2022-08-19 – 2022-08-20 (×2): 500 mg via ORAL
  Filled 2022-08-19 (×2): qty 1

## 2022-08-19 MED ORDER — COLLAGEN EX CREA: TOPICAL_CREAM | CUTANEOUS | Status: DC

## 2022-08-19 MED ORDER — MOMETASONE FURO-FORMOTEROL FUM 200-5 MCG/ACT IN AERO
2.0000 | INHALATION_SPRAY | Freq: Two times a day (BID) | RESPIRATORY_TRACT | Status: DC
  Administered 2022-08-19: 2 via RESPIRATORY_TRACT
  Filled 2022-08-19: qty 8.8

## 2022-08-19 MED ORDER — LATANOPROST 0.005 % OP SOLN
1.0000 [drp] | Freq: Every day | OPHTHALMIC | Status: DC
  Administered 2022-08-19 – 2022-09-12 (×24): 1 [drp] via OPHTHALMIC
  Filled 2022-08-19 (×5): qty 2.5

## 2022-08-19 MED ORDER — ONDANSETRON HCL 4 MG/2ML IJ SOLN
4.0000 mg | Freq: Four times a day (QID) | INTRAMUSCULAR | Status: DC | PRN
  Administered 2022-08-24 – 2022-08-27 (×2): 4 mg via INTRAVENOUS
  Filled 2022-08-19 (×2): qty 2

## 2022-08-19 MED ORDER — ENSURE ENLIVE PO LIQD
237.0000 mL | Freq: Two times a day (BID) | ORAL | Status: DC
  Administered 2022-08-19 – 2022-08-20 (×2): 237 mL via ORAL
  Filled 2022-08-19 (×2): qty 237

## 2022-08-19 MED ORDER — PANTOPRAZOLE SODIUM 40 MG PO TBEC
40.0000 mg | DELAYED_RELEASE_TABLET | Freq: Two times a day (BID) | ORAL | Status: DC
  Administered 2022-08-19 – 2022-08-20 (×3): 40 mg via ORAL
  Filled 2022-08-19 (×4): qty 1

## 2022-08-19 MED ORDER — ONDANSETRON HCL 4 MG PO TABS: 4.0000 mg | ORAL_TABLET | Freq: Three times a day (TID) | ORAL | Status: DC | PRN

## 2022-08-19 MED ORDER — DORZOLAMIDE HCL 2 % OP SOLN
1.0000 [drp] | Freq: Two times a day (BID) | OPHTHALMIC | Status: DC
  Administered 2022-08-19 – 2022-09-13 (×50): 1 [drp] via OPHTHALMIC
  Filled 2022-08-19 (×4): qty 10

## 2022-08-19 MED ORDER — ONDANSETRON HCL 4 MG PO TABS: 4.0000 mg | ORAL_TABLET | Freq: Four times a day (QID) | ORAL | Status: DC | PRN

## 2022-08-19 MED ORDER — IPRATROPIUM-ALBUTEROL 0.5-2.5 (3) MG/3ML IN SOLN
3.0000 mL | Freq: Four times a day (QID) | RESPIRATORY_TRACT | Status: DC
  Administered 2022-08-19: 3 mL via RESPIRATORY_TRACT
  Filled 2022-08-19: qty 3

## 2022-08-19 MED ORDER — DULOXETINE HCL 20 MG PO CPEP
40.0000 mg | ORAL_CAPSULE | Freq: Every day | ORAL | Status: DC
  Administered 2022-08-19 – 2022-09-11 (×22): 40 mg via ORAL
  Filled 2022-08-19 (×24): qty 2

## 2022-08-19 MED ORDER — POTASSIUM CHLORIDE CRYS ER 10 MEQ PO TBCR
10.0000 meq | EXTENDED_RELEASE_TABLET | Freq: Two times a day (BID) | ORAL | Status: DC
  Administered 2022-08-19 – 2022-08-20 (×3): 10 meq via ORAL
  Filled 2022-08-19 (×6): qty 1

## 2022-08-19 MED ORDER — ENOXAPARIN SODIUM 40 MG/0.4ML IJ SOSY
40.0000 mg | PREFILLED_SYRINGE | INTRAMUSCULAR | Status: DC
  Administered 2022-08-19: 40 mg via SUBCUTANEOUS
  Filled 2022-08-19: qty 0.4

## 2022-08-19 MED ORDER — FESOTERODINE FUMARATE ER 4 MG PO TB24
4.0000 mg | ORAL_TABLET | Freq: Every day | ORAL | Status: DC
  Administered 2022-08-20 – 2022-09-11 (×21): 4 mg via ORAL
  Filled 2022-08-19 (×26): qty 1

## 2022-08-19 MED ORDER — LOSARTAN POTASSIUM 50 MG PO TABS
50.0000 mg | ORAL_TABLET | Freq: Every day | ORAL | Status: DC
  Administered 2022-08-19 – 2022-08-20 (×2): 50 mg via ORAL
  Filled 2022-08-19: qty 2
  Filled 2022-08-19: qty 1

## 2022-08-19 MED ORDER — ENSURE ORIGINAL PO LIQD: 1.0000 | Freq: Two times a day (BID) | ORAL | Status: DC

## 2022-08-19 MED ORDER — VANCOMYCIN HCL IN DEXTROSE 1-5 GM/200ML-% IV SOLN: 1000.0000 mg | Freq: Once | INTRAVENOUS | Status: DC

## 2022-08-19 MED ORDER — VITAMIN D (ERGOCALCIFEROL) 1.25 MG (50000 UNIT) PO CAPS
50000.0000 [IU] | ORAL_CAPSULE | ORAL | Status: DC
  Administered 2022-08-20 – 2022-09-03 (×3): 50000 [IU] via ORAL
  Filled 2022-08-19 (×3): qty 1

## 2022-08-19 NOTE — ED Notes (Signed)
ED TO INPATIENT HANDOFF REPORT  Name/Age/Gender Stephanie Benton 72 y.o. female  Code Status    Code Status Orders  (From admission, onward)           Start     Ordered   08/18/22 2145  Do not attempt resuscitation (DNR)  Continuous       Question Answer Comment  If patient has no pulse and is not breathing Do Not Attempt Resuscitation   If patient has a pulse and/or is breathing: Medical Treatment Goals MEDICAL INTERVENTIONS DESIRED: Use advanced airway interventions, mechanical ventilation or cardioversion in appropriate circumstances; Use medication/IV fluids as indicated; Provide comfort medications; Transfer to Progressive/Stepdown/ICU as indicated.   Consent: Discussion documented in EHR or advanced directives reviewed      08/18/22 2146           Code Status History     Date Active Date Inactive Code Status Order ID Comments User Context   08/13/2022 1932 08/18/2022 1704 DNR [id number]  Therisa Benton, [name], MD ED   07/20/2022 1729 07/22/2022 1925 DNR [id number]  Stephanie Benton, [name], MD Inpatient   07/16/2022 0445 07/20/2022 1729 Full Code [id number]  Stephanie Benton, [name], MD ED   07/08/2022 0840 07/13/2022 1806 Full Code [id number]  Stephanie Benton, Stephanie Manuel, MD ED   06/21/2022 1753 06/25/2022 1934 Full Code [id number]  Stephanie Benton, Stephanie Manuel, MD Inpatient   06/21/2022 1555 06/21/2022 1753 Full Code [id number]  Stephanie Benton, Stephanie Manuel, MD ED   05/17/2022 0147 05/23/2022 1715 Full Code [id number]  Therisa Benton, [name], MD ED   02/17/2018 0446 02/18/2018 1413 Full Code [id number]  Stephanie Benton, Stephanie S, MD Inpatient      Advance Directive Documentation    Flowsheet Row Most Recent Value  Type of Advance Directive Out of facility DNR (pink MOST or yellow form)  Pre-existing out of facility DNR order (yellow form or pink MOST form) --  "MOST" Form in Place? --       Home/SNF/Other Nursing Home  Chief Complaint Severe sepsis with acute organ dysfunction (HCC) [A41.9, R65.20]  Level of  Care/Admitting Diagnosis ED Disposition     ED Disposition  Admit   Condition  --   Comment  [facility] [[id number]]  Level of Care: Progressive [102]  Admit to Progressive based on following criteria: RESPIRATORY PROBLEMS hypoxemic/hypercapnic respiratory failure that is responsive to NIPPV (BiPAP) or High Flow Nasal Cannula (6-80 lpm). Frequent assessment/intervention, no > Q2 hrs < Q4 hrs, to maintain oxygenation and pulmonary hygiene.  [facility] or [facility] if equivalent level of care is available:: Yes  Covid Evaluation: Recent COVID positive no isolation required infection day 21-90  Diagnosis: Severe sepsis with acute organ dysfunction [facility](HCC) [[id number]][[id number]]  Admitting Physician: Stephanie Benton, Stephanie Benton [[id number]]  Attending Physician: Stephanie Benton, Stephanie Benton [[id number]]  Certification:: I certify this patient will need inpatient services for at least 2 midnights  Estimated Length of Stay: 5          Medical History Past Medical History:  Diagnosis Date   Hyperlipidemia    Hypertension    Prolonged QT interval 06/21/2022    Allergies Allergies  Allergen Reactions   Fish Allergy Anaphylaxis   Morphine And Related Anaphylaxis and Swelling   Belsomra [Suvorexant] Other (See Comments)    Nightmares     Codeine Nausea And Vomiting   Duragesic-100 [Fentanyl] Hives   Motrin [Ibuprofen] Nausea Only and Other (See Comments)    History of stomach ulcers   Nsaids Other (See  Comments)    History of stomach ulcers   Penicillins Hives    Did it involve swelling of the face/tongue/throat, SOB, or low BP? Yes Did it involve sudden or severe rash/hives, skin peeling, or any reaction on the inside of your mouth or nose? Yes Did you need to seek medical attention at a hospital or doctor'Benton office? Yes When did it last happen? More than 10 years ago If all above answers are "NO", may proceed with cephalosporin use.    Sulfa Antibiotics Swelling     Not listed on MAR   Tolectin [Tolmetin] Other (See Comments)    History of stomach ulcers Not listed on MAR    IV Location/Drains/Wounds Patient Lines/Drains/Airways Status     Active Line/Drains/Airways     Name Placement date Placement time Site Days   Peripheral IV 08/18/22 18 G 2.5" Right;Upper Arm 08/18/22  1952  Arm  1   Wound / Incision (Open or Dehisced) 07/17/22 Non-pressure wound Knee Anterior;Right abrasion 07/17/22  0522  Knee  33   Wound / Incision (Open or Dehisced) 08/14/22 Irritant Dermatitis (Moisture Associated Skin Damage) Buttocks Medial 08/14/22  1505  Buttocks  5   Wound / Incision (Open or Dehisced) 08/14/22 Irritant Dermatitis (Moisture Associated Skin Damage) Coccyx Medial 08/14/22  1505  Coccyx  5            Labs/Imaging Results for orders placed or performed during the hospital encounter of 08/18/22 (from the past 48 hour(Benton))  CBG monitoring, ED     Status: None   Collection Time: 08/18/22  5:50 PM  Result Value Ref Range   Glucose-Capillary 84 70 - 99 mg/dL    Comment: Glucose reference range applies only to samples taken after fasting for at least 8 hours.  Comprehensive metabolic panel     Status: Abnormal   Collection Time: 08/18/22  8:25 PM  Result Value Ref Range   Sodium 140 135 - 145 mmol/Benton   Potassium 3.0 (Benton) 3.5 - 5.1 mmol/Benton   Chloride 115 (H) 98 - 111 mmol/Benton   CO2 17 (Benton) 22 - 32 mmol/Benton   Glucose, Bld 97 70 - 99 mg/dL    Comment: Glucose reference range applies only to samples taken after fasting for at least 8 hours.   BUN 16 8 - 23 mg/dL   Creatinine, Ser 2.54 0.44 - 1.00 mg/dL   Calcium 8.0 (Benton) 8.9 - 10.3 mg/dL   Total Protein 5.8 (Benton) 6.5 - 8.1 g/dL   Albumin 2.2 (Benton) 3.5 - 5.0 g/dL   AST 25 15 - 41 U/Benton   ALT 16 0 - 44 U/Benton   Alkaline Phosphatase 91 38 - 126 U/Benton   Total Bilirubin 0.7 0.3 - 1.2 mg/dL   GFR, Estimated >27 >06 mL/min    Comment: (NOTE) Calculated using the CKD-EPI Creatinine Equation (2021)    Anion gap 8 5 - 15     Comment: Performed at [facility], [address] [place], [place] [address]  Lactic acid, plasma     Status: Abnormal   Collection Time: 08/18/22  8:25 PM  Result Value Ref Range   Lactic Acid, Venous 2.4 (HH) 0.5 - 1.9 mmol/Benton    Comment: CRITICAL RESULT CALLED TO, READ BACK BY AND VERIFIED WITH CALL ATTEMPTED @2110  LYNCH,J. 08/18/22 @2118  BY SEEL,M. Performed at [facility], [address] [place], [place]   CBC with Differential     Status: Abnormal   Collection Time:  08/18/22  8:25 PM  Result Value Ref Range   WBC 20.1 (H) 4.0 - 10.5 K/uL   RBC 2.79 (Benton) 3.87 - 5.11 MIL/uL   Hemoglobin 7.7 (Benton) 12.0 - 15.0 g/dL   HCT 24.9 (Benton) 36.0 - 46.0 %   MCV 89.2 80.0 - 100.0 fL   MCH 27.6 26.0 - 34.0 pg   MCHC 30.9 30.0 - 36.0 g/dL   RDW 19.1 (H) 11.5 - 15.5 %   Platelets 376 150 - 400 K/uL   nRBC 0.7 (H) 0.0 - 0.2 %   Neutrophils Relative % 77 %   Neutro Abs 15.5 (H) 1.7 - 7.7 K/uL   Lymphocytes Relative 13 %   Lymphs Abs 2.6 0.7 - 4.0 K/uL   Monocytes Relative 7 %   Monocytes Absolute 1.3 (H) 0.1 - 1.0 K/uL   Eosinophils Relative 0 %   Eosinophils Absolute 0.1 0.0 - 0.5 K/uL   Basophils Relative 0 %   Basophils Absolute 0.1 0.0 - 0.1 K/uL   Immature Granulocytes 3 %   Abs Immature Granulocytes 0.55 (H) 0.00 - 0.07 K/uL    Comment: Performed at [facility], [place] [address], [place], [place] [address]  Protime-INR     Status: Abnormal   Collection Time: 08/18/22  8:25 PM  Result Value Ref Range   Prothrombin Time 15.3 (H) 11.4 - 15.2 seconds   INR 1.2 0.8 - 1.2    Comment: (NOTE) INR goal varies based on device and disease states. Performed at [facility], [place] [address] [place], [place] [address]   Culture, blood (Routine x 2)     Status: None (Preliminary result)   Collection Time: 08/18/22  8:25 PM   Specimen: BLOOD  Result Value Ref Range   Specimen Description      BLOOD BLOOD  RIGHT ARM Performed at [facility] [address] [place], [place] [address]    Special Requests      BOTTLES DRAWN AEROBIC AND ANAEROBIC Blood Culture adequate volume Performed at [facility] [address], [place], [place] [address]    Culture      NO GROWTH < 12 HOURS Performed at [facility] [address], [place], [place] [address]    Report Status PENDING   Blood gas, venous     Status: Abnormal   Collection Time: 08/18/22  9:28 PM  Result Value Ref Range   pH, Ven 7.44 (H) 7.25 - 7.43   pCO2, Ven 30 (Benton) 44 - 60 mmHg   pO2, Ven 31 (LL) 32 - 45 mmHg    Comment: CRITICAL RESULT CALLED TO, READ BACK BY AND VERIFIED WITH: BELCHER,J. 08/18/22 @2149  BY SEEL,M.    Bicarbonate 20.4 20.0 - 28.0 mmol/Benton   Acid-base deficit 2.7 (H) 0.0 - 2.0 mmol/Benton   O2 Saturation 46.3 %   Patient temperature 37.1     Comment: Performed at [facility], [place] [address] [place], [place] [address]  Lactic acid, plasma     Status: None   Collection Time: 08/19/22 12:30 AM  Result Value Ref Range   Lactic Acid, Venous 1.7 0.5 - 1.9 mmol/Benton    Comment: Performed at [facility], [place] [address] [place], [place] [address]  CBC     Status: Abnormal   Collection Time: 08/19/22  5:00 AM  Result Value Ref Range   WBC 20.9 (H) 4.0 - 10.5 K/uL   RBC 2.57 (Benton)  3.87 - 5.11 MIL/uL   Hemoglobin 7.3 (Benton) 12.0 - 15.0 g/dL   HCT 41.7 (Benton) 40.8 - 14.4 %   MCV 90.7 80.0 - 100.0 fL   MCH 28.4 26.0 - 34.0 pg   MCHC 31.3 30.0 - 36.0 g/dL   RDW 81.8 (H) 56.3 - 14.9 %   Platelets 331 150 - 400 K/uL   nRBC 0.9 (H) 0.0 - 0.2 %    Comment: Performed at [facility], [address], [place], [place] [address]  Comprehensive metabolic panel     Status: Abnormal   Collection Time: 08/19/22  5:00 AM  Result Value Ref Range   Sodium 144 135 - 145 mmol/Benton   Potassium 3.1 (Benton) 3.5 - 5.1 mmol/Benton   Chloride 116 (H) 98 - 111  mmol/Benton   CO2 19 (Benton) 22 - 32 mmol/Benton   Glucose, Bld 90 70 - 99 mg/dL    Comment: Glucose reference range applies only to samples taken after fasting for at least 8 hours.   BUN 15 8 - 23 mg/dL   Creatinine, Ser 7.85 0.44 - 1.00 mg/dL   Calcium 8.0 (Benton) 8.9 - 10.3 mg/dL   Total Protein 5.0 (Benton) 6.5 - 8.1 g/dL   Albumin 2.0 (Benton) 3.5 - 5.0 g/dL   AST 20 15 - 41 U/Benton   ALT 14 0 - 44 U/Benton   Alkaline Phosphatase 77 38 - 126 U/Benton   Total Bilirubin 0.9 0.3 - 1.2 mg/dL   GFR, Estimated >88 >50 mL/min    Comment: (NOTE) Calculated using the CKD-EPI Creatinine Equation (2021)    Anion gap 9 5 - 15    Comment: Performed at [facility], [address] [place], [place] [address]  Protime-INR     Status: Abnormal   Collection Time: 08/19/22  5:00 AM  Result Value Ref Range   Prothrombin Time 15.9 (H) 11.4 - 15.2 seconds   INR 1.3 (H) 0.8 - 1.2    Comment: (NOTE) INR goal varies based on device and disease states. Performed at [facility], [address], [place], [place] [address]   Procalcitonin     Status: None   Collection Time: 08/19/22  5:00 AM  Result Value Ref Range   Procalcitonin 0.61 ng/mL    Comment:        Interpretation: PCT > 0.5 ng/mL and <= 2 ng/mL: Systemic infection (sepsis) is possible, but other conditions are known to elevate PCT as well. (NOTE)       Sepsis PCT Algorithm           Lower Respiratory Tract                                      Infection PCT Algorithm    ----------------------------     ----------------------------         PCT < 0.25 ng/mL                PCT < 0.10 ng/mL          Strongly encourage             Strongly discourage   discontinuation of antibiotics    initiation of antibiotics    ----------------------------     -----------------------------       PCT 0.25 - 0.50 ng/mL            PCT 0.10 -  0.25 ng/mL               OR       >80% decrease in PCT            Discourage initiation of                                             antibiotics      Encourage discontinuation           of antibiotics    ----------------------------     -----------------------------         PCT >= 0.50 ng/mL              PCT 0.26 - 0.50 ng/mL                AND       <80% decrease in PCT             Encourage initiation of                                             antibiotics       Encourage continuation           of antibiotics    ----------------------------     -----------------------------        PCT >= 0.50 ng/mL                  PCT > 0.50 ng/mL               AND         increase in PCT                  Strongly encourage                                      initiation of antibiotics    Strongly encourage escalation           of antibiotics                                     -----------------------------                                           PCT <= 0.25 ng/mL                                                 OR                                        > 80% decrease in PCT  Discontinue / Do not initiate                                             antibiotics  Performed at [facility] [address] [place], [place] [address]   Cortisol-am, blood     Status: None   Collection Time: 08/19/22  5:30 AM  Result Value Ref Range   Cortisol - AM 19.1 6.7 - 22.6 ug/dL    Comment: Performed at [facility], [place] [address], [place], [place] [address]  POC occult blood, ED     Status: Abnormal   Collection Time: 08/19/22  7:19 AM  Result Value Ref Range   Fecal Occult Bld POSITIVE (A) NEGATIVE   DG CHEST PORT 1 VIEW  Result Date: 08/19/2022 CLINICAL DATA:  Collapse of left lung. EXAM: PORTABLE CHEST 1 VIEW COMPARISON:  Chest x-ray 08/19/2022. FINDINGS: Unchanged near complete opacification left hemithorax with volume loss. Right lung is clear. No visible pneumothorax. Polyarticular degenerative change. Cardiomediastinal silhouette is largely  obscured. IMPRESSION: Unchanged near complete opacification left hemithorax with volume loss, further described on chest x-ray from yesterday. Recommend imaging follow-up to resolution. Electronically Signed   By: Margaretha Sheffield M.D.   On: 08/19/2022 08:25   DG Chest 2 View  Result Date: 08/18/2022 CLINICAL DATA:  Dyspnea, altered mental status, hypoxia, suspected sepsis EXAM: CHEST - 2 VIEW COMPARISON:  08/13/2022 chest radiograph. FINDINGS: Left rotated chest radiograph. New complete opacification of the left hemithorax with left mediastinal shift and volume loss in the left hemithorax. No pneumothorax. Clear right lung. No right pleural effusion. IMPRESSION: New complete opacification of the left hemithorax with left mediastinal shift and volume loss in the left hemithorax. Given the rapid change since 08/13/2022 chest radiograph, findings most likely represent mucous plugging in the central left lung airways with complete left lung atelectasis, with underlying pneumonia not excluded. Chest radiograph follow-up advised at minimum. Electronically Signed   By: Ilona Sorrel M.D.   On: 08/18/2022 18:45    Pending Labs Unresulted Labs (From admission, onward)     Start     Ordered   08/25/22 0500  Creatinine, serum  (enoxaparin (LOVENOX)    CrCl >/= 30 ml/min)  Weekly,   R     Comments: while on enoxaparin therapy    08/19/22 0456   08/20/22 0923  Basic metabolic panel  Tomorrow morning,   R        08/19/22 1254   08/20/22 0500  CBC  Tomorrow morning,   R        08/19/22 1254   08/20/22 0500  Magnesium  Tomorrow morning,   R        08/19/22 1254   08/19/22 1602  Respiratory (~20 pathogens) panel by PCR  (Respiratory panel by PCR (~20 pathogens, ~24 hr TAT)  w precautions)  Once,   R        08/19/22 1601   08/19/22 1601  Expectorated Sputum Assessment w Gram Stain, Rflx to Resp Cult  Once,   R        08/19/22 1600   08/19/22 1209  MRSA Next Gen by PCR, Nasal  (MRSA Screening)  Once,   R         08/19/22 1208   08/18/22 1759  Culture, blood (Routine x 2)  BLOOD CULTURE X 2,  R      08/18/22 1759   08/18/22 1759  Urinalysis, Routine w reflex microscopic  Once,   URGENT        08/18/22 1759            Vitals/Pain Today'Benton Vitals   08/19/22 1346 08/19/22 1400 08/19/22 1422 08/19/22 1430  BP:  (!) 143/89  (!) 142/77  Pulse:  (!) 107  (!) 106  Resp:  (!) 21  19  Temp: 97.6 F (36.4 C)  98.5 F (36.9 C)   TempSrc: Oral  Oral   SpO2:  98%  98%  Weight:      Height:      PainSc:        Isolation Precautions Droplet precaution  Medications Medications  potassium chloride SA (KLOR-CON M) CR tablet 40 mEq (40 mEq Oral Not Given 08/18/22 2300)  sodium chloride HYPERTONIC 3 % nebulizer solution 4 mL (4 mLs Nebulization Given 08/19/22 0947)  polyethylene glycol (MIRALAX / GLYCOLAX) packet 17 g (17 g Oral Given 08/19/22 1020)  ascorbic acid (VITAMIN C) tablet 500 mg (500 mg Oral Given 08/19/22 1020)  Vitamin D (Ergocalciferol) (DRISDOL) 1.25 MG (50000 UNIT) capsule 50,000 Units (has no administration in time range)  DULoxetine (CYMBALTA) DR capsule 40 mg (40 mg Oral Given 08/19/22 1134)  latanoprost (XALATAN) 0.005 % ophthalmic solution 1 drop (has no administration in time range)  multivitamin with minerals tablet 1 tablet (1 tablet Oral Given 08/19/22 1020)  dorzolamide (TRUSOPT) 2 % ophthalmic solution 1 drop (has no administration in time range)  ferrous sulfate tablet 325 mg (325 mg Oral Given 08/19/22 1020)  pantoprazole (PROTONIX) EC tablet 40 mg (40 mg Oral Given 08/19/22 1020)  potassium chloride (KLOR-CON) CR tablet 10 mEq (10 mEq Oral Given 08/19/22 1020)  ondansetron (ZOFRAN) tablet 4 mg (has no administration in time range)  traMADol (ULTRAM) tablet 50 mg (50 mg Oral Given 08/19/22 0739)  carvedilol (COREG) tablet 25 mg (25 mg Oral Given 08/19/22 0813)  losartan (COZAAR) tablet 50 mg (50 mg Oral Given 08/19/22 1134)  fesoterodine (TOVIAZ) tablet 4 mg (has no  administration in time range)  enoxaparin (LOVENOX) injection 40 mg (40 mg Subcutaneous Not Given 08/19/22 1018)  ondansetron (ZOFRAN) tablet 4 mg (has no administration in time range)    Or  ondansetron (ZOFRAN) injection 4 mg (has no administration in time range)  ceFEPIme (MAXIPIME) 2 g in sodium chloride 0.9 % 100 mL IVPB (2 g Intravenous New Bag/Given 08/19/22 1019)  vancomycin (VANCOREADY) IVPB 1250 mg/250 mL (has no administration in time range)  feeding supplement (ENSURE ENLIVE / ENSURE PLUS) liquid 237 mL (237 mLs Oral Given 08/19/22 1046)  dextrose 5% in lactated ringers with KCl 20 mEq/Benton infusion ( Intravenous New Bag/Given 08/19/22 1328)  ipratropium-albuterol (DUONEB) 0.5-2.5 (3) MG/3ML nebulizer solution 3 mL (has no administration in time range)  ceFEPIme (MAXIPIME) 2 g in sodium chloride 0.9 % 100 mL IVPB (0 g Intravenous Stopped 08/18/22 2210)  lactated ringers bolus 1,000 mL (0 mLs Intravenous Stopped 08/18/22 2338)    And  lactated ringers bolus 1,000 mL (0 mLs Intravenous Stopped 08/18/22 2213)    And  lactated ringers bolus 250 mL (0 mLs Intravenous Stopped 08/19/22 0033)  vancomycin (VANCOREADY) IVPB 1500 mg/300 mL (0 mg Intravenous Stopped 08/19/22 0033)    Mobility non-ambulatory

## 2022-08-19 NOTE — Consult Note (Signed)
NAME:  LI BOBO, MRN:  [id number], DOB:  1951/04/14, LOS: 1 ADMISSION DATE:  08/18/2022, CONSULTATION DATE:  08/19/22 REFERRING MD:  Tyson Babinski, CHIEF COMPLAINT:  pneumonia  History of Present Illness:  Nathalia Wismer is a 72 y.o. F with PMH significant for COPD, Bipolar Disorder, HTN, HL, pancreatitis who presented from SNF with increased dyspnea.  She was recently admitted with pneumonia and Covid-19 from 1/9-1/13, she was initially treated with aztreonam, azithromycin and vanc and discharged on omnicef and was on room air .   She returned to the ED on 1/14 with worsening dyspnea and CXR showed opacification of the L hemithorax suggestive of mucous plugging.  She required 2-3L oxygen and labs were significant for WBC of 20k, Hgb 7.3, lactic acid 2.4, procalcitonin 0.61.   She was admitted and started on Vancomycin and Cefepime along with hypertonic saline nebs, IS and chest PT.  A repeat CXR this morning was unchanged, so PCCM consulted.  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   Significant Hospital Events: Including procedures, antibiotic start and stop dates in addition to other pertinent events   1/14 presented with L hemithorax opacification and dyspnea, PCCM consult  Interim History / Subjective:  As above   Objective   Blood pressure (!) 140/101, pulse (!) 111, temperature 99 F (37.2 C), temperature source Oral, resp. rate (!) 23, height 5\' 5"  (1.651 m), weight 73.9 kg, SpO2 95 %.       No intake or output data in the 24 hours ending 08/19/22 1309 Filed Weights   08/18/22 1753  Weight: 73.9 kg   General:  thin, elderly F resting in no acute distress HEENT: MM pink/moist, sclera anicteric Neuro: awake, alert, oriented to person only, thinks it is 1965 CV: s1s2 rrr, no m/r/g PULM:  minimal air entry L-side, no crackles or wheezing, no accessory  muscle use or tachypnea on 2L [place], no obvious respiratory secretions GI: soft, non-distended  Extremities: warm/dry, no edema  Skin: no rashes or lesions   Resolved Hospital Problem list     Assessment & Plan:   L hemithorax opacification and possible infiltrate resulting in acute hypoxic respiratory failure Discharged 1/13 for CAP and Covid on Omnicef -most likely secondary to mucous plugging, though CXR not improved today would be most beneficial if pt can bring up these secretions without bronchoscopy, continue hypertonic saline nebs, aggressive chest PT, IS and flutter valve as she is able -continue broad spectrum abx -strep pneumo and legionella were negative 5 days ago, covid + -send extended RVP and sputum culture -blood cultures pending  -bedside 2/13 to evaluate for possible effusion -thank you for this consult, we will continue to follow with you      Best Practice (right click and "Reselect all SmartList Selections" daily)   Per primary  Labs   CBC: Recent Labs  Lab 08/13/22 1440 08/14/22 0454 08/15/22 0800 08/16/22 0540 08/18/22 2025 08/19/22 0500  WBC 24.6* 23.2* 15.9* 11.8* 20.1* 20.9*  NEUTROABS 21.6* 19.8* 13.1* 9.0* 15.5*  --   HGB 8.5* 7.5* 7.7* 7.4* 7.7* 7.3*  HCT 24.7* 21.6* 23.5* 22.4* 24.9* 23.3*  MCV 92.9 92.7 89.4 87.5 89.2 90.7  PLT 334 300 346 308 376 331    Basic Metabolic Panel: Recent Labs  Lab 08/13/22 2004 08/14/22 0454 08/15/22 0800 08/16/22 0540 08/17/22 0141 08/18/22 2025 08/19/22 0500  NA  --  142 145 144 143 140 144  K  --  3.2* 3.5 3.4* 3.6 3.0* 3.1*  CL  --  113* 118* 117* 116* 115* 116*  CO2  --  20* 20* 20* 20* 17* 19*  GLUCOSE  --  107* 81 83 87 97 90  BUN  --  14 10 <5* 5* 16 15  CREATININE  --  1.07* 0.94 0.86 0.76 0.91 0.88  CALCIUM  --  7.8* 8.2* 8.0* 8.3* 8.0* 8.0*  MG 1.9 1.9 1.9 1.8  --   --   --   PHOS 3.6 2.9 2.7 2.5  --   --   --    GFR: Estimated Creatinine Clearance: 58.2 mL/min (by C-G formula  based on SCr of 0.88 mg/dL). Recent Labs  Lab 08/13/22 2004 08/14/22 0120 08/14/22 0453 08/14/22 0454 08/15/22 0800 08/16/22 0540 08/18/22 2025 08/19/22 0030 08/19/22 0500  PROCALCITON 2.85  --   --  3.36 2.46  --   --   --  0.61  WBC  --   --   --  23.2* 15.9* 11.8* 20.1*  --  20.9*  LATICACIDVEN 1.0 0.9 1.4  --   --   --  2.4* 1.7  --     Liver Function Tests: Recent Labs  Lab 08/14/22 0454 08/15/22 0800 08/16/22 0540 08/18/22 2025 08/19/22 0500  AST 19 16 17 25 20   ALT 14 14 13 16 14   ALKPHOS 99 109 102 91 77  BILITOT 0.8 0.8 0.9 0.7 0.9  PROT 5.5* 5.3* 5.0* 5.8* 5.0*  ALBUMIN 2.2* 2.0* 1.9* 2.2* 2.0*   Recent Labs  Lab 08/13/22 2004  LIPASE 46   No results for input(s): "AMMONIA" in the last 168 hours.  ABG    Component Value Date/Time   HCO3 20.4 08/18/2022 2128   TCO2 23 08/13/2022 1417   ACIDBASEDEF 2.7 (H) 08/18/2022 2128   O2SAT 46.3 08/18/2022 2128     Coagulation Profile: Recent Labs  Lab 08/13/22 1440 08/18/22 2025 08/19/22 0500  INR 1.4* 1.2 1.3*    Cardiac Enzymes: Recent Labs  Lab 08/13/22 1814  CKTOTAL 29*    HbA1C: No results found for: "HGBA1C"  CBG: Recent Labs  Lab 08/15/22 0758 08/15/22 1118 08/15/22 1743 08/15/22 2138 08/18/22 1750  GLUCAP 92 93 95 99 84    Review of Systems:   Unable to obtain secondary to disorientation  Past Medical History:  She,  has a past medical history of Hyperlipidemia, Hypertension, and Prolonged QT interval (06/21/2022).   Surgical History:   Past Surgical History:  Procedure Laterality Date   ABDOMINAL HYSTERECTOMY     APPENDECTOMY     BIOPSY  05/17/2022   Procedure: BIOPSY;  Surgeon: Carol Ada, MD;  Location: [facility] END[facility]COPY;  Service: Gastroenterology;;   CHOLECYSTECTOMY N/A 05/22/2022   Procedure: LAPAROSCOPIC CHOLECYSTECTOMY WITH INTRAOPERATIVE CHOLANGIOGRAM;  Surgeon: Coralie Keens, MD;  Location: [facility];  Service: General;  Laterality: N/A;    ESOPHAGOGASTRODUODENOSCOPY (EGD) WITH PROPOFOL N/A 05/17/2022   Procedure: ESOPHAGOGASTRODUODENOSCOPY (EGD) WITH PROPOFOL;  Surgeon: Carol Ada, MD;  Location: [facility];  Service: Gastroenterology;  Laterality: N/A;   ESOPHAGOGASTRODUODENOSCOPY (EGD) WITH PROPOFOL N/A 07/12/2022   Procedure: ESOPHAGOGASTRODUODENOSCOPY (EGD) WITH PROPOFOL;  Surgeon: Carol Ada, MD;  Location: [facility] ENDOSCOPY;  Service: Gastroenterology;  Laterality: N/A;   HERNIA REPAIR     TONSILLECTOMY     TUBAL LIGATION       Social History:   reports that she has been smoking cigarettes. She has been  smoking an average of .75 packs per day. She has never used smokeless tobacco. She reports that she does not drink alcohol and does not use drugs.   Family History:  Her family history includes Cancer in her father and sister; Heart failure in her mother.   Allergies Allergies  Allergen Reactions   Fish Allergy Anaphylaxis   Morphine And Related Anaphylaxis and Swelling   Belsomra [Suvorexant] Other (See Comments)    Nightmares     Codeine Nausea And Vomiting   Duragesic-100 [Fentanyl] Hives   Motrin [Ibuprofen] Nausea Only and Other (See Comments)    History of stomach ulcers   Nsaids Other (See Comments)    History of stomach ulcers   Penicillins Hives    Did it involve swelling of the face/tongue/throat, SOB, or low BP? Yes Did it involve sudden or severe rash/hives, skin peeling, or any reaction on the inside of your mouth or nose? Yes Did you need to seek medical attention at a hospital or doctor's office? Yes When did it last happen? More than 10 years ago If all above answers are "NO", may proceed with cephalosporin use.    Sulfa Antibiotics Swelling    Not listed on MAR   Tolectin [Tolmetin] Other (See Comments)    History of stomach ulcers Not listed on MAR     Home Medications  Prior to Admission medications   Medication Sig Start Date End Date Taking? Authorizing Provider  albuterol  (PROVENTIL) (2.5 MG/3ML) 0.083% nebulizer solution Take 2.5 mg by nebulization every 6 (six) hours as needed for wheezing or shortness of breath.   Yes [provider]  ascorbic acid (VITAMIN C) 500 MG tablet Take 1 tablet (500 mg total) by mouth daily. Patient taking differently: Take 500 mg by mouth in the morning. (1000) 07/23/22  Yes Dahal, Marlowe Aschoff, MD  dorzolamide (TRUSOPT) 2 % ophthalmic solution Place 1 drop into both eyes 2 (two) times daily. (1000, 1800)   Yes [provider]  DULoxetine HCl 40 MG CSDR Take 40 mg by mouth in the morning. (1000)   Yes [provider]  ferrous sulfate 325 (65 FE) MG tablet Take 325 mg by mouth 2 (two) times daily. (1000, 1800)   Yes [provider]  folic acid (FOLVITE) 1 MG tablet Take 1 mg by mouth daily.   Yes [provider]  latanoprost (XALATAN) 0.005 % ophthalmic solution Place 1 drop into both eyes at bedtime. (2100)   Yes [provider]  losartan (COZAAR) 100 MG tablet Take 0.5 tablets (50 mg total) by mouth daily. (1000) 08/17/22  Yes Charlynne Cousins, MD  Multiple Vitamins-Minerals (MULTIVITAMIN WITH MINERALS) tablet Take 1 tablet by mouth in the morning. (1000)   Yes [provider]  Nutritional Supplements (ENSURE ORIGINAL) LIQD Take 1 each by mouth 2 (two) times daily. (1000, 1800)   Yes [provider]  ondansetron (ZOFRAN) 4 MG tablet Take 4 mg by mouth every 8 (eight) hours as needed for nausea or vomiting.   Yes [provider]  pantoprazole (PROTONIX) 40 MG tablet Take 40 mg by mouth 2 (two) times daily. (0600, 1600)   Yes [provider]  polyethylene glycol powder (GLYCOLAX/MIRALAX) 17 GM/SCOOP powder Take 1 cap full (17 g) with water by mouth daily. Patient taking differently: Take 17 g by mouth in the morning. (1000) 05/24/22  Yes Elodia Florence., MD  potassium chloride (KLOR-CON) 10 MEQ tablet Take 20 mEq by mouth 2 (two) times  daily.  (0900, 1800)   Yes [provider]  tolterodine (DETROL) 2 MG tablet Take 2 mg by mouth in the morning. (1000)   Yes [provider]  torsemide (DEMADEX) 10 MG tablet Take 10 mg by mouth daily as needed (edema).   Yes [provider]  traMADol (ULTRAM) 50 MG tablet Take 50 mg by mouth every 8 (eight) hours as needed for severe pain or moderate pain.   Yes [provider]  Vitamin D, Ergocalciferol, (DRISDOL) 1.25 MG (50000 UNIT) CAPS capsule Take 1 capsule (50,000 Units total) by mouth every 7 (seven) days. Patient taking differently: Take 50,000 Units by mouth See admin instructions. 50,000 units once a week on Tuesday (1000) 07/24/22  Yes Dahal, Melina Schools, MD  budesonide-formoterol (SYMBICORT) 160-4.5 MCG/ACT inhaler Inhale 2 puffs into the lungs every 12 (twelve) hours as needed (wheezing).    [provider]  carvedilol (COREG) 25 MG tablet Take 1 tablet (25 mg total) by mouth 2 (two) times daily with a meal. 08/17/22   Marinda Elk, MD  cefdinir (OMNICEF) 300 MG capsule Take 1 capsule (300 mg total) by mouth every 12 (twelve) hours for 2 days. 08/17/22 08/19/22  Marinda Elk, MD  Emollient (COLLAGEN EX) Apply 1 application  topically See admin instructions. Cleanse right knee, pat dry, apply collagen and cover with silicone dressing every Monday, Wednesday, Friday during day shift.    [provider]     Critical care time: n/a     Darcella Gasman Sirinity Outland, PA-C [facility] See Amion for pager If no response to pager , please call [phone] until 7pm After 7:00 pm call Elink  [phone]

## 2022-08-19 NOTE — Progress Notes (Signed)
PROGRESS NOTE    Stephanie Benton  MGQ:[id number] DOB: 09/21/1950 DOA: 08/18/2022 PCP: Sharmon Revere, MD    Brief Narrative:  Stephanie Benton is a 72 y.o. female with past medical history of hypertension, hyperlipidemia, bipolar disorder, AKI, chronic abdominal pain, chronic pancreatitis, COPD who was discharged to skilled nursing facility on 08/18/2022 after being admitted for severe sepsis from community-acquired pneumonia and COVID-19 infection diagnosed on January 9.  Patient was brought back secondary to worsening shortness of breath and hypoxia.  Chest x-ray in the ER showed left-sided whiteout consistent with atelectasis.  This was most likely mucous plugging.  There is was evidence of volume loss with tracheal deviation to the left.    Patient was initially tachycardic with leukocytosis and pulse ox was 91% on 3 L of oxygen. Patient was then readmitted to hospital for further evaluation and treatment.  Assessment and plan.  Severe evere sepsis due to pneumonia: Patient has been started on IV vancomycin and cefepime for healthcare associated pneumonia.    On IV fluids.  Has had significant leukocytosis.  Temperature max of 99.3 F.  On 2 L of oxygen by nasal cannula.  Follow MRSA screen, blood cultures negative in less than 12 hours.   Left lung mucous plugging with lung collapse, atelectasis.  Pulmonary was consulted and recommendations include hypertonic saline with incentive spirometry and flutter valve with manual chest physiotherapy.  Pulmonary advises against bronchoscopy at this time.   Repeat chest x-ray from today without any change.   Pulmonary to follow.  Recent COVID-19 infection: Diagnosed on August 13, 2022.  Day 7 today.  Continue airborne precautions.   Acute kidney injury: Creatinine at 0.8.  Improved.  Check BMP in AM.   Hypokalemia: Will replenish.  Potassium today at 3.1.  On oral potassium supplements.  Hold diuretics today.   Normocytic anemia: Hemoglobin is 7.3.   Will continue to monitor.   Bipolar disorder: In full remission.  Continue to be as.   Tobacco abuse: Continue nicotine patch.      DVT prophylaxis: enoxaparin (LOVENOX) injection 40 mg Start: 08/19/22 1000   Code Status:     Code Status: DNR  Disposition: Skilled nursing facility likely in 2 to 3 days  Status is: Inpatient  Remains inpatient appropriate because: Healthcare-associated pneumonia, left hemithorax opacification and collapse.   Family Communication: Spoke with the patient's son at bedside  Consultants:  Pulmonary  Procedures:  Chest therapy  Antimicrobials:  Vancomycin and cefepime  Anti-infectives (From admission, onward)    Start     Dose/Rate Route Frequency Ordered Stop   08/19/22 2200  vancomycin (VANCOREADY) IVPB 1250 mg/250 mL        1,250 mg 166.7 mL/hr over 90 Minutes Intravenous Every 24 hours 08/18/22 2230     08/19/22 1000  ceFEPIme (MAXIPIME) 2 g in sodium chloride 0.9 % 100 mL IVPB        2 g 200 mL/hr over 30 Minutes Intravenous Every 12 hours 08/18/22 2230     08/19/22 0500  vancomycin (VANCOCIN) IVPB 1000 mg/200 mL premix  Status:  Discontinued        1,000 mg 200 mL/hr over 60 Minutes Intravenous  Once 08/19/22 0456 08/19/22 0457   08/19/22 0500  ceFEPIme (MAXIPIME) 2 g in sodium chloride 0.9 % 100 mL IVPB  Status:  Discontinued        2 g 200 mL/hr over 30 Minutes Intravenous  Once 08/19/22 0456 08/19/22 0458   08/18/22 2115  vancomycin (  VANCOREADY) IVPB 1500 mg/300 mL        1,500 mg 150 mL/hr over 120 Minutes Intravenous  Once 08/18/22 2106 08/19/22 0033   08/18/22 2045  vancomycin (VANCOCIN) IVPB 1000 mg/200 mL premix  Status:  Discontinued        1,000 mg 200 mL/hr over 60 Minutes Intravenous  Once 08/18/22 2044 08/18/22 2105   08/18/22 2045  ceFEPIme (MAXIPIME) 2 g in sodium chloride 0.9 % 100 mL IVPB        2 g 200 mL/hr over 30 Minutes Intravenous  Once 08/18/22 2044 08/18/22 2210      Subjective: Today, patient was seen  and examined at bedside.  Patient denies chest pain dyspnea but appears short of breath and tachypneic.  Denies any nausea vomiting fever or chills.  Has had some cough.  Patient's son at bedside.  Objective: Vitals:   08/19/22 1100 08/19/22 1142 08/19/22 1143 08/19/22 1200  BP: (!) 142/75   (!) 140/101  Pulse: (!) 113   (!) 111  Resp: 19   (!) 23  Temp:   99 F (37.2 C)   TempSrc:   Oral   SpO2: 93% 93%  95%  Weight:      Height:       No intake or output data in the 24 hours ending 08/19/22 1247 Filed Weights   08/18/22 1753  Weight: 73.9 kg    Physical Examination: Body mass index is 27.11 kg/m.   General:  Average built, not in obvious distress, appears tachypneic, on nasal cannula oxygen, appears chronically ill weak and deconditioned. HENT:   No scleral pallor or icterus noted. Oral mucosa is moist.  Chest: Diminished breath sounds mostly on the left side.   CVS: S1 &S2 heard. No murmur.  Regular rate and rhythm.  Tachycardic, Abdomen: Soft, nontender, nondistended.  Bowel sounds are heard.   Extremities: No cyanosis, clubbing or edema.  Peripheral pulses are palpable. Psych: Alert, awake and oriented, normal mood CNS:  No cranial nerve deficits.  Power equal in all extremities.   Skin: Warm and dry.  No rashes noted.  Data Reviewed:   CBC: Recent Labs  Lab 08/13/22 1440 08/14/22 0454 08/15/22 0800 08/16/22 0540 08/18/22 2025 08/19/22 0500  WBC 24.6* 23.2* 15.9* 11.8* 20.1* 20.9*  NEUTROABS 21.6* 19.8* 13.1* 9.0* 15.5*  --   HGB 8.5* 7.5* 7.7* 7.4* 7.7* 7.3*  HCT 24.7* 21.6* 23.5* 22.4* 24.9* 23.3*  MCV 92.9 92.7 89.4 87.5 89.2 90.7  PLT 334 300 346 308 376 062    Basic Metabolic Panel: Recent Labs  Lab 08/13/22 2004 08/14/22 0454 08/15/22 0800 08/16/22 0540 08/17/22 0141 08/18/22 2025 08/19/22 0500  NA  --  142 145 144 143 140 144  K  --  3.2* 3.5 3.4* 3.6 3.0* 3.1*  CL  --  113* 118* 117* 116* 115* 116*  CO2  --  20* 20* 20* 20* 17* 19*   GLUCOSE  --  107* 81 83 87 97 90  BUN  --  14 10 <5* 5* 16 15  CREATININE  --  1.07* 0.94 0.86 0.76 0.91 0.88  CALCIUM  --  7.8* 8.2* 8.0* 8.3* 8.0* 8.0*  MG 1.9 1.9 1.9 1.8  --   --   --   PHOS 3.6 2.9 2.7 2.5  --   --   --     Liver Function Tests: Recent Labs  Lab 08/14/22 0454 08/15/22 0800 08/16/22 0540 08/18/22 2025 08/19/22 0500  AST 19  16 17 25 20   ALT 14 14 13 16 14   ALKPHOS 99 109 102 91 77  BILITOT 0.8 0.8 0.9 0.7 0.9  PROT 5.5* 5.3* 5.0* 5.8* 5.0*  ALBUMIN 2.2* 2.0* 1.9* 2.2* 2.0*     Radiology Studies: DG CHEST PORT 1 VIEW  Result Date: 08/19/2022 CLINICAL DATA:  Collapse of left lung. EXAM: PORTABLE CHEST 1 VIEW COMPARISON:  Chest x-ray 08/19/2022. FINDINGS: Unchanged near complete opacification left hemithorax with volume loss. Right lung is clear. No visible pneumothorax. Polyarticular degenerative change. Cardiomediastinal silhouette is largely obscured. IMPRESSION: Unchanged near complete opacification left hemithorax with volume loss, further described on chest x-ray from yesterday. Recommend imaging follow-up to resolution. Electronically Signed   By: Margaretha Sheffield M.D.   On: 08/19/2022 08:25   DG Chest 2 View  Result Date: 08/18/2022 CLINICAL DATA:  Dyspnea, altered mental status, hypoxia, suspected sepsis EXAM: CHEST - 2 VIEW COMPARISON:  08/13/2022 chest radiograph. FINDINGS: Left rotated chest radiograph. New complete opacification of the left hemithorax with left mediastinal shift and volume loss in the left hemithorax. No pneumothorax. Clear right lung. No right pleural effusion. IMPRESSION: New complete opacification of the left hemithorax with left mediastinal shift and volume loss in the left hemithorax. Given the rapid change since 08/13/2022 chest radiograph, findings most likely represent mucous plugging in the central left lung airways with complete left lung atelectasis, with underlying pneumonia not excluded. Chest radiograph follow-up advised  at minimum. Electronically Signed   By: Ilona Sorrel M.D.   On: 08/18/2022 18:45      LOS: 1 day    Flora Lipps, MD [facility] Available via Epic secure chat 7am-7pm After these hours, please refer to coverage provider listed on [email or web address] 08/19/2022, 12:47 PM

## 2022-08-19 NOTE — ED Notes (Signed)
Patient had another loose dark stool.  Pericare and linens changed.  Patient remains on the monitor.  She is noted to rest on her left side as a preference.  She has no edema noted.  Abdomen is soft with bowel sounds noted in all quads.  Patient skin is warm to the touch.  Temp is 99.0 oral.  IV x 2 remain intact.  Patient tolerated po meds given in applesauce.  Patient awaiting admission at this time

## 2022-08-19 NOTE — Hospital Course (Addendum)
Stephanie Benton is a 72 y.o. female with past medical history of hypertension, hyperlipidemia, bipolar disorder, AKI, chronic abdominal pain, chronic pancreatitis, COPD who was discharged to skilled nursing facility on 08/18/2022 after being admitted for severe sepsis from community-acquired pneumonia and COVID-19 infection diagnosed on January 9.  Patient was brought back secondary to worsening shortness of breath and hypoxia.  Chest x-ray in the ER showed left-sided whiteout consistent with atelectasis.  This was most likely mucous plugging.  There is was evidence of volume loss with tracheal deviation to the left.  Patient was then readmitted to hospital for further evaluation and treatment.  Patient was initially tachycardic with leukocytosis and pulse ox was 91% on 3 L of oxygen.

## 2022-08-19 NOTE — Progress Notes (Signed)
Started manual CPT 08/19/22 with AMBU mask (as cup). PT tolerated well with NPC at this time. BBS clear-diminished.

## 2022-08-19 NOTE — ED Notes (Signed)
Patient stated she needed her tramadol for pain   it is prn every 8 hours   tramadol was given at this time

## 2022-08-19 NOTE — ED Notes (Signed)
Patient is resting.  She reports she is having chest discomfort.  She remains on the cardiac monitor.  Patient reports this is the same chest pain she has had.  Patient is here for

## 2022-08-19 NOTE — ED Notes (Signed)
Pt had 3 sips of water and 1 tbsp of food (mac/meatloaf).

## 2022-08-19 NOTE — ED Notes (Signed)
Patient took a few sips of ensure.  Will continue to have her take more sips due to poor po intake

## 2022-08-20 ENCOUNTER — Inpatient Hospital Stay (HOSPITAL_COMMUNITY): Payer: 59

## 2022-08-20 DIAGNOSIS — A419 Sepsis, unspecified organism: Secondary | ICD-10-CM | POA: Diagnosis not present

## 2022-08-20 DIAGNOSIS — J189 Pneumonia, unspecified organism: Secondary | ICD-10-CM | POA: Diagnosis not present

## 2022-08-20 DIAGNOSIS — R6520 Severe sepsis without septic shock: Secondary | ICD-10-CM | POA: Diagnosis not present

## 2022-08-20 DIAGNOSIS — F3178 Bipolar disorder, in full remission, most recent episode mixed: Secondary | ICD-10-CM | POA: Diagnosis not present

## 2022-08-20 DIAGNOSIS — J449 Chronic obstructive pulmonary disease, unspecified: Secondary | ICD-10-CM | POA: Diagnosis not present

## 2022-08-20 LAB — CBC
HCT: 19.4 % — ABNORMAL LOW (ref 36.0–46.0)
HCT: 18.3 % — ABNORMAL LOW (ref 36.0–46.0)
Hemoglobin: 5.5 g/dL — CL (ref 12.0–15.0)
Hemoglobin: 5.5 g/dL — CL (ref 12.0–15.0)
MCH: 27.4 pg (ref 26.0–34.0)
MCH: 28.1 pg (ref 26.0–34.0)
MCHC: 28.4 g/dL — ABNORMAL LOW (ref 30.0–36.0)
MCHC: 30.1 g/dL (ref 30.0–36.0)
MCV: 96.5 fL (ref 80.0–100.0)
MCV: 93.4 fL (ref 80.0–100.0)
Platelets: 228 10*3/uL (ref 150–400)
Platelets: 204 10*3/uL (ref 150–400)
RBC: 2.01 MIL/uL — ABNORMAL LOW (ref 3.87–5.11)
RBC: 1.96 MIL/uL — ABNORMAL LOW (ref 3.87–5.11)
RDW: 20.2 % — ABNORMAL HIGH (ref 11.5–15.5)
RDW: 19.9 % — ABNORMAL HIGH (ref 11.5–15.5)
WBC: 10.7 10*3/uL — ABNORMAL HIGH (ref 4.0–10.5)
WBC: 8.3 10*3/uL (ref 4.0–10.5)
nRBC: 0.7 % — ABNORMAL HIGH (ref 0.0–0.2)
nRBC: 0.5 % — ABNORMAL HIGH (ref 0.0–0.2)

## 2022-08-20 LAB — BASIC METABOLIC PANEL
Anion gap: 5 (ref 5–15)
BUN: 11 mg/dL (ref 8–23)
CO2: 19 mmol/L — ABNORMAL LOW (ref 22–32)
Calcium: 7.7 mg/dL — ABNORMAL LOW (ref 8.9–10.3)
Chloride: 121 mmol/L — ABNORMAL HIGH (ref 98–111)
Creatinine, Ser: 0.78 mg/dL (ref 0.44–1.00)
GFR, Estimated: >60 mL/min (ref >60)
Glucose, Bld: 116 mg/dL — ABNORMAL HIGH (ref 70–99)
Potassium: 3.4 mmol/L — ABNORMAL LOW (ref 3.5–5.1)
Sodium: 145 mmol/L (ref 135–145)

## 2022-08-20 LAB — BLOOD GAS, ARTERIAL
Acid-base deficit: 1.6 mmol/L (ref 0.0–2.0)
Bicarbonate: 19.6 mmol/L — ABNORMAL LOW (ref 20.0–28.0)
O2 Saturation: 98.3 %
Patient temperature: 37
pCO2 arterial: 24 mmHg — ABNORMAL LOW (ref 32–48)
pH, Arterial: 7.52 — ABNORMAL HIGH (ref 7.35–7.45)
pO2, Arterial: 155 mmHg — ABNORMAL HIGH (ref 83–108)

## 2022-08-20 LAB — PREPARE RBC (CROSSMATCH)

## 2022-08-20 LAB — MAGNESIUM: Magnesium: 1.6 mg/dL — ABNORMAL LOW (ref 1.7–2.4)

## 2022-08-20 MED ORDER — LACTATED RINGERS IV BOLUS
250.0000 mL | Freq: Once | INTRAVENOUS | Status: AC
  Administered 2022-08-20: 250 mL via INTRAVENOUS

## 2022-08-20 MED ORDER — FUROSEMIDE 10 MG/ML IJ SOLN
20.0000 mg | Freq: Once | INTRAMUSCULAR | Status: AC
  Administered 2022-08-20: 20 mg via INTRAVENOUS
  Filled 2022-08-20: qty 2

## 2022-08-20 MED ORDER — IPRATROPIUM-ALBUTEROL 0.5-2.5 (3) MG/3ML IN SOLN
3.0000 mL | Freq: Four times a day (QID) | RESPIRATORY_TRACT | Status: DC
  Administered 2022-08-20 – 2022-08-29 (×37): 3 mL via RESPIRATORY_TRACT
  Filled 2022-08-20 (×37): qty 3

## 2022-08-20 MED ORDER — SODIUM CHLORIDE 0.9% IV SOLUTION: Freq: Once | INTRAVENOUS | Status: AC

## 2022-08-20 MED ORDER — MAGNESIUM SULFATE 2 GM/50ML IV SOLN
2.0000 g | Freq: Once | INTRAVENOUS | Status: AC
  Administered 2022-08-20: 2 g via INTRAVENOUS
  Filled 2022-08-20: qty 50

## 2022-08-20 NOTE — Progress Notes (Signed)
Spoke with blood bank and waiting for type and screen antibodies to be completed. Both units are still not ready at this point. They stated they would call back when units are ready

## 2022-08-20 NOTE — Progress Notes (Signed)
Cpt done at this time with vest.

## 2022-08-20 NOTE — Progress Notes (Signed)
PROGRESS NOTE    Stephanie Benton  VVO:[id number] DOB: [birth year] DOA: 08/18/2022 PCP: Sharmon Revere, MD    Brief Narrative:  Stephanie Benton is a 72 y.o. female with past medical history of hypertension, hyperlipidemia, bipolar disorder,  chronic abdominal pain, chronic pancreatitis, COPD who was discharged to skilled nursing facility on 08/18/2022 after being admitted for severe sepsis from community-acquired pneumonia and COVID-19 infection diagnosed on August 13, 2022.  Patient was brought back secondary to worsening shortness of breath and hypoxia.  Chest x-ray in the ER showed left-sided whiteout consistent with atelectasis.  This was most likely mucous plugging.  There is was evidence of volume loss with tracheal deviation to the left.    Patient was initially tachycardic with leukocytosis and pulse ox was 91% on 3 L of oxygen. Patient was then readmitted to hospital for further evaluation and treatment.  During hospitalization, pulmonary was consulted and at this time, conservative treatment has been initiated.  Patient was sent from skilled nursing facility and will likely need to go to skilled nursing facility on discharge.  Assessment and plan.  Severe evere sepsis due to pneumonia:  Patient has been started on IV vancomycin and cefepime for healthcare associated pneumonia.  Patient had significant leukocytosis on presentation WBC today at 10.7.  Temperature max of 99 F.  On 3 L of nasal cannula oxygen.  Respiratory viral panel negative.  MRSA PCR positive.   Blood cultures negative in 12 hours.  Left lung mucous plugging with lung collapse, atelectasis.   Pulmonary was consulted and recommendations include hypertonic saline nebs,  incentive spirometry and flutter valve with manual chest physiotherapy.  Pulmonary advises against bronchoscopy at this time.   Repeat chest x-ray from today without any change with complete opacification.  Pulmonary to follow.  Will communicate with  pulmonary to see if there is coexisting pleural effusion/hemorrhagic.  Recent COVID-19 infection: Diagnosed on August 13, 2022.  Day 8 today.  Continue airborne precautions.   Acute kidney injury: Creatinine at 0.7.  Improved.  Check BMP in AM.   Hypokalemia: Continue to hold diuretics.  Potassium was 3.4.  Continue to replenish potassium.  Normocytic anemia: Hemoglobin today at the 5.5.  No obvious bleeding reported as per the nursing staff or patient.  Will transfuse 2 units of packed RBC.  Discontinue Lovenox.   Bipolar disorder: In full remission.     Tobacco abuse: Continue nicotine patch.      DVT prophylaxis: Place and maintain sequential compression device Start: 08/20/22 0802.  Will DC Lovenox.   Code Status:     Code Status: DNR  Disposition: Skilled nursing facility likely in 2 to 3 days, follow pulmonary recommendations.  Status is: Inpatient  Remains inpatient appropriate because: Healthcare-associated pneumonia, left hemithorax opacification and collapse.   Family Communication:  Spoke with the patient's son at bedside on 08/19/2022  Consultants:  Pulmonary  Procedures:  Manual chest physical therapy.  Antimicrobials:  Vancomycin and cefepime IV.  Anti-infectives (From admission, onward)    Start     Dose/Rate Route Frequency Ordered Stop   08/19/22 2200  vancomycin (VANCOREADY) IVPB 1250 mg/250 mL        1,250 mg 166.7 mL/hr over 90 Minutes Intravenous Every 24 hours 08/18/22 2230     08/19/22 1000  ceFEPIme (MAXIPIME) 2 g in sodium chloride 0.9 % 100 mL IVPB        2 g 200 mL/hr over 30 Minutes Intravenous Every 12 hours 08/18/22 2230  08/19/22 0500  vancomycin (VANCOCIN) IVPB 1000 mg/200 mL premix  Status:  Discontinued        1,000 mg 200 mL/hr over 60 Minutes Intravenous  Once 08/19/22 0456 08/19/22 0457   08/19/22 0500  ceFEPIme (MAXIPIME) 2 g in sodium chloride 0.9 % 100 mL IVPB  Status:  Discontinued        2 g 200 mL/hr over 30 Minutes  Intravenous  Once 08/19/22 0456 08/19/22 0458   08/18/22 2115  vancomycin (VANCOREADY) IVPB 1500 mg/300 mL        1,500 mg 150 mL/hr over 120 Minutes Intravenous  Once 08/18/22 2106 08/19/22 0033   08/18/22 2045  vancomycin (VANCOCIN) IVPB 1000 mg/200 mL premix  Status:  Discontinued        1,000 mg 200 mL/hr over 60 Minutes Intravenous  Once 08/18/22 2044 08/18/22 2105   08/18/22 2045  ceFEPIme (MAXIPIME) 2 g in sodium chloride 0.9 % 100 mL IVPB        2 g 200 mL/hr over 30 Minutes Intravenous  Once 08/18/22 2044 08/18/22 2210      Subjective: Today, patient was seen and examined at bedside.  Patient states that she feels okay.  Feels a little sleepy.  Denies any chest pain but has some cough.    Objective: Vitals:   08/20/22 0621 08/20/22 0624 08/20/22 0739 08/20/22 1128  BP: (!) 80/52 (!) 84/45    Pulse: 78 79    Resp: 20 (!) 22    Temp: 98.3 F (36.8 C)     TempSrc: Oral     SpO2: 100%  100% 97%  Weight:      Height:       No intake or output data in the 24 hours ending 08/20/22 1131 Filed Weights   08/18/22 1753 08/19/22 1835  Weight: 73.9 kg 88.8 kg    Physical Examination: Body mass index is 32.58 kg/m.   General: Alert awake and Communicative, on nasal cannula oxygen, appears chronically ill weak and deconditioned.  Lying mostly on the left side.  Obese build. HENT:   No scleral pallor or icterus noted. Oral mucosa is moist.  Chest: Diminished breath sounds mostly on the left side.   CVS: S1 &S2 heard. No murmur.  Regular rate and rhythm.  Tachycardia has improved. Abdomen: Soft, nontender, nondistended.  Bowel sounds are heard.   Extremities: No cyanosis, clubbing or edema.  Peripheral pulses are palpable. Psych: Alert, awake and mildly Communicative, CNS:  No cranial nerve deficits.  Moves extremities. Skin: Warm and dry.  No rashes noted.  Data Reviewed:   CBC: Recent Labs  Lab 08/13/22 1440 08/14/22 0454 08/15/22 0800 08/16/22 0540 08/18/22 2025  08/19/22 0500 08/20/22 0510  WBC 24.6* 23.2* 15.9* 11.8* 20.1* 20.9* 10.7*  NEUTROABS 21.6* 19.8* 13.1* 9.0* 15.5*  --   --   HGB 8.5* 7.5* 7.7* 7.4* 7.7* 7.3* 5.5*  HCT 24.7* 21.6* 23.5* 22.4* 24.9* 23.3* 19.4*  MCV 92.9 92.7 89.4 87.5 89.2 90.7 96.5  PLT 334 300 346 308 376 331 228     Basic Metabolic Panel: Recent Labs  Lab 08/13/22 2004 08/14/22 0454 08/15/22 0800 08/16/22 0540 08/17/22 0141 08/18/22 2025 08/19/22 0500 08/20/22 0510  NA  --  142 145 144 143 140 144 145  K  --  3.2* 3.5 3.4* 3.6 3.0* 3.1* 3.4*  CL  --  113* 118* 117* 116* 115* 116* 121*  CO2  --  20* 20* 20* 20* 17* 19* 19*  GLUCOSE  --  107* 81 83 87 97 90 116*  BUN  --  14 10 <5* 5* 16 15 11   CREATININE  --  1.07* 0.94 0.86 0.76 0.91 0.88 0.78  CALCIUM  --  7.8* 8.2* 8.0* 8.3* 8.0* 8.0* 7.7*  MG 1.9 1.9 1.9 1.8  --   --   --  1.6*  PHOS 3.6 2.9 2.7 2.5  --   --   --   --      Liver Function Tests: Recent Labs  Lab 08/14/22 0454 08/15/22 0800 08/16/22 0540 08/18/22 2025 08/19/22 0500  AST 19 16 17 25 20   ALT 14 14 13 16 14   ALKPHOS 99 109 102 91 77  BILITOT 0.8 0.8 0.9 0.7 0.9  PROT 5.5* 5.3* 5.0* 5.8* 5.0*  ALBUMIN 2.2* 2.0* 1.9* 2.2* 2.0*      Radiology Studies: DG CHEST PORT 1 VIEW  Result Date: 08/20/2022 CLINICAL DATA:  Pneumonia.  COVID-19. EXAM: PORTABLE CHEST 1 VIEW COMPARISON:  August 19, 2022 FINDINGS: The left hemithorax is completely opacified. The tiny amount of aerated lung in the left upper lobe on the previous study is now opacified. The cardiomediastinal silhouette is not well assessed due to left lung opacification. No obvious abnormality. No pneumothorax on the right. No focal infiltrate on the right. No overt edema. IMPRESSION: The left hemithorax is completely opacified. The tiny amount of aerated lung in the left upper lobe on the previous study is now opacified. The findings are concerning for a large pleural effusion with underlying atelectasis or infiltrate.  Electronically Signed   By: Dorise Bullion III M.D.   On: 08/20/2022 07:56   DG CHEST PORT 1 VIEW  Result Date: 08/19/2022 CLINICAL DATA:  Collapse of left lung. EXAM: PORTABLE CHEST 1 VIEW COMPARISON:  Chest x-ray 08/19/2022. FINDINGS: Unchanged near complete opacification left hemithorax with volume loss. Right lung is clear. No visible pneumothorax. Polyarticular degenerative change. Cardiomediastinal silhouette is largely obscured. IMPRESSION: Unchanged near complete opacification left hemithorax with volume loss, further described on chest x-ray from yesterday. Recommend imaging follow-up to resolution. Electronically Signed   By: Margaretha Sheffield M.D.   On: 08/19/2022 08:25   DG Chest 2 View  Result Date: 08/18/2022 CLINICAL DATA:  Dyspnea, altered mental status, hypoxia, suspected sepsis EXAM: CHEST - 2 VIEW COMPARISON:  08/13/2022 chest radiograph. FINDINGS: Left rotated chest radiograph. New complete opacification of the left hemithorax with left mediastinal shift and volume loss in the left hemithorax. No pneumothorax. Clear right lung. No right pleural effusion. IMPRESSION: New complete opacification of the left hemithorax with left mediastinal shift and volume loss in the left hemithorax. Given the rapid change since 08/13/2022 chest radiograph, findings most likely represent mucous plugging in the central left lung airways with complete left lung atelectasis, with underlying pneumonia not excluded. Chest radiograph follow-up advised at minimum. Electronically Signed   By: Ilona Sorrel M.D.   On: 08/18/2022 18:45      LOS: 2 days    Flora Lipps, MD [facility] Available via Epic secure chat 7am-7pm After these hours, please refer to coverage provider listed on [email or web address] 08/20/2022, 11:31 AM

## 2022-08-20 NOTE — Progress Notes (Signed)
NAME:  Stephanie Benton, MRN:  [id number], DOB:  1950/10/02, LOS: 2 ADMISSION DATE:  08/18/2022, CONSULTATION DATE:  08/20/22 REFERRING MD:  Louanne Belton, CHIEF COMPLAINT:  pneumonia  History of Present Illness:  Stephanie Benton is a 72 y.o. F with PMH significant for COPD, Bipolar Disorder, HTN, HL, pancreatitis who presented from SNF with increased dyspnea.  She was recently admitted with pneumonia and Covid-19 from 1/9-1/13, she was initially treated with aztreonam, azithromycin and vanc and discharged on omnicef and was on room air .   She returned to the ED on 1/14 with worsening dyspnea and CXR showed opacification of the L hemithorax suggestive of mucous plugging.  She required 2-3L oxygen and labs were significant for WBC of 20k, Hgb 7.3, lactic acid 2.4, procalcitonin 0.61.   She was admitted and started on Vancomycin and Cefepime along with hypertonic saline nebs, IS and chest PT.  A repeat CXR this morning was unchanged, so PCCM consulted.  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   Significant Hospital Events: Including procedures, antibiotic start and stop dates in addition to other pertinent events   1/15 presented with L hemithorax opacification and dyspnea, PCCM consult 1/16 more somnolent with Hgb drop  Interim History / Subjective:  CXR without improvement in L-sided opacification More somnolent today  Hgb drop from 7.3 to 5.5 without evidence of bleeding   Objective   Blood pressure (!) 106/57, pulse 70, temperature 98.3 F (36.8 C), temperature source Oral, resp. rate (!) 22, height 5\' 5"  (1.651 m), weight 88.8 kg, SpO2 100 %.       No intake or output data in the 24 hours ending 08/20/22 1358 Filed Weights   08/18/22 1753 08/19/22 1835  Weight: 73.9 kg 88.8 kg   General:  thin, elderly F sleeping in no acute distress HEENT: MM pink/moist,  sclera anicteric Neuro: somnolent, arousable to voice and stimulation but quickly falls back asleep  CV: s1s2 rrr, no m/r/g PULM:  minimal air entry L-side, no crackles or wheezing, no accessory muscle use or tachypnea on 2L [place] GI: soft, non-distended  Extremities: warm/dry, no edema  Skin: no rashes or lesions   Resolved Hospital Problem list     Assessment & Plan:   L hemithorax opacification and possible infiltrate resulting in acute hypoxic respiratory failure Discharged 1/13 for CAP and Covid on Omnicef and re-admitted one day later Strep pneumo and legionella were negative 5 days ago, covid + -most likely secondary to mucous plugging, has been getting hypertonic saline nebs, aggressive chest PT and CXR unchanged today -she is more somnolent, will likely not be able to clear secretions on her own, plan for possible bronch tomorrow 1/17 -NPO -continue broad spectrum abx - extended RVP negative and sputum culture pending -blood cultures pending  -bedside US today with Dr. Erin Fulling revealed small L-sided effusion but mostly consolidated lung -thank you for this consult, we will continue to follow with you  Acute Encephalopathy Pt is more somnolent, though vital signs are stable and she is protecting her airway -spoke with patient's son, states that at baseline she is up all night and sleeps during the day, there has been recent concern for dementia.  She will normally interact in the evenings and at night   Acute Anemia Hgb drop from 7.3 to 5.5 BP soft but improving prior to transfusion No evidence of overt bleeding  and no large effusion with POCUS to suggest hemothorax -repeat CBC to ensure result is accurate -2 units transfusion ordered, follow CBC and monitor for signs of bleeding   Best Practice (right click and "Reselect all SmartList Selections" daily)   Per primary  Labs   CBC: Recent Labs  Lab 08/13/22 1440 08/14/22 0454 08/15/22 0800 08/16/22 0540  08/18/22 2025 08/19/22 0500 08/20/22 0510  WBC 24.6* 23.2* 15.9* 11.8* 20.1* 20.9* 10.7*  NEUTROABS 21.6* 19.8* 13.1* 9.0* 15.5*  --   --   HGB 8.5* 7.5* 7.7* 7.4* 7.7* 7.3* 5.5*  HCT 24.7* 21.6* 23.5* 22.4* 24.9* 23.3* 19.4*  MCV 92.9 92.7 89.4 87.5 89.2 90.7 96.5  PLT 334 300 346 308 376 331 228     Basic Metabolic Panel: Recent Labs  Lab 08/13/22 2004 08/14/22 0454 08/15/22 0800 08/16/22 0540 08/17/22 0141 08/18/22 2025 08/19/22 0500 08/20/22 0510  NA  --  142 145 144 143 140 144 145  K  --  3.2* 3.5 3.4* 3.6 3.0* 3.1* 3.4*  CL  --  113* 118* 117* 116* 115* 116* 121*  CO2  --  20* 20* 20* 20* 17* 19* 19*  GLUCOSE  --  107* 81 83 87 97 90 116*  BUN  --  14 10 <5* 5* 16 15 11   CREATININE  --  1.07* 0.94 0.86 0.76 0.91 0.88 0.78  CALCIUM  --  7.8* 8.2* 8.0* 8.3* 8.0* 8.0* 7.7*  MG 1.9 1.9 1.9 1.8  --   --   --  1.6*  PHOS 3.6 2.9 2.7 2.5  --   --   --   --     GFR: Estimated Creatinine Clearance: 69.9 mL/min (by C-G formula based on SCr of 0.78 mg/dL). Recent Labs  Lab 08/13/22 2004 08/14/22 0120 08/14/22 0453 08/14/22 0454 08/15/22 0800 08/16/22 0540 08/18/22 2025 08/19/22 0030 08/19/22 0500 08/20/22 0510  PROCALCITON 2.85  --   --  3.36 2.46  --   --   --  0.61  --   WBC  --   --   --  23.2* 15.9* 11.8* 20.1*  --  20.9* 10.7*  LATICACIDVEN 1.0 0.9 1.4  --   --   --  2.4* 1.7  --   --      Liver Function Tests: Recent Labs  Lab 08/14/22 0454 08/15/22 0800 08/16/22 0540 08/18/22 2025 08/19/22 0500  AST 19 16 17 25 20   ALT 14 14 13 16 14   ALKPHOS 99 109 102 91 77  BILITOT 0.8 0.8 0.9 0.7 0.9  PROT 5.5* 5.3* 5.0* 5.8* 5.0*  ALBUMIN 2.2* 2.0* 1.9* 2.2* 2.0*    Recent Labs  Lab 08/13/22 2004  LIPASE 46    No results for input(s): "AMMONIA" in the last 168 hours.  ABG    Component Value Date/Time   HCO3 20.4 08/18/2022 2128   TCO2 23 08/13/2022 1417   ACIDBASEDEF 2.7 (H) 08/18/2022 2128   O2SAT 46.3 08/18/2022 2128     Coagulation  Profile: Recent Labs  Lab 08/13/22 1440 08/18/22 2025 08/19/22 0500  INR 1.4* 1.2 1.3*     Cardiac Enzymes: Recent Labs  Lab 08/13/22 1814  CKTOTAL 29*     HbA1C: No results found for: "HGBA1C"  CBG: Recent Labs  Lab 08/15/22 0758 08/15/22 1118 08/15/22 1743 08/15/22 2138 08/18/22 1750  GLUCAP 92 93 95 99 84     Review of Systems:   Unable to obtain secondary to disorientation  Past Medical  History:  She,  has a past medical history of Hyperlipidemia, Hypertension, and Prolonged QT interval (06/21/2022).   Surgical History:   Past Surgical History:  Procedure Laterality Date   ABDOMINAL HYSTERECTOMY     APPENDECTOMY     BIOPSY  05/17/2022   Procedure: BIOPSY;  Surgeon: Carol Ada, MD;  Location: [facility] ENDOSCOPY;  Service: Gastroenterology;;   CHOLECYSTECTOMY N/A 05/22/2022   Procedure: LAPAROSCOPIC CHOLECYSTECTOMY WITH INTRAOPERATIVE CHOLANGIOGRAM;  Surgeon: Coralie Keens, MD;  Location: [place];  Service: General;  Laterality: N/A;   ESOPHAGOGASTRODUODENOSCOPY (EGD) WITH PROPOFOL N/A 05/17/2022   Procedure: ESOPHAGOGASTRODUODENOSCOPY (EGD) WITH PROPOFOL;  Surgeon: Carol Ada, MD;  Location: [place];  Service: Gastroenterology;  Laterality: N/A;   ESOPHAGOGASTRODUODENOSCOPY (EGD) WITH PROPOFOL N/A 07/12/2022   Procedure: ESOPHAGOGASTRODUODENOSCOPY (EGD) WITH PROPOFOL;  Surgeon: Carol Ada, MD;  Location: [facility] ENDOSCOPY;  Service: Gastroenterology;  Laterality: N/A;   HERNIA REPAIR     TONSILLECTOMY     TUBAL LIGATION       Social History:   reports that she has been smoking cigarettes. She has been smoking an average of .75 packs per day. She has never used smokeless tobacco. She reports that she does not drink alcohol and does not use drugs.   Family History:  Her family history includes Cancer in her father and sister; Heart failure in her mother.   Allergies Allergies  Allergen Reactions   Fish Allergy Anaphylaxis   Morphine And Related  Anaphylaxis and Swelling   Belsomra [Suvorexant] Other (See Comments)    Nightmares     Codeine Nausea And Vomiting   Duragesic-100 [Fentanyl] Hives   Motrin [Ibuprofen] Nausea Only and Other (See Comments)    History of stomach ulcers   Nsaids Other (See Comments)    History of stomach ulcers   Penicillins Hives    Did it involve swelling of the face/tongue/throat, SOB, or low BP? Yes Did it involve sudden or severe rash/hives, skin peeling, or any reaction on the inside of your mouth or nose? Yes Did you need to seek medical attention at a hospital or doctor's office? Yes When did it last happen? More than 10 years ago If all above answers are "NO", may proceed with cephalosporin use.    Sulfa Antibiotics Swelling    Not listed on MAR   Tolectin [Tolmetin] Other (See Comments)    History of stomach ulcers Not listed on MAR     Home Medications  Prior to Admission medications   Medication Sig Start Date End Date Taking? Authorizing Provider  albuterol (PROVENTIL) (2.5 MG/3ML) 0.083% nebulizer solution Take 2.5 mg by nebulization every 6 (six) hours as needed for wheezing or shortness of breath.   Yes [provider]  ascorbic acid (VITAMIN C) 500 MG tablet Take 1 tablet (500 mg total) by mouth daily. Patient taking differently: Take 500 mg by mouth in the morning. (1000) 07/23/22  Yes Dahal, Marlowe Aschoff, MD  dorzolamide (TRUSOPT) 2 % ophthalmic solution Place 1 drop into both eyes 2 (two) times daily. (1000, 1800)   Yes [provider]  DULoxetine HCl 40 MG CSDR Take 40 mg by mouth in the morning. (1000)   Yes [provider]  ferrous sulfate 325 (65 FE) MG tablet Take 325 mg by mouth 2 (two) times daily. (1000, 1800)   Yes [provider]  folic acid (FOLVITE) 1 MG tablet Take 1 mg by mouth daily.   Yes [provider]  latanoprost (XALATAN) 0.005 % ophthalmic solution Place 1  drop into both eyes at bedtime. (2100)   Yes [provider]  losartan (COZAAR) 100 MG tablet Take 0.5 tablets (50 mg total) by mouth daily. (1000) 08/17/22  Yes Marinda Elk, MD  Multiple Vitamins-Minerals (MULTIVITAMIN WITH MINERALS) tablet Take 1 tablet by mouth in the morning. (1000)   Yes [provider]  Nutritional Supplements (ENSURE ORIGINAL) LIQD Take 1 each by mouth 2 (two) times daily. (1000, 1800)   Yes [provider]  ondansetron (ZOFRAN) 4 MG tablet Take 4 mg by mouth every 8 (eight) hours as needed for nausea or vomiting.   Yes [provider]  pantoprazole (PROTONIX) 40 MG tablet Take 40 mg by mouth 2 (two) times daily. (0600, 1600)   Yes [provider]  polyethylene glycol powder (GLYCOLAX/MIRALAX) 17 GM/SCOOP powder Take 1 cap full (17 g) with water by mouth daily. Patient taking differently: Take 17 g by mouth in the morning. (1000) 05/24/22  Yes Zigmund Daniel., MD  potassium chloride (KLOR-CON) 10 MEQ tablet Take 20 mEq by mouth 2 (two) times daily. (0900, 1800)   Yes [provider]  tolterodine (DETROL) 2 MG tablet Take 2 mg by mouth in the morning. (1000)   Yes [provider]  torsemide (DEMADEX) 10 MG tablet Take 10 mg by mouth daily as needed (edema).   Yes [provider]  traMADol (ULTRAM) 50 MG tablet Take 50 mg by mouth every 8 (eight) hours as needed for severe pain or moderate pain.   Yes [provider]  Vitamin D, Ergocalciferol, (DRISDOL) 1.25 MG (50000 UNIT) CAPS capsule Take 1 capsule (50,000 Units total) by mouth every 7 (seven) days. Patient taking differently: Take 50,000 Units by mouth See admin instructions. 50,000 units once a week on Tuesday (1000) 07/24/22  Yes Dahal, Melina Schools, MD  budesonide-formoterol (SYMBICORT) 160-4.5 MCG/ACT inhaler Inhale 2 puffs into the lungs every 12 (twelve) hours as needed (wheezing).    [provider]  carvedilol (COREG) 25 MG tablet Take 1 tablet (25 mg total) by mouth 2  (two) times daily with a meal. 08/17/22   Marinda Elk, MD  cefdinir (OMNICEF) 300 MG capsule Take 1 capsule (300 mg total) by mouth every 12 (twelve) hours for 2 days. 08/17/22 08/19/22  Marinda Elk, MD  Emollient (COLLAGEN EX) Apply 1 application  topically See admin instructions. Cleanse right knee, pat dry, apply collagen and cover with silicone dressing every Monday, Wednesday, Friday during day shift.    [provider]     Critical care time: n/a     Darcella Gasman Marcelino Campos, PA-C [facility] See Amion for pager If no response to pager , please call [phone] until 7pm After 7:00 pm call Elink  [phone]

## 2022-08-21 ENCOUNTER — Inpatient Hospital Stay (HOSPITAL_COMMUNITY): Payer: 59

## 2022-08-21 ENCOUNTER — Encounter (HOSPITAL_COMMUNITY): Payer: Self-pay | Admitting: Internal Medicine

## 2022-08-21 ENCOUNTER — Inpatient Hospital Stay (HOSPITAL_COMMUNITY): Payer: 59 | Admitting: Certified Registered Nurse Anesthetist

## 2022-08-21 ENCOUNTER — Encounter (HOSPITAL_COMMUNITY)
Admission: EM | Disposition: A | Discharge status: Skilled Nursing Facility | Payer: Self-pay | Source: Home / Self Care | Attending: Internal Medicine

## 2022-08-21 DIAGNOSIS — I10 Essential (primary) hypertension: Secondary | ICD-10-CM

## 2022-08-21 DIAGNOSIS — F3178 Bipolar disorder, in full remission, most recent episode mixed: Secondary | ICD-10-CM | POA: Diagnosis not present

## 2022-08-21 DIAGNOSIS — F17210 Nicotine dependence, cigarettes, uncomplicated: Secondary | ICD-10-CM

## 2022-08-21 DIAGNOSIS — J9601 Acute respiratory failure with hypoxia: Secondary | ICD-10-CM

## 2022-08-21 DIAGNOSIS — J9811 Atelectasis: Secondary | ICD-10-CM | POA: Diagnosis not present

## 2022-08-21 DIAGNOSIS — J449 Chronic obstructive pulmonary disease, unspecified: Secondary | ICD-10-CM | POA: Diagnosis not present

## 2022-08-21 DIAGNOSIS — J189 Pneumonia, unspecified organism: Secondary | ICD-10-CM | POA: Diagnosis not present

## 2022-08-21 DIAGNOSIS — A419 Sepsis, unspecified organism: Secondary | ICD-10-CM | POA: Diagnosis not present

## 2022-08-21 HISTORY — PX: BRONCHIAL WASHINGS: SHX5105

## 2022-08-21 HISTORY — PX: VIDEO BRONCHOSCOPY: SHX5072

## 2022-08-21 LAB — CBC
HCT: 31.3 % — ABNORMAL LOW (ref 36.0–46.0)
Hemoglobin: 10 g/dL — ABNORMAL LOW (ref 12.0–15.0)
MCH: 28.4 pg (ref 26.0–34.0)
MCHC: 31.9 g/dL (ref 30.0–36.0)
MCV: 88.9 fL (ref 80.0–100.0)
Platelets: 226 10*3/uL (ref 150–400)
RBC: 3.52 MIL/uL — ABNORMAL LOW (ref 3.87–5.11)
RDW: 17.3 % — ABNORMAL HIGH (ref 11.5–15.5)
WBC: 8.4 10*3/uL (ref 4.0–10.5)
nRBC: 0.7 % — ABNORMAL HIGH (ref 0.0–0.2)

## 2022-08-21 LAB — BASIC METABOLIC PANEL
Anion gap: 5 (ref 5–15)
BUN: 8 mg/dL (ref 8–23)
CO2: 22 mmol/L (ref 22–32)
Calcium: 8.2 mg/dL — ABNORMAL LOW (ref 8.9–10.3)
Chloride: 116 mmol/L — ABNORMAL HIGH (ref 98–111)
Creatinine, Ser: 0.77 mg/dL (ref 0.44–1.00)
GFR, Estimated: >60 mL/min (ref >60)
Glucose, Bld: 83 mg/dL (ref 70–99)
Potassium: 2.9 mmol/L — ABNORMAL LOW (ref 3.5–5.1)
Sodium: 143 mmol/L (ref 135–145)

## 2022-08-21 LAB — TYPE AND SCREEN
ABO/RH(D): O POS
Antibody Screen: POSITIVE
DAT, IgG: NEGATIVE
Unit division: 0
Unit division: 0

## 2022-08-21 LAB — BPAM RBC
Blood Product Expiration Date: 202402192359
Blood Product Expiration Date: 202402192359
ISSUE DATE / TIME: 202401161516
ISSUE DATE / TIME: 202401161810
Unit Type and Rh: 5100
Unit Type and Rh: 5100

## 2022-08-21 LAB — MAGNESIUM: Magnesium: 2.1 mg/dL (ref 1.7–2.4)

## 2022-08-21 LAB — BLOOD GAS, ARTERIAL
Acid-base deficit: 0.5 mmol/L (ref 0.0–2.0)
Bicarbonate: 23.1 mmol/L (ref 20.0–28.0)
O2 Saturation: 98.2 %
Patient temperature: 37
pCO2 arterial: 34 mmHg (ref 32–48)
pH, Arterial: 7.44 (ref 7.35–7.45)
pO2, Arterial: 92 mmHg (ref 83–108)

## 2022-08-21 SURGERY — VIDEO BRONCHOSCOPY WITHOUT FLUORO
Anesthesia: Monitor Anesthesia Care

## 2022-08-21 MED ORDER — FENTANYL CITRATE PF 50 MCG/ML IJ SOSY
25.0000 ug | PREFILLED_SYRINGE | INTRAMUSCULAR | Status: DC | PRN
  Administered 2022-08-22 (×3): 50 ug via INTRAVENOUS
  Administered 2022-08-23 (×3): 100 ug via INTRAVENOUS
  Administered 2022-08-23: 50 ug via INTRAVENOUS
  Filled 2022-08-21 (×3): qty 1
  Filled 2022-08-21 (×2): qty 2
  Filled 2022-08-21 (×3): qty 1

## 2022-08-21 MED ORDER — ONDANSETRON HCL 4 MG/2ML IJ SOLN
INTRAMUSCULAR | Status: DC | PRN
  Administered 2022-08-21: 4 mg via INTRAVENOUS

## 2022-08-21 MED ORDER — PROPOFOL 10 MG/ML IV BOLUS
INTRAVENOUS | Status: AC
  Filled 2022-08-21: qty 20

## 2022-08-21 MED ORDER — NOREPINEPHRINE 4 MG/250ML-% IV SOLN: 0.0000 ug/min | INTRAVENOUS | Status: DC

## 2022-08-21 MED ORDER — ORAL CARE MOUTH RINSE: 15.0000 mL | OROMUCOSAL | Status: DC | PRN

## 2022-08-21 MED ORDER — PHENYLEPHRINE 80 MCG/ML (10ML) SYRINGE FOR IV PUSH (FOR BLOOD PRESSURE SUPPORT)
PREFILLED_SYRINGE | INTRAVENOUS | Status: DC | PRN
  Administered 2022-08-21 (×2): 160 ug via INTRAVENOUS
  Administered 2022-08-21 (×2): 240 ug via INTRAVENOUS

## 2022-08-21 MED ORDER — CHLORHEXIDINE GLUCONATE CLOTH 2 % EX PADS
6.0000 | MEDICATED_PAD | Freq: Every day | CUTANEOUS | Status: DC
  Administered 2022-08-22 – 2022-08-24 (×4): 6 via TOPICAL

## 2022-08-21 MED ORDER — NICOTINE 14 MG/24HR TD PT24
14.0000 mg | MEDICATED_PATCH | Freq: Every day | TRANSDERMAL | Status: DC
  Administered 2022-08-21 – 2022-09-13 (×24): 14 mg via TRANSDERMAL
  Filled 2022-08-21 (×24): qty 1

## 2022-08-21 MED ORDER — LOSARTAN POTASSIUM 50 MG PO TABS
50.0000 mg | ORAL_TABLET | Freq: Every day | ORAL | Status: DC
  Administered 2022-08-22 – 2022-08-27 (×4): 50 mg
  Filled 2022-08-21 (×5): qty 1

## 2022-08-21 MED ORDER — ALBUMIN HUMAN 5 % IV SOLN
12.5000 g | Freq: Once | INTRAVENOUS | Status: AC
  Administered 2022-08-21: 12.5 g via INTRAVENOUS
  Filled 2022-08-21: qty 250

## 2022-08-21 MED ORDER — PHENYLEPHRINE HCL-NACL 20-0.9 MG/250ML-% IV SOLN
INTRAVENOUS | Status: DC | PRN
  Administered 2022-08-21: 60 ug/min via INTRAVENOUS

## 2022-08-21 MED ORDER — POLYETHYLENE GLYCOL 3350 17 G PO PACK
17.0000 g | PACK | Freq: Every day | ORAL | Status: DC
  Administered 2022-08-22 – 2022-08-25 (×4): 17 g
  Filled 2022-08-21 (×4): qty 1

## 2022-08-21 MED ORDER — PROPOFOL 10 MG/ML IV BOLUS
INTRAVENOUS | Status: DC | PRN
  Administered 2022-08-21: 90 mg via INTRAVENOUS
  Administered 2022-08-21: 60 mg via INTRAVENOUS

## 2022-08-21 MED ORDER — FENTANYL CITRATE PF 50 MCG/ML IJ SOSY
25.0000 ug | PREFILLED_SYRINGE | INTRAMUSCULAR | Status: DC | PRN
  Administered 2022-08-22 – 2022-08-23 (×2): 25 ug via INTRAVENOUS
  Filled 2022-08-21 (×2): qty 1

## 2022-08-21 MED ORDER — SUCCINYLCHOLINE CHLORIDE 200 MG/10ML IV SOSY
PREFILLED_SYRINGE | INTRAVENOUS | Status: DC | PRN
  Administered 2022-08-21: 120 mg via INTRAVENOUS
  Administered 2022-08-21: 80 mg via INTRAVENOUS

## 2022-08-21 MED ORDER — POTASSIUM CHLORIDE 10 MEQ/100ML IV SOLN
10.0000 meq | INTRAVENOUS | Status: AC
  Administered 2022-08-21 (×2): 10 meq via INTRAVENOUS
  Filled 2022-08-21 (×4): qty 100

## 2022-08-21 MED ORDER — PANTOPRAZOLE SODIUM 40 MG IV SOLR
40.0000 mg | Freq: Two times a day (BID) | INTRAVENOUS | Status: DC
  Administered 2022-08-21 – 2022-08-29 (×16): 40 mg via INTRAVENOUS
  Filled 2022-08-21 (×16): qty 10

## 2022-08-21 MED ORDER — FERROUS SULFATE 300 (60 FE) MG/5ML PO SOLN
300.0000 mg | Freq: Two times a day (BID) | ORAL | Status: DC
  Administered 2022-08-21 – 2022-08-26 (×10): 300 mg
  Filled 2022-08-21 (×12): qty 5

## 2022-08-21 MED ORDER — PHENYLEPHRINE HCL (PRESSORS) 10 MG/ML IV SOLN
INTRAVENOUS | Status: AC
  Filled 2022-08-21: qty 1

## 2022-08-21 MED ORDER — MIDAZOLAM HCL 2 MG/2ML IJ SOLN
1.0000 mg | INTRAMUSCULAR | Status: DC | PRN
  Administered 2022-08-22: 1 mg via INTRAVENOUS
  Administered 2022-08-23: 2 mg via INTRAVENOUS
  Filled 2022-08-21 (×3): qty 2

## 2022-08-21 MED ORDER — DOCUSATE SODIUM 50 MG/5ML PO LIQD: 100.0000 mg | Freq: Two times a day (BID) | ORAL | Status: DC | PRN

## 2022-08-21 MED ORDER — SODIUM CHLORIDE 0.9 % IV SOLN
2.0000 g | Freq: Three times a day (TID) | INTRAVENOUS | Status: DC
  Administered 2022-08-21 – 2022-08-24 (×8): 2 g via INTRAVENOUS
  Filled 2022-08-21 (×8): qty 12.5

## 2022-08-21 MED ORDER — LACTATED RINGERS IV SOLN: INTRAVENOUS | Status: DC | PRN

## 2022-08-21 MED ORDER — LIDOCAINE 2% (20 MG/ML) 5 ML SYRINGE
INTRAMUSCULAR | Status: DC | PRN
  Administered 2022-08-21: 60 mg via INTRAVENOUS

## 2022-08-21 MED ORDER — PROPOFOL 500 MG/50ML IV EMUL
INTRAVENOUS | Status: DC | PRN
  Administered 2022-08-21: 10 ug/kg/min via INTRAVENOUS

## 2022-08-21 MED ORDER — ORAL CARE MOUTH RINSE
15.0000 mL | OROMUCOSAL | Status: DC
  Administered 2022-08-21 – 2022-08-26 (×60): 15 mL via OROMUCOSAL

## 2022-08-21 MED ORDER — FOLIC ACID 1 MG PO TABS
1.0000 mg | ORAL_TABLET | Freq: Every day | ORAL | Status: DC
  Administered 2022-08-22 – 2022-08-26 (×5): 1 mg
  Filled 2022-08-21 (×3): qty 1

## 2022-08-21 MED ORDER — EPHEDRINE SULFATE-NACL 50-0.9 MG/10ML-% IV SOSY
PREFILLED_SYRINGE | INTRAVENOUS | Status: DC | PRN
  Administered 2022-08-21: 5 mg via INTRAVENOUS

## 2022-08-21 MED ORDER — DEXAMETHASONE SODIUM PHOSPHATE 10 MG/ML IJ SOLN
INTRAMUSCULAR | Status: DC | PRN
  Administered 2022-08-21: 10 mg via INTRAVENOUS

## 2022-08-21 MED ORDER — ENSURE ENLIVE PO LIQD
237.0000 mL | Freq: Two times a day (BID) | ORAL | Status: DC
  Administered 2022-08-22 (×2): 237 mL

## 2022-08-21 MED ORDER — SODIUM CHLORIDE 0.9 % IV SOLN
250.0000 mL | INTRAVENOUS | Status: DC
  Administered 2022-08-21 – 2022-08-27 (×3): 250 mL via INTRAVENOUS
  Administered 2022-08-31: 1000 mL via INTRAVENOUS

## 2022-08-21 MED ORDER — ROCURONIUM BROMIDE 100 MG/10ML IV SOLN
INTRAVENOUS | Status: DC | PRN
  Administered 2022-08-21: 100 mg via INTRAVENOUS

## 2022-08-21 MED ORDER — FAMOTIDINE 20 MG PO TABS
20.0000 mg | ORAL_TABLET | Freq: Two times a day (BID) | ORAL | Status: DC
  Administered 2022-08-21 – 2022-08-23 (×4): 20 mg
  Filled 2022-08-21 (×4): qty 1

## 2022-08-21 MED ORDER — PROPOFOL 1000 MG/100ML IV EMUL
0.0000 ug/kg/min | INTRAVENOUS | Status: DC
  Administered 2022-08-21: 50 ug/kg/min via INTRAVENOUS
  Administered 2022-08-22 (×2): 40 ug/kg/min via INTRAVENOUS
  Filled 2022-08-21 (×4): qty 100

## 2022-08-21 MED ORDER — FENTANYL CITRATE (PF) 100 MCG/2ML IJ SOLN
INTRAMUSCULAR | Status: AC
  Filled 2022-08-21: qty 2

## 2022-08-21 MED ORDER — CARVEDILOL 12.5 MG PO TABS
25.0000 mg | ORAL_TABLET | Freq: Two times a day (BID) | ORAL | Status: DC
  Administered 2022-08-22 – 2022-08-23 (×2): 25 mg
  Filled 2022-08-21 (×3): qty 2

## 2022-08-21 MED ORDER — VITAMIN C 500 MG PO TABS
500.0000 mg | ORAL_TABLET | Freq: Every day | ORAL | Status: DC
  Administered 2022-08-22 – 2022-08-27 (×6): 500 mg
  Filled 2022-08-21 (×6): qty 1

## 2022-08-21 MED ORDER — FENTANYL CITRATE (PF) 100 MCG/2ML IJ SOLN
INTRAMUSCULAR | Status: DC | PRN
  Administered 2022-08-21: 25 ug via INTRAVENOUS

## 2022-08-21 MED ORDER — FOLIC ACID 1 MG PO TABS: 1.0000 mg | ORAL_TABLET | Freq: Every day | ORAL | Status: DC

## 2022-08-21 NOTE — Anesthesia Procedure Notes (Signed)
Procedure Name: Intubation Date/Time: 08/21/2022 2:21 PM  Performed by: Genelle Bal, CRNAPre-anesthesia Checklist: Patient identified, Emergency Drugs available, Suction available and Patient being monitored Patient Re-evaluated:Patient Re-evaluated prior to induction Oxygen Delivery Method: Circle system utilized Preoxygenation: Pre-oxygenation with 100% oxygen Induction Type: IV induction Ventilation: Mask ventilation without difficulty Laryngoscope Size: Miller and 2 Grade View: Grade I Tube type: Oral Tube size: 8.0 mm Number of attempts: 1 Airway Equipment and Method: Stylet and Oral airway Placement Confirmation: ETT inserted through vocal cords under direct vision, positive ETCO2 and breath sounds checked- equal and bilateral Secured at: 21 cm Tube secured with: Tape Dental Injury: Teeth and Oropharynx as per pre-operative assessment

## 2022-08-21 NOTE — Anesthesia Postprocedure Evaluation (Signed)
Anesthesia Post Note  Patient: Lonie Peak  Procedure(s) Performed: VIDEO BRONCHOSCOPY WITHOUT FLUORO BRONCHIAL WASHINGS     Patient location during evaluation: ICU Anesthesia Type: General Level of consciousness: patient remains intubated per anesthesia plan Pain management: pain level controlled Vital Signs Assessment: vitals unstable Respiratory status: patient re-intubated, respiratory function unstable and patient on ventilator - see flowsheet for VS Cardiovascular status: blood pressure returned to baseline and stable Anesthetic complications: no Comments: Patient had to be reintubated in endoscopy due to desaturation. Transported to ICU for further treatment.  No notable events documented.  Last Vitals:  Vitals:   08/21/22 1600 08/21/22 1602  BP: 116/61 116/61  Pulse: 72 71  Resp: 17 18  Temp:    SpO2: 100% 100%    Last Pain:  Vitals:   08/21/22 1545  TempSrc: Axillary  PainSc:                  Girl Schissler A.

## 2022-08-21 NOTE — Transfer of Care (Signed)
Immediate Anesthesia Transfer of Care Note  Patient: Lonie Peak  Procedure(s) Performed: VIDEO BRONCHOSCOPY WITHOUT FLUORO BRONCHIAL WASHINGS  Patient Location: ICU  Anesthesia Type:General  Level of Consciousness: sedated, unresponsive, and Patient remains intubated per anesthesia plan  Airway & Oxygen Therapy: Patient re-intubated and Patient placed on Ventilator (see vital sign flow sheet for setting)  Post-op Assessment: Report given to RN and Post -op Vital signs reviewed and stable  Post vital signs: Reviewed and stable  Last Vitals:  Vitals Value Taken Time  BP    Temp    Pulse 74 08/21/22 1533  Resp 21 08/21/22 1533  SpO2 100 % 08/21/22 1533  Vitals shown include unvalidated device data.  Last Pain:  Vitals:   08/21/22 1310  TempSrc: Temporal  PainSc:       Patients Stated Pain Goal: 2 (48/25/00 3704)  Complications: No notable events documented.

## 2022-08-21 NOTE — Op Note (Signed)
Indication : Left atelectasis due to mucous plugging Written informed consent was obtained prior to the procedure from patient's son  The risks of the procedure including coughing, bleeding and the small chance of lung puncture requiring chest tube were discussed in great detail. The benefits & alternatives including serial follow up were also discussed.  Procedures performed under general anesthesia Timeout performed Bronchoscope entered from the ET tube Thick white mucoid secretions suctioned out from ET tube and left mainstem bronchus Trachea & bronchial tree then examined to the subsegmental level.  Moderate inspissated secretions were suctioned out with lavage from left segmental bronchi. No endobronchial lesions noted.  Right-sided airways appeared relatively clear of secretions. BAL obtained from left-sided airways and sent for microbiology Patient was extubated and recovered in endoscopy  Stephanie Benton V. Elsworth Soho MD

## 2022-08-21 NOTE — H&P (View-Only) (Signed)
NAME:  Stephanie Benton, MRN:  [id number], DOB:  Mar 13, 1951, LOS: 3 ADMISSION DATE:  08/18/2022, CONSULTATION DATE:  08/21/22 REFERRING MD:  Louanne Belton, CHIEF COMPLAINT:  pneumonia  History of Present Illness:  Stephanie Benton is a 72 y.o. F with PMH significant for COPD, Bipolar Disorder, HTN, HL, pancreatitis who presented from SNF with increased dyspnea.  She was recently admitted with pneumonia and Covid-19 from 1/9-1/13, she was initially treated with aztreonam, azithromycin and vanc and discharged on omnicef and was on room air .   She returned to the ED on 1/14 with worsening dyspnea and CXR showed opacification of the L hemithorax suggestive of mucous plugging.  She required 2-3L oxygen and labs were significant for WBC of 20k, Hgb 7.3, lactic acid 2.4, procalcitonin 0.61.   She was admitted and started on Vancomycin and Cefepime along with hypertonic saline nebs, IS and chest PT.  A repeat CXR this morning was unchanged, so PCCM consulted.  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   Significant Hospital Events: Including procedures, antibiotic start and stop dates in addition to other pertinent events   1/15 presented with L hemithorax opacification and dyspnea, PCCM consult 1/16 more somnolent with Hgb drop,CXR without improvement in L-sided opacification, transfused 2 units PRBC for hemoglobin 5.5  Interim History / Subjective:   Denies chest pain or dyspnea. Hemoglobin improved to to10 after 2 units PRBC Afebrile Remains on 3 L nasal cannula   Objective   Blood pressure 125/74, pulse 81, temperature 97.9 F (36.6 C), resp. rate 20, height 5\' 5"  (1.651 m), weight 88.8 kg, SpO2 98 %.        Intake/Output Summary (Last 24 hours) at 08/21/2022 1306 Last data filed at 08/21/2022 0100 Gross per 24 hour  Intake 1936 ml  Output 400 ml  Net 1536 ml   Filed  Weights   08/18/22 1753 08/19/22 1835  Weight: 73.9 kg 88.8 kg   General:  thin, elderly womann no acute distress HEENT: MM pink/moist, sclera anicteric Neuro: somnolent, awakes easily and interacts, oriented to place and person CV: s1s2 rrr, no m/r/g PULM:  minimal air entry L-side, no crackles or wheezing, no accessory muscle use or tachypnea on [place] GI: soft, non-distended  Extremities: warm/dry, no edema  Skin: no rashes or lesions  Labs show hemoglobin improved to 10, potassium 2.9, magnesium 2.1.  Resolved Hospital Problem list     Assessment & Plan:   L hemithorax opacification and possible infiltrate resulting in acute hypoxic respiratory failure Discharged 1/13 for CAP and Covid on Omnicef and re-admitted one day later Strep pneumo and legionella were negative 5 days ago, covid +, RVP neg -most likely secondary to mucous plugging, -Has not improved with conservative therapy with hypertonic saline nebs, aggressive chest PT  -Plan for bronchoscopy today -NPO -continue broad spectrum abx   Acute Encephalopathy Pt is more somnolent, though vital signs are stable and she is protecting her airway -spoke with patient's son, states that at baseline she is up all night and sleeps during the day, there has been recent concern for dementia.  She will normally interact in the evenings and at night   Acute Anemia Hgb drop from 7.3 to 5.5 >> 2 U PRBC >> 10  No evidence of overt bleeding and no large effusion with POCUS to suggest hemothorax - follow CBC and monitor for signs of  bleeding   I discussed risks and benefits of the procedure with the patient.  Consent will be obtained from her son due to her fluctuating mental status    Best Practice (right click and "Reselect all SmartList Selections" daily)   Per primary  Labs   CBC: Recent Labs  Lab 08/15/22 0800 08/16/22 0540 08/18/22 2025 08/19/22 0500 08/20/22 0510 08/20/22 1427 08/21/22 0922  WBC 15.9* 11.8*  20.1* 20.9* 10.7* 8.3 8.4  NEUTROABS 13.1* 9.0* 15.5*  --   --   --   --   HGB 7.7* 7.4* 7.7* 7.3* 5.5* 5.5* 10.0*  HCT 23.5* 22.4* 24.9* 23.3* 19.4* 18.3* 31.3*  MCV 89.4 87.5 89.2 90.7 96.5 93.4 88.9  PLT 346 308 376 331 228 204 226     Basic Metabolic Panel: Recent Labs  Lab 08/15/22 0800 08/16/22 0540 08/17/22 0141 08/18/22 2025 08/19/22 0500 08/20/22 0510 08/21/22 0922  NA 145 144 143 140 144 145 143  K 3.5 3.4* 3.6 3.0* 3.1* 3.4* 2.9*  CL 118* 117* 116* 115* 116* 121* 116*  CO2 20* 20* 20* 17* 19* 19* 22  GLUCOSE 81 83 87 97 90 116* 83  BUN 10 <5* 5* 16 15 11 8   CREATININE 0.94 0.86 0.76 0.91 0.88 0.78 0.77  CALCIUM 8.2* 8.0* 8.3* 8.0* 8.0* 7.7* 8.2*  MG 1.9 1.8  --   --   --  1.6* 2.1  PHOS 2.7 2.5  --   --   --   --   --     GFR: Estimated Creatinine Clearance: 69.9 mL/min (by C-G formula based on SCr of 0.77 mg/dL). Recent Labs  Lab 08/15/22 0800 08/16/22 0540 08/18/22 2025 08/19/22 0030 08/19/22 0500 08/20/22 0510 08/20/22 1427 08/21/22 0922  PROCALCITON 2.46  --   --   --  0.61  --   --   --   WBC 15.9*   < > 20.1*  --  20.9* 10.7* 8.3 8.4  LATICACIDVEN  --   --  2.4* 1.7  --   --   --   --    < > = values in this interval not displayed.     Liver Function Tests: Recent Labs  Lab 08/15/22 0800 08/16/22 0540 08/18/22 2025 08/19/22 0500  AST 16 17 25 20   ALT 14 13 16 14   ALKPHOS 109 102 91 77  BILITOT 0.8 0.9 0.7 0.9  PROT 5.3* 5.0* 5.8* 5.0*  ALBUMIN 2.0* 1.9* 2.2* 2.0*    No results for input(s): "LIPASE", "AMYLASE" in the last 168 hours.  No results for input(s): "AMMONIA" in the last 168 hours.  ABG    Component Value Date/Time   PHART 7.52 (H) 08/20/2022 1440   PCO2ART 24 (L) 08/20/2022 1440   PO2ART 155 (H) 08/20/2022 1440   HCO3 19.6 (L) 08/20/2022 1440   TCO2 23 08/13/2022 1417   ACIDBASEDEF 1.6 08/20/2022 1440   O2SAT 98.3 08/20/2022 1440     Coagulation Profile: Recent Labs  Lab 08/18/22 2025 08/19/22 0500  INR  1.2 1.3*     Cardiac Enzymes: No results for input(s): "CKTOTAL", "CKMB", "CKMBINDEX", "TROPONINI" in the last 168 hours.   HbA1C: No results found for: "HGBA1C"  CBG: Recent Labs  Lab 08/15/22 0758 08/15/22 1118 08/15/22 1743 08/15/22 2138 08/18/22 1750  GLUCAP 92 93 95 99 84     Sharry Beining V. 10/14/22 MD [facility] See Amion for pager If no response to pager , please call [phone]  until 7pm After 7:00 pm call Elink  [phone]?[place]

## 2022-08-21 NOTE — Progress Notes (Signed)
[facility] Progress Note Patient Name: Stephanie Benton DOB: January 17, 1951 MRN: [id number]   Date of Service  08/21/2022  HPI/Events of Note  BP 89/55, HR 87, serum albumin 1.9 gm / dl.  eICU Interventions  Will order 250 ml of 5 % Albumin x 1, and if hypotension persists will start peripheral Norepinephrine gtt.        [name] [name] 08/21/2022, 10:01 PM

## 2022-08-21 NOTE — Progress Notes (Addendum)
Extubated and endoscopy room. Vomiting x 1 and this was suctioned out by the CRNA Developed hypoxia and agitation.  Recovered by bagging and placed on full facemask. Reintubated by anesthesia Will transfer to ICU for acute respiratory failure and further monitoring Updated son [name] to events.  Leanna Sato Elsworth Soho MD

## 2022-08-21 NOTE — Progress Notes (Signed)
NAME:  Stephanie Benton, MRN:  [id number], DOB:  Mar 13, 1951, LOS: 3 ADMISSION DATE:  08/18/2022, CONSULTATION DATE:  08/21/22 REFERRING MD:  Louanne Belton, CHIEF COMPLAINT:  pneumonia  History of Present Illness:  Stephanie Benton is a 72 y.o. F with PMH significant for COPD, Bipolar Disorder, HTN, HL, pancreatitis who presented from SNF with increased dyspnea.  She was recently admitted with pneumonia and Covid-19 from 1/9-1/13, she was initially treated with aztreonam, azithromycin and vanc and discharged on omnicef and was on room air .   She returned to the ED on 1/14 with worsening dyspnea and CXR showed opacification of the L hemithorax suggestive of mucous plugging.  She required 2-3L oxygen and labs were significant for WBC of 20k, Hgb 7.3, lactic acid 2.4, procalcitonin 0.61.   She was admitted and started on Vancomycin and Cefepime along with hypertonic saline nebs, IS and chest PT.  A repeat CXR this morning was unchanged, so PCCM consulted.  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   Significant Hospital Events: Including procedures, antibiotic start and stop dates in addition to other pertinent events   1/15 presented with L hemithorax opacification and dyspnea, PCCM consult 1/16 more somnolent with Hgb drop,CXR without improvement in L-sided opacification, transfused 2 units PRBC for hemoglobin 5.5  Interim History / Subjective:   Denies chest pain or dyspnea. Hemoglobin improved to to10 after 2 units PRBC Afebrile Remains on 3 L nasal cannula   Objective   Blood pressure 125/74, pulse 81, temperature 97.9 F (36.6 C), resp. rate 20, height 5\' 5"  (1.651 m), weight 88.8 kg, SpO2 98 %.        Intake/Output Summary (Last 24 hours) at 08/21/2022 1306 Last data filed at 08/21/2022 0100 Gross per 24 hour  Intake 1936 ml  Output 400 ml  Net 1536 ml   Filed  Weights   08/18/22 1753 08/19/22 1835  Weight: 73.9 kg 88.8 kg   General:  thin, elderly womann no acute distress HEENT: MM pink/moist, sclera anicteric Neuro: somnolent, awakes easily and interacts, oriented to place and person CV: s1s2 rrr, no m/r/g PULM:  minimal air entry L-side, no crackles or wheezing, no accessory muscle use or tachypnea on [place] GI: soft, non-distended  Extremities: warm/dry, no edema  Skin: no rashes or lesions  Labs show hemoglobin improved to 10, potassium 2.9, magnesium 2.1.  Resolved Hospital Problem list     Assessment & Plan:   L hemithorax opacification and possible infiltrate resulting in acute hypoxic respiratory failure Discharged 1/13 for CAP and Covid on Omnicef and re-admitted one day later Strep pneumo and legionella were negative 5 days ago, covid +, RVP neg -most likely secondary to mucous plugging, -Has not improved with conservative therapy with hypertonic saline nebs, aggressive chest PT  -Plan for bronchoscopy today -NPO -continue broad spectrum abx   Acute Encephalopathy Pt is more somnolent, though vital signs are stable and she is protecting her airway -spoke with patient's son, states that at baseline she is up all night and sleeps during the day, there has been recent concern for dementia.  She will normally interact in the evenings and at night   Acute Anemia Hgb drop from 7.3 to 5.5 >> 2 U PRBC >> 10  No evidence of overt bleeding and no large effusion with POCUS to suggest hemothorax - follow CBC and monitor for signs of  bleeding   I discussed risks and benefits of the procedure with the patient.  Consent will be obtained from her son due to her fluctuating mental status    Best Practice (right click and "Reselect all SmartList Selections" daily)   Per primary  Labs   CBC: Recent Labs  Lab 08/15/22 0800 08/16/22 0540 08/18/22 2025 08/19/22 0500 08/20/22 0510 08/20/22 1427 08/21/22 0922  WBC 15.9* 11.8*  20.1* 20.9* 10.7* 8.3 8.4  NEUTROABS 13.1* 9.0* 15.5*  --   --   --   --   HGB 7.7* 7.4* 7.7* 7.3* 5.5* 5.5* 10.0*  HCT 23.5* 22.4* 24.9* 23.3* 19.4* 18.3* 31.3*  MCV 89.4 87.5 89.2 90.7 96.5 93.4 88.9  PLT 346 308 376 331 228 204 226     Basic Metabolic Panel: Recent Labs  Lab 08/15/22 0800 08/16/22 0540 08/17/22 0141 08/18/22 2025 08/19/22 0500 08/20/22 0510 08/21/22 0922  NA 145 144 143 140 144 145 143  K 3.5 3.4* 3.6 3.0* 3.1* 3.4* 2.9*  CL 118* 117* 116* 115* 116* 121* 116*  CO2 20* 20* 20* 17* 19* 19* 22  GLUCOSE 81 83 87 97 90 116* 83  BUN 10 <5* 5* 16 15 11 8  CREATININE 0.94 0.86 0.76 0.91 0.88 0.78 0.77  CALCIUM 8.2* 8.0* 8.3* 8.0* 8.0* 7.7* 8.2*  MG 1.9 1.8  --   --   --  1.6* 2.1  PHOS 2.7 2.5  --   --   --   --   --     GFR: Estimated Creatinine Clearance: 69.9 mL/min (by C-G formula based on SCr of 0.77 mg/dL). Recent Labs  Lab 08/15/22 0800 08/16/22 0540 08/18/22 2025 08/19/22 0030 08/19/22 0500 08/20/22 0510 08/20/22 1427 08/21/22 0922  PROCALCITON 2.46  --   --   --  0.61  --   --   --   WBC 15.9*   < > 20.1*  --  20.9* 10.7* 8.3 8.4  LATICACIDVEN  --   --  2.4* 1.7  --   --   --   --    < > = values in this interval not displayed.     Liver Function Tests: Recent Labs  Lab 08/15/22 0800 08/16/22 0540 08/18/22 2025 08/19/22 0500  AST 16 17 25 20  ALT 14 13 16 14  ALKPHOS 109 102 91 77  BILITOT 0.8 0.9 0.7 0.9  PROT 5.3* 5.0* 5.8* 5.0*  ALBUMIN 2.0* 1.9* 2.2* 2.0*    No results for input(s): "LIPASE", "AMYLASE" in the last 168 hours.  No results for input(s): "AMMONIA" in the last 168 hours.  ABG    Component Value Date/Time   PHART 7.52 (H) 08/20/2022 1440   PCO2ART 24 (L) 08/20/2022 1440   PO2ART 155 (H) 08/20/2022 1440   HCO3 19.6 (L) 08/20/2022 1440   TCO2 23 08/13/2022 1417   ACIDBASEDEF 1.6 08/20/2022 1440   O2SAT 98.3 08/20/2022 1440     Coagulation Profile: Recent Labs  Lab 08/18/22 2025 08/19/22 0500  INR  1.2 1.3*     Cardiac Enzymes: No results for input(s): "CKTOTAL", "CKMB", "CKMBINDEX", "TROPONINI" in the last 168 hours.   HbA1C: No results found for: "HGBA1C"  CBG: Recent Labs  Lab 08/15/22 0758 08/15/22 1118 08/15/22 1743 08/15/22 2138 08/18/22 1750  GLUCAP 92 93 95 99 84     Brekyn Huntoon V. Lorain Keast MD [facility] See Amion for pager If no response to pager , please call [phone]   until 7pm After 7:00 pm call Elink  [phone]?[place]

## 2022-08-21 NOTE — Interval H&P Note (Signed)
History and Physical Interval Note:  08/21/2022 1:31 PM  Stephanie Benton  has presented today for surgery, with the diagnosis of unknown.  The various methods of treatment have been discussed with the patient and family. After consideration of risks, benefits and other options for treatment, the patient has consented to  Procedure(s): VIDEO BRONCHOSCOPY WITHOUT FLUORO (N/A) as a surgical intervention.  The patient's history has been reviewed, patient examined, no change in status, stable for surgery.  I have reviewed the patient's chart and labs.  Questions were answered to the patient's satisfaction.     Leanna Sato Elsworth Soho

## 2022-08-21 NOTE — Progress Notes (Signed)
Pt presented to endoscopy today for bronchoscopy with MD Elsworth Soho. Pt reintubated post-operatively and transferred to ICU. See physician and anesthesia notes. Candie Mile, RN on [facility] notified. Report called to Lissa Morales, RN in ICU.  Debarah Crape, RN 08/21/22

## 2022-08-21 NOTE — Anesthesia Preprocedure Evaluation (Addendum)
Anesthesia Evaluation  Patient identified by MRN, date of birth, ID band Patient awake and Patient confused    Reviewed: Allergy & Precautions, NPO status , Patient's Chart, lab work & pertinent test results, reviewed documented beta blocker date and time   Airway Mallampati: III  TM Distance: >3 FB     Dental  (+) Edentulous Upper, Edentulous Lower   Pulmonary pneumonia, unresolved, COPD,  COPD inhaler and oxygen dependent, Current SmokerPatient did not abstain from smoking. Covid 19 Day 9 Mucous plugging with opacification of left hemithorax    + rhonchi  + decreased breath sounds      Cardiovascular hypertension, Pt. on medications and Pt. on home beta blockers  Rhythm:Regular Rate:Tachycardia  Echo 05/17/22 1. Left ventricular ejection fraction, by estimation, is 60 to 65%. The  left ventricle has normal function. The left ventricle has no regional  wall motion abnormalities. There is mild left ventricular hypertrophy.  Left ventricular diastolic parameters  are consistent with Grade I diastolic dysfunction (impaired relaxation).   2. Right ventricular systolic function is normal. The right ventricular  size is normal.   3. The mitral valve is normal in structure. No evidence of mitral valve  regurgitation.   4. There is mild calcification of the aortic valve. Aortic valve  regurgitation is not visualized.   5. The inferior vena cava is normal in size with greater than 50%  respiratory variability, suggesting right atrial pressure of 3 mmHg.   EKG 08/18/21 Sinus tachycardia 126/min, baseline artifact     Neuro/Psych  PSYCHIATRIC DISORDERS Anxiety Depression Bipolar Disorder    Neuromuscular disease    GI/Hepatic Neg liver ROS,GERD  Medicated,,  Endo/Other  negative endocrine ROS    Renal/GU Renal disease  negative genitourinary   Musculoskeletal  (+) Arthritis , Osteoarthritis,    Abdominal   Peds   Hematology  (+) Blood dyscrasia, anemia   Anesthesia Other Findings   Reproductive/Obstetrics                             Anesthesia Physical Anesthesia Plan  ASA: 4  Anesthesia Plan: General   Post-op Pain Management: Minimal or no pain anticipated   Induction: Intravenous  PONV Risk Score and Plan: 1 and Treatment may vary due to age or medical condition and Propofol infusion  Airway Management Planned: Oral ETT  Additional Equipment: None  Intra-op Plan:   Post-operative Plan: Extubation in OR  Informed Consent: I have reviewed the patients History and Physical, chart, labs and discussed the procedure including the risks, benefits and alternatives for the proposed anesthesia with the patient or authorized representative who has indicated his/her understanding and acceptance.    Discussed DNR with patient and Suspend DNR.     Plan Discussed with: Anesthesiologist and CRNA  Anesthesia Plan Comments:         Anesthesia Quick Evaluation

## 2022-08-21 NOTE — Anesthesia Procedure Notes (Signed)
Procedure Name: Intubation Date/Time: 08/21/2022 1:31 PM  Performed by: Genelle Bal, CRNAPre-anesthesia Checklist: Patient identified, Emergency Drugs available, Suction available and Patient being monitored Patient Re-evaluated:Patient Re-evaluated prior to induction Oxygen Delivery Method: Circle system utilized Preoxygenation: Pre-oxygenation with 100% oxygen Induction Type: IV induction Ventilation: Mask ventilation without difficulty Laryngoscope Size: Miller and 2 Grade View: Grade I Tube type: Oral Tube size: 9.0 mm Number of attempts: 1 Airway Equipment and Method: Stylet and Oral airway Placement Confirmation: ETT inserted through vocal cords under direct vision, positive ETCO2 and breath sounds checked- equal and bilateral Secured at: 20 cm Tube secured with: Tape Dental Injury: Teeth and Oropharynx as per pre-operative assessment

## 2022-08-21 NOTE — Progress Notes (Signed)
Pharmacy Antibiotic Note  Stephanie Benton is a 72 y.o. female admitted on 08/18/2022 with  medical history significant of Essential hypertension, hyperlipidemia, bipolar disorder, AKI, chronic abdominal pain, chronic pancreatitis, esophagitis tobacco abuse, COPD, who was just discharged today to skilled nursing facility after admission with severe sepsis as a result of community-acquired pneumonia and COVID-19 infection diagnosed on January 9.  She was brought back secondary to worsening shortness of breath and hypoxia .  Pharmacy has been consulted to dose vancomycin and cefepime for pna.  1st doses given in the ED  Plan: Continue Vancomycin 1250mg  IV q24h (AUC 509.2, Scr 0.91) Adjust Cefepime 2gm to 2 gr IV q8h  Follow renal function, cultures and clinical course  Height: 5\' 5"  (165.1 cm) Weight: 88.8 kg (195 lb 12.3 oz) IBW/kg (Calculated) : 57  Temp (24hrs), Avg:97.9 F (36.6 C), Min:97.6 F (36.4 C), Max:98.2 F (36.8 C)  Recent Labs  Lab 08/17/22 0141 08/18/22 2025 08/19/22 0030 08/19/22 0500 08/20/22 0510 08/20/22 1427 08/21/22 0922  WBC  --  20.1*  --  20.9* 10.7* 8.3 8.4  CREATININE 0.76 0.91  --  0.88 0.78  --  0.77  LATICACIDVEN  --  2.4* 1.7  --   --   --   --      Estimated Creatinine Clearance: 69.9 mL/min (by C-G formula based on SCr of 0.77 mg/dL).    Allergies  Allergen Reactions   Fish Allergy Anaphylaxis   Morphine And Related Anaphylaxis and Swelling   Belsomra [Suvorexant] Other (See Comments)    Nightmares     Codeine Nausea And Vomiting   Duragesic-100 [Fentanyl] Hives   Motrin [Ibuprofen] Nausea Only and Other (See Comments)    History of stomach ulcers   Nsaids Other (See Comments)    History of stomach ulcers   Penicillins Hives    Did it involve swelling of the face/tongue/throat, SOB, or low BP? Yes Did it involve sudden or severe rash/hives, skin peeling, or any reaction on the inside of your mouth or nose? Yes Did you need to seek medical  attention at a hospital or doctor's office? Yes When did it last happen? More than 10 years ago If all above answers are "NO", may proceed with cephalosporin use.    Sulfa Antibiotics Swelling    Not listed on MAR   Tolectin [Tolmetin] Other (See Comments)    History of stomach ulcers Not listed on MAR    Antimicrobials this admission: 1/14 cefepime >> 1/14 vancomycin >>   Dose adjustments this admission:     Microbiology results: 1/14 BCx: ngtd 1/15 MRSA PCR: positive  1/15 resp panel: negative      Thank you for allowing pharmacy to be a part of this patient's care.   Royetta Asal, PharmD, BCPS 08/21/2022 11:40 AM

## 2022-08-21 NOTE — Progress Notes (Addendum)
PROGRESS NOTE    Stephanie Benton  KDX:[id number] DOB: 07-23-1951 DOA: 08/18/2022 PCP: [name], MD    Brief Narrative:  Stephanie Benton is a 72 y.o. female with past medical history of hypertension, hyperlipidemia, bipolar disorder,  chronic abdominal pain, chronic pancreatitis, COPD who was discharged to skilled nursing facility on 08/18/2022 after being admitted for severe sepsis from community-acquired pneumonia and COVID-19 infection diagnosed on August 13, 2022.  Patient was brought back secondary to worsening shortness of breath and hypoxia.  Chest x-ray in the ER showed left-sided whiteout consistent with atelectasis.  This was most likely mucous plugging.  There is was evidence of volume loss with tracheal deviation to the left.    Patient was initially tachycardic with leukocytosis and pulse ox was 91% on 3 L of oxygen. Patient was then readmitted to hospital for further evaluation and treatment.  During hospitalization, pulmonary was consulted and initially conservative treatment was initiated.  Despite conservative treatment atelectasis did not improve so plan is bronchoscopy on 08/21/2022.  Patient was sent from skilled nursing facility and will likely need to go to skilled nursing facility on discharge.  Assessment and plan.  Severe evere sepsis due to pneumonia:  Patient has been started on IV vancomycin and cefepime for healthcare associated pneumonia.  Patient had significant leukocytosis on presentation WBC today at 8.4, temperature max of 98.51F.  Currently on 3 L of nasal cannula oxygen.  Respiratory viral panel negative.  MRSA PCR positive.   Blood cultures negative in 12 hours.  Left lung mucous plugging with lung collapse Pulmonary was consulted and recommendations include hypertonic saline nebs,  incentive spirometry and flutter valve with manual chest physiotherapy.   Repeat chest x-ray from 08/20/2022 without any change with complete opacification.  Bedside ultrasound  without any significant effusion.  Pulmonary plan for flexible bronchoscopy 08/21/2022.  Recent COVID-19 infection: Diagnosed on August 13, 2022.  Day 9 today.  Continue airborne precautions.   Acute kidney injury: Creatinine at 0.7.  Improved.  Check BMP in AM.   Hypokalemia: Continue to hold diuretics.  Potassium today at 2.9.  Will continue to replenish aggressively with IV potassium.  Give 40 mEq of IV potassium.  Normocytic anemia: Hemoglobin today at 10.0 after 2 units of packed RBC for hemoglobin of 5.5.  No obvious bleeding reported as per the nursing staff or patient.  Off Lovenox at this time.  Bipolar disorder: In full remission.     Tobacco abuse: Continue nicotine patch.      DVT prophylaxis: Place and maintain sequential compression device Start: 08/20/22 0802.  Will DC Lovenox.   Code Status:     Code Status: DNR  Disposition: Skilled nursing facility likely in 2 to 3 days, follow pulmonary recommendations.  Status is: Inpatient  Remains inpatient appropriate because: Healthcare-associated pneumonia, left hemithorax opacification and collapse, need for bronchoscopy..   Family Communication:  Spoke with the patient's son at bedside on 08/19/2022  Consultants:  Pulmonary  Procedures:  Manual chest physical therapy. PRBC 2 units of transfusion.  Antimicrobials:  Vancomycin and cefepime IV.  Anti-infectives (From admission, onward)    Start     Dose/Rate Route Frequency Ordered Stop   08/19/22 2200  vancomycin (VANCOREADY) IVPB 1250 mg/250 mL        1,250 mg 166.7 mL/hr over 90 Minutes Intravenous Every 24 hours 08/18/22 2230     08/19/22 1000  ceFEPIme (MAXIPIME) 2 g in sodium chloride 0.9 % 100 mL IVPB  2 g 200 mL/hr over 30 Minutes Intravenous Every 12 hours 08/18/22 2230     08/19/22 0500  vancomycin (VANCOCIN) IVPB 1000 mg/200 mL premix  Status:  Discontinued        1,000 mg 200 mL/hr over 60 Minutes Intravenous  Once 08/19/22 0456 08/19/22 0457    08/19/22 0500  ceFEPIme (MAXIPIME) 2 g in sodium chloride 0.9 % 100 mL IVPB  Status:  Discontinued        2 g 200 mL/hr over 30 Minutes Intravenous  Once 08/19/22 0456 08/19/22 0458   08/18/22 2115  vancomycin (VANCOREADY) IVPB 1500 mg/300 mL        1,500 mg 150 mL/hr over 120 Minutes Intravenous  Once 08/18/22 2106 08/19/22 0033   08/18/22 2045  vancomycin (VANCOCIN) IVPB 1000 mg/200 mL premix  Status:  Discontinued        1,000 mg 200 mL/hr over 60 Minutes Intravenous  Once 08/18/22 2044 08/18/22 2105   08/18/22 2045  ceFEPIme (MAXIPIME) 2 g in sodium chloride 0.9 % 100 mL IVPB        2 g 200 mL/hr over 30 Minutes Intravenous  Once 08/18/22 2044 08/18/22 2210      Subjective: Today, patient was seen and examined at bedside.  Denies pain, nausea, vomiting, fever, chills or rigor.  Has mild cough and shortness of breath but does not feel worse than yesterday. Objective: Vitals:   08/20/22 2123 08/20/22 2134 08/21/22 0500 08/21/22 0750  BP: (!) 133/109 (!) 125/98 125/74   Pulse: 78 84 81   Resp: 16 (!) 22 20   Temp: 98.2 F (36.8 C) 98.2 F (36.8 C) 97.9 F (36.6 C)   TempSrc: Oral Axillary    SpO2: 100% 100% 100% 98%  Weight:      Height:        Intake/Output Summary (Last 24 hours) at 08/21/2022 1058 Last data filed at 08/21/2022 0100 Gross per 24 hour  Intake 1936 ml  Output 400 ml  Net 1536 ml   Filed Weights   08/18/22 1753 08/19/22 1835  Weight: 73.9 kg 88.8 kg    Physical Examination: Body mass index is 32.58 kg/m.   General: Alert awake and Communicative, on nasal cannula oxygen, appears chronically ill and deconditioned, obese built.  HENT:   No scleral pallor or icterus noted. Oral mucosa is moist.  Chest: Diminished breath sounds left side. CVS: S1 &S2 heard. No murmur.  Regular rate and rhythm.  Abdomen: Soft, nontender, nondistended.  Bowel sounds are heard.   Extremities: No cyanosis, clubbing or edema.  Peripheral pulses are palpable. Psych:  Alert, awake and mildly Communicative, CNS:  No cranial nerve deficits.  Communicative.  Weakness mostly on the left side. Skin: Warm and dry.  No rashes noted.  Data Reviewed:   CBC: Recent Labs  Lab 08/15/22 0800 08/16/22 0540 08/18/22 2025 08/19/22 0500 08/20/22 0510 08/20/22 1427 08/21/22 0922  WBC 15.9* 11.8* 20.1* 20.9* 10.7* 8.3 8.4  NEUTROABS 13.1* 9.0* 15.5*  --   --   --   --   HGB 7.7* 7.4* 7.7* 7.3* 5.5* 5.5* 10.0*  HCT 23.5* 22.4* 24.9* 23.3* 19.4* 18.3* 31.3*  MCV 89.4 87.5 89.2 90.7 96.5 93.4 88.9  PLT 346 308 376 331 228 204 226     Basic Metabolic Panel: Recent Labs  Lab 08/15/22 0800 08/16/22 0540 08/17/22 0141 08/18/22 2025 08/19/22 0500 08/20/22 0510 08/21/22 0922  NA 145 144 143 140 144 145 143  K 3.5 3.4* 3.6  3.0* 3.1* 3.4* 2.9*  CL 118* 117* 116* 115* 116* 121* 116*  CO2 20* 20* 20* 17* 19* 19* 22  GLUCOSE 81 83 87 97 90 116* 83  BUN 10 <5* 5* 16 15 11 8   CREATININE 0.94 0.86 0.76 0.91 0.88 0.78 0.77  CALCIUM 8.2* 8.0* 8.3* 8.0* 8.0* 7.7* 8.2*  MG 1.9 1.8  --   --   --  1.6* 2.1  PHOS 2.7 2.5  --   --   --   --   --      Liver Function Tests: Recent Labs  Lab 08/15/22 0800 08/16/22 0540 08/18/22 2025 08/19/22 0500  AST 16 17 25 20   ALT 14 13 16 14   ALKPHOS 109 102 91 77  BILITOT 0.8 0.9 0.7 0.9  PROT 5.3* 5.0* 5.8* 5.0*  ALBUMIN 2.0* 1.9* 2.2* 2.0*      Radiology Studies: DG CHEST PORT 1 VIEW  Result Date: 08/20/2022 CLINICAL DATA:  Pneumonia.  COVID-19. EXAM: PORTABLE CHEST 1 VIEW COMPARISON:  August 19, 2022 FINDINGS: The left hemithorax is completely opacified. The tiny amount of aerated lung in the left upper lobe on the previous study is now opacified. The cardiomediastinal silhouette is not well assessed due to left lung opacification. No obvious abnormality. No pneumothorax on the right. No focal infiltrate on the right. No overt edema. IMPRESSION: The left hemithorax is completely opacified. The tiny amount of aerated  lung in the left upper lobe on the previous study is now opacified. The findings are concerning for a large pleural effusion with underlying atelectasis or infiltrate. Electronically Signed   By: Dorise Bullion III M.D.   On: 08/20/2022 07:56      LOS: 3 days    Flora Lipps, MD [facility] Available via Epic secure chat 7am-7pm After these hours, please refer to coverage provider listed on [email or web address] 08/21/2022, 10:58 AM

## 2022-08-22 ENCOUNTER — Inpatient Hospital Stay (HOSPITAL_COMMUNITY): Payer: 59

## 2022-08-22 DIAGNOSIS — J9601 Acute respiratory failure with hypoxia: Secondary | ICD-10-CM | POA: Diagnosis not present

## 2022-08-22 DIAGNOSIS — G9340 Encephalopathy, unspecified: Secondary | ICD-10-CM | POA: Diagnosis not present

## 2022-08-22 DIAGNOSIS — J9811 Atelectasis: Secondary | ICD-10-CM | POA: Diagnosis not present

## 2022-08-22 LAB — BASIC METABOLIC PANEL
Anion gap: 7 (ref 5–15)
BUN: 10 mg/dL (ref 8–23)
CO2: 20 mmol/L — ABNORMAL LOW (ref 22–32)
Calcium: 8.3 mg/dL — ABNORMAL LOW (ref 8.9–10.3)
Chloride: 115 mmol/L — ABNORMAL HIGH (ref 98–111)
Creatinine, Ser: 0.87 mg/dL (ref 0.44–1.00)
GFR, Estimated: >60 mL/min (ref >60)
Glucose, Bld: 109 mg/dL — ABNORMAL HIGH (ref 70–99)
Potassium: 3 mmol/L — ABNORMAL LOW (ref 3.5–5.1)
Sodium: 142 mmol/L (ref 135–145)

## 2022-08-22 LAB — CBC
HCT: 29.5 % — ABNORMAL LOW (ref 36.0–46.0)
HCT: 29.2 % — ABNORMAL LOW (ref 36.0–46.0)
Hemoglobin: 9.5 g/dL — ABNORMAL LOW (ref 12.0–15.0)
Hemoglobin: 9.3 g/dL — ABNORMAL LOW (ref 12.0–15.0)
MCH: 29 pg (ref 26.0–34.0)
MCH: 28.7 pg (ref 26.0–34.0)
MCHC: 32.2 g/dL (ref 30.0–36.0)
MCHC: 31.8 g/dL (ref 30.0–36.0)
MCV: 89.9 fL (ref 80.0–100.0)
MCV: 90.1 fL (ref 80.0–100.0)
Platelets: 211 10*3/uL (ref 150–400)
Platelets: 201 10*3/uL (ref 150–400)
RBC: 3.28 MIL/uL — ABNORMAL LOW (ref 3.87–5.11)
RBC: 3.24 MIL/uL — ABNORMAL LOW (ref 3.87–5.11)
RDW: 17.3 % — ABNORMAL HIGH (ref 11.5–15.5)
RDW: 17.7 % — ABNORMAL HIGH (ref 11.5–15.5)
WBC: 9.9 10*3/uL (ref 4.0–10.5)
WBC: 12.2 10*3/uL — ABNORMAL HIGH (ref 4.0–10.5)
nRBC: 0.5 % — ABNORMAL HIGH (ref 0.0–0.2)
nRBC: 0.6 % — ABNORMAL HIGH (ref 0.0–0.2)

## 2022-08-22 LAB — TRIGLYCERIDES: Triglycerides: 81 mg/dL (ref <150)

## 2022-08-22 LAB — MAGNESIUM: Magnesium: 2.2 mg/dL (ref 1.7–2.4)

## 2022-08-22 MED ORDER — ENOXAPARIN SODIUM 30 MG/0.3ML IJ SOSY: 30.0000 mg | PREFILLED_SYRINGE | Freq: Two times a day (BID) | INTRAMUSCULAR | Status: DC

## 2022-08-22 MED ORDER — SODIUM CHLORIDE 0.9 % IV BOLUS
500.0000 mL | Freq: Once | INTRAVENOUS | Status: AC
  Administered 2022-08-22: 500 mL via INTRAVENOUS

## 2022-08-22 MED ORDER — DEXMEDETOMIDINE HCL IN NACL 200 MCG/50ML IV SOLN
0.0000 ug/kg/h | INTRAVENOUS | Status: DC
  Administered 2022-08-22: 0.5 ug/kg/h via INTRAVENOUS
  Administered 2022-08-22 (×2): 0.4 ug/kg/h via INTRAVENOUS
  Administered 2022-08-22: 0.5 ug/kg/h via INTRAVENOUS
  Administered 2022-08-23 (×2): 0.6 ug/kg/h via INTRAVENOUS
  Administered 2022-08-23: 0.4 ug/kg/h via INTRAVENOUS
  Filled 2022-08-22 (×6): qty 50

## 2022-08-22 MED ORDER — POTASSIUM CHLORIDE 10 MEQ/100ML IV SOLN
10.0000 meq | INTRAVENOUS | Status: AC
  Administered 2022-08-22 (×4): 10 meq via INTRAVENOUS
  Filled 2022-08-22 (×4): qty 100

## 2022-08-22 MED ORDER — POTASSIUM CHLORIDE 20 MEQ PO PACK
20.0000 meq | PACK | ORAL | Status: AC
  Administered 2022-08-22 (×2): 20 meq
  Filled 2022-08-22 (×2): qty 1

## 2022-08-22 MED ORDER — MUPIROCIN 2 % EX OINT
1.0000 | TOPICAL_OINTMENT | Freq: Two times a day (BID) | CUTANEOUS | Status: AC
  Administered 2022-08-22 – 2022-08-26 (×10): 1 via NASAL
  Filled 2022-08-22: qty 22

## 2022-08-22 NOTE — Consult Note (Signed)
Reason for Consult: Melena and Anemia Referring Physician: CCM  Lonie Peak HPI: This is a 72 year old female with a PMH of an LA Grade D esophagitis, COPD, Bipolar disorder, HTN, and hyperlipidemia admitted for mucous plugging.  The patient initially presented on 08/13/2022 for a COVID infection and he was discharged on 1/13, but he was readmitted on 1/14 for mucous plugging of her LLL.  Her oxygen requirement increased and she underwent a bronchoscopy to clear the plugging, however, after the procedure she was not able to be weaned off of the vent.  Her HGB on 08/18/2022 was at 7.7 g/dL, but it declined down to 5.5 g/dL on 08/20/2022.  There was report of melenic stool.  She was transfused up to 10.0 g/dL and she equilibrated at 9.5 g/dL.  There was an episode of hypotension, but she responded to albumin.  Since that time the patient remained hemodynamically stable.  She had two EGDs last year on 05/17/2022 and 07/12/2022 with findings of an LA Grade B esophagitis and an LA Grade D esophagitis, respectively.  During this hospitalization she was placed on DVT prophylaxis.  Past Medical History:  Diagnosis Date   Hyperlipidemia    Hypertension    Prolonged QT interval 06/21/2022    Past Surgical History:  Procedure Laterality Date   ABDOMINAL HYSTERECTOMY     APPENDECTOMY     BIOPSY  05/17/2022   Procedure: BIOPSY;  Surgeon: Carol Ada, MD;  Location: [facility] ENDOSCOPY;  Service: Gastroenterology;;   CHOLECYSTECTOMY N/A 05/22/2022   Procedure: LAPAROSCOPIC CHOLECYSTECTOMY WITH INTRAOPERATIVE CHOLANGIOGRAM;  Surgeon: Coralie Keens, MD;  Location: [place];  Service: General;  Laterality: N/A;   ESOPHAGOGASTRODUODENOSCOPY (EGD) WITH PROPOFOL N/A 05/17/2022   Procedure: ESOPHAGOGASTRODUODENOSCOPY (EGD) WITH PROPOFOL;  Surgeon: Carol Ada, MD;  Location: [place];  Service: Gastroenterology;  Laterality: N/A;   ESOPHAGOGASTRODUODENOSCOPY (EGD) WITH PROPOFOL N/A 07/12/2022   Procedure:  ESOPHAGOGASTRODUODENOSCOPY (EGD) WITH PROPOFOL;  Surgeon: Carol Ada, MD;  Location: [facility] ENDOSCOPY;  Service: Gastroenterology;  Laterality: N/A;   HERNIA REPAIR     TONSILLECTOMY     TUBAL LIGATION      Family History  Problem Relation Age of Onset   Heart failure Mother    Cancer Father    Cancer Sister     Social History:  reports that she has been smoking cigarettes. She has been smoking an average of .75 packs per day. She has never used smokeless tobacco. She reports that she does not drink alcohol and does not use drugs.  Allergies:  Allergies  Allergen Reactions   Fish Allergy Anaphylaxis   Morphine And Related Anaphylaxis and Swelling   Belsomra [Suvorexant] Other (See Comments)    Nightmares     Codeine Nausea And Vomiting   Duragesic-100 [Fentanyl] Hives   Motrin [Ibuprofen] Nausea Only and Other (See Comments)    History of stomach ulcers   Nsaids Other (See Comments)    History of stomach ulcers   Penicillins Hives    Did it involve swelling of the face/tongue/throat, SOB, or low BP? Yes Did it involve sudden or severe rash/hives, skin peeling, or any reaction on the inside of your mouth or nose? Yes Did you need to seek medical attention at a hospital or doctor's office? Yes When did it last happen? More than 10 years ago If all above answers are "NO", may proceed with cephalosporin use.    Sulfa Antibiotics Swelling    Not listed on MAR   Tolectin [Tolmetin] Other (  See Comments)    History of stomach ulcers Not listed on MAR    Medications: Scheduled:  ascorbic acid  500 mg Per Tube Daily   carvedilol  25 mg Per Tube BID WC   Chlorhexidine Gluconate Cloth  6 each Topical Daily   dorzolamide  1 drop Both Eyes BID   DULoxetine  40 mg Oral Daily   famotidine  20 mg Per Tube BID   ferrous sulfate  300 mg Per Tube BID   fesoterodine  4 mg Oral Daily   folic acid  1 mg Per Tube Daily   ipratropium-albuterol  3 mL Nebulization QID   latanoprost  1 drop  Both Eyes QHS   losartan  50 mg Per Tube Daily   multivitamin with minerals  1 tablet Oral Daily   mupirocin ointment  1 Application Nasal BID   nicotine  14 mg Transdermal Daily   mouth rinse  15 mL Mouth Rinse Q2H   pantoprazole (PROTONIX) IV  40 mg Intravenous Q12H   polyethylene glycol  17 g Per Tube Daily   Vitamin D (Ergocalciferol)  50,000 Units Oral Q7 days   Continuous:  sodium chloride 10 mL/hr at 08/22/22 1122   ceFEPime (MAXIPIME) IV Stopped (08/22/22 1450)   dexmedetomidine (PRECEDEX) IV infusion 0.5 mcg/kg/hr (08/22/22 1338)   vancomycin Stopped (08/22/22 0139)    Results for orders placed or performed during the hospital encounter of 08/18/22 (from the past 24 hour(s))  Draw ABG 1 hour after initiation of ventilator     Status: None   Collection Time: 08/21/22  5:00 PM  Result Value Ref Range   pH, Arterial 7.44 7.35 - 7.45   pCO2 arterial 34 32 - 48 mmHg   pO2, Arterial 92 83 - 108 mmHg   Bicarbonate 23.1 20.0 - 28.0 mmol/L   Acid-base deficit 0.5 0.0 - 2.0 mmol/L   O2 Saturation 98.2 %   Patient temperature 37.0    Allens test (pass/fail) PASS PASS  Basic metabolic panel     Status: Abnormal   Collection Time: 08/22/22  4:53 AM  Result Value Ref Range   Sodium 142 135 - 145 mmol/L   Potassium 3.0 (L) 3.5 - 5.1 mmol/L   Chloride 115 (H) 98 - 111 mmol/L   CO2 20 (L) 22 - 32 mmol/L   Glucose, Bld 109 (H) 70 - 99 mg/dL   BUN 10 8 - 23 mg/dL   Creatinine, Ser 0.93 0.44 - 1.00 mg/dL   Calcium 8.3 (L) 8.9 - 10.3 mg/dL   GFR, Estimated >26 >71 mL/min   Anion gap 7 5 - 15  CBC     Status: Abnormal   Collection Time: 08/22/22  4:53 AM  Result Value Ref Range   WBC 9.9 4.0 - 10.5 K/uL   RBC 3.28 (L) 3.87 - 5.11 MIL/uL   Hemoglobin 9.5 (L) 12.0 - 15.0 g/dL   HCT 24.5 (L) 80.9 - 98.3 %   MCV 89.9 80.0 - 100.0 fL   MCH 29.0 26.0 - 34.0 pg   MCHC 32.2 30.0 - 36.0 g/dL   RDW 38.2 (H) 50.5 - 39.7 %   Platelets 211 150 - 400 K/uL   nRBC 0.5 (H) 0.0 - 0.2 %   Magnesium     Status: None   Collection Time: 08/22/22  4:53 AM  Result Value Ref Range   Magnesium 2.2 1.7 - 2.4 mg/dL  Triglycerides     Status: None   Collection Time: 08/22/22  4:53 AM  Result Value Ref Range   Triglycerides 81 <150 mg/dL     Portable Chest xray  Result Date: 08/22/2022 CLINICAL DATA:  Intubated EXAM: PORTABLE CHEST 1 VIEW COMPARISON:  Chest radiograph from one day prior. FINDINGS: Endotracheal tube tip is 1.8 cm above the carina. Enteric tube enters stomach with the tip not seen on this image. Stable cardiomediastinal silhouette with normal heart size. No pneumothorax. Small left pleural effusion, probably stable accounting for differences in technique. No overt pulmonary edema. Similar left retrocardiac opacity. IMPRESSION: 1. Endotracheal tube tip is 1.8 cm above the carina, consider retracting 1 cm. 2. Small left pleural effusion, probably stable accounting for differences in technique. 3. Similar left retrocardiac opacity, favor left lower lobe atelectasis. Electronically Signed   By: Ilona Sorrel M.D.   On: 08/22/2022 08:20   DG Abd 1 View  Result Date: 08/21/2022 CLINICAL DATA:  [id number] Encounter for nasogastric (NG) tube placement [id number] EXAM: ABDOMEN - 1 VIEW COMPARISON:  X-ray abdomen 05/20/2022 FINDINGS: Enteric tube courses below the hemidiaphragm with tip and side port overlying the expected region of the gastric lumen. The bowel gas pattern is normal. Surgical tacks overlie the mid abdomen consistent with hernia repair. Right upper quadrant surgical clips. No radio-opaque calculi or other significant radiographic abnormality are seen. IMPRESSION: 1. Enteric tube in good position. 2. Nonobstructive bowel gas pattern. Electronically Signed   By: Iven Finn M.D.   On: 08/21/2022 17:06   Portable Chest x-ray  Result Date: 08/21/2022 CLINICAL DATA:  Intubated EXAM: PORTABLE CHEST 1 VIEW COMPARISON:  08/20/2022 FINDINGS: Single frontal view of the chest  demonstrates endotracheal tube overlying tracheal air column tip midway between thoracic inlet and carina. Marked improved aeration in the left chest since prior study. Residual density at the left lung base consistent with residual lung consolidation and likely small effusion. The right chest is clear. No pneumothorax. No acute bony abnormalities. IMPRESSION: 1. No complication after intubation. 2. Interval improvement in aeration of the left lung, with persistent left basilar consolidation and small left effusion. Electronically Signed   By: Randa Ngo M.D.   On: 08/21/2022 16:04    ROS:  As stated above in the HPI otherwise negative.  Blood pressure 138/66, pulse 69, temperature 97.9 F (36.6 C), temperature source Axillary, resp. rate 18, height 5\' 5"  (1.651 m), weight 77.9 kg, SpO2 100 %.    PE: Gen: Intubated and sedated, bilious fluid from the OG tube HEENT:  [place]/AT, EOMI Lungs: CTA Bilaterally CV: RRR without M/G/R ABD: Soft, NTND, +BS Ext: No C/C/E  Assessment/Plan: 1) Melena. 2) Anemia. 3) History of reflux esophagitis. 4) COVID infection on vent.   With her critical illness and the history of reflux esophagitis, the thought is that she is bleeding from her upper GI tract.  Further evaluation with an EGD will be performed while she is intubated.  There is no gross evidence of bleeding from the OG aspirate.  There is a family history of colon cancer.  Her sister died from the cancer at the age of 66.  On 06/08/2008 her colonoscopy was normal, but she never followed up.  Recently several attempts were made to have her follow up from her hospitalizations late last year, but her voice mailbox was full.  Plan: 1) EGD tomorrow. 2) Follow HGB and transfuse as necessary. 3) Continue with pantoprazole.  Linna Thebeau D 08/22/2022, 4:51 PM

## 2022-08-22 NOTE — IPAL (Signed)
  Interdisciplinary Goals of Care Family Meeting   Date carried out:: 08/22/2022  Location of the meeting: Phone conference  Member's involved: Physician and Family Member or next of kin  Durable Power of Attorney or acting medical decision maker: Son [name]  Discussion: We discussed goals of care for [facility] . She was intubated for the procedure and has not been able to extubate today, we discussed her future course.  She is also seems to be having an upper GI bleed.  [name] expressed that he did not want his mom to go through "a lot of suffering" and it seemed to him that she was suffering.  We agreed that we would not prolong mechanical ventilation but see if we can extubate her in the next day or 2.  We would not reintubate her should she fail extubation.  Maintain DNR status if she arrests.  Code status: Full DNR  Disposition: Continue current acute care   Time spent for the meeting: 15 mins  Brittan Butterbaugh V. Westen Dinino 08/22/2022, 4:44 PM

## 2022-08-22 NOTE — Progress Notes (Signed)
[facility] ADULT ICU REPLACEMENT PROTOCOL   The patient does apply for the [facility] Adult ICU Electrolyte Replacment Protocol based on the criteria listed below:   1.Exclusion criteria: TCTS, ECMO, Dialysis, and Myasthenia Gravis patients 2. Is GFR >/= 30 ml/min? Yes.    Patient's GFR today is >60 3. Is SCr </= 2? Yes.   Patient's SCr is 0.87 mg/dL 4. Did SCr increase >/= 0.5 in 24 hours? No. 5.Pt's weight >40kg  Yes.   6. Abnormal electrolyte(s): potassium 3.0  7. Electrolytes replaced per protocol 8.  Call MD STAT for K+ </= 2.5, Phos </= 1, or Mag </= 1 Physician:  n/a  Stephanie Benton 08/22/2022 5:41 AM

## 2022-08-22 NOTE — Progress Notes (Signed)
Informed Dr. Elsworth Soho of patient's current BP at 90/41 (MAP 56) after repositioning BP cuff and recheck. Verbal order for 500cc bolus of NS.

## 2022-08-22 NOTE — Progress Notes (Addendum)
NAME:  Stephanie Benton, MRN:  [id number], DOB:  05-29-1951, LOS: 4 ADMISSION DATE:  08/18/2022, CONSULTATION DATE:  08/22/22 REFERRING MD:  Louanne Belton, CHIEF COMPLAINT:  pneumonia  History of Present Illness:  Stephanie Benton is a 72 y.o. F with PMH significant for COPD, Bipolar Disorder, HTN, HL, pancreatitis who presented from SNF with increased dyspnea.  She was recently admitted with pneumonia and Covid-19 from 1/9-1/13, she was initially treated with aztreonam, azithromycin and vanc and discharged on omnicef and was on room air .   She returned to the ED on 1/14 with worsening dyspnea and CXR showed opacification of the L hemithorax suggestive of mucous plugging.  She required 2-3L oxygen and labs were significant for WBC of 20k, Hgb 7.3, lactic acid 2.4, procalcitonin 0.61.   She was admitted and started on Vancomycin and Cefepime along with hypertonic saline nebs, IS and chest PT.  A repeat CXR this morning was unchanged, so PCCM consulted.  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   Significant Hospital Events: Including procedures, antibiotic start and stop dates in addition to other pertinent events   1/15 presented with L hemithorax opacification and dyspnea, PCCM consult 1/16 more somnolent with Hgb drop,CXR without improvement in L-sided opacification, transfused 2 units PRBC for hemoglobin 5.5 --> 10 1/17 bronchoscopy with removal of thick mucous plugs from left mainstem and segmental bronchi, failed extubation postprocedure and transferred to ICU  Interim History / Subjective:   Critically ill, intubated. Sedated on propofol. Episode of hypotension overnight, improved with albumin bolus, did not require pressors   Objective   Blood pressure (!) 154/78, pulse 94, temperature 98.6 F (37 C), temperature source Axillary, resp. rate 18, height 5' 5"  (1.651  m), weight 77.9 kg, SpO2 100 %.    Vent Mode: PRVC FiO2 (%):  [30 %-100 %] 30 % Set Rate:  [18 bmp] 18 bmp Vt Set:  [450 mL] 450 mL PEEP:  [5 cmH20] 5 cmH20 Plateau Pressure:  [15 cmH20-19 cmH20] 15 cmH20   Intake/Output Summary (Last 24 hours) at 08/22/2022 0953 Last data filed at 08/22/2022 0600 Gross per 24 hour  Intake 2002.75 ml  Output 350 ml  Net 1652.75 ml    Filed Weights   08/18/22 1753 08/19/22 1835 08/21/22 1545  Weight: 73.9 kg 88.8 kg 77.9 kg   General:  thin, elderly woman, intubated, sedated no acute distress HEENT: MM pink/moist, sclera anicteric Neuro: Sedate on propofol, RASS -1, opens eyes to stimulus but does not follow commands CV: s1s2 rrr, no m/r/g PULM: Improved air entry on left, no accessory muscle use, clear on right GI: soft, non-distended  Extremities: warm/dry, no edema  Skin: no rashes or lesions  Labs show hypokalemia, no leukocytosis, stable anemia. ABG shows normal Asimia Chest x-ray shows improved aeration on left, retrocardiac opacity persists  Resolved Hospital Problem list     Assessment & Plan:  Acute hypoxic respiratory failure, requiring mechanical ventilation post bronchoscopy L hemithorax opacification/atelectasis due to mucous plugs Discharged 1/13 for CAP and Covid on Omnicef and re-admitted one day later Strep pneumo and legionella were negative 5 days ago, covid +, RVP neg  -Continue tracheobronchial toilet with hypertonic saline nebs, aggressive chest PT  -Spontaneous breathing trials with goal extubation -Follow BAL cultures --continue cefepime/vancomycin in the meantime  Co`vid + -Airborne precautions during hospital stay  Acute Encephalopathy -spoke with patient's son,  states that at baseline she is up all night and sleeps during the day, there has been recent concern for dementia.  She will normally interact in the evenings and at night -Was able to protect her airway prior to procedures, so hopefully we can get  her to extubate. Will switch from propofol to Precedex with RASS goal 0 to facilitate extubation   UGI bleed Acute blood loss anemia Hgb drop from 7.3 to 5.5 >> 2 U PRBC >> 10  No evidence of overt bleeding and no large effusion with POCUS to suggest hemothorax -RN reports dark stool today - follow CBC and monitor for signs of bleeding  Hypokalemia will be supplemented     Best Practice (right click and "Reselect all SmartList Selections" daily)   DNR status in the event of cardiac arrest but full medical care otherwise [name] will be updated  Labs   CBC: Recent Labs  Lab 08/16/22 0540 08/18/22 2025 08/19/22 0500 08/20/22 0510 08/20/22 1427 08/21/22 0922 08/22/22 0453  WBC 11.8* 20.1* 20.9* 10.7* 8.3 8.4 9.9  NEUTROABS 9.0* 15.5*  --   --   --   --   --   HGB 7.4* 7.7* 7.3* 5.5* 5.5* 10.0* 9.5*  HCT 22.4* 24.9* 23.3* 19.4* 18.3* 31.3* 29.5*  MCV 87.5 89.2 90.7 96.5 93.4 88.9 89.9  PLT 308 376 331 228 204 226 211     Basic Metabolic Panel: Recent Labs  Lab 08/16/22 0540 08/17/22 0141 08/18/22 2025 08/19/22 0500 08/20/22 0510 08/21/22 0922 08/22/22 0453  NA 144   < > 140 144 145 143 142  K 3.4*   < > 3.0* 3.1* 3.4* 2.9* 3.0*  CL 117*   < > 115* 116* 121* 116* 115*  CO2 20*   < > 17* 19* 19* 22 20*  GLUCOSE 83   < > 97 90 116* 83 109*  BUN <5*   < > 16 15 11 8 10   CREATININE 0.86   < > 0.91 0.88 0.78 0.77 0.87  CALCIUM 8.0*   < > 8.0* 8.0* 7.7* 8.2* 8.3*  MG 1.8  --   --   --  1.6* 2.1 2.2  PHOS 2.5  --   --   --   --   --   --    < > = values in this interval not displayed.    GFR: Estimated Creatinine Clearance: 60.3 mL/min (by C-G formula based on SCr of 0.87 mg/dL). Recent Labs  Lab 08/18/22 2025 08/19/22 0030 08/19/22 0500 08/20/22 0510 08/20/22 1427 08/21/22 0922 08/22/22 0453  PROCALCITON  --   --  0.61  --   --   --   --   WBC 20.1*  --  20.9* 10.7* 8.3 8.4 9.9  LATICACIDVEN 2.4* 1.7  --   --   --   --   --      Liver Function  Tests: Recent Labs  Lab 08/16/22 0540 08/18/22 2025 08/19/22 0500  AST 17 25 20   ALT 13 16 14   ALKPHOS 102 91 77  BILITOT 0.9 0.7 0.9  PROT 5.0* 5.8* 5.0*  ALBUMIN 1.9* 2.2* 2.0*    No results for input(s): "LIPASE", "AMYLASE" in the last 168 hours.  No results for input(s): "AMMONIA" in the last 168 hours.  ABG    Component Value Date/Time   PHART 7.44 08/21/2022 1700   PCO2ART 34 08/21/2022 1700   PO2ART 92 08/21/2022 1700   HCO3 23.1 08/21/2022 1700  TCO2 23 08/13/2022 1417   ACIDBASEDEF 0.5 08/21/2022 1700   O2SAT 98.2 08/21/2022 1700     Coagulation Profile: Recent Labs  Lab 08/18/22 2025 08/19/22 0500  INR 1.2 1.3*     Cardiac Enzymes: No results for input(s): "CKTOTAL", "CKMB", "CKMBINDEX", "TROPONINI" in the last 168 hours.   HbA1C: No results found for: "HGBA1C"  CBG: Recent Labs  Lab 08/15/22 1118 08/15/22 1743 08/15/22 2138 08/18/22 1750  GLUCAP 93 95 99 84    My independent critical care time was 35 minutes  Claretha Townshend V. Elsworth Soho MD [facility] See Amion for pager If no response to pager , please call [phone] until 7pm After 7:00 pm call Elink  [phone]

## 2022-08-22 NOTE — Progress Notes (Signed)
Informed Dr. Elsworth Soho of patient's black/green BM.   See new orders for CBC, discontinue lovenox, and obtain stool sample.

## 2022-08-22 NOTE — Progress Notes (Addendum)
Initial Nutrition Assessment  DOCUMENTATION CODES:   Non-severe (moderate) malnutrition in context of chronic illness  INTERVENTION:  - Per CCM holding off on starting tube feeds today.   - Can possibly start TF tomorrow. Recs below if patient to remain intubated and plan to start TF: Osmolite 1.5 at 55 ml/h (1320 ml per day) *Recommend starting at 11mL/hr and advancing by 82mL Q8H d/t risk of refeeding Prosource TF20 60 ml daily Provides 2060 kcal, 103 gm protein, 1006 ml free water daily  - Recommend 100mg  thiamine x5 days if starting tube feeds due to risk of refeeding syndrome. - Monitor magnesium, potassium, and phosphorus BID for at least 3 days, MD to replete as needed, as pt is at risk for refeeding syndrome. - Monitor weight trends.   - Patient noted to have several vitamin deficiencies: vitamin A, D, E, K, and C.   - Patient currently receiving: 50000 units vitamin D weekly, 500mg  vitamin C daily, and MVI  - Patient needs vitamin A, E, and K supplementation. Vitamin A flags for fish allergy. Will follow up on these once patient stabilizes.    NUTRITION DIAGNOSIS:   Moderate Malnutrition related to chronic illness (chronic pancreatitis, COPD) as evidenced by moderate fat depletion, moderate muscle depletion.  GOAL:   Patient will meet greater than or equal to 90% of their needs  MONITOR:   Vent status, TF tolerance, Weight trends, Labs  REASON FOR ASSESSMENT:   Ventilator    ASSESSMENT:     72 y.o. female with medical history significant of HTN, HLD, bipolar disorder, AKI, chronic abdominal pain, chronic pancreatitis, esophagitis tobacco abuse, COPD, who was just discharged 1/13 to SNF after admission with severe sepsis as a result of CAP and COVID-19 infection but was brought back 1/14 for SOB and hypoxia.    1/14 Admit 1/17 bronchoscopy, failed extubation afterwards  Patient intubated and sedated at time of visit. No family at bedside.  Per EMR, patient's  weight has appears stable since October until this month when it has fluctuated between 163# to 196#. Suspect lower weight may be more accurate given report of ongoing inadequate intake PTA and malnourished state on NFPE.   Patient was assessed by RD during recent admission and was able to speak to patient's son. At that time, the son reported patient has had decreased appetite and intake for several months. Had been receiving Ensure but son was unsure if she had been drinking it.  Patient had been on a DYS 1 diet during recent admission.   Of note, several vitamin deficiencies noted.  Supplementation was started in December PTA (started ~07/23/22 per chart): vitamin C 829 mg daily, folic acid 1 mg daily, 235mg  iron daily, MVI daily, vitamin D2 50000 units once weekly on Tuesdays. Do not see vitamin A or vitamin E supplementation previously ordered. Vitamin A pill flagged for fish allergy.   RN reported dark stools today and due to drop in Hb concern for GI bleed.  Due to this, holding off on starting tube feeds today.  Per discussion with CCM MD, can likely start tomorrow if GI does not do a scope.  Of note, patient's ensure that was ordered when she was on an oral diet was changed to per tube and 2 bottles were provided to patient today. Made MD aware, will discontinue at this time and follow for ability to start tube feeds.   Medications reviewed and include: 500mg  vitamin C, iron, folic acid, MVI, 56213 units Vitamin D, Miralax  Labs reviewed: K+ 3.0    Latest Reference Range & Units 07/11/22 16:56 08/13/22 20:04  Vitamin D, 25-Hydroxy 30 - 100 ng/mL 28.54 (L)   Vitamin A (Retinoic Acid) 22.0 - 69.5 ug/dL 11.6 (L)   Vitamin B12 180 - 914 pg/mL  313  Vitamin C 0.4 - 2.0 mg/dL <0.1 (L)   Vitamin E (Alpha Tocopherol) 9.0 - 29.0 mg/L 8.6 (L)   Vitamin E(Gamma Tocopherol) 0.5 - 4.9 mg/L 1.2 (C)   VITAMIN K1 0.10 - 2.20 ng/mL <0.10 (L)   (L): Data is abnormally low (C):  Corrected  NUTRITION - FOCUSED PHYSICAL EXAM:  Flowsheet Row Most Recent Value  Orbital Region Moderate depletion  Upper Arm Region Moderate depletion  Thoracic and Lumbar Region Mild depletion  Buccal Region Moderate depletion  Temple Region Severe depletion  Clavicle Bone Region Moderate depletion  Clavicle and Acromion Bone Region Moderate depletion  Scapular Bone Region Unable to assess  Dorsal Hand Mild depletion  Patellar Region Mild depletion  Anterior Thigh Region Mild depletion  Posterior Calf Region Mild depletion  Edema (RD Assessment) Mild  Hair Reviewed  Eyes Reviewed  Mouth Reviewed  Skin Reviewed  Nails Reviewed       Diet Order:   Diet Order             Diet NPO time specified  Diet effective now                   EDUCATION NEEDS:  Not appropriate for education at this time  Skin:  Skin Assessment: Skin Integrity Issues: Skin Integrity Issues:: Stage II Stage II: Anus  Last BM:  unknown  Height:  Ht Readings from Last 1 Encounters:  08/21/22 5\' 5"  (1.651 m)   Weight:  Wt Readings:  08/18/22 73.9 kg    BMI:  Body mass index is 27.11 kg/m.  Estimated Nutritional Needs:  Kcal:  9977-4142 kcals  Protein:  95-110 grams Fluid:  >/= 1.8L    Samson Frederic RD, LDN For contact information, refer to [facility].

## 2022-08-23 ENCOUNTER — Encounter (HOSPITAL_COMMUNITY)
Admission: EM | Disposition: A | Discharge status: Skilled Nursing Facility | Payer: Self-pay | Source: Home / Self Care | Attending: Internal Medicine

## 2022-08-23 ENCOUNTER — Inpatient Hospital Stay (HOSPITAL_COMMUNITY): Payer: 59

## 2022-08-23 DIAGNOSIS — A419 Sepsis, unspecified organism: Secondary | ICD-10-CM | POA: Diagnosis not present

## 2022-08-23 DIAGNOSIS — R6520 Severe sepsis without septic shock: Secondary | ICD-10-CM | POA: Diagnosis not present

## 2022-08-23 HISTORY — PX: ESOPHAGOGASTRODUODENOSCOPY: SHX5428

## 2022-08-23 LAB — CBC WITH DIFFERENTIAL/PLATELET
Abs Immature Granulocytes: 0.06 10*3/uL (ref 0.00–0.07)
Basophils Absolute: 0 10*3/uL (ref 0.0–0.1)
Basophils Relative: 0 %
Eosinophils Absolute: 0 10*3/uL (ref 0.0–0.5)
Eosinophils Relative: 0 %
HCT: 31.8 % — ABNORMAL LOW (ref 36.0–46.0)
Hemoglobin: 10.3 g/dL — ABNORMAL LOW (ref 12.0–15.0)
Immature Granulocytes: 1 %
Lymphocytes Relative: 12 %
Lymphs Abs: 1.1 10*3/uL (ref 0.7–4.0)
MCH: 29 pg (ref 26.0–34.0)
MCHC: 32.4 g/dL (ref 30.0–36.0)
MCV: 89.6 fL (ref 80.0–100.0)
Monocytes Absolute: 0.5 10*3/uL (ref 0.1–1.0)
Monocytes Relative: 5 %
Neutro Abs: 7.4 10*3/uL (ref 1.7–7.7)
Neutrophils Relative %: 82 %
Platelets: UNDETERMINED 10*3/uL (ref 150–400)
RBC: 3.55 MIL/uL — ABNORMAL LOW (ref 3.87–5.11)
RDW: 18.2 % — ABNORMAL HIGH (ref 11.5–15.5)
WBC: 9.1 10*3/uL (ref 4.0–10.5)
nRBC: 0.3 % — ABNORMAL HIGH (ref 0.0–0.2)

## 2022-08-23 LAB — BASIC METABOLIC PANEL
Anion gap: 11 (ref 5–15)
BUN: 13 mg/dL (ref 8–23)
CO2: 16 mmol/L — ABNORMAL LOW (ref 22–32)
Calcium: 8.2 mg/dL — ABNORMAL LOW (ref 8.9–10.3)
Chloride: 117 mmol/L — ABNORMAL HIGH (ref 98–111)
Creatinine, Ser: 0.75 mg/dL (ref 0.44–1.00)
GFR, Estimated: >60 mL/min (ref >60)
Glucose, Bld: 85 mg/dL (ref 70–99)
Potassium: 4.2 mmol/L (ref 3.5–5.1)
Sodium: 144 mmol/L (ref 135–145)

## 2022-08-23 LAB — ACID FAST SMEAR (AFB, MYCOBACTERIA): Acid Fast Smear: NEGATIVE

## 2022-08-23 LAB — GLUCOSE, CAPILLARY: Glucose-Capillary: 77 mg/dL (ref 70–99)

## 2022-08-23 LAB — MAGNESIUM: Magnesium: 2.4 mg/dL (ref 1.7–2.4)

## 2022-08-23 LAB — PHOSPHORUS: Phosphorus: 2.7 mg/dL (ref 2.5–4.6)

## 2022-08-23 SURGERY — EGD (ESOPHAGOGASTRODUODENOSCOPY)
Anesthesia: Moderate Sedation

## 2022-08-23 MED ORDER — MIDAZOLAM HCL 2 MG/2ML IJ SOLN
1.0000 mg | INTRAMUSCULAR | Status: DC | PRN
  Administered 2022-08-24 – 2022-08-26 (×4): 1 mg via INTRAVENOUS
  Filled 2022-08-23 (×6): qty 2

## 2022-08-23 MED ORDER — FENTANYL CITRATE PF 50 MCG/ML IJ SOSY
25.0000 ug | PREFILLED_SYRINGE | INTRAMUSCULAR | Status: DC | PRN
  Filled 2022-08-23: qty 1

## 2022-08-23 MED ORDER — SODIUM CHLORIDE 0.9 % IV BOLUS
500.0000 mL | Freq: Once | INTRAVENOUS | Status: AC
  Administered 2022-08-23: 500 mL via INTRAVENOUS

## 2022-08-23 MED ORDER — FENTANYL CITRATE (PF) 100 MCG/2ML IJ SOLN
INTRAMUSCULAR | Status: AC
  Filled 2022-08-23: qty 2

## 2022-08-23 MED ORDER — FENTANYL CITRATE (PF) 100 MCG/2ML IJ SOLN: 100.0000 ug | Freq: Once | INTRAMUSCULAR | Status: AC

## 2022-08-23 MED ORDER — MIDAZOLAM HCL 2 MG/2ML IJ SOLN: 2.0000 mg | Freq: Once | INTRAMUSCULAR | Status: AC

## 2022-08-23 MED ORDER — HYDROMORPHONE HCL 1 MG/ML IJ SOLN
0.5000 mg | INTRAMUSCULAR | Status: AC | PRN
  Administered 2022-08-24: 0.5 mg via INTRAVENOUS
  Administered 2022-08-25 (×2): 1 mg via INTRAVENOUS
  Filled 2022-08-23 (×4): qty 1

## 2022-08-23 MED ORDER — EPINEPHRINE 1 MG/10ML IJ SOSY
PREFILLED_SYRINGE | INTRAMUSCULAR | Status: AC
  Filled 2022-08-23: qty 10

## 2022-08-23 MED ORDER — VITAMIN E 45 MG (100 UNIT) PO CAPS
400.0000 [IU] | ORAL_CAPSULE | Freq: Every day | ORAL | Status: DC
  Administered 2022-08-23: 400 [IU]
  Filled 2022-08-23: qty 4

## 2022-08-23 MED ORDER — VITAMIN E 45 MG (100 UNIT) PO CAPS
400.0000 [IU] | ORAL_CAPSULE | Freq: Every day | ORAL | Status: DC
  Filled 2022-08-23: qty 4

## 2022-08-23 MED ORDER — HYDRALAZINE HCL 20 MG/ML IJ SOLN
10.0000 mg | INTRAMUSCULAR | Status: DC | PRN
  Administered 2022-08-23 – 2022-09-02 (×5): 10 mg via INTRAVENOUS
  Filled 2022-08-23 (×5): qty 1

## 2022-08-23 MED ORDER — VITAMIN E 6.75 MG/0.3ML PO SOLN
400.0000 [IU] | Freq: Every day | ORAL | Status: DC
  Administered 2022-08-24 – 2022-08-27 (×4): 400 [IU]
  Filled 2022-08-23 (×5): qty 8

## 2022-08-23 MED ORDER — MIDAZOLAM HCL (PF) 5 MG/ML IJ SOLN
INTRAMUSCULAR | Status: AC
  Filled 2022-08-23: qty 1

## 2022-08-23 MED ORDER — DEXTROSE 50 % IV SOLN
INTRAVENOUS | Status: AC
  Administered 2022-08-23: 50 mL
  Filled 2022-08-23: qty 50

## 2022-08-23 MED ORDER — MIDAZOLAM HCL 2 MG/2ML IJ SOLN
2.0000 mg | Freq: Once | INTRAMUSCULAR | Status: AC
  Administered 2022-08-23: 2 mg via INTRAVENOUS

## 2022-08-23 MED ORDER — DEXMEDETOMIDINE HCL IN NACL 200 MCG/50ML IV SOLN
0.0000 ug/kg/h | INTRAVENOUS | Status: DC
  Administered 2022-08-23 (×2): 0.4 ug/kg/h via INTRAVENOUS
  Administered 2022-08-24: 0.2 ug/kg/h via INTRAVENOUS
  Administered 2022-08-24 – 2022-08-25 (×2): 0.5 ug/kg/h via INTRAVENOUS
  Filled 2022-08-23 (×5): qty 50

## 2022-08-23 MED ORDER — ATROPINE SULFATE 1 MG/10ML IJ SOSY
PREFILLED_SYRINGE | INTRAMUSCULAR | Status: AC
  Filled 2022-08-23: qty 10

## 2022-08-23 MED ORDER — ADULT MULTIVITAMIN W/MINERALS CH
1.0000 | ORAL_TABLET | Freq: Every day | ORAL | Status: DC
  Administered 2022-08-24 – 2022-08-26 (×3): 1
  Filled 2022-08-23 (×4): qty 1

## 2022-08-23 NOTE — Progress Notes (Addendum)
During nursing care patient had a new onset of bradycardia with two events resulting in a HR in the 30's which resolved within a couple of seconds to a bradicardic heart rate of 58. The patient is alert but does not follow commands (not new). On assessment, patient sitting up in bed, alert, very tense, appears to be bearing down and holding her breath with ventilator alarms going off; fentanyl given for discomfort/ventilator dyssynchrony. Atropine was placed at bedside. Informed Dr. Vaughan Browner and Donzetta Matters, NP.   Per Dr. Vaughan Browner to titrate down on the precedex and consider changing sedation medication and continue with current plan of care with limiting sedation administration.

## 2022-08-23 NOTE — Progress Notes (Addendum)
NAME:  Stephanie Benton, MRN:  [id number], DOB:  March 24, 1951, LOS: 5 ADMISSION DATE:  08/18/2022, CONSULTATION DATE:  1/15 REFERRING MD:  Pokhrel, CHIEF COMPLAINT:  pneumonia  History of Present Illness:  72 y/o F with PMH significant for COPD, Bipolar Disorder, HTN, HL, pancreatitis who presented from SNF with increased dyspnea.  She was recently admitted with pneumonia and Covid-19 from 1/9-1/13, she was initially treated with aztreonam, azithromycin and vanc and discharged on omnicef and was on room air .   She returned to the ED on 1/14 with worsening dyspnea and CXR showed opacification of the L hemithorax suggestive of mucous plugging.  She required 2-3L oxygen and labs were significant for WBC of 20k, Hgb 7.3, lactic acid 2.4, procalcitonin 0.61.   She was admitted and started on Vancomycin and Cefepime along with hypertonic saline nebs, IS and chest PT.  A repeat CXR am 1/15 was unchanged, so PCCM consulted. S/p FOB on 1/17 with removal of thick secretions, failed extubation post procedure. Remained intubated in ICU. Concern for possible GIB 1/18 with significant Hgb drop, black stool.    Pertinent  Medical History   has a past medical history of Hyperlipidemia, Hypertension, and Prolonged QT interval (06/21/2022).   Significant Hospital Events: Including procedures, antibiotic start and stop dates in addition to other pertinent events   1/15 presented with L hemithorax opacification and dyspnea, PCCM consult 1/16 more somnolent with Hgb drop,CXR without improvement in L-sided opacification, transfused 2 units PRBC for hemoglobin 5.5 --> 10 1/17 bronchoscopy with removal of thick mucous plugs from left mainstem and segmental bronchi, failed extubation postprocedure and transferred to ICU 1/18 Concern for GIB, Hgb drop 5.5, GI consulted. Remains on vent  Interim History / Subjective:  Afebrile  Glucose range 83-116 I/O UOP 965m, +3158min last 24 hours   Objective   Blood pressure (!)  142/72, pulse 66, temperature (!) 97.3 F (36.3 C), temperature source Axillary, resp. rate 18, height 5' 5"  (1.651 m), weight 77.9 kg, SpO2 100 %.    Vent Mode: PRVC FiO2 (%):  [30 %] 30 % Set Rate:  [18 bmp] 18 bmp Vt Set:  [450 mL] 450 mL PEEP:  [5 cmH20] 5 cmH20 Plateau Pressure:  [13 cmH20-16 cmH20] 15 cmH20   Intake/Output Summary (Last 24 hours) at 08/23/2022 0752 Last data filed at 08/23/2022 0300 Gross per 24 hour  Intake 1642.15 ml  Output 1325 ml  Net 317.15 ml   Filed Weights   08/18/22 1753 08/19/22 1835 08/21/22 1545  Weight: 73.9 kg 88.8 kg 77.9 kg   Exam:  General: elderly adult female lying in bed on vent in NAD  HEENT: MM pink/moist, ETT, anicteric Neuro: sedate on precedex CV: s1s2 RRR, no m/r/g PULM:  non-labored on vent, lungs coarse bilaterally with good air entry GI: soft, non-distended, bsx4 active  Extremities: warm/dry, no edema  Skin: no rashes or lesions   Hgb stable > 10.3  Resolved Hospital Problem list     Assessment & Plan:   Acute hypoxic respiratory failure, requiring mechanical ventilation post bronchoscopy L hemithorax opacification/atelectasis due to mucous plugs Discharged 1/13 for CAP and Covid on Omnicef and re-admitted one day later. Strep pneumo and legionella were negative 5 days ago, covid +, RVP neg.  -PRVC with LTVV -wean PEEP / FiO2 for sats >90% -SBT / WUA daily, goal for extubation  -continue cefepime, vanco -BAL pending, follow for maturity  -intermittent CXR  -was able to protect airway prior to procedures  COVID + -airborne precautions   Acute Metabolic Encephalopathy Spoke with patient's son, states that at baseline she is up all night and sleeps during the day, there has been recent concern for dementia.  She will normally interact in the evenings and at night -continue precedex with PRN versed/fentanyl -RASS Goal 0 to -1  -follow neuro exam  R/o UGI Bleed Acute blood loss anemia Hgb drop from 7.3 to 5.5  >> 2 U PRBC >> 10. No evidence of overt bleeding and no large effusion with POCUS to suggest hemothorax.  Dark stool on 1/18.  Recent EGD per Dr. Benson Norway in 07/2022.   -repeat EGD 1/19 am with gastritis LA Grade D -consider assessment of CT abd to r/o RP bleed, labs remain stable.  ? Erroneous lab error? Hgb response out of proportion to units transfused. Will review with MD.  -trend CBC   Hypokalemia  -monitor, replace as indicated   Best Practice (right click and "Reselect all SmartList Selections" daily)   DNR status in the event of cardiac arrest but full medical care otherwise [name] updated via phone on plan of care.   Critical Care Time: 34 minutes     Noe Gens, MSN, APRN, NP-C, AGACNP-BC [facility] 08/23/2022, 7:52 AM   Please see [email or web address] for pager details.   From 7A-7P if no response, please call [phone] After hours, please call ELink [phone]

## 2022-08-23 NOTE — Progress Notes (Signed)
[facility] Progress Note Patient Name: Stephanie Benton DOB: Dec 20, 1950 MRN: [id number]   Date of Service  08/23/2022  HPI/Events of Note  Patient has listed Fentanyl allergy (Hives).  eICU Interventions  Fentanyl discontinued and PRN Dilaudid substituted.        Frederik Pear 08/23/2022, 8:42 PM

## 2022-08-23 NOTE — Progress Notes (Signed)
Patient noted to be awake, eyes wide open, not tracking well. Went to give fentanyl for sedation, but red allergy warning alerted that patient has an allergy to fentanyl that causes hives, with a related allergy to morphine. Unable to tell if it is the fentanyl patch, or the medication itself. Contacted Elink and pharmacy who is also unable to find out more information. Attempted to check patient's medication dispense record, but I do not see where she has had fentanyl patches previously prescribed. Contacted elink again for a possible medication change. Patient is calm at this time with diminished mentation. RT at bedside performing chest PT.

## 2022-08-23 NOTE — Op Note (Signed)
[facility] Patient Name: Stephanie Benton Procedure Date: 08/23/2022 MRN: [id number] Attending MD: Carol Ada , MD, [id number] Date of Birth: 29-Oct-1950 CSN: [id number] Age: 72 Admit Type: Inpatient Procedure:                Upper GI endoscopy Indications:              Iron deficiency anemia, Melena Providers:                Carol Ada, MD, Fanny Skates, RN, Vista Lawman,                            RN, Cletis Athens, Technician Referring MD:              Medicines:                Fentanyl 100 micrograms IV, Midazolam 2 mg IV Complications:            No immediate complications. Estimated Blood Loss:     Estimated blood loss: none. Procedure:                Pre-Anesthesia Assessment:                           - Prior to the procedure, a History and Physical                            was performed, and patient medications and                            allergies were reviewed. The patient's tolerance of                            previous anesthesia was also reviewed. The risks                            and benefits of the procedure and the sedation                            options and risks were discussed with the patient.                            All questions were answered, and informed consent                            was obtained. Prior Anticoagulants: The patient has                            taken no anticoagulant or antiplatelet agents. ASA                            Grade Assessment: IV - A patient with severe                            systemic disease that is a constant threat to life.  After reviewing the risks and benefits, the patient                            was deemed in satisfactory condition to undergo the                            procedure.                           - Sedation was administered by an anesthesia                            professional. Deep sedation was attained.                           After  obtaining informed consent, the endoscope was                            passed under direct vision. Throughout the                            procedure, the patient's blood pressure, pulse, and                            oxygen saturations were monitored continuously. The                            [id number]-[id number] ([id number] Olympus endoscope was introduced                            through the mouth, and advanced to the second part                            of duodenum. The upper GI endoscopy was                            accomplished without difficulty. The patient                            tolerated the procedure well. Scope In: Scope Out: Findings:      LA Grade D (one or more mucosal breaks involving at least 75% of       esophageal circumference) esophagitis with no bleeding was found in the       middle and lower third of the esophagus.      A 3 cm hiatal hernia was present.      A few dispersed small erosions with no stigmata of recent bleeding were       found on the greater curvature of the stomach.      The examined duodenum was normal.      An LA Grade D esophagitis was again identified. There was no evidence of       active bleeding. The OG tube did cause some minor erosions in the       gastric body. Impression:               -  LA Grade D reflux esophagitis with no bleeding.                           - 3 cm hiatal hernia.                           - Erosive gastropathy with no stigmata of recent                            bleeding.                           - Normal examined duodenum.                           - No specimens collected. Moderate Sedation:      Not Applicable - Patient had care per Anesthesia. Recommendation:           - Return patient to ICU for ongoing care.                           - Resume previous diet.                           - Continue present medications.                           - Continue with pantoprazole BID. Procedure Code(s):        ---  Professional ---                           [phone], Esophagogastroduodenoscopy, flexible,                            transoral; diagnostic, including collection of                            specimen(s) by brushing or washing, when performed                            (separate procedure) Diagnosis Code(s):        --- Professional ---                           K21.00, Gastro-esophageal reflux disease with                            esophagitis, without bleeding                           K44.9, Diaphragmatic hernia without obstruction or                            gangrene                           K31.89, Other diseases of stomach and duodenum  D50.9, Iron deficiency anemia, unspecified                           K92.1, Melena (includes Hematochezia) CPT copyright 2022 [employer]. All rights reserved. The codes documented in this report are preliminary and upon coder review may  be revised to meet current compliance requirements. Jeani Hawking, MD Jeani Hawking, MD 08/23/2022 9:22:44 AM This report has been signed electronically. Number of Addenda: 0

## 2022-08-23 NOTE — Progress Notes (Signed)
[facility] Progress Note Patient Name: Stephanie Benton DOB: March 19, 1951 MRN: [id number]   Date of Service  08/23/2022  HPI/Events of Note  Patient is oliguric and bladder scan is positive for 400 ml  of urine in the bladder.  eICU Interventions  In / Out bladder catheterization ordered.        Heyden Jaber U Stefania Goulart 08/23/2022, 2:01 AM

## 2022-08-23 NOTE — Progress Notes (Signed)
Brief Nutrition Note  Per CCM, try to extubate today.  Following up on patient's vitamin deficiencies.  Patient noted to have several vitamin deficiencies: vitamin A, D, E, K, and C.  Patient currently ordered: 50000 units vitamin D weekly, 500mg  vitamin C daily, 400 units vitamin E, and a multivitamin  Patient needs vitamin A and K supplementation. Per pharmacy, there has been ADEK vitamins in stock before but none currently available at this time.  Vitamin A flags for fish allergy.  Per discussion with pharmacy, can try and talk with patient after extubation to obtain details about fish allergy. Unfortunately there is not enough IV vitamin A for a full course to correct deficiency. Multivitamin tablet patient is receiving has 5000 units of vitamin A. Patient may need further repletion after discharge via IV if unable to obtain more information about fish allergy.   Will defer vitamin K supplementation to CCM.   Will continue to follow.   Samson Frederic RD, LDN For contact information, refer to [facility].

## 2022-08-24 ENCOUNTER — Encounter (HOSPITAL_COMMUNITY): Payer: Self-pay | Admitting: Pulmonary Disease

## 2022-08-24 DIAGNOSIS — R6520 Severe sepsis without septic shock: Secondary | ICD-10-CM | POA: Diagnosis not present

## 2022-08-24 DIAGNOSIS — A419 Sepsis, unspecified organism: Secondary | ICD-10-CM | POA: Diagnosis not present

## 2022-08-24 LAB — BASIC METABOLIC PANEL
Anion gap: 6 (ref 5–15)
BUN: 15 mg/dL (ref 8–23)
CO2: 19 mmol/L — ABNORMAL LOW (ref 22–32)
Calcium: 8 mg/dL — ABNORMAL LOW (ref 8.9–10.3)
Chloride: 118 mmol/L — ABNORMAL HIGH (ref 98–111)
Creatinine, Ser: 0.67 mg/dL (ref 0.44–1.00)
GFR, Estimated: >60 mL/min (ref >60)
Glucose, Bld: 74 mg/dL (ref 70–99)
Potassium: 3.1 mmol/L — ABNORMAL LOW (ref 3.5–5.1)
Sodium: 143 mmol/L (ref 135–145)

## 2022-08-24 LAB — GLUCOSE, CAPILLARY
Glucose-Capillary: 131 mg/dL — ABNORMAL HIGH (ref 70–99)
Glucose-Capillary: 74 mg/dL (ref 70–99)
Glucose-Capillary: 74 mg/dL (ref 70–99)
Glucose-Capillary: 76 mg/dL (ref 70–99)
Glucose-Capillary: 79 mg/dL (ref 70–99)
Glucose-Capillary: 80 mg/dL (ref 70–99)
Glucose-Capillary: 83 mg/dL (ref 70–99)

## 2022-08-24 LAB — CULTURE, BLOOD (ROUTINE X 2)
Culture: NO GROWTH
Special Requests: ADEQUATE

## 2022-08-24 LAB — CBC
HCT: 35.1 % — ABNORMAL LOW (ref 36.0–46.0)
Hemoglobin: 11.1 g/dL — ABNORMAL LOW (ref 12.0–15.0)
MCH: 28.9 pg (ref 26.0–34.0)
MCHC: 31.6 g/dL (ref 30.0–36.0)
MCV: 91.4 fL (ref 80.0–100.0)
Platelets: 171 10*3/uL (ref 150–400)
RBC: 3.84 MIL/uL — ABNORMAL LOW (ref 3.87–5.11)
RDW: 18.5 % — ABNORMAL HIGH (ref 11.5–15.5)
WBC: 17.4 10*3/uL — ABNORMAL HIGH (ref 4.0–10.5)
nRBC: 0.1 % (ref 0.0–0.2)

## 2022-08-24 MED ORDER — SODIUM CHLORIDE 0.9 % IV SOLN
250.0000 mL | INTRAVENOUS | Status: DC
  Administered 2022-08-24 – 2022-08-26 (×4): 250 mL via INTRAVENOUS

## 2022-08-24 MED ORDER — NOREPINEPHRINE 4 MG/250ML-% IV SOLN: 2.0000 ug/min | INTRAVENOUS | Status: DC

## 2022-08-24 MED ORDER — NOREPINEPHRINE 4 MG/250ML-% IV SOLN
INTRAVENOUS | Status: AC
  Administered 2022-08-24: 2 ug/min via INTRAVENOUS
  Filled 2022-08-24: qty 250

## 2022-08-24 MED ORDER — SODIUM CHLORIDE 0.9 % IV SOLN
1.0000 g | Freq: Three times a day (TID) | INTRAVENOUS | Status: AC
  Administered 2022-08-24 – 2022-08-26 (×8): 1 g via INTRAVENOUS
  Filled 2022-08-24 (×9): qty 20

## 2022-08-24 MED ORDER — DEXTROSE IN LACTATED RINGERS 5 % IV SOLN: INTRAVENOUS | Status: AC

## 2022-08-24 MED ORDER — POTASSIUM CHLORIDE 10 MEQ/100ML IV SOLN
10.0000 meq | INTRAVENOUS | Status: AC
  Administered 2022-08-24 (×4): 10 meq via INTRAVENOUS
  Filled 2022-08-24 (×2): qty 100

## 2022-08-24 MED ORDER — POTASSIUM CHLORIDE 20 MEQ PO PACK
20.0000 meq | PACK | ORAL | Status: AC
  Administered 2022-08-24 (×2): 20 meq
  Filled 2022-08-24 (×2): qty 1

## 2022-08-24 NOTE — Progress Notes (Addendum)
Pharmacy Antibiotic Note  Stephanie Benton is a 72 y.o. female admitted on 08/18/2022 with  medical history significant of Essential hypertension, hyperlipidemia, bipolar disorder, AKI, chronic abdominal pain, chronic pancreatitis, esophagitis tobacco abuse, COPD, who was just discharged today to skilled nursing facility after admission with severe sepsis as a result of community-acquired pneumonia and COVID-19 infection diagnosed on January 9.  She was brought back secondary to worsening shortness of breath and hypoxia .  Pharmacy has been consulted to dose vancomycin and cefepime for pna.  1st doses given in the ED  08/24/2022 Day # 7 vancomycin & Cefepime WBC up to 17.4 SCr stable at 0.67 Tmax 101 S/p bronch 1/17 S/p EGD 1/19 Intubated, on Levo @ 1 mcg/min D/w Dr Vaughan Browner:  broaden cefepime> meropenem w/ fever & incr WBC  Plan: Continue Vancomycin 1212m IV q24h (AUC 509.2, Scr 0.91) Meropenem 1 gm IV q8h Follow renal function, cultures and clinical course  Height: 5' 5"  (165.1 cm) Weight: 77.9 kg (171 lb 11.8 oz) IBW/kg (Calculated) : 57  Temp (24hrs), Avg:97.9 F (36.6 C), Min:95.5 F (35.3 C), Max:101 F (38.3 C)  Recent Labs  Lab 08/18/22 2025 08/19/22 0030 08/19/22 0500 08/20/22 0510 08/20/22 1427 08/21/22 0922 08/22/22 0453 08/22/22 1635 08/23/22 0512 08/24/22 0302  WBC 20.1*  --    < > 10.7*   < > 8.4 9.9 12.2* 9.1 17.4*  CREATININE 0.91  --    < > 0.78  --  0.77 0.87  --  0.75 0.67  LATICACIDVEN 2.4* 1.7  --   --   --   --   --   --   --   --    < > = values in this interval not displayed.     Estimated Creatinine Clearance: 65.6 mL/min (by C-G formula based on SCr of 0.67 mg/dL).    Allergies  Allergen Reactions   Fish Allergy Anaphylaxis   Morphine And Related Anaphylaxis and Swelling   Belsomra [Suvorexant] Other (See Comments)    Nightmares     Codeine Nausea And Vomiting   Duragesic-100 [Fentanyl] Hives   Motrin [Ibuprofen] Nausea Only and Other (See  Comments)    History of stomach ulcers   Nsaids Other (See Comments)    History of stomach ulcers   Penicillins Hives    Did it involve swelling of the face/tongue/throat, SOB, or low BP? Yes Did it involve sudden or severe rash/hives, skin peeling, or any reaction on the inside of your mouth or nose? Yes Did you need to seek medical attention at a hospital or doctor's office? Yes When did it last happen? More than 10 years ago If all above answers are "NO", may proceed with cephalosporin use.    Sulfa Antibiotics Swelling    Not listed on MAR   Tolectin [Tolmetin] Other (See Comments)    History of stomach ulcers Not listed on MAR     Antimicrobials this admission: 1/14 cefepime >>1/20 1/20 meropenem>> 1/14 vancomycin >>  Dose adjustments this admission:   Microbiology results: 1/14 BCx: ngtd 1/15 MRSA PCR: positive  1/15 resp panel: negative  1/17 BAL: ngtd 1/17 AFB neg 1/17 BAL fungus:  PTA: 1/9 Covid +  Thank you for allowing pharmacy to be a part of this patient's care.   MEudelia Bunch Pharm.D Use secure chat for questions 08/24/2022 9:47 AM

## 2022-08-24 NOTE — Progress Notes (Signed)
[facility] Progress Note Patient Name: Stephanie Benton DOB: 04-Mar-1951 MRN: [id number]   Date of Service  08/24/2022  HPI/Events of Note  Patient with soft blood pressures, no evidence of bleeding.  eICU Interventions  Peripheral Norepinephrine gtt ordered.        [name] Loetta Connelley 08/24/2022, 1:57 AM

## 2022-08-24 NOTE — Progress Notes (Signed)
RT Note: CPT held at this time, RN working with pt.

## 2022-08-24 NOTE — Progress Notes (Addendum)
[facility] Progress Note Patient Name: Stephanie Benton DOB: 04-14-1951 MRN: [id number]   Date of Service  08/24/2022  HPI/Events of Note  Red alert: Camera evaluation. Vomited around ET tube, suctioning done.  VS stable. Sats 100%. Abdomen soft. NPO. EGD no active bleeding. NG tube is in place, not moved per RN.   eICU Interventions  - getting zofran. Watch for now     Intervention Category Intermediate Interventions: Other:  Elmer Sow 08/24/2022, 10:20 PM  4:35 BSRN requesting flexi seal for ongoing stool, attempted rectal pouch unsuccessful. Ordered. Follow am labs for any electrolyte imbalance from loose  bowels watch for abdominal pain or fever, if so call back.

## 2022-08-24 NOTE — Progress Notes (Signed)
NAME:  Stephanie Benton, MRN:  [id number], DOB:  April 06, 1951, LOS: 6 ADMISSION DATE:  08/18/2022, CONSULTATION DATE:  1/15 REFERRING MD:  Pokhrel, CHIEF COMPLAINT:  pneumonia  History of Present Illness:  72 y/o F with PMH significant for COPD, Bipolar Disorder, HTN, HL, pancreatitis who presented from SNF with increased dyspnea.  She was recently admitted with pneumonia and Covid-19 from 1/9-1/13, she was initially treated with aztreonam, azithromycin and vanc and discharged on omnicef and was on room air .   She returned to the ED on 1/14 with worsening dyspnea and CXR showed opacification of the L hemithorax suggestive of mucous plugging.  She required 2-3L oxygen and labs were significant for WBC of 20k, Hgb 7.3, lactic acid 2.4, procalcitonin 0.61.   She was admitted and started on Vancomycin and Cefepime along with hypertonic saline nebs, IS and chest PT.  A repeat CXR am 1/15 was unchanged, so PCCM consulted. S/p FOB on 1/17 with removal of thick secretions, failed extubation post procedure. Remained intubated in ICU. Concern for possible GIB 1/18 with significant Hgb drop, black stool.    Pertinent  Medical History   has a past medical history of Hyperlipidemia, Hypertension, and Prolonged QT interval (06/21/2022).   Significant Hospital Events: Including procedures, antibiotic start and stop dates in addition to other pertinent events   1/15 presented with L hemithorax opacification and dyspnea, PCCM consult 1/16 more somnolent with Hgb drop,CXR without improvement in L-sided opacification, transfused 2 units PRBC for hemoglobin 5.5 --> 10 1/17 bronchoscopy with removal of thick mucous plugs from left mainstem and segmental bronchi, failed extubation postprocedure and transferred to ICU 1/18 Concern for GIB, Hgb drop 5.5, GI consulted. Remains on vent  Interim History / Subjective:   Remains on the ventilator Currently on Neo-Synephrine drip and D5 overnight  Objective   Blood pressure  (!) 100/51, pulse 80, temperature 98.6 F (37 C), temperature source Oral, resp. rate 18, height 5' 5"  (1.651 m), weight 77.9 kg, SpO2 100 %.    Vent Mode: PRVC FiO2 (%):  [30 %] 30 % Set Rate:  [18 bmp] 18 bmp Vt Set:  [450 mL] 450 mL PEEP:  [5 cmH20] 5 cmH20 Plateau Pressure:  [15 cmH20-17 cmH20] 16 cmH20   Intake/Output Summary (Last 24 hours) at 08/24/2022 0808 Last data filed at 08/24/2022 0645 Gross per 24 hour  Intake 1465.92 ml  Output 1475 ml  Net -9.08 ml   Filed Weights   08/18/22 1753 08/19/22 1835 08/21/22 1545  Weight: 73.9 kg 88.8 kg 77.9 kg   Exam:  Blood pressure (!) 100/51, pulse 80, temperature 98.6 F (37 C), temperature source Oral, resp. rate 18, height 5' 5"  (1.651 m), weight 77.9 kg, SpO2 100 %. Gen:      No acute distress chronically ill-appearing HEENT:  EOMI, sclera anicteric Neck:     No masses; no thyromegaly ET tube Lungs:    Clear to auscultation bilaterally; normal respiratory effort CV:         Regular rate and rhythm; no murmurs Abd:      + bowel sounds; soft, non-tender; no palpable masses, no distension Ext:    No edema; adequate peripheral perfusion Skin:      Warm and dry; no rash Neuro: Sedated, unresponsive   Lab/imaging reviewed Significant for potassium 3.1, creatinine 0.67 WBC 17.4, hemoglobin 11.1, platelets 171 No new imaging  Resolved Hospital Problem list     Assessment & Plan:   Acute hypoxic respiratory failure, requiring  mechanical ventilation post bronchoscopy L hemithorax opacification/atelectasis due to mucous plugs Discharged 1/13 for CAP and Covid on Omnicef and re-admitted one day later. Strep pneumo and legionella were negative 5 days ago, covid +, RVP neg.  Continue vent support Weaning trials as tolerated On cefepime, vancomycin WBC count up to 17.4 today.  Will change antibiotic coverage to Vanco and meropenem Follow BAL cultures Intermittent chest x-ray  COVID + Continue airborne precautions  Acute  Metabolic Encephalopathy Spoke with patient's son, states that at baseline she is up all night and sleeps during the day, there has been recent concern for dementia.  She will normally interact in the evenings and at night -continue precedex with PRN versed/fentanyl Follow neuro exam  R/o UGI Bleed Acute blood loss anemia Hgb drop from 7.3 to 5.5 >> 2 U PRBC >> 10. No evidence of overt bleeding and no large effusion with POCUS to suggest hemothorax.  Dark stool on 1/18.  Recent EGD per Dr. Benson Norway in 07/2022.   -repeat EGD 1/19 am with gastritis LA Grade D - Hemoglobin is higher today.  Follow CBC  Hypokalemia  -monitor, replace as indicated   Best Practice (right click and "Reselect all SmartList Selections" daily)   DNR status in the event of cardiac arrest but full medical care otherwise [name] updated via phone on plan of care.   Critical Care Time:    The patient is critically ill with multiple organ system failure and requires high complexity decision making for assessment and support, frequent evaluation and titration of therapies, advanced monitoring, review of radiographic studies and interpretation of complex data.   Critical Care Time devoted to patient care services, exclusive of separately billable procedures, described in this note is 35 minutes.   Marshell Garfinkel MD [facility] See Amion for pager  If no response to pager , please call [phone] until 7pm After 7:00 pm call Elink  [phone] 08/24/2022, 8:11 AM

## 2022-08-24 NOTE — Progress Notes (Signed)
[facility] Progress Note Patient Name: JOSSELINE REDDIN DOB: [birth year] MRN: [id number]   Date of Service  08/24/2022  HPI/Events of Note  Blood sugar  74 mg / dl.  eICU Interventions  D 5 % LR @ 75 ml / hour x 24 hours ordered.        Frederik Pear 08/24/2022, 4:42 AM

## 2022-08-24 NOTE — Progress Notes (Signed)
[facility] ADULT ICU REPLACEMENT PROTOCOL   The patient does apply for the [facility] Adult ICU Electrolyte Replacment Protocol based on the criteria listed below:   1.Exclusion criteria: TCTS, ECMO, Dialysis, and Myasthenia Gravis patients 2. Is GFR >/= 30 ml/min? Yes.    Patient's GFR today is > 60 3. Is SCr </= 2? Yes.   Patient's SCr is 0.67 mg/dL 4. Did SCr increase >/= 0.5 in 24 hours? No. 5.Pt's weight >40kg  Yes.   6. Abnormal electrolyte(s): K+ 3.1  7. Electrolytes replaced per protocol 8.  Call MD STAT for K+ </= 2.5, Phos </= 1, or Mag </= 1 Physician:  Dr York Ram, Elfredia Nevins 08/24/2022 5:12 AM

## 2022-08-24 NOTE — Progress Notes (Addendum)
After receiving PT medications, patient began actively vomiting around her tube. Immediately suctioned patient's OP, and sustained d/t vomiting. Patient gagging around tube. Patient has not been on sedation today. Patient became agitated during event, attempting to talk around tube and having difficulty following commands. Precedex restarted and versed and zofran given for patient discomfort. Green button hit on wall for Elink alert.

## 2022-08-25 ENCOUNTER — Inpatient Hospital Stay (HOSPITAL_COMMUNITY): Payer: 59

## 2022-08-25 ENCOUNTER — Encounter (HOSPITAL_COMMUNITY): Payer: Self-pay | Admitting: Gastroenterology

## 2022-08-25 DIAGNOSIS — R6520 Severe sepsis without septic shock: Secondary | ICD-10-CM | POA: Diagnosis not present

## 2022-08-25 DIAGNOSIS — A419 Sepsis, unspecified organism: Secondary | ICD-10-CM | POA: Diagnosis not present

## 2022-08-25 LAB — CBC
HCT: 32.9 % — ABNORMAL LOW (ref 36.0–46.0)
Hemoglobin: 10.4 g/dL — ABNORMAL LOW (ref 12.0–15.0)
MCH: 29.1 pg (ref 26.0–34.0)
MCHC: 31.6 g/dL (ref 30.0–36.0)
MCV: 91.9 fL (ref 80.0–100.0)
Platelets: 93 10*3/uL — ABNORMAL LOW (ref 150–400)
RBC: 3.58 MIL/uL — ABNORMAL LOW (ref 3.87–5.11)
RDW: 18.4 % — ABNORMAL HIGH (ref 11.5–15.5)
WBC: 27.3 10*3/uL — ABNORMAL HIGH (ref 4.0–10.5)
nRBC: 0 % (ref 0.0–0.2)

## 2022-08-25 LAB — MAGNESIUM
Magnesium: 2 mg/dL (ref 1.7–2.4)
Magnesium: 1.9 mg/dL (ref 1.7–2.4)

## 2022-08-25 LAB — BASIC METABOLIC PANEL
Anion gap: 9 (ref 5–15)
BUN: 16 mg/dL (ref 8–23)
CO2: 16 mmol/L — ABNORMAL LOW (ref 22–32)
Calcium: 7.8 mg/dL — ABNORMAL LOW (ref 8.9–10.3)
Chloride: 118 mmol/L — ABNORMAL HIGH (ref 98–111)
Creatinine, Ser: 0.78 mg/dL (ref 0.44–1.00)
GFR, Estimated: >60 mL/min (ref >60)
Glucose, Bld: 79 mg/dL (ref 70–99)
Potassium: 3.2 mmol/L — ABNORMAL LOW (ref 3.5–5.1)
Sodium: 143 mmol/L (ref 135–145)

## 2022-08-25 LAB — CULTURE, RESPIRATORY W GRAM STAIN

## 2022-08-25 LAB — GLUCOSE, CAPILLARY
Glucose-Capillary: 89 mg/dL (ref 70–99)
Glucose-Capillary: 88 mg/dL (ref 70–99)
Glucose-Capillary: 77 mg/dL (ref 70–99)
Glucose-Capillary: 86 mg/dL (ref 70–99)
Glucose-Capillary: 102 mg/dL — ABNORMAL HIGH (ref 70–99)

## 2022-08-25 LAB — PHOSPHORUS
Phosphorus: 2.2 mg/dL — ABNORMAL LOW (ref 2.5–4.6)
Phosphorus: 2.3 mg/dL — ABNORMAL LOW (ref 2.5–4.6)

## 2022-08-25 LAB — ANAEROBIC CULTURE W GRAM STAIN

## 2022-08-25 LAB — TRIGLYCERIDES: Triglycerides: 58 mg/dL (ref <150)

## 2022-08-25 MED ORDER — CHLORHEXIDINE GLUCONATE CLOTH 2 % EX PADS
6.0000 | MEDICATED_PAD | Freq: Every day | CUTANEOUS | Status: DC
  Administered 2022-08-25 (×2): 6 via TOPICAL

## 2022-08-25 MED ORDER — POTASSIUM CHLORIDE 20 MEQ PO PACK
40.0000 meq | PACK | Freq: Two times a day (BID) | ORAL | Status: AC
  Administered 2022-08-25 (×2): 40 meq
  Filled 2022-08-25 (×2): qty 2

## 2022-08-25 MED ORDER — VITAL HIGH PROTEIN PO LIQD
1000.0000 mL | ORAL | Status: DC
  Administered 2022-08-25 – 2022-08-26 (×2): 1000 mL

## 2022-08-25 MED ORDER — ENOXAPARIN SODIUM 40 MG/0.4ML IJ SOSY
40.0000 mg | PREFILLED_SYRINGE | INTRAMUSCULAR | Status: DC
  Administered 2022-08-25 – 2022-08-26 (×2): 40 mg via SUBCUTANEOUS
  Filled 2022-08-25 (×2): qty 0.4

## 2022-08-25 MED ORDER — HYDROMORPHONE HCL 1 MG/ML IJ SOLN
0.5000 mg | INTRAMUSCULAR | Status: AC | PRN
  Administered 2022-08-25 – 2022-08-26 (×2): 1 mg via INTRAVENOUS
  Filled 2022-08-25: qty 1

## 2022-08-25 MED ORDER — DEXTROSE IN LACTATED RINGERS 5 % IV SOLN: INTRAVENOUS | Status: AC

## 2022-08-25 MED ORDER — PROSOURCE TF20 ENFIT COMPATIBL EN LIQD
60.0000 mL | Freq: Every day | ENTERAL | Status: DC
  Administered 2022-08-25 – 2022-08-26 (×2): 60 mL
  Filled 2022-08-25 (×2): qty 60

## 2022-08-25 NOTE — Progress Notes (Signed)
[facility] Progress Note Patient Name: Stephanie Benton DOB: 1951/06/08 MRN: [id number]   Date of Service  08/25/2022  HPI/Events of Note  Pt tachycardic to the 130s, SBP 170/92 Rhythm appears to be sinus tach. RN reports intermittent runs of SVT as well.  Pt awake, not following commands.  Pt intubated, continuous sedation has been held.   eICU Interventions  Pt planned for possible extubation, trying to minimize sedation.  Reordered PRN dilaudid 0.5-1mg .         Terald Jump M DELA CRUZ 08/25/2022, 10:01 PM

## 2022-08-25 NOTE — Progress Notes (Addendum)
Patient noted to have had a bowel movement. Patient cleaned and bed changed. Discussed flexiseal with Elink - Flexiseal ordered and completed for patient's comfort. Patient also noted to have serous blisters and weeping from right arm. Arm wrapped in gauze.

## 2022-08-25 NOTE — Progress Notes (Addendum)
NAME:  Stephanie Benton, MRN:  [id number], DOB:  05-13-1951, LOS: 7 ADMISSION DATE:  08/18/2022, CONSULTATION DATE:  1/15 REFERRING MD:  Pokhrel, CHIEF COMPLAINT:  pneumonia  History of Present Illness:  72 y/o F with PMH significant for COPD, Bipolar Disorder, HTN, HL, pancreatitis who presented from SNF with increased dyspnea.  She was recently admitted with pneumonia and Covid-19 from 1/9-1/13, she was initially treated with aztreonam, azithromycin and vanc and discharged on omnicef and was on room air .   She returned to the ED on 1/14 with worsening dyspnea and CXR showed opacification of the L hemithorax suggestive of mucous plugging.  She required 2-3L oxygen and labs were significant for WBC of 20k, Hgb 7.3, lactic acid 2.4, procalcitonin 0.61.   She was admitted and started on Vancomycin and Cefepime along with hypertonic saline nebs, IS and chest PT.  A repeat CXR am 1/15 was unchanged, so PCCM consulted. S/p FOB on 1/17 with removal of thick secretions, failed extubation post procedure. Remained intubated in ICU. Concern for possible GIB 1/18 with significant Hgb drop, black stool.    Pertinent  Medical History   has a past medical history of Hyperlipidemia, Hypertension, and Prolonged QT interval (06/21/2022).   Significant Hospital Events: Including procedures, antibiotic start and stop dates in addition to other pertinent events   1/15 presented with L hemithorax opacification and dyspnea, PCCM consult 1/16 more somnolent with Hgb drop,CXR without improvement in L-sided opacification, transfused 2 units PRBC for hemoglobin 5.5 --> 10 1/17 bronchoscopy with removal of thick mucous plugs from left mainstem and segmental bronchi, failed extubation postprocedure and transferred to ICU 1/18 Concern for GIB, Hgb drop 5.5, GI consulted. Remains on vent 1/20 antibiotics broadened to meropenem due to fevers and leukocytosis  Interim History / Subjective:   Remains on the ventilator Weaned  off pressors and Precedex More responsive today  Objective   Blood pressure 95/64, pulse 80, temperature 98.1 F (36.7 C), resp. rate (!) 23, height 5' 5"  (1.651 m), weight 77.9 kg, SpO2 99 %.    Vent Mode: CPAP;PSV FiO2 (%):  [30 %] 30 % Set Rate:  [18 bmp] 18 bmp Vt Set:  [450 mL] 450 mL PEEP:  [5 cmH20] 5 cmH20 Pressure Support:  [8 cmH20-10 cmH20] 8 cmH20 Plateau Pressure:  [14 cmH20-16 cmH20] 16 cmH20   Intake/Output Summary (Last 24 hours) at 08/25/2022 0926 Last data filed at 08/25/2022 0644 Gross per 24 hour  Intake 2799 ml  Output 840 ml  Net 1959 ml   Filed Weights   08/18/22 1753 08/19/22 1835 08/21/22 1545  Weight: 73.9 kg 88.8 kg 77.9 kg   Exam:  Blood pressure 95/64, pulse 80, temperature 98.1 F (36.7 C), resp. rate (!) 23, height 5' 5"  (1.651 m), weight 77.9 kg, SpO2 99 %. Gen:      No acute distress, chronically ill-appearing HEENT:  EOMI, sclera anicteric Neck:     No masses; no thyromegaly, ET tube Lungs:    Clear to auscultation bilaterally; normal respiratory effort CV:         Regular rate and rhythm; no murmurs Abd:      + bowel sounds; soft, non-tender; no palpable masses, no distension Ext:    No edema; adequate peripheral perfusion Skin:      Warm and dry; no rash Neuro: Opens eyes, does not follow commands consistently.   Lab/imaging reviewed Labs are pending Chest x-ray with left lower lobe airspace disease, small effusion  Resolved Hospital  Problem list     Assessment & Plan:   Acute hypoxic respiratory failure, requiring mechanical ventilation post bronchoscopy L hemithorax opacification/atelectasis due to mucous plugs Discharged 1/13 for CAP and Covid on Omnicef and re-admitted one day later. Strep pneumo and legionella were negative 5 days ago, covid +, RVP neg.  Continue vent support Weaning trials as tolerated Cefepime changed to meropenem on 1/20.  Continue vancomycin Follow BAL cultures Intermittent chest x-ray  COVID  + Continue airborne precautions  Acute Metabolic Encephalopathy Spoke with patient's son, states that at baseline she is up all night and sleeps during the day, there has been recent concern for dementia.  She will normally interact in the evenings and at night -continue precedex with PRN versed/fentanyl Follow neuro exam  R/o UGI Bleed Acute blood loss anemia Hgb drop from 7.3 to 5.5 >> 2 U PRBC >> 10. No evidence of overt bleeding and no large effusion with POCUS to suggest hemothorax.  Dark stool on 1/18.  Recent EGD per Dr. Benson Norway in 07/2022.   -repeat EGD 1/19 am with gastritis LA Grade D - Hemoglobin is higher today.  Follow CBC  Hypokalemia  -monitor, replace as indicated   Malnutrition Present on admission Start tube feeds.  Nutrition consult  Best Practice (right click and "Reselect all SmartList Selections" daily)   Diet/type: tubefeeds DVT prophylaxis: LMWH GI prophylaxis: PPI Lines: N/A Foley:  Yes, and it is still needed Code Status:  DNR Last date of multidisciplinary goals of care discussion []  DNR status in the event of cardiac arrest but full medical care otherwise [name] updated via phone on 1/21  Critical Care Time:    The patient is critically ill with multiple organ system failure and requires high complexity decision making for assessment and support, frequent evaluation and titration of therapies, advanced monitoring, review of radiographic studies and interpretation of complex data.   Critical Care Time devoted to patient care services, exclusive of separately billable procedures, described in this note is 35 minutes.   Marshell Garfinkel MD [facility] See Amion for pager  If no response to pager , please call [phone] until 7pm After 7:00 pm call Elink  [phone] 08/25/2022, 9:26 AM    .pccm

## 2022-08-26 ENCOUNTER — Inpatient Hospital Stay: Payer: Self-pay

## 2022-08-26 DIAGNOSIS — A419 Sepsis, unspecified organism: Secondary | ICD-10-CM | POA: Diagnosis not present

## 2022-08-26 DIAGNOSIS — R6520 Severe sepsis without septic shock: Secondary | ICD-10-CM | POA: Diagnosis not present

## 2022-08-26 LAB — GLUCOSE, CAPILLARY
Glucose-Capillary: 118 mg/dL — ABNORMAL HIGH (ref 70–99)
Glucose-Capillary: 108 mg/dL — ABNORMAL HIGH (ref 70–99)
Glucose-Capillary: 115 mg/dL — ABNORMAL HIGH (ref 70–99)
Glucose-Capillary: 110 mg/dL — ABNORMAL HIGH (ref 70–99)
Glucose-Capillary: 106 mg/dL — ABNORMAL HIGH (ref 70–99)
Glucose-Capillary: 84 mg/dL (ref 70–99)

## 2022-08-26 LAB — ANAEROBIC CULTURE W GRAM STAIN

## 2022-08-26 LAB — BASIC METABOLIC PANEL
Anion gap: 6 (ref 5–15)
BUN: 27 mg/dL — ABNORMAL HIGH (ref 8–23)
CO2: 20 mmol/L — ABNORMAL LOW (ref 22–32)
Calcium: 7.7 mg/dL — ABNORMAL LOW (ref 8.9–10.3)
Chloride: 118 mmol/L — ABNORMAL HIGH (ref 98–111)
Creatinine, Ser: 0.93 mg/dL (ref 0.44–1.00)
GFR, Estimated: >60 mL/min (ref >60)
Glucose, Bld: 129 mg/dL — ABNORMAL HIGH (ref 70–99)
Potassium: 3.9 mmol/L (ref 3.5–5.1)
Sodium: 144 mmol/L (ref 135–145)

## 2022-08-26 LAB — CBC
HCT: 27.3 % — ABNORMAL LOW (ref 36.0–46.0)
Hemoglobin: 8.5 g/dL — ABNORMAL LOW (ref 12.0–15.0)
MCH: 28.8 pg (ref 26.0–34.0)
MCHC: 31.1 g/dL (ref 30.0–36.0)
MCV: 92.5 fL (ref 80.0–100.0)
Platelets: 109 10*3/uL — ABNORMAL LOW (ref 150–400)
RBC: 2.95 MIL/uL — ABNORMAL LOW (ref 3.87–5.11)
RDW: 18.4 % — ABNORMAL HIGH (ref 11.5–15.5)
WBC: 21.5 10*3/uL — ABNORMAL HIGH (ref 4.0–10.5)
nRBC: 0 % (ref 0.0–0.2)

## 2022-08-26 LAB — BRAIN NATRIURETIC PEPTIDE: B Natriuretic Peptide: 131.5 pg/mL — ABNORMAL HIGH (ref 0.0–100.0)

## 2022-08-26 LAB — MAGNESIUM: Magnesium: 2.1 mg/dL (ref 1.7–2.4)

## 2022-08-26 LAB — PHOSPHORUS: Phosphorus: 2.2 mg/dL — ABNORMAL LOW (ref 2.5–4.6)

## 2022-08-26 MED ORDER — DEXTROSE IN LACTATED RINGERS 5 % IV SOLN: INTRAVENOUS | Status: DC

## 2022-08-26 MED ORDER — SODIUM CHLORIDE 0.9% FLUSH: 10.0000 mL | INTRAVENOUS | Status: DC | PRN

## 2022-08-26 MED ORDER — FUROSEMIDE 10 MG/ML IJ SOLN
40.0000 mg | Freq: Once | INTRAMUSCULAR | Status: AC
  Administered 2022-08-26: 40 mg via INTRAVENOUS
  Filled 2022-08-26: qty 4

## 2022-08-26 MED ORDER — CARVEDILOL 12.5 MG PO TABS
12.5000 mg | ORAL_TABLET | Freq: Two times a day (BID) | ORAL | Status: DC
  Administered 2022-08-26 – 2022-08-27 (×3): 12.5 mg
  Filled 2022-08-26 (×3): qty 1

## 2022-08-26 MED ORDER — CHLORHEXIDINE GLUCONATE CLOTH 2 % EX PADS
6.0000 | MEDICATED_PAD | Freq: Every day | CUTANEOUS | Status: DC
  Administered 2022-08-26 – 2022-09-12 (×16): 6 via TOPICAL

## 2022-08-26 MED ORDER — POTASSIUM PHOSPHATES 15 MMOLE/5ML IV SOLN
15.0000 mmol | Freq: Once | INTRAVENOUS | Status: AC
  Administered 2022-08-26: 15 mmol via INTRAVENOUS
  Filled 2022-08-26: qty 5

## 2022-08-26 MED ORDER — SODIUM CHLORIDE 0.9% FLUSH
10.0000 mL | Freq: Two times a day (BID) | INTRAVENOUS | Status: DC
  Administered 2022-08-26 – 2022-08-27 (×3): 10 mL
  Administered 2022-08-28: 20 mL
  Administered 2022-08-28 – 2022-08-29 (×3): 10 mL
  Administered 2022-08-30: 20 mL
  Administered 2022-08-30 – 2022-09-02 (×4): 10 mL

## 2022-08-26 MED ORDER — LOPERAMIDE HCL 1 MG/7.5ML PO SUSP
2.0000 mg | ORAL | Status: DC | PRN
  Administered 2022-08-26: 2 mg
  Filled 2022-08-26 (×2): qty 15

## 2022-08-26 NOTE — Procedures (Signed)
Extubation Procedure Note  Patient Details:   Name: MARILOUISE DENSMORE DOB: 06-01-1951 MRN: [id number]   Airway Documentation:    Vent end date: 08/26/22 Vent end time: 1645   Evaluation  O2 sats: stable throughout Complications: No apparent complications Patient did tolerate procedure well. Bilateral Breath Sounds: Diminished, Clear   Yes  Rollo Farquhar A Berthold Glace 08/26/2022, 4:53 PM

## 2022-08-26 NOTE — Progress Notes (Signed)
[facility] Progress Note Patient Name: Stephanie Benton DOB: 1951-01-21 MRN: [id number]   Date of Service  08/26/2022  HPI/Events of Note  CBG 80s and 70s On TF  eICU Interventions  D5 1/2 NS @10cc /h started Trend CBG      Intervention Category Intermediate Interventions: Hyperglycemia - evaluation and treatment  [name] 08/26/2022, 11:17 PM

## 2022-08-26 NOTE — Progress Notes (Signed)
Patient noted to still be tachycardic @ 120 despite comfort medications, RASS -1. Patient is currently afebrile, temp 98.6. Patient has been oliguric, and has generalized +3 edema with some associated hypertension. [facility] contacted for request for diuresis. Patient is 7L+ according to the chart. Unable to obtain CVP d/t lack of CVC.

## 2022-08-26 NOTE — Progress Notes (Signed)
Phlebotomy team has been unable to stick patient since yesterday AM due to severe edema and limited access. CCM notified by this RN; orders received for PICC placement.

## 2022-08-26 NOTE — Progress Notes (Signed)
[name] DOB: [birth year] MRN: [id number]   Date of Service  08/26/2022  [name], [name], [name], [name] No CVC to CVP check.  CXR with layering effusion L>R  On D5 LR 75/hr for hypoglycemia Started on tube feeding, glucose 102  eICU Interventions  Decrease D5 LR to 40 /hr and consider discontinuing once sugars are stable Ordered Lasix 40 mg IV. Already on electrolyte replacement protocol. Add on BNP Discussed with bedside RN     Intervention Category Intermediate Interventions: Hypervolemia - evaluation and management  Judd Lien 08/26/2022, 2:31 AM

## 2022-08-26 NOTE — Progress Notes (Signed)
Patient extubated at 1645 by RT per orders. Patient tolerated procedure well, and is on 3L [place] with O2 sat 100%.   Prior to extubation, this RN and charge RN attempted panda tube insertion x3. Hard resistance was met each time as well as slight hemoptysis. CCM notified by this RN.

## 2022-08-26 NOTE — Progress Notes (Signed)
Peripherally Inserted Central Catheter Placement  The IV Nurse has discussed with the patient and/or persons authorized to consent for the patient, the purpose of this procedure and the potential benefits and risks involved with this procedure.  The benefits include less needle sticks, lab draws from the catheter, and the patient may be discharged home with the catheter. Risks include, but not limited to, infection, bleeding, blood clot (thrombus formation), and puncture of an artery; nerve damage and irregular heartbeat and possibility to perform a PICC exchange if needed/ordered by physician.  Alternatives to this procedure were also discussed.  Bard Power PICC patient education guide, fact sheet on infection prevention and patient information card has been provided to patient /or left at bedside.    PICC Placement Documentation  PICC Double Lumen 08/26/22 Left Brachial 41 cm 0 cm (Active)  Indication for Insertion or Continuance of Line Prolonged intravenous therapies 08/26/22 1347  Exposed Catheter (cm) 0 cm 08/26/22 1347  Site Assessment Clean, Dry, Intact 08/26/22 1347  Lumen #1 Status Flushed;Blood return noted;Saline locked 08/26/22 1347  Lumen #2 Status Flushed;Blood return noted;Saline locked 08/26/22 1347  Dressing Type Transparent 08/26/22 1347  Dressing Status Antimicrobial disc in place 08/26/22 [place] Not Applicable 40/98/11 9147  Line Care Connections checked and tightened 08/26/22 1347  Dressing Change Due 09/02/22 08/26/22 1347       Scotty Court 08/26/2022, 1:49 PM

## 2022-08-26 NOTE — Progress Notes (Signed)
Brief Nutrition Note  Received consult for Enteral/tube feeding initiation and management. Patient being followed by RD, last assessment note 1/18. Patient started on Adult Tube Feed protocol of Vital high protein at 37mL/hr yesterday, 1/21.   Per CCM NP, hopeful for extubation today.   If patient remains intubated, recommend change to below TF regimen: Osmolite 1.5 at 55 ml/h (1320 ml per day) *Recommend starting at 32mL/hr and advancing by 68mL Q8H d/t risk of refeeding Prosource TF20 60 ml daily Provides 2060 kcal, 103 gm protein, 1006 ml free water daily    - Recommend 100mg  thiamine x5 days. - Monitor magnesium, potassium, and phosphorus BID for at least 3 days, MD to replete as needed, as pt is at risk for refeeding syndrome. - Monitor weight trends.    - Patient noted to have several vitamin deficiencies: vitamin A, D, E, K, and C.              - Patient currently receiving: 50000 units vitamin D weekly, 500mg  vitamin C daily, 400 units vitamin E, and MVI - Patient needs vitamin A and K supplementation.  - Will defer vitamin K supplementation to CCM. - Vitamin A flags for fish allergy.   - Per discussion with pharmacy last week, can try and talk with patient after extubation to obtain details about fish allergy. Unfortunately there is not enough IV vitamin A for a full course to correct deficiency.  - Multivitamin tablet patient is receiving has 5000 units of vitamin A. Patient may need further repletion after discharge via IV if unable to obtain more information about fish allergy.    Samson Frederic RD, LDN For contact information, refer to [facility].

## 2022-08-26 NOTE — Progress Notes (Addendum)
NAME:  Stephanie Benton, MRN:  [id number], DOB:  11-14-1950, LOS: 8 ADMISSION DATE:  08/18/2022, CONSULTATION DATE:  1/15 REFERRING MD:  Pokhrel, CHIEF COMPLAINT:  pneumonia  History of Present Illness:  72 y/o F with PMH significant for COPD, Bipolar Disorder, HTN, HL, pancreatitis who presented from SNF with increased dyspnea.  She was recently admitted with pneumonia and COVID-19 from 1/9-1/13, she was initially treated with aztreonam, azithromycin and vanc and discharged on omnicef and was on room air .   She returned to the ED on 1/14 with worsening dyspnea and CXR showed opacification of the L hemithorax suggestive of mucous plugging.  She required 2-3L oxygen and labs were significant for WBC of 20k, Hgb 7.3, lactic acid 2.4, procalcitonin 0.61.   She was admitted and started on Vancomycin and Cefepime along with hypertonic saline nebs, IS and chest PT.  A repeat CXR am 1/15 was unchanged, so PCCM consulted. S/p FOB on 1/17 with removal of thick secretions, failed extubation post procedure. Remained intubated in ICU. Concern for possible GIB 1/18 with significant Hgb drop, black stool.    Pertinent  Medical History   has a past medical history of Hyperlipidemia, Hypertension, and Prolonged QT interval (06/21/2022).   Significant Hospital Events: Including procedures, antibiotic start and stop dates in addition to other pertinent events   1/15 presented with L hemithorax opacification and dyspnea, PCCM consult 1/16 more somnolent with Hgb drop,CXR without improvement in L-sided opacification, transfused 2 units PRBC for hemoglobin 5.5 --> 10 1/17 bronchoscopy with removal of thick mucous plugs from left mainstem and segmental bronchi, failed extubation postprocedure and transferred to ICU 1/18 Concern for GIB, Hgb drop 5.5, GI consulted. Remains on vent 1/20 antibiotics broadened to meropenem due to fevers and leukocytosis 1/21 Remains on vent, off pressors / precedex, more  responsive  Interim History / Subjective:  Tmax 99.3  Vent - PEEP 5/FiO2 30% > on PSV 10/5 Glucose range 101-118 I/O 1.4L UOP, 200 ml stool, +1.3L in last 24 hours  Objective   Blood pressure (!) 160/93, pulse (!) 128, temperature 99.3 F (37.4 C), resp. rate 17, height 5' 5"  (1.651 m), weight 84.4 kg, SpO2 100 %.    Vent Mode: PSV;CPAP FiO2 (%):  [30 %] 30 % Set Rate:  [18 bmp] 18 bmp Vt Set:  [450 mL] 450 mL PEEP:  [5 cmH20] 5 cmH20 Pressure Support:  [8 cmH20-10 cmH20] 10 cmH20 Plateau Pressure:  [14 cmH20-18 cmH20] 16 cmH20   Intake/Output Summary (Last 24 hours) at 08/26/2022 0810 Last data filed at 08/26/2022 0600 Gross per 24 hour  Intake 2990.56 ml  Output 1675 ml  Net 1315.56 ml   Filed Weights   08/19/22 1835 08/21/22 1545 08/26/22 0555  Weight: 88.8 kg 77.9 kg 84.4 kg   Exam:  General: elderly adult female lying in bed in NAD on vent    HEENT: MM pink/moist, ETT, anicteric  Neuro: awake, alert, following commands CV: s1s2 RRR, no m/r/g PULM: non-labored at rest, lungs bilaterally diminished.  Assessed left pleural space with [name], small pocket of free flowing pleural fluid GI: soft, bsx4 active, NGT Extremities: warm/dry, trace to 1+ edema  Skin: no rashes or lesions  Labs pending No CXR 1/22   Resolved Hospital Problem list     Assessment & Plan:   Acute hypoxic respiratory failure, requiring mechanical ventilation post bronchoscopy L hemithorax opacification/atelectasis due to mucous plugging.  Left Pleural Effusion  Discharged 1/13 for CAP and COVID on Omnicef  and re-admitted one day later. Strep pneumo and legionella were negative 5 days ago, covid +, RVP neg. Significant mucus plugging requiring FOB.  -PRVC with LTVV  -PSV wean with goal for extubation -confirmed with son, if extubated, no reintubation  -follow BAL -follow intermittent CXR -D9 abx, stop after 1/22 dosing  -pending labs, additional lasix 1/22   COVID + -airborne precautions    Acute Metabolic Encephalopathy Spoke with patient's son, states that at baseline she is up all night and sleeps during the day, there has been recent concern for dementia.  She will normally interact in the evenings and at night.  Needs facility.  Notes significant decline in functional status after a fall.  -follow neuro exam, improved off sedation  -limit sedating medications  -RASS Goal 0 to -1   R/o UGI Bleed Acute blood loss anemia Hgb drop from 7.3 to 5.5 >> 2 U PRBC >> 10. No evidence of overt bleeding and no large effusion with POCUS to suggest hemothorax.  Dark stool on 1/18.  Recent EGD per Dr. Benson Norway in 07/2022.  Repeat EGD 1/19 am with gastritis LA Grade D -appreciate GI assistance with patient care  -trend CBC   HTN  ST -resume home coreg at 1/2 dose  -continue cozaar   Hypokalemia  Hypophosphatemia -monitor, replace as indicated  -Kphos 1/22  -pending labs, may need further diuresis / replacement   Malnutrition (POA) -TF per Nutrition   Best Practice (right click and "Reselect all SmartList Selections" daily)  Diet/type: tubefeeds DVT prophylaxis: LMWH GI prophylaxis: PPI Lines: N/A Foley:  Yes, and it is still needed Code Status:  DNR Last date of multidisciplinary goals of care discussion: DNR status in the event of cardiac arrest but full medical care otherwise. No reintubation if fails extubation.  Would be ok with transition to comfort measures if fails.  [name] updated via phone 1/22.     Critical Care Time: 89 minutes    Noe Gens, MSN, APRN, NP-C, AGACNP-BC [facility] 08/26/2022, 8:11 AM   Please see [email or web address] for pager details.   From 7A-7P if no response, please call [phone] After hours, please call ELink [phone]

## 2022-08-27 ENCOUNTER — Inpatient Hospital Stay (HOSPITAL_COMMUNITY): Payer: 59

## 2022-08-27 DIAGNOSIS — R6520 Severe sepsis without septic shock: Secondary | ICD-10-CM | POA: Diagnosis not present

## 2022-08-27 DIAGNOSIS — A419 Sepsis, unspecified organism: Secondary | ICD-10-CM | POA: Diagnosis not present

## 2022-08-27 LAB — CBC
HCT: 27.9 % — ABNORMAL LOW (ref 36.0–46.0)
Hemoglobin: 8.7 g/dL — ABNORMAL LOW (ref 12.0–15.0)
MCH: 29 pg (ref 26.0–34.0)
MCHC: 31.2 g/dL (ref 30.0–36.0)
MCV: 93 fL (ref 80.0–100.0)
Platelets: 128 10*3/uL — ABNORMAL LOW (ref 150–400)
RBC: 3 MIL/uL — ABNORMAL LOW (ref 3.87–5.11)
RDW: 18.4 % — ABNORMAL HIGH (ref 11.5–15.5)
WBC: 17.9 10*3/uL — ABNORMAL HIGH (ref 4.0–10.5)
nRBC: 0 % (ref 0.0–0.2)

## 2022-08-27 LAB — GLUCOSE, CAPILLARY
Glucose-Capillary: 71 mg/dL (ref 70–99)
Glucose-Capillary: 113 mg/dL — ABNORMAL HIGH (ref 70–99)
Glucose-Capillary: 71 mg/dL (ref 70–99)
Glucose-Capillary: 78 mg/dL (ref 70–99)
Glucose-Capillary: 83 mg/dL (ref 70–99)
Glucose-Capillary: 98 mg/dL (ref 70–99)
Glucose-Capillary: 101 mg/dL — ABNORMAL HIGH (ref 70–99)

## 2022-08-27 LAB — BASIC METABOLIC PANEL
Anion gap: 5 (ref 5–15)
BUN: 24 mg/dL — ABNORMAL HIGH (ref 8–23)
CO2: 20 mmol/L — ABNORMAL LOW (ref 22–32)
Calcium: 8.2 mg/dL — ABNORMAL LOW (ref 8.9–10.3)
Chloride: 119 mmol/L — ABNORMAL HIGH (ref 98–111)
Creatinine, Ser: 0.78 mg/dL (ref 0.44–1.00)
GFR, Estimated: >60 mL/min (ref >60)
Glucose, Bld: 123 mg/dL — ABNORMAL HIGH (ref 70–99)
Potassium: 3.2 mmol/L — ABNORMAL LOW (ref 3.5–5.1)
Sodium: 144 mmol/L (ref 135–145)

## 2022-08-27 LAB — PHOSPHORUS: Phosphorus: 2.4 mg/dL — ABNORMAL LOW (ref 2.5–4.6)

## 2022-08-27 LAB — MAGNESIUM: Magnesium: 2 mg/dL (ref 1.7–2.4)

## 2022-08-27 MED ORDER — CARVEDILOL 12.5 MG PO TABS
12.5000 mg | ORAL_TABLET | Freq: Two times a day (BID) | ORAL | Status: DC
  Administered 2022-08-27 – 2022-09-05 (×18): 12.5 mg via ORAL
  Filled 2022-08-27 (×18): qty 1

## 2022-08-27 MED ORDER — PROSOURCE PLUS PO LIQD
30.0000 mL | Freq: Two times a day (BID) | ORAL | Status: DC
  Administered 2022-08-27 – 2022-09-05 (×14): 30 mL via ORAL
  Filled 2022-08-27 (×14): qty 30

## 2022-08-27 MED ORDER — BOOST / RESOURCE BREEZE PO LIQD CUSTOM
1.0000 | Freq: Three times a day (TID) | ORAL | Status: DC
  Administered 2022-08-27 – 2022-09-05 (×22): 1 via ORAL

## 2022-08-27 MED ORDER — DOCUSATE SODIUM 50 MG/5ML PO LIQD: 100.0000 mg | Freq: Two times a day (BID) | ORAL | Status: DC | PRN

## 2022-08-27 MED ORDER — FOLIC ACID 1 MG PO TABS
1.0000 mg | ORAL_TABLET | Freq: Every day | ORAL | Status: DC
  Administered 2022-08-27 – 2022-09-04 (×7): 1 mg via ORAL
  Filled 2022-08-27 (×6): qty 1

## 2022-08-27 MED ORDER — POTASSIUM CHLORIDE 10 MEQ/50ML IV SOLN
10.0000 meq | INTRAVENOUS | Status: AC
  Administered 2022-08-27 (×6): 10 meq via INTRAVENOUS
  Filled 2022-08-27 (×6): qty 50

## 2022-08-27 MED ORDER — ORAL CARE MOUTH RINSE: 15.0000 mL | OROMUCOSAL | Status: DC | PRN

## 2022-08-27 MED ORDER — LOSARTAN POTASSIUM 50 MG PO TABS
50.0000 mg | ORAL_TABLET | Freq: Every day | ORAL | Status: DC
  Administered 2022-08-28 – 2022-09-11 (×15): 50 mg via ORAL
  Filled 2022-08-27 (×15): qty 1

## 2022-08-27 MED ORDER — FERROUS SULFATE 300 (60 FE) MG/5ML PO SOLN
300.0000 mg | Freq: Two times a day (BID) | ORAL | Status: DC
  Administered 2022-08-27 – 2022-09-05 (×18): 300 mg via ORAL
  Filled 2022-08-27 (×21): qty 5

## 2022-08-27 MED ORDER — LOPERAMIDE HCL 1 MG/7.5ML PO SUSP: 2.0000 mg | ORAL | Status: DC | PRN

## 2022-08-27 MED ORDER — ADULT MULTIVITAMIN W/MINERALS CH
1.0000 | ORAL_TABLET | Freq: Every day | ORAL | Status: DC
  Administered 2022-08-27 – 2022-09-05 (×10): 1 via ORAL
  Filled 2022-08-27 (×9): qty 1

## 2022-08-27 MED ORDER — FUROSEMIDE 10 MG/ML IJ SOLN
40.0000 mg | Freq: Once | INTRAMUSCULAR | Status: AC
  Administered 2022-08-27: 40 mg via INTRAVENOUS
  Filled 2022-08-27: qty 4

## 2022-08-27 MED ORDER — POLYETHYLENE GLYCOL 3350 17 G PO PACK
17.0000 g | PACK | Freq: Every day | ORAL | Status: DC
  Administered 2022-08-30 – 2022-09-13 (×12): 17 g via ORAL
  Filled 2022-08-27 (×13): qty 1

## 2022-08-27 MED ORDER — VITAMIN C 500 MG PO TABS
500.0000 mg | ORAL_TABLET | Freq: Every day | ORAL | Status: DC
  Administered 2022-08-28 – 2022-09-04 (×8): 500 mg via ORAL
  Filled 2022-08-27 (×9): qty 1

## 2022-08-27 MED ORDER — VITAMIN E 6.75 MG/0.3ML PO SOLN
400.0000 [IU] | Freq: Every day | ORAL | Status: DC
  Administered 2022-08-28 – 2022-09-05 (×9): 400 [IU] via ORAL
  Filled 2022-08-27 (×9): qty 8

## 2022-08-27 NOTE — Progress Notes (Signed)
NAME:  Stephanie Benton, MRN:  [id number], DOB:  1950/08/23, LOS: 9 ADMISSION DATE:  08/18/2022, CONSULTATION DATE:  1/15 REFERRING MD:  Pokhrel, CHIEF COMPLAINT:  pneumonia  History of Present Illness:  72 y/o F with PMH significant for COPD, Bipolar Disorder, HTN, HL, pancreatitis who presented from SNF with increased dyspnea.  She was recently admitted with pneumonia and COVID-19 from 1/9-1/13, she was initially treated with aztreonam, azithromycin and vanc and discharged on omnicef and was on room air .   She returned to the ED on 1/14 with worsening dyspnea and CXR showed opacification of the L hemithorax suggestive of mucous plugging.  She required 2-3L oxygen and labs were significant for WBC of 20k, Hgb 7.3, lactic acid 2.4, procalcitonin 0.61.   She was admitted and started on Vancomycin and Cefepime along with hypertonic saline nebs, IS and chest PT.  A repeat CXR am 1/15 was unchanged, so PCCM consulted. S/p FOB on 1/17 with removal of thick secretions, failed extubation post procedure. Remained intubated in ICU. Concern for possible GIB 1/18 with significant Hgb drop, black stool.    Pertinent  Medical History   has a past medical history of Hyperlipidemia, Hypertension, and Prolonged QT interval (06/21/2022).   Significant Hospital Events: Including procedures, antibiotic start and stop dates in addition to other pertinent events   1/15 presented with L hemithorax opacification and dyspnea, PCCM consult 1/16 more somnolent with Hgb drop,CXR without improvement in L-sided opacification, transfused 2 units PRBC for hemoglobin 5.5 --> 10 1/17 bronchoscopy with removal of thick mucous plugs from left mainstem and segmental bronchi, failed extubation postprocedure and transferred to ICU 1/18 Concern for GIB, Hgb drop 5.5, GI consulted. Remains on vent 1/20 antibiotics broadened to meropenem due to fevers and leukocytosis 1/21 Remains on vent, off pressors / precedex, more responsive 1/22  extubated  Interim History / Subjective:  Looks great! Feels a little sore. Talking  Objective   Blood pressure (!) 116/41, pulse 93, temperature 97.7 F (36.5 C), resp. rate (!) 21, height 5\' 5"  (1.651 m), weight 83.9 kg, SpO2 100 %.    Vent Mode: PSV;CPAP FiO2 (%):  [30 %] 30 % PEEP:  [5 cmH20] 5 cmH20 Pressure Support:  [8 cmH20] 8 cmH20   Intake/Output Summary (Last 24 hours) at 08/27/2022 4562 Last data filed at 08/27/2022 0600 Gross per 24 hour  Intake 1558.6 ml  Output 2235 ml  Net -676.4 ml    Filed Weights   08/21/22 1545 08/26/22 0555 08/27/22 0409  Weight: 77.9 kg 84.4 kg 83.9 kg   Exam:  Globally weak ++anasarca Aox3 Moves ext to command  WBC improved K low being repleted Cr improved  Resolved Hospital Problem list     Assessment & Plan:   Acute hypoxic respiratory failure, requiring mechanical ventilation post bronchoscopy L hemithorax opacification/atelectasis due to mucous plugging.  Bronch neg, fever/WBC curve improved with addition of meropenem.  Long course of vanc completed.  Extubated 1/22 - Meropenem x 7 days - Progressive mobility, diuresis, up to chair, wean O2 for sats > 90%  COVID + -airborne precautions until ?  Acute Metabolic Encephalopathy Resolved, usual delirium precautions, encourage day/night cycles etc  R/o UGI Bleed Acute blood loss anemia Resolved/stable  HTN - PTA cozaar, coreg when PO access established  Hypokalemia  Hypophosphatemia Fluid overload - Replace K, afternoon lasix  Muscular deconditioning PT/OT, likely will need to return to SNF  Malnutrition (POA) Dysphagia - SLP - Cortrak and TF to supplement as  not taking enough PO  Best Practice (right click and "Reselect all SmartList Selections" daily)  Diet/type: tubefeeds DVT prophylaxis: LMWH GI prophylaxis: PPI Lines: N/A Foley:  Yes, and it is still needed Code Status:  DNR Last date of multidisciplinary goals of care discussion: 1/22:  DNR/DNI  Stable for transfer to stepdown x 1 day, if improves after that would send to progressive  Appreciate [facility] taking over care starting 08/28/21: remaining issues are nutrition plan, finishing IV abx, O2 wean, PT/OT evaluations   Updated son over phone  Erskine Emery MD [facility] 08/27/2022, 8:22 AM   Please see [email or web address] for pager details.   From 7A-7P if no response, please call [phone] After hours, please call ELink [phone]

## 2022-08-27 NOTE — Evaluation (Signed)
Clinical/Bedside Swallow Evaluation Patient Details  Name: Stephanie Benton MRN: [id number] Date of Birth: 1950/11/08  Today's Date: 08/27/2022 Time: SLP Start Time (ACUTE ONLY): 0902 SLP Stop Time (ACUTE ONLY): 0912 SLP Time Calculation (min) (ACUTE ONLY): 10 min  Past Medical History:  Past Medical History:  Diagnosis Date   Hyperlipidemia    Hypertension    Prolonged QT interval 06/21/2022   Past Surgical History:  Past Surgical History:  Procedure Laterality Date   ABDOMINAL HYSTERECTOMY     APPENDECTOMY     BIOPSY  05/17/2022   Procedure: BIOPSY;  Surgeon: Carol Ada, MD;  Location: [facility] ENDOSCOPY;  Service: Gastroenterology;;   BRONCHIAL WASHINGS  08/21/2022   Procedure: BRONCHIAL WASHINGS;  Surgeon: Rigoberto Noel, MD;  Location: [facility] ENDOSCOPY;  Service: Cardiopulmonary;;   CHOLECYSTECTOMY N/A 05/22/2022   Procedure: LAPAROSCOPIC CHOLECYSTECTOMY WITH INTRAOPERATIVE CHOLANGIOGRAM;  Surgeon: Coralie Keens, MD;  Location: [place];  Service: General;  Laterality: N/A;   ESOPHAGOGASTRODUODENOSCOPY N/A 08/23/2022   Procedure: ESOPHAGOGASTRODUODENOSCOPY (EGD);  Surgeon: Carol Ada, MD;  Location: [facility] ENDOSCOPY;  Service: Gastroenterology;  Laterality: N/A;   ESOPHAGOGASTRODUODENOSCOPY (EGD) WITH PROPOFOL N/A 05/17/2022   Procedure: ESOPHAGOGASTRODUODENOSCOPY (EGD) WITH PROPOFOL;  Surgeon: Carol Ada, MD;  Location: [place];  Service: Gastroenterology;  Laterality: N/A;   ESOPHAGOGASTRODUODENOSCOPY (EGD) WITH PROPOFOL N/A 07/12/2022   Procedure: ESOPHAGOGASTRODUODENOSCOPY (EGD) WITH PROPOFOL;  Surgeon: Carol Ada, MD;  Location: [facility] ENDOSCOPY;  Service: Gastroenterology;  Laterality: N/A;   HERNIA REPAIR     TONSILLECTOMY     TUBAL LIGATION     VIDEO BRONCHOSCOPY N/A 08/21/2022   Procedure: VIDEO BRONCHOSCOPY WITHOUT FLUORO;  Surgeon: Rigoberto Noel, MD;  Location: [facility] ENDOSCOPY;  Service: Cardiopulmonary;  Laterality: N/A;   HPI:  72 y/o F presented from SNF with dyspnea  on 1/14. Bronchosopy with removal thick mucous plugs; Intubated 1/17-1/22.  PMH significant for COPD, Bipolar Disorder, HTN, HL, pancreatitis.  She was recently admitted with pneumonia and COVID-19 from 1/9-1/13. Evaluated by SLP on 1/10 with recs for dysphagia 1/thin liquids.    Assessment / Plan / Recommendation  Clinical Impression  Pt presents with functional swallowing s/p five-day intubation.  Normal oral mechanism exam; edentulous.Voice with good quality; able to follow basic commands.  Consumed sips of thin water and ate applesauce with good oral attention, palpable swallow, no s/s of aspiration. Appears to coordinate respiratory/swallow cycles well. Pt declined further POs after only limited intake.  Recommend starting a dysphagia 1 diet with thin liquids; give meds crushed with puree. SLP will follow for potential to advance diet. D/W RN. SLP Visit Diagnosis: Dysphagia, unspecified (R13.10)           Diet Recommendation   Dysphagia 1, thin liquids  Medication Administration: Crushed with puree    Other  Recommendations Oral Care Recommendations: Oral care BID    Recommendations for follow up therapy are one component of a multi-disciplinary discharge planning process, led by the attending physician.  Recommendations may be updated based on patient status, additional functional criteria and insurance authorization.  Follow up Recommendations No SLP follow up              Frequency and Duration min 1 x/week  2 weeks       Prognosis Prognosis for Safe Diet Advancement: Good      Swallow Study   General Date of Onset: 08/18/22 HPI: 72 y/o F presented from SNF with dyspnea on 1/14. Bronchosopy with removal thick mucous plugs; Intubated 1/17-1/22.  PMH significant for  COPD, Bipolar Disorder, HTN, HL, pancreatitis.  She was recently admitted with pneumonia and COVID-19 from 1/9-1/13. Evaluated by SLP on 1/10 with recs for dysphagia 1/thin liquids. Type of Study: Bedside Swallow  Evaluation Previous Swallow Assessment: see HPI Diet Prior to this Study: NPO Temperature Spikes Noted: No Respiratory Status: Nasal cannula History of Recent Intubation: Yes Length of Intubations (days): 5 days Date extubated: 08/26/22 Behavior/Cognition: Alert;Cooperative Oral Cavity Assessment: Within Functional Limits Oral Care Completed by SLP: Recent completion by staff Oral Cavity - Dentition: Edentulous Self-Feeding Abilities: Needs assist Patient Positioning: Upright in bed Baseline Vocal Quality: Normal Volitional Cough: Weak Volitional Swallow: Able to elicit    Oral/Motor/Sensory Function Overall Oral Motor/Sensory Function: Within functional limits   Ice Chips Ice chips: Not tested   Thin Liquid Thin Liquid: Within functional limits    Nectar Thick Nectar Thick Liquid: Not tested   Honey Thick Honey Thick Liquid: Not tested   Puree Puree: Within functional limits   Solid     Solid: Not tested      Stephanie Benton 08/27/2022,9:18 AM  Stephanie Bamberg L. Tivis Ringer, MA CCC/SLP Clinical Specialist - [place] Office number [phone]

## 2022-08-27 NOTE — Evaluation (Signed)
Physical Therapy Evaluation Patient Details Name: Stephanie Benton MRN: [id number] DOB: 1951/03/09 Today's Date: 08/27/2022  History of Present Illness  72 y/o F with PMH significant for COPD, Bipolar Disorder, HTN, HL, pancreatitis who presented from SNF with increased dyspnea.  She was recently admitted with pneumonia and COVID-19 from 1/9-1/13, she was initially treated with aztreonam, azithromycin and vanc and discharged on omnicef and was on room air .   She returned to the ED on 1/14 with worsening dyspnea and CXR showed opacification of the L hemithorax suggestive of mucous plugging, Hgb 7.3, lactic acid 2.4. S/p FOB on 1/17 with removal of thick secretions, failed extubation post procedure. Remained intubated in ICU. Extubated 08/26/22.Concern for possible GIB 1/18 with significant Hgb drop, black stool.  Clinical Impression  Pt admitted with above diagnosis.  Pt currently with functional limitations due to the deficits listed below (see PT Problem List). Pt will benefit from skilled PT to increase their independence and safety with mobility to allow discharge to the venue listed below.   Patient presents with significant weakness, requiring total assistance for mobilizing to siting on bed edge. Patient has been in a SNF, unsure of level of function.  Patient tolerated wel. Limited   movement of UE's, noted edema,  .  HR 109-124 with activity.         Recommendations for follow up therapy are one component of a multi-disciplinary discharge planning process, led by the attending physician.  Recommendations may be updated based on patient status, additional functional criteria and insurance authorization.  Follow Up Recommendations Skilled nursing-short term rehab (<3 hours/day) Can patient physically be transported by private vehicle: No    Assistance Recommended at Discharge Frequent or constant Supervision/Assistance  Patient can return home with the following  Two people to help with  walking and/or transfers;Two people to help with bathing/dressing/bathroom;Assistance with cooking/housework;Direct supervision/assist for medications management;Direct supervision/assist for financial management;Assist for transportation;Help with stairs or ramp for entrance    Equipment Recommendations None recommended by PT  Recommendations for Other Services       Functional Status Assessment Patient has had a recent decline in their functional status and demonstrates the ability to make significant improvements in function in a reasonable and predictable amount of time.     Precautions / Restrictions Precautions Precautions: Fall;Other (comment) Precaution Comments: monitor VS, flexiseal      Mobility  Bed Mobility   Bed Mobility: Rolling, Supine to Sit, Sit to Supine Rolling: Total assist, +2 for physical assistance, +2 for safety/equipment   Supine to sit: Total assist, +2 for physical assistance, +2 for safety/equipment Sit to supine: Total assist, +2 for physical assistance, +2 for safety/equipment   General bed mobility comments: total assistnce for all mobility,    Transfers                        Ambulation/Gait                  Stairs            Wheelchair Mobility    Modified Rankin (Stroke Patients Only)       Balance Overall balance assessment: Needs assistance Sitting-balance support: Feet supported, No upper extremity supported Sitting balance-Leahy Scale: Zero Sitting balance - Comments: knees extended, flexed knees manually  Pertinent Vitals/Pain Pain Assessment Breathing: normal Negative Vocalization: occasional moan/groan, low speech, negative/disapproving quality Facial Expression: smiling or inexpressive Body Language: relaxed Consolability: no need to console PAINAD Score: 1    Home Living Family/patient expects to be discharged to:: Skilled nursing facility                    Additional Comments: SNF, unsure of function, l;ast admit , total care    Prior Function                       Hand Dominance        Extremity/Trunk Assessment   Upper Extremity Assessment Upper Extremity Assessment: LUE deficits/detail;RUE deficits/detail RUE Deficits / Details: limited active finger flexion and lifting  arms , limited active elb flex, edema in  UE LUE Deficits / Details: similar to right    Lower Extremity Assessment Lower Extremity Assessment: LLE deficits/detail;RLE deficits/detail RLE Deficits / Details: limited  knee flex,  manually had to bend the knees when sitting on bed edge LLE Deficits / Details: similar to right    Cervical / Trunk Assessment Cervical / Trunk Assessment: Kyphotic  Communication      Cognition Arousal/Alertness: Awake/alert Behavior During Therapy: Flat affect Overall Cognitive Status: No family/caregiver present to determine baseline cognitive functioning Area of Impairment: Orientation, Attention                 Orientation Level: Place, Time, Situation Current Attention Level: Focused   Following Commands: Follows one step commands inconsistently, Follows one step commands with increased time Safety/Judgement: Decreased awareness of deficits, Decreased awareness of safety Awareness: Intellectual, Anticipatory Problem Solving: Slow processing, Decreased initiation          General Comments      Exercises     Assessment/Plan    PT Assessment Patient needs continued PT services  PT Problem List Decreased strength;Decreased activity tolerance;Decreased range of motion;Decreased balance;Decreased mobility;Decreased coordination;Decreased cognition;Decreased safety awareness;Impaired tone       PT Treatment Interventions Functional mobility training;Therapeutic activities;Therapeutic exercise;Balance training;Neuromuscular re-education;Patient/family education    PT Goals  (Current goals can be found in the Care Plan section)  Acute Rehab PT Goals PT Goal Formulation: Patient unable to participate in goal setting Time For Goal Achievement: 09/10/22 Potential to Achieve Goals: Fair    Frequency Min 2X/week     Co-evaluation               AM-PAC PT "6 Clicks" Mobility  Outcome Measure Help needed turning from your back to your side while in a flat bed without using bedrails?: Total Help needed moving from lying on your back to sitting on the side of a flat bed without using bedrails?: Total Help needed moving to and from a bed to a chair (including a wheelchair)?: Total Help needed standing up from a chair using your arms (e.g., wheelchair or bedside chair)?: Total Help needed to walk in hospital room?: Total Help needed climbing 3-5 steps with a railing? : Total 6 Click Score: 6    End of Session Equipment Utilized During Treatment: Oxygen Activity Tolerance: Patient tolerated treatment well Patient left: in bed;with bed alarm set;with nursing/sitter in room Nurse Communication: Mobility status;Need for lift equipment PT Visit Diagnosis: Muscle weakness (generalized) (M62.81);Other abnormalities of gait and mobility (R26.89);Hemiplegia and hemiparesis    Time: 1450-1515 PT Time Calculation (min) (ACUTE ONLY): 25 min   Charges:   PT Evaluation $PT Eval Low Complexity: 1 Low  PT Treatments $Therapeutic Activity: 8-22 mins        [place] Office [phone] Weekend [phone]   Claretha Cooper 08/27/2022, 3:31 PM

## 2022-08-27 NOTE — Progress Notes (Signed)
Nutrition Follow-up  DOCUMENTATION CODES:   Non-severe (moderate) malnutrition in context of chronic illness  INTERVENTION:  - DYS 1 diet per SLP.  - Boost Breeze po TID, each supplement provides 250 kcal and 9 grams of protein - Prosource Plus BID, each supplement provides 100 kcal and 15g of protein.   - Monitor weight trends.    - Patient noted to have several vitamin deficiencies: vitamin A, D, E, K, and C.              - Patient currently receiving: 50000 units vitamin D weekly, 500mg  vitamin C daily, 400 units vitamin E, and MVI - Patient needs vitamin A and K supplementation.  - Will defer vitamin K supplementation to CCM. - Vitamin A flags for fish allergy.          - Per discussion with pharmacy last week, can try and talk with patient after extubation to obtain details about fish allergy. Unfortunately there is not enough IV vitamin A for a full course to correct deficiency.  - Multivitamin tablet patient is receiving has 5000 units of vitamin A. Patient may need further repletion after discharge via IV if unable to obtain more information about fish allergy   NUTRITION DIAGNOSIS:   Moderate Malnutrition related to chronic illness (chronic pancreatitis, COPD) as evidenced by moderate fat depletion, moderate muscle depletion. *ongoing  GOAL:   Patient will meet greater than or equal to 90% of their needs *unmet  MONITOR:   Vent status, TF tolerance, Weight trends, Labs  REASON FOR ASSESSMENT:   Ventilator    ASSESSMENT:   72 y.o. female with medical history significant of HTN, HLD, bipolar disorder, AKI, chronic abdominal pain, chronic pancreatitis, esophagitis tobacco abuse, COPD, who was just discharged 1/13 to SNF after admission with severe sepsis as a result of CAP and COVID-19 infection but was brought back 1/14 for SOB and hypoxia.  1/14 Admit 1/17 bronchoscopy, failed extubation afterwards 1/21 Adult TF protocol started 1/22 patient extubated 1/23 SLP  eval, advanced to DYS 1 diet  Patient awake and alert at time of visit. She reports a UBW of 111# and denied recent changes in weight. Per EMR, patient has not weighed less than 162# over the past year. She could not say how she was eating during previous admission.   States she is excited to have food and likes nutrition supplements and agreeable to drink during admission. RN reports patient drank one Ensure this morning. Discussed importance of eating well during admission and drinking supplements.   Of note, patient has not been able to receive vitamin A supplementation as there has not been enough IV for deficiency repletion and oral vitamin A flags for fish allergy.   Patient reports she does feel she has a fish allergy but unable to give much more information. Did not feel it was shellfish and states she eats tuna. Shares it may be "flounder and things like that". Unable to fully verify extent of allergy at this time.  She continues to receive other vitamin supplementation for various deficiencies.  Per MD note today, patient may need small bore feeding tube and TF if unable to take adequate PO. Will continue to monitor PO intake and add additional supplements.    Medications reviewed and include: 500mg  vitamin C, 300mg  ferrous sulfate, 1mg  folic acid, MVI, 46962 units vitamin D, 400 units vitamin E, Miralax  Labs reviewed:  K+ 3.2 Phosphorus 2.4   Diet Order:   Diet Order  DIET - DYS 1 Room service appropriate? Yes with Assist; Fluid consistency: Thin  Diet effective now                   EDUCATION NEEDS:  Not appropriate for education at this time  Skin:  Skin Assessment: Skin Integrity Issues: Skin Integrity Issues:: Stage II Stage II: L buttocks  Last BM:  rectal tube  Height:  Ht Readings from Last 1 Encounters:  08/24/22 5\' 5"  (1.651 m)   Weight:  Wt Readings from Last 1 Encounters:  08/27/22 83.9 kg    BMI:  Body mass index is 30.78  kg/m.  Estimated Nutritional Needs:  Kcal:  0932-3557 kcals Protein:  95-110 grams Fluid:  >/= 1.8L    Samson Frederic RD, LDN For contact information, refer to [facility].

## 2022-08-27 NOTE — Progress Notes (Signed)
[facility] ADULT ICU REPLACEMENT PROTOCOL   The patient does apply for the [facility] Adult ICU Electrolyte Replacment Protocol based on the criteria listed below:   1.Exclusion criteria: TCTS, ECMO, Dialysis, and Myasthenia Gravis patients 2. Is GFR >/= 30 ml/min? Yes.    Patient's GFR today is > 60 3. Is SCr </= 2? Yes.   Patient's SCr is 0.78 mg/dL 4. Did SCr increase >/= 0.5 in 24 hours? No. 5.Pt's weight >40kg  Yes.   6. Abnormal electrolyte(s): K+ 3.2  7. Electrolytes replaced per protocol 8.  Call MD STAT for K+ </= 2.5, Phos </= 1, or Mag </= 1 Physician:  Dr Letha Cape, Elfredia Nevins 08/27/2022 4:56 AM

## 2022-08-28 DIAGNOSIS — R6520 Severe sepsis without septic shock: Secondary | ICD-10-CM | POA: Diagnosis not present

## 2022-08-28 DIAGNOSIS — J9601 Acute respiratory failure with hypoxia: Secondary | ICD-10-CM | POA: Diagnosis not present

## 2022-08-28 DIAGNOSIS — A419 Sepsis, unspecified organism: Secondary | ICD-10-CM | POA: Diagnosis not present

## 2022-08-28 LAB — BASIC METABOLIC PANEL
Anion gap: 7 (ref 5–15)
BUN: 19 mg/dL (ref 8–23)
CO2: 20 mmol/L — ABNORMAL LOW (ref 22–32)
Calcium: 8 mg/dL — ABNORMAL LOW (ref 8.9–10.3)
Chloride: 118 mmol/L — ABNORMAL HIGH (ref 98–111)
Creatinine, Ser: 0.69 mg/dL (ref 0.44–1.00)
GFR, Estimated: >60 mL/min (ref >60)
Glucose, Bld: 106 mg/dL — ABNORMAL HIGH (ref 70–99)
Potassium: 3 mmol/L — ABNORMAL LOW (ref 3.5–5.1)
Sodium: 145 mmol/L (ref 135–145)

## 2022-08-28 LAB — GLUCOSE, CAPILLARY
Glucose-Capillary: 77 mg/dL (ref 70–99)
Glucose-Capillary: 91 mg/dL (ref 70–99)
Glucose-Capillary: 89 mg/dL (ref 70–99)
Glucose-Capillary: 75 mg/dL (ref 70–99)
Glucose-Capillary: 94 mg/dL (ref 70–99)
Glucose-Capillary: 87 mg/dL (ref 70–99)
Glucose-Capillary: 85 mg/dL (ref 70–99)

## 2022-08-28 LAB — MAGNESIUM: Magnesium: 1.9 mg/dL (ref 1.7–2.4)

## 2022-08-28 LAB — PHOSPHORUS: Phosphorus: 2 mg/dL — ABNORMAL LOW (ref 2.5–4.6)

## 2022-08-28 MED ORDER — POTASSIUM CHLORIDE CRYS ER 20 MEQ PO TBCR
40.0000 meq | EXTENDED_RELEASE_TABLET | Freq: Two times a day (BID) | ORAL | Status: AC
  Administered 2022-08-28 (×2): 40 meq via ORAL
  Filled 2022-08-28 (×2): qty 2

## 2022-08-28 MED ORDER — ORAL CARE MOUTH RINSE: 15.0000 mL | OROMUCOSAL | Status: DC | PRN

## 2022-08-28 MED ORDER — POTASSIUM CHLORIDE 20 MEQ PO PACK
40.0000 meq | PACK | Freq: Two times a day (BID) | ORAL | Status: DC
  Filled 2022-08-28: qty 2

## 2022-08-28 MED ORDER — MAGNESIUM SULFATE 2 GM/50ML IV SOLN
2.0000 g | Freq: Once | INTRAVENOUS | Status: AC
  Administered 2022-08-28: 2 g via INTRAVENOUS
  Filled 2022-08-28: qty 50

## 2022-08-28 NOTE — Progress Notes (Signed)
[place] RN notified via phone, CBG 72. Continue to offer PO liquids, monitor, and inform on-coming nurse to continue to monitor. Patient requested some coke to drink, 60 mL's given.

## 2022-08-28 NOTE — Progress Notes (Signed)
Speech Language Pathology Treatment: Dysphagia  Patient Details Name: Stephanie Benton MRN: [id number] DOB: 21-Feb-1951 Today's Date: 08/28/2022 Time: 2620-3559 SLP Time Calculation (min) (ACUTE ONLY): 18 min  Assessment / Plan / Recommendation Clinical Impression  Pt seen to assess po tolerance and determine readiness for dietary advancement. Per RN, her intake is very poor - as she is frequently refusing intake. Pt wiling to consume donut hole *2 small bites*, magic shake via straw and water via straw. Excessively prolonged mastication of donut for more than 3 minutes noted with left lateral sulci retention without adequate awareness and liquid swallow x1 did not clear it. SLP used applesauce to transit solid into pharynx. Pt admits her dysphagia is worse now than normal and is observed to have frequent pursed lip breathing. Pt did not attempt to self feed - ? if she has any ability to self feed with her limb - appear contracted. Recommend continue dys1/thin for instutionalized feeding. Left the brownie and donut hole in the room for staff to give her small bites as she desires. Question diet pt consumed at [facility] as not listed in chart - she reports she was consuming soft whole foods. Hopeful for ongoing improvement as pt medically improves. Pt agreeable to plan using teach back.     HPI HPI: 72 y/o F presented from SNF with dyspnea on 1/14. Bronchosopy with removal thick mucous plugs; Intubated 1/17-1/22.  PMH significant for COPD, Bipolar Disorder, HTN, HL, pancreatitis.  She was recently admitted with pneumonia and COVID-19 from 1/9-1/13. Evaluated by SLP on 1/10 with recs for dysphagia 1/thin liquids.  Pt underwent clinical bedside swallow evaluaition after extubation with recommendations for dys1/thin as well.      SLP Plan  Continue with current plan of care      Recommendations for follow up therapy are one component of a multi-disciplinary discharge planning process, led by the attending  physician.  Recommendations may be updated based on patient status, additional functional criteria and insurance authorization.    Recommendations  Diet recommendations: Dysphagia 1 (puree);Thin liquid Liquids provided via: Straw Medication Administration: Crushed with puree Supervision: Full supervision/cueing for compensatory strategies Compensations: Slow rate;Small sips/bites;Other (Comment) (check for pocketing on left) Postural Changes and/or Swallow Maneuvers: Seated upright 90 degrees;Upright 30-60 min after meal                Oral Care Recommendations: Oral care BID Follow Up Recommendations: No SLP follow up SLP Visit Diagnosis: Dysphagia, unspecified (R13.10) Plan: Continue with current plan of care          Kathleen Lime, MS [place]  Macario Golds  08/28/2022, 7:41 PM

## 2022-08-28 NOTE — Progress Notes (Signed)
NAME:  Stephanie Benton, MRN:  [id number], DOB:  13-Mar-1951, LOS: 60 ADMISSION DATE:  08/18/2022, CONSULTATION DATE:  1/15 REFERRING MD:  [place], CHIEF COMPLAINT:  pneumonia  History of Present Illness:  72 y/o F with PMH significant for COPD, Bipolar Disorder, HTN, HL, pancreatitis who presented from SNF with increased dyspnea.  She was recently admitted with pneumonia and COVID-19 from 1/9-1/13, she was initially treated with aztreonam, azithromycin and vanc and discharged on omnicef and was on room air .   She returned to the ED on 1/14 with worsening dyspnea and CXR showed opacification of the L hemithorax suggestive of mucous plugging.  She required 2-3L oxygen and labs were significant for WBC of 20k, Hgb 7.3, lactic acid 2.4, procalcitonin 0.61.   She was admitted and started on Vancomycin and Cefepime along with hypertonic saline nebs, IS and chest PT.  A repeat CXR am 1/15 was unchanged, so PCCM consulted. S/p FOB on 1/17 with removal of thick secretions, failed extubation post procedure. Remained intubated in ICU. Concern for possible GIB 1/18 with significant Hgb drop, black stool.    Pertinent  Medical History   has a past medical history of Hyperlipidemia, Hypertension, and Prolonged QT interval (06/21/2022).   Significant Hospital Events: Including procedures, antibiotic start and stop dates in addition to other pertinent events   1/15 presented with L hemithorax opacification and dyspnea, PCCM consult 1/16 more somnolent with Hgb drop,CXR without improvement in L-sided opacification, transfused 2 units PRBC for hemoglobin 5.5 --> 10 1/17 bronchoscopy with removal of thick mucous plugs from left mainstem and segmental bronchi, failed extubation postprocedure and transferred to ICU 1/18 Concern for GIB, Hgb drop 5.5, GI consulted. Remains on vent 1/20 antibiotics broadened to meropenem due to fevers and leukocytosis 1/21 Remains on vent, off pressors / precedex, more responsive 1/22  extubated 1/24 room air, improving  Interim History / Subjective:  On room air, talkative, wants to get out of bed and walk 4L of urine output yesterday, K remains low   Objective   Blood pressure (!) 130/48, pulse 93, temperature 98.4 F (36.9 C), resp. rate (!) 23, height 5\' 5"  (1.651 m), weight 81.4 kg, SpO2 100 %.        Intake/Output Summary (Last 24 hours) at 08/28/2022 0743 Last data filed at 08/28/2022 0645 Gross per 24 hour  Intake 615.74 ml  Output 4205 ml  Net -3589.26 ml    Filed Weights   08/26/22 0555 08/27/22 0409 08/28/22 0445  Weight: 84.4 kg 83.9 kg 81.4 kg    General:  elderly F, well nourished, no distress HEENT: MM pink/moist Neuro: awake and alert, answering questions appropriately and following commands CV: s1s2 rrr, no m/r/g PULM:  clear bilaterally on RA GI: soft, non-tender Extremities: warm/dry, 1+ edema  Skin: no rashes or lesions    Resolved Hospital Problem list     Assessment & Plan:   Acute hypoxic respiratory failure, requiring mechanical ventilation post bronchoscopy L hemithorax opacification/atelectasis due to mucous plugging.  Bronch neg, fever/WBC curve improved with addition of meropenem.  Long course of vanc completed.  Extubated 1/22 -continues to improve and is on RA - complete course of Meropenem x 7 days - Progressive mobility, mobilize as able  -diuresed well with 40mg  lasix yesterday, 4L UOP and +2.7L  COVID + -airborne precautions now discontinued  Acute Metabolic Encephalopathy Resolved, usual delirium precautions, encourage day/night cycles etc  R/o UGI Bleed Acute blood loss anemia Resolved/stable  HTN - PTA cozaar,  coreg when PO access established  Hypokalemia  Hypophosphatemia Fluid overload -K remains low after Lasix, Mag 1.9, replete both and continue to follow -good UOP, appears fairly euvolemic, hold further lasix   Muscular deconditioning PT/OT, likely will need to return to SNF -seen by PT,  recommend short term rehab  Malnutrition (POA) Dysphagia - SLP eval and is on dysphagia 1 diet, monitor intake and  may need  Cortrak and TF to supplement if not taking enough PO   PCCM will sign off, please re-engage for any acutely worsening issues  Best Practice (right click and "Reselect all SmartList Selections" daily)  Diet/type: dysphagia 1 DVT prophylaxis: LMWH GI prophylaxis: PPI Lines: N/A Foley:  Yes, and it is still needed Code Status:  DNR Last date of multidisciplinary goals of care discussion: 1/22: DNR/DNI   Otilio Carpen Raeden Schippers, PA-C [facility] See Amion for pager If no response to pager , please call [phone] until 7pm After 7:00 pm call Elink  [phone]?[place]

## 2022-08-28 NOTE — Progress Notes (Signed)
[facility] PROGRESS NOTE    Progress Note  Stephanie Benton  NUU:[id number] DOB: 03-24-1951 DOA: 08/18/2022 PCP: Sharmon Revere, MD     Brief Narrative:   Stephanie Benton is an 72 y.o. female past medical history of COPD, bipolar disorder pancreatitis coming from SNF for increased dyspnea she was actually discharged from the hospital on 08/17/2021 during this hospital stay she was treated empirically with antibiotics and discharged on Omnicef on room air.  She returned the next day with chest opacification suggestive of mucous plugging requiring 3 to 5 L with a white count of 20,000 admitted to the ICU started empirically on antibiotics hypertonic saline repeated chest x-ray was unchanged PCCM was consulted status post FOB on 08/21/2022 with removal of thick secretions failed extubation postprocedural concern for GI bleed on 08/22/2022 with dark tarry stools  Significant Events: 1/15 presented with L hemithorax opacification and dyspnea, PCCM consult 1/16 more somnolent with Hgb drop,CXR without improvement in L-sided opacification, transfused 2 units PRBC for hemoglobin 5.5 --> 10 1/17 bronchoscopy with removal of thick mucous plugs from left mainstem and segmental bronchi, failed extubation postprocedure and transferred to ICU 1/18 Concern for GIB, Hgb drop 5.5, GI consulted. Remains on vent 1/20 antibiotics broadened to meropenem due to fevers and leukocytosis 1/21 Remains on vent, off pressors / precedex, more responsive 1/22 extubated    Assessment/Plan:   Acute respiratory failure with hypoxia requiring mechanical ventilation status post bronchoscopy to the left hemithorax due to atelectasis due to mucous plugging: On [place] P- she defervesced leukocytosis improved she completed long course of antibiotics of IV Vanco and meropenem. S/p extubation 08/26/2021 Progressive mobility continue to diurese. Satting greater than 100% on room air weaned to room air.  COVID-19  positive: She has completed her isolation on 08/23/2022.  Acute metabolic encephalopathy: Likely due to delirium continue melatonin at night.  Acute blood loss anemia: Status post 2 units of packed red blood cells due to black tarry stools on 08/22/2018. GI was consulted perform an EGD on 08/23/2022 showed gastritis. Has hemoglobin 8.7.  Hypokalemia: Continues to be low replete orally.  Severe protein malnutrition/dysphagia: Off tube feedings.  Tolerating her diet  Goals of care/ethics: Palliative care to discuss with family goals of care.  Significant muscular deconditioning: PT OT evaluated the patient will need skilled nursing facility.  Sacral decubitus ulcer present on admission RN Pressure Injury Documentation: Pressure Injury 08/22/22 Buttocks Left;Medial;Mid Stage 2 -  Partial thickness loss of dermis presenting as a shallow open injury with a red, pink wound bed without slough. (Active)  08/22/22 0730  Location: Buttocks  Location Orientation: Left;Medial;Mid  Staging: Stage 2 -  Partial thickness loss of dermis presenting as a shallow open injury with a red, pink wound bed without slough.  Wound Description (Comments):   Present on Admission: Yes  Dressing Type Foam - Lift dressing to assess site every shift 08/27/22 1930     Pressure Injury 08/22/22 Buttocks Left;Medial;Mid;Upper Stage 2 -  Partial thickness loss of dermis presenting as a shallow open injury with a red, pink wound bed without slough. (Active)  08/22/22 0730  Location: Buttocks  Location Orientation: Left;Medial;Mid;Upper  Staging: Stage 2 -  Partial thickness loss of dermis presenting as a shallow open injury with a red, pink wound bed without slough.  Wound Description (Comments):   Present on Admission: Yes  Dressing Type Foam - Lift dressing to assess site every shift 08/27/22 1930   DVT prophylaxis: lovenox Family Communication:none Status is:  Inpatient Remains inpatient appropriate because:  Acute respiratory failure with hypoxia    Code Status:     Code Status Orders  (From admission, onward)           Start     Ordered   08/26/22 1532  Do not attempt resuscitation (DNR)  Continuous       Question Answer Comment  If patient has no pulse and is not breathing Do Not Attempt Resuscitation   If patient has a pulse and/or is breathing: Medical Treatment Goals LIMITED ADDITIONAL INTERVENTIONS: Use medication/IV fluids and cardiac monitoring as indicated; Do not use intubation or mechanical ventilation (DNI), also provide comfort medications.  Transfer to Progressive/Stepdown as indicated, avoid Intensive Care.   Consent: Discussion documented in EHR or advanced directives reviewed      08/26/22 1532           Code Status History     Date Active Date Inactive Code Status Order ID Comments User Context   08/18/2022 2146 08/26/2022 1532 DNR [id number]  Elwyn Reach, MD ED   08/13/2022 1932 08/18/2022 1704 DNR [id number]  Toy Baker, MD ED   07/20/2022 1729 07/22/2022 1925 DNR [id number]  Terrilee Croak, MD Inpatient   07/16/2022 0445 07/20/2022 1729 Full Code [id number]  Shela Leff, MD ED   07/08/2022 0840 07/13/2022 1806 Full Code [id number]  Reubin Milan, MD ED   06/21/2022 1753 06/25/2022 1934 Full Code [id number]  Reubin Milan, MD Inpatient   06/21/2022 1555 06/21/2022 1753 Full Code [id number]  Reubin Milan, MD ED   05/17/2022 0147 05/23/2022 1715 Full Code [id number]  Toy Baker, MD ED   02/17/2018 0446 02/18/2018 1413 Full Code [id number]  Vianne Bulls, MD Inpatient      Advance Directive Documentation    Flowsheet Row Most Recent Value  Type of Advance Directive Healthcare Power of [place], Living will  Pre-existing out of facility DNR order (yellow form or pink MOST form) --  "MOST" Form in Place? --         IV Access:   Peripheral IV   Procedures and diagnostic studies:   DG CHEST PORT 1 VIEW  Result  Date: 08/27/2022 CLINICAL DATA:  Provided history: Acute respiratory failure with hypoxia. EXAM: PORTABLE CHEST 1 VIEW COMPARISON:  Prior chest radiographs 08/25/2022 and earlier. FINDINGS: Previously demonstrated ET and enteric tubes no longer appreciated. Left-sided PICC with tip at the level of the lower SVC. The cardiomediastinal silhouette is unchanged. Aortic atherosclerosis. The patient's chin obscures significant portions of the right lung apex. Left pleural effusion with underlying left basilar atelectasis and/or consolidation, similar to the prior chest radiograph of 08/25/2022. Background prominence of the interstitial lung markings bilaterally, suspicious for interstitial edema. No evidence of pneumothorax. IMPRESSION: 1. Please note, the patient's chin obscures significant portions of the right lung apex. 2. Left pleural effusion with underlying left basilar atelectasis and/or consolidation, similar to the prior chest radiograph of 08/25/2022. 3. New from the prior exam, there is prominence of the interstitial lung markings, suspicious for interstitial edema. 4. Aortic Atherosclerosis (ICD10-I70.0). Electronically Signed   By: Kellie Simmering D.O.   On: 08/27/2022 08:20   [name] EKG SITE RITE  Result Date: 08/26/2022 If Site Rite image not attached, placement could not be confirmed due to current cardiac rhythm.    Medical Consultants:   None.   Subjective:    Lonie Peak no complaint is tolerating her diet.  Objective:    Vitals:   08/28/22  0400 08/28/22 0445 08/28/22 0500 08/28/22 0600  BP: (!) 145/53  (!) 129/51 (!) 130/48  Pulse: 92  87 84  Resp: (!) 23  (!) 22 (!) 21  Temp: 99 F (37.2 C)  98.8 F (37.1 C) 98.4 F (36.9 C)  TempSrc: Rectal     SpO2: 99%  100% 100%  Weight:  81.4 kg    Height:       SpO2: 100 % O2 Flow Rate (L/min): 2 L/min FiO2 (%): 30 %   Intake/Output Summary (Last 24 hours) at 08/28/2022 0656 Last data filed at 08/28/2022 0645 Gross per 24 hour   Intake 615.74 ml  Output 4205 ml  Net -3589.26 ml   Filed Weights   08/26/22 0555 08/27/22 0409 08/28/22 0445  Weight: 84.4 kg 83.9 kg 81.4 kg    Exam: General exam: In no acute distress. Respiratory system: Good air movement and clear to auscultation. Cardiovascular system: S1 & S2 heard, RRR. No JVD. Gastrointestinal system: Abdomen is nondistended, soft and nontender.  Extremities: No pedal edema. Skin: Sacral decubitus ulcer stage II   Data Reviewed:    Labs: Basic Metabolic Panel: Recent Labs  Lab 08/24/22 0302 08/25/22 0914 08/25/22 1823 08/26/22 1437 08/27/22 0341 08/28/22 0441  NA 143 143  --  144 144 145  K 3.1* 3.2*  --  3.9 3.2* 3.0*  CL 118* 118*  --  118* 119* 118*  CO2 19* 16*  --  20* 20* 20*  GLUCOSE 74 79  --  129* 123* 106*  BUN 15 16  --  27* 24* 19  CREATININE 0.67 0.78  --  0.93 0.78 0.69  CALCIUM 8.0* 7.8*  --  7.7* 8.2* 8.0*  MG  --  2.0 1.9 2.1 2.0 1.9  PHOS  --  2.2* 2.3* 2.2* 2.4* 2.0*   GFR Estimated Creatinine Clearance: 67 mL/min (by C-G formula based on SCr of 0.69 mg/dL). Liver Function Tests: No results for input(s): "AST", "ALT", "ALKPHOS", "BILITOT", "PROT", "ALBUMIN" in the last 168 hours. No results for input(s): "LIPASE", "AMYLASE" in the last 168 hours. No results for input(s): "AMMONIA" in the last 168 hours. Coagulation profile No results for input(s): "INR", "PROTIME" in the last 168 hours. COVID-19 Labs  No results for input(s): "DDIMER", "FERRITIN", "LDH", "CRP" in the last 72 hours.  Lab Results  Component Value Date   SARSCOV2NAA POSITIVE (A) 08/13/2022   [place] NEGATIVE 07/15/2022   [place] NEGATIVE 05/17/2022   [place] NEGATIVE 10/11/2019    CBC: Recent Labs  Lab 08/23/22 0512 08/24/22 0302 08/25/22 0914 08/26/22 1437 08/27/22 0341  WBC 9.1 17.4* 27.3* 21.5* 17.9*  NEUTROABS 7.4  --   --   --   --   HGB 10.3* 11.1* 10.4* 8.5* 8.7*  HCT 31.8* 35.1* 32.9* 27.3* 27.9*  MCV 89.6 91.4  91.9 92.5 93.0  PLT PLATELET CLUMPS NOTED ON SMEAR, UNABLE TO ESTIMATE 171 93* 109* 128*   Cardiac Enzymes: No results for input(s): "CKTOTAL", "CKMB", "CKMBINDEX", "TROPONINI" in the last 168 hours. BNP (last 3 results) No results for input(s): "PROBNP" in the last 8760 hours. CBG: Recent Labs  Lab 08/27/22 1604 08/27/22 1947 08/27/22 2321 08/28/22 0401 08/28/22 0645  GLUCAP 83 98 101* 77 91   D-Dimer: No results for input(s): "DDIMER" in the last 72 hours. Hgb A1c: No results for input(s): "HGBA1C" in the last 72 hours. Lipid Profile: Recent Labs    08/25/22 0914  TRIG 58   Thyroid function studies: No results for  input(s): "TSH", "T4TOTAL", "T3FREE", "THYROIDAB" in the last 72 hours.  Invalid input(s): "FREET3" Anemia work up: No results for input(s): "VITAMINB12", "FOLATE", "FERRITIN", "TIBC", "IRON", "RETICCTPCT" in the last 72 hours. Sepsis Labs: Recent Labs  Lab 08/24/22 0302 08/25/22 0914 08/26/22 1437 08/27/22 0341  WBC 17.4* 27.3* 21.5* 17.9*   Microbiology Recent Results (from the past 240 hour(s))  Culture, blood (Routine x 2)     Status: None   Collection Time: 08/18/22  8:25 PM   Specimen: BLOOD  Result Value Ref Range Status   Specimen Description   Final    BLOOD BLOOD RIGHT ARM Performed at [facility], [address], [place], [place] [address]    Special Requests   Final    BOTTLES DRAWN AEROBIC AND ANAEROBIC Blood Culture adequate volume Performed at [facility], [address], [place], [place] [address]    Culture   Final    NO GROWTH 5 DAYS Performed at [facility], [address], [place], [place] [address]    Report Status 08/24/2022 FINAL  Final  Respiratory (~20 pathogens) panel by PCR     Status: None   Collection Time: 08/19/22  4:02 PM   Specimen: Nasopharyngeal Swab; Respiratory  Result Value Ref Range Status   Adenovirus NOT DETECTED NOT DETECTED Final   Coronavirus 229E  NOT DETECTED NOT DETECTED Final    Comment: (NOTE) The Coronavirus on the Respiratory Panel, DOES NOT test for the novel  Coronavirus (2019 nCoV)    Coronavirus HKU1 NOT DETECTED NOT DETECTED Final   Coronavirus NL63 NOT DETECTED NOT DETECTED Final   Coronavirus OC43 NOT DETECTED NOT DETECTED Final   Metapneumovirus NOT DETECTED NOT DETECTED Final   Rhinovirus / Enterovirus NOT DETECTED NOT DETECTED Final   Influenza A NOT DETECTED NOT DETECTED Final   Influenza B NOT DETECTED NOT DETECTED Final   Parainfluenza Virus 1 NOT DETECTED NOT DETECTED Final   Parainfluenza Virus 2 NOT DETECTED NOT DETECTED Final   Parainfluenza Virus 3 NOT DETECTED NOT DETECTED Final   Parainfluenza Virus 4 NOT DETECTED NOT DETECTED Final   Respiratory Syncytial Virus NOT DETECTED NOT DETECTED Final   Bordetella pertussis NOT DETECTED NOT DETECTED Final   Bordetella Parapertussis NOT DETECTED NOT DETECTED Final   Chlamydophila pneumoniae NOT DETECTED NOT DETECTED Final   Mycoplasma pneumoniae NOT DETECTED NOT DETECTED Final    Comment: Performed at [facility], [address], [place], [place] [address]  MRSA Next Gen by PCR, Nasal     Status: Abnormal   Collection Time: 08/19/22  4:46 PM   Specimen: Nasal Swab  Result Value Ref Range Status   MRSA by PCR Next Gen DETECTED (A) NOT DETECTED Final    Comment: RESULT CALLED TO, READ BACK BY AND VERIFIED WITH: RN CAESAR AT 2104 08/19/22 CRUICKSHANK A (NOTE) The GeneXpert MRSA Assay (FDA approved for NASAL specimens only), is one component of a comprehensive MRSA colonization surveillance program. It is not intended to diagnose MRSA infection nor to guide or monitor treatment for MRSA infections. Test performance is not FDA approved in patients less than 32 years old. Performed at [facility], [address] [place], [place] [address]   Fungus Culture With Stain     Status: None (Preliminary result)   Collection Time: 08/21/22   1:40 PM   Specimen: Bronchial Alveolar Lavage; Respiratory  Result Value Ref Range Status   Fungus Stain Final report  Final  Comment: (NOTE) Performed At: [facility] [address] [place], [place] [id number] Jolene Schimke MD IE:[id number]    Fungus (Mycology) Culture PENDING  Incomplete   Fungal Source BRONCHIAL ALVEOLAR LAVAGE  Final    Comment: Performed at [facility], [address] [place], [place] [address]  Culture, Respiratory w Gram Stain     Status: None   Collection Time: 08/21/22  1:40 PM   Specimen: Bronchial Alveolar Lavage; Respiratory  Result Value Ref Range Status   Specimen Description   Final    BRONCHIAL ALVEOLAR LAVAGE Performed at [facility], [address] [place], [place] [address]    Special Requests   Final    NONE Performed at [facility], [address], [place], [place] [address]    Gram Stain   Final    FEW WBC PRESENT, PREDOMINANTLY PMN NO ORGANISMS SEEN Performed at [facility], [address], [place], [place] [address]    Culture RARE CANDIDA GUILLIERMONDII  Final   Report Status 08/25/2022 FINAL  Final  Acid Fast Smear (AFB)     Status: None   Collection Time: 08/21/22  1:40 PM   Specimen: Bronchial Alveolar Lavage; Respiratory  Result Value Ref Range Status   AFB Specimen Processing Concentration  Final   Acid Fast Smear Negative  Final    Comment: (NOTE) Performed At: [facility] [address] [place], [place] [id number] Jolene Schimke MD CB:[id number]    Source (AFB) BRONCHIAL ALVEOLAR LAVAGE  Final    Comment: Performed at [facility], [address] [place], [place] [address]  Anaerobic culture w Gram Stain     Status: None   Collection Time: 08/21/22  1:40 PM   Specimen: Bronchial Alveolar Lavage; Respiratory  Result Value Ref Range Status   Specimen Description   Final    BRONCHIAL ALVEOLAR LAVAGE Performed at  [facility], [address], [place], [place] [address]    Special Requests   Final    NONE Performed at [facility], [address] [place], [place] [address]    Culture   Final    NO ANAEROBES ISOLATED Performed at [facility], [address], [place], [place] [address]    Report Status 08/26/2022 FINAL  Final  Fungus Culture Result     Status: None   Collection Time: 08/21/22  1:40 PM  Result Value Ref Range Status   Result 1 Comment  Final    Comment: (NOTE) KOH/Calcofluor preparation:  no fungus observed. Performed At: [facility] [address] [place], [place] [id number] Jolene Schimke MD EX:[id number]      Medications:    (feeding supplement) PROSource Plus  30 mL Oral BID BM   ascorbic acid  500 mg Oral Daily   carvedilol  12.5 mg Oral BID WC   Chlorhexidine Gluconate Cloth  6 each Topical Daily   dorzolamide  1 drop Both Eyes BID   DULoxetine  40 mg Oral Daily   feeding supplement  1 Container Oral TID BM   ferrous sulfate  300 mg Oral BID   fesoterodine  4 mg Oral Daily   folic acid  1 mg Oral Daily   ipratropium-albuterol  3 mL Nebulization QID   latanoprost  1 drop Both Eyes QHS   losartan  50 mg Oral Daily   multivitamin with minerals  1 tablet Oral Daily   nicotine  14 mg Transdermal Daily  pantoprazole (PROTONIX) IV  40 mg Intravenous Q12H   polyethylene glycol  17 g Oral Daily   sodium chloride flush  10-40 mL Intracatheter Q12H   Vitamin D (Ergocalciferol)  50,000 Units Oral Q7 days   vitamin E  400 Units Oral Daily   Continuous Infusions:  sodium chloride 10 mL/hr at 08/28/22 0645   sodium chloride Stopped (08/27/22 0957)   dextrose 5% lactated ringers 10 mL/hr at 08/28/22 0645      LOS: 10 days   Marinda Elk  [facility]  08/28/2022, 6:56 AM

## 2022-08-28 NOTE — Progress Notes (Signed)
CBG 91 after drinking coke

## 2022-08-29 DIAGNOSIS — R6520 Severe sepsis without septic shock: Secondary | ICD-10-CM | POA: Diagnosis not present

## 2022-08-29 DIAGNOSIS — A419 Sepsis, unspecified organism: Secondary | ICD-10-CM | POA: Diagnosis not present

## 2022-08-29 LAB — BASIC METABOLIC PANEL
Anion gap: 4 — ABNORMAL LOW (ref 5–15)
BUN: 14 mg/dL (ref 8–23)
CO2: 21 mmol/L — ABNORMAL LOW (ref 22–32)
Calcium: 8 mg/dL — ABNORMAL LOW (ref 8.9–10.3)
Chloride: 120 mmol/L — ABNORMAL HIGH (ref 98–111)
Creatinine, Ser: 0.55 mg/dL (ref 0.44–1.00)
GFR, Estimated: >60 mL/min (ref >60)
Glucose, Bld: 112 mg/dL — ABNORMAL HIGH (ref 70–99)
Potassium: 2.9 mmol/L — ABNORMAL LOW (ref 3.5–5.1)
Sodium: 145 mmol/L (ref 135–145)

## 2022-08-29 LAB — GLUCOSE, CAPILLARY
Glucose-Capillary: 83 mg/dL (ref 70–99)
Glucose-Capillary: 87 mg/dL (ref 70–99)
Glucose-Capillary: 115 mg/dL — ABNORMAL HIGH (ref 70–99)
Glucose-Capillary: 116 mg/dL — ABNORMAL HIGH (ref 70–99)
Glucose-Capillary: 102 mg/dL — ABNORMAL HIGH (ref 70–99)

## 2022-08-29 LAB — PHOSPHORUS: Phosphorus: 2.1 mg/dL — ABNORMAL LOW (ref 2.5–4.6)

## 2022-08-29 LAB — MAGNESIUM: Magnesium: 2.2 mg/dL (ref 1.7–2.4)

## 2022-08-29 MED ORDER — K PHOS MONO-SOD PHOS DI & MONO 155-852-130 MG PO TABS
500.0000 mg | ORAL_TABLET | Freq: Two times a day (BID) | ORAL | Status: AC
  Administered 2022-08-29 (×2): 500 mg via ORAL
  Filled 2022-08-29 (×3): qty 2

## 2022-08-29 MED ORDER — IPRATROPIUM-ALBUTEROL 0.5-2.5 (3) MG/3ML IN SOLN
3.0000 mL | Freq: Three times a day (TID) | RESPIRATORY_TRACT | Status: DC
  Administered 2022-08-29 – 2022-09-02 (×12): 3 mL via RESPIRATORY_TRACT
  Filled 2022-08-29 (×12): qty 3

## 2022-08-29 MED ORDER — PANTOPRAZOLE SODIUM 40 MG PO TBEC
40.0000 mg | DELAYED_RELEASE_TABLET | Freq: Two times a day (BID) | ORAL | Status: DC
  Administered 2022-08-29 – 2022-09-13 (×29): 40 mg via ORAL
  Filled 2022-08-29 (×30): qty 1

## 2022-08-29 MED ORDER — POTASSIUM CHLORIDE 20 MEQ PO PACK
40.0000 meq | PACK | Freq: Once | ORAL | Status: AC
  Administered 2022-08-29: 40 meq via ORAL
  Filled 2022-08-29: qty 2

## 2022-08-29 NOTE — Progress Notes (Signed)
[facility] PROGRESS NOTE    Progress Note  Stephanie Benton  NWG:[id number] DOB: 16-May-1951 DOA: 08/18/2022 PCP: [name], MD     Brief Narrative:   Stephanie Benton is an 72 y.o. female past medical history of COPD, bipolar disorder pancreatitis coming from SNF for increased dyspnea she was actually discharged from the hospital on 08/17/2021 during this hospital stay she was treated empirically with antibiotics and discharged on [place] on room air.  She returned the next day with chest opacification suggestive of mucous plugging requiring 3 to 5 L with a white count of 20,000 admitted to the ICU started empirically on antibiotics hypertonic saline repeated chest x-ray was unchanged PCCM was consulted status post FOB on 08/21/2022 with removal of thick secretions failed extubation postprocedural concern for GI bleed on 08/22/2022 with dark tarry stools  Significant Events: 1/15 presented with L hemithorax opacification and dyspnea, PCCM consult 1/16 more somnolent with Hgb drop,CXR without improvement in L-sided opacification, transfused 2 units PRBC for hemoglobin 5.5 --> 10 1/17 bronchoscopy with removal of thick mucous plugs from left mainstem and segmental bronchi, failed extubation postprocedure and transferred to ICU 1/18 Concern for GIB, Hgb drop 5.5, GI consulted. Remains on vent 1/20 antibiotics broadened to meropenem due to fevers and leukocytosis 1/21 Remains on vent, off pressors / precedex, more responsive 1/22 extubated    Assessment/Plan:   Acute respiratory failure with hypoxia requiring mechanical ventilation status post bronchoscopy to the left hemithorax due to atelectasis due to mucous plugging: She defervesced, leukocytosis improved she completed long course of antibiotics of IV Vanco and meropenem. S/p extubation 08/26/2021 Progressive mobility continue to diurese. She has been weaned to room air transferred to [place].  COVID-19 positive: She has  completed her isolation on 08/23/2022.  Acute metabolic encephalopathy: Likely due to delirium continue melatonin at night.  Acute blood loss anemia: Status post 2 units of packed red blood cells due to black tarry stools on 08/22/2018. GI was consulted perform an EGD on 08/23/2022 showed gastritis. Has hemoglobin 8.7.  Hypokalemia/hypophosphatemia: Both low replete orally recheck tomorrow morning  Severe protein malnutrition/dysphagia: Off tube feedings.  Tolerating her diet  Goals of care/ethics: Palliative care to discuss with family goals of care.  Significant muscular deconditioning: PT OT evaluated the patient will need skilled nursing facility.  Sacral decubitus ulcer present on admission RN Pressure Injury Documentation: Pressure Injury 08/22/22 Buttocks Left;Medial;Mid Stage 2 -  Partial thickness loss of dermis presenting as a shallow open injury with a red, pink wound bed without slough. (Active)  08/22/22 0730  Location: Buttocks  Location Orientation: Left;Medial;Mid  Staging: Stage 2 -  Partial thickness loss of dermis presenting as a shallow open injury with a red, pink wound bed without slough.  Wound Description (Comments):   Present on Admission: Yes  Dressing Type Foam - Lift dressing to assess site every shift 08/29/22 0448     Pressure Injury 08/22/22 Buttocks Left;Medial;Mid;Upper Stage 2 -  Partial thickness loss of dermis presenting as a shallow open injury with a red, pink wound bed without slough. (Active)  08/22/22 0730  Location: Buttocks  Location Orientation: Left;Medial;Mid;Upper  Staging: Stage 2 -  Partial thickness loss of dermis presenting as a shallow open injury with a red, pink wound bed without slough.  Wound Description (Comments):   Present on Admission: Yes  Dressing Type Foam - Lift dressing to assess site every shift 08/29/22 0448   DVT prophylaxis: lovenox Family Communication:none Status is: Inpatient Remains inpatient  appropriate because: Acute respiratory failure with hypoxia    Code Status:     Code Status Orders  (From admission, onward)           Start     Ordered   08/26/22 1532  Do not attempt resuscitation (DNR)  Continuous       Question Answer Comment  If patient has no pulse and is not breathing Do Not Attempt Resuscitation   If patient has a pulse and/or is breathing: Medical Treatment Goals LIMITED ADDITIONAL INTERVENTIONS: Use medication/IV fluids and cardiac monitoring as indicated; Do not use intubation or mechanical ventilation (DNI), also provide comfort medications.  Transfer to Progressive/Stepdown as indicated, avoid Intensive Care.   Consent: Discussion documented in EHR or advanced directives reviewed      08/26/22 1532           Code Status History     Date Active Date Inactive Code Status Order ID Comments User Context   08/18/2022 2146 08/26/2022 1532 DNR [id number]  Rometta Emery, MD ED   08/13/2022 1932 08/18/2022 1704 DNR [id number]  Therisa Doyne, MD ED   07/20/2022 1729 07/22/2022 1925 DNR [id number]  Lorin Glass, MD Inpatient   07/16/2022 0445 07/20/2022 1729 Full Code [id number]  John Giovanni, MD ED   07/08/2022 0840 07/13/2022 1806 Full Code [id number]  Bobette Mo, MD ED   06/21/2022 1753 06/25/2022 1934 Full Code [id number]  Bobette Mo, MD Inpatient   06/21/2022 1555 06/21/2022 1753 Full Code [id number]  Bobette Mo, MD ED   05/17/2022 0147 05/23/2022 1715 Full Code [id number]  Therisa Doyne, MD ED   02/17/2018 0446 02/18/2018 1413 Full Code [id number]  Briscoe Deutscher, MD Inpatient      Advance Directive Documentation    Flowsheet Row Most Recent Value  Type of Advance Directive Healthcare Power of Attorney, Living will  Pre-existing out of facility DNR order (yellow form or pink MOST form) --  "MOST" Form in Place? --         IV Access:   Peripheral IV   Procedures and diagnostic studies:   No results  found.   Medical Consultants:   None.   Subjective:    [name] tolerating her diet feels better.  Objective:    Vitals:   08/29/22 0200 08/29/22 0300 08/29/22 0400 08/29/22 0500  BP:      Pulse: 89 87 94 87  Resp: (!) 22 (!) 23 (!) 22 (!) 22  Temp: 98.4 F (36.9 C) 98.2 F (36.8 C) 98.1 F (36.7 C) 98.1 F (36.7 C)  TempSrc:      SpO2: 98% 97% 99% 96%  Weight:      Height:       SpO2: 96 % O2 Flow Rate (L/min): 2 L/min FiO2 (%): 30 %   Intake/Output Summary (Last 24 hours) at 08/29/2022 0754 Last data filed at 08/29/2022 0500 Gross per 24 hour  Intake 539.54 ml  Output 525 ml  Net 14.54 ml    Filed Weights   08/26/22 0555 08/27/22 0409 08/28/22 0445  Weight: 84.4 kg 83.9 kg 81.4 kg    Exam: General exam: In no acute distress. Respiratory system: Good air movement and clear to auscultation. Cardiovascular system: S1 & S2 heard, RRR. No JVD. Gastrointestinal system: Abdomen is nondistended, soft and nontender.  Extremities: No pedal edema. Skin: Sacral decubitus ulcer stage II   Data Reviewed:    Labs: Basic Metabolic Panel: Recent Labs  Lab 08/25/22 0914 08/25/22  1823 08/26/22 1437 08/27/22 0341 08/28/22 0441 08/29/22 0600  NA 143  --  144 144 145 145  K 3.2*  --  3.9 3.2* 3.0* 2.9*  CL 118*  --  118* 119* 118* 120*  CO2 16*  --  20* 20* 20* 21*  GLUCOSE 79  --  129* 123* 106* 112*  BUN 16  --  27* 24* 19 14  CREATININE 0.78  --  0.93 0.78 0.69 0.55  CALCIUM 7.8*  --  7.7* 8.2* 8.0* 8.0*  MG 2.0 1.9 2.1 2.0 1.9 2.2  PHOS 2.2* 2.3* 2.2* 2.4* 2.0* 2.1*    GFR Estimated Creatinine Clearance: 67 mL/min (by C-G formula based on SCr of 0.55 mg/dL). Liver Function Tests: No results for input(s): "AST", "ALT", "ALKPHOS", "BILITOT", "PROT", "ALBUMIN" in the last 168 hours. No results for input(s): "LIPASE", "AMYLASE" in the last 168 hours. No results for input(s): "AMMONIA" in the last 168 hours. Coagulation profile No results for  input(s): "INR", "PROTIME" in the last 168 hours. COVID-19 Labs  No results for input(s): "DDIMER", "FERRITIN", "LDH", "CRP" in the last 72 hours.  Lab Results  Component Value Date   SARSCOV2NAA POSITIVE (A) 08/13/2022   SARSCOV2NAA NEGATIVE 07/15/2022   SARSCOV2NAA NEGATIVE 05/17/2022   SARSCOV2NAA NEGATIVE 10/11/2019    CBC: Recent Labs  Lab 08/23/22 0512 08/24/22 0302 08/25/22 0914 08/26/22 1437 08/27/22 0341  WBC 9.1 17.4* 27.3* 21.5* 17.9*  NEUTROABS 7.4  --   --   --   --   HGB 10.3* 11.1* 10.4* 8.5* 8.7*  HCT 31.8* 35.1* 32.9* 27.3* 27.9*  MCV 89.6 91.4 91.9 92.5 93.0  PLT PLATELET CLUMPS NOTED ON SMEAR, UNABLE TO ESTIMATE 171 93* 109* 128*    Cardiac Enzymes: No results for input(s): "CKTOTAL", "CKMB", "CKMBINDEX", "TROPONINI" in the last 168 hours. BNP (last 3 results) No results for input(s): "PROBNP" in the last 8760 hours. CBG: Recent Labs  Lab 08/28/22 1147 08/28/22 1535 08/28/22 2026 08/28/22 2321 08/29/22 0449  GLUCAP 75 94 87 85 83    D-Dimer: No results for input(s): "DDIMER" in the last 72 hours. Hgb A1c: No results for input(s): "HGBA1C" in the last 72 hours. Lipid Profile: No results for input(s): "CHOL", "HDL", "LDLCALC", "TRIG", "CHOLHDL", "LDLDIRECT" in the last 72 hours.  Thyroid function studies: No results for input(s): "TSH", "T4TOTAL", "T3FREE", "THYROIDAB" in the last 72 hours.  Invalid input(s): "FREET3" Anemia work up: No results for input(s): "VITAMINB12", "FOLATE", "FERRITIN", "TIBC", "IRON", "RETICCTPCT" in the last 72 hours. Sepsis Labs: Recent Labs  Lab 08/24/22 0302 08/25/22 0914 08/26/22 1437 08/27/22 0341  WBC 17.4* 27.3* 21.5* 17.9*    Microbiology Recent Results (from the past 240 hour(s))  Respiratory (~20 pathogens) panel by PCR     Status: None   Collection Time: 08/19/22  4:02 PM   Specimen: Nasopharyngeal Swab; Respiratory  Result Value Ref Range Status   Adenovirus NOT DETECTED NOT DETECTED Final    Coronavirus 229E NOT DETECTED NOT DETECTED Final    Comment: (NOTE) The Coronavirus on the Respiratory Panel, DOES NOT test for the novel  Coronavirus (2019 nCoV)    Coronavirus HKU1 NOT DETECTED NOT DETECTED Final   Coronavirus NL63 NOT DETECTED NOT DETECTED Final   Coronavirus OC43 NOT DETECTED NOT DETECTED Final   Metapneumovirus NOT DETECTED NOT DETECTED Final   Rhinovirus / Enterovirus NOT DETECTED NOT DETECTED Final   Influenza A NOT DETECTED NOT DETECTED Final   Influenza B NOT DETECTED NOT DETECTED Final  Parainfluenza Virus 1 NOT DETECTED NOT DETECTED Final   Parainfluenza Virus 2 NOT DETECTED NOT DETECTED Final   Parainfluenza Virus 3 NOT DETECTED NOT DETECTED Final   Parainfluenza Virus 4 NOT DETECTED NOT DETECTED Final   Respiratory Syncytial Virus NOT DETECTED NOT DETECTED Final   Bordetella pertussis NOT DETECTED NOT DETECTED Final   Bordetella Parapertussis NOT DETECTED NOT DETECTED Final   Chlamydophila pneumoniae NOT DETECTED NOT DETECTED Final   Mycoplasma pneumoniae NOT DETECTED NOT DETECTED Final    Comment: Performed at [facility], [place] [address] [place], [place] [address]  MRSA Next Gen by PCR, Nasal     Status: Abnormal   Collection Time: 08/19/22  4:46 PM   Specimen: Nasal Swab  Result Value Ref Range Status   MRSA by PCR Next Gen DETECTED (A) NOT DETECTED Final    Comment: RESULT CALLED TO, READ BACK BY AND VERIFIED WITH: RN CAESAR AT 2104 08/19/22 CRUICKSHANK A (NOTE) The GeneXpert MRSA Assay (FDA approved for NASAL specimens only), is one component of a comprehensive MRSA colonization surveillance program. It is not intended to diagnose MRSA infection nor to guide or monitor treatment for MRSA infections. Test performance is not FDA approved in patients less than 39 years old. Performed at [facility], [place] [address], [place], [place] [address]   Fungus Culture With Stain     Status: None (Preliminary result)    Collection Time: 08/21/22  1:40 PM   Specimen: Bronchial Alveolar Lavage; Respiratory  Result Value Ref Range Status   Fungus Stain Final report  Final    Comment: (NOTE) Performed At: [facility] [place], [place] [id number] Rush Farmer MD EV:[id number]    Fungus (Mycology) Culture PENDING  Incomplete   Fungal Source BRONCHIAL ALVEOLAR LAVAGE  Final    Comment: Performed at [facility], [place] [address], [place], [place] [address]  Culture, Respiratory w Gram Stain     Status: None   Collection Time: 08/21/22  1:40 PM   Specimen: Bronchial Alveolar Lavage; Respiratory  Result Value Ref Range Status   Specimen Description   Final    BRONCHIAL ALVEOLAR LAVAGE Performed at [facility] [address] [place], [place] [address]    Special Requests   Final    NONE Performed at [facility], [place] [address] [place], [place] [address]    Gram Stain   Final    FEW WBC PRESENT, PREDOMINANTLY PMN NO ORGANISMS SEEN Performed at [facility], [place] [address], [place], [place] [address]    Culture RARE CANDIDA GUILLIERMONDII  Final   Report Status 08/25/2022 FINAL  Final  Acid Fast Smear (AFB)     Status: None   Collection Time: 08/21/22  1:40 PM   Specimen: Bronchial Alveolar Lavage; Respiratory  Result Value Ref Range Status   AFB Specimen Processing Concentration  Final   Acid Fast Smear Negative  Final    Comment: (NOTE) Performed At: [facility] [place], [place] [id number] Rush Farmer MD EU:[id number]    Source (AFB) BRONCHIAL ALVEOLAR LAVAGE  Final    Comment: Performed at [place] [address] [place], [place] [address]  Anaerobic culture w Gram Stain     Status: None   Collection Time: 08/21/22  1:40 PM   Specimen: Bronchial Alveolar Lavage; Respiratory  Result Value Ref Range Status   Specimen Description   Final    BRONCHIAL  ALVEOLAR LAVAGE Performed at [facility], [address] [place], [place] [address]    Special Requests   Final    NONE Performed at [facility], [address], [place], [place] [address]    Culture   Final    NO ANAEROBES ISOLATED Performed at [facility], [address], [place], [place] [address]    Report Status 08/26/2022 FINAL  Final  Fungus Culture Result     Status: None   Collection Time: 08/21/22  1:40 PM  Result Value Ref Range Status   Result 1 Comment  Final    Comment: (NOTE) KOH/Calcofluor preparation:  no fungus observed. Performed At: [facility] [address] [place], [place] [id number] Jolene Schimke MD GQ:[id number]      Medications:    (feeding supplement) PROSource Plus  30 mL Oral BID BM   ascorbic acid  500 mg Oral Daily   carvedilol  12.5 mg Oral BID WC   Chlorhexidine Gluconate Cloth  6 each Topical Daily   dorzolamide  1 drop Both Eyes BID   DULoxetine  40 mg Oral Daily   feeding supplement  1 Container Oral TID BM   ferrous sulfate  300 mg Oral BID   fesoterodine  4 mg Oral Daily   folic acid  1 mg Oral Daily   ipratropium-albuterol  3 mL Nebulization QID   latanoprost  1 drop Both Eyes QHS   losartan  50 mg Oral Daily   multivitamin with minerals  1 tablet Oral Daily   nicotine  14 mg Transdermal Daily   pantoprazole (PROTONIX) IV  40 mg Intravenous Q12H   polyethylene glycol  17 g Oral Daily   sodium chloride flush  10-40 mL Intracatheter Q12H   Vitamin D (Ergocalciferol)  50,000 Units Oral Q7 days   vitamin E  400 Units Oral Daily   Continuous Infusions:  sodium chloride 10 mL/hr at 08/29/22 0302   sodium chloride Stopped (08/27/22 0957)   dextrose 5% lactated ringers 10 mL/hr at 08/29/22 0302      LOS: 11 days   Marinda Elk  [facility]  08/29/2022, 7:54 AM

## 2022-08-30 DIAGNOSIS — A419 Sepsis, unspecified organism: Secondary | ICD-10-CM | POA: Diagnosis not present

## 2022-08-30 DIAGNOSIS — R6520 Severe sepsis without septic shock: Secondary | ICD-10-CM | POA: Diagnosis not present

## 2022-08-30 LAB — RENAL FUNCTION PANEL
Albumin: 1.7 g/dL — ABNORMAL LOW (ref 3.5–5.0)
Anion gap: 9 (ref 5–15)
BUN: 11 mg/dL (ref 8–23)
CO2: 21 mmol/L — ABNORMAL LOW (ref 22–32)
Calcium: 8 mg/dL — ABNORMAL LOW (ref 8.9–10.3)
Chloride: 117 mmol/L — ABNORMAL HIGH (ref 98–111)
Creatinine, Ser: 0.52 mg/dL (ref 0.44–1.00)
GFR, Estimated: >60 mL/min (ref >60)
Glucose, Bld: 128 mg/dL — ABNORMAL HIGH (ref 70–99)
Phosphorus: 2.8 mg/dL (ref 2.5–4.6)
Potassium: 2.8 mmol/L — ABNORMAL LOW (ref 3.5–5.1)
Sodium: 147 mmol/L — ABNORMAL HIGH (ref 135–145)

## 2022-08-30 LAB — GLUCOSE, CAPILLARY
Glucose-Capillary: 104 mg/dL — ABNORMAL HIGH (ref 70–99)
Glucose-Capillary: 115 mg/dL — ABNORMAL HIGH (ref 70–99)
Glucose-Capillary: 148 mg/dL — ABNORMAL HIGH (ref 70–99)
Glucose-Capillary: 81 mg/dL (ref 70–99)
Glucose-Capillary: 88 mg/dL (ref 70–99)
Glucose-Capillary: 90 mg/dL (ref 70–99)

## 2022-08-30 MED ORDER — POTASSIUM CHLORIDE CRYS ER 20 MEQ PO TBCR
20.0000 meq | EXTENDED_RELEASE_TABLET | Freq: Two times a day (BID) | ORAL | Status: AC
  Administered 2022-08-30 (×2): 20 meq via ORAL
  Filled 2022-08-30 (×2): qty 1

## 2022-08-30 MED ORDER — POTASSIUM CHLORIDE IN NACL 40-0.9 MEQ/L-% IV SOLN
INTRAVENOUS | Status: AC
  Filled 2022-08-30 (×2): qty 1000

## 2022-08-30 NOTE — TOC Initial Note (Signed)
Transition of Care [facility]) - Initial/Assessment Note    Patient Details  Name: Stephanie Benton MRN: [id number] Date of Birth: 23-Oct-1950  Transition of Care [facility]) CM/SW Contact:    Servando Snare, LCSW Phone Number: 08/30/2022, 9:09 AM  Clinical Narrative:     TOC left message for patients son, [name]. Patient from [facility] STR. Plan is for patient to return. Patient will need new insurance auth closer to dc.    Expected Discharge Plan: Skilled Nursing Facility Barriers to Discharge: Continued Medical Work up   Patient Goals and CMS Choice     Choice offered to / list presented to : Adult Children      Expected Discharge Plan and Services     Post Acute Care Choice: [place] arrangements for the past 2 months: [place]                 DME Arranged: N/A DME Agency: NA       HH Arranged: NA [place] Agency: NA        Prior Living Arrangements/Services Living arrangements for the past 2 months: [place] Lives with:: Self Patient language and need for interpreter reviewed:: Yes     SNF  Need for Family Participation in Patient Care: Yes (Comment) Care giver support system in place?: Yes (comment) Current home services: DME Criminal Activity/Legal Involvement Pertinent to Current Situation/Hospitalization: No - Comment as needed  Activities of Daily Living Home Assistive Devices/Equipment: Other (Comment) (from SNF) ADL Screening (condition at time of admission) Patient's cognitive ability adequate to safely complete daily activities?: Yes Is the patient deaf or have difficulty hearing?: No Does the patient have difficulty seeing, even when wearing glasses/contacts?: No Does the patient have difficulty concentrating, remembering, or making decisions?: Yes Patient able to express need for assistance with ADLs?: Yes Does the patient have difficulty dressing or bathing?: Yes Independently performs ADLs?: No Communication:  Independent Dressing (OT): Needs assistance Is this a change from baseline?: Pre-admission baseline Grooming: Needs assistance Is this a change from baseline?: Pre-admission baseline Feeding: Needs assistance Is this a change from baseline?: Pre-admission baseline Bathing: Needs assistance Is this a change from baseline?: Pre-admission baseline Toileting: Needs assistance Is this a change from baseline?: Pre-admission baseline In/Out Bed: Needs assistance Is this a change from baseline?: Pre-admission baseline Walks in Home: Dependent Is this a change from baseline?: Pre-admission baseline Does the patient have difficulty walking or climbing stairs?: Yes Weakness of Legs: Both Weakness of Arms/Hands: Both  Permission Sought/Granted Permission sought to share information with : Facility Sport and exercise psychologist, Family Supports    Share Information with NAME: [name]  Permission granted to share info w AGENCY: [facility]  Permission granted to share info w Relationship: son  Permission granted to share info w Contact Information: [phone]  Emotional Assessment Appearance:: Appears stated age Attitude/Demeanor/Rapport: Unable to Assess Affect (typically observed): Unable to Assess Orientation: : Oriented to Self, Oriented to Place Alcohol / Substance Use: Not Applicable Psych Involvement: No (comment)  Admission diagnosis:  Severe sepsis with acute organ dysfunction ([place]) [A41.9, R65.20] Sepsis with acute organ dysfunction, due to unspecified organism, unspecified organ dysfunction type, unspecified whether septic shock present ([place]) [A41.9, R65.20] Patient Active Problem List   Diagnosis Date Noted   Atelectasis of left lung 08/21/2022   Acute respiratory failure with hypoxia ([place]) 08/21/2022   Severe sepsis with acute organ dysfunction ([place]) 08/18/2022   HCAP (healthcare-associated pneumonia) 08/13/2022   Tobacco abuse 08/13/2022   Malnutrition of  moderate degree 07/17/2022    Severe sepsis ([place]) 07/16/2022   Hypothermia 07/16/2022   Rhabdomyolysis 47/42/5956   Acute metabolic encephalopathy 38/75/6433   Elevated liver enzymes 07/16/2022   Acute on chronic pancreatitis ([place]) 06/23/2022   Hypokalemia 06/21/2022   COPD (chronic obstructive pulmonary disease) ([place]) 06/21/2022   Glaucoma 06/21/2022   Abdominal pain, chronic, epigastric    LFTs abnormal    Pancreatitis 29/51/8841   Metabolic acidosis 66/01/3015   Esophagitis determined by endoscopy 05/17/2022   Dehydration 05/16/2022   Sleep disturbance 08/21/2021   Spondylolisthesis at L5-S1 level 04/25/2021   Primary osteoarthritis of left knee 05/12/2018   Insomnia due to mental condition 05/08/2018   Chronic bilateral low back pain 03/25/2018   Tobacco abuse counseling 02/18/2018   Hyperkalemia 02/18/2018   Chest pain 02/17/2018   COPD with acute exacerbation ([place]) 02/17/2018   Hypertension 02/17/2018   Depression with anxiety 02/17/2018   AKI (acute kidney injury) ([place])    Anemia    Bipolar disorder, in partial remission, most recent episode mixed ([place]) 07/04/2017   Osteopenia of multiple sites 07/04/2017   Bipolar disorder, in full remission, most recent episode mixed ([place]) 07/04/2017   Bipolar disorder, in partial remission, most recent episode depressed ([place]) 07/04/2017   Cigarette smoker 06/12/2017   Bilateral hip pain 05/01/2017   B12 deficiency 03/04/2017   Spondylosis of lumbar region without myelopathy or radiculopathy 08/08/2016   Risk for falls 05/06/2016   Hypercholesterolemia 02/21/2016   PCP:  [name], MD Pharmacy:   [facility] [place], [place] AT [address] [address] Phone: [phone] Fax: [phone]     Social Determinants of Health ([place]) Social History: [place]: No Food Insecurity (08/19/2022)  Housing: Low Risk  (08/19/2022)  Recent  Concern: Housing - Medium Risk (06/21/2022)  Transportation Needs: No Transportation Needs (08/19/2022)  Recent Concern: Transportation Needs - Unmet Transportation Needs (06/21/2022)  Utilities: Not At Risk (08/19/2022)  Tobacco Use: High Risk (08/25/2022)   SDOH Interventions:     Readmission Risk Interventions    07/22/2022   11:58 AM 07/18/2022    8:43 AM 07/10/2022   10:49 AM  Readmission Risk Prevention Plan  Transportation Screening  Complete Complete  Medication Review ([place])  Complete Complete  PCP or Specialist appointment within 3-5 days of discharge  Complete Complete  HRI or Home Care Consult  Complete Complete  SW Recovery Care/Counseling Consult  Complete Complete  Palliative Care Screening  Not Applicable Not Applicable  Skilled Nursing Facility Complete Not Applicable

## 2022-08-30 NOTE — TOC Progression Note (Signed)
Transition of Care [facility]) - Progression Note   Patient Details  Name: Stephanie Benton MRN: [id number] Date of Birth: [birth year]  Transition of Care [facility]) CM/SW [place], LCSW Phone Number: 08/30/2022, 11:37 AM  Clinical Narrative: Insurance authorization started in [facility] portal. Reference ID # is: [id number]. Approval is pending.    Expected Discharge Plan: [place] Barriers to Discharge: Continued Medical Work up  Expected Discharge Plan and [place] Choice: [place] Living arrangements for the past 2 months: [place]            DME Arranged: N/A DME Agency: NA HH Arranged: NA [place] Agency: NA  Social Determinants of Health (SDOH) Interventions [place]: No Food Insecurity (08/19/2022)  Housing: Low Risk  (08/19/2022)  Recent Concern: Housing - Medium Risk (06/21/2022)  Transportation Needs: No Transportation Needs (08/19/2022)  Recent Concern: Transportation Needs - Unmet Transportation Needs (06/21/2022)  Utilities: Not At Risk (08/19/2022)  Tobacco Use: High Risk (08/25/2022)   Readmission Risk Interventions    07/22/2022   11:58 AM 07/18/2022    8:43 AM 07/10/2022   10:49 AM  Readmission Risk Prevention Plan  Transportation Screening  Complete Complete  Medication Review ([place])  Complete Complete  PCP or Specialist appointment within 3-5 days of discharge  Complete Complete  HRI or Home Care Consult  Complete Complete  SW Recovery Care/Counseling Consult  Complete Complete  Palliative Care Screening  Not Applicable Not [place] Complete Not Applicable

## 2022-08-30 NOTE — Care Management Important Message (Signed)
Important Message  Patient Details IM Letter given. Name: WALAA CAREL MRN: [id number] Date of Birth: 02-13-1951   Medicare Important Message Given:        Kerin Salen 08/30/2022, 12:13 PM

## 2022-08-30 NOTE — Progress Notes (Signed)
[facility] PROGRESS NOTE    Progress Note  Stephanie Benton  LKG:[id number] DOB: October 21, 1950 DOA: 08/18/2022 PCP: [name], MD     Brief Narrative:   Stephanie Benton is an 72 y.o. female past medical history of COPD, bipolar disorder pancreatitis coming from SNF for increased dyspnea she was actually discharged from the hospital on 08/17/2021 during this hospital stay she was treated empirically with antibiotics and discharged on [place] on room air.  She returned the next day with chest opacification suggestive of mucous plugging requiring 3 to 5 L with a white count of 20,000 admitted to the ICU started empirically on antibiotics hypertonic saline repeated chest x-ray was unchanged PCCM was consulted status post FOB on 08/21/2022 with removal of thick secretions failed extubation postprocedural concern for GI bleed on 08/22/2022 with dark tarry stools  Significant Events: 1/15 presented with L hemithorax opacification and dyspnea, PCCM consult 1/16 more somnolent with Hgb drop,CXR without improvement in L-sided opacification, transfused 2 units PRBC for hemoglobin 5.5 --> 10 1/17 bronchoscopy with removal of thick mucous plugs from left mainstem and segmental bronchi, failed extubation postprocedure and transferred to ICU 1/18 Concern for GIB, Hgb drop 5.5, GI consulted. Remains on vent 1/20 antibiotics broadened to meropenem due to fevers and leukocytosis 1/21 Remains on vent, off pressors / precedex, more responsive 1/22 extubated    Assessment/Plan:   Acute respiratory failure with hypoxia requiring mechanical ventilation status post bronchoscopy to the left hemithorax due to atelectasis due to mucous plugging: She defervesced, leukocytosis improved she completed long course of antibiotics of IV Vanco and meropenem. S/p extubation 08/26/2021 Has been weaned to room air. Patient is stable to discharge to skilled nursing facility. Son is at bedside, will like long term  rehab.  COVID-19 positive: She has completed her isolation on 08/23/2022.  Acute metabolic encephalopathy: Likely due to delirium continue melatonin at night.  Acute blood loss anemia: Status post 2 units of packed red blood cells due to black tarry stools on 08/22/2018. GI was consulted perform an EGD on 08/23/2022 showed gastritis. Has hemoglobin 8.7.  Hypokalemia/hypophosphatemia: Potassium is low today replete IV and orally recheck tomorrow morning.  Severe protein malnutrition/dysphagia: Off tube feedings.  Tolerating her diet  Goals of care/ethics: Palliative care to discuss with family goals of care.  Significant muscular deconditioning: PT OT evaluated the patient will need skilled nursing facility.  Sacral decubitus ulcer present on admission RN Pressure Injury Documentation: Pressure Injury 08/22/22 Buttocks Left;Medial;Mid Stage 2 -  Partial thickness loss of dermis presenting as a shallow open injury with a red, pink wound bed without slough. (Active)  08/22/22 0730  Location: Buttocks  Location Orientation: Left;Medial;Mid  Staging: Stage 2 -  Partial thickness loss of dermis presenting as a shallow open injury with a red, pink wound bed without slough.  Wound Description (Comments):   Present on Admission: Yes  Dressing Type Foam - Lift dressing to assess site every shift 08/29/22 1950     Pressure Injury 08/22/22 Buttocks Left;Medial;Mid;Upper Stage 2 -  Partial thickness loss of dermis presenting as a shallow open injury with a red, pink wound bed without slough. (Active)  08/22/22 0730  Location: Buttocks  Location Orientation: Left;Medial;Mid;Upper  Staging: Stage 2 -  Partial thickness loss of dermis presenting as a shallow open injury with a red, pink wound bed without slough.  Wound Description (Comments):   Present on Admission: Yes  Dressing Type Foam - Lift dressing to assess site every shift 08/29/22  1950   DVT prophylaxis: lovenox Family  Communication:none Status is: Inpatient Remains inpatient appropriate because: Acute respiratory failure with hypoxia    Code Status:     Code Status Orders  (From admission, onward)           Start     Ordered   08/26/22 1532  Do not attempt resuscitation (DNR)  Continuous       Question Answer Comment  If patient has no pulse and is not breathing Do Not Attempt Resuscitation   If patient has a pulse and/or is breathing: Medical Treatment Goals LIMITED ADDITIONAL INTERVENTIONS: Use medication/IV fluids and cardiac monitoring as indicated; Do not use intubation or mechanical ventilation (DNI), also provide comfort medications.  Transfer to Progressive/Stepdown as indicated, avoid Intensive Care.   Consent: Discussion documented in EHR or advanced directives reviewed      08/26/22 1532           Code Status History     Date Active Date Inactive Code Status Order ID Comments User Context   08/18/2022 2146 08/26/2022 1532 DNR [id number]  Elwyn Reach, MD ED   08/13/2022 1932 08/18/2022 1704 DNR [id number]  Toy Baker, MD ED   07/20/2022 1729 07/22/2022 1925 DNR [id number]  Terrilee Croak, MD Inpatient   07/16/2022 0445 07/20/2022 1729 Full Code [id number]  Shela Leff, MD ED   07/08/2022 0840 07/13/2022 1806 Full Code [id number]  Reubin Milan, MD ED   06/21/2022 1753 06/25/2022 1934 Full Code [id number]  Reubin Milan, MD Inpatient   06/21/2022 1555 06/21/2022 1753 Full Code [id number]  Reubin Milan, MD ED   05/17/2022 0147 05/23/2022 1715 Full Code [id number]  Toy Baker, MD ED   02/17/2018 0446 02/18/2018 1413 Full Code [id number]  Vianne Bulls, MD Inpatient      Advance Directive Documentation    Flowsheet Row Most Recent Value  Type of Advance Directive Healthcare Power of Attorney, Living will  Pre-existing out of facility DNR order (yellow form or pink MOST form) --  "MOST" Form in Place? --         IV Access:    Peripheral IV   Procedures and diagnostic studies:   No results found.   Medical Consultants:   None.   Subjective:    Stephanie Benton no complains  Objective:    Vitals:   08/29/22 1743 08/29/22 2042 08/30/22 0500 08/30/22 0606  BP: 130/68 (!) 143/71  (!) 145/85  Pulse: 100 92  98  Resp: 20 20  18   Temp: (!) 97.5 F (36.4 C) 98.7 F (37.1 C)  98.9 F (37.2 C)  TempSrc: Oral   Oral  SpO2: 100% 98%  98%  Weight:   80.4 kg   Height:       SpO2: 98 % O2 Flow Rate (L/min): (S) 2 L/min (to RA post neb) FiO2 (%): 30 %   Intake/Output Summary (Last 24 hours) at 08/30/2022 1027 Last data filed at 08/30/2022 0954 Gross per 24 hour  Intake 703.55 ml  Output 1100 ml  Net -396.45 ml    Filed Weights   08/27/22 0409 08/28/22 0445 08/30/22 0500  Weight: 83.9 kg 81.4 kg 80.4 kg    Exam: General exam: In no acute distress. Respiratory system: Good air movement and clear to auscultation. Cardiovascular system: S1 & S2 heard, RRR. No JVD. Gastrointestinal system: Abdomen is nondistended, soft and nontender.  Extremities: No pedal edema. Skin: Sacral decubitus ulcer stage II   Data Reviewed:  Labs: Basic Metabolic Panel: Recent Labs  Lab 08/25/22 1823 08/26/22 1437 08/27/22 0341 08/28/22 0441 08/29/22 0600 08/30/22 0413  NA  --  144 144 145 145 147*  K  --  3.9 3.2* 3.0* 2.9* 2.8*  CL  --  118* 119* 118* 120* 117*  CO2  --  20* 20* 20* 21* 21*  GLUCOSE  --  129* 123* 106* 112* 128*  BUN  --  27* 24* 19 14 11   CREATININE  --  0.93 0.78 0.69 0.55 0.52  CALCIUM  --  7.7* 8.2* 8.0* 8.0* 8.0*  MG 1.9 2.1 2.0 1.9 2.2  --   PHOS 2.3* 2.2* 2.4* 2.0* 2.1* 2.8    GFR Estimated Creatinine Clearance: 66.6 mL/min (by C-G formula based on SCr of 0.52 mg/dL). Liver Function Tests: Recent Labs  Lab 08/30/22 0413  ALBUMIN 1.7*   No results for input(s): "LIPASE", "AMYLASE" in the last 168 hours. No results for input(s): "AMMONIA" in the last 168  hours. Coagulation profile No results for input(s): "INR", "PROTIME" in the last 168 hours. COVID-19 Labs  No results for input(s): "DDIMER", "FERRITIN", "LDH", "CRP" in the last 72 hours.  Lab Results  Component Value Date   SARSCOV2NAA POSITIVE (A) 08/13/2022   SARSCOV2NAA NEGATIVE 07/15/2022   SARSCOV2NAA NEGATIVE 05/17/2022   SARSCOV2NAA NEGATIVE 10/11/2019    CBC: Recent Labs  Lab 08/24/22 0302 08/25/22 0914 08/26/22 1437 08/27/22 0341  WBC 17.4* 27.3* 21.5* 17.9*  HGB 11.1* 10.4* 8.5* 8.7*  HCT 35.1* 32.9* 27.3* 27.9*  MCV 91.4 91.9 92.5 93.0  PLT 171 93* 109* 128*    Cardiac Enzymes: No results for input(s): "CKTOTAL", "CKMB", "CKMBINDEX", "TROPONINI" in the last 168 hours. BNP (last 3 results) No results for input(s): "PROBNP" in the last 8760 hours. CBG: Recent Labs  Lab 08/29/22 1147 08/29/22 1631 08/29/22 2039 08/29/22 2352 08/30/22 0740  GLUCAP 115* 116* 102* 148* 104*    D-Dimer: No results for input(s): "DDIMER" in the last 72 hours. Hgb A1c: No results for input(s): "HGBA1C" in the last 72 hours. Lipid Profile: No results for input(s): "CHOL", "HDL", "LDLCALC", "TRIG", "CHOLHDL", "LDLDIRECT" in the last 72 hours.  Thyroid function studies: No results for input(s): "TSH", "T4TOTAL", "T3FREE", "THYROIDAB" in the last 72 hours.  Invalid input(s): "FREET3" Anemia work up: No results for input(s): "VITAMINB12", "FOLATE", "FERRITIN", "TIBC", "IRON", "RETICCTPCT" in the last 72 hours. Sepsis Labs: Recent Labs  Lab 08/24/22 0302 08/25/22 0914 08/26/22 1437 08/27/22 0341  WBC 17.4* 27.3* 21.5* 17.9*    Microbiology Recent Results (from the past 240 hour(s))  Fungus Culture With Stain     Status: None (Preliminary result)   Collection Time: 08/21/22  1:40 PM   Specimen: Bronchial Alveolar Lavage; Respiratory  Result Value Ref Range Status   Fungus Stain Final report  Final    Comment: (NOTE) Performed At: [facility] [address] [place], [place] [id number] MD Jolene Schimke    Fungus (Mycology) Culture PENDING  Incomplete   Fungal Source BRONCHIAL ALVEOLAR LAVAGE  Final    Comment: Performed at [facility], [address] [place], [place]  Culture, Respiratory w Gram Stain     Status: None   Collection Time: 08/21/22  1:40 PM   Specimen: Bronchial Alveolar Lavage; Respiratory  Result Value Ref Range Status   Specimen Description   Final    BRONCHIAL ALVEOLAR LAVAGE Performed at [facility], [address], [place], [place]  Special Requests   Final    NONE Performed at [facility] [facility], [address] [place], [place] [address]    Gram Stain   Final    FEW WBC PRESENT, PREDOMINANTLY PMN NO ORGANISMS SEEN Performed at [facility], [address] [place], [place] [address]    Culture RARE CANDIDA GUILLIERMONDII  Final   Report Status 08/25/2022 FINAL  Final  Acid Fast Smear (AFB)     Status: None   Collection Time: 08/21/22  1:40 PM   Specimen: Bronchial Alveolar Lavage; Respiratory  Result Value Ref Range Status   AFB Specimen Processing Concentration  Final   Acid Fast Smear Negative  Final    Comment: (NOTE) Performed At: [facility] [address] [place], [place] [id number] Jolene Schimke MD HC:[id number]    Source (AFB) BRONCHIAL ALVEOLAR LAVAGE  Final    Comment: Performed at [facility], [address] [place], [place] [address]  Anaerobic culture w Gram Stain     Status: None   Collection Time: 08/21/22  1:40 PM   Specimen: Bronchial Alveolar Lavage; Respiratory  Result Value Ref Range Status   Specimen Description   Final    BRONCHIAL ALVEOLAR LAVAGE Performed at [facility], [address], [place], [place] [address]    Special Requests   Final    NONE Performed at [facility], [address], [place],  [place] [address]    Culture   Final    NO ANAEROBES ISOLATED Performed at [facility], [address], [place], [place] [address]    Report Status 08/26/2022 FINAL  Final  Fungus Culture Result     Status: None   Collection Time: 08/21/22  1:40 PM  Result Value Ref Range Status   Result 1 Comment  Final    Comment: (NOTE) KOH/Calcofluor preparation:  no fungus observed. Performed At: [facility] [address] [place], [place] [id number] Jolene Schimke MD GH:[id number]      Medications:    (feeding supplement) PROSource Plus  30 mL Oral BID BM   ascorbic acid  500 mg Oral Daily   carvedilol  12.5 mg Oral BID WC   Chlorhexidine Gluconate Cloth  6 each Topical Daily   dorzolamide  1 drop Both Eyes BID   DULoxetine  40 mg Oral Daily   feeding supplement  1 Container Oral TID BM   ferrous sulfate  300 mg Oral BID   fesoterodine  4 mg Oral Daily   folic acid  1 mg Oral Daily   ipratropium-albuterol  3 mL Nebulization TID   latanoprost  1 drop Both Eyes QHS   losartan  50 mg Oral Daily   multivitamin with minerals  1 tablet Oral Daily   nicotine  14 mg Transdermal Daily   pantoprazole  40 mg Oral BID   polyethylene glycol  17 g Oral Daily   sodium chloride flush  10-40 mL Intracatheter Q12H   Vitamin D (Ergocalciferol)  50,000 Units Oral Q7 days   vitamin E  400 Units Oral Daily   Continuous Infusions:  sodium chloride Stopped (08/29/22 0907)   sodium chloride Stopped (08/27/22 0957)   dextrose 5% lactated ringers 10 mL/hr at 08/29/22 2238      LOS: 12 days   Marinda Elk  [facility]  08/30/2022, 10:27 AM

## 2022-08-31 DIAGNOSIS — A419 Sepsis, unspecified organism: Secondary | ICD-10-CM | POA: Diagnosis not present

## 2022-08-31 DIAGNOSIS — R6520 Severe sepsis without septic shock: Secondary | ICD-10-CM | POA: Diagnosis not present

## 2022-08-31 LAB — RENAL FUNCTION PANEL
Albumin: 1.8 g/dL — ABNORMAL LOW (ref 3.5–5.0)
Anion gap: 5 (ref 5–15)
BUN: 7 mg/dL — ABNORMAL LOW (ref 8–23)
CO2: 21 mmol/L — ABNORMAL LOW (ref 22–32)
Calcium: 7.7 mg/dL — ABNORMAL LOW (ref 8.9–10.3)
Chloride: 121 mmol/L — ABNORMAL HIGH (ref 98–111)
Creatinine, Ser: 0.54 mg/dL (ref 0.44–1.00)
GFR, Estimated: >60 mL/min (ref >60)
Glucose, Bld: 88 mg/dL (ref 70–99)
Phosphorus: 2.9 mg/dL (ref 2.5–4.6)
Potassium: 3.2 mmol/L — ABNORMAL LOW (ref 3.5–5.1)
Sodium: 147 mmol/L — ABNORMAL HIGH (ref 135–145)

## 2022-08-31 LAB — GLUCOSE, CAPILLARY
Glucose-Capillary: 72 mg/dL (ref 70–99)
Glucose-Capillary: 81 mg/dL (ref 70–99)
Glucose-Capillary: 97 mg/dL (ref 70–99)

## 2022-08-31 NOTE — Plan of Care (Signed)
  Problem: Education: Goal: Knowledge of risk factors and measures for prevention of condition will improve Outcome: Progressing   Problem: Coping: Goal: Psychosocial and spiritual needs will be supported Outcome: Progressing   Problem: Respiratory: Goal: Will maintain a patent airway Outcome: Progressing Goal: Complications related to the disease process, condition or treatment will be avoided or minimized Outcome: Progressing   Problem: Fluid Volume: Goal: Hemodynamic stability will improve Outcome: Progressing   Problem: Clinical Measurements: Goal: Diagnostic test results will improve Outcome: Progressing Goal: Signs and symptoms of infection will decrease Outcome: Progressing   Problem: Respiratory: Goal: Ability to maintain adequate ventilation will improve Outcome: Progressing   Problem: Education: Goal: Knowledge of General Education information will improve Description: Including pain rating scale, medication(s)/side effects and non-pharmacologic comfort measures Outcome: Progressing   Problem: Health Behavior/Discharge Planning: Goal: Ability to manage health-related needs will improve Outcome: Progressing   Problem: Clinical Measurements: Goal: Ability to maintain clinical measurements within normal limits will improve Outcome: Progressing Goal: Will remain free from infection Outcome: Progressing Goal: Diagnostic test results will improve Outcome: Progressing Goal: Respiratory complications will improve Outcome: Progressing Goal: Cardiovascular complication will be avoided Outcome: Progressing   Problem: Activity: Goal: Risk for activity intolerance will decrease Outcome: Progressing   Problem: Nutrition: Goal: Adequate nutrition will be maintained Outcome: Progressing   Problem: Coping: Goal: Level of anxiety will decrease Outcome: Progressing   Problem: Elimination: Goal: Will not experience complications related to bowel motility Outcome:  Progressing Goal: Will not experience complications related to urinary retention Outcome: Progressing   Problem: Pain Managment: Goal: General experience of comfort will improve Outcome: Progressing   Problem: Safety: Goal: Ability to remain free from injury will improve Outcome: Progressing   Problem: Skin Integrity: Goal: Risk for impaired skin integrity will decrease Outcome: Progressing   Problem: Education: Goal: Knowledge of General Education information will improve Description: Including pain rating scale, medication(s)/side effects and non-pharmacologic comfort measures Outcome: Progressing   Problem: Health Behavior/Discharge Planning: Goal: Ability to manage health-related needs will improve Outcome: Progressing   Problem: Clinical Measurements: Goal: Ability to maintain clinical measurements within normal limits will improve Outcome: Progressing Goal: Will remain free from infection Outcome: Progressing Goal: Diagnostic test results will improve Outcome: Progressing Goal: Respiratory complications will improve Outcome: Progressing Goal: Cardiovascular complication will be avoided Outcome: Progressing   Problem: Activity: Goal: Risk for activity intolerance will decrease Outcome: Progressing   Problem: Nutrition: Goal: Adequate nutrition will be maintained Outcome: Progressing   Problem: Coping: Goal: Level of anxiety will decrease Outcome: Progressing   Problem: Elimination: Goal: Will not experience complications related to bowel motility Outcome: Progressing Goal: Will not experience complications related to urinary retention Outcome: Progressing   Problem: Pain Managment: Goal: General experience of comfort will improve Outcome: Progressing   Problem: Safety: Goal: Ability to remain free from injury will improve Outcome: Progressing   Problem: Skin Integrity: Goal: Risk for impaired skin integrity will decrease Outcome: Progressing    Problem: Activity: Goal: Ability to tolerate increased activity will improve Outcome: Progressing   Problem: Respiratory: Goal: Ability to maintain a clear airway and adequate ventilation will improve Outcome: Progressing   Problem: Role Relationship: Goal: Method of communication will improve Outcome: Progressing   Problem: Safety: Goal: Non-violent Restraint(s) Outcome: Progressing

## 2022-08-31 NOTE — Progress Notes (Signed)
[facility] PROGRESS NOTE    Progress Note  Stephanie Benton  LKG:[id number] DOB: October 21, 1950 DOA: 08/18/2022 PCP: [name], MD     Brief Narrative:   Stephanie Benton is an 72 y.o. female past medical history of COPD, bipolar disorder pancreatitis coming from SNF for increased dyspnea she was actually discharged from the hospital on 08/17/2021 during this hospital stay she was treated empirically with antibiotics and discharged on [place] on room air.  She returned the next day with chest opacification suggestive of mucous plugging requiring 3 to 5 L with a white count of 20,000 admitted to the ICU started empirically on antibiotics hypertonic saline repeated chest x-ray was unchanged PCCM was consulted status post FOB on 08/21/2022 with removal of thick secretions failed extubation postprocedural concern for GI bleed on 08/22/2022 with dark tarry stools  Significant Events: 1/15 presented with L hemithorax opacification and dyspnea, PCCM consult 1/16 more somnolent with Hgb drop,CXR without improvement in L-sided opacification, transfused 2 units PRBC for hemoglobin 5.5 --> 10 1/17 bronchoscopy with removal of thick mucous plugs from left mainstem and segmental bronchi, failed extubation postprocedure and transferred to ICU 1/18 Concern for GIB, Hgb drop 5.5, GI consulted. Remains on vent 1/20 antibiotics broadened to meropenem due to fevers and leukocytosis 1/21 Remains on vent, off pressors / precedex, more responsive 1/22 extubated    Assessment/Plan:   Acute respiratory failure with hypoxia requiring mechanical ventilation status post bronchoscopy to the left hemithorax due to atelectasis due to mucous plugging: She defervesced, leukocytosis improved she completed long course of antibiotics of IV Vanco and meropenem. S/p extubation 08/26/2021 Has been weaned to room air. Patient is stable to discharge to skilled nursing facility. Son is at bedside, will like long term  rehab.  COVID-19 positive: She has completed her isolation on 08/23/2022.  Acute metabolic encephalopathy: Likely due to delirium continue melatonin at night.  Acute blood loss anemia: Status post 2 units of packed red blood cells due to black tarry stools on 08/22/2018. GI was consulted perform an EGD on 08/23/2022 showed gastritis. Has hemoglobin 8.7.  Hypokalemia/hypophosphatemia: Potassium is low today replete IV and orally recheck tomorrow morning.  Severe protein malnutrition/dysphagia: Off tube feedings.  Tolerating her diet  Goals of care/ethics: Palliative care to discuss with family goals of care.  Significant muscular deconditioning: PT OT evaluated the patient will need skilled nursing facility.  Sacral decubitus ulcer present on admission RN Pressure Injury Documentation: Pressure Injury 08/22/22 Buttocks Left;Medial;Mid Stage 2 -  Partial thickness loss of dermis presenting as a shallow open injury with a red, pink wound bed without slough. (Active)  08/22/22 0730  Location: Buttocks  Location Orientation: Left;Medial;Mid  Staging: Stage 2 -  Partial thickness loss of dermis presenting as a shallow open injury with a red, pink wound bed without slough.  Wound Description (Comments):   Present on Admission: Yes  Dressing Type Foam - Lift dressing to assess site every shift 08/29/22 1950     Pressure Injury 08/22/22 Buttocks Left;Medial;Mid;Upper Stage 2 -  Partial thickness loss of dermis presenting as a shallow open injury with a red, pink wound bed without slough. (Active)  08/22/22 0730  Location: Buttocks  Location Orientation: Left;Medial;Mid;Upper  Staging: Stage 2 -  Partial thickness loss of dermis presenting as a shallow open injury with a red, pink wound bed without slough.  Wound Description (Comments):   Present on Admission: Yes  Dressing Type Foam - Lift dressing to assess site every shift 08/29/22  1950   DVT prophylaxis: lovenox Family  Communication:none Status is: Inpatient Remains inpatient appropriate because: Acute respiratory failure with hypoxia    Code Status:     Code Status Orders  (From admission, onward)           Start     Ordered   08/26/22 1532  Do not attempt resuscitation (DNR)  Continuous       Question Answer Comment  If patient has no pulse and is not breathing Do Not Attempt Resuscitation   If patient has a pulse and/or is breathing: Medical Treatment Goals LIMITED ADDITIONAL INTERVENTIONS: Use medication/IV fluids and cardiac monitoring as indicated; Do not use intubation or mechanical ventilation (DNI), also provide comfort medications.  Transfer to Progressive/Stepdown as indicated, avoid Intensive Care.   Consent: Discussion documented in EHR or advanced directives reviewed      08/26/22 1532           Code Status History     Date Active Date Inactive Code Status Order ID Comments User Context   08/18/2022 2146 08/26/2022 1532 DNR [id number]  Elwyn Reach, MD ED   08/13/2022 1932 08/18/2022 1704 DNR [id number]  Toy Baker, MD ED   07/20/2022 1729 07/22/2022 1925 DNR [id number]  Terrilee Croak, MD Inpatient   07/16/2022 0445 07/20/2022 1729 Full Code [id number]  Shela Leff, MD ED   07/08/2022 0840 07/13/2022 1806 Full Code [id number]  Reubin Milan, MD ED   06/21/2022 1753 06/25/2022 1934 Full Code [id number]  Reubin Milan, MD Inpatient   06/21/2022 1555 06/21/2022 1753 Full Code [id number]  Reubin Milan, MD ED   05/17/2022 0147 05/23/2022 1715 Full Code [id number]  Toy Baker, MD ED   02/17/2018 0446 02/18/2018 1413 Full Code [id number]  Vianne Bulls, MD Inpatient      Advance Directive Documentation    Flowsheet Row Most Recent Value  Type of Advance Directive Healthcare Power of Attorney, Living will  Pre-existing out of facility DNR order (yellow form or pink MOST form) --  "MOST" Form in Place? --         IV Access:    Peripheral IV   Procedures and diagnostic studies:   No results found.   Medical Consultants:   None.   Subjective:    Stephanie Benton no complains  Objective:    Vitals:   08/30/22 2025 08/31/22 0500 08/31/22 0622 08/31/22 0920  BP: (!) 163/84  (!) 160/81   Pulse: 98  99   Resp: 20  20   Temp: 97.9 F (36.6 C)  (!) 97.4 F (36.3 C)   TempSrc: Oral  Oral   SpO2: 100%  98% 96%  Weight:  84.5 kg    Height:       SpO2: 96 % O2 Flow Rate (L/min): (S) 2 L/min (to RA post neb) FiO2 (%): 30 %   Intake/Output Summary (Last 24 hours) at 08/31/2022 1049 Last data filed at 08/31/2022 0645 Gross per 24 hour  Intake 2070.22 ml  Output 1425 ml  Net 645.22 ml   Filed Weights   08/28/22 0445 08/30/22 0500 08/31/22 0500  Weight: 81.4 kg 80.4 kg 84.5 kg    Exam: General exam: In no acute distress. Respiratory system: Good air movement and clear to auscultation. Cardiovascular system: S1 & S2 heard, RRR. No JVD. Gastrointestinal system: Abdomen is nondistended, soft and nontender.  Extremities: No pedal edema. Skin: Sacral decubitus ulcer stage II   Data Reviewed:    Labs: Basic  Metabolic Panel: Recent Labs  Lab 08/25/22 1823 08/26/22 1437 08/27/22 0341 08/28/22 0441 08/29/22 0600 08/30/22 0413 08/31/22 0159  NA  --  144 144 145 145 147* 147*  K  --  3.9 3.2* 3.0* 2.9* 2.8* 3.2*  CL  --  118* 119* 118* 120* 117* 121*  CO2  --  20* 20* 20* 21* 21* 21*  GLUCOSE  --  129* 123* 106* 112* 128* 88  BUN  --  27* 24* 19 14 11  7*  CREATININE  --  0.93 0.78 0.69 0.55 0.52 0.54  CALCIUM  --  7.7* 8.2* 8.0* 8.0* 8.0* 7.7*  MG 1.9 2.1 2.0 1.9 2.2  --   --   PHOS 2.3* 2.2* 2.4* 2.0* 2.1* 2.8 2.9   GFR Estimated Creatinine Clearance: 68.2 mL/min (by C-G formula based on SCr of 0.54 mg/dL). Liver Function Tests: Recent Labs  Lab 08/30/22 0413 08/31/22 0159  ALBUMIN 1.7* 1.8*   No results for input(s): "LIPASE", "AMYLASE" in the last 168 hours. No results  for input(s): "AMMONIA" in the last 168 hours. Coagulation profile No results for input(s): "INR", "PROTIME" in the last 168 hours. COVID-19 Labs  No results for input(s): "DDIMER", "FERRITIN", "LDH", "CRP" in the last 72 hours.  Lab Results  Component Value Date   SARSCOV2NAA POSITIVE (A) 08/13/2022   SARSCOV2NAA NEGATIVE 07/15/2022   SARSCOV2NAA NEGATIVE 05/17/2022   SARSCOV2NAA NEGATIVE 10/11/2019    CBC: Recent Labs  Lab 08/25/22 0914 08/26/22 1437 08/27/22 0341  WBC 27.3* 21.5* 17.9*  HGB 10.4* 8.5* 8.7*  HCT 32.9* 27.3* 27.9*  MCV 91.9 92.5 93.0  PLT 93* 109* 128*   Cardiac Enzymes: No results for input(s): "CKTOTAL", "CKMB", "CKMBINDEX", "TROPONINI" in the last 168 hours. BNP (last 3 results) No results for input(s): "PROBNP" in the last 8760 hours. CBG: Recent Labs  Lab 08/30/22 1125 08/30/22 1656 08/30/22 2024 08/30/22 2335 08/31/22 0753  GLUCAP 115* 90 81 88 72   D-Dimer: No results for input(s): "DDIMER" in the last 72 hours. Hgb A1c: No results for input(s): "HGBA1C" in the last 72 hours. Lipid Profile: No results for input(s): "CHOL", "HDL", "LDLCALC", "TRIG", "CHOLHDL", "LDLDIRECT" in the last 72 hours.  Thyroid function studies: No results for input(s): "TSH", "T4TOTAL", "T3FREE", "THYROIDAB" in the last 72 hours.  Invalid input(s): "FREET3" Anemia work up: No results for input(s): "VITAMINB12", "FOLATE", "FERRITIN", "TIBC", "IRON", "RETICCTPCT" in the last 72 hours. Sepsis Labs: Recent Labs  Lab 08/25/22 0914 08/26/22 1437 08/27/22 0341  WBC 27.3* 21.5* 17.9*   Microbiology Recent Results (from the past 240 hour(s))  Fungus Culture With Stain     Status: Abnormal   Collection Time: 08/21/22  1:40 PM   Specimen: Bronchial Alveolar Lavage; Respiratory  Result Value Ref Range Status   Fungus Stain Final report  Final   Fungus (Mycology) Culture Preliminary report (A)  Final    Comment: (NOTE) Performed At: [facility] [address] [place], [place] [id number] MD Jolene Schimke    Fungal Source BRONCHIAL ALVEOLAR LAVAGE  Final    Comment: Performed at [facility], [address], [place], [place]  Culture, Respiratory w Gram Stain     Status: None   Collection Time: 08/21/22  1:40 PM   Specimen: Bronchial Alveolar Lavage; Respiratory  Result Value Ref Range Status   Specimen Description   Final    BRONCHIAL ALVEOLAR LAVAGE Performed at [facility], [address], [place], [place]  Special Requests   Final    NONE Performed at [facility], [place] [address], [place], [place] [address]    Gram Stain   Final    FEW WBC PRESENT, PREDOMINANTLY PMN NO ORGANISMS SEEN Performed at [facility], [place] [address], [place], [place] [address]    Culture RARE CANDIDA GUILLIERMONDII  Final   Report Status 08/25/2022 FINAL  Final  Acid Fast Smear (AFB)     Status: None   Collection Time: 08/21/22  1:40 PM   Specimen: Bronchial Alveolar Lavage; Respiratory  Result Value Ref Range Status   AFB Specimen Processing Concentration  Final   Acid Fast Smear Negative  Final    Comment: (NOTE) Performed At: [facility] [place], [place] [id number] Rush Farmer MD KG:[id number]    Source (AFB) BRONCHIAL ALVEOLAR LAVAGE  Final    Comment: Performed at [facility] [address], [place], [place] [address]  Anaerobic culture w Gram Stain     Status: None   Collection Time: 08/21/22  1:40 PM   Specimen: Bronchial Alveolar Lavage; Respiratory  Result Value Ref Range Status   Specimen Description   Final    BRONCHIAL ALVEOLAR LAVAGE Performed at [facility] [address], [place], [place] [address]    Special Requests   Final    NONE Performed at [facility], [place] [address], [place], [place] [address]    Culture   Final    NO  ANAEROBES ISOLATED Performed at [facility], [place] [address], [place], [place] [address]    Report Status 08/26/2022 FINAL  Final  Fungus Culture Result     Status: None   Collection Time: 08/21/22  1:40 PM  Result Value Ref Range Status   Result 1 Comment  Final    Comment: (NOTE) KOH/Calcofluor preparation:  no fungus observed. Performed At: [facility] [place], [place] [id number] Rush Farmer MD YS:[id number]   Fungal organism reflex     Status: Abnormal   Collection Time: 08/21/22  1:40 PM  Result Value Ref Range Status   Fungal result 1 Comment (A)  Final    Comment: (NOTE) Candida guilliermondii Light growth Performed At: [facility] [place], [place] [id number] Rush Farmer MD [id number]      Medications:    (feeding supplement) PROSource Plus  30 mL Oral BID BM   ascorbic acid  500 mg Oral Daily   carvedilol  12.5 mg Oral BID WC   Chlorhexidine Gluconate Cloth  6 each Topical Daily   dorzolamide  1 drop Both Eyes BID   DULoxetine  40 mg Oral Daily   feeding supplement  1 Container Oral TID BM   ferrous sulfate  300 mg Oral BID   fesoterodine  4 mg Oral Daily   folic acid  1 mg Oral Daily   ipratropium-albuterol  3 mL Nebulization TID   latanoprost  1 drop Both Eyes QHS   losartan  50 mg Oral Daily   multivitamin with minerals  1 tablet Oral Daily   nicotine  14 mg Transdermal Daily   pantoprazole  40 mg Oral BID   polyethylene glycol  17 g Oral Daily   sodium chloride flush  10-40 mL Intracatheter Q12H   Vitamin D (Ergocalciferol)  50,000 Units Oral Q7 days   vitamin E  400 Units Oral Daily   Continuous Infusions:  sodium chloride Stopped (08/29/22  0254)   sodium chloride Stopped (08/27/22 0957)   0.9 % NaCl with KCl 40 mEq / L 75 mL/hr at 08/31/22 0528      LOS: 13 days   Marinda Elk  [facility]  08/31/2022, 10:49 AM

## 2022-09-01 DIAGNOSIS — A419 Sepsis, unspecified organism: Secondary | ICD-10-CM | POA: Diagnosis not present

## 2022-09-01 DIAGNOSIS — R6520 Severe sepsis without septic shock: Secondary | ICD-10-CM | POA: Diagnosis not present

## 2022-09-01 LAB — GLUCOSE, CAPILLARY
Glucose-Capillary: 78 mg/dL (ref 70–99)
Glucose-Capillary: 93 mg/dL (ref 70–99)
Glucose-Capillary: 91 mg/dL (ref 70–99)
Glucose-Capillary: 90 mg/dL (ref 70–99)
Glucose-Capillary: 107 mg/dL — ABNORMAL HIGH (ref 70–99)

## 2022-09-01 LAB — RENAL FUNCTION PANEL
Albumin: 1.6 g/dL — ABNORMAL LOW (ref 3.5–5.0)
Anion gap: 8 (ref 5–15)
BUN: 7 mg/dL — ABNORMAL LOW (ref 8–23)
CO2: 21 mmol/L — ABNORMAL LOW (ref 22–32)
Calcium: 8 mg/dL — ABNORMAL LOW (ref 8.9–10.3)
Chloride: 117 mmol/L — ABNORMAL HIGH (ref 98–111)
Creatinine, Ser: 0.55 mg/dL (ref 0.44–1.00)
GFR, Estimated: >60 mL/min (ref >60)
Glucose, Bld: 96 mg/dL (ref 70–99)
Phosphorus: 2.8 mg/dL (ref 2.5–4.6)
Potassium: 3.1 mmol/L — ABNORMAL LOW (ref 3.5–5.1)
Sodium: 146 mmol/L — ABNORMAL HIGH (ref 135–145)

## 2022-09-01 NOTE — Progress Notes (Signed)
[facility] PROGRESS NOTE    Progress Note  Stephanie Benton  UXN:[id number] DOB: Nov 15, 1950 DOA: 08/18/2022 PCP: Sharmon Revere, MD     Brief Narrative:   Stephanie Benton is an 72 y.o. female past medical history of COPD, bipolar disorder pancreatitis coming from SNF for increased dyspnea she was actually discharged from the hospital on 08/17/2021 during this hospital stay she was treated empirically with antibiotics and discharged on [place]mn[place] on room air.  She returned the next day with chest opacification suggestive of mucous plugging requiring 3 to 5 L with a white count of 20,000 admitted to the ICU started empirically on antibiotics hypertonic saline repeated chest x-ray was unchanged PCCM was consulted status post FOB on 08/21/2022 with removal of thick secretions failed extubation postprocedural concern for GI bleed on 08/22/2022 with dark tarry stools  Significant Events: 1/15 presented with L hemithorax opacification and dyspnea, PCCM consult 1/16 more somnolent with Hgb drop,CXR without improvement in L-sided opacification, transfused 2 units PRBC for hemoglobin 5.5 --> 10 1/17 bronchoscopy with removal of thick mucous plugs from left mainstem and segmental bronchi, failed extubation postprocedure and transferred to ICU 1/18 Concern for GIB, Hgb drop 5.5, GI consulted. Remains on vent 1/20 antibiotics broadened to meropenem due to fevers and leukocytosis 1/21 Remains on vent, off pressors / precedex, more responsive 1/22 extubated    Assessment/Plan:   Acute respiratory failure with hypoxia requiring mechanical ventilation status post bronchoscopy to the left hemithorax due to atelectasis due to mucous plugging: She defervesced, leukocytosis improved she completed long course of antibiotics of IV Vanco and meropenem. S/p extubation 08/26/2021 Has been weaned to room air. Patient is stable to discharge to skilled nursing facility.  COVID-19 positive: She has completed her  isolation on 08/23/2022.  Acute metabolic encephalopathy: Likely due to delirium continue melatonin at night.  Acute blood loss anemia: Status post 2 units of packed red blood cells due to black tarry stools on 08/22/2018. GI was consulted perform an EGD on 08/23/2022 showed gastritis. Has hemoglobin 8.7.  Hypokalemia/hypophosphatemia: Potassium is low today replete IV and orally recheck tomorrow morning.  Severe protein malnutrition/dysphagia: Off tube feedings.  Tolerating her diet  Goals of care/ethics: Palliative care to discuss with family goals of care.  Significant muscular deconditioning: PT OT evaluated the patient will need skilled nursing facility.  Sacral decubitus ulcer present on admission RN Pressure Injury Documentation: Pressure Injury 08/22/22 Buttocks Left;Medial;Mid Stage 2 -  Partial thickness loss of dermis presenting as a shallow open injury with a red, pink wound bed without slough. (Active)  08/22/22 0730  Location: Buttocks  Location Orientation: Left;Medial;Mid  Staging: Stage 2 -  Partial thickness loss of dermis presenting as a shallow open injury with a red, pink wound bed without slough.  Wound Description (Comments):   Present on Admission: Yes  Dressing Type Foam - Lift dressing to assess site every shift 09/01/22 0833     Pressure Injury 08/22/22 Buttocks Left;Medial;Mid;Upper Stage 2 -  Partial thickness loss of dermis presenting as a shallow open injury with a red, pink wound bed without slough. (Active)  08/22/22 0730  Location: Buttocks  Location Orientation: Left;Medial;Mid;Upper  Staging: Stage 2 -  Partial thickness loss of dermis presenting as a shallow open injury with a red, pink wound bed without slough.  Wound Description (Comments):   Present on Admission: Yes  Dressing Type Foam - Lift dressing to assess site every shift 08/31/22 1100   DVT prophylaxis: lovenox Family Communication:none Status  is: Inpatient Remains inpatient  appropriate because: Acute respiratory failure with hypoxia    Code Status:     Code Status Orders  (From admission, onward)           Start     Ordered   08/26/22 1532  Do not attempt resuscitation (DNR)  Continuous       Question Answer Comment  If patient has no pulse and is not breathing Do Not Attempt Resuscitation   If patient has a pulse and/or is breathing: Medical Treatment Goals LIMITED ADDITIONAL INTERVENTIONS: Use medication/IV fluids and cardiac monitoring as indicated; Do not use intubation or mechanical ventilation (DNI), also provide comfort medications.  Transfer to Progressive/Stepdown as indicated, avoid Intensive Care.   Consent: Discussion documented in EHR or advanced directives reviewed      08/26/22 1532           Code Status History     Date Active Date Inactive Code Status Order ID Comments User Context   08/18/2022 2146 08/26/2022 1532 DNR [id number]  Elwyn Reach, MD ED   08/13/2022 1932 08/18/2022 1704 DNR [id number]  Toy Baker, MD ED   07/20/2022 1729 07/22/2022 1925 DNR [id number]  Terrilee Croak, MD Inpatient   07/16/2022 0445 07/20/2022 1729 Full Code [id number]  Shela Leff, MD ED   07/08/2022 0840 07/13/2022 1806 Full Code [id number]  Reubin Milan, MD ED   06/21/2022 1753 06/25/2022 1934 Full Code [id number]  Reubin Milan, MD Inpatient   06/21/2022 1555 06/21/2022 1753 Full Code [id number]  Reubin Milan, MD ED   05/17/2022 0147 05/23/2022 1715 Full Code [id number]  Toy Baker, MD ED   02/17/2018 0446 02/18/2018 1413 Full Code [id number]  Vianne Bulls, MD Inpatient      Advance Directive Documentation    Flowsheet Row Most Recent Value  Type of Advance Directive Healthcare Power of Attorney, Living will  Pre-existing out of facility DNR order (yellow form or pink MOST form) --  "MOST" Form in Place? --         IV Access:   Peripheral IV   Procedures and diagnostic studies:   No results  found.   Medical Consultants:   None.   Subjective:    LAELYNN BLIZZARD no complains  Objective:    Vitals:   08/31/22 1947 08/31/22 2145 09/01/22 0558 09/01/22 0848  BP:  (!) 145/89 (!) 151/93   Pulse:  93    Resp:  20 20   Temp:  98.4 F (36.9 C) 98.5 F (36.9 C)   TempSrc:  Axillary Axillary   SpO2: 99% 100% 100% 95%  Weight:      Height:       SpO2: 95 % O2 Flow Rate (L/min): (S) 2 L/min (to RA post neb) FiO2 (%): 30 %   Intake/Output Summary (Last 24 hours) at 09/01/2022 0924 Last data filed at 09/01/2022 0500 Gross per 24 hour  Intake 850.05 ml  Output 550 ml  Net 300.05 ml    Filed Weights   08/28/22 0445 08/30/22 0500 08/31/22 0500  Weight: 81.4 kg 80.4 kg 84.5 kg    Exam: General exam: In no acute distress. Respiratory system: Good air movement and clear to auscultation. Cardiovascular system: S1 & S2 heard, RRR. No JVD. Gastrointestinal system: Abdomen is nondistended, soft and nontender.  Extremities: No pedal edema. Skin: Sacral decubitus ulcer stage II   Data Reviewed:    Labs: Basic Metabolic Panel: Recent Labs  Lab 08/25/22 1823 08/25/22 1823  08/26/22 1437 08/27/22 0341 08/28/22 0441 08/29/22 0600 08/30/22 0413 08/31/22 0159 09/01/22 0255  NA  --    < > 144 144 145 145 147* 147* 146*  K  --    < > 3.9 3.2* 3.0* 2.9* 2.8* 3.2* 3.1*  CL  --    < > 118* 119* 118* 120* 117* 121* 117*  CO2  --    < > 20* 20* 20* 21* 21* 21* 21*  GLUCOSE  --    < > 129* 123* 106* 112* 128* 88 96  BUN  --    < > 27* 24* 19 14 11  7* 7*  CREATININE  --    < > 0.93 0.78 0.69 0.55 0.52 0.54 0.55  CALCIUM  --    < > 7.7* 8.2* 8.0* 8.0* 8.0* 7.7* 8.0*  MG 1.9  --  2.1 2.0 1.9 2.2  --   --   --   PHOS 2.3*  --  2.2* 2.4* 2.0* 2.1* 2.8 2.9 2.8   < > = values in this interval not displayed.    GFR Estimated Creatinine Clearance: 68.2 mL/min (by C-G formula based on SCr of 0.55 mg/dL). Liver Function Tests: Recent Labs  Lab 08/30/22 0413  08/31/22 0159 09/01/22 0255  ALBUMIN 1.7* 1.8* 1.6*    No results for input(s): "LIPASE", "AMYLASE" in the last 168 hours. No results for input(s): "AMMONIA" in the last 168 hours. Coagulation profile No results for input(s): "INR", "PROTIME" in the last 168 hours. COVID-19 Labs  No results for input(s): "DDIMER", "FERRITIN", "LDH", "CRP" in the last 72 hours.  Lab Results  Component Value Date   SARSCOV2NAA POSITIVE (A) 08/13/2022   SARSCOV2NAA NEGATIVE 07/15/2022   [place] NEGATIVE 05/17/2022   [place] NEGATIVE 10/11/2019    CBC: Recent Labs  Lab 08/26/22 1437 08/27/22 0341  WBC 21.5* 17.9*  HGB 8.5* 8.7*  HCT 27.3* 27.9*  MCV 92.5 93.0  PLT 109* 128*    Cardiac Enzymes: No results for input(s): "CKTOTAL", "CKMB", "CKMBINDEX", "TROPONINI" in the last 168 hours. BNP (last 3 results) No results for input(s): "PROBNP" in the last 8760 hours. CBG: Recent Labs  Lab 08/31/22 0753 08/31/22 1142 08/31/22 1707 09/01/22 0321 09/01/22 0803  GLUCAP 72 81 97 78 93    D-Dimer: No results for input(s): "DDIMER" in the last 72 hours. Hgb A1c: No results for input(s): "HGBA1C" in the last 72 hours. Lipid Profile: No results for input(s): "CHOL", "HDL", "LDLCALC", "TRIG", "CHOLHDL", "LDLDIRECT" in the last 72 hours.  Thyroid function studies: No results for input(s): "TSH", "T4TOTAL", "T3FREE", "THYROIDAB" in the last 72 hours.  Invalid input(s): "FREET3" Anemia work up: No results for input(s): "VITAMINB12", "FOLATE", "FERRITIN", "TIBC", "IRON", "RETICCTPCT" in the last 72 hours. Sepsis Labs: Recent Labs  Lab 08/26/22 1437 08/27/22 0341  WBC 21.5* 17.9*    Microbiology No results found for this or any previous visit (from the past 240 hour(s)).    Medications:    (feeding supplement) PROSource Plus  30 mL Oral BID BM   ascorbic acid  500 mg Oral Daily   carvedilol  12.5 mg Oral BID WC   Chlorhexidine Gluconate Cloth  6 each Topical Daily    dorzolamide  1 drop Both Eyes BID   DULoxetine  40 mg Oral Daily   feeding supplement  1 Container Oral TID BM   ferrous sulfate  300 mg Oral BID   fesoterodine  4 mg Oral Daily   folic acid  1 mg  Oral Daily   ipratropium-albuterol  3 mL Nebulization TID   latanoprost  1 drop Both Eyes QHS   losartan  50 mg Oral Daily   multivitamin with minerals  1 tablet Oral Daily   nicotine  14 mg Transdermal Daily   pantoprazole  40 mg Oral BID   polyethylene glycol  17 g Oral Daily   sodium chloride flush  10-40 mL Intracatheter Q12H   Vitamin D (Ergocalciferol)  50,000 Units Oral Q7 days   vitamin E  400 Units Oral Daily   Continuous Infusions:  sodium chloride 1,000 mL (08/31/22 1540)   sodium chloride Stopped (08/27/22 0957)      LOS: 14 days   Marinda Elk  [facility]  09/01/2022, 9:24 AM

## 2022-09-02 ENCOUNTER — Inpatient Hospital Stay (HOSPITAL_COMMUNITY): Payer: 59

## 2022-09-02 DIAGNOSIS — R918 Other nonspecific abnormal finding of lung field: Secondary | ICD-10-CM

## 2022-09-02 DIAGNOSIS — A419 Sepsis, unspecified organism: Secondary | ICD-10-CM | POA: Diagnosis not present

## 2022-09-02 DIAGNOSIS — R6520 Severe sepsis without septic shock: Secondary | ICD-10-CM | POA: Diagnosis not present

## 2022-09-02 DIAGNOSIS — J9601 Acute respiratory failure with hypoxia: Secondary | ICD-10-CM | POA: Diagnosis not present

## 2022-09-02 DIAGNOSIS — T17998A Other foreign object in respiratory tract, part unspecified causing other injury, initial encounter: Secondary | ICD-10-CM

## 2022-09-02 DIAGNOSIS — G9340 Encephalopathy, unspecified: Secondary | ICD-10-CM | POA: Diagnosis not present

## 2022-09-02 DIAGNOSIS — L8990 Pressure ulcer of unspecified site, unspecified stage: Secondary | ICD-10-CM | POA: Insufficient documentation

## 2022-09-02 LAB — BASIC METABOLIC PANEL
Anion gap: 5 (ref 5–15)
BUN: 5 mg/dL — ABNORMAL LOW (ref 8–23)
CO2: 23 mmol/L (ref 22–32)
Calcium: 8 mg/dL — ABNORMAL LOW (ref 8.9–10.3)
Chloride: 116 mmol/L — ABNORMAL HIGH (ref 98–111)
Creatinine, Ser: 0.44 mg/dL (ref 0.44–1.00)
GFR, Estimated: >60 mL/min (ref >60)
Glucose, Bld: 103 mg/dL — ABNORMAL HIGH (ref 70–99)
Potassium: 2.8 mmol/L — ABNORMAL LOW (ref 3.5–5.1)
Sodium: 144 mmol/L (ref 135–145)

## 2022-09-02 LAB — CBC
HCT: 26.6 % — ABNORMAL LOW (ref 36.0–46.0)
Hemoglobin: 8.2 g/dL — ABNORMAL LOW (ref 12.0–15.0)
MCH: 28.4 pg (ref 26.0–34.0)
MCHC: 30.8 g/dL (ref 30.0–36.0)
MCV: 92 fL (ref 80.0–100.0)
Platelets: 282 10*3/uL (ref 150–400)
RBC: 2.89 MIL/uL — ABNORMAL LOW (ref 3.87–5.11)
RDW: 16.2 % — ABNORMAL HIGH (ref 11.5–15.5)
WBC: 6.1 10*3/uL (ref 4.0–10.5)
nRBC: 0 % (ref 0.0–0.2)

## 2022-09-02 LAB — GLUCOSE, CAPILLARY
Glucose-Capillary: 123 mg/dL — ABNORMAL HIGH (ref 70–99)
Glucose-Capillary: 85 mg/dL (ref 70–99)
Glucose-Capillary: 88 mg/dL (ref 70–99)
Glucose-Capillary: 91 mg/dL (ref 70–99)
Glucose-Capillary: 93 mg/dL (ref 70–99)
Glucose-Capillary: 94 mg/dL (ref 70–99)

## 2022-09-02 MED ORDER — SODIUM CHLORIDE 3 % IN NEBU
4.0000 mL | INHALATION_SOLUTION | RESPIRATORY_TRACT | Status: DC
  Filled 2022-09-02 (×2): qty 4

## 2022-09-02 MED ORDER — SODIUM CHLORIDE 3 % IN NEBU
4.0000 mL | INHALATION_SOLUTION | RESPIRATORY_TRACT | Status: AC
  Administered 2022-09-02 – 2022-09-05 (×16): 4 mL via RESPIRATORY_TRACT
  Filled 2022-09-02 (×17): qty 4

## 2022-09-02 MED ORDER — IPRATROPIUM-ALBUTEROL 0.5-2.5 (3) MG/3ML IN SOLN
3.0000 mL | Freq: Two times a day (BID) | RESPIRATORY_TRACT | Status: DC
  Administered 2022-09-02 – 2022-09-05 (×6): 3 mL via RESPIRATORY_TRACT
  Filled 2022-09-02 (×6): qty 3

## 2022-09-02 MED ORDER — POTASSIUM CHLORIDE CRYS ER 20 MEQ PO TBCR
40.0000 meq | EXTENDED_RELEASE_TABLET | Freq: Three times a day (TID) | ORAL | Status: DC
  Administered 2022-09-02: 40 meq via ORAL
  Filled 2022-09-02 (×2): qty 2

## 2022-09-02 NOTE — IPAL (Signed)
  Interdisciplinary Goals of Care Family Meeting   Date carried out:: 09/02/2022  Location of the meeting: Phone conference  Member's involved: Physician and Family Member or next of kin  Durable Power of Attorney or acting medical decision maker: son [name]    Discussion: We discussed goals of care for [facility] .  I called [name] to update him that his mother is back in [place]. He confirmed he would not want her intubated again and she is DNR. He understands that recurrent mucus plugging is a marker of frailty and this is likely to continue to happen if she does not develop a strong enough cough to keep her airways patent. He agrees with aggressive measures to augment her pulmonary clearance, but understands my concerns that she would require intubation to safely bronch her. He agrees that if she develops severe respiratory distress and we are unable to help her clear secretions, we would start medication to keep her comfortable and call family. He lives in [place] and would want to know as soon as possible if this occurs.   Code status: Full DNR, DNI  Disposition: Continue current acute care   Time spent for the meeting: 5 min.  Julian Hy 09/02/2022, 5:47 PM

## 2022-09-02 NOTE — Progress Notes (Signed)
NAME:  Stephanie Benton, MRN:  [id number], DOB:  1950/12/21, LOS: 44 ADMISSION DATE:  08/18/2022, CONSULTATION DATE:  1/15 REFERRING MD:  Pokhrel, CHIEF COMPLAINT:  pneumonia  History of Present Illness:  72 y/o F with PMH significant for COPD, Bipolar Disorder, HTN, HL, pancreatitis who presented from SNF with increased dyspnea.  She was recently admitted with pneumonia and COVID-19 from 1/9-1/13, she was initially treated with aztreonam, azithromycin and vanc and discharged on omnicef and was on room air .   She returned to the ED on 1/14 with worsening dyspnea and CXR showed opacification of the L hemithorax suggestive of mucous plugging.  She required 2-3L oxygen and labs were significant for WBC of 20k, Hgb 7.3, lactic acid 2.4, procalcitonin 0.61.   She was admitted and started on Vancomycin and Cefepime along with hypertonic saline nebs, IS and chest PT.  A repeat CXR am 1/15 was unchanged, so PCCM consulted. S/p FOB on 1/17 with removal of thick secretions, failed extubation post procedure. Remained intubated in ICU. Concern for possible GIB 1/18 with significant Hgb drop, black stool.    Pertinent  Medical History   has a past medical history of Hyperlipidemia, Hypertension, and Prolonged QT interval (06/21/2022).   Significant Hospital Events: Including procedures, antibiotic start and stop dates in addition to other pertinent events   1/15 presented with L hemithorax opacification and dyspnea, PCCM consult 1/16 more somnolent with Hgb drop,CXR without improvement in L-sided opacification, transfused 2 units PRBC for hemoglobin 5.5 --> 10 1/17 bronchoscopy with removal of thick mucous plugs from left mainstem and segmental bronchi, failed extubation postprocedure and transferred to ICU 1/18 Concern for GIB, Hgb drop 5.5, GI consulted. Remains on vent 1/20 antibiotics broadened to meropenem due to fevers and leukocytosis 1/21 Remains on vent, off pressors / precedex, more responsive 1/22  extubated 1/24 room air, improving 1/25 transferred out of ICU 1/29 PCCM reconsult for worsening LLL consolidation  Interim History / Subjective:  PCCM reconsult for worsening LLL consolidation Pt on 2L, says breathing feels fine   Objective   Blood pressure (!) 156/78, pulse 96, temperature 98.3 F (36.8 C), temperature source Oral, resp. rate (!) 25, height 5\' 5"  (1.651 m), weight 80.9 kg, SpO2 100 %.        Intake/Output Summary (Last 24 hours) at 09/02/2022 1323 Last data filed at 09/02/2022 1115 Gross per 24 hour  Intake 358 ml  Output 550 ml  Net -192 ml    Filed Weights   08/30/22 0500 08/31/22 0500 09/02/22 0350  Weight: 80.4 kg 84.5 kg 80.9 kg    General:  elderly F, well nourished, no distress, working with PT HEENT: MM pink/moist, pupils equal  Neuro: awake and alert, answering questions, globally weak, requiring assistance from physical therapy to sit up CV: s1s2 rrr, no m/r/g PULM:  decreased air entry LLL without significant wheezing or rhonchi, no distress on 2L [place] GI: soft, non-tender Extremities: warm/dry, no edema  Skin: no rashes or lesions    Resolved Hospital Problem list   Acute blood loss anemia Acute Metabolic Encephalopathy Hypokalemia  Hypophosphatemia Fluid overload Assessment & Plan:   Worsening LLL consolidation Initial Covid 19 infection in January 2024 Returned with L sided consolidation this admission, L hemithorax opacification/atelectasis due to mucous plugging, failed conservative treatment and eventually required intubation, mechanical ventilation  Bronch neg, completed vanc and meropenem,   Extubated 1/22 -improved and plan was for discharge, back on 2L [place] and worsening CXR -check labs, has  not been spiking fever, likely repeated mucous plugging  -now DNR/DNI, so will try to manage conservatively without bronch as able, start hypertonic saline nebs and chest PT, NTS -continue supplemental O2 to maintain sats >92%    COVID  + -airborne precautions now discontinued   HTN -cont. Cozaar and coreg   Muscular deconditioning PT/OT, likely will need to return to SNF -seen by PT, recommend short term rehab  Malnutrition (POA) Dysphagia -continue dysphagia diet     Best Practice (right click and "Reselect all SmartList Selections" daily)  Diet/type: dysphagia 1 DVT prophylaxis: LMWH GI prophylaxis: PPI Lines: N/A Foley:  Yes, and it is still needed Code Status:  DNR Last date of multidisciplinary goals of care discussion: 1/22: DNR/DNI   Otilio Carpen Stephanie Welden, PA-C [facility] See Amion for pager If no response to pager , please call [phone] until 7pm After 7:00 pm call Elink  [phone]?[place]

## 2022-09-02 NOTE — Progress Notes (Signed)
Physical Therapy Treatment Patient Details Name: Stephanie Benton MRN: [id number] DOB: Jan 09, 1951 Today's Date: 09/02/2022   History of Present Illness Pt is 72 y/o F admitted on 08/18/22 from SNF with dyspnea.  She was recently admitted with pneumonia and COVID-19 from 1/9-1/13, she was initially treated with aztreonam, azithromycin and vanc and discharged on omnicef and was on room air . She returned to the ED on 1/14 with worsening dyspnea and CXR showed opacification of the L hemithorax suggestive of mucous plugging. S/p FOB on 1/17 with removal of thick secretions, failed extubation post procedure. Remained intubated in ICU. Extubated 08/26/22.Concern for possible GIB 1/18 with significant Hgb drop, black stool. Pt with PMH significant for COPD, Bipolar Disorder, HTN, HL, pancreatitis .    PT Comments    Pt with slow progress requiring max A x 2 for transfers to EOB and unable to progress to standing - would need hoyer for OOB.  Session focused on EOB balance and endurance.  Pt with lean to L but some improvement with transition to R elbow then back to midline.  Pt fatigued easily. Continue plan of care. MD and PA into see pt during session.    Recommendations for follow up therapy are one component of a multi-disciplinary discharge planning process, led by the attending physician.  Recommendations may be updated based on patient status, additional functional criteria and insurance authorization.  Follow Up Recommendations  Skilled nursing-short term rehab (<3 hours/day) Can patient physically be transported by private vehicle: No   Assistance Recommended at Discharge Frequent or constant Supervision/Assistance  Patient can return home with the following Two people to help with walking and/or transfers;Two people to help with bathing/dressing/bathroom;Assistance with cooking/housework;Direct supervision/assist for medications management;Direct supervision/assist for financial management;Assist  for transportation;Help with stairs or ramp for entrance   Equipment Recommendations  None recommended by PT (defer to postacute)    Recommendations for Other Services       Precautions / Restrictions Precautions Precautions: Fall     Mobility  Bed Mobility Overal bed mobility: Needs Assistance Bed Mobility: Rolling, Supine to Sit, Sit to Supine Rolling: Max assist, +2 for physical assistance   Supine to sit: Max assist, +2 for physical assistance Sit to supine: Max assist, +2 for physical assistance   General bed mobility comments: Increased time for all with multimodal cues.  REquiring assist for all aspects of transfers - she did assist minimally with legs and reaching    Transfers                   General transfer comment: unable to attempt    Ambulation/Gait                   Stairs             Wheelchair Mobility    Modified Rankin (Stroke Patients Only)       Balance Overall balance assessment: Needs assistance Sitting-balance support: Feet supported, Bilateral upper extremity supported Sitting balance-Leahy Scale: Poor Sitting balance - Comments: Sat EOB for 15 mins.  Pt initially with heavy L lean requiring max A - progressed to min guard with periods of min A.  Progressed with verbal, visual, tactile cues, having pt lean onto R elbow, and trunk rotation to right.       Standing balance comment: unable  Cognition Arousal/Alertness: Lethargic Behavior During Therapy: Flat affect Overall Cognitive Status: No family/caregiver present to determine baseline cognitive functioning                   Orientation Level: Disoriented to, Time, Situation Current Attention Level: Focused   Following Commands: Follows one step commands inconsistently, Follows one step commands with increased time Safety/Judgement: Decreased awareness of deficits, Decreased awareness of safety Awareness:  Intellectual Problem Solving: Slow processing, Decreased initiation General Comments: Pt very lethargic requiring frequent verbal cues.  Very slow to respond and very little communication during session - did state her name, location, and said "wait a minute" several times        Exercises General Exercises - Lower Extremity Ankle Circles/Pumps: AROM, Both, 5 reps Long Arc Quad: AROM, Both, 5 reps, Seated (assist for balance and min cues for controlled full ROM)    General Comments General comments (skin integrity, edema, etc.): VSS on 2 L      Pertinent Vitals/Pain Pain Assessment Pain Assessment: Faces Faces Pain Scale: No hurt    Home Living                          Prior Function            PT Goals (current goals can now be found in the care plan section) Progress towards PT goals: Progressing toward goals    Frequency    Min 2X/week      PT Plan Current plan remains appropriate    Co-evaluation              AM-PAC PT "6 Clicks" Mobility   Outcome Measure  Help needed turning from your back to your side while in a flat bed without using bedrails?: Total Help needed moving from lying on your back to sitting on the side of a flat bed without using bedrails?: Total Help needed moving to and from a bed to a chair (including a wheelchair)?: Total Help needed standing up from a chair using your arms (e.g., wheelchair or bedside chair)?: Total Help needed to walk in hospital room?: Total Help needed climbing 3-5 steps with a railing? : Total 6 Click Score: 6    End of Session Equipment Utilized During Treatment: Oxygen Activity Tolerance: Patient limited by fatigue Patient left: in bed;with call bell/phone within reach;with bed alarm set;with SCD's reapplied Nurse Communication: Mobility status;Need for lift equipment PT Visit Diagnosis: Muscle weakness (generalized) (M62.81);Other abnormalities of gait and mobility (R26.89);Hemiplegia and  hemiparesis Hemiplegia - Right/Left: Left Hemiplegia - dominant/non-dominant: Non-dominant Hemiplegia - caused by: Unspecified     Time: 2025-4270 PT Time Calculation (min) (ACUTE ONLY): 25 min  Charges:  $Therapeutic Activity: 23-37 mins                     Abran Richard, PT Acute Rehab [facility] [phone]    Stephanie Benton 09/02/2022, 2:30 PM

## 2022-09-02 NOTE — TOC Transition Note (Addendum)
Transition of Care [facility]) - CM/SW Discharge Note   Patient Details  Name: Stephanie Benton MRN: [id number] Date of Birth: July 05, 1951  Transition of Care [facility]) CM/SW Contact:  Vassie Moselle, LCSW Phone Number: 09/02/2022, 11:02 AM   Clinical Narrative:    Pt is to return to [facility] for STR. Pt will be going to room 121a. RN to call report to [phone]. Spoke with pt's son and confirmed discharge plans. Pt's son plans to being searching for LTC placement for pt following SNF. PTAR will be arranged for transportation needs.   Update 12:50pm- Pt no longer medically ready to DC. TOC will continue to follow for discharge plans.    Final next level of care: Skilled Nursing Facility Barriers to Discharge: Barriers Resolved   Patient Goals and CMS Choice   Choice offered to / list presented to : Adult Children  Discharge Placement     Existing PASRR number confirmed : 08/15/22                Patient and family notified of of transfer: 09/02/22  Discharge Plan and Services Additional resources added to the After Visit Summary for       Post Acute Care Choice: [place]          DME Arranged: N/A DME Agency: NA       HH Arranged: NA HH Agency: NA        Social Determinants of Health ([place]) Interventions SDOH Screenings   Food Insecurity: No Food Insecurity (08/19/2022)  Housing: Low Risk  (08/19/2022)  Recent Concern: Housing - Medium Risk (06/21/2022)  Transportation Needs: No Transportation Needs (08/19/2022)  Recent Concern: Transportation Needs - Unmet Transportation Needs (06/21/2022)  Utilities: Not At Risk (08/19/2022)  Tobacco Use: High Risk (08/25/2022)     Readmission Risk Interventions    09/02/2022   11:01 AM 07/22/2022   11:58 AM 07/18/2022    8:43 AM  Readmission Risk Prevention Plan  Transportation Screening Complete  Complete  Medication Review ([place]) Complete  Complete  PCP or Specialist appointment within 3-5 days of  discharge Complete  Complete  HRI or Home Care Consult Complete  Complete  SW Recovery Care/Counseling Consult Complete  Complete  Palliative Care Screening   Not [place] Complete Complete Not Applicable

## 2022-09-02 NOTE — Progress Notes (Addendum)
[facility] PROGRESS NOTE    Progress Note  Stephanie Benton  WNU:[id number] DOB: 03-25-1951 DOA: 08/18/2022 PCP: Sharmon Revere, MD     Brief Narrative:   Stephanie Benton is an 72 y.o. female past medical history of COPD, bipolar disorder pancreatitis coming from SNF for increased dyspnea she was actually discharged from the hospital on 08/17/2021 during this hospital stay she was treated empirically with antibiotics and discharged on Omnicef on room air.  She returned the next day with chest opacification suggestive of mucous plugging requiring 3 to 5 L with a white count of 20,000 admitted to the ICU started empirically on antibiotics hypertonic saline repeated chest x-ray was unchanged PCCM was consulted status post FOB on 08/21/2022 with removal of thick secretions failed extubation postprocedural concern for GI bleed on 08/22/2022 with dark tarry stools  Significant Events: 1/15 presented with L hemithorax opacification and dyspnea, PCCM consult 1/16 more somnolent with Hgb drop,CXR without improvement in L-sided opacification, transfused 2 units PRBC for hemoglobin 5.5 --> 10 1/17 bronchoscopy with removal of thick mucous plugs from left mainstem and segmental bronchi, failed extubation postprocedure and transferred to ICU 1/18 Concern for GIB, Hgb drop 5.5, GI consulted. Remains on vent 1/20 antibiotics broadened to meropenem due to fevers and leukocytosis 1/21 Remains on vent, off pressors / precedex, more responsive 1/22 extubated    Assessment/Plan:   Acute respiratory failure with hypoxia requiring mechanical ventilation status post bronchoscopy to the left hemithorax due to atelectasis due to mucous plugging: Completed long course of antibiotics of IV Vanco and meropenem. Patient had been weaned to room. After I saw the nurse called that her she started getting labored breathing around noon. Breathing 2 5 times per minutes, has to be place on 2 L of oxygen. CXR done that  showed re-opacification of left lung.  Concern about mucus plugging of left side again. Consult PCCM, recommended to transfer to stepdown and aggressive chest physiotherapy and NTS. Transfer to stepdown.  COVID-19 positive: She has completed her isolation on 08/23/2022.  Acute metabolic encephalopathy: Likely due to delirium continue melatonin at night.  Acute blood loss anemia: Status post 2 units of packed red blood cells due to black tarry stools on 08/22/2018. GI was consulted perform an EGD on 08/23/2022 showed gastritis. Has hemoglobin 8.7.  Hypokalemia/hypophosphatemia: Potassium is low today replete IV and orally recheck tomorrow morning.  Severe protein malnutrition/dysphagia: Off tube feedings.  Tolerating her diet  Goals of care/ethics: Palliative care to discuss with family goals of care.  Significant muscular deconditioning: PT OT evaluated the patient will need skilled nursing facility.  Sacral decubitus ulcer present on admission RN Pressure Injury Documentation: Pressure Injury 08/22/22 Buttocks Left;Medial;Mid Stage 2 -  Partial thickness loss of dermis presenting as a shallow open injury with a red, pink wound bed without slough. (Active)  08/22/22 0730  Location: Buttocks  Location Orientation: Left;Medial;Mid  Staging: Stage 2 -  Partial thickness loss of dermis presenting as a shallow open injury with a red, pink wound bed without slough.  Wound Description (Comments):   Present on Admission: Yes  Dressing Type Foam - Lift dressing to assess site every shift 09/01/22 2044     Pressure Injury 08/22/22 Buttocks Left;Medial;Mid;Upper Stage 2 -  Partial thickness loss of dermis presenting as a shallow open injury with a red, pink wound bed without slough. (Active)  08/22/22 0730  Location: Buttocks  Location Orientation: Left;Medial;Mid;Upper  Staging: Stage 2 -  Partial thickness loss of dermis  presenting as a shallow open injury with a red, pink wound bed  without slough.  Wound Description (Comments):   Present on Admission: Yes  Dressing Type Foam - Lift dressing to assess site every shift 09/01/22 2044   DVT prophylaxis: lovenox Family Communication:none Status is: Inpatient Remains inpatient appropriate because: Acute respiratory failure with hypoxia    Code Status:     Code Status Orders  (From admission, onward)           Start     Ordered   08/26/22 1532  Do not attempt resuscitation (DNR)  Continuous       Question Answer Comment  If patient has no pulse and is not breathing Do Not Attempt Resuscitation   If patient has a pulse and/or is breathing: Medical Treatment Goals LIMITED ADDITIONAL INTERVENTIONS: Use medication/IV fluids and cardiac monitoring as indicated; Do not use intubation or mechanical ventilation (DNI), also provide comfort medications.  Transfer to Progressive/Stepdown as indicated, avoid Intensive Care.   Consent: Discussion documented in EHR or advanced directives reviewed      08/26/22 1532           Code Status History     Date Active Date Inactive Code Status Order ID Comments User Context   08/18/2022 2146 08/26/2022 1532 DNR [id number]  Stephanie Emery, MD ED   08/13/2022 1932 08/18/2022 1704 DNR [id number]  Stephanie Doyne, MD ED   07/20/2022 1729 07/22/2022 1925 DNR [id number]  Stephanie Glass, MD Inpatient   07/16/2022 0445 07/20/2022 1729 Full Code [id number]  Stephanie Giovanni, MD ED   07/08/2022 0840 07/13/2022 1806 Full Code [id number]  Stephanie Mo, MD ED   06/21/2022 1753 06/25/2022 1934 Full Code [id number]  Stephanie Mo, MD Inpatient   06/21/2022 1555 06/21/2022 1753 Full Code [id number]  Stephanie Mo, MD ED   05/17/2022 0147 05/23/2022 1715 Full Code [id number]  Stephanie Doyne, MD ED   02/17/2018 0446 02/18/2018 1413 Full Code [id number]  Stephanie Deutscher, MD Inpatient      Advance Directive Documentation    Flowsheet Row Most Recent Value  Type of Advance  Directive Healthcare Power of Attorney, Living will  Pre-existing out of facility DNR order (yellow form or pink MOST form) --  "MOST" Form in Place? --         IV Access:   Peripheral IV   Procedures and diagnostic studies:   DG CHEST PORT 1 VIEW  Result Date: 09/02/2022 CLINICAL DATA:  Difficulty breathing.  Dyspnea. EXAM: PORTABLE CHEST 1 VIEW COMPARISON:  Radiographs 08/27/2022, 08/25/2022 and 08/23/2022. CT 08/13/2022. FINDINGS: 1201 hours. Left arm PICC projects to the superior cavoatrial junction. The heart size and mediastinal contours are stable. There is aortic atherosclerosis. There is progressive left lower lobe consolidation with progressive volume loss in the left hemithorax. The right lung remains clear. No evidence of pneumothorax or edema. There may be a small amount of pleural fluid on the left. IMPRESSION: Progressive left lower lobe consolidation with progressive volume loss in the left hemithorax. Findings remain most consistent with progressive left lower lobe pneumonia based on previous CT. Correlate clinically. Continued radiographic follow-up recommended. Electronically Signed   By: Carey Bullocks M.D.   On: 09/02/2022 12:23     Medical Consultants:   None.   Subjective:    NILZA EAKER is having labored breathing  Objective:    Vitals:   09/01/22 2115 09/02/22 0350 09/02/22 0751 09/02/22 1117  BP: (!) 152/85 (!) 172/81  Marland Kitchen)  156/78  Pulse: 93 92  96  Resp: 20 20  (!) 25  Temp: 98.3 F (36.8 C) 98.3 F (36.8 C)    TempSrc: Oral Oral    SpO2: 100% 100% 95% 100%  Weight:  80.9 kg    Height:       SpO2: 100 % O2 Flow Rate (L/min): (S) 2 L/min (to RA post neb) FiO2 (%): 30 %   Intake/Output Summary (Last 24 hours) at 09/02/2022 1501 Last data filed at 09/02/2022 1115 Gross per 24 hour  Intake 358 ml  Output 550 ml  Net -192 ml    Filed Weights   08/30/22 0500 08/31/22 0500 09/02/22 0350  Weight: 80.4 kg 84.5 kg 80.9 kg     Exam: General exam: In no acute distress. Respiratory system: tachypneic with decrease breathing sounds on the left. Cardiovascular system: S1 & S2 heard, RRR. No JVD. Gastrointestinal system: Abdomen is nondistended, soft and nontender.  Extremities: No pedal edema. Skin: Sacral decubitus ulcer stage II   Data Reviewed:    Labs: Basic Metabolic Panel: Recent Labs  Lab 08/27/22 0341 08/28/22 0441 08/29/22 0600 08/30/22 0413 08/31/22 0159 09/01/22 0255  NA 144 145 145 147* 147* 146*  K 3.2* 3.0* 2.9* 2.8* 3.2* 3.1*  CL 119* 118* 120* 117* 121* 117*  CO2 20* 20* 21* 21* 21* 21*  GLUCOSE 123* 106* 112* 128* 88 96  BUN 24* 19 14 11  7* 7*  CREATININE 0.78 0.69 0.55 0.52 0.54 0.55  CALCIUM 8.2* 8.0* 8.0* 8.0* 7.7* 8.0*  MG 2.0 1.9 2.2  --   --   --   PHOS 2.4* 2.0* 2.1* 2.8 2.9 2.8    GFR Estimated Creatinine Clearance: 66.8 mL/min (by C-G formula based on SCr of 0.55 mg/dL). Liver Function Tests: Recent Labs  Lab 08/30/22 0413 08/31/22 0159 09/01/22 0255  ALBUMIN 1.7* 1.8* 1.6*    No results for input(s): "LIPASE", "AMYLASE" in the last 168 hours. No results for input(s): "AMMONIA" in the last 168 hours. Coagulation profile No results for input(s): "INR", "PROTIME" in the last 168 hours. COVID-19 Labs  No results for input(s): "DDIMER", "FERRITIN", "LDH", "CRP" in the last 72 hours.  Lab Results  Component Value Date   SARSCOV2NAA POSITIVE (A) 08/13/2022   SARSCOV2NAA NEGATIVE 07/15/2022   [place] NEGATIVE 05/17/2022   [place] NEGATIVE 10/11/2019    CBC: Recent Labs  Lab 08/27/22 0341  WBC 17.9*  HGB 8.7*  HCT 27.9*  MCV 93.0  PLT 128*    Cardiac Enzymes: No results for input(s): "CKTOTAL", "CKMB", "CKMBINDEX", "TROPONINI" in the last 168 hours. BNP (last 3 results) No results for input(s): "PROBNP" in the last 8760 hours. CBG: Recent Labs  Lab 09/01/22 2118 09/01/22 2358 09/02/22 0348 09/02/22 0731 09/02/22 1143  GLUCAP  107* 93 88 85 94    D-Dimer: No results for input(s): "DDIMER" in the last 72 hours. Hgb A1c: No results for input(s): "HGBA1C" in the last 72 hours. Lipid Profile: No results for input(s): "CHOL", "HDL", "LDLCALC", "TRIG", "CHOLHDL", "LDLDIRECT" in the last 72 hours.  Thyroid function studies: No results for input(s): "TSH", "T4TOTAL", "T3FREE", "THYROIDAB" in the last 72 hours.  Invalid input(s): "FREET3" Anemia work up: No results for input(s): "VITAMINB12", "FOLATE", "FERRITIN", "TIBC", "IRON", "RETICCTPCT" in the last 72 hours. Sepsis Labs: Recent Labs  Lab 08/27/22 0341  WBC 17.9*    Microbiology No results found for this or any previous visit (from the past 240 hour(s)).    Medications:    (  feeding supplement) PROSource Plus  30 mL Oral BID BM   ascorbic acid  500 mg Oral Daily   carvedilol  12.5 mg Oral BID WC   Chlorhexidine Gluconate Cloth  6 each Topical Daily   dorzolamide  1 drop Both Eyes BID   DULoxetine  40 mg Oral Daily   feeding supplement  1 Container Oral TID BM   ferrous sulfate  300 mg Oral BID   fesoterodine  4 mg Oral Daily   folic acid  1 mg Oral Daily   ipratropium-albuterol  3 mL Nebulization BID   latanoprost  1 drop Both Eyes QHS   losartan  50 mg Oral Daily   multivitamin with minerals  1 tablet Oral Daily   nicotine  14 mg Transdermal Daily   pantoprazole  40 mg Oral BID   polyethylene glycol  17 g Oral Daily   sodium chloride HYPERTONIC  4 mL Nebulization Q4H   Vitamin D (Ergocalciferol)  50,000 Units Oral Q7 days   vitamin E  400 Units Oral Daily   Continuous Infusions:  sodium chloride 1,000 mL (08/31/22 1540)   sodium chloride Stopped (08/27/22 0957)      LOS: 15 days   Charlynne Cousins  [facility]  09/02/2022, 3:01 PM

## 2022-09-02 NOTE — Progress Notes (Signed)
Attempted NTS with Rns  at bedside. Hard to direct the catheter inferiorly in her pharynx, and no cough triggerd, only gagging. She vomitied a small volume of clear liquid. When asked to cough, it is weak and ineffective. Has not yet had her first CPT yet.   Julian Hy, DO 09/02/22 5:42 PM [facility]

## 2022-09-02 NOTE — Discharge Summary (Deleted)
Physician Discharge Summary  Stephanie Benton QVZ:[id number] DOB: 1950-09-11 DOA: 08/18/2022  PCP: Sharmon Revere, MD  Admit date: 08/18/2022 Discharge date: 09/02/2022  Admitted From: SNF Disposition:  SNF  Recommendations for Outpatient Follow-up:  Follow up with PCP in 1-2 weeks   Home Health:No Equipment/Devices:None  Discharge Condition:Stable CODE STATUS:DNR Diet recommendation: Heart Healthy   Brief/Interim Summary:  72 y.o. female past medical history of COPD, bipolar disorder pancreatitis coming from SNF for increased dyspnea she was actually discharged from the hospital on 08/17/2021 during this hospital stay she was treated empirically with antibiotics and discharged on Omnicef on room air.  She returned the next day with chest opacification suggestive of mucous plugging requiring 3 to 5 L with a white count of 20,000 admitted to the ICU started empirically on antibiotics hypertonic saline repeated chest x-ray was unchanged PCCM was consulted status post FOB on 08/21/2022 with removal of thick secretions failed extubation postprocedural concern for GI bleed on 08/22/2022 with dark tarry stools   Significant Events: 1/15 presented with L hemithorax opacification and dyspnea, PCCM consult 1/16 more somnolent with Hgb drop,CXR without improvement in L-sided opacification, transfused 2 units PRBC for hemoglobin 5.5 --> 10 1/17 bronchoscopy with removal of thick mucous plugs from left mainstem and segmental bronchi, failed extubation postprocedure and transferred to ICU 1/18 Concern for GIB, Hgb drop 5.5, GI consulted. Remains on vent 1/20 antibiotics broadened to meropenem due to fevers and leukocytosis 1/21 Remains on vent, off pressors / precedex, more responsive 1/22 extubated  Discharge Diagnoses:  Principal Problem:   Severe sepsis with acute organ dysfunction (HCC) Active Problems:   Hypertension   Tobacco abuse counseling   Hypercholesterolemia   Bipolar disorder, in  full remission, most recent episode mixed (HCC)   LFTs abnormal   Hypokalemia   COPD (chronic obstructive pulmonary disease) (HCC)   Severe sepsis (HCC)   Acute metabolic encephalopathy   HCAP (healthcare-associated pneumonia)   Tobacco abuse   Atelectasis of left lung   Acute respiratory failure with hypoxia (HCC)  Acute respiratory failure with hypoxia requiring mechanical ventilation status post bronchoscopy with left hemithorax lacks disease due to mucous plug: She was started empirically on antibiotics she completed her course in-house. Extubated and weaned to room air. Physical therapy evaluated the patient recommended home health PT.  History of COVID-19 positivity: She completed her isolation on 08/23/2022.  Acute metabolic encephalopathy: Resolved.  Acute blood loss anemia: She status post 2 units packed red blood cells she did have black tarry stools on 08/22/2018 4 GI was consulted for an EGD show  gastritis.  Continue PPI as an outpatient.  Hypokalemia/hypophosphatemia: Repleted now resolved.  Severe protein caloric malnutrition/dysphagia: She was initially requiring tube feedings these were weaned off she was tolerating her diet.  Palliative care/ethics: Goals of care discussed with the family the patient is a DNR.  Significant muscular dystrophy: Physical therapy evaluated the patient recommended skilled nursing facility.  Stage II sacral decubitus ulcer present on admission  Discharge Instructions  Discharge Instructions     Diet - low sodium heart healthy   Complete by: As directed    Discharge wound care:   Complete by: As directed    Per wound care instructions   Increase activity slowly   Complete by: As directed       Allergies as of 09/02/2022       Reactions   Fish Allergy Anaphylaxis   Morphine And Related Anaphylaxis, Swelling   Belsomra [suvorexant] Other (See Comments)  Nightmares    Codeine Nausea And Vomiting   Duragesic-100  [fentanyl] Hives   Motrin [ibuprofen] Nausea Only, Other (See Comments)   History of stomach ulcers   Nsaids Other (See Comments)   History of stomach ulcers   Penicillins Hives   Did it involve swelling of the face/tongue/throat, SOB, or low BP? Yes Did it involve sudden or severe rash/hives, skin peeling, or any reaction on the inside of your mouth or nose? Yes Did you need to seek medical attention at a hospital or doctor's office? Yes When did it last happen? More than 10 years ago If all above answers are "NO", may proceed with cephalosporin use.   Sulfa Antibiotics Swelling   Not listed on MAR   Tolectin [tolmetin] Other (See Comments)   History of stomach ulcers Not listed on MAR        Medication List     STOP taking these medications    cefdinir 300 MG capsule Commonly known as: OMNICEF       TAKE these medications    albuterol (2.5 MG/3ML) 0.083% nebulizer solution Commonly known as: PROVENTIL Take 2.5 mg by nebulization every 6 (six) hours as needed for wheezing or shortness of breath.   ascorbic acid 500 MG tablet Commonly known as: VITAMIN C Take 1 tablet (500 mg total) by mouth daily. What changed:  when to take this additional instructions   budesonide-formoterol 160-4.5 MCG/ACT inhaler Commonly known as: SYMBICORT Inhale 2 puffs into the lungs every 12 (twelve) hours as needed (wheezing).   carvedilol 25 MG tablet Commonly known as: COREG Take 1 tablet (25 mg total) by mouth 2 (two) times daily with a meal.   COLLAGEN EX Apply 1 application  topically See admin instructions. Cleanse right knee, pat dry, apply collagen and cover with silicone dressing every Monday, Wednesday, Friday during day shift.   dorzolamide 2 % ophthalmic solution Commonly known as: TRUSOPT Place 1 drop into both eyes 2 (two) times daily. (1000, 1800)   DULoxetine HCl 40 MG Csdr Take 40 mg by mouth in the morning. (1000)   Ensure Original Liqd Take 1 each by mouth 2  (two) times daily. (1000, 1800)   ferrous sulfate 325 (65 FE) MG tablet Take 325 mg by mouth 2 (two) times daily. (1000, 1800)   folic acid 1 MG tablet Commonly known as: FOLVITE Take 1 mg by mouth daily.   latanoprost 0.005 % ophthalmic solution Commonly known as: XALATAN Place 1 drop into both eyes at bedtime. (2100)   losartan 100 MG tablet Commonly known as: COZAAR Take 0.5 tablets (50 mg total) by mouth daily. (1000)   multivitamin with minerals tablet Take 1 tablet by mouth in the morning. (1000)   ondansetron 4 MG tablet Commonly known as: ZOFRAN Take 4 mg by mouth every 8 (eight) hours as needed for nausea or vomiting.   pantoprazole 40 MG tablet Commonly known as: PROTONIX Take 40 mg by mouth 2 (two) times daily. (0600, 1600)   polyethylene glycol powder 17 GM/SCOOP powder Commonly known as: GLYCOLAX/MIRALAX Take 1 cap full (17 g) with water by mouth daily. What changed:  when to take this additional instructions   potassium chloride 10 MEQ tablet Commonly known as: KLOR-CON Take 20 mEq by mouth 2 (two) times daily. (0900, 1800)   tolterodine 2 MG tablet Commonly known as: DETROL Take 2 mg by mouth in the morning. (1000)   torsemide 10 MG tablet Commonly known as: DEMADEX Take 10 mg by  mouth daily as needed (edema).   traMADol 50 MG tablet Commonly known as: ULTRAM Take 50 mg by mouth every 8 (eight) hours as needed for severe pain or moderate pain.   Vitamin D (Ergocalciferol) 1.25 MG (50000 UNIT) Caps capsule Commonly known as: DRISDOL Take 1 capsule (50,000 Units total) by mouth every 7 (seven) days. What changed:  when to take this additional instructions               Discharge Care Instructions  (From admission, onward)           Start     Ordered   09/02/22 0000  Discharge wound care:       Comments: Per wound care instructions   09/02/22 0957            Allergies  Allergen Reactions   Fish Allergy Anaphylaxis    Morphine And Related Anaphylaxis and Swelling   Belsomra [Suvorexant] Other (See Comments)    Nightmares     Codeine Nausea And Vomiting   Duragesic-100 [Fentanyl] Hives   Motrin [Ibuprofen] Nausea Only and Other (See Comments)    History of stomach ulcers   Nsaids Other (See Comments)    History of stomach ulcers   Penicillins Hives    Did it involve swelling of the face/tongue/throat, SOB, or low BP? Yes Did it involve sudden or severe rash/hives, skin peeling, or any reaction on the inside of your mouth or nose? Yes Did you need to seek medical attention at a hospital or doctor's office? Yes When did it last happen? More than 10 years ago If all above answers are "NO", may proceed with cephalosporin use.    Sulfa Antibiotics Swelling    Not listed on MAR   Tolectin [Tolmetin] Other (See Comments)    History of stomach ulcers Not listed on MAR    Consultations: Pulmonary care   Procedures/Studies: DG CHEST PORT 1 VIEW  Result Date: 08/27/2022 CLINICAL DATA:  Provided history: Acute respiratory failure with hypoxia. EXAM: PORTABLE CHEST 1 VIEW COMPARISON:  Prior chest radiographs 08/25/2022 and earlier. FINDINGS: Previously demonstrated ET and enteric tubes no longer appreciated. Left-sided PICC with tip at the level of the lower SVC. The cardiomediastinal silhouette is unchanged. Aortic atherosclerosis. The patient's chin obscures significant portions of the right lung apex. Left pleural effusion with underlying left basilar atelectasis and/or consolidation, similar to the prior chest radiograph of 08/25/2022. Background prominence of the interstitial lung markings bilaterally, suspicious for interstitial edema. No evidence of pneumothorax. IMPRESSION: 1. Please note, the patient's chin obscures significant portions of the right lung apex. 2. Left pleural effusion with underlying left basilar atelectasis and/or consolidation, similar to the prior chest radiograph of 08/25/2022. 3.  New from the prior exam, there is prominence of the interstitial lung markings, suspicious for interstitial edema. 4. Aortic Atherosclerosis (ICD10-I70.0). Electronically Signed   By: Kellie Simmering D.O.   On: 08/27/2022 08:20   [name] EKG SITE RITE  Result Date: 08/26/2022 If Site Rite image not attached, placement could not be confirmed due to current cardiac rhythm.  DG Chest Port 1 View  Result Date: 08/25/2022 CLINICAL DATA:  Acute respiratory failure. EXAM: PORTABLE CHEST 1 VIEW COMPARISON:  Chest radiograph 08/23/2022 FINDINGS: ET tube midtrachea. Enteric tube courses inferior to the diaphragm. Monitoring leads overlie the patient. Stable cardiac and mediastinal contours. Layering left effusion with underlying consolidation. Right lung is clear. No pneumothorax. IMPRESSION: Layering left effusion with underlying consolidation. Electronically Signed   By: Dian Situ  Earlene Plater M.D.   On: 08/25/2022 09:43   DG Chest Port 1 View  Result Date: 08/23/2022 CLINICAL DATA:  Acute respiratory failure EXAM: PORTABLE CHEST 1 VIEW COMPARISON:  08/22/2022 FINDINGS: Endotracheal tube with the tip 4.3 cm above the carina. Nasogastric tube with the tip projecting over the stomach. Small left pleural effusion. Left lower lobe airspace disease which may reflect atelectasis versus pneumonia. No pneumothorax. Heart and mediastinal contours are unremarkable. Moderate-sized hiatal hernia. No acute osseous abnormality. IMPRESSION: 1. Support lines and tubing in satisfactory position. 2. Small left pleural effusion. Left lower lobe airspace disease which may reflect atelectasis versus pneumonia. Electronically Signed   By: Elige Ko M.D.   On: 08/23/2022 08:13   Portable Chest xray  Result Date: 08/22/2022 CLINICAL DATA:  Intubated EXAM: PORTABLE CHEST 1 VIEW COMPARISON:  Chest radiograph from one day prior. FINDINGS: Endotracheal tube tip is 1.8 cm above the carina. Enteric tube enters stomach with the tip not seen on this  image. Stable cardiomediastinal silhouette with normal heart size. No pneumothorax. Small left pleural effusion, probably stable accounting for differences in technique. No overt pulmonary edema. Similar left retrocardiac opacity. IMPRESSION: 1. Endotracheal tube tip is 1.8 cm above the carina, consider retracting 1 cm. 2. Small left pleural effusion, probably stable accounting for differences in technique. 3. Similar left retrocardiac opacity, favor left lower lobe atelectasis. Electronically Signed   By: Delbert Phenix M.D.   On: 08/22/2022 08:20   DG Abd 1 View  Result Date: 08/21/2022 CLINICAL DATA:  [id number] Encounter for nasogastric (NG) tube placement [id number] EXAM: ABDOMEN - 1 VIEW COMPARISON:  X-ray abdomen 05/20/2022 FINDINGS: Enteric tube courses below the hemidiaphragm with tip and side port overlying the expected region of the gastric lumen. The bowel gas pattern is normal. Surgical tacks overlie the mid abdomen consistent with hernia repair. Right upper quadrant surgical clips. No radio-opaque calculi or other significant radiographic abnormality are seen. IMPRESSION: 1. Enteric tube in good position. 2. Nonobstructive bowel gas pattern. Electronically Signed   By: Tish Frederickson M.D.   On: 08/21/2022 17:06   Portable Chest x-ray  Result Date: 08/21/2022 CLINICAL DATA:  Intubated EXAM: PORTABLE CHEST 1 VIEW COMPARISON:  08/20/2022 FINDINGS: Single frontal view of the chest demonstrates endotracheal tube overlying tracheal air column tip midway between thoracic inlet and carina. Marked improved aeration in the left chest since prior study. Residual density at the left lung base consistent with residual lung consolidation and likely small effusion. The right chest is clear. No pneumothorax. No acute bony abnormalities. IMPRESSION: 1. No complication after intubation. 2. Interval improvement in aeration of the left lung, with persistent left basilar consolidation and small left effusion. Electronically  Signed   By: Sharlet Salina M.D.   On: 08/21/2022 16:04   DG CHEST PORT 1 VIEW  Result Date: 08/20/2022 CLINICAL DATA:  Pneumonia.  COVID-19. EXAM: PORTABLE CHEST 1 VIEW COMPARISON:  August 19, 2022 FINDINGS: The left hemithorax is completely opacified. The tiny amount of aerated lung in the left upper lobe on the previous study is now opacified. The cardiomediastinal silhouette is not well assessed due to left lung opacification. No obvious abnormality. No pneumothorax on the right. No focal infiltrate on the right. No overt edema. IMPRESSION: The left hemithorax is completely opacified. The tiny amount of aerated lung in the left upper lobe on the previous study is now opacified. The findings are concerning for a large pleural effusion with underlying atelectasis or infiltrate. Electronically Signed   By:  Dorise Bullion III M.D.   On: 08/20/2022 07:56   DG CHEST PORT 1 VIEW  Result Date: 08/19/2022 CLINICAL DATA:  Collapse of left lung. EXAM: PORTABLE CHEST 1 VIEW COMPARISON:  Chest x-ray 08/19/2022. FINDINGS: Unchanged near complete opacification left hemithorax with volume loss. Right lung is clear. No visible pneumothorax. Polyarticular degenerative change. Cardiomediastinal silhouette is largely obscured. IMPRESSION: Unchanged near complete opacification left hemithorax with volume loss, further described on chest x-ray from yesterday. Recommend imaging follow-up to resolution. Electronically Signed   By: Margaretha Sheffield M.D.   On: 08/19/2022 08:25   DG Chest 2 View  Result Date: 08/18/2022 CLINICAL DATA:  Dyspnea, altered mental status, hypoxia, suspected sepsis EXAM: CHEST - 2 VIEW COMPARISON:  08/13/2022 chest radiograph. FINDINGS: Left rotated chest radiograph. New complete opacification of the left hemithorax with left mediastinal shift and volume loss in the left hemithorax. No pneumothorax. Clear right lung. No right pleural effusion. IMPRESSION: New complete opacification of the left  hemithorax with left mediastinal shift and volume loss in the left hemithorax. Given the rapid change since 08/13/2022 chest radiograph, findings most likely represent mucous plugging in the central left lung airways with complete left lung atelectasis, with underlying pneumonia not excluded. Chest radiograph follow-up advised at minimum. Electronically Signed   By: Ilona Sorrel M.D.   On: 08/18/2022 18:45   MR BRAIN WO CONTRAST  Result Date: 08/14/2022 CLINICAL DATA:  Initial evaluation for delirium. EXAM: MRI HEAD WITHOUT CONTRAST TECHNIQUE: Multiplanar, multiecho pulse sequences of the brain and surrounding structures were obtained without intravenous contrast. COMPARISON:  CT from 08/13/2022. FINDINGS: Brain: Examination moderately degraded by motion artifact. Generalized age-related cerebral atrophy. Patchy and confluent T2/FLAIR hyperintensity involving the supratentorial cerebral white matter, most characteristic of chronic microvascular ischemic disease, mild to moderate in nature. No evidence for acute or subacute ischemia. Gray-white matter differentiation maintained. No areas of chronic cortical infarction. No visible acute or chronic intracranial blood products. No mass lesion, midline shift or mass effect. No hydrocephalus or extra-axial fluid collection. Vascular: Major intracranial vascular flow voids are maintained. Skull and upper cervical spine: Craniocervical junction grossly within normal limits. Bone marrow signal intensity grossly normal. No scalp soft tissue abnormality. Sinuses/Orbits: Prior ocular lens replacement on the right. Globes orbital soft tissues demonstrate no acute finding. Mild mucosal thickening about the ethmoidal air cells. No mastoid effusion. Other: None. IMPRESSION: 1. No acute intracranial abnormality. 2. Age-related cerebral atrophy with mild to moderate chronic microvascular ischemic disease. Electronically Signed   By: Jeannine Boga M.D.   On: 08/14/2022  02:42   CT HEAD WO CONTRAST (5MM)  Result Date: 08/13/2022 CLINICAL DATA:  Mental status change, unknown cause EXAM: CT HEAD WITHOUT CONTRAST TECHNIQUE: Contiguous axial images were obtained from the base of the skull through the vertex without intravenous contrast. RADIATION DOSE REDUCTION: This exam was performed according to the departmental dose-optimization program which includes automated exposure control, adjustment of the mA and/or kV according to patient size and/or use of iterative reconstruction technique. COMPARISON:  CT head 07/15/2022. FINDINGS: Brain: No evidence of acute infarction, hemorrhage, hydrocephalus, extra-axial collection or mass lesion/mass effect. Vascular: No hyperdense vessel. Skull: No acute fracture. Sinuses/Orbits: Clear sinuses.  No acute orbital findings. Other: No mastoid effusions. IMPRESSION: No evidence of acute intracranial abnormality. Electronically Signed   By: Margaretha Sheffield M.D.   On: 08/13/2022 17:49   CT CHEST ABDOMEN PELVIS W CONTRAST  Result Date: 08/13/2022 CLINICAL DATA:  Sepsis fever, lactic acidosis EXAM: CT CHEST, ABDOMEN,  AND PELVIS WITH CONTRAST TECHNIQUE: Multidetector CT imaging of the chest, abdomen and pelvis was performed following the standard protocol during bolus administration of intravenous contrast. RADIATION DOSE REDUCTION: This exam was performed according to the departmental dose-optimization program which includes automated exposure control, adjustment of the mA and/or kV according to patient size and/or use of iterative reconstruction technique. CONTRAST:  49mL OMNIPAQUE IOHEXOL 350 MG/ML SOLN COMPARISON:  None Available. FINDINGS: CT CHEST FINDINGS Cardiovascular: Calcific atherosclerosis of the aorta and coronary arteries. Normal heart size. No pericardial effusion. Mediastinum/Nodes: Moderate size hiatal hernia. Lungs/Pleura: Left lower lobe consolidation. No pleural effusions or pneumothorax. Musculoskeletal: Healing subacute left  posterior eleventh rib fracture. CT ABDOMEN PELVIS FINDINGS Hepatobiliary: No focal liver abnormality is seen. Status post cholecystectomy. No biliary dilatation. Pancreas: Unremarkable. No pancreatic ductal dilatation or surrounding inflammatory changes. Spleen: Normal in size without focal abnormality. Adrenals/Urinary Tract: Adrenal glands are unremarkable. Kidneys are normal, without renal calculi, focal lesion, or hydronephrosis. Bladder is unremarkable. Small amount of ill-defined fluid adjacent to the right kidney, which appears similar. Stomach/Bowel: No evidence of bowel obstruction. Stool within the colon. No evidence of bowel wall thickening or inflammatory change. Vascular/Lymphatic: Aortic atherosclerosis. No enlarged abdominal or pelvic lymph nodes. Reproductive: Status post hysterectomy. No adnexal masses. Other: Stable chronic ovoid fluid collection along the anterior abdominal wall above the umbilicus. This is associated with a ventral mesh hernia repair. Similar adjacent calcified structure. Musculoskeletal: Degenerative changes, greatest in the lower lumbar spine. IMPRESSION: 1. Left lower lobe consolidation, suspicious for pneumonia and/or aspiration. 2. Healing subacute left posterior eleventh rib fracture. 3. Stable chronic fluid collection along the anterior mesh from prior ventral hernia repair. Moderately sized hiatal hernia. 4.  Aortic Atherosclerosis (ICD10-I70.0). Electronically Signed   By: Feliberto Harts M.D.   On: 08/13/2022 17:38   DG Chest Portable 1 View  Result Date: 08/13/2022 CLINICAL DATA:  Central line placement EXAM: PORTABLE CHEST 1 VIEW COMPARISON:  08/13/2022 at 2:01 p.m. FINDINGS: Right internal jugular central venous catheter present with tip at the brachiocephalic confluence with the SVC. No pneumothorax. Atherosclerotic calcification of the aortic arch. Mild enlargement of the cardiopericardial silhouette noted with retrocardiac opacity compatible with  moderate-sized hiatal hernia. IMPRESSION: 1. Right internal jugular central venous catheter tip is at the brachiocephalic confluence with the SVC. No pneumothorax. 2. Mild enlargement of the cardiopericardial silhouette. 3. Moderate-sized hiatal hernia. Electronically Signed   By: Gaylyn Rong M.D.   On: 08/13/2022 16:10   DG Chest Port 1 View  Result Date: 08/13/2022 CLINICAL DATA:  Altered mental status.  Recent COVID pneumonia EXAM: PORTABLE CHEST 1 VIEW COMPARISON:  Chest radiograph 05/16/2022 FINDINGS: Stable cardiac and mediastinal contours. Rounded retrocardiac density. No pleural effusion or pneumothorax. Thoracic spine degenerative changes. IMPRESSION: There is a rounded retrocardiac density which is nonspecific and may represent the patient's hiatal hernia. Possibility of underlying pulmonary consolidation in the setting of infection not entirely excluded. Electronically Signed   By: Annia Belt M.D.   On: 08/13/2022 14:20   (Echo, Carotid, EGD, Colonoscopy, ERCP)    Subjective: No complaints  Discharge Exam: Vitals:   09/02/22 0350 09/02/22 0751  BP: (!) 172/81   Pulse: 92   Resp: 20   Temp: 98.3 F (36.8 C)   SpO2: 100% 95%   Vitals:   09/01/22 1356 09/01/22 2115 09/02/22 0350 09/02/22 0751  BP:  (!) 152/85 (!) 172/81   Pulse:  93 92   Resp:  20 20   Temp:  98.3 F (  36.8 C) 98.3 F (36.8 C)   TempSrc:  Oral Oral   SpO2: 94% 100% 100% 95%  Weight:   80.9 kg   Height:        General: Pt is alert, awake, not in acute distress Cardiovascular: RRR, S1/S2 +, no rubs, no gallops Respiratory: CTA bilaterally, no wheezing, no rhonchi Abdominal: Soft, NT, ND, bowel sounds + Extremities: no edema, no cyanosis    The results of significant diagnostics from this hospitalization (including imaging, microbiology, ancillary and laboratory) are listed below for reference.     Microbiology: No results found for this or any previous visit (from the past 240 hour(s)).    Labs: BNP (last 3 results) Recent Labs    05/08/22 2023 08/26/22 1437  BNP 19.7 131.5*   Basic Metabolic Panel: Recent Labs  Lab 08/26/22 1437 08/27/22 0341 08/28/22 0441 08/29/22 0600 08/30/22 0413 08/31/22 0159 09/01/22 0255  NA 144 144 145 145 147* 147* 146*  K 3.9 3.2* 3.0* 2.9* 2.8* 3.2* 3.1*  CL 118* 119* 118* 120* 117* 121* 117*  CO2 20* 20* 20* 21* 21* 21* 21*  GLUCOSE 129* 123* 106* 112* 128* 88 96  BUN 27* 24* 19 14 11  7* 7*  CREATININE 0.93 0.78 0.69 0.55 0.52 0.54 0.55  CALCIUM 7.7* 8.2* 8.0* 8.0* 8.0* 7.7* 8.0*  MG 2.1 2.0 1.9 2.2  --   --   --   PHOS 2.2* 2.4* 2.0* 2.1* 2.8 2.9 2.8   Liver Function Tests: Recent Labs  Lab 08/30/22 0413 08/31/22 0159 09/01/22 0255  ALBUMIN 1.7* 1.8* 1.6*   No results for input(s): "LIPASE", "AMYLASE" in the last 168 hours. No results for input(s): "AMMONIA" in the last 168 hours. CBC: Recent Labs  Lab 08/26/22 1437 08/27/22 0341  WBC 21.5* 17.9*  HGB 8.5* 8.7*  HCT 27.3* 27.9*  MCV 92.5 93.0  PLT 109* 128*   Cardiac Enzymes: No results for input(s): "CKTOTAL", "CKMB", "CKMBINDEX", "TROPONINI" in the last 168 hours. BNP: Invalid input(s): "POCBNP" CBG: Recent Labs  Lab 09/01/22 1607 09/01/22 2118 09/01/22 2358 09/02/22 0348 09/02/22 0731  GLUCAP 90 107* 93 88 85   D-Dimer No results for input(s): "DDIMER" in the last 72 hours. Hgb A1c No results for input(s): "HGBA1C" in the last 72 hours. Lipid Profile No results for input(s): "CHOL", "HDL", "LDLCALC", "TRIG", "CHOLHDL", "LDLDIRECT" in the last 72 hours. Thyroid function studies No results for input(s): "TSH", "T4TOTAL", "T3FREE", "THYROIDAB" in the last 72 hours.  Invalid input(s): "FREET3" Anemia work up No results for input(s): "VITAMINB12", "FOLATE", "FERRITIN", "TIBC", "IRON", "RETICCTPCT" in the last 72 hours. Urinalysis    Component Value Date/Time   COLORURINE YELLOW 08/14/2022 0457   APPEARANCEUR CLEAR 08/14/2022 0457   LABSPEC  1.026 08/14/2022 0457   PHURINE 7.0 08/14/2022 0457   GLUCOSEU NEGATIVE 08/14/2022 0457   HGBUR NEGATIVE 08/14/2022 0457   BILIRUBINUR NEGATIVE 08/14/2022 0457   KETONESUR NEGATIVE 08/14/2022 0457   PROTEINUR NEGATIVE 08/14/2022 0457   UROBILINOGEN 1.0 01/11/2010 1841   NITRITE NEGATIVE 08/14/2022 0457   LEUKOCYTESUR NEGATIVE 08/14/2022 0457   Sepsis Labs Recent Labs  Lab 08/26/22 1437 08/27/22 0341  WBC 21.5* 17.9*   Microbiology No results found for this or any previous visit (from the past 240 hour(s)).  SIGNED:   08/29/22, MD  [facility] 09/02/2022, 9:57 AM Pager   If 7PM-7AM, please contact night-coverage [email or web address] Password [email or web address]

## 2022-09-03 ENCOUNTER — Inpatient Hospital Stay (HOSPITAL_COMMUNITY): Payer: 59

## 2022-09-03 DIAGNOSIS — J9601 Acute respiratory failure with hypoxia: Secondary | ICD-10-CM | POA: Diagnosis not present

## 2022-09-03 DIAGNOSIS — R918 Other nonspecific abnormal finding of lung field: Secondary | ICD-10-CM | POA: Diagnosis not present

## 2022-09-03 DIAGNOSIS — R1310 Dysphagia, unspecified: Secondary | ICD-10-CM | POA: Diagnosis not present

## 2022-09-03 DIAGNOSIS — R29898 Other symptoms and signs involving the musculoskeletal system: Secondary | ICD-10-CM

## 2022-09-03 DIAGNOSIS — T17998A Other foreign object in respiratory tract, part unspecified causing other injury, initial encounter: Secondary | ICD-10-CM | POA: Diagnosis not present

## 2022-09-03 LAB — COMPREHENSIVE METABOLIC PANEL
ALT: 21 U/L (ref 0–44)
AST: 23 U/L (ref 15–41)
Albumin: 1.6 g/dL — ABNORMAL LOW (ref 3.5–5.0)
Alkaline Phosphatase: 78 U/L (ref 38–126)
Anion gap: 6 (ref 5–15)
BUN: 6 mg/dL — ABNORMAL LOW (ref 8–23)
CO2: 21 mmol/L — ABNORMAL LOW (ref 22–32)
Calcium: 8 mg/dL — ABNORMAL LOW (ref 8.9–10.3)
Chloride: 116 mmol/L — ABNORMAL HIGH (ref 98–111)
Creatinine, Ser: 0.54 mg/dL (ref 0.44–1.00)
GFR, Estimated: >60 mL/min (ref >60)
Glucose, Bld: 94 mg/dL (ref 70–99)
Potassium: 2.8 mmol/L — ABNORMAL LOW (ref 3.5–5.1)
Sodium: 143 mmol/L (ref 135–145)
Total Bilirubin: 0.3 mg/dL (ref 0.3–1.2)
Total Protein: 4.8 g/dL — ABNORMAL LOW (ref 6.5–8.1)

## 2022-09-03 LAB — CBC WITH DIFFERENTIAL/PLATELET
Abs Immature Granulocytes: 0.01 10*3/uL (ref 0.00–0.07)
Basophils Absolute: 0 10*3/uL (ref 0.0–0.1)
Basophils Relative: 0 %
Eosinophils Absolute: 0.2 10*3/uL (ref 0.0–0.5)
Eosinophils Relative: 4 %
HCT: 23.8 % — ABNORMAL LOW (ref 36.0–46.0)
Hemoglobin: 7.3 g/dL — ABNORMAL LOW (ref 12.0–15.0)
Immature Granulocytes: 0 %
Lymphocytes Relative: 18 %
Lymphs Abs: 1 10*3/uL (ref 0.7–4.0)
MCH: 28.3 pg (ref 26.0–34.0)
MCHC: 30.7 g/dL (ref 30.0–36.0)
MCV: 92.2 fL (ref 80.0–100.0)
Monocytes Absolute: 0.3 10*3/uL (ref 0.1–1.0)
Monocytes Relative: 7 %
Neutro Abs: 3.7 10*3/uL (ref 1.7–7.7)
Neutrophils Relative %: 71 %
Platelets: 253 10*3/uL (ref 150–400)
RBC: 2.58 MIL/uL — ABNORMAL LOW (ref 3.87–5.11)
RDW: 16.2 % — ABNORMAL HIGH (ref 11.5–15.5)
WBC: 5.2 10*3/uL (ref 4.0–10.5)
nRBC: 0 % (ref 0.0–0.2)

## 2022-09-03 LAB — GLUCOSE, CAPILLARY
Glucose-Capillary: 102 mg/dL — ABNORMAL HIGH (ref 70–99)
Glucose-Capillary: 115 mg/dL — ABNORMAL HIGH (ref 70–99)
Glucose-Capillary: 74 mg/dL (ref 70–99)
Glucose-Capillary: 79 mg/dL (ref 70–99)
Glucose-Capillary: 98 mg/dL (ref 70–99)
Glucose-Capillary: 98 mg/dL (ref 70–99)

## 2022-09-03 LAB — MAGNESIUM: Magnesium: 2 mg/dL (ref 1.7–2.4)

## 2022-09-03 LAB — PHOSPHORUS: Phosphorus: 2.9 mg/dL (ref 2.5–4.6)

## 2022-09-03 LAB — OCCULT BLOOD X 1 CARD TO LAB, STOOL: Fecal Occult Bld: NEGATIVE

## 2022-09-03 MED ORDER — FUROSEMIDE 10 MG/ML IJ SOLN
40.0000 mg | Freq: Once | INTRAMUSCULAR | Status: AC
  Administered 2022-09-03: 40 mg via INTRAVENOUS
  Filled 2022-09-03: qty 4

## 2022-09-03 MED ORDER — POTASSIUM CHLORIDE CRYS ER 20 MEQ PO TBCR
40.0000 meq | EXTENDED_RELEASE_TABLET | Freq: Once | ORAL | Status: AC
  Administered 2022-09-03: 40 meq via ORAL

## 2022-09-03 MED ORDER — POTASSIUM CHLORIDE IN NACL 20-0.9 MEQ/L-% IV SOLN
INTRAVENOUS | Status: DC
  Filled 2022-09-03 (×3): qty 1000

## 2022-09-03 MED ORDER — POTASSIUM CHLORIDE 10 MEQ/50ML IV SOLN
10.0000 meq | INTRAVENOUS | Status: DC
  Administered 2022-09-03 (×3): 10 meq via INTRAVENOUS
  Filled 2022-09-03 (×3): qty 50

## 2022-09-03 NOTE — Progress Notes (Signed)
[facility] PROGRESS NOTE    Progress Note  Stephanie Benton  HKV:[id number] DOB: 1951/01/11 DOA: 08/18/2022 PCP: Sharmon Revere, MD     Brief Narrative:   Stephanie Benton is an 72 y.o. female past medical history of COPD, bipolar disorder pancreatitis coming from SNF for increased dyspnea she was actually discharged from the hospital on 08/17/2021 during this hospital stay she was treated empirically with antibiotics and discharged on Omnicef on room air.  She returned the next day with chest opacification suggestive of mucous plugging requiring 3 to 5 L with a white count of 20,000 admitted to the ICU started empirically on antibiotics hypertonic saline repeated chest x-ray was unchanged PCCM was consulted status post FOB on 08/21/2022 with removal of thick secretions failed extubation postprocedural concern for GI bleed on 08/22/2022 with dark tarry stools  Significant Events: 1/15 presented with L hemithorax opacification and dyspnea, PCCM consult 1/16 more somnolent with Hgb drop,CXR without improvement in L-sided opacification, transfused 2 units PRBC for hemoglobin 5.5 --> 10 1/17 bronchoscopy with removal of thick mucous plugs from left mainstem and segmental bronchi, failed extubation postprocedure and transferred to ICU 1/18 Concern for GIB, Hgb drop 5.5, GI consulted. Remains on vent 1/20 antibiotics broadened to meropenem due to fevers and leukocytosis 1/21 Remains on vent, off pressors / precedex, more responsive 1/22 extubated    Assessment/Plan:   Acute respiratory failure with hypoxia requiring mechanical ventilation status post bronchoscopy to the left hemithorax due to atelectasis due to mucous plugging: Completed long course of antibiotics of IV Vanco and meropenem. CXR done that showed new re-opacification of left lung.  Concern about mucus plugging of left side again. Transferred to stepdown started hypertonic inhaled solution. Repeated chest x-ray is  pending. Further management per PCCM continue aggressive physiotherapy.  COVID-19 positive: She has completed her isolation on 08/23/2022.  Acute metabolic encephalopathy: Likely due to delirium continue melatonin at night.  Acute blood loss anemia: Status post 2 units of packed red blood cells due to black tarry stools on 08/22/2018. GI was consulted perform an EGD on 08/23/2022 showed gastritis. Hemoglobin this morning 7.3. Will recheck in the morning  Hypokalemia/hypophosphatemia: Continues to be low repeating orally and IV.  `Severe protein malnutrition/dysphagia: Off tube feedings.  Tolerating her diet  Goals of care/ethics: Palliative care to discuss with family goals of care.  Significant muscular deconditioning: PT OT evaluated the patient will need skilled nursing facility.  Sacral decubitus ulcer present on admission RN Pressure Injury Documentation: Pressure Injury 08/22/22 Buttocks Left;Medial;Mid Stage 2 -  Partial thickness loss of dermis presenting as a shallow open injury with a red, pink wound bed without slough. (Active)  08/22/22 0730  Location: Buttocks  Location Orientation: Left;Medial;Mid  Staging: Stage 2 -  Partial thickness loss of dermis presenting as a shallow open injury with a red, pink wound bed without slough.  Wound Description (Comments):   Present on Admission: Yes  Dressing Type Foam - Lift dressing to assess site every shift 09/02/22 1556     Pressure Injury 08/22/22 Buttocks Left;Medial;Mid;Upper Stage 2 -  Partial thickness loss of dermis presenting as a shallow open injury with a red, pink wound bed without slough. (Active)  08/22/22 0730  Location: Buttocks  Location Orientation: Left;Medial;Mid;Upper  Staging: Stage 2 -  Partial thickness loss of dermis presenting as a shallow open injury with a red, pink wound bed without slough.  Wound Description (Comments):   Present on Admission: Yes  Dressing Type Foam -  Lift dressing to assess  site every shift 09/02/22 1556   DVT prophylaxis: lovenox Family Communication:none Status is: Inpatient Remains inpatient appropriate because: Acute respiratory failure with hypoxia    Code Status:     Code Status Orders  (From admission, onward)           Start     Ordered   08/26/22 1532  Do not attempt resuscitation (DNR)  Continuous       Question Answer Comment  If patient has no pulse and is not breathing Do Not Attempt Resuscitation   If patient has a pulse and/or is breathing: Medical Treatment Goals LIMITED ADDITIONAL INTERVENTIONS: Use medication/IV fluids and cardiac monitoring as indicated; Do not use intubation or mechanical ventilation (DNI), also provide comfort medications.  Transfer to Progressive/Stepdown as indicated, avoid Intensive Care.   Consent: Discussion documented in EHR or advanced directives reviewed      08/26/22 1532           Code Status History     Date Active Date Inactive Code Status Order ID Comments User Context   08/18/2022 2146 08/26/2022 1532 DNR [id number]  Elwyn Reach, MD ED   08/13/2022 1932 08/18/2022 1704 DNR [id number]  Toy Baker, MD ED   07/20/2022 1729 07/22/2022 1925 DNR [id number]  Terrilee Croak, MD Inpatient   07/16/2022 0445 07/20/2022 1729 Full Code [id number]  Shela Leff, MD ED   07/08/2022 0840 07/13/2022 1806 Full Code [id number]  Reubin Milan, MD ED   06/21/2022 1753 06/25/2022 1934 Full Code [id number]  Reubin Milan, MD Inpatient   06/21/2022 1555 06/21/2022 1753 Full Code [id number]  Reubin Milan, MD ED   05/17/2022 0147 05/23/2022 1715 Full Code [id number]  Toy Baker, MD ED   02/17/2018 0446 02/18/2018 1413 Full Code [id number]  Vianne Bulls, MD Inpatient      Advance Directive Documentation    Flowsheet Row Most Recent Value  Type of Advance Directive Healthcare Power of Kathryn, Living will  Pre-existing out of facility DNR order (yellow form or pink MOST form)  --  "MOST" Form in Place? --         IV Access:   Peripheral IV   Procedures and diagnostic studies:   DG CHEST PORT 1 VIEW  Result Date: 09/02/2022 CLINICAL DATA:  Difficulty breathing.  Dyspnea. EXAM: PORTABLE CHEST 1 VIEW COMPARISON:  Radiographs 08/27/2022, 08/25/2022 and 08/23/2022. CT 08/13/2022. FINDINGS: 1201 hours. Left arm PICC projects to the superior cavoatrial junction. The heart size and mediastinal contours are stable. There is aortic atherosclerosis. There is progressive left lower lobe consolidation with progressive volume loss in the left hemithorax. The right lung remains clear. No evidence of pneumothorax or edema. There may be a small amount of pleural fluid on the left. IMPRESSION: Progressive left lower lobe consolidation with progressive volume loss in the left hemithorax. Findings remain most consistent with progressive left lower lobe pneumonia based on previous CT. Correlate clinically. Continued radiographic follow-up recommended. Electronically Signed   By: Richardean Sale M.D.   On: 09/02/2022 12:23     Medical Consultants:   None.   Subjective:    Lonie Peak relates her breathing is better this morning.  Objective:    Vitals:   09/03/22 0000 09/03/22 0046 09/03/22 0339 09/03/22 0407  BP: (!) 137/48     Pulse: 98     Resp: (!) 25     Temp:  (!) 97.5 F (36.4 C)  98 F (36.7 C)  TempSrc:  Oral  Oral  SpO2: 93%  98%   Weight:      Height:       SpO2: 98 % O2 Flow Rate (L/min): 1.5 L/min FiO2 (%): 30 %   Intake/Output Summary (Last 24 hours) at 09/03/2022 0719 Last data filed at 09/03/2022 0646 Gross per 24 hour  Intake 418 ml  Output 600 ml  Net -182 ml    Filed Weights   08/31/22 0500 09/02/22 0350 09/02/22 1556  Weight: 84.5 kg 80.9 kg 81.7 kg    Exam: General exam: In no acute distress. Respiratory system: Good air movement and clear to auscultation. Cardiovascular system: S1 & S2 heard, RRR. No JVD. Gastrointestinal  system: Abdomen is nondistended, soft and nontender.  Extremities: No pedal edema. Skin: Sacral decubitus ulcer stage II   Data Reviewed:    Labs: Basic Metabolic Panel: Recent Labs  Lab 08/28/22 0441 08/29/22 0600 08/30/22 0413 08/31/22 0159 09/01/22 0255 09/02/22 1703 09/03/22 0530  NA 145 145 147* 147* 146* 144 143  K 3.0* 2.9* 2.8* 3.2* 3.1* 2.8* 2.8*  CL 118* 120* 117* 121* 117* 116* 116*  CO2 20* 21* 21* 21* 21* 23 21*  GLUCOSE 106* 112* 128* 88 96 103* 94  BUN 19 14 11  7* 7* 5* 6*  CREATININE 0.69 0.55 0.52 0.54 0.55 0.44 0.54  CALCIUM 8.0* 8.0* 8.0* 7.7* 8.0* 8.0* 8.0*  MG 1.9 2.2  --   --   --   --  2.0  PHOS 2.0* 2.1* 2.8 2.9 2.8  --  2.9    GFR Estimated Creatinine Clearance: 67.1 mL/min (by C-G formula based on SCr of 0.54 mg/dL). Liver Function Tests: Recent Labs  Lab 08/30/22 0413 08/31/22 0159 09/01/22 0255 09/03/22 0530  AST  --   --   --  23  ALT  --   --   --  21  ALKPHOS  --   --   --  78  BILITOT  --   --   --  0.3  PROT  --   --   --  4.8*  ALBUMIN 1.7* 1.8* 1.6* 1.6*    No results for input(s): "LIPASE", "AMYLASE" in the last 168 hours. No results for input(s): "AMMONIA" in the last 168 hours. Coagulation profile No results for input(s): "INR", "PROTIME" in the last 168 hours. COVID-19 Labs  No results for input(s): "DDIMER", "FERRITIN", "LDH", "CRP" in the last 72 hours.  Lab Results  Component Value Date   SARSCOV2NAA POSITIVE (A) 08/13/2022   SARSCOV2NAA NEGATIVE 07/15/2022   [place] NEGATIVE 05/17/2022   [place] NEGATIVE 10/11/2019    CBC: Recent Labs  Lab 09/02/22 1703 09/03/22 0530  WBC 6.1 5.2  NEUTROABS  --  3.7  HGB 8.2* 7.3*  HCT 26.6* 23.8*  MCV 92.0 92.2  PLT 282 253    Cardiac Enzymes: No results for input(s): "CKTOTAL", "CKMB", "CKMBINDEX", "TROPONINI" in the last 168 hours. BNP (last 3 results) No results for input(s): "PROBNP" in the last 8760 hours. CBG: Recent Labs  Lab 09/02/22 0731  09/02/22 1143 09/02/22 1616 09/02/22 1945 09/03/22 0002  GLUCAP 85 94 91 123* 79    D-Dimer: No results for input(s): "DDIMER" in the last 72 hours. Hgb A1c: No results for input(s): "HGBA1C" in the last 72 hours. Lipid Profile: No results for input(s): "CHOL", "HDL", "LDLCALC", "TRIG", "CHOLHDL", "LDLDIRECT" in the last 72 hours.  Thyroid function studies: No results for input(s): "TSH", "T4TOTAL", "T3FREE", "THYROIDAB" in the last 72  hours.  Invalid input(s): "FREET3" Anemia work up: No results for input(s): "VITAMINB12", "FOLATE", "FERRITIN", "TIBC", "IRON", "RETICCTPCT" in the last 72 hours. Sepsis Labs: Recent Labs  Lab 09/02/22 1703 09/03/22 0530  WBC 6.1 5.2    Microbiology No results found for this or any previous visit (from the past 240 hour(s)).    Medications:    (feeding supplement) PROSource Plus  30 mL Oral BID BM   ascorbic acid  500 mg Oral Daily   carvedilol  12.5 mg Oral BID WC   Chlorhexidine Gluconate Cloth  6 each Topical Daily   dorzolamide  1 drop Both Eyes BID   DULoxetine  40 mg Oral Daily   feeding supplement  1 Container Oral TID BM   ferrous sulfate  300 mg Oral BID   fesoterodine  4 mg Oral Daily   folic acid  1 mg Oral Daily   ipratropium-albuterol  3 mL Nebulization BID   latanoprost  1 drop Both Eyes QHS   losartan  50 mg Oral Daily   multivitamin with minerals  1 tablet Oral Daily   nicotine  14 mg Transdermal Daily   pantoprazole  40 mg Oral BID   polyethylene glycol  17 g Oral Daily   potassium chloride  40 mEq Oral TID   sodium chloride HYPERTONIC  4 mL Nebulization Q4H   Vitamin D (Ergocalciferol)  50,000 Units Oral Q7 days   vitamin E  400 Units Oral Daily   Continuous Infusions:  sodium chloride 1,000 mL (08/31/22 1540)   sodium chloride Stopped (08/27/22 0957)   potassium chloride        LOS: 16 days   Charlynne Cousins  [facility]  09/03/2022, 7:19 AM

## 2022-09-03 NOTE — Progress Notes (Signed)
Bedside ultrasound done of the L lung with Dr. Carlis Abbott, no significant effusion seen amenable to thoracentesis.  Stephanie Carpen Delia Sitar, PA-C

## 2022-09-03 NOTE — Progress Notes (Signed)
NAME:  ODELL FASCHING, MRN:  [id number], DOB:  1951-01-14, LOS: 50 ADMISSION DATE:  08/18/2022, CONSULTATION DATE:  1/15 REFERRING MD:  Pokhrel, CHIEF COMPLAINT:  pneumonia  History of Present Illness:  72 y/o F with PMH significant for COPD, Bipolar Disorder, HTN, HL, pancreatitis who presented from SNF with increased dyspnea.  She was recently admitted with pneumonia and COVID-19 from 1/9-1/13, she was initially treated with aztreonam, azithromycin and vanc and discharged on omnicef and was on room air .   She returned to the ED on 1/14 with worsening dyspnea and CXR showed opacification of the L hemithorax suggestive of mucous plugging.  She required 2-3L oxygen and labs were significant for WBC of 20k, Hgb 7.3, lactic acid 2.4, procalcitonin 0.61.   She was admitted and started on Vancomycin and Cefepime along with hypertonic saline nebs, IS and chest PT.  A repeat CXR am 1/15 was unchanged, so PCCM consulted. S/p FOB on 1/17 with removal of thick secretions, failed extubation post procedure. Remained intubated in ICU. Concern for possible GIB 1/18 with significant Hgb drop, black stool.    Pertinent  Medical History   has a past medical history of Hyperlipidemia, Hypertension, and Prolonged QT interval (06/21/2022).   Significant Hospital Events: Including procedures, antibiotic start and stop dates in addition to other pertinent events   1/15 presented with L hemithorax opacification and dyspnea, PCCM consult 1/16 more somnolent with Hgb drop,CXR without improvement in L-sided opacification, transfused 2 units PRBC for hemoglobin 5.5 --> 10 1/17 bronchoscopy with removal of thick mucous plugs from left mainstem and segmental bronchi, failed extubation postprocedure and transferred to ICU 1/18 Concern for GIB, Hgb drop 5.5, GI consulted. Remains on vent 1/20 antibiotics broadened to meropenem due to fevers and leukocytosis 1/21 Remains on vent, off pressors / precedex, more responsive 1/22  extubated 1/24 room air, improving 1/25 transferred out of ICU 1/29 PCCM reconsult for worsening LLL consolidation 1/30 unable to NTS without pt vomiting, CXR with complete L-sided opacification  Interim History / Subjective:  O2 requirement stable and pt is not in distress however worsening L-sided opacification and collapse.    Objective   Blood pressure (!) 141/52, pulse 85, temperature 98 F (36.7 C), temperature source Oral, resp. rate (!) 21, height 5\' 5"  (1.651 m), weight 81.7 kg, SpO2 95 %.        Intake/Output Summary (Last 24 hours) at 09/03/2022 0916 Last data filed at 09/03/2022 1941 Gross per 24 hour  Intake 300 ml  Output 600 ml  Net -300 ml    Filed Weights   08/31/22 0500 09/02/22 0350 09/02/22 1556  Weight: 84.5 kg 80.9 kg 81.7 kg    General:  elderly F, well nourished, resting in bed in no distress HEENT: MM pink/moist, pupils equal  Neuro: awake and alert, answering questions, globally weak, follows commands and  CV: s1s2 rrr, no m/r/g PULM:  no air movement on the L side without significant wheezing or rhonchi, no distress on 2L [place] GI: soft, non-tender Extremities: warm/dry, no edema  Skin: no rashes or lesions    Resolved Hospital Problem list   Acute blood loss anemia Acute Metabolic Encephalopathy Hypokalemia  Hypophosphatemia Fluid overload Assessment & Plan:   Worsening LLL consolidation Initial Covid 19 infection in January 2024 Returned with L sided consolidation this admission, L hemithorax opacification/atelectasis due to mucous plugging, failed conservative treatment and eventually required intubation, mechanical ventilation  Bronch neg, completed vanc and meropenem,   Extubated 1/22 -improved  and plan was for discharge, back on 2L [place] and worsening CXR -1/30 CXR with complete opacification once again, pt unable to tolerate NTS without vomiting, has ineffective cough and is deconditioned.  Will repeat bedside US to ensure no effusion  amenable to thoracentesis.  Overall this is a marker for poor prognosis-DNR/DNI confirmed yesterday with son, will further discuss GOC -now DNR/DNI, so will try to manage conservatively without bronch as able, start hypertonic saline nebs and chest PT, NTS -continue supplemental O2 to maintain sats >92%    COVID + -airborne precautions now discontinued   HTN -cont. Cozaar and coreg   Muscular deconditioning PT/OT, likely will need to return to SNF -seen by PT, recommend short term rehab  Malnutrition (POA) Dysphagia -continue dysphagia diet  Hypokalemia -monitor and replete prn   Best Practice (right click and "Reselect all SmartList Selections" daily)  Diet/type: dysphagia 1 DVT prophylaxis: LMWH GI prophylaxis: PPI Lines: N/A Foley:  Yes, and it is still needed Code Status:  DNR Last date of multidisciplinary goals of care discussion: 1/29: DNR/DNI   Otilio Carpen Everlena Mackley, PA-C [facility] See Amion for pager If no response to pager , please call [phone] until 7pm After 7:00 pm call Elink  [phone]?[place]

## 2022-09-03 NOTE — Progress Notes (Signed)
[facility] ADULT ICU REPLACEMENT PROTOCOL   The patient does apply for the [facility] Adult ICU Electrolyte Replacment Protocol based on the criteria listed below:   1.Exclusion criteria: TCTS, ECMO, Dialysis, and Myasthenia Gravis patients 2. Is GFR >/= 30 ml/min? Yes.    Patient's GFR today is >60 3. Is SCr </= 2? Yes.   Patient's SCr is 0.54 mg/dL 4. Did SCr increase >/= 0.5 in 24 hours? No. 5.Pt's weight >40kg  Yes.   6. Abnormal electrolyte(s): potassium 2.8  7. Electrolytes replaced per protocol 8.  Call MD STAT for K+ </= 2.5, Phos </= 1, or Mag </= 1 Physician:  n/a  Darlys Gales 09/03/2022 6:28 AM

## 2022-09-03 NOTE — Progress Notes (Signed)
Nutrition Follow-up  DOCUMENTATION CODES:   Non-severe (moderate) malnutrition in context of chronic illness  INTERVENTION:  - DYS 1 diet per SLP.  - Boost Breeze po TID, each supplement provides 250 kcal and 9 grams of protein - Prosource Plus BID, each supplement provides 100 kcal and 15g of protein.    - Monitor weight trends.    - Patient noted to have several vitamin deficiencies: vitamin A, D, E, K, and C.              - Patient currently receiving: 50000 units vitamin D weekly, 500mg  vitamin C daily, 400 units vitamin E, and MVI - Patient needs vitamin A and K supplementation.  - Will defer vitamin K supplementation to CCM. - Vitamin A flags for fish allergy.          - Unfortunately there is not enough IV vitamin A for a full course to correct deficiency. Patient has fish allergy documented in chart but have not been able to verify true extent of allergy with patient. - Multivitamin tablet patient is receiving has 5000 units of vitamin A. Patient may need further repletion after discharge via IV if unable to obtain more information about fish allergy   NUTRITION DIAGNOSIS:   Moderate Malnutrition related to chronic illness (chronic pancreatitis, COPD) as evidenced by moderate fat depletion, moderate muscle depletion. *ongoing  GOAL:   Patient will meet greater than or equal to 90% of their needs *unmet  MONITOR:   Vent status, TF tolerance, Weight trends, Labs  REASON FOR ASSESSMENT:   Ventilator    ASSESSMENT:   72 y.o. female with medical history significant of HTN, HLD, bipolar disorder, AKI, chronic abdominal pain, chronic pancreatitis, esophagitis tobacco abuse, COPD, who was just discharged 1/13 to SNF after admission with severe sepsis as a result of CAP and COVID-19 infection but was brought back 1/14 for SOB and hypoxia.  1/14 Admit 1/17 bronchoscopy, failed extubation afterwards 1/21 Adult TF protocol started 1/22 patient extubated 1/23 SLP eval,  advanced to DYS 1 diet  Patient somewhat uninterested at time of visit. States she has no appetite so not eating very well. She is documented to be consuming an average of 40% of meals over the past 5 days. RN reports patient does not seem interested in eating and gets frustrated when offered food. Thankfully, RN reports patient loves [employer] and has been consuming. Has also been consuming ProSource Plus. Encouraged patient to continue drinking supplements to support intake.  Of note, weight fluctuates daily so true weight status difficult to assess.  Patient with vitamin A deficiency. She has not been able to receive vitamin A supplementation as there has not been enough IV for deficiency repletion and oral vitamin A flags for fish allergy. Patient has previously reported she does feel she has a fish allergy but unable to give much more information. Did not feel it was shellfish and states she eats tuna. Shares it may be "flounder and things like that". Unable to fully verify extent of allergy at this time.  She continues to receive other vitamin supplementation for various deficiencies.   Medications reviewed and include: 500mg  vitamin C, 300mg  ferrous sulfate, 1mg  folic acid, MVI, 31517 units vitamin D, 400 units vitamin E  Labs reviewed:  K+ 2.8   Diet Order:   Diet Order             Diet - low sodium heart healthy  DIET - DYS 1 Room service appropriate? Yes with Assist; Fluid consistency: Thin  Diet effective now                   EDUCATION NEEDS:  Not appropriate for education at this time  Skin:  Skin Assessment: Reviewed RN Assessment Skin Integrity Issues:: Stage II Stage II: L buttocks  Last BM:  1/29  Height:  Ht Readings from Last 1 Encounters:  09/02/22 5\' 5"  (1.651 m)   Weight:  Wt Readings from Last 1 Encounters:  09/02/22 81.7 kg    BMI:  Body mass index is 29.97 kg/m.  Estimated Nutritional Needs:  Kcal:  4580-9983 kcals Protein:   95-110 grams Fluid:  >/= 1.8L    Samson Frederic RD, LDN For contact information, refer to [facility].

## 2022-09-04 DIAGNOSIS — R5381 Other malaise: Secondary | ICD-10-CM

## 2022-09-04 DIAGNOSIS — T17908S Unspecified foreign body in respiratory tract, part unspecified causing other injury, sequela: Secondary | ICD-10-CM

## 2022-09-04 DIAGNOSIS — T17998A Other foreign object in respiratory tract, part unspecified causing other injury, initial encounter: Secondary | ICD-10-CM | POA: Diagnosis not present

## 2022-09-04 DIAGNOSIS — A419 Sepsis, unspecified organism: Secondary | ICD-10-CM | POA: Diagnosis not present

## 2022-09-04 DIAGNOSIS — T17908A Unspecified foreign body in respiratory tract, part unspecified causing other injury, initial encounter: Secondary | ICD-10-CM

## 2022-09-04 DIAGNOSIS — R0689 Other abnormalities of breathing: Secondary | ICD-10-CM

## 2022-09-04 DIAGNOSIS — R6520 Severe sepsis without septic shock: Secondary | ICD-10-CM | POA: Diagnosis not present

## 2022-09-04 DIAGNOSIS — J9601 Acute respiratory failure with hypoxia: Secondary | ICD-10-CM | POA: Diagnosis not present

## 2022-09-04 LAB — CBC WITH DIFFERENTIAL/PLATELET
Abs Immature Granulocytes: 0.02 10*3/uL (ref 0.00–0.07)
Basophils Absolute: 0.1 10*3/uL (ref 0.0–0.1)
Basophils Relative: 1 %
Eosinophils Absolute: 0.3 10*3/uL (ref 0.0–0.5)
Eosinophils Relative: 5 %
HCT: 24 % — ABNORMAL LOW (ref 36.0–46.0)
Hemoglobin: 7.2 g/dL — ABNORMAL LOW (ref 12.0–15.0)
Immature Granulocytes: 0 %
Lymphocytes Relative: 19 %
Lymphs Abs: 1 10*3/uL (ref 0.7–4.0)
MCH: 28.3 pg (ref 26.0–34.0)
MCHC: 30 g/dL (ref 30.0–36.0)
MCV: 94.5 fL (ref 80.0–100.0)
Monocytes Absolute: 0.3 10*3/uL (ref 0.1–1.0)
Monocytes Relative: 6 %
Neutro Abs: 3.7 10*3/uL (ref 1.7–7.7)
Neutrophils Relative %: 69 %
Platelets: 251 10*3/uL (ref 150–400)
RBC: 2.54 MIL/uL — ABNORMAL LOW (ref 3.87–5.11)
RDW: 16.3 % — ABNORMAL HIGH (ref 11.5–15.5)
WBC: 5.4 10*3/uL (ref 4.0–10.5)
nRBC: 0 % (ref 0.0–0.2)

## 2022-09-04 LAB — GLUCOSE, CAPILLARY
Glucose-Capillary: 86 mg/dL (ref 70–99)
Glucose-Capillary: 78 mg/dL (ref 70–99)
Glucose-Capillary: 80 mg/dL (ref 70–99)
Glucose-Capillary: 83 mg/dL (ref 70–99)
Glucose-Capillary: 78 mg/dL (ref 70–99)
Glucose-Capillary: 75 mg/dL (ref 70–99)

## 2022-09-04 LAB — BASIC METABOLIC PANEL
Anion gap: 2 — ABNORMAL LOW (ref 5–15)
BUN: 5 mg/dL — ABNORMAL LOW (ref 8–23)
CO2: 21 mmol/L — ABNORMAL LOW (ref 22–32)
Calcium: 7.1 mg/dL — ABNORMAL LOW (ref 8.9–10.3)
Chloride: 121 mmol/L — ABNORMAL HIGH (ref 98–111)
Creatinine, Ser: 0.52 mg/dL (ref 0.44–1.00)
GFR, Estimated: >60 mL/min (ref >60)
Glucose, Bld: 83 mg/dL (ref 70–99)
Potassium: 4.8 mmol/L (ref 3.5–5.1)
Sodium: 144 mmol/L (ref 135–145)

## 2022-09-04 LAB — PHOSPHORUS: Phosphorus: 2.8 mg/dL (ref 2.5–4.6)

## 2022-09-04 LAB — MAGNESIUM: Magnesium: 1.7 mg/dL (ref 1.7–2.4)

## 2022-09-04 MED ORDER — SENNOSIDES-DOCUSATE SODIUM 8.6-50 MG PO TABS: 1.0000 | ORAL_TABLET | Freq: Every evening | ORAL | Status: DC | PRN

## 2022-09-04 MED ORDER — METOPROLOL TARTRATE 5 MG/5ML IV SOLN: 5.0000 mg | INTRAVENOUS | Status: DC | PRN

## 2022-09-04 MED ORDER — TRAZODONE HCL 50 MG PO TABS
50.0000 mg | ORAL_TABLET | Freq: Every evening | ORAL | Status: DC | PRN
  Administered 2022-09-07 – 2022-09-11 (×3): 50 mg via ORAL
  Filled 2022-09-04 (×4): qty 1

## 2022-09-04 MED ORDER — ACETAMINOPHEN 325 MG PO TABS: 650.0000 mg | ORAL_TABLET | Freq: Four times a day (QID) | ORAL | Status: DC | PRN

## 2022-09-04 MED ORDER — IPRATROPIUM-ALBUTEROL 0.5-2.5 (3) MG/3ML IN SOLN
3.0000 mL | RESPIRATORY_TRACT | Status: DC | PRN
  Administered 2022-09-11: 3 mL via RESPIRATORY_TRACT
  Filled 2022-09-04: qty 3

## 2022-09-04 MED ORDER — GUAIFENESIN 100 MG/5ML PO LIQD
5.0000 mL | ORAL | Status: DC | PRN
  Administered 2022-09-04: 5 mL via ORAL
  Filled 2022-09-04: qty 10

## 2022-09-04 NOTE — TOC Progression Note (Signed)
Transition of Care [facility]) - Progression Note    Patient Details  Name: Stephanie Benton MRN: [id number] Date of Birth: November 22, 1950  Transition of Care [facility]) CM/SW [place], RN Phone Number: 09/04/2022, 3:12 PM  Clinical Narrative:   Per chart review patient currently in ICU, plan was to return to [facility] with palliative services with [facility] [name]). Patient's COVID isolation completed 08/23/22.  Patient's insurance Josem Kaufmann [id number] has expired, will need new auth. Per chart review potential for comfort care TOC will continue to follow for discharge needs.     Expected Discharge Plan: [place] Barriers to Discharge: Continued Medical Work up  Expected Discharge Plan and Services In-house Referral: NA Discharge Planning Services: CM Consult Post Acute Care Choice: [place] Living arrangements for the past 2 months: [place] (Will) Expected Discharge Date: 09/02/22               DME Arranged: N/A DME Agency: NA       HH Arranged: NA HH Agency: NA         Social Determinants of Health (SDOH) Interventions SDOH Screenings   Food Insecurity: No Food Insecurity (08/19/2022)  Housing: Low Risk  (08/19/2022)  Recent Concern: Housing - Medium Risk (06/21/2022)  Transportation Needs: No Transportation Needs (08/19/2022)  Recent Concern: Transportation Needs - Unmet Transportation Needs (06/21/2022)  Utilities: Not At Risk (08/19/2022)  Tobacco Use: High Risk (08/25/2022)    Readmission Risk Interventions    09/02/2022   11:01 AM 07/22/2022   11:58 AM 07/18/2022    8:43 AM  Readmission Risk Prevention Plan  Transportation Screening Complete  Complete  Medication Review (RN Care Manager) Complete  Complete  PCP or Specialist appointment within 3-5 days of discharge Complete  Complete  HRI or Home Care Consult Complete  Complete  SW Recovery Care/Counseling Consult Complete  Complete   Palliative Care Screening   Not Vander Complete Complete Not Applicable

## 2022-09-04 NOTE — Progress Notes (Signed)
Speech Language Pathology Treatment: Dysphagia  Patient Details Name: Stephanie Benton MRN: [id number] DOB: 1951-07-16 Today's Date: 09/04/2022 Time: 3235-5732 SLP Time Calculation (min) (ACUTE ONLY): 28 min  Assessment / Plan / Recommendation Clinical Impression  Patient seen for skilled SLP to address dysphagia goals. She is currently on oxygen and noted to have increased dyspnea with swallowing pills with thin liquid and applesauce. Swallow continues to be delayed and pt's voice is weak but her speech is intelligible. Pt declined to consume any of her breakfast or solids offered - stating "Why do you keep bothering me to drinks all that?" SLP educated her to concerns for her baseline "cramping" that may worsen with no other po intake besides her multiple medications. Left pt with chocolate Ensure for her to cnsume at her leisure *that's the only flavor she desired. During medication administration, pt noted to extend her neck to aid oral transiting with delayed multiple swallows. No overt indication of airway compromise. Eructation observed x1 along with delayed cough after completion of medication consumption - pt denies refluxing and SLP confirmed she is on a PPI. Will continue efforts to help pt advance diet as appropriate.     HPI HPI: 72 y/o F presented from SNF with dyspnea on 1/14. Bronchosopy with removal thick mucous plugs; Intubated 1/17-1/22.  PMH significant for COPD, Bipolar Disorder, HTN, HL, pancreatitis.  She was recently admitted with pneumonia and COVID-19 from 1/9-1/13. Evaluated by SLP on 1/10 with recs for dysphagia 1/thin liquids.  Pt underwent clinical bedside swallow evaluaition after extubation with recommendations for dys1/thin as well.  SlP has been following for readiness for dietary advancement, Intake has been poor.  Recent CXR 1/30 showed Complete opacification of the left hemithorax compatible with large  left effusion and left lung collapse/consolidation.     Pt has been  undergoing chest PT per RN.      SLP Plan  Continue with current plan of care      Recommendations for follow up therapy are one component of a multi-disciplinary discharge planning process, led by the attending physician.  Recommendations may be updated based on patient status, additional functional criteria and insurance authorization.    Recommendations  Diet recommendations: Dysphagia 1 (puree);Thin liquid Liquids provided via: Straw Medication Administration: Other (Comment) (small try whole with liquids, large try whole with nectar or crushed) Supervision: Full supervision/cueing for compensatory strategies Compensations: Slow rate;Small sips/bites;Other (Comment);Follow solids with liquid (check for pocketing on left) Postural Changes and/or Swallow Maneuvers: Seated upright 90 degrees;Upright 30-60 min after meal                General recommendations: Rehab consult Oral Care Recommendations: Oral care BID Follow Up Recommendations: No SLP follow up SLP Visit Diagnosis: Dysphagia, unspecified (R13.10) Plan: Continue with current plan of care         Kathleen Lime, MS [place]   Macario Golds  09/04/2022, 9:04 AM

## 2022-09-04 NOTE — Progress Notes (Signed)
NAME:  Stephanie Benton, MRN:  [id number], DOB:  September 27, 1950, LOS: 69 ADMISSION DATE:  08/18/2022, CONSULTATION DATE:  1/15 REFERRING MD:  Pokhrel, CHIEF COMPLAINT:  pneumonia  History of Present Illness:  72 y/o F with PMH significant for COPD, Bipolar Disorder, HTN, HL, pancreatitis who presented from SNF with increased dyspnea.  She was recently admitted with pneumonia and COVID-19 from 1/9-1/13, she was initially treated with aztreonam, azithromycin and vanc and discharged on omnicef and was on room air .   She returned to the ED on 1/14 with worsening dyspnea and CXR showed opacification of the L hemithorax suggestive of mucous plugging.  She required 2-3L oxygen and labs were significant for WBC of 20k, Hgb 7.3, lactic acid 2.4, procalcitonin 0.61.   She was admitted and started on Vancomycin and Cefepime along with hypertonic saline nebs, IS and chest PT.  A repeat CXR am 1/15 was unchanged, so PCCM consulted. S/p FOB on 1/17 with removal of thick secretions, failed extubation post procedure. Remained intubated in ICU. Concern for possible GIB 1/18 with significant Hgb drop, black stool.    Pertinent  Medical History   has a past medical history of Hyperlipidemia, Hypertension, and Prolonged QT interval (06/21/2022).   Significant Hospital Events: Including procedures, antibiotic start and stop dates in addition to other pertinent events   1/15 presented with L hemithorax opacification and dyspnea, PCCM consult 1/16 more somnolent with Hgb drop,CXR without improvement in L-sided opacification, transfused 2 units PRBC for hemoglobin 5.5 --> 10 1/17 bronchoscopy with removal of thick mucous plugs from left mainstem and segmental bronchi, failed extubation postprocedure and transferred to ICU 1/18 Concern for GIB, Hgb drop 5.5, GI consulted. Remains on vent 1/20 antibiotics broadened to meropenem due to fevers and leukocytosis 1/21 Remains on vent, off pressors / precedex, more responsive 1/22  extubated 1/24 room air, improving 1/25 transferred out of ICU 1/29 PCCM reconsult for worsening LLL consolidation 1/30 unable to NTS without pt vomiting, CXR with complete L-sided opacification  Interim History / Subjective:  O2 requirement stable and pt is not in distress however worsening L-sided opacification and collapse.    Objective   Blood pressure (!) 164/67, pulse 84, temperature 97.6 F (36.4 C), temperature source Oral, resp. rate 20, height 5\' 5"  (1.651 m), weight 81.7 kg, SpO2 98 %.    FiO2 (%):  [30 %] 30 %   Intake/Output Summary (Last 24 hours) at 09/04/2022 1230 Last data filed at 09/04/2022 1100 Gross per 24 hour  Intake 1773.13 ml  Output 2200 ml  Net -426.87 ml    Filed Weights   08/31/22 0500 09/02/22 0350 09/02/22 1556  Weight: 84.5 kg 80.9 kg 81.7 kg    General:  elderly F, well nourished, resting in bed in no distress HEENT: MM pink/moist, pupils equal  Neuro: awake and alert, answering questions, globally weak, follows commands and  CV: s1s2 rrr, no m/r/g PULM:  no air movement on the L side, no without significant wheezing or rhonchi, no distress on 2L [place], pocus yesterday without significant effusion GI: soft, non-tender Extremities: warm/dry, no edema  Skin: no rashes or lesions    Resolved Hospital Problem list   Acute blood loss anemia Acute Metabolic Encephalopathy Hypokalemia  Hypophosphatemia Fluid overload Assessment & Plan:   Worsening LLL consolidation Initial Covid 19 infection in January 2024 Returned with L sided consolidation this admission, L hemithorax opacification/atelectasis due to mucous plugging, failed conservative treatment and eventually required intubation, mechanical ventilation  Bronch  neg, completed vanc and meropenem,   Extubated 1/22 -improved and plan was for discharge, back on 2L [place] and worsening CXR -1/30 CXR with complete opacification once again, pt unable to tolerate NTS without vomiting, has ineffective  cough and is deconditioned.  Will repeat bedside US to ensure no effusion amenable to thoracentesis.  Overall this is a marker for poor prognosis-DNR/DNI confirmed yesterday with son, will further discuss [place].  Not sure son would want even short-term intubation for bronch and think this would be a short-term solution -L lung up during chest PT to encourage postural drainage -now DNR/DNI, so will try to manage conservatively without bronch as able, start hypertonic saline nebs and chest PT, NTS -continue supplemental O2 to maintain sats >92%    COVID + -airborne precautions now discontinued   HTN -cont. Cozaar and coreg   Muscular deconditioning PT/OT, likely will need to return to SNF -seen by PT, recommend short term rehab  Malnutrition (POA) Dysphagia -continue dysphagia diet  Hypokalemia -monitor and replete prn   Best Practice (right click and "Reselect all SmartList Selections" daily)  Diet/type: dysphagia 1 DVT prophylaxis: LMWH GI prophylaxis: PPI Lines: N/A Foley:  Yes, and it is still needed Code Status:  DNR Last date of multidisciplinary goals of care discussion: 1/29: DNR/DNI   Otilio Carpen Daymien Goth, PA-C [facility] See Amion for pager If no response to pager , please call [phone] until 7pm After 7:00 pm call Elink  [phone]?[place]

## 2022-09-04 NOTE — Progress Notes (Signed)
PROGRESS NOTE    Stephanie Benton  LNL:[id number] DOB: 1951-02-22 DOA: 08/18/2022 PCP: [name], MD   Brief Narrative:  72 year old with history of COPD, pancreatitis, bipolar admitted from SNF for increasing dyspnea.  Initially patient discharged on 1/13 after treated for COVID pneumonia and discharged on Omnicef.  Following day patient returned with hypoxia requiring 3-5 L nasal cannula with leukocytosis.  Initially admitted to the ICU on empiric antibiotics, hypertonic saline.  Patient was found to have left-sided hemithorax with opacification therefore PCCM was consulted.  There is also a drop in hemoglobin never required 2 units of PRBC transfusion.  Bronchoscopy performed on 1/17, removed thick mucous plug from the left mainstem and failed extubation requiring ICU transfer.  GI was also consulted due to drop in hemoglobin.  Antibiotics were broadened to 1/22 meropenem.  Eventually extubated.  Since then patient has had off-and-on worsening left lower lobe consolidation and again complete left-sided opacification on 1/30.  Significant Events: 1/15 presented with L hemithorax opacification and dyspnea, PCCM consult 1/16 more somnolent with Hgb drop,CXR without improvement in L-sided opacification, transfused 2 units PRBC for hemoglobin 5.5 --> 10 1/17 bronchoscopy with removal of thick mucous plugs from left mainstem and segmental bronchi, failed extubation postprocedure and transferred to ICU 1/18 Concern for GIB, Hgb drop 5.5, GI consulted. Remains on vent 1/20 antibiotics broadened to meropenem due to fevers and leukocytosis 1/21 Remains on vent, off pressors / precedex, more responsive 1/22 extubated   Assessment & Plan:  Principal Problem:   Severe sepsis with acute organ dysfunction ([place]) Active Problems:   Hypertension   Tobacco abuse counseling   Hypercholesterolemia   Bipolar disorder, in full remission, most recent episode mixed ([place])   LFTs abnormal   Hypokalemia    COPD (chronic obstructive pulmonary disease) (HCC)   Severe sepsis ([place])   Acute metabolic encephalopathy   HCAP (healthcare-associated pneumonia)   Tobacco abuse   Atelectasis of left lung   Acute hypoxic respiratory failure (HCC)   Left lower lobe pulmonary infiltrate   Mucus plug in respiratory tract   Acute encephalopathy   Pressure injury of skin   Dysphagia   Muscular deconditioning   Acute respiratory failure with hypoxia (HCC)    Acute respiratory failure with hypoxia requiring mechanical ventilation status post bronchoscopy to the left hemithorax due to atelectasis due to mucous plugging Recurrent aspirations Completed long course of antibiotics of IV Vanco and meropenem.  Repeat chest x-ray showed reopacification of the left lung with concerns of mucous plugging.  Transferred to stepdown unit.  PCCM following.   COVID-19 positive: She has completed her isolation on 08/23/2022.   Acute metabolic encephalopathy: Likely due to delirium continue melatonin at night.   Acute blood loss anemia: Status post 2 units of packed red blood cells due to black tarry stools on 08/22/2018.  GI performed endoscopy on 1/19 showing gastritis.  Currently on Protonix twice daily.  Iron supplements.   Hypokalemia/hypophosphatemia: Replete prn  Essential hypertension - Losartan and Coreg.  IV as needed.   `Severe protein malnutrition/dysphagia: Off tube feedings.  Tolerating her diet-dysphagia 1 diet   Goals of care/ethics: Palliative care to discuss with family goals of care.   Significant muscular deconditioning: PT OT evaluated the patient will need skilled nursing facility.   Unfortunately patient has very poor prognosis in the setting of recurrent aspiration.  Previously seen by palliative care.  Will rediscuss goals of care and consult palliative care   DVT prophylaxis: SCDs Start: 08/25/22 4081  Place and maintain sequential compression device Start: 08/20/22 0802 Code Status:  DNR Family Communication: Called patient's son Stephanie Benton, no answer  Status is: Inpatient Remains inpatient appropriate because: SOB with resp distress and very weak and frail.    Nutritional status    Signs/Symptoms: moderate fat depletion, moderate muscle depletion  Interventions: Tube feeding, Refer to RD note for recommendations  Body mass index is 29.97 kg/m.  Pressure Injury 08/22/22 Buttocks Left;Medial;Mid Stage 2 -  Partial thickness loss of dermis presenting as a shallow open injury with a red, pink wound bed without slough. (Active)  08/22/22 0730  Location: Buttocks  Location Orientation: Left;Medial;Mid  Staging: Stage 2 -  Partial thickness loss of dermis presenting as a shallow open injury with a red, pink wound bed without slough.  Wound Description (Comments):   Present on Admission: Yes     Pressure Injury 08/22/22 Buttocks Left;Medial;Mid;Upper Stage 2 -  Partial thickness loss of dermis presenting as a shallow open injury with a red, pink wound bed without slough. (Active)  08/22/22 0730  Location: Buttocks  Location Orientation: Left;Medial;Mid;Upper  Staging: Stage 2 -  Partial thickness loss of dermis presenting as a shallow open injury with a red, pink wound bed without slough.  Wound Description (Comments):   Present on Admission: Yes        Subjective: Patient seen and examined at bedside.  Answering all the basic questions appropriately.  She understands she is very weak and is at risk of recurrent aspiration.  I explained to her that she is at risk of recurrent respiratory failure   Examination:  General exam: Not in acute distress but generally very weak frail appearing, 4 L nasal cannula Respiratory system: Diminished breath sound bilaterally Cardiovascular system: S1 & S2 heard, RRR. No JVD, murmurs, rubs, gallops or clicks. No pedal edema. Gastrointestinal system: Abdomen is nondistended, soft and nontender. No organomegaly or masses felt.  Normal bowel sounds heard. Central nervous system: Alert and oriented. No focal neurological deficits. Extremities: Symmetric 3 x 5 power. Skin: No rashes, lesions or ulcers Psychiatry: Judgement and insight appear normal. Mood & affect appropriate.   Left upper extremity PICC line Foley catheter   Objective: Vitals:   09/03/22 2355 09/04/22 0000 09/04/22 0339 09/04/22 0400  BP:  (!) 135/57    Pulse:  90    Resp:  (!) 22    Temp:  98.5 F (36.9 C)  97.6 F (36.4 C)  TempSrc:  Axillary  Oral  SpO2: 97% 100% 95%   Weight:      Height:        Intake/Output Summary (Last 24 hours) at 09/04/2022 0726 Last data filed at 09/04/2022 0024 Gross per 24 hour  Intake 2311.75 ml  Output 1750 ml  Net 561.75 ml   Filed Weights   08/31/22 0500 09/02/22 0350 09/02/22 1556  Weight: 84.5 kg 80.9 kg 81.7 kg     Data Reviewed:   CBC: Recent Labs  Lab 09/02/22 1703 09/03/22 0530  WBC 6.1 5.2  NEUTROABS  --  3.7  HGB 8.2* 7.3*  HCT 26.6* 23.8*  MCV 92.0 92.2  PLT 282 528   Basic Metabolic Panel: Recent Labs  Lab 08/29/22 0600 08/30/22 0413 08/31/22 0159 09/01/22 0255 09/02/22 1703 09/03/22 0530  NA 145 147* 147* 146* 144 143  K 2.9* 2.8* 3.2* 3.1* 2.8* 2.8*  CL 120* 117* 121* 117* 116* 116*  CO2 21* 21* 21* 21* 23 21*  GLUCOSE 112* 128* 88 96 103*  94  BUN 14 11 7* 7* 5* 6*  CREATININE 0.55 0.52 0.54 0.55 0.44 0.54  CALCIUM 8.0* 8.0* 7.7* 8.0* 8.0* 8.0*  MG 2.2  --   --   --   --  2.0  PHOS 2.1* 2.8 2.9 2.8  --  2.9   GFR: Estimated Creatinine Clearance: 67.1 mL/min (by C-G formula based on SCr of 0.54 mg/dL). Liver Function Tests: Recent Labs  Lab 08/30/22 0413 08/31/22 0159 09/01/22 0255 09/03/22 0530  AST  --   --   --  23  ALT  --   --   --  21  ALKPHOS  --   --   --  78  BILITOT  --   --   --  0.3  PROT  --   --   --  4.8*  ALBUMIN 1.7* 1.8* 1.6* 1.6*   No results for input(s): "LIPASE", "AMYLASE" in the last 168 hours. No results for input(s):  "AMMONIA" in the last 168 hours. Coagulation Profile: No results for input(s): "INR", "PROTIME" in the last 168 hours. Cardiac Enzymes: No results for input(s): "CKTOTAL", "CKMB", "CKMBINDEX", "TROPONINI" in the last 168 hours. BNP (last 3 results) No results for input(s): "PROBNP" in the last 8760 hours. HbA1C: No results for input(s): "HGBA1C" in the last 72 hours. CBG: Recent Labs  Lab 09/03/22 1158 09/03/22 1529 09/03/22 1944 09/03/22 2353 09/04/22 0436  GLUCAP 98 98 102* 115* 86   Lipid Profile: No results for input(s): "CHOL", "HDL", "LDLCALC", "TRIG", "CHOLHDL", "LDLDIRECT" in the last 72 hours. Thyroid Function Tests: No results for input(s): "TSH", "T4TOTAL", "FREET4", "T3FREE", "THYROIDAB" in the last 72 hours. Anemia Panel: No results for input(s): "VITAMINB12", "FOLATE", "FERRITIN", "TIBC", "IRON", "RETICCTPCT" in the last 72 hours. Sepsis Labs: No results for input(s): "PROCALCITON", "LATICACIDVEN" in the last 168 hours.  No results found for this or any previous visit (from the past 240 hour(s)).       Radiology Studies: DG CHEST PORT 1 VIEW  Result Date: 09/03/2022 CLINICAL DATA:  Hypoxia EXAM: PORTABLE CHEST 1 VIEW COMPARISON:  09/02/2022 FINDINGS: Complete opacification of the left hemithorax compatible with large left effusion and left lung collapse/consolidation. Stable right lung aeration. The cardiac silhouette is obscured. Left extremity PICC line tip SVC RA junction. Postop changes in the epigastric region. IMPRESSION: Complete opacification of the left hemithorax compatible with large left effusion and left lung collapse/consolidation. Electronically Signed   By: Judie Petit.  Shick M.D.   On: 09/03/2022 08:08   DG CHEST PORT 1 VIEW  Result Date: 09/02/2022 CLINICAL DATA:  Difficulty breathing.  Dyspnea. EXAM: PORTABLE CHEST 1 VIEW COMPARISON:  Radiographs 08/27/2022, 08/25/2022 and 08/23/2022. CT 08/13/2022. FINDINGS: 1201 hours. Left arm PICC projects to the  superior cavoatrial junction. The heart size and mediastinal contours are stable. There is aortic atherosclerosis. There is progressive left lower lobe consolidation with progressive volume loss in the left hemithorax. The right lung remains clear. No evidence of pneumothorax or edema. There may be a small amount of pleural fluid on the left. IMPRESSION: Progressive left lower lobe consolidation with progressive volume loss in the left hemithorax. Findings remain most consistent with progressive left lower lobe pneumonia based on previous CT. Correlate clinically. Continued radiographic follow-up recommended. Electronically Signed   By: Carey Bullocks M.D.   On: 09/02/2022 12:23        Scheduled Meds:  (feeding supplement) PROSource Plus  30 mL Oral BID BM   ascorbic acid  500 mg Oral Daily  carvedilol  12.5 mg Oral BID WC   Chlorhexidine Gluconate Cloth  6 each Topical Daily   dorzolamide  1 drop Both Eyes BID   DULoxetine  40 mg Oral Daily   feeding supplement  1 Container Oral TID BM   ferrous sulfate  300 mg Oral BID   fesoterodine  4 mg Oral Daily   folic acid  1 mg Oral Daily   ipratropium-albuterol  3 mL Nebulization BID   latanoprost  1 drop Both Eyes QHS   losartan  50 mg Oral Daily   multivitamin with minerals  1 tablet Oral Daily   nicotine  14 mg Transdermal Daily   pantoprazole  40 mg Oral BID   polyethylene glycol  17 g Oral Daily   sodium chloride HYPERTONIC  4 mL Nebulization Q4H   Vitamin D (Ergocalciferol)  50,000 Units Oral Q7 days   vitamin E  400 Units Oral Daily   Continuous Infusions:  sodium chloride Stopped (08/27/22 0957)   0.9 % NaCl with KCl 20 mEq / L 75 mL/hr at 09/04/22 0024     LOS: 17 days   Time spent= 35 mins    Avarose Mervine Arsenio Loader, MD [facility]  If 7PM-7AM, please contact night-coverage  09/04/2022, 7:26 AM

## 2022-09-05 ENCOUNTER — Inpatient Hospital Stay (HOSPITAL_COMMUNITY): Payer: 59

## 2022-09-05 DIAGNOSIS — Z79899 Other long term (current) drug therapy: Secondary | ICD-10-CM

## 2022-09-05 DIAGNOSIS — Z515 Encounter for palliative care: Secondary | ICD-10-CM

## 2022-09-05 DIAGNOSIS — R52 Pain, unspecified: Secondary | ICD-10-CM

## 2022-09-05 DIAGNOSIS — T17998A Other foreign object in respiratory tract, part unspecified causing other injury, initial encounter: Secondary | ICD-10-CM | POA: Diagnosis not present

## 2022-09-05 DIAGNOSIS — A419 Sepsis, unspecified organism: Secondary | ICD-10-CM

## 2022-09-05 DIAGNOSIS — T17908S Unspecified foreign body in respiratory tract, part unspecified causing other injury, sequela: Secondary | ICD-10-CM | POA: Diagnosis not present

## 2022-09-05 DIAGNOSIS — R0600 Dyspnea, unspecified: Secondary | ICD-10-CM

## 2022-09-05 DIAGNOSIS — R5381 Other malaise: Secondary | ICD-10-CM

## 2022-09-05 DIAGNOSIS — J9601 Acute respiratory failure with hypoxia: Secondary | ICD-10-CM | POA: Diagnosis not present

## 2022-09-05 DIAGNOSIS — Z7189 Other specified counseling: Secondary | ICD-10-CM

## 2022-09-05 DIAGNOSIS — R4589 Other symptoms and signs involving emotional state: Secondary | ICD-10-CM

## 2022-09-05 DIAGNOSIS — R54 Age-related physical debility: Secondary | ICD-10-CM

## 2022-09-05 DIAGNOSIS — R6520 Severe sepsis without septic shock: Secondary | ICD-10-CM | POA: Diagnosis not present

## 2022-09-05 LAB — BASIC METABOLIC PANEL
Anion gap: 4 — ABNORMAL LOW (ref 5–15)
BUN: 5 mg/dL — ABNORMAL LOW (ref 8–23)
CO2: 23 mmol/L (ref 22–32)
Calcium: 8.1 mg/dL — ABNORMAL LOW (ref 8.9–10.3)
Chloride: 115 mmol/L — ABNORMAL HIGH (ref 98–111)
Creatinine, Ser: 0.55 mg/dL (ref 0.44–1.00)
GFR, Estimated: >60 mL/min (ref >60)
Glucose, Bld: 78 mg/dL (ref 70–99)
Potassium: 3.3 mmol/L — ABNORMAL LOW (ref 3.5–5.1)
Sodium: 142 mmol/L (ref 135–145)

## 2022-09-05 LAB — GLUCOSE, CAPILLARY
Glucose-Capillary: 83 mg/dL (ref 70–99)
Glucose-Capillary: 77 mg/dL (ref 70–99)
Glucose-Capillary: 80 mg/dL (ref 70–99)
Glucose-Capillary: 73 mg/dL (ref 70–99)

## 2022-09-05 LAB — MAGNESIUM: Magnesium: 1.9 mg/dL (ref 1.7–2.4)

## 2022-09-05 LAB — CBC
HCT: 24.1 % — ABNORMAL LOW (ref 36.0–46.0)
Hemoglobin: 7.3 g/dL — ABNORMAL LOW (ref 12.0–15.0)
MCH: 28.3 pg (ref 26.0–34.0)
MCHC: 30.3 g/dL (ref 30.0–36.0)
MCV: 93.4 fL (ref 80.0–100.0)
Platelets: 261 10*3/uL (ref 150–400)
RBC: 2.58 MIL/uL — ABNORMAL LOW (ref 3.87–5.11)
RDW: 15.9 % — ABNORMAL HIGH (ref 11.5–15.5)
WBC: 5.4 10*3/uL (ref 4.0–10.5)
nRBC: 0 % (ref 0.0–0.2)

## 2022-09-05 MED ORDER — HALOPERIDOL LACTATE 2 MG/ML PO CONC: 0.5000 mg | ORAL | Status: DC | PRN

## 2022-09-05 MED ORDER — POTASSIUM CHLORIDE 10 MEQ/100ML IV SOLN
10.0000 meq | INTRAVENOUS | Status: AC
  Administered 2022-09-05 (×2): 10 meq via INTRAVENOUS
  Filled 2022-09-05 (×2): qty 100

## 2022-09-05 MED ORDER — LORAZEPAM 1 MG PO TABS: 1.0000 mg | ORAL_TABLET | ORAL | Status: DC | PRN

## 2022-09-05 MED ORDER — GLYCOPYRROLATE 0.2 MG/ML IJ SOLN: 0.2000 mg | INTRAMUSCULAR | Status: DC | PRN

## 2022-09-05 MED ORDER — OXYCODONE HCL 20 MG/ML PO CONC: 5.0000 mg | ORAL | Status: DC | PRN

## 2022-09-05 MED ORDER — POLYVINYL ALCOHOL 1.4 % OP SOLN: 1.0000 [drp] | Freq: Four times a day (QID) | OPHTHALMIC | Status: DC | PRN

## 2022-09-05 MED ORDER — LORAZEPAM 2 MG/ML PO CONC: 1.0000 mg | ORAL | Status: DC | PRN

## 2022-09-05 MED ORDER — OXYCODONE HCL 5 MG PO TABS
5.0000 mg | ORAL_TABLET | ORAL | Status: DC | PRN
  Administered 2022-09-07: 5 mg via ORAL
  Filled 2022-09-05: qty 1

## 2022-09-05 MED ORDER — HALOPERIDOL LACTATE 5 MG/ML IJ SOLN: 0.5000 mg | INTRAMUSCULAR | Status: DC | PRN

## 2022-09-05 MED ORDER — HALOPERIDOL 0.5 MG PO TABS: 0.5000 mg | ORAL_TABLET | ORAL | Status: DC | PRN

## 2022-09-05 MED ORDER — BIOTENE DRY MOUTH MT LIQD: 15.0000 mL | OROMUCOSAL | Status: DC | PRN

## 2022-09-05 MED ORDER — HYDROMORPHONE HCL 1 MG/ML IJ SOLN
0.3000 mg | INTRAMUSCULAR | Status: DC | PRN
  Administered 2022-09-07 (×3): 0.3 mg via INTRAVENOUS
  Filled 2022-09-05 (×3): qty 0.5

## 2022-09-05 MED ORDER — GUAIFENESIN 100 MG/5ML PO LIQD
5.0000 mL | ORAL | Status: DC
  Administered 2022-09-05: 5 mL via ORAL
  Filled 2022-09-05: qty 10

## 2022-09-05 MED ORDER — GLYCOPYRROLATE 1 MG PO TABS: 1.0000 mg | ORAL_TABLET | ORAL | Status: DC | PRN

## 2022-09-05 NOTE — Progress Notes (Signed)
NAME:  Stephanie Benton, MRN:  [id number], DOB:  09-Oct-1950, LOS: 105 ADMISSION DATE:  08/18/2022, CONSULTATION DATE:  1/15 REFERRING MD:  Pokhrel, CHIEF COMPLAINT:  pneumonia  History of Present Illness:  72 y/o F with PMH significant for COPD, Bipolar Disorder, HTN, HL, pancreatitis who presented from SNF with increased dyspnea.  She was recently admitted with pneumonia and COVID-19 from 1/9-1/13, she was initially treated with aztreonam, azithromycin and vanc and discharged on omnicef and was on room air .   She returned to the ED on 1/14 with worsening dyspnea and CXR showed opacification of the L hemithorax suggestive of mucous plugging.  She required 2-3L oxygen and labs were significant for WBC of 20k, Hgb 7.3, lactic acid 2.4, procalcitonin 0.61.   She was admitted and started on Vancomycin and Cefepime along with hypertonic saline nebs, IS and chest PT.  A repeat CXR am 1/15 was unchanged, so PCCM consulted. S/p FOB on 1/17 with removal of thick secretions, failed extubation post procedure. Remained intubated in ICU. Concern for possible GIB 1/18 with significant Hgb drop, black stool.    Pertinent  Medical History   has a past medical history of Hyperlipidemia, Hypertension, and Prolonged QT interval (06/21/2022).   Significant Hospital Events: Including procedures, antibiotic start and stop dates in addition to other pertinent events   1/15 presented with L hemithorax opacification and dyspnea, PCCM consult 1/16 more somnolent with Hgb drop,CXR without improvement in L-sided opacification, transfused 2 units PRBC for hemoglobin 5.5 --> 10 1/17 bronchoscopy with removal of thick mucous plugs from left mainstem and segmental bronchi, failed extubation postprocedure and transferred to ICU 1/18 Concern for GIB, Hgb drop 5.5, GI consulted. Remains on vent 1/20 antibiotics broadened to meropenem due to fevers and leukocytosis 1/21 Remains on vent, off pressors / precedex, more responsive 1/22  extubated 1/24 room air, improving 1/25 transferred out of ICU 1/29 PCCM reconsult for worsening LLL consolidation 1/30 unable to NTS without pt vomiting, CXR with complete L-sided opacification  Interim History / Subjective:  O2 requirement unchanged, [place] 1.5L  Poor cough mechanics ongoing CXR > left side remains complete opacification left side   Objective   Blood pressure (!) 161/53, pulse 97, temperature 99.9 F (37.7 C), temperature source Axillary, resp. rate 14, height 5\' 5"  (1.651 m), weight 81.7 kg, SpO2 97 %.    FiO2 (%):  [30 %] 30 %   Intake/Output Summary (Last 24 hours) at 09/05/2022 0931 Last data filed at 09/05/2022 0600 Gross per 24 hour  Intake 724.61 ml  Output 1025 ml  Net -300.39 ml   Filed Weights   08/31/22 0500 09/02/22 0350 09/02/22 1556  Weight: 84.5 kg 80.9 kg 81.7 kg   General:  Elderly female sitting upright in bed in NAD HEENT: MM pink/moist, pupils 3/reactive Neuro: Awake, oriented to self otherwise confused, MAE/generalized- not able to lift to gravity CV: rr, NSR, no murmur PULM:  non labored, clear on R, very diminished on left, minimal movement, no rhonchi or wheeze.  Bedside US> no significant pleural effusion, [place] GI: obese, +bs, soft, mild tenderness in lower abd, foley Extremities: warm/dry, mild +1 generalized edema  Skin: no rashes, skin tear R hand  Tmax 99.9 Labs reviewed> WBC 5.4, H/H 7.3/ 24, K 3.3, sCr 0.55  Resolved Hospital Problem list   Acute blood loss anemia Acute Metabolic Encephalopathy Hypokalemia  Hypophosphatemia Fluid overload Assessment & Plan:   Acute hypoxic respiratory failure  Worsening LLL consolidation 2/2 mucous plugging Dysphagia -  Initial Covid 19 infection 1/24.  Returned with L sided consolidation this admission, L hemithorax opacification/atelectasis due to mucous plugging, failed conservative treatment and eventually required intubation, mechanical ventilation, Bronch neg, completed vanc and  meropenem,   Extubated 1/22 -improved and plan was for discharge, back on 2L [place] and worsening CXR -1/30 CXR with complete opacification once again, pt unable to tolerate NTS without vomiting, has ineffective cough and is deconditioned. Bedside US to ensure no effusion amenable to thoracentesis.  Overall this is a marker for poor prognosis-DNR/DNI confirmed 1/30 with son.  P:  - still no significant effusion noted on bedside US - CXR unchanged - diet per SLP> dysphagia 1, ongoing aspiration precautions  - aggressive pulm hygiene> IS, flutter, CPT, HTS completes 2/1 x 3 days, defer NTS (2/2 vomiting), duonebs  - cont L lung up for postural drainage  - cont DNI/ DNR.  Poor PO intake.  Given dysphagia, recurrent mucous plugging/ aspiration risk, functional decline, and progressive deconditioning, tends to be poor overall prognosis. May need cortrak.  Will discuss possible recurrent bronch again but this is likely to be ongoing and recurrent.  Consider ongoing GOC/ palliation if conservative measures fail to improve.   COVID + - airborne precautions now discontinued  HTN -cont. Cozaar and coreg  Muscular deconditioning PT/OT, likely will need to return to SNF - seen by PT, recommend short term rehab  Malnutrition (POA) Dysphagia -continue dysphagia diet/ SLP  Hypokalemia -monitor and replete prn  Remainder per Primary team.    Best Practice (right click and "Reselect all SmartList Selections" daily)  Diet/type: dysphagia 1 DVT prophylaxis: LMWH GI prophylaxis: PPI Lines: N/A Foley:  Yes, and it is still needed Code Status:  DNR Last date of multidisciplinary goals of care discussion: 1/29: DNR/DNI   Kennieth Rad, Ronne [facility]ning [facility] 09/05/2022, 9:48 AM  See Amion for pager If no response to pager, please call PCCM consult pager After 7:00 pm call Elink

## 2022-09-05 NOTE — Consult Note (Signed)
Consultation Note Date: 09/05/2022   Patient Name: Stephanie Benton  DOB: 1950-09-05  MRN: [id number]  Age / Sex: 72 y.o., female   PCP: Stephanie Revere, MD Referring Physician: Dimple Nanas, MD  Reason for Consultation: Establishing goals of care     Chief Complaint/History of Present Illness:   Patient is 72 year old female with a past medical history of COPD, bipolar disorder, hypertension, hyperlipidemia, and pancreatitis who was admitted on 08/18/2022 from rehab for management of increased dyspnea.  She had recently been hospitalized with pneumonia and COVID-19 from 1/9-1/13 and received appropriate medical care during that time.  During this prolonged hospitalization, has had imaging that has shown opacification of the left hemithorax suggestive of mucous plugging.  Patient has undergone multiple interventions to assist with management.  Patient also had concern for possible GI bleed during hospitalization.  Patient remains in the ICU for aggressive medical care.  Despite multiple interventions, patient's respiratory status is not improving and left lung still showing opacification on chest x-ray.  PCCM has been following to assist with management.  Palliative medicine consulted to assist with complex medical decision making. Of note patient seen by palliative medicine providers during prior hospitalization.  Extensive review of EMR prior to seeing patient.  Discussed care with bedside RN for updates.  Presenting to bedside, patient laying in bed.  No family present at bedside.  Introduced myself to patient.  Patient normally at baseline only oriented to herself.  Able to inquire about symptom management and patient feels her breathing is okay at this time.  Patient did not have any complaints of pain.  Patient is able to agree that she knows she is very sick.  Patient also agreed with me speaking to her son about her medical care.  Noted palliative medicine team will continue to follow  along to assist in her care.  ------------------------------------------------------------------------------------------------------------- Advance Care Planning Conversation  Pertinent diagnosis: COPD, bipolar disorder, aphasia, COVID, pneumonia  The patient and/or family consented to a voluntary Advance Care Planning Conversation. Individuals present for the conversation: Patient unable to participate in complex medical decision-making due to underlying medical status.  This palliative provider spoke with patient's listed next of kin/son, Stephanie Benton.  Summary of the conversation:  After speaking with patient, called patient's NOK/son, Stephanie Benton.  Introduced myself as a member of the palliative medicine team.  Stephanie Benton remembers speaking to palliative medicine providers previously.  Stephanie Benton updated me that he has heard most recently plan for possible procedure to continue to remove more mucus from patient's lung though he heard unsure if this will actually provide patient benefit.  Inquired about discussions of patient's overall medical prognosis and he has heard patient is not doing well.  He notes his priority as it has been previously is that patient not suffer and undergo unnecessary procedures if she is reaching the end of her life.  We discussed that patient is critically ill and that despite aggressive multiple interventions patient's respiratory status is not improving.  Patient's lungs continue to deteriorate and it would be appropriate to transition to comfort focused care at this time so that patient does not suffer at the end of her life.  Explained the idea of comfort focused care.  Discussed this would include discontinuing interventions that are no longer based on symptom management such as imaging, lab work, and IV fluids; would instead provide medications for pain, dyspnea, and anxiety.  Son agreeing with transitioning to comfort focused care at this time.  Outcome of  the conversations  and/or documents completed:  Transition to comfort focused care at this time.  I spent 27 minutes providing separately identifiable ACP services with the patient and/or surrogate decision maker in a voluntary, in-person conversation discussing the patient's wishes and goals as detailed in the above note.  Stephanie Aus, DO Palliative Care Provider  -------------------------------------------------------------------------------------------------------------  Did inquire about patient's status or to coming to the hospital.  Son noted that patient had been at rehab, was not a long-term care resident.  Noted would need continued discussions about where patient could best receive comfort focused care through hospice moving forward.  Son agreeing with continued discussions.  Son noted he was going to reach out to family members to inform of transitioning to comfort focused care.  Son for allowing me to speak with him today.  Son voiced appreciation for all the care patient has received.  Due to care team regarding transition to comfort focused care at this time.  Primary Diagnoses  Present on Admission:  Acute metabolic encephalopathy  Bipolar disorder, in full remission, most recent episode mixed (HCC)  COPD (chronic obstructive pulmonary disease) (HCC)  Hypercholesterolemia  Severe sepsis (HCC)  Tobacco abuse  LFTs abnormal  Hypokalemia  Hypertension  HCAP (healthcare-associated pneumonia)  Severe sepsis with acute organ dysfunction [facility])   Past Medical History:  Diagnosis Date   Hyperlipidemia    Hypertension    Prolonged QT interval 06/21/2022   Social History   Socioeconomic History   Marital status: Divorced    Spouse name: Not on file   Number of children: Not on file   Years of education: Not on file   Highest education level: Not on file  Occupational History   Not on file  Tobacco Use   Smoking status: Every Day    Packs/day: 0.75    Types: Cigarettes   Smokeless  tobacco: Never  Vaping Use   Vaping Use: Never used  Substance and Sexual Activity   Alcohol use: No   Drug use: No   Sexual activity: Not on file  Other Topics Concern   Not on file  Social History Narrative   Not on file   Social Determinants of Health   Financial Resource Strain: Not on file  Food Insecurity: No Food Insecurity (08/19/2022)   Hunger Vital Sign    Worried About Running Out of Food in the Last Year: Never true    Ran Out of Food in the Last Year: Never true  Transportation Needs: No Transportation Needs (08/19/2022)   PRAPARE - Hydrologist (Medical): No    Lack of Transportation (Non-Medical): No  Recent Concern: Transportation Needs - Unmet Transportation Needs (06/21/2022)   PRAPARE - Hydrologist (Medical): Yes    Lack of Transportation (Non-Medical): No  Physical Activity: Not on file  Stress: Not on file  Social Connections: Not on file   Family History  Problem Relation Age of Onset   Heart failure Mother    Cancer Father    Cancer Sister    Scheduled Meds:  (feeding supplement) PROSource Plus  30 mL Oral BID BM   ascorbic acid  500 mg Oral Daily   carvedilol  12.5 mg Oral BID WC   Chlorhexidine Gluconate Cloth  6 each Topical Daily   dorzolamide  1 drop Both Eyes BID   DULoxetine  40 mg Oral Daily   feeding supplement  1 Container Oral TID BM   ferrous  sulfate  300 mg Oral BID   fesoterodine  4 mg Oral Daily   folic acid  1 mg Oral Daily   ipratropium-albuterol  3 mL Nebulization BID   latanoprost  1 drop Both Eyes QHS   losartan  50 mg Oral Daily   multivitamin with minerals  1 tablet Oral Daily   nicotine  14 mg Transdermal Daily   pantoprazole  40 mg Oral BID   polyethylene glycol  17 g Oral Daily   sodium chloride HYPERTONIC  4 mL Nebulization Q4H   Vitamin D (Ergocalciferol)  50,000 Units Oral Q7 days   vitamin E  400 Units Oral Daily   Continuous Infusions:  sodium  chloride Stopped (08/27/22 0957)   potassium chloride 10 mEq (09/05/22 0841)   PRN Meds:.acetaminophen, docusate, guaiFENesin, hydrALAZINE, ipratropium-albuterol, loperamide HCl, metoprolol tartrate, ondansetron **OR** ondansetron (ZOFRAN) IV, mouth rinse, senna-docusate, traMADol, traZODone Allergies  Allergen Reactions   Fish Allergy Anaphylaxis   Morphine And Related Anaphylaxis and Swelling   Belsomra [Suvorexant] Other (See Comments)    Nightmares     Codeine Nausea And Vomiting   Duragesic-100 [Fentanyl] Hives   Motrin [Ibuprofen] Nausea Only and Other (See Comments)    History of stomach ulcers   Nsaids Other (See Comments)    History of stomach ulcers   Penicillins Hives    Did it involve swelling of the face/tongue/throat, SOB, or low BP? Yes Did it involve sudden or severe rash/hives, skin peeling, or any reaction on the inside of your mouth or nose? Yes Did you need to seek medical attention at a hospital or doctor's office? Yes When did it last happen? More than 10 years ago If all above answers are "NO", may proceed with cephalosporin use.    Sulfa Antibiotics Swelling    Not listed on MAR   Tolectin [Tolmetin] Other (See Comments)    History of stomach ulcers Not listed on MAR   CBC:    Component Value Date/Time   WBC 5.4 09/05/2022 0643   HGB 7.3 (L) 09/05/2022 0643   HCT 24.1 (L) 09/05/2022 0643   PLT 261 09/05/2022 0643   MCV 93.4 09/05/2022 0643   NEUTROABS 3.7 09/04/2022 0742   LYMPHSABS 1.0 09/04/2022 0742   MONOABS 0.3 09/04/2022 0742   EOSABS 0.3 09/04/2022 0742   BASOSABS 0.1 09/04/2022 0742   Comprehensive Metabolic Panel:    Component Value Date/Time   NA 142 09/05/2022 0643   K 3.3 (L) 09/05/2022 0643   CL 115 (H) 09/05/2022 0643   CO2 23 09/05/2022 0643   BUN 5 (L) 09/05/2022 0643   CREATININE 0.55 09/05/2022 0643   GLUCOSE 78 09/05/2022 0643   CALCIUM 8.1 (L) 09/05/2022 0643   AST 23 09/03/2022 0530   ALT 21 09/03/2022 0530    ALKPHOS 78 09/03/2022 0530   BILITOT 0.3 09/03/2022 0530   PROT 4.8 (L) 09/03/2022 0530   ALBUMIN 1.6 (L) 09/03/2022 0530    Physical Exam: Vital Signs: BP (!) 161/53   Pulse 97   Temp 99.9 F (37.7 C) (Axillary)   Resp 14   Ht 5\' 5"  (1.651 m)   Wt 81.7 kg   SpO2 97%   BMI 29.97 kg/m  SpO2: SpO2: 97 % O2 Device: O2 Device: Nasal Cannula O2 Flow Rate: O2 Flow Rate (L/min): 1.5 L/min Intake/output summary:  Intake/Output Summary (Last 24 hours) at 09/05/2022 0855 Last data filed at 09/05/2022 0600 Gross per 24 hour  Intake 724.61 ml  Output 1025 ml  Net -300.39 ml   LBM: Last BM Date : 09/03/22 Baseline Weight: Weight: 73.9 kg Most recent weight: Weight: 81.7 kg  General: NAD, awake, laying in bed, pleasantly oriented to self though otherwise confused, chronically ill-appearing Eyes: No drainage noted HENT: dry mucous membranes Cardiovascular: RRR Respiratory: no increased work of breathing noted, not in respiratory distress Abdomen: Soft Extremities: Weakness in all extremities Skin: no rashes or lesions on visible skin Neuro: Oriented to self (baseline)          Palliative Performance Scale: 20%               Additional Data Reviewed: Recent Labs    09/04/22 0742 09/05/22 0643  WBC 5.4 5.4  HGB 7.2* 7.3*  PLT 251 261  NA 144 142  BUN 5* 5*  CREATININE 0.52 0.55    Imaging: DG CHEST PORT 1 VIEW CLINICAL DATA:  Respiratory insufficiency and effusion  EXAM: PORTABLE CHEST 1 VIEW  COMPARISON:  09/03/2022  FINDINGS: Left-sided PICC line tip at high right atrium or superior caval/atrial junction. Patient rotated left. Mild cardiomegaly. Persistent white out of the left hemithorax. No right-sided pleural effusion. No pneumothorax. The right lung is clear.  IMPRESSION: Persistent left hemithorax opacification, likely secondary to a combination of large left pleural effusion and underlying left lung collapse and/or consolidation.  Electronically  Signed   By: Abigail Miyamoto M.D.   On: 09/05/2022 08:09    I personally reviewed recent imaging.   Palliative Care Assessment and Plan Summary of Established Goals of Care and Medical Treatment Preferences   Patient is 72 year old female with a past medical history of COPD, bipolar disorder, hypertension, hyperlipidemia, and pancreatitis who was admitted on 08/18/2022 from rehab for management of increased dyspnea.  She had recently been hospitalized with pneumonia and COVID-19 from 1/9-1/13 and received appropriate medical care during that time.  During this prolonged hospitalization, has had imaging that has shown opacification of the left hemithorax suggestive of mucous plugging.  Patient has undergone multiple interventions to assist with management.  Patient also had concern for possible GI bleed during hospitalization.  Patient remains in the ICU for aggressive medical care.  Despite multiple interventions, patient's respiratory status is not improving and left lung still showing opacification on chest x-ray.  PCCM has been following to assist with management.  Palliative medicine consulted to assist with complex medical decision making. Of note patient seen by palliative medicine providers during prior hospitalization.  # Complex medical decision making/goals of care  # Complex medical decision making/goals of care  -Patient unable to participate in medical decision making secondary to mental status.  -Spoke with patient's NOK/son, Talbert Nan, as described above in detail in HPI.  Transitioning to comfort focused care at this time as priority as per son is for patient not to suffer at the end of her life.  -At this time we will discontinue interventions that are no longer focused on comfort such as IV fluids, imaging, or lab work.  Will instead focus on symptom management of pain, dyspnea, and agitation in the setting of end-of-life care.  - Code Status: DNR   # Symptom management     -Pain/Dyspnea, acute on chronic in the setting of end-of-life care                Patient was not on medications for pain previously.                               -  Started IV Dilaudid 0.3 mg IV every 1 hour as needed.  Continue to adjust based on patient's symptom burden.  If patient needing frequent dosing, may need to consider continuous infusion.   -Also added prn oxycodone if patient able to take oral medication.                  -Anxiety/agitation, in the setting of end-of-life care                               -Started po/SL/IV Ativan 1 mg every 4 hours as needed. Continue to adjust based on patient's symptom burden.                                 -And also has po/SL/IV Haldol 0.5 mg every 4 hours as needed. Continue to adjust based on patient's symptom burden.                   -Secretions, in the setting of end-of-life care                               -Started glycopyrrolate every 4 hours as needed.  # Psycho-social/Spiritual Support:  - Support System: son, grandson, daughter-in-law, sister  # Discharge Planning:  TBD.  Addition to comfort focused care at this time.  Thank you for allowing the palliative care team to participate in the care [facility].  Stephanie Aus, DO Palliative Care Provider PMT # [phone]  If patient remains symptomatic despite maximum doses, please call PMT at [phone] between 0700 and 1900. Outside of these hours, please call attending, as PMT does not have night coverage.

## 2022-09-05 NOTE — Evaluation (Addendum)
Occupational Therapy Evaluation Patient Details Name: Stephanie Benton MRN: [id number] DOB: 1950-08-10 Today's Date: 09/05/2022   History of Present Illness Pt is 72 y/o F admitted on 08/18/22 from SNF with dyspnea.  She was recently admitted with pneumonia and COVID-19 from 1/9-1/13, she was initially treated with aztreonam, azithromycin and vanc and discharged on omnicef and was on room air . She returned to the ED on 1/14 with worsening dyspnea and CXR showed opacification of the L hemithorax suggestive of mucous plugging. S/p FOB on 1/17 with removal of thick secretions, failed extubation post procedure. Remained intubated in ICU. Extubated 08/26/22.Concern for possible GIB 1/18 with significant Hgb drop, black stool. Pt with PMH significant for COPD, Bipolar Disorder, HTN, HL, pancreatitis .   Clinical Impression   Stephanie Benton is a 72 year old woman who presents with decreased ROM and strength in all extremities, generalzed weakness, poor activity tolerance and increased edema resulting in poor functional performance. On evaluation she was able to wash her lower face but otherwise total assist for ADLs at bed level and total assist for be mobility. Her bilateral upper extremities are very edematous and she has poor and limited ROM bilaterally. She was able to grossly rub lotion on her thighs in one spot bilaterally. She became somewhat agitated with activity at end of evaluation. Patient will benefit from skilled OT services while in hospital to improve deficits and learn compensatory strategies as needed in order to improve functional abilities and reduce caregiver burden.       Recommendations for follow up therapy are one component of a multi-disciplinary discharge planning process, led by the attending physician.  Recommendations may be updated based on patient status, additional functional criteria and insurance authorization.   Follow Up Recommendations  Skilled nursing-short term rehab  (<3 hours/day)     Assistance Recommended at Discharge Frequent or constant Supervision/Assistance  Patient can return home with the following Two people to help with walking and/or transfers;A lot of help with bathing/dressing/bathroom;Assistance with feeding    Functional Status Assessment  Patient has had a recent decline in their functional status and demonstrates the ability to make significant improvements in function in a reasonable and predictable amount of time.  Equipment Recommendations  None recommended by OT    Recommendations for Other Services       Precautions / Restrictions Precautions Precautions: Fall Precaution Comments: monitor VS, Restrictions Weight Bearing Restrictions: No      Mobility Bed Mobility Overal bed mobility: Needs Assistance Bed Mobility: Rolling Rolling: Total assist, +2 for physical assistance              Transfers                          Balance                                           ADL either performed or assessed with clinical judgement   ADL Overall ADL's : Needs assistance/impaired Eating/Feeding: Total assistance;NPO;Bed level   Grooming: Bed level;Maximal assistance;Wash/dry face Grooming Details (indicate cue type and reason): able to wash bottom of face wiith right hand Upper Body Bathing: Total assistance;Bed level   Lower Body Bathing: Total assistance;Bed level   Upper Body Dressing : Total assistance;Bed level   Lower Body Dressing: Total assistance;Bed level   Toilet Transfer:  Total assistance   Toileting- Clothing Manipulation and Hygiene: Total assistance;Bed level               Vision   Vision Assessment?: No apparent visual deficits     Perception     Praxis      Pertinent Vitals/Pain Pain Assessment Pain Assessment: Faces Faces Pain Scale: Hurts little more Pain Location: abdomen Pain Descriptors / Indicators: Grimacing, Burning Pain  Intervention(s): Monitored during session     Hand Dominance Right   Extremity/Trunk Assessment Upper Extremity Assessment Upper Extremity Assessment: RUE deficits/detail;LUE deficits/detail RUE Deficits / Details: very edematous, minimal finger flexion, required active assist for elbow ROM and PROM for shoulder - can grossly rub lotion on thigh, wound on dorsum of hand RUE Coordination: decreased fine motor;decreased gross motor LUE Deficits / Details: very edematous, minimal finger flexion, required active assist for elbow ROM and PROm for shoulder LUE Coordination: decreased fine motor;decreased gross motor   Lower Extremity Assessment Lower Extremity Assessment: Defer to PT evaluation       Communication Communication Communication: Expressive difficulties   Cognition   Behavior During Therapy: WFL for tasks assessed/performed Overall Cognitive Status: Impaired/Different from baseline                                 General Comments: Alert to self and  -- not to date, President or situation     General Comments       Exercises     Shoulder Instructions      Home Living Family/patient expects to be discharged to:: Private residence Living Arrangements: Alone Available Help at Discharge: Family;Available PRN/intermittently Type of Home: Apartment Home Access: Level entry     Home Layout: One level     Bathroom Shower/Tub: Teacher, early years/pre: Standard     Home Equipment: Advice worker (2 wheels);BSC/3in1   Additional Comments: From [employer] apartments per chart, pt unable to provide current info      Prior Functioning/Environment Prior Level of Function : Patient poor historian/Family not available;Needs assist             Mobility Comments: use of hurrycane for mobility inside & RW when outside- info from previous encounter -- but max assist recently hospitilizations ADLs Comments: unable  to provide PLOF for ADL, assume fairly independent if living alone - prior to recent admission. max-total assist recently        OT Problem List: Decreased strength;Impaired balance (sitting and/or standing);Decreased activity tolerance;Decreased cognition;Decreased safety awareness;Decreased coordination;Impaired vision/perception;Decreased range of motion;Cardiopulmonary status limiting activity;Pain;Impaired UE functional use;Increased edema      OT Treatment/Interventions: Self-care/ADL training;Therapeutic exercise;Energy conservation;DME and/or AE instruction;Therapeutic activities;Patient/family education    OT Goals(Current goals can be found in the care plan section) Acute Rehab OT Goals OT Goal Formulation: Patient unable to participate in goal setting Time For Goal Achievement: 09/19/22 Potential to Achieve Goals: Fair  OT Frequency: Min 2X/week    Co-evaluation PT/OT/SLP Co-Evaluation/Treatment: Yes     OT goals addressed during session: Strengthening/ROM      AM-PAC OT "6 Clicks" Daily Activity     Outcome Measure Help from another person eating meals?: Total   Help from another person toileting, which includes using toliet, bedpan, or urinal?: Total Help from another person bathing (including washing, rinsing, drying)?: Total Help from another person to put on and taking off regular upper body clothing?: Total Help from another  person to put on and taking off regular lower body clothing?: Total 6 Click Score: 5   End of Session Nurse Communication: Mobility status  Activity Tolerance: Patient limited by fatigue Patient left: in bed;with call bell/phone within reach  OT Visit Diagnosis: Muscle weakness (generalized) (M62.81);Other symptoms and signs involving cognitive function                Time: 1105-1136 OT Time Calculation (min): 31 min Charges:  OT General Charges $OT Visit: 1 Visit OT Evaluation $OT Eval Low Complexity: 1 Low  Gustavo Lah,  OTR/L [place]  Office [phone]   Lenward Chancellor 09/05/2022, 1:19 PM

## 2022-09-05 NOTE — Progress Notes (Signed)
PROGRESS NOTE    Stephanie Benton  MEQ:[id number] DOB: [birth year] DOA: 08/18/2022 PCP: [name], MD   Brief Narrative:  72 year old with history of COPD, pancreatitis, bipolar admitted from SNF for increasing dyspnea.  Initially patient discharged on 1/13 after treated for COVID pneumonia and discharged on Omnicef.  Following day patient returned with hypoxia requiring 3-5 L nasal cannula with leukocytosis.  Initially admitted to the ICU on empiric antibiotics, hypertonic saline.  Patient was found to have left-sided hemithorax with opacification therefore PCCM was consulted.  There is also a drop in hemoglobin never required 2 units of PRBC transfusion.  Bronchoscopy performed on 1/17, removed thick mucous plug from the left mainstem and failed extubation requiring ICU transfer.  GI was also consulted due to drop in hemoglobin.  Antibiotics were broadened to 1/22 meropenem.  Eventually extubated.  Since then patient has had off-and-on worsening left lower lobe consolidation and again complete left-sided opacification on 1/30.  Significant Events: 1/15 presented with L hemithorax opacification and dyspnea, PCCM consult 1/16 more somnolent with Hgb drop,CXR without improvement in L-sided opacification, transfused 2 units PRBC for hemoglobin 5.5 --> 10 1/17 bronchoscopy with removal of thick mucous plugs from left mainstem and segmental bronchi, failed extubation postprocedure and transferred to ICU 1/18 Concern for GIB, Hgb drop 5.5, GI consulted. Remains on vent 1/20 antibiotics broadened to meropenem due to fevers and leukocytosis 1/21 Remains on vent, off pressors / precedex, more responsive 1/22 extubated 2/1-transition to comfort care   Assessment & Plan:  Principal Problem:   Severe sepsis with acute organ dysfunction ([place]) Active Problems:   Hypertension   Tobacco abuse counseling   Hypercholesterolemia   Bipolar disorder, in full remission, most recent episode mixed ([place])    LFTs abnormal   Hypokalemia   COPD (chronic obstructive pulmonary disease) ([facility])   Severe sepsis ([place])   Acute metabolic encephalopathy   HCAP (healthcare-associated pneumonia)   Tobacco abuse   Atelectasis of left lung   Acute hypoxic respiratory failure (H[facility])   Left lower lobe pulmonary infiltrate   Mucus plug in respiratory tract   Acute encephalopathy   Pressure injury of skin   Dysphagia   Muscular deconditioning   Acute respiratory failure with hypoxia ([facility])   Respiratory insufficiency   Debility   Aspiration into airway    Acute respiratory failure with hypoxia requiring mechanical ventilation status post bronchoscopy to the left hemithorax due to atelectasis due to mucous plugging Recurrent aspirations Completed long course of antibiotics of IV Vanco and meropenem.  Repeat chest x-ray showed reopacification of the left lung with concerns of mucous plugging.  Patient is now comfort care   COVID-19 positive: She has completed her isolation on 08/23/2022.   Acute metabolic encephalopathy: Supportive care   Acute blood loss anemia: Status post 2 units of packed red blood cells due to black tarry stools on 08/22/2018.  GI performed endoscopy on 1/19 showing gastritis.  Currently on Protonix twice daily.  Iron supplements.  May require further transfusion if hemoglobin drops below 7   Hypokalemia/hypophosphatemia: Replete prn  Essential hypertension - Stop home, patient is now comfort care   `Severe protein malnutrition/dysphagia: Off tube feedings.  Tolerating her diet-dysphagia 1 diet but we can make this more liberal as tolerated   Goals of care/ethics: Palliative care to discuss with family goals of care.   Significant muscular deconditioning: PT OT evaluated the patient will need skilled nursing facility.   Appreciate input from palliative care.  Patient has been  now transitioned to comfort care   DVT prophylaxis: SCDs Start: 08/25/22 8101 Place and maintain  sequential compression device Start: 08/20/22 0802 Code Status: DNR Family Communication:   Status is: Inpatient Remains inpatient appropriate because: SOB with resp distress and very weak and frail.    Nutritional status    Signs/Symptoms: moderate fat depletion, moderate muscle depletion  Interventions: Tube feeding, Refer to RD note for recommendations  Body mass index is 29.97 kg/m.  Pressure Injury 08/22/22 Buttocks Left;Medial;Mid Stage 2 -  Partial thickness loss of dermis presenting as a shallow open injury with a red, pink wound bed without slough. (Active)  08/22/22 0730  Location: Buttocks  Location Orientation: Left;Medial;Mid  Staging: Stage 2 -  Partial thickness loss of dermis presenting as a shallow open injury with a red, pink wound bed without slough.  Wound Description (Comments):   Present on Admission: Yes     Pressure Injury 08/22/22 Buttocks Left;Medial;Mid;Upper Stage 2 -  Partial thickness loss of dermis presenting as a shallow open injury with a red, pink wound bed without slough. (Active)  08/22/22 0730  Location: Buttocks  Location Orientation: Left;Medial;Mid;Upper  Staging: Stage 2 -  Partial thickness loss of dermis presenting as a shallow open injury with a red, pink wound bed without slough.  Wound Description (Comments):   Present on Admission: Yes        Subjective: Patient seen and examined at bedside, no complaints.   Examination:  Constitutional: Appears chronically ill l and frail Respiratory: Bilateral diminished breath sounds Cardiovascular: Normal sinus rhythm, no rubs Abdomen: Nontender nondistended good bowel sounds Musculoskeletal: Bilateral upper and lower extremity edema Skin: No rashes seen Neurologic: CN 2-12 grossly intact.  And nonfocal Psychiatric: Poor judgment and insight.  Alert to name and place  Left upper extremity PICC line Foley catheter   Objective: Vitals:   09/05/22 0400 09/05/22 0500 09/05/22  0600 09/05/22 0749  BP: (!) 138/53 (!) 146/65 (!) 161/53   Pulse: 90 95 93 97  Resp: 17 18 (!) 25 14  Temp: 99.9 F (37.7 C)     TempSrc: Axillary     SpO2: 95% 92% 92% 97%  Weight:      Height:        Intake/Output Summary (Last 24 hours) at 09/05/2022 0751 Last data filed at 09/05/2022 0600 Gross per 24 hour  Intake 724.61 ml  Output 1025 ml  Net -300.39 ml   Filed Weights   08/31/22 0500 09/02/22 0350 09/02/22 1556  Weight: 84.5 kg 80.9 kg 81.7 kg     Data Reviewed:   CBC: Recent Labs  Lab 09/02/22 1703 09/03/22 0530 09/04/22 0742 09/05/22 0643  WBC 6.1 5.2 5.4 5.4  NEUTROABS  --  3.7 3.7  --   HGB 8.2* 7.3* 7.2* 7.3*  HCT 26.6* 23.8* 24.0* 24.1*  MCV 92.0 92.2 94.5 93.4  PLT 282 253 251 751   Basic Metabolic Panel: Recent Labs  Lab 08/30/22 0413 08/31/22 0159 09/01/22 0255 09/02/22 1703 09/03/22 0530 09/04/22 0742 09/05/22 0643  NA 147* 147* 146* 144 143 144 142  K 2.8* 3.2* 3.1* 2.8* 2.8* 4.8 3.3*  CL 117* 121* 117* 116* 116* 121* 115*  CO2 21* 21* 21* 23 21* 21* 23  GLUCOSE 128* 88 96 103* 94 83 78  BUN 11 7* 7* 5* 6* 5* 5*  CREATININE 0.52 0.54 0.55 0.44 0.54 0.52 0.55  CALCIUM 8.0* 7.7* 8.0* 8.0* 8.0* 7.1* 8.1*  MG  --   --   --   --  2.0 1.7 1.9  PHOS 2.8 2.9 2.8  --  2.9 2.8  --    GFR: Estimated Creatinine Clearance: 67.1 mL/min (by C-G formula based on SCr of 0.55 mg/dL). Liver Function Tests: Recent Labs  Lab 08/30/22 0413 08/31/22 0159 09/01/22 0255 09/03/22 0530  AST  --   --   --  23  ALT  --   --   --  21  ALKPHOS  --   --   --  78  BILITOT  --   --   --  0.3  PROT  --   --   --  4.8*  ALBUMIN 1.7* 1.8* 1.6* 1.6*   No results for input(s): "LIPASE", "AMYLASE" in the last 168 hours. No results for input(s): "AMMONIA" in the last 168 hours. Coagulation Profile: No results for input(s): "INR", "PROTIME" in the last 168 hours. Cardiac Enzymes: No results for input(s): "CKTOTAL", "CKMB", "CKMBINDEX", "TROPONINI" in the last  168 hours. BNP (last 3 results) No results for input(s): "PROBNP" in the last 8760 hours. HbA1C: No results for input(s): "HGBA1C" in the last 72 hours. CBG: Recent Labs  Lab 09/04/22 1250 09/04/22 1544 09/04/22 1941 09/04/22 2301 09/05/22 0408  GLUCAP 80 83 78 75 83   Lipid Profile: No results for input(s): "CHOL", "HDL", "LDLCALC", "TRIG", "CHOLHDL", "LDLDIRECT" in the last 72 hours. Thyroid Function Tests: No results for input(s): "TSH", "T4TOTAL", "FREET4", "T3FREE", "THYROIDAB" in the last 72 hours. Anemia Panel: No results for input(s): "VITAMINB12", "FOLATE", "FERRITIN", "TIBC", "IRON", "RETICCTPCT" in the last 72 hours. Sepsis Labs: No results for input(s): "PROCALCITON", "LATICACIDVEN" in the last 168 hours.  No results found for this or any previous visit (from the past 240 hour(s)).       Radiology Studies: No results found.      Scheduled Meds:  (feeding supplement) PROSource Plus  30 mL Oral BID BM   ascorbic acid  500 mg Oral Daily   carvedilol  12.5 mg Oral BID WC   Chlorhexidine Gluconate Cloth  6 each Topical Daily   dorzolamide  1 drop Both Eyes BID   DULoxetine  40 mg Oral Daily   feeding supplement  1 Container Oral TID BM   ferrous sulfate  300 mg Oral BID   fesoterodine  4 mg Oral Daily   folic acid  1 mg Oral Daily   ipratropium-albuterol  3 mL Nebulization BID   latanoprost  1 drop Both Eyes QHS   losartan  50 mg Oral Daily   multivitamin with minerals  1 tablet Oral Daily   nicotine  14 mg Transdermal Daily   pantoprazole  40 mg Oral BID   polyethylene glycol  17 g Oral Daily   sodium chloride HYPERTONIC  4 mL Nebulization Q4H   Vitamin D (Ergocalciferol)  50,000 Units Oral Q7 days   vitamin E  400 Units Oral Daily   Continuous Infusions:  sodium chloride Stopped (08/27/22 0957)     LOS: 18 days   Time spent= 35 mins    Diamon Reddinger Arsenio Loader, MD [facility]  If 7PM-7AM, please contact night-coverage  09/05/2022, 7:51  AM

## 2022-09-05 NOTE — Progress Notes (Signed)
Physical Therapy Treatment Patient Details Name: Stephanie Benton MRN: [id number] DOB: 16-Mar-1951 Today's Date: 09/05/2022   History of Present Illness Pt is 72 y/o F admitted on 08/18/22 from SNF with dyspnea.  She was recently admitted with pneumonia and COVID-19 from 1/9-1/13, she was initially treated with aztreonam, azithromycin and vanc and discharged on omnicef and was on room air . She returned to the ED on 1/14 with worsening dyspnea and CXR showed opacification of the L hemithorax suggestive of mucous plugging. S/p FOB on 1/17 with removal of thick secretions, failed extubation post procedure. Remained intubated in ICU. Extubated 08/26/22.Concern for possible GIB 1/18 with significant Hgb drop, black stool. Pt with PMH significant for COPD, Bipolar Disorder, HTN, HL, pancreatitis .    PT Comments    Patient alert and speaking, answers some questions, follows  some simple directions. PT to sign off as patient  is now on comfort care measures.    Recommendations for follow up therapy are one component of a multi-disciplinary discharge planning process, led by the attending physician.  Recommendations may be updated based on patient status, additional functional criteria and insurance authorization.  Follow Up Recommendations  Long-term institutional care without follow-up therapy Can patient physically be transported by private vehicle: No   Assistance Recommended at Discharge Frequent or constant Supervision/Assistance  Patient can return home with the following Two people to help with walking and/or transfers;Two people to help with bathing/dressing/bathroom;Assistance with cooking/housework;Direct supervision/assist for medications management;Direct supervision/assist for financial management;Assist for transportation;Help with stairs or ramp for entrance   Equipment Recommendations  None recommended by PT    Recommendations for Other Services       Precautions / Restrictions  Precautions Precautions: Fall Precaution Comments: monitor VS, Restrictions Weight Bearing Restrictions: No     Mobility  Bed Mobility               General bed mobility comments: plaed in bed chair position, Patient listing to the left  after multiple times to  return to midline, legs migrate to the left    Transfers                   General transfer comment: unable to attempt    Ambulation/Gait                   Stairs             Wheelchair Mobility    Modified Rankin (Stroke Patients Only)       Balance                                            Cognition Arousal/Alertness: Awake/alert Behavior During Therapy: WFL for tasks assessed/performed Overall Cognitive Status: No family/caregiver present to determine baseline cognitive functioning Area of Impairment: Orientation, Attention                 Orientation Level: Situation, Time Current Attention Level: Focused       Awareness: Intellectual Problem Solving: Slow processing, Decreased initiation General Comments: Alert to self and [name] -- not to date, President or situation        Exercises General Exercises - Lower Extremity Long Arc Quad: AROM, Both, 5 reps, Seated Heel Slides: AAROM, Both, 10 reps, Supine    General Comments        Pertinent Vitals/Pain Pain Assessment  Faces Pain Scale: Hurts little more Pain Descriptors / Indicators: Grimacing, Burning Pain Intervention(s): Monitored during session    Home Living Family/patient expects to be discharged to:: Private residence Living Arrangements: Alone Available Help at Discharge: Family;Available PRN/intermittently Type of Home: Apartment Home Access: Level entry       Home Layout: One level Home Equipment: Advice worker (2 wheels);BSC/3in1 Additional Comments: From [place] apartments per chart, pt unable to provide current info    Prior Function             PT Goals (current goals can now be found in the care plan section) Progress towards PT goals: Not progressing toward goals - comment    Frequency           PT Plan  (PT discontinued. patient moved to comfort care measures)    Co-evaluation PT/OT/SLP Co-Evaluation/Treatment: Yes Reason for Co-Treatment: For patient/therapist safety PT goals addressed during session: Mobility/safety with mobility;Strengthening/ROM OT goals addressed during session: Strengthening/ROM      AM-PAC PT "6 Clicks" Mobility   Outcome Measure  Help needed turning from your back to your side while in a flat bed without using bedrails?: Total Help needed moving from lying on your back to sitting on the side of a flat bed without using bedrails?: Total Help needed moving to and from a bed to a chair (including a wheelchair)?: Total Help needed standing up from a chair using your arms (e.g., wheelchair or bedside chair)?: Total Help needed to walk in hospital room?: Total Help needed climbing 3-5 steps with a railing? : Total 6 Click Score: 6    End of Session Equipment Utilized During Treatment: Oxygen Activity Tolerance: Patient limited by fatigue Patient left: in bed;with call bell/phone within reach;with bed alarm set Nurse Communication: Mobility status;Need for lift equipment PT Visit Diagnosis: Muscle weakness (generalized) (M62.81);Other abnormalities of gait and mobility (R26.89);Hemiplegia and hemiparesis     Time: 1110-1135 PT Time Calculation (min) (ACUTE ONLY): 25 min  Charges:  $Therapeutic Activity: 8-22 mins                     [place] Office [phone] Weekend [phone]    Stephanie Benton 09/05/2022, 2:51 PM

## 2022-09-05 NOTE — Progress Notes (Signed)
Chaplain received a referral to [place] and family. Stephanie Benton was alert and able to request prayer.  Chaplain provided prayer and listening as she shared about her family who visited that day.  Chaplain will continue to follow up, but please page [name] as needs arise.  [address] [place] Pager, [phone]

## 2022-09-06 DIAGNOSIS — Z7189 Other specified counseling: Secondary | ICD-10-CM

## 2022-09-06 DIAGNOSIS — J449 Chronic obstructive pulmonary disease, unspecified: Secondary | ICD-10-CM | POA: Diagnosis not present

## 2022-09-06 DIAGNOSIS — T17908S Unspecified foreign body in respiratory tract, part unspecified causing other injury, sequela: Secondary | ICD-10-CM | POA: Diagnosis not present

## 2022-09-06 DIAGNOSIS — F3178 Bipolar disorder, in full remission, most recent episode mixed: Secondary | ICD-10-CM | POA: Diagnosis not present

## 2022-09-06 DIAGNOSIS — Z515 Encounter for palliative care: Secondary | ICD-10-CM | POA: Diagnosis not present

## 2022-09-06 DIAGNOSIS — R6520 Severe sepsis without septic shock: Secondary | ICD-10-CM | POA: Diagnosis not present

## 2022-09-06 DIAGNOSIS — A419 Sepsis, unspecified organism: Secondary | ICD-10-CM | POA: Diagnosis not present

## 2022-09-06 NOTE — Progress Notes (Signed)
PROGRESS NOTE    Stephanie Benton  PNT:[id number] DOB: 1951-06-13 DOA: 08/18/2022 PCP: [name], MD   Brief Narrative:  72 year old with history of COPD, pancreatitis, bipolar admitted from SNF for increasing dyspnea.  Initially patient discharged on 1/13 after treated for COVID pneumonia and discharged on Omnicef.  Following day patient returned with hypoxia requiring 3-5 L nasal cannula with leukocytosis.  Initially admitted to the ICU on empiric antibiotics, hypertonic saline.  Patient was found to have left-sided hemithorax with opacification therefore PCCM was consulted.  There is also a drop in hemoglobin never required 2 units of PRBC transfusion.  Bronchoscopy performed on 1/17, removed thick mucous plug from the left mainstem and failed extubation requiring ICU transfer.  GI was also consulted due to drop in hemoglobin.  Antibiotics were broadened to 1/22 meropenem.  Eventually extubated.  Since then patient has had off-and-on worsening left lower lobe consolidation and again complete left-sided opacification on 1/30.  Significant Events: 1/15 presented with L hemithorax opacification and dyspnea, PCCM consult 1/16 more somnolent with Hgb drop,CXR without improvement in L-sided opacification, transfused 2 units PRBC for hemoglobin 5.5 --> 10 1/17 bronchoscopy with removal of thick mucous plugs from left mainstem and segmental bronchi, failed extubation postprocedure and transferred to ICU 1/18 Concern for GIB, Hgb drop 5.5, GI consulted. Remains on vent 1/20 antibiotics broadened to meropenem due to fevers and leukocytosis 1/21 Remains on vent, off pressors / precedex, more responsive 1/22 extubated 2/1-transition to comfort care   Assessment & Plan:  Principal Problem:   Severe sepsis with acute organ dysfunction ([place]) Active Problems:   Hypertension   Tobacco abuse counseling   Hypercholesterolemia   Bipolar disorder, in full remission, most recent episode mixed ([place])    LFTs abnormal   Hypokalemia   COPD (chronic obstructive pulmonary disease) (HCC)   Severe sepsis ([place])   Acute metabolic encephalopathy   HCAP (healthcare-associated pneumonia)   Tobacco abuse   Atelectasis of left lung   Acute hypoxic respiratory failure (HCC)   Left lower lobe pulmonary infiltrate   Mucus plug in respiratory tract   Acute encephalopathy   Pressure injury of skin   Dysphagia   Muscular deconditioning   Acute respiratory failure with hypoxia (HCC)   Respiratory insufficiency   Debility   Aspiration into airway   Palliative care encounter   Goals of care, counseling/discussion   Need for emotional support   High risk medication use   Pain   Dyspnea   Sepsis with acute organ dysfunction (HCC)   Frailty   Physical deconditioning    Acute respiratory failure with hypoxia requiring mechanical ventilation status post bronchoscopy to the left hemithorax due to atelectasis due to mucous plugging Recurrent aspirations Completed long course of antibiotics of IV Vanco and meropenem.  Repeat chest x-ray showed reopacification of the left lung with concerns of mucous plugging.  Patient is now comfort care   COVID-19 positive: She has completed her isolation on 08/23/2022.   Acute metabolic encephalopathy: Supportive care   Acute blood loss anemia: Status post 2 units of packed red blood cells due to black tarry stools on 08/22/2018.  GI performed endoscopy on 1/19 showing gastritis.  Currently on Protonix twice daily.  Iron supplements.  May require further transfusion if hemoglobin drops below 7   Hypokalemia/hypophosphatemia: Replete prn  Essential hypertension - Stop home, patient is now comfort care   Severe protein malnutrition/dysphagia: Off tube feedings.  Tolerating her diet-dysphagia 1 diet but we can make this  more liberal as tolerated   Goals of care/ethics: Palliative care to discuss with family goals of care.   Significant muscular  deconditioning: PT OT evaluated the patient will need skilled nursing facility.   Appreciate input from palliative care.  Patient has been now transitioned to comfort care   DVT prophylaxis:  Code Status: DNR Family Communication: Spoke with son [name]  Status is: Inpatient Remains inpatient appropriate because: Difficult disposition as patient does not qualify for inpatient hospice and unable to go home as her son lives out of town.  TOC consulted. Nutritional status    Signs/Symptoms: moderate fat depletion, moderate muscle depletion  Interventions: Tube feeding, Refer to RD note for recommendations  Body mass index is 29.97 kg/m.  Pressure Injury 08/22/22 Buttocks Left;Medial;Mid Stage 2 -  Partial thickness loss of dermis presenting as a shallow open injury with a red, pink wound bed without slough. (Active)  08/22/22 0730  Location: Buttocks  Location Orientation: Left;Medial;Mid  Staging: Stage 2 -  Partial thickness loss of dermis presenting as a shallow open injury with a red, pink wound bed without slough.  Wound Description (Comments):   Present on Admission: Yes     Pressure Injury 08/22/22 Buttocks Left;Medial;Mid;Upper Stage 2 -  Partial thickness loss of dermis presenting as a shallow open injury with a red, pink wound bed without slough. (Active)  08/22/22 0730  Location: Buttocks  Location Orientation: Left;Medial;Mid;Upper  Staging: Stage 2 -  Partial thickness loss of dermis presenting as a shallow open injury with a red, pink wound bed without slough.  Wound Description (Comments):   Present on Admission: Yes        Subjective: Seen and examined at bedside.  Patient does have any complaints this morning. Examination:  Constitutional: Appears chronically ill and frail. Respiratory: Bilateral diminished breath sounds Cardiovascular: Normal sinus rhythm, no rubs Abdomen: Nontender nondistended good bowel sounds Musculoskeletal: Bilateral upper and  lower extremity edema Skin: No rashes seen Neurologic: CN 2-12 grossly intact.  And nonfocal Psychiatric: Poor judgment and insight.  Alert to name and place  Left upper extremity PICC line Foley catheter   Objective: Vitals:   09/05/22 1200 09/05/22 1300 09/05/22 1400 09/05/22 1500  BP: (!) 138/116 (!) 152/67 (!) 152/54 (!) 159/64  Pulse: 88 89 91 95  Resp: 18 18 17  (!) 23  Temp:      TempSrc:      SpO2: 98% 98% 92% 91%  Weight:      Height:        Intake/Output Summary (Last 24 hours) at 09/06/2022 1309 Last data filed at 09/06/2022 1141 Gross per 24 hour  Intake 260 ml  Output 400 ml  Net -140 ml   Filed Weights   08/31/22 0500 09/02/22 0350 09/02/22 1556  Weight: 84.5 kg 80.9 kg 81.7 kg     Data Reviewed:   CBC: Recent Labs  Lab 09/02/22 1703 09/03/22 0530 09/04/22 0742 09/05/22 0643  WBC 6.1 5.2 5.4 5.4  NEUTROABS  --  3.7 3.7  --   HGB 8.2* 7.3* 7.2* 7.3*  HCT 26.6* 23.8* 24.0* 24.1*  MCV 92.0 92.2 94.5 93.4  PLT 282 253 251 716   Basic Metabolic Panel: Recent Labs  Lab 08/31/22 0159 09/01/22 0255 09/02/22 1703 09/03/22 0530 09/04/22 0742 09/05/22 0643  NA 147* 146* 144 143 144 142  K 3.2* 3.1* 2.8* 2.8* 4.8 3.3*  CL 121* 117* 116* 116* 121* 115*  CO2 21* 21* 23 21* 21* 23  GLUCOSE  88 96 103* 94 83 78  BUN 7* 7* 5* 6* 5* 5*  CREATININE 0.54 0.55 0.44 0.54 0.52 0.55  CALCIUM 7.7* 8.0* 8.0* 8.0* 7.1* 8.1*  MG  --   --   --  2.0 1.7 1.9  PHOS 2.9 2.8  --  2.9 2.8  --    GFR: Estimated Creatinine Clearance: 67.1 mL/min (by C-G formula based on SCr of 0.55 mg/dL). Liver Function Tests: Recent Labs  Lab 08/31/22 0159 09/01/22 0255 09/03/22 0530  AST  --   --  23  ALT  --   --  21  ALKPHOS  --   --  78  BILITOT  --   --  0.3  PROT  --   --  4.8*  ALBUMIN 1.8* 1.6* 1.6*   No results for input(s): "LIPASE", "AMYLASE" in the last 168 hours. No results for input(s): "AMMONIA" in the last 168 hours. Coagulation Profile: No results for  input(s): "INR", "PROTIME" in the last 168 hours. Cardiac Enzymes: No results for input(s): "CKTOTAL", "CKMB", "CKMBINDEX", "TROPONINI" in the last 168 hours. BNP (last 3 results) No results for input(s): "PROBNP" in the last 8760 hours. HbA1C: No results for input(s): "HGBA1C" in the last 72 hours. CBG: Recent Labs  Lab 09/04/22 2301 09/05/22 0408 09/05/22 0759 09/05/22 1136 09/05/22 1556  GLUCAP 75 83 77 80 73   Lipid Profile: No results for input(s): "CHOL", "HDL", "LDLCALC", "TRIG", "CHOLHDL", "LDLDIRECT" in the last 72 hours. Thyroid Function Tests: No results for input(s): "TSH", "T4TOTAL", "FREET4", "T3FREE", "THYROIDAB" in the last 72 hours. Anemia Panel: No results for input(s): "VITAMINB12", "FOLATE", "FERRITIN", "TIBC", "IRON", "RETICCTPCT" in the last 72 hours. Sepsis Labs: No results for input(s): "PROCALCITON", "LATICACIDVEN" in the last 168 hours.  No results found for this or any previous visit (from the past 240 hour(s)).       Radiology Studies: DG CHEST PORT 1 VIEW  Result Date: 09/05/2022 CLINICAL DATA:  Respiratory insufficiency and effusion EXAM: PORTABLE CHEST 1 VIEW COMPARISON:  09/03/2022 FINDINGS: Left-sided PICC line tip at high right atrium or superior caval/atrial junction. Patient rotated left. Mild cardiomegaly. Persistent white out of the left hemithorax. No right-sided pleural effusion. No pneumothorax. The right lung is clear. IMPRESSION: Persistent left hemithorax opacification, likely secondary to a combination of large left pleural effusion and underlying left lung collapse and/or consolidation. Electronically Signed   By: Abigail Miyamoto M.D.   On: 09/05/2022 08:09        Scheduled Meds:  Chlorhexidine Gluconate Cloth  6 each Topical Daily   dorzolamide  1 drop Both Eyes BID   DULoxetine  40 mg Oral Daily   fesoterodine  4 mg Oral Daily   latanoprost  1 drop Both Eyes QHS   losartan  50 mg Oral Daily   nicotine  14 mg Transdermal Daily    pantoprazole  40 mg Oral BID   polyethylene glycol  17 g Oral Daily   Continuous Infusions:     LOS: 19 days   Time spent= 35 mins    Laurens Matheny Arsenio Loader, MD [facility]  If 7PM-7AM, please contact night-coverage  09/06/2022, 1:09 PM

## 2022-09-06 NOTE — Care Management Important Message (Signed)
Important Message  Patient Details IM Letter given. Name: Stephanie Benton MRN: [id number] Date of Birth: 21-May-1951   Medicare Important Message Given:  Yes     Kerin Salen 09/06/2022, 11:15 AM

## 2022-09-06 NOTE — Plan of Care (Signed)
  Problem: Education: Goal: Knowledge of risk factors and measures for prevention of condition will improve Outcome: Progressing   Problem: Coping: Goal: Psychosocial and spiritual needs will be supported Outcome: Progressing   Problem: Respiratory: Goal: Will maintain a patent airway Outcome: Progressing Goal: Complications related to the disease process, condition or treatment will be avoided or minimized Outcome: Progressing   Problem: Fluid Volume: Goal: Hemodynamic stability will improve Outcome: Progressing   Problem: Clinical Measurements: Goal: Diagnostic test results will improve Outcome: Progressing Goal: Signs and symptoms of infection will decrease Outcome: Progressing   Problem: Respiratory: Goal: Ability to maintain adequate ventilation will improve Outcome: Progressing   Problem: Education: Goal: Knowledge of General Education information will improve Description: Including pain rating scale, medication(s)/side effects and non-pharmacologic comfort measures Outcome: Progressing   Problem: Health Behavior/Discharge Planning: Goal: Ability to manage health-related needs will improve Outcome: Progressing   Problem: Activity: Goal: Risk for activity intolerance will decrease Outcome: Progressing   Problem: Nutrition: Goal: Adequate nutrition will be maintained Outcome: Progressing   Problem: Coping: Goal: Level of anxiety will decrease Outcome: Progressing   Problem: Elimination: Goal: Will not experience complications related to bowel motility Outcome: Progressing   Problem: Pain Managment: Goal: General experience of comfort will improve Outcome: Progressing   Problem: Safety: Goal: Ability to remain free from injury will improve Outcome: Progressing   Problem: Health Behavior/Discharge Planning: Goal: Ability to manage health-related needs will improve Outcome: Progressing   Problem: Clinical Measurements: Goal: Ability to maintain  clinical measurements within normal limits will improve Outcome: Progressing   Problem: Activity: Goal: Risk for activity intolerance will decrease Outcome: Progressing   Problem: Coping: Goal: Level of anxiety will decrease Outcome: Progressing   Problem: Elimination: Goal: Will not experience complications related to bowel motility Outcome: Progressing   Problem: Safety: Goal: Ability to remain free from injury will improve Outcome: Progressing   Problem: Skin Integrity: Goal: Risk for impaired skin integrity will decrease Outcome: Progressing

## 2022-09-06 NOTE — TOC Progression Note (Signed)
Transition of Care [facility]) - Progression Note    Patient Details  Name: Stephanie Benton MRN: [id number] Date of Birth: 1950/12/25  Transition of Care [facility]) CM/SW [place], LCSW Phone Number: 09/06/2022, 2:56 PM  Clinical Narrative:    Pt currently on comfort measures and is recommended for hospice services. Pt currently does not meet requirement for residential hospice.  CSW spoke with pt's son via t/c. Pt's son shares he currently lives in [place] and does not feel safe with pt being transported the distance to [place] to be able to stay with him. He shares that the only other family pt has is her brother and sister who both do not drive and have their own medical issues. Pt would be unable to stay with her brother or sister and family would be unable to provide care for her at home.  Pt's son shares that his mother receives [employer] farm [employer] and believes that the amount is $1,100 a month. He shares that his finances are tight due to cost of living in [place] and he is unable to financially support.  CSW has contacted [facility] to determine if pt could return to the facility at [place]. Per Kia they currently do not have any LTC beds available but, she is going to reach out to check about LTC placement for her at their facility or at [facility].    Expected Discharge Plan: [place] Barriers to Discharge: Continued Medical Work up  Expected Discharge Plan and Services In-house Referral: NA Discharge Planning Services: CM Consult Post Acute Care Choice: [place] Living arrangements for the past 2 months: [place] Expected Discharge Date: 09/02/22               DME Arranged: N/A DME Agency: NA       HH Arranged: NA HH Agency: NA         Social Determinants of Health (SDOH) Interventions SDOH Screenings   Food Insecurity: No Food Insecurity (08/19/2022)  Housing: Low Risk  (08/19/2022)  Recent  Concern: Housing - Medium Risk (06/21/2022)  Transportation Needs: No Transportation Needs (08/19/2022)  Recent Concern: Transportation Needs - Unmet Transportation Needs (06/21/2022)  Utilities: Not At Risk (08/19/2022)  Tobacco Use: High Risk (08/25/2022)    Readmission Risk Interventions    09/02/2022   11:01 AM 07/22/2022   11:58 AM 07/18/2022    8:43 AM  Readmission Risk Prevention Plan  Transportation Screening Complete  Complete  Medication Review (RN Care Manager) Complete  Complete  PCP or Specialist appointment within 3-5 days of discharge Complete  Complete  HRI or Home Care Consult Complete  Complete  SW Recovery Care/Counseling Consult Complete  Complete  Palliative Care Screening   Not [place] Complete Complete Not Applicable

## 2022-09-06 NOTE — Progress Notes (Signed)
Daily Progress Note   Patient Name: Stephanie Benton       Date: 09/06/2022 DOB: Feb 20, 1951  Age: 72 y.o. MRN#: [id number] Attending Physician: Damita Lack, MD Primary Care Physician: [name], MD Admit Date: 08/18/2022 Length of Stay: 19 days  Reason for Consultation/Follow-up: Establishing goals of care and Symptom Management  Subjective:   CC: Denies any symptoms of concern at this time.  Patient seen laying in bed with no family present at bedside.  Following up regarding complex medical decision making and symptom management.  Subjective:  Discussed care with bedside RN prior to seeing patient.  Patient has not required any as needed medications for symptom management.  Patient currently taking oral medications.  Presented to bedside to check on patient.  Patient laying comfortably in bed.  Patient has nasal cannula on though has been satting well on room air.  Patient herself has taken off the nasal cannula as it is uncomfortable so noted we would remove it at this time since it is all about her comfort.  Patient denies any symptoms of pain or shortness of breath.  Noted care team would continue to discuss supporting her comfort focused care moving forward with her son.  Patient grateful for visit today.  Discussed care with after visit including hospitalist, TOC, and RN.  Hospitalist was able to speak to son who noted that he is unable to bring patient to his home in [place] to care for her with hospice support.  Patient is not inpatient hospice appropriate at this time.  Discussed option is seeking long-term care placement with hospice support.  Appreciate TOC's assistance in this.  Review of Systems Denies any symptoms of concern Objective:   Vital Signs:  BP (!) 159/64   Pulse 95   Temp 98.7 F (37.1 C) (Oral)   Resp (!) 23   Ht 5\' 5"  (1.651 m)   Wt 81.7 kg   SpO2 91%   BMI 29.97 kg/m   Physical Exam: General: NAD, awake, laying in bed, pleasantly  oriented to self, chronically ill-appearing Eyes: No drainage noted HENT: dry mucous membranes Cardiovascular: RRR Respiratory: no increased work of breathing noted, not in respiratory distress Abdomen: Soft Extremities: Weakness in all extremities Skin: no rashes or lesions on visible skin Neuro: Oriented to self (baseline)  Imaging:  I personally reviewed recent imaging.   Assessment & Plan:   Assessment: Patient is 72 year old female with a past medical history of COPD, bipolar disorder, hypertension, hyperlipidemia, and pancreatitis who was admitted on 08/18/2022 from rehab for management of increased dyspnea.  She had recently been hospitalized with pneumonia and COVID-19 from 1/9-1/13 and received appropriate medical care during that time.  During this prolonged hospitalization, has had imaging that has shown opacification of the left hemithorax suggestive of mucous plugging.  Patient has undergone multiple interventions to assist with management.  Patient also had concern for possible GI bleed during hospitalization.  Patient remains in the ICU for aggressive medical care.  Despite multiple interventions, patient's respiratory status is not improving and left lung still showing opacification on chest x-ray.  PCCM has been following to assist with management.  Palliative medicine consulted to assist with complex medical decision making. Of note patient seen by palliative medicine providers during prior hospitalization.  Recommendations/Plan: # Complex medical decision making/goals of care:  - Patient was transition to comfort focused care on 09/05/2022 after discussion with patient's son, Stephanie Benton.  Continuing comfort focused care at this time I adjusting medications  as needed and assisting with coordination to determine safest option for patient to continue receiving comfort focused care outside of the hospital.  Hospitalist spoke with son who noted he is unable to bring patient home  with hospice.  Patient not currently appropriate for inpatient hospice.  Placed referral to [facility] to assist with long-term care placement with hospice.  Appreciate their support.  -  Code Status: DNR    -Pain/Dyspnea, acute on chronic in the setting of end-of-life care                Patient was not on medications for pain previously.                               -Continue IV Dilaudid 0.3 mg IV every 1 hour as needed.  Continue to adjust based on patient's symptom burden.  If patient needing frequent dosing, may need to consider continuous infusion.                               -Continue prn oxycodone if patient able to take oral medication.                  -Anxiety/agitation, in the setting of end-of-life care                               -Continue po/SL/IV Ativan 1 mg every 4 hours as needed. Continue to adjust based on patient's symptom burden.                                 -Continue po/SL/IV Haldol 0.5 mg every 4 hours as needed. Continue to adjust based on patient's symptom burden.                   -Secretions, in the setting of end-of-life care                               -Continue glycopyrrolate every 4 hours as needed.   # Psycho-social/Spiritual Support:  - Support System: son, grandson, daughter-in-law, sister  # Discharge Planning: TBD.  Patient continues with comfort focused care at this time.  Son unable to take patient home with hospice support.  Patient not appropriate for inpatient hospice at this time.  Will seek long-term care placement with hospice.  Appreciate TOC's assistance.  Discussed with: TOC, hospitalist, patient, bedside RN  Thank you for allowing the palliative care team to participate in the care [facility].  Chelsea Aus, DO Palliative Care Provider PMT # [phone]  If patient remains symptomatic despite maximum doses, please call PMT at [phone] between 0700 and 1900. Outside of these hours, please call attending, as PMT does not have night  coverage.  This provider spent a total of 38 minutes providing patient's care.  Includes review of EMR, discussing care with other staff members involved in patient's medical care, obtaining relevant history and information from patient and/or patient's family, and personal review of imaging and lab work. Greater than 50% of the time was spent counseling and coordinating care related to the above assessment and plan.   \

## 2022-09-07 DIAGNOSIS — R6520 Severe sepsis without septic shock: Secondary | ICD-10-CM | POA: Diagnosis not present

## 2022-09-07 DIAGNOSIS — A419 Sepsis, unspecified organism: Secondary | ICD-10-CM | POA: Diagnosis not present

## 2022-09-07 NOTE — Progress Notes (Signed)
Report given to Megan, RN.

## 2022-09-07 NOTE — Progress Notes (Signed)
PROGRESS NOTE    Stephanie Benton  ZJI:[id number] DOB: 01-20-1951 DOA: 08/18/2022 PCP: [name], MD   Brief Narrative:  72 year old with history of COPD, pancreatitis, bipolar admitted from SNF for increasing dyspnea.  Initially patient discharged on 1/13 after treated for COVID pneumonia and discharged on Omnicef.  Following day patient returned with hypoxia requiring 3-5 L nasal cannula with leukocytosis.  Initially admitted to the ICU on empiric antibiotics, hypertonic saline.  Patient was found to have left-sided hemithorax with opacification therefore PCCM was consulted.  There is also a drop in hemoglobin never required 2 units of PRBC transfusion.  Bronchoscopy performed on 1/17, removed thick mucous plug from the left mainstem and failed extubation requiring ICU transfer.  GI was also consulted due to drop in hemoglobin.  Antibiotics were broadened to 1/22 meropenem.  Eventually extubated.  Since then patient has had off-and-on worsening left lower lobe consolidation and again complete left-sided opacification on 1/30.  Significant Events: 1/15 presented with L hemithorax opacification and dyspnea, PCCM consult 1/16 more somnolent with Hgb drop,CXR without improvement in L-sided opacification, transfused 2 units PRBC for hemoglobin 5.5 --> 10 1/17 bronchoscopy with removal of thick mucous plugs from left mainstem and segmental bronchi, failed extubation postprocedure and transferred to ICU 1/18 Concern for GIB, Hgb drop 5.5, GI consulted. Remains on vent 1/20 antibiotics broadened to meropenem due to fevers and leukocytosis 1/21 Remains on vent, off pressors / precedex, more responsive 1/22 extubated 2/1-transition to comfort care Son unable to take care of the patient therefore [place] working on LTC placement with hospice   Assessment & Plan:  Principal Problem:   Severe sepsis with acute organ dysfunction ([place]) Active Problems:   Hypertension   Tobacco abuse counseling    Hypercholesterolemia   Bipolar disorder, in full remission, most recent episode mixed ([place])   LFTs abnormal   Hypokalemia   COPD (chronic obstructive pulmonary disease) ([place])   Severe sepsis ([place])   Acute metabolic encephalopathy   HCAP (healthcare-associated pneumonia)   Tobacco abuse   Atelectasis of left lung   Acute hypoxic respiratory failure (HCC)   Left lower lobe pulmonary infiltrate   Mucus plug in respiratory tract   Acute encephalopathy   Pressure injury of skin   Dysphagia   Muscular deconditioning   Acute respiratory failure with hypoxia (H[facility])   Respiratory insufficiency   Debility   Aspiration into airway   Palliative care encounter   Goals of care, counseling/discussion   Need for emotional support   High risk medication use   Pain   Dyspnea   Sepsis with acute organ dysfunction (HCC)   Frailty   Physical deconditioning   Counseling and coordination of care    Acute respiratory failure with hypoxia requiring mechanical ventilation status post bronchoscopy to the left hemithorax due to atelectasis due to mucous plugging Recurrent aspirations Completed long course of antibiotics of IV Vanco and meropenem.  Repeat chest x-ray showed reopacification of the left lung with concerns of mucous plugging.  Patient is now comfort care   COVID-19 positive: She has completed her isolation on 08/23/2022.   Acute metabolic encephalopathy: Supportive care   Acute blood loss anemia: Status post 2 units of packed red blood cells due to black tarry stools on 08/22/2018.  GI performed endoscopy on 1/19 showing gastritis.  PPI, iron supplements.  Patient is now comfort care   Hypokalemia/hypophosphatemia: Replete prn  Essential hypertension - Stop home, patient is now comfort care   Severe protein malnutrition/dysphagia:  Off tube feedings.  Tolerating her diet-dysphagia 1 diet but we can make this more liberal as tolerated   Goals of care/ethics: Palliative care to  discuss with family goals of care.   Significant muscular deconditioning: PT OT evaluated the patient will need skilled nursing facility.   Appreciate input from palliative care.  Patient has been now transitioned to comfort care   DVT prophylaxis:  Code Status: DNR Family Communication: Spoke with son [name] yesterday  Status is: Inpatient Remains inpatient appropriate because: Difficult disposition as patient does not qualify for inpatient hospice and unable to go home as her son lives out of town.  TOC consulted. Nutritional status    Signs/Symptoms: moderate fat depletion, moderate muscle depletion  Interventions: Tube feeding, Refer to RD note for recommendations  Body mass index is 29.97 kg/m.  Pressure Injury 08/22/22 Buttocks Left;Medial;Mid Stage 2 -  Partial thickness loss of dermis presenting as a shallow open injury with a red, pink wound bed without slough. (Active)  08/22/22 0730  Location: Buttocks  Location Orientation: Left;Medial;Mid  Staging: Stage 2 -  Partial thickness loss of dermis presenting as a shallow open injury with a red, pink wound bed without slough.  Wound Description (Comments):   Present on Admission: Yes     Pressure Injury 08/22/22 Buttocks Left;Medial;Mid;Upper Stage 2 -  Partial thickness loss of dermis presenting as a shallow open injury with a red, pink wound bed without slough. (Active)  08/22/22 0730  Location: Buttocks  Location Orientation: Left;Medial;Mid;Upper  Staging: Stage 2 -  Partial thickness loss of dermis presenting as a shallow open injury with a red, pink wound bed without slough.  Wound Description (Comments):   Present on Admission: Yes        Subjective: Seen and examined at bedside.  Patient does not have any complaints.  She understands currently we are awaiting to find long-term facility with hospice Examination: Constitutional: Chronically frail Respiratory: Clear to auscultation  bilaterally Cardiovascular: Normal sinus rhythm, no rubs Abdomen: Nontender nondistended good bowel sounds Musculoskeletal: No edema noted Skin: No rashes seen Neurologic: CN 2-12 grossly intact.  And nonfocal Psychiatric: Normal judgment and insight. Alert and oriented x 3. Normal mood.  Left upper extremity PICC line Foley catheter   Objective: Vitals:   09/06/22 2008 09/06/22 2020 09/07/22 0436 09/07/22 1356  BP: (!) 149/71 (!) 166/71 (!) 160/86 (!) 182/90  Pulse: (!) 101 100 (!) 102 (!) 101  Resp: 20  20 18   Temp: 98.3 F (36.8 C)  98.9 F (37.2 C) 99.1 F (37.3 C)  TempSrc: Oral  Oral   SpO2: 96% 98% 95% 96%  Weight:      Height:        Intake/Output Summary (Last 24 hours) at 09/07/2022 1358 Last data filed at 09/07/2022 0600 Gross per 24 hour  Intake --  Output 1250 ml  Net -1250 ml   Filed Weights   08/31/22 0500 09/02/22 0350 09/02/22 1556  Weight: 84.5 kg 80.9 kg 81.7 kg     Data Reviewed:   CBC: Recent Labs  Lab 09/02/22 1703 09/03/22 0530 09/04/22 0742 09/05/22 0643  WBC 6.1 5.2 5.4 5.4  NEUTROABS  --  3.7 3.7  --   HGB 8.2* 7.3* 7.2* 7.3*  HCT 26.6* 23.8* 24.0* 24.1*  MCV 92.0 92.2 94.5 93.4  PLT 282 253 251 188   Basic Metabolic Panel: Recent Labs  Lab 09/01/22 0255 09/02/22 1703 09/03/22 0530 09/04/22 0742 09/05/22 0643  NA 146* 144 143  144 142  K 3.1* 2.8* 2.8* 4.8 3.3*  CL 117* 116* 116* 121* 115*  CO2 21* 23 21* 21* 23  GLUCOSE 96 103* 94 83 78  BUN 7* 5* 6* 5* 5*  CREATININE 0.55 0.44 0.54 0.52 0.55  CALCIUM 8.0* 8.0* 8.0* 7.1* 8.1*  MG  --   --  2.0 1.7 1.9  PHOS 2.8  --  2.9 2.8  --    GFR: Estimated Creatinine Clearance: 67.1 mL/min (by C-G formula based on SCr of 0.55 mg/dL). Liver Function Tests: Recent Labs  Lab 09/01/22 0255 09/03/22 0530  AST  --  23  ALT  --  21  ALKPHOS  --  78  BILITOT  --  0.3  PROT  --  4.8*  ALBUMIN 1.6* 1.6*   No results for input(s): "LIPASE", "AMYLASE" in the last 168 hours. No  results for input(s): "AMMONIA" in the last 168 hours. Coagulation Profile: No results for input(s): "INR", "PROTIME" in the last 168 hours. Cardiac Enzymes: No results for input(s): "CKTOTAL", "CKMB", "CKMBINDEX", "TROPONINI" in the last 168 hours. BNP (last 3 results) No results for input(s): "PROBNP" in the last 8760 hours. HbA1C: No results for input(s): "HGBA1C" in the last 72 hours. CBG: Recent Labs  Lab 09/04/22 2301 09/05/22 0408 09/05/22 0759 09/05/22 1136 09/05/22 1556  GLUCAP 75 83 77 80 73   Lipid Profile: No results for input(s): "CHOL", "HDL", "LDLCALC", "TRIG", "CHOLHDL", "LDLDIRECT" in the last 72 hours. Thyroid Function Tests: No results for input(s): "TSH", "T4TOTAL", "FREET4", "T3FREE", "THYROIDAB" in the last 72 hours. Anemia Panel: No results for input(s): "VITAMINB12", "FOLATE", "FERRITIN", "TIBC", "IRON", "RETICCTPCT" in the last 72 hours. Sepsis Labs: No results for input(s): "PROCALCITON", "LATICACIDVEN" in the last 168 hours.  No results found for this or any previous visit (from the past 240 hour(s)).       Radiology Studies: No results found.      Scheduled Meds:  Chlorhexidine Gluconate Cloth  6 each Topical Daily   dorzolamide  1 drop Both Eyes BID   DULoxetine  40 mg Oral Daily   fesoterodine  4 mg Oral Daily   latanoprost  1 drop Both Eyes QHS   losartan  50 mg Oral Daily   nicotine  14 mg Transdermal Daily   pantoprazole  40 mg Oral BID   polyethylene glycol  17 g Oral Daily   Continuous Infusions:     LOS: 20 days   Time spent= 35 mins    Niguel Moure Arsenio Loader, MD [facility]  If 7PM-7AM, please contact night-coverage  09/07/2022, 1:58 PM

## 2022-09-07 NOTE — Plan of Care (Incomplete)
On comfort measure Problem: Education: Goal: Knowledge of risk factors and measures for prevention of condition will improve Outcome: Progressing   Problem: Coping: Goal: Psychosocial and spiritual needs will be supported Outcome: Progressing   Problem: Respiratory: Goal: Will maintain a patent airway Outcome: Progressing Goal: Complications related to the disease process, condition or treatment will be avoided or minimized Outcome: Progressing   Problem: Fluid Volume: Goal: Hemodynamic stability will improve Outcome: Progressing   Problem: Clinical Measurements: Goal: Diagnostic test results will improve Outcome: Progressing Goal: Signs and symptoms of infection will decrease Outcome: Progressing   Problem: Respiratory: Goal: Ability to maintain adequate ventilation will improve Outcome: Progressing   Problem: Education: Goal: Knowledge of General Education information will improve Description: Including pain rating scale, medication(s)/side effects and non-pharmacologic comfort measures Outcome: Progressing   Problem: Health Behavior/Discharge Planning: Goal: Ability to manage health-related needs will improve Outcome: Progressing   Problem: Clinical Measurements: Goal: Ability to maintain clinical measurements within normal limits will improve Outcome: Progressing Goal: Will remain free from infection Outcome: Progressing Goal: Diagnostic test results will improve Outcome: Progressing Goal: Respiratory complications will improve Outcome: Progressing Goal: Cardiovascular complication will be avoided Outcome: Progressing   Problem: Activity: Goal: Risk for activity intolerance will decrease Outcome: Progressing   Problem: Nutrition: Goal: Adequate nutrition will be maintained Outcome: Progressing   Problem: Coping: Goal: Level of anxiety will decrease Outcome: Progressing   Problem: Elimination: Goal: Will not experience complications related to bowel  motility Outcome: Progressing Goal: Will not experience complications related to urinary retention Outcome: Progressing   Problem: Pain Managment: Goal: General experience of comfort will improve Outcome: Progressing   Problem: Safety: Goal: Ability to remain free from injury will improve Outcome: Progressing   Problem: Skin Integrity: Goal: Risk for impaired skin integrity will decrease Outcome: Progressing   Problem: Education: Goal: Knowledge of General Education information will improve Description: Including pain rating scale, medication(s)/side effects and non-pharmacologic comfort measures Outcome: Progressing   Problem: Health Behavior/Discharge Planning: Goal: Ability to manage health-related needs will improve Outcome: Progressing   Problem: Clinical Measurements: Goal: Ability to maintain clinical measurements within normal limits will improve Outcome: Progressing Goal: Will remain free from infection Outcome: Progressing Goal: Diagnostic test results will improve Outcome: Progressing Goal: Respiratory complications will improve Outcome: Progressing Goal: Cardiovascular complication will be avoided Outcome: Progressing   Problem: Activity: Goal: Risk for activity intolerance will decrease Outcome: Progressing   Problem: Nutrition: Goal: Adequate nutrition will be maintained Outcome: Progressing   Problem: Coping: Goal: Level of anxiety will decrease Outcome: Progressing   Problem: Elimination: Goal: Will not experience complications related to bowel motility Outcome: Progressing Goal: Will not experience complications related to urinary retention Outcome: Progressing   Problem: Pain Managment: Goal: General experience of comfort will improve Outcome: Progressing   Problem: Safety: Goal: Ability to remain free from injury will improve Outcome: Progressing   Problem: Skin Integrity: Goal: Risk for impaired skin integrity will  decrease Outcome: Progressing   Problem: Activity: Goal: Ability to tolerate increased activity will improve Outcome: Progressing   Problem: Respiratory: Goal: Ability to maintain a clear airway and adequate ventilation will improve Outcome: Progressing   Problem: Role Relationship: Goal: Method of communication will improve Outcome: Progressing   Problem: Safety: Goal: Non-violent Restraint(s) Outcome: Progressing   Problem: Education: Goal: Knowledge of the prescribed therapeutic regimen will improve Outcome: Progressing   Problem: Coping: Goal: Ability to identify and develop effective coping behavior will improve Outcome: Progressing

## 2022-09-07 NOTE — Plan of Care (Signed)
  Problem: Education: Goal: Knowledge of risk factors and measures for prevention of condition will improve Outcome: Progressing   Problem: Coping: Goal: Psychosocial and spiritual needs will be supported Outcome: Progressing   Problem: Respiratory: Goal: Will maintain a patent airway Outcome: Progressing Goal: Complications related to the disease process, condition or treatment will be avoided or minimized Outcome: Progressing   Problem: Fluid Volume: Goal: Hemodynamic stability will improve Outcome: Progressing   Problem: Clinical Measurements: Goal: Diagnostic test results will improve Outcome: Progressing Goal: Signs and symptoms of infection will decrease Outcome: Progressing   Problem: Respiratory: Goal: Ability to maintain adequate ventilation will improve Outcome: Progressing   Problem: Education: Goal: Knowledge of General Education information will improve Description: Including pain rating scale, medication(s)/side effects and non-pharmacologic comfort measures Outcome: Progressing   Problem: Health Behavior/Discharge Planning: Goal: Ability to manage health-related needs will improve Outcome: Progressing   Problem: Clinical Measurements: Goal: Ability to maintain clinical measurements within normal limits will improve Outcome: Progressing Goal: Will remain free from infection Outcome: Progressing Goal: Diagnostic test results will improve Outcome: Progressing Goal: Respiratory complications will improve Outcome: Progressing Goal: Cardiovascular complication will be avoided Outcome: Progressing   Problem: Activity: Goal: Risk for activity intolerance will decrease Outcome: Progressing   Problem: Nutrition: Goal: Adequate nutrition will be maintained Outcome: Progressing   Problem: Coping: Goal: Level of anxiety will decrease Outcome: Progressing   Problem: Elimination: Goal: Will not experience complications related to bowel motility Outcome:  Progressing Goal: Will not experience complications related to urinary retention Outcome: Progressing   Problem: Pain Managment: Goal: General experience of comfort will improve Outcome: Progressing   Problem: Safety: Goal: Ability to remain free from injury will improve Outcome: Progressing   Problem: Skin Integrity: Goal: Risk for impaired skin integrity will decrease Outcome: Progressing   Problem: Education: Goal: Knowledge of General Education information will improve Description: Including pain rating scale, medication(s)/side effects and non-pharmacologic comfort measures Outcome: Progressing   Problem: Health Behavior/Discharge Planning: Goal: Ability to manage health-related needs will improve Outcome: Progressing   Problem: Clinical Measurements: Goal: Ability to maintain clinical measurements within normal limits will improve Outcome: Progressing Goal: Will remain free from infection Outcome: Progressing Goal: Diagnostic test results will improve Outcome: Progressing Goal: Respiratory complications will improve Outcome: Progressing Goal: Cardiovascular complication will be avoided Outcome: Progressing   Problem: Activity: Goal: Risk for activity intolerance will decrease Outcome: Progressing   Problem: Nutrition: Goal: Adequate nutrition will be maintained Outcome: Progressing   Problem: Coping: Goal: Level of anxiety will decrease Outcome: Progressing   Problem: Elimination: Goal: Will not experience complications related to bowel motility Outcome: Progressing Goal: Will not experience complications related to urinary retention Outcome: Progressing   Problem: Pain Managment: Goal: General experience of comfort will improve Outcome: Progressing   Problem: Safety: Goal: Ability to remain free from injury will improve Outcome: Progressing   Problem: Skin Integrity: Goal: Risk for impaired skin integrity will decrease Outcome: Progressing    Problem: Activity: Goal: Ability to tolerate increased activity will improve Outcome: Progressing   Problem: Respiratory: Goal: Ability to maintain a clear airway and adequate ventilation will improve Outcome: Progressing   Problem: Role Relationship: Goal: Method of communication will improve Outcome: Progressing   Problem: Safety: Goal: Non-violent Restraint(s) Outcome: Progressing   Problem: Education: Goal: Knowledge of the prescribed therapeutic regimen will improve Outcome: Progressing   Problem: Coping: Goal: Ability to identify and develop effective coping behavior will improve Outcome: Progressing

## 2022-09-08 DIAGNOSIS — F3178 Bipolar disorder, in full remission, most recent episode mixed: Secondary | ICD-10-CM | POA: Diagnosis not present

## 2022-09-08 DIAGNOSIS — T17908S Unspecified foreign body in respiratory tract, part unspecified causing other injury, sequela: Secondary | ICD-10-CM | POA: Diagnosis not present

## 2022-09-08 DIAGNOSIS — Z515 Encounter for palliative care: Secondary | ICD-10-CM | POA: Diagnosis not present

## 2022-09-08 DIAGNOSIS — A419 Sepsis, unspecified organism: Secondary | ICD-10-CM | POA: Diagnosis not present

## 2022-09-08 DIAGNOSIS — J9601 Acute respiratory failure with hypoxia: Secondary | ICD-10-CM | POA: Diagnosis not present

## 2022-09-08 DIAGNOSIS — R6520 Severe sepsis without septic shock: Secondary | ICD-10-CM | POA: Diagnosis not present

## 2022-09-08 MED ORDER — HYDROMORPHONE HCL 1 MG/ML IJ SOLN
0.5000 mg | INTRAMUSCULAR | Status: DC | PRN
  Administered 2022-09-08 – 2022-09-13 (×2): 0.5 mg via INTRAVENOUS
  Filled 2022-09-08 (×2): qty 0.5

## 2022-09-08 MED ORDER — OXYCODONE HCL 20 MG/ML PO CONC
5.0000 mg | ORAL | Status: DC | PRN
  Administered 2022-09-09 – 2022-09-12 (×8): 5 mg via SUBLINGUAL
  Filled 2022-09-08 (×3): qty 0.5
  Filled 2022-09-08: qty 0.3
  Filled 2022-09-08 (×5): qty 0.5

## 2022-09-08 MED ORDER — HYDROMORPHONE HCL 1 MG/ML IJ SOLN
0.3000 mg | INTRAMUSCULAR | Status: DC | PRN
  Administered 2022-09-08 (×2): 0.3 mg via INTRAVENOUS
  Filled 2022-09-08 (×2): qty 0.5

## 2022-09-08 MED ORDER — OXYCODONE HCL 20 MG/ML PO CONC
5.0000 mg | ORAL | Status: DC | PRN
  Administered 2022-09-08 – 2022-09-10 (×4): 5 mg via ORAL
  Filled 2022-09-08: qty 0.3
  Filled 2022-09-08 (×2): qty 0.5
  Filled 2022-09-08: qty 0.3
  Filled 2022-09-08: qty 0.5
  Filled 2022-09-08: qty 0.3

## 2022-09-08 NOTE — Plan of Care (Signed)
  Problem: Coping: Goal: Psychosocial and spiritual needs will be supported Outcome: Progressing   Problem: Health Behavior/Discharge Planning: Goal: Ability to manage health-related needs will improve Outcome: Progressing   

## 2022-09-08 NOTE — Progress Notes (Addendum)
Daily Progress Note   Patient Name: Stephanie Benton       Date: 09/08/2022 DOB: November 05, 1950  Age: 72 y.o. MRN#: [id number] Attending Physician: Damita Lack, MD Primary Care Physician: [name], MD Admit Date: 08/18/2022 Length of Stay: 21 days  Reason for Consultation/Follow-up: Establishing goals of care and Symptom Management  Subjective:   CC:  Patient seen laying in bed today.  Following up regarding complex medical decision making and symptom management.  Subjective:  Patient continues to receive comfort focused care.   At time of EMR review, patient has required IV dilaudid 0.3mg  x3 doses and oxycodone 5mg  x 1 dose.   TOC is working on LTC placement with hospice.  With plan for placement, have made further comfort medication adjustments today. Making oxycodone po/SL to be given prior to any IV medications. IV medications should only be for breakthrough.   Patient seen resting comfortably in bed today when seen.   Objective:   Vital Signs:  BP (!) 155/92 (BP Location: Left Arm)   Pulse 100   Temp 98.5 F (36.9 C)   Resp 20   Ht 5\' 5"  (1.651 m)   Wt 81.7 kg   SpO2 98%   BMI 29.97 kg/m   Physical Exam: General: NAD, laying in bed, resting Eyes: No drainage noted HENT: dry mucous membranes Cardiovascular: RRR Respiratory: no increased work of breathing noted, not in respiratory distress Abdomen: Soft Extremities: Weakness in all extremities Skin: no rashes or lesions on visible skin  Imaging:  I personally reviewed recent imaging.   Assessment & Plan:   Assessment: Patient is 72 year old female with a past medical history of COPD, bipolar disorder, hypertension, hyperlipidemia, and pancreatitis who was admitted on 08/18/2022 from rehab for management of increased dyspnea.  She had recently been hospitalized with pneumonia and COVID-19 from 1/9-1/13 and received appropriate medical care during that time.  During this prolonged hospitalization, has  had imaging that has shown opacification of the left hemithorax suggestive of mucous plugging.  Patient has undergone multiple interventions to assist with management.  Patient also had concern for possible GI bleed during hospitalization.  Patient remains in the ICU for aggressive medical care.  Despite multiple interventions, patient's respiratory status is not improving and left lung still showing opacification on chest x-ray.  PCCM has been following to assist with management.  Palliative medicine consulted to assist with complex medical decision making. Of note patient seen by palliative medicine providers during prior hospitalization.  Recommendations/Plan: # Complex medical decision making/goals of care:  - Patient was transitioned to comfort focused care on 09/05/2022 after discussion with patient's son, Tamantha Saline.  Continuing comfort focused care at this time. Son unable to provide hospice support at home; not currently eligible for inpatient hospice. TOC to assisting with long-term care placement with hospice.  Appreciate their support.  -  Code Status: DNR    -Pain/Dyspnea, acute on chronic in the setting of end-of-life care    -Change prn oxycodone po/SL 5mg  q4hrs prn as initial medication for management as working on LTC placement with hospice.                                -Change IV Dilaudid to 0.3 mg IV every 1 hour as needed for breakthrough secondary to oral medications.                   -Anxiety/agitation, in the setting  of end-of-life care                               -Continue po/SL Ativan 1 mg every 4 hours as needed. Continue to adjust based on patient's symptom burden.   -Discontinue IV Ativan                                -Continue SL/IV Haldol 0.5 mg every 4 hours as needed. IV to be for breakthrough management to orals. Continue to adjust based on patient's symptom burden.                   -Secretions, in the setting of end-of-life care                                -Change to glycopyrrolate 1mg  oral q4hrs prn.    # Psycho-social/Spiritual Support:  - Support System: son, grandson, daughter-in-law, sister  # Discharge Planning: TBD.  Patient continues with comfort focused care at this time.  Son unable to take patient home with hospice support.  Patient not eligable for inpatient hospice at this time.  TOC assistance with coordination of long-term care placement with hospice.   Thank you for allowing the palliative care team to participate in the care [facility].  Chelsea Aus, DO Palliative Care Provider PMT # [phone]  If patient remains symptomatic despite maximum doses, please call PMT at [phone] between 0700 and 1900. Outside of these hours, please call attending, as PMT does not have night coverage.

## 2022-09-08 NOTE — Plan of Care (Signed)
  Problem: Pain Managment: Goal: General experience of comfort will improve Outcome: Progressing   

## 2022-09-08 NOTE — Progress Notes (Signed)
0.25 ml oral Oxycodone (concentrated solution) given to patient. The other 0.25 ml remaining in syringe wasted in steri cycle. Waste witnessed by Rachael Fee, RN.

## 2022-09-08 NOTE — Progress Notes (Signed)
PROGRESS NOTE    Stephanie Benton  NID:[id number] DOB: Feb 02, 1951 DOA: 08/18/2022 PCP: [name], MD   Brief Narrative:  72 year old with history of COPD, pancreatitis, bipolar admitted from SNF for increasing dyspnea.  Initially patient discharged on 1/13 after treated for COVID pneumonia and discharged on Omnicef.  Following day patient returned with hypoxia requiring 3-5 L nasal cannula with leukocytosis.  Initially admitted to the ICU on empiric antibiotics, hypertonic saline.  Patient was found to have left-sided hemithorax with opacification therefore PCCM was consulted.  There is also a drop in hemoglobin never required 2 units of PRBC transfusion.  Bronchoscopy performed on 1/17, removed thick mucous plug from the left mainstem and failed extubation requiring ICU transfer.  GI was also consulted due to drop in hemoglobin.  Antibiotics were broadened to 1/22 meropenem.  Eventually extubated.  Since then patient has had off-and-on worsening left lower lobe consolidation and again complete left-sided opacification on 1/30.  Significant Events: 1/15 presented with L hemithorax opacification and dyspnea, PCCM consult 1/16 more somnolent with Hgb drop,CXR without improvement in L-sided opacification, transfused 2 units PRBC for hemoglobin 5.5 --> 10 1/17 bronchoscopy with removal of thick mucous plugs from left mainstem and segmental bronchi, failed extubation postprocedure and transferred to ICU 1/18 Concern for GIB, Hgb drop 5.5, GI consulted. Remains on vent 1/20 antibiotics broadened to meropenem due to fevers and leukocytosis 1/21 Remains on vent, off pressors / precedex, more responsive 1/22 extubated 2/1-transition to comfort care Son unable to take care of the patient therefore [facility] working on LTC placement with hospice   Assessment & Plan:  Principal Problem:   Severe sepsis with acute organ dysfunction ([place]) Active Problems:   Hypertension   Tobacco abuse counseling    Hypercholesterolemia   Bipolar disorder, in full remission, most recent episode mixed ([place])   LFTs abnormal   Hypokalemia   COPD (chronic obstructive pulmonary disease) ([place])   Severe sepsis ([place])   Acute metabolic encephalopathy   HCAP (healthcare-associated pneumonia)   Tobacco abuse   Atelectasis of left lung   Acute hypoxic respiratory failure (HCC)   Left lower lobe pulmonary infiltrate   Mucus plug in respiratory tract   Acute encephalopathy   Pressure injury of skin   Dysphagia   Muscular deconditioning   Acute respiratory failure with hypoxia (HCC)   Respiratory insufficiency   Debility   Aspiration into airway   Palliative care encounter   Goals of care, counseling/discussion   Need for emotional support   High risk medication use   Pain   Dyspnea   Sepsis with acute organ dysfunction (HCC)   Frailty   Physical deconditioning   Counseling and coordination of care    Acute respiratory failure with hypoxia requiring mechanical ventilation status post bronchoscopy to the left hemithorax due to atelectasis due to mucous plugging Recurrent aspirations Completed long course of antibiotics of IV Vanco and meropenem.  Repeat chest x-ray showed reopacification of the left lung with concerns of mucous plugging.  Patient remains comfort care   COVID-19 positive: She has completed her isolation on 08/23/2022.   Acute metabolic encephalopathy: Supportive care   Acute blood loss anemia: Status post 2 units of packed red blood cells due to black tarry stools on 08/22/2018.  GI performed endoscopy on 1/19 showing gastritis.  PPI, iron supplements.  Patient is now comfort care   Hypokalemia/hypophosphatemia: Replete prn  Essential hypertension - Stop home, patient is now comfort care   Severe protein malnutrition/dysphagia: Off  tube feedings.  Tolerating her diet-dysphagia 1 diet but we can make this more liberal as tolerated   Goals of care/ethics: Palliative care to  discuss with family goals of care.   Significant muscular deconditioning: PT OT evaluated the patient will need skilled nursing facility.   Appreciate input from palliative care.  Patient has been now transitioned to comfort care   DVT prophylaxis:  Code Status: DNR Family Communication: Spoke with son [name] yesterday  Status is: Inpatient Remains inpatient appropriate because: Difficult disposition as patient does not qualify for inpatient hospice and unable to go home as her son lives out of town.  TOC consulted. Nutritional status    Signs/Symptoms: moderate fat depletion, moderate muscle depletion  Interventions: Tube feeding, Refer to RD note for recommendations  Body mass index is 29.97 kg/m.  Pressure Injury 08/22/22 Buttocks Left;Medial;Mid Stage 2 -  Partial thickness loss of dermis presenting as a shallow open injury with a red, pink wound bed without slough. (Active)  08/22/22 0730  Location: Buttocks  Location Orientation: Left;Medial;Mid  Staging: Stage 2 -  Partial thickness loss of dermis presenting as a shallow open injury with a red, pink wound bed without slough.  Wound Description (Comments):   Present on Admission: Yes     Pressure Injury 08/22/22 Buttocks Left;Medial;Mid;Upper Stage 2 -  Partial thickness loss of dermis presenting as a shallow open injury with a red, pink wound bed without slough. (Active)  08/22/22 0730  Location: Buttocks  Location Orientation: Left;Medial;Mid;Upper  Staging: Stage 2 -  Partial thickness loss of dermis presenting as a shallow open injury with a red, pink wound bed without slough.  Wound Description (Comments):   Present on Admission: Yes        Subjective: Laying in the bed no complaints this morning Examination: Constitutional: Chronically frail Respiratory: Clear to auscultation bilaterally Cardiovascular: Normal sinus rhythm, no rubs Abdomen: Nontender nondistended good bowel sounds Musculoskeletal: No  edema noted Skin: No rashes seen Neurologic: CN 2-12 grossly intact.  And nonfocal Psychiatric: Normal judgment and insight. Alert and oriented x 3. Normal mood.  Left upper extremity PICC line Foley catheter   Objective: Vitals:   09/07/22 0436 09/07/22 1356 09/07/22 2216 09/08/22 0927  BP: (!) 160/86 (!) 182/90 (!) 155/92 (!) 155/94  Pulse: (!) 102 (!) 101 100 (!) 102  Resp: 20 18 20    Temp: 98.9 F (37.2 C) 99.1 F (37.3 C) 98.5 F (36.9 C)   TempSrc: Oral     SpO2: 95% 96% 98%   Weight:      Height:        Intake/Output Summary (Last 24 hours) at 09/08/2022 1115 Last data filed at 09/08/2022 1610 Gross per 24 hour  Intake 0 ml  Output 950 ml  Net -950 ml   Filed Weights   08/31/22 0500 09/02/22 0350 09/02/22 1556  Weight: 84.5 kg 80.9 kg 81.7 kg     Data Reviewed:   CBC: Recent Labs  Lab 09/02/22 1703 09/03/22 0530 09/04/22 0742 09/05/22 0643  WBC 6.1 5.2 5.4 5.4  NEUTROABS  --  3.7 3.7  --   HGB 8.2* 7.3* 7.2* 7.3*  HCT 26.6* 23.8* 24.0* 24.1*  MCV 92.0 92.2 94.5 93.4  PLT 282 253 251 960   Basic Metabolic Panel: Recent Labs  Lab 09/02/22 1703 09/03/22 0530 09/04/22 0742 09/05/22 0643  NA 144 143 144 142  K 2.8* 2.8* 4.8 3.3*  CL 116* 116* 121* 115*  CO2 23 21* 21* 23  GLUCOSE 103* 94 83 78  BUN 5* 6* 5* 5*  CREATININE 0.44 0.54 0.52 0.55  CALCIUM 8.0* 8.0* 7.1* 8.1*  MG  --  2.0 1.7 1.9  PHOS  --  2.9 2.8  --    GFR: Estimated Creatinine Clearance: 67.1 mL/min (by C-G formula based on SCr of 0.55 mg/dL). Liver Function Tests: Recent Labs  Lab 09/03/22 0530  AST 23  ALT 21  ALKPHOS 78  BILITOT 0.3  PROT 4.8*  ALBUMIN 1.6*   No results for input(s): "LIPASE", "AMYLASE" in the last 168 hours. No results for input(s): "AMMONIA" in the last 168 hours. Coagulation Profile: No results for input(s): "INR", "PROTIME" in the last 168 hours. Cardiac Enzymes: No results for input(s): "CKTOTAL", "CKMB", "CKMBINDEX", "TROPONINI" in the  last 168 hours. BNP (last 3 results) No results for input(s): "PROBNP" in the last 8760 hours. HbA1C: No results for input(s): "HGBA1C" in the last 72 hours. CBG: Recent Labs  Lab 09/04/22 2301 09/05/22 0408 09/05/22 0759 09/05/22 1136 09/05/22 1556  GLUCAP 75 83 77 80 73   Lipid Profile: No results for input(s): "CHOL", "HDL", "LDLCALC", "TRIG", "CHOLHDL", "LDLDIRECT" in the last 72 hours. Thyroid Function Tests: No results for input(s): "TSH", "T4TOTAL", "FREET4", "T3FREE", "THYROIDAB" in the last 72 hours. Anemia Panel: No results for input(s): "VITAMINB12", "FOLATE", "FERRITIN", "TIBC", "IRON", "RETICCTPCT" in the last 72 hours. Sepsis Labs: No results for input(s): "PROCALCITON", "LATICACIDVEN" in the last 168 hours.  No results found for this or any previous visit (from the past 240 hour(s)).       Radiology Studies: No results found.      Scheduled Meds:  Chlorhexidine Gluconate Cloth  6 each Topical Daily   dorzolamide  1 drop Both Eyes BID   DULoxetine  40 mg Oral Daily   fesoterodine  4 mg Oral Daily   latanoprost  1 drop Both Eyes QHS   losartan  50 mg Oral Daily   nicotine  14 mg Transdermal Daily   pantoprazole  40 mg Oral BID   polyethylene glycol  17 g Oral Daily   Continuous Infusions:     LOS: 21 days   Time spent= 35 mins    Jaquawn Saffran Arsenio Loader, MD [facility]  If 7PM-7AM, please contact night-coverage  09/08/2022, 11:15 AM

## 2022-09-09 DIAGNOSIS — A419 Sepsis, unspecified organism: Secondary | ICD-10-CM | POA: Diagnosis not present

## 2022-09-09 DIAGNOSIS — R6520 Severe sepsis without septic shock: Secondary | ICD-10-CM | POA: Diagnosis not present

## 2022-09-09 NOTE — Plan of Care (Signed)
  Problem: Coping: Goal: Level of anxiety will decrease Outcome: Progressing   Problem: Pain Managment: Goal: General experience of comfort will improve Outcome: Progressing   Problem: Safety: Goal: Ability to remain free from injury will improve Outcome: Progressing   

## 2022-09-09 NOTE — Progress Notes (Addendum)
PROGRESS NOTE    Stephanie Benton  NID:[id number] DOB: Feb 02, 1951 DOA: 08/18/2022 PCP: [name], MD   Brief Narrative:  72 year old with history of COPD, pancreatitis, bipolar admitted from SNF for increasing dyspnea.  Initially patient discharged on 1/13 after treated for COVID pneumonia and discharged on Omnicef.  Following day patient returned with hypoxia requiring 3-5 L nasal cannula with leukocytosis.  Initially admitted to the ICU on empiric antibiotics, hypertonic saline.  Patient was found to have left-sided hemithorax with opacification therefore PCCM was consulted.  There is also a drop in hemoglobin never required 2 units of PRBC transfusion.  Bronchoscopy performed on 1/17, removed thick mucous plug from the left mainstem and failed extubation requiring ICU transfer.  GI was also consulted due to drop in hemoglobin.  Antibiotics were broadened to 1/22 meropenem.  Eventually extubated.  Since then patient has had off-and-on worsening left lower lobe consolidation and again complete left-sided opacification on 1/30.  Significant Events: 1/15 presented with L hemithorax opacification and dyspnea, PCCM consult 1/16 more somnolent with Hgb drop,CXR without improvement in L-sided opacification, transfused 2 units PRBC for hemoglobin 5.5 --> 10 1/17 bronchoscopy with removal of thick mucous plugs from left mainstem and segmental bronchi, failed extubation postprocedure and transferred to ICU 1/18 Concern for GIB, Hgb drop 5.5, GI consulted. Remains on vent 1/20 antibiotics broadened to meropenem due to fevers and leukocytosis 1/21 Remains on vent, off pressors / precedex, more responsive 1/22 extubated 2/1-transition to comfort care Son unable to take care of the patient therefore [facility] working on LTC placement with hospice   Assessment & Plan:  Principal Problem:   Severe sepsis with acute organ dysfunction ([place]) Active Problems:   Hypertension   Tobacco abuse counseling    Hypercholesterolemia   Bipolar disorder, in full remission, most recent episode mixed ([place])   LFTs abnormal   Hypokalemia   COPD (chronic obstructive pulmonary disease) ([place])   Severe sepsis ([place])   Acute metabolic encephalopathy   HCAP (healthcare-associated pneumonia)   Tobacco abuse   Atelectasis of left lung   Acute hypoxic respiratory failure (HCC)   Left lower lobe pulmonary infiltrate   Mucus plug in respiratory tract   Acute encephalopathy   Pressure injury of skin   Dysphagia   Muscular deconditioning   Acute respiratory failure with hypoxia (HCC)   Respiratory insufficiency   Debility   Aspiration into airway   Palliative care encounter   Goals of care, counseling/discussion   Need for emotional support   High risk medication use   Pain   Dyspnea   Sepsis with acute organ dysfunction (HCC)   Frailty   Physical deconditioning   Counseling and coordination of care    Acute respiratory failure with hypoxia requiring mechanical ventilation status post bronchoscopy to the left hemithorax due to atelectasis due to mucous plugging Recurrent aspirations Completed long course of antibiotics of IV Vanco and meropenem.  Repeat chest x-ray showed reopacification of the left lung with concerns of mucous plugging.  Patient remains comfort care   COVID-19 positive: She has completed her isolation on 08/23/2022.   Acute metabolic encephalopathy: Supportive care   Acute blood loss anemia: Status post 2 units of packed red blood cells due to black tarry stools on 08/22/2018.  GI performed endoscopy on 1/19 showing gastritis.  PPI, iron supplements.  Patient is now comfort care   Hypokalemia/hypophosphatemia: Replete prn  Essential hypertension - Stop home, patient is now comfort care   Severe protein malnutrition/dysphagia: Off  tube feedings.  Tolerating her diet-dysphagia 1 diet but we can make this more liberal as tolerated   Goals of care/ethics: Palliative care to  discuss with family goals of care.   Significant muscular deconditioning: PT OT evaluated the patient will need skilled nursing facility.  Patient actively doesn't smoke, and doubt with her deconditioning she will be able to smoke anymore.    Overall patient is stable.  She is currently comfort care.  Does not qualify for inpatient hospice.  She would like to continue her routine medications   DVT prophylaxis: None Code Status: DNR Family Communication: Son [name] updated periodically  Status is: Inpatient Remains inpatient appropriate because: Difficult disposition as patient does not qualify for inpatient hospice and unable to go home as her son lives out of town.  TOC consulted. Nutritional status    Signs/Symptoms: moderate fat depletion, moderate muscle depletion  Interventions: Tube feeding, Refer to RD note for recommendations  Body mass index is 29.97 kg/m.  Pressure Injury 08/22/22 Buttocks Left;Medial;Mid Stage 2 -  Partial thickness loss of dermis presenting as a shallow open injury with a red, pink wound bed without slough. (Active)  08/22/22 0730  Location: Buttocks  Location Orientation: Left;Medial;Mid  Staging: Stage 2 -  Partial thickness loss of dermis presenting as a shallow open injury with a red, pink wound bed without slough.  Wound Description (Comments):   Present on Admission: Yes     Pressure Injury 08/22/22 Buttocks Left;Medial;Mid;Upper Stage 2 -  Partial thickness loss of dermis presenting as a shallow open injury with a red, pink wound bed without slough. (Active)  08/22/22 0730  Location: Buttocks  Location Orientation: Left;Medial;Mid;Upper  Staging: Stage 2 -  Partial thickness loss of dermis presenting as a shallow open injury with a red, pink wound bed without slough.  Wound Description (Comments):   Present on Admission: Yes        Subjective: Laying in the bed no complaints  Examination: Constitutional: Not in acute distress.   Appears chronically ill Respiratory: Clear to auscultation bilaterally Cardiovascular: Normal sinus rhythm, no rubs Abdomen: Nontender nondistended good bowel sounds Musculoskeletal: No edema noted Skin: No rashes seen Neurologic: CN 2-12 grossly intact.  And nonfocal Psychiatric: Normal judgment and insight. Alert and oriented x 3. Normal mood.  Left upper extremity PICC line Foley catheter   Objective: Vitals:   09/07/22 2216 09/08/22 0927 09/08/22 1419 09/09/22 0556  BP: (!) 155/92 (!) 155/94 (!) 176/82 (!) 157/77  Pulse: 100 (!) 102 (!) 109 (!) 104  Resp: 20  14 18   Temp: 98.5 F (36.9 C)  98.9 F (37.2 C) 97.9 F (36.6 C)  TempSrc:    Oral  SpO2: 98%  100% 97%  Weight:      Height:        Intake/Output Summary (Last 24 hours) at 09/09/2022 0740 Last data filed at 09/09/2022 0545 Gross per 24 hour  Intake 360 ml  Output 750 ml  Net -390 ml   Filed Weights   08/31/22 0500 09/02/22 0350 09/02/22 1556  Weight: 84.5 kg 80.9 kg 81.7 kg     Data Reviewed:   CBC: Recent Labs  Lab 09/02/22 1703 09/03/22 0530 09/04/22 0742 09/05/22 0643  WBC 6.1 5.2 5.4 5.4  NEUTROABS  --  3.7 3.7  --   HGB 8.2* 7.3* 7.2* 7.3*  HCT 26.6* 23.8* 24.0* 24.1*  MCV 92.0 92.2 94.5 93.4  PLT 282 253 251 400   Basic Metabolic Panel: Recent Labs  Lab 09/02/22 1703 09/03/22 0530 09/04/22 0742 09/05/22 0643  NA 144 143 144 142  K 2.8* 2.8* 4.8 3.3*  CL 116* 116* 121* 115*  CO2 23 21* 21* 23  GLUCOSE 103* 94 83 78  BUN 5* 6* 5* 5*  CREATININE 0.44 0.54 0.52 0.55  CALCIUM 8.0* 8.0* 7.1* 8.1*  MG  --  2.0 1.7 1.9  PHOS  --  2.9 2.8  --    GFR: Estimated Creatinine Clearance: 67.1 mL/min (by C-G formula based on SCr of 0.55 mg/dL). Liver Function Tests: Recent Labs  Lab 09/03/22 0530  AST 23  ALT 21  ALKPHOS 78  BILITOT 0.3  PROT 4.8*  ALBUMIN 1.6*   No results for input(s): "LIPASE", "AMYLASE" in the last 168 hours. No results for input(s): "AMMONIA" in the last 168  hours. Coagulation Profile: No results for input(s): "INR", "PROTIME" in the last 168 hours. Cardiac Enzymes: No results for input(s): "CKTOTAL", "CKMB", "CKMBINDEX", "TROPONINI" in the last 168 hours. BNP (last 3 results) No results for input(s): "PROBNP" in the last 8760 hours. HbA1C: No results for input(s): "HGBA1C" in the last 72 hours. CBG: Recent Labs  Lab 09/04/22 2301 09/05/22 0408 09/05/22 0759 09/05/22 1136 09/05/22 1556  GLUCAP 75 83 77 80 73   Lipid Profile: No results for input(s): "CHOL", "HDL", "LDLCALC", "TRIG", "CHOLHDL", "LDLDIRECT" in the last 72 hours. Thyroid Function Tests: No results for input(s): "TSH", "T4TOTAL", "FREET4", "T3FREE", "THYROIDAB" in the last 72 hours. Anemia Panel: No results for input(s): "VITAMINB12", "FOLATE", "FERRITIN", "TIBC", "IRON", "RETICCTPCT" in the last 72 hours. Sepsis Labs: No results for input(s): "PROCALCITON", "LATICACIDVEN" in the last 168 hours.  No results found for this or any previous visit (from the past 240 hour(s)).       Radiology Studies: No results found.      Scheduled Meds:  Chlorhexidine Gluconate Cloth  6 each Topical Daily   dorzolamide  1 drop Both Eyes BID   DULoxetine  40 mg Oral Daily   fesoterodine  4 mg Oral Daily   latanoprost  1 drop Both Eyes QHS   losartan  50 mg Oral Daily   nicotine  14 mg Transdermal Daily   pantoprazole  40 mg Oral BID   polyethylene glycol  17 g Oral Daily   Continuous Infusions:     LOS: 22 days   Time spent= 35 mins    Mikai Meints Arsenio Loader, MD [facility]  If 7PM-7AM, please contact night-coverage  09/09/2022, 7:40 AM

## 2022-09-09 NOTE — Plan of Care (Signed)
  Problem: Respiratory: Goal: Will maintain a patent airway Outcome: Progressing Goal: Complications related to the disease process, condition or treatment will be avoided or minimized Outcome: Progressing   Problem: Nutrition: Goal: Adequate nutrition will be maintained Outcome: Progressing   

## 2022-09-09 NOTE — Progress Notes (Signed)
  Daily Progress Note   Patient Name: Stephanie Benton       Date: 09/09/2022 DOB: 12/31/1950  Age: 72 y.o. MRN#: [id number] Attending Physician: Damita Lack, MD Primary Care Physician: [name], MD Admit Date: 08/18/2022 Length of Stay: 22 days  Discussed care with primary hospitalist today. Patient last seen by this palliative provider on 09/08/22. At that time, patient's goals for care remained focused on comfort care only. TOC working to determine disposition planning as patient is not eligible for inpatient hospice and patient's son cannot care for patient at home with hospice support. PMT will be available as need; please reach out if acute issues arise. Thank you.    Chelsea Aus, DO Palliative Care Provider PMT # [phone]

## 2022-09-09 NOTE — TOC Progression Note (Signed)
Transition of Care [facility]) - Progression Note   Patient Details  Name: Stephanie Benton MRN: [id number] Date of Birth: 10/14/1950  Transition of Care [facility]) CM/SW [place], LCSW Phone Number: 09/09/2022, 1:49 PM  Clinical Narrative: CSW is coordinating a possible LTC admission to [facility] through [facility]. CSW confirmed with hospitalist that patient is unable to currently smoke, so [facility] is willing to review the patient's clinicals. TOC awaiting to hear if patient is accepted to [facility] for LTC with hospice or if another LTC will need to be pursued.  Expected Discharge Plan: [place] Barriers to Discharge: Continued Medical Work up  Expected Discharge Plan and Services In-house Referral: NA Discharge Planning Services: CM Consult Post Acute Care Choice: [place] Living arrangements for the past 2 months: [place] ([place]) Expected Discharge Date: 09/02/22               DME Arranged: N/A DME Agency: NA HH Arranged: NA HH Agency: NA  Social Determinants of Health (SDOH) Interventions SDOH Screenings   Food Insecurity: No Food Insecurity (08/19/2022)  Housing: Low Risk  (08/19/2022)  Recent Concern: Housing - Medium Risk (06/21/2022)  Transportation Needs: No Transportation Needs (08/19/2022)  Recent Concern: Transportation Needs - Unmet Transportation Needs (06/21/2022)  Utilities: Not At Risk (08/19/2022)  Tobacco Use: High Risk (08/25/2022)   Readmission Risk Interventions    09/02/2022   11:01 AM 07/22/2022   11:58 AM 07/18/2022    8:43 AM  Readmission Risk Prevention Plan  Transportation Screening Complete  Complete  Medication Review (RN Care Manager) Complete  Complete  PCP or Specialist appointment within 3-5 days of discharge Complete  Complete  HRI or Home Care Consult Complete  Complete  SW Recovery Care/Counseling Consult Complete  Complete  Palliative Care Screening   Not  [place] Complete Complete Not Applicable

## 2022-09-10 DIAGNOSIS — R6520 Severe sepsis without septic shock: Secondary | ICD-10-CM | POA: Diagnosis not present

## 2022-09-10 DIAGNOSIS — A419 Sepsis, unspecified organism: Secondary | ICD-10-CM | POA: Diagnosis not present

## 2022-09-10 MED ORDER — OXYCODONE HCL 20 MG/ML PO CONC: 5.0000 mg | ORAL | 0 refills | Status: AC | PRN

## 2022-09-10 MED ORDER — LORAZEPAM 2 MG/ML PO CONC: 1.0000 mg | ORAL | 0 refills | Status: AC | PRN

## 2022-09-10 MED ORDER — LOSARTAN POTASSIUM 50 MG PO TABS: 50.0000 mg | ORAL_TABLET | Freq: Every day | ORAL | Status: DC

## 2022-09-10 MED ORDER — TRAMADOL HCL 50 MG PO TABS: 50.0000 mg | ORAL_TABLET | Freq: Three times a day (TID) | ORAL | 0 refills | Status: AC | PRN

## 2022-09-10 NOTE — Progress Notes (Signed)
PROGRESS NOTE    Stephanie Benton  BJS:[id number] DOB: 1951/07/03 DOA: 08/18/2022 PCP: [name], MD   Brief Narrative:  72 year old with history of COPD, pancreatitis, bipolar admitted from SNF for increasing dyspnea.  Initially patient discharged on 1/13 after treated for COVID pneumonia and discharged on Omnicef.  Following day patient returned with hypoxia requiring 3-5 L nasal cannula with leukocytosis.  Initially admitted to the ICU on empiric antibiotics, hypertonic saline.  Patient was found to have left-sided hemithorax with opacification therefore PCCM was consulted.  There is also a drop in hemoglobin never required 2 units of PRBC transfusion.  Bronchoscopy performed on 1/17, removed thick mucous plug from the left mainstem and failed extubation requiring ICU transfer.  GI was also consulted due to drop in hemoglobin.  Antibiotics were broadened to 1/22 meropenem.  Eventually extubated.  Since then patient has had off-and-on worsening left lower lobe consolidation and again complete left-sided opacification on 1/30.  Significant Events: 1/15 presented with L hemithorax opacification and dyspnea, PCCM consult 1/16 more somnolent with Hgb drop,CXR without improvement in L-sided opacification, transfused 2 units PRBC for hemoglobin 5.5 --> 10 1/17 bronchoscopy with removal of thick mucous plugs from left mainstem and segmental bronchi, failed extubation postprocedure and transferred to ICU 1/18 Concern for GIB, Hgb drop 5.5, GI consulted. Remains on vent 1/20 antibiotics broadened to meropenem due to fevers and leukocytosis 1/21 Remains on vent, off pressors / precedex, more responsive 1/22 extubated 2/1-transition to comfort care Son unable to take care of the patient therefore [place] working on LTC placement with hospice   Assessment & Plan:  Principal Problem:   Severe sepsis with acute organ dysfunction (Stephanie Benton) Active Problems:   Hypertension   Tobacco abuse counseling    Hypercholesterolemia   Bipolar disorder, in full remission, most recent episode mixed (Stephanie Benton)   LFTs abnormal   Hypokalemia   COPD (chronic obstructive pulmonary disease) ([place])   Severe sepsis ([place])   Acute metabolic encephalopathy   HCAP (healthcare-associated pneumonia)   Tobacco abuse   Atelectasis of left lung   Acute hypoxic respiratory failure (HCC)   Left lower lobe pulmonary infiltrate   Mucus plug in respiratory tract   Acute encephalopathy   Pressure injury of skin   Dysphagia   Muscular deconditioning   Acute respiratory failure with hypoxia (HCC)   Respiratory insufficiency   Debility   Aspiration into airway   Palliative care encounter   Goals of care, counseling/discussion   Need for emotional support   High risk medication use   Pain   Dyspnea   Sepsis with acute organ dysfunction (HCC)   Frailty   Physical deconditioning   Counseling and coordination of care    Acute respiratory failure with hypoxia requiring mechanical ventilation status post bronchoscopy to the left hemithorax due to atelectasis due to mucous plugging Recurrent aspirations Doing better, completed course of vancomycin and meropenem.  Concerns of recurrent mucous plugging but for now she has remained stable.  She is comfort care.   COVID-19 positive: She has completed her isolation on 08/23/2022.   Acute metabolic encephalopathy: Supportive care   Acute blood loss anemia: Status post 2 units of packed red blood cells due to black tarry stools on 08/22/2018.  GI performed endoscopy on 1/19 showing gastritis.  PPI, iron supplements.  Patient is now comfort care   Hypokalemia/hypophosphatemia: Replete prn  Essential hypertension - Stop home, patient is now comfort care   Severe protein malnutrition/dysphagia: Off tube feedings.  Tolerating  her diet-dysphagia 1 diet but we can make this more liberal as tolerated   Goals of care/ethics: Palliative care to discuss with family goals of  care.   Significant muscular deconditioning: PT OT evaluated the patient will need skilled nursing facility.  Patient actively doesn't smoke, and doubt with her deconditioning she will be able to smoke anymore.  [facility] completed, scripts signed and placed in the chart, DNR form placed in the chart.  Can be updated prior to discharge if necessary. Remove PICC line prior to discharge   Overall patient is stable.  She is currently comfort care.  Does not qualify for inpatient hospice.  She would like to continue her routine medications   DVT prophylaxis: None Code Status: DNR Family Communication: Son [name] updated periodically  Status is: Inpatient Remains inpatient appropriate because: Awaiting placement at long-term care facility with hospice.  TOC working on this Nutritional status    Signs/Symptoms: moderate fat depletion, moderate muscle depletion  Interventions: Tube feeding, Refer to RD note for recommendations  Body mass index is 29.97 kg/m.  Pressure Injury 08/22/22 Buttocks Left;Medial;Mid Stage 2 -  Partial thickness loss of dermis presenting as a shallow open injury with a red, pink wound bed without slough. (Active)  08/22/22 0730  Location: Buttocks  Location Orientation: Left;Medial;Mid  Staging: Stage 2 -  Partial thickness loss of dermis presenting as a shallow open injury with a red, pink wound bed without slough.  Wound Description (Comments):   Present on Admission: Yes     Pressure Injury 08/22/22 Buttocks Left;Medial;Mid;Upper Stage 2 -  Partial thickness loss of dermis presenting as a shallow open injury with a red, pink wound bed without slough. (Active)  08/22/22 0730  Location: Buttocks  Location Orientation: Left;Medial;Mid;Upper  Staging: Stage 2 -  Partial thickness loss of dermis presenting as a shallow open injury with a red, pink wound bed without slough.  Wound Description (Comments):   Present on Admission: Yes        Subjective: Seen  and examined at bedside, does not have any complaints.  She wants me to increase her pain medications which I explained to her I will increase it as she is comfort care patient  Examination: Constitutional: Not in acute distress.  Chronically ill-appearing Respiratory: Clear to auscultation bilaterally Cardiovascular: Normal sinus rhythm, no rubs Abdomen: Nontender nondistended good bowel sounds Musculoskeletal: No edema noted Skin: No rashes seen Neurologic: CN 2-12 grossly intact.  And nonfocal Psychiatric: Normal judgment and insight. Alert and oriented x 3. Normal mood.  Left upper extremity PICC line Foley catheter   Objective: Vitals:   09/08/22 1419 09/09/22 0556 09/09/22 1304 09/09/22 2242  BP: (!) 176/82 (!) 157/77 (!) 179/85 (!) 149/87  Pulse: (!) 109 (!) 104 100 98  Resp: 14 18 16 16   Temp: 98.9 F (37.2 C) 97.9 F (36.6 C) 98.5 F (36.9 C) 98.7 F (37.1 C)  TempSrc:  Oral Oral Oral  SpO2: 100% 97% 97% 97%  Weight:      Height:        Intake/Output Summary (Last 24 hours) at 09/10/2022 0743 Last data filed at 09/10/2022 0500 Gross per 24 hour  Intake 300 ml  Output 800 ml  Net -500 ml   Filed Weights   08/31/22 0500 09/02/22 0350 09/02/22 1556  Weight: 84.5 kg 80.9 kg 81.7 kg     Data Reviewed:   CBC: Recent Labs  Lab 09/04/22 0742 09/05/22 0643  WBC 5.4 5.4  NEUTROABS 3.7  --  HGB 7.2* 7.3*  HCT 24.0* 24.1*  MCV 94.5 93.4  PLT 251 983   Basic Metabolic Panel: Recent Labs  Lab 09/04/22 0742 09/05/22 0643  NA 144 142  K 4.8 3.3*  CL 121* 115*  CO2 21* 23  GLUCOSE 83 78  BUN 5* 5*  CREATININE 0.52 0.55  CALCIUM 7.1* 8.1*  MG 1.7 1.9  PHOS 2.8  --    GFR: Estimated Creatinine Clearance: 67.1 mL/min (by C-G formula based on SCr of 0.55 mg/dL). Liver Function Tests: No results for input(s): "AST", "ALT", "ALKPHOS", "BILITOT", "PROT", "ALBUMIN" in the last 168 hours.  No results for input(s): "LIPASE", "AMYLASE" in the last 168  hours. No results for input(s): "AMMONIA" in the last 168 hours. Coagulation Profile: No results for input(s): "INR", "PROTIME" in the last 168 hours. Cardiac Enzymes: No results for input(s): "CKTOTAL", "CKMB", "CKMBINDEX", "TROPONINI" in the last 168 hours. BNP (last 3 results) No results for input(s): "PROBNP" in the last 8760 hours. HbA1C: No results for input(s): "HGBA1C" in the last 72 hours. CBG: Recent Labs  Lab 09/04/22 2301 09/05/22 0408 09/05/22 0759 09/05/22 1136 09/05/22 1556  GLUCAP 75 83 77 80 73   Lipid Profile: No results for input(s): "CHOL", "HDL", "LDLCALC", "TRIG", "CHOLHDL", "LDLDIRECT" in the last 72 hours. Thyroid Function Tests: No results for input(s): "TSH", "T4TOTAL", "FREET4", "T3FREE", "THYROIDAB" in the last 72 hours. Anemia Panel: No results for input(s): "VITAMINB12", "FOLATE", "FERRITIN", "TIBC", "IRON", "RETICCTPCT" in the last 72 hours. Sepsis Labs: No results for input(s): "PROCALCITON", "LATICACIDVEN" in the last 168 hours.  No results found for this or any previous visit (from the past 240 hour(s)).       Radiology Studies: No results found.      Scheduled Meds:  Chlorhexidine Gluconate Cloth  6 each Topical Daily   dorzolamide  1 drop Both Eyes BID   DULoxetine  40 mg Oral Daily   fesoterodine  4 mg Oral Daily   latanoprost  1 drop Both Eyes QHS   losartan  50 mg Oral Daily   nicotine  14 mg Transdermal Daily   pantoprazole  40 mg Oral BID   polyethylene glycol  17 g Oral Daily   Continuous Infusions:     LOS: 23 days   Time spent= 35 mins    Makenlee Mckeag Arsenio Loader, MD [facility]  If 7PM-7AM, please contact night-coverage  09/10/2022, 7:43 AM

## 2022-09-10 NOTE — TOC Progression Note (Addendum)
Transition of Care [facility]) - Progression Note   Patient Details  Name: Stephanie Benton MRN: [id number] Date of Birth: November 06, 1950  Transition of [facility]) CM/SW [place], LCSW Phone Number: 09/10/2022, 1:06 PM  Clinical Narrative: CSW followed up with Tonya in admissions at [facility]. Per Kenney Houseman, the facility is in a bed lock so the patient will be admitted once a bed becomes available. There are no beds available today. CSW updated patient's son, Rylinn Linzy.  Addendum: CSW faxed patient out to additional SNFs for LTC bed with hospice.  Expected Discharge Plan: [place] Barriers to Discharge: Continued Medical Work up  Expected Discharge Plan and Services In-house Referral: NA Discharge Planning Services: CM Consult Post Acute Care Choice: [place] Living arrangements for the past 2 months: [place] ([place]) Expected Discharge Date: 09/02/22               DME Arranged: N/A DME Agency: NA HH Arranged: NA HH Agency: NA  Social Determinants of Health (SDOH) Interventions SDOH Screenings   Food Insecurity: No Food Insecurity (08/19/2022)  Housing: Low Risk  (08/19/2022)  Recent Concern: Housing - Medium Risk (06/21/2022)  Transportation Needs: No Transportation Needs (08/19/2022)  Recent Concern: Transportation Needs - Unmet Transportation Needs (06/21/2022)  Utilities: Not At Risk (08/19/2022)  Tobacco Use: High Risk (08/25/2022)   Readmission Risk Interventions    09/02/2022   11:01 AM 07/22/2022   11:58 AM 07/18/2022    8:43 AM  Readmission Risk Prevention Plan  Transportation Screening Complete  Complete  Medication Review (RN Care Manager) Complete  Complete  PCP or Specialist appointment within 3-5 days of discharge Complete  Complete  HRI or Home Care Consult Complete  Complete  SW Recovery Care/Counseling Consult Complete  Complete  Palliative Care Screening   Not [place] Complete Complete Not Applicable

## 2022-09-10 NOTE — Progress Notes (Signed)
Daily Progress Note   Patient Name: Stephanie Benton       Date: 09/10/2022 DOB: Nov 07, 1950  Age: 72 y.o. MRN#: [id number] Attending Physician: Damita Lack, MD Primary Care Physician: [name], MD Admit Date: 08/18/2022 Length of Stay: 23 days  Reason for Consultation/Follow-up: Establishing goals of care and Symptom Management  Subjective:   CC:  Patient seen laying in bed today.  Following up regarding complex medical decision making and symptom management.  Subjective:  Patient continues to receive comfort focused care.   Chart reviewed  Noted medication history regarding pain and non pain symptom management needs.    TOC is working on LTC placement with hospice.     Patient seen resting comfortably in bed today when seen. Sitter at bedside.    Objective:   Vital Signs:  BP (!) 149/87 (BP Location: Right Arm)   Pulse 98   Temp 98.7 F (37.1 C) (Oral)   Resp 16   Ht 5\' 5"  (1.651 m)   Wt 81.7 kg   SpO2 97%   BMI 29.97 kg/m   Physical Exam: General: NAD, laying in bed, resting Eyes: No drainage noted HENT: dry mucous membranes Cardiovascular: RRR Respiratory: no increased work of breathing noted, not in respiratory distress Abdomen: Soft Extremities: Weakness in all extremities Skin: no rashes or lesions on visible skin  Imaging:  I personally reviewed recent imaging.   Assessment & Plan:   Assessment: Patient is 72 year old female with a past medical history of COPD, bipolar disorder, hypertension, hyperlipidemia, and pancreatitis who was admitted on 08/18/2022 from rehab for management of increased dyspnea.  She had recently been hospitalized with pneumonia and COVID-19 from 1/9-1/13 and received appropriate medical care during that time.  During this prolonged hospitalization, has had imaging that has shown opacification of the left hemithorax suggestive of mucous plugging.  Patient has undergone multiple interventions to assist with management.   Patient also had concern for possible GI bleed during hospitalization.  Patient remains in the ICU for aggressive medical care.  Despite multiple interventions, patient's respiratory status is not improving and left lung still showing opacification on chest x-ray.  PCCM has been following to assist with management.  Palliative medicine consulted to assist with complex medical decision making. Of note patient seen by palliative medicine providers during prior hospitalization.  Recommendations/Plan: # Complex medical decision making/goals of care:  - Patient was transitioned to comfort focused care on 09/05/2022 after discussion with patient's son, Adelai Achey.  Continuing comfort focused care at this time. Son unable to provide hospice support at home; not currently eligible for inpatient hospice. TOC to assisting with long-term care placement with hospice.  Appreciate their support.  -  Code Status: DNR    -Pain/Dyspnea, acute on chronic in the setting of end-of-life care     prn oxycodone po/SL 5mg  q4hrs prn as initial medication for management as working on LTC placement with hospice.                           IV Dilaudid to 0.3 mg IV every 1 hour as needed for breakthrough secondary to oral medications.                   -Anxiety/agitation, in the setting of end-of-life care                               -  Continue po/SL Ativan 1 mg every 4 hours as needed. Continue to adjust based on patient's symptom burden.                                  -Continue SL/IV Haldol 0.5 mg every 4 hours as needed. IV to be for breakthrough management to orals. Continue to adjust based on patient's symptom burden.                   -Secretions, in the setting of end-of-life care                               - glycopyrrolate 1mg  oral q4hrs prn.    # Psycho-social/Spiritual Support:  - Support System: son, grandson, daughter-in-law, sister  # Discharge Planning: chart reviewed, TOC following, appreciate input  regarding  finding LTC with hospice.  Patient to continue with comfort focused care at this time.     Thank you for allowing the palliative care team to participate in the care [facility].  Loistine Chance MD Palliative Care Provider PMT # [phone]  If patient remains symptomatic despite maximum doses, please call PMT at [phone] between 0700 and 1900. Outside of these hours, please call attending, as PMT does not have night coverage.

## 2022-09-11 DIAGNOSIS — J9601 Acute respiratory failure with hypoxia: Secondary | ICD-10-CM | POA: Diagnosis not present

## 2022-09-11 DIAGNOSIS — A419 Sepsis, unspecified organism: Secondary | ICD-10-CM | POA: Diagnosis not present

## 2022-09-11 DIAGNOSIS — G9341 Metabolic encephalopathy: Secondary | ICD-10-CM | POA: Diagnosis not present

## 2022-09-11 DIAGNOSIS — R6520 Severe sepsis without septic shock: Secondary | ICD-10-CM | POA: Diagnosis not present

## 2022-09-11 DIAGNOSIS — G9340 Encephalopathy, unspecified: Secondary | ICD-10-CM | POA: Diagnosis not present

## 2022-09-11 NOTE — Plan of Care (Signed)
  Problem: Education: Goal: Knowledge of risk factors and measures for prevention of condition will improve Outcome: Progressing   Problem: Respiratory: Goal: Ability to maintain adequate ventilation will improve Outcome: Progressing   Problem: Nutrition: Goal: Adequate nutrition will be maintained Outcome: Progressing

## 2022-09-11 NOTE — Progress Notes (Signed)
[facility]                                                                              Stephanie Benton, is a 72 y.o. female, DOB - Oct 27, 1950, POE:[id number] Admit date - 08/18/2022    Outpatient Primary MD for the patient is Paliwal, Himanshu, MD  LOS - 24  days  Chief Complaint  Patient presents with   Shortness of Breath       Brief summary  72 year old with history of COPD, pancreatitis, bipolar admitted from SNF for increasing dyspnea.  Initially patient discharged on 1/13 after treated for COVID pneumonia and discharged on Omnicef.  Following day patient returned with hypoxia requiring 3-5 L nasal cannula with leukocytosis.  Initially admitted to the ICU on empiric antibiotics, hypertonic saline.  Patient was found to have left-sided hemithorax with opacification therefore PCCM was consulted.  There is also a drop in hemoglobin never required 2 units of PRBC transfusion.  Bronchoscopy performed on 1/17, removed thick mucous plug from the left mainstem and failed extubation requiring ICU transfer.  GI was also consulted due to drop in hemoglobin.  Antibiotics were broadened to 1/22 meropenem.  Eventually extubated.  Since then patient has had off-and-on worsening left lower lobe consolidation and again complete left-sided opacification on 1/30.   Significant Events: 1/15 presented with L hemithorax opacification and dyspnea, PCCM consult 1/16 more somnolent with Hgb drop,CXR without improvement in L-sided opacification, transfused 2 units PRBC for hemoglobin 5.5 --> 10 1/17 bronchoscopy with removal of thick mucous plugs from left mainstem and segmental bronchi, failed extubation postprocedure and transferred to ICU 1/18 Concern for GIB, Hgb drop 5.5, GI consulted. Remains on vent 1/20 antibiotics broadened to meropenem due to fevers and leukocytosis 1/21 Remains on vent, off pressors / precedex, more responsive 1/22 extubated 2/1-transition to comfort care Son unable  to take care of the patient therefore [place] working on LTC placement with hospice    Assessment & Plan    Acute respiratory failure with hypoxia requiring mechanical ventilation  S/p bronchoscopy to the left hemithorax due to atelectasis and mucous plugging Recurrent aspiration -Patient has completed a course of vancomycin and meropenem -Concern for recurrent mucous plugging, currently comfort care    COVID-19 positive: -Patient completed isolation on 10/18/4006    Acute metabolic encephalopathy: -Continue supportive care  -Currently comfort care   Acute blood loss anemia: - Status post 2 units of packed red blood cells due to black tarry stools on 08/22/2018. -EGD on 1/19 had shown gastritis.  -Patient was placed on PPI, iron supplementation, now comfort care     Hypokalemia/hypophosphatemia: -Was replaced as needed, now comfort care   Essential hypertension - Not on any antihypertensives, comfort care   Severe protein malnutrition/dysphagia: - Off tube feedings.  -Continue diet as tolerated, comfort care    Significant muscular deconditioning: PT OT recommended SNF  -TOC assisting on LTC placement with hospice  Pressure Injury Documentation: Left buttocks medial stage II, POA -Wound care per nursing  Moderate protein calorie malnutrition Nutrition Problem: Moderate Malnutrition Etiology: chronic illness (chronic pancreatitis, COPD) Signs/Symptoms: moderate fat depletion, moderate muscle depletion Interventions: Tube  feeding, Refer to RD note for recommendations Estimated body mass index is 29.97 kg/m as calculated from the following:   Height as of this encounter: 5\' 5"  (1.651 m).   Weight as of this encounter: 81.7 kg.  Code Status: DNR, comfort care DVT Prophylaxis:     Level of Care: Level of care: Med-Surg Family Communication:  Disposition Plan:      Remains inpatient appropriate: Awaiting placement   Procedures:    Consultants:   Palliative  medicine, pulmonary medicine  Antimicrobials:   Anti-infectives (From admission, onward)    Start     Dose/Rate Route Frequency Ordered Stop   08/24/22 1200  meropenem (MERREM) 1 g in sodium chloride 0.9 % 100 mL IVPB        1 g 200 mL/hr over 30 Minutes Intravenous Every 8 hours 08/24/22 1031 08/26/22 2112   08/21/22 1400  ceFEPIme (MAXIPIME) 2 g in sodium chloride 0.9 % 100 mL IVPB  Status:  Discontinued        2 g 200 mL/hr over 30 Minutes Intravenous Every 8 hours 08/21/22 1142 08/24/22 1031   08/19/22 2200  vancomycin (VANCOREADY) IVPB 1250 mg/250 mL        1,250 mg 166.7 mL/hr over 90 Minutes Intravenous Every 24 hours 08/18/22 2230 08/27/22 0035   08/19/22 1000  ceFEPIme (MAXIPIME) 2 g in sodium chloride 0.9 % 100 mL IVPB  Status:  Discontinued        2 g 200 mL/hr over 30 Minutes Intravenous Every 12 hours 08/18/22 2230 08/21/22 1141   08/19/22 0500  vancomycin (VANCOCIN) IVPB 1000 mg/200 mL premix  Status:  Discontinued        1,000 mg 200 mL/hr over 60 Minutes Intravenous  Once 08/19/22 0456 08/19/22 0457   08/19/22 0500  ceFEPIme (MAXIPIME) 2 g in sodium chloride 0.9 % 100 mL IVPB  Status:  Discontinued        2 g 200 mL/hr over 30 Minutes Intravenous  Once 08/19/22 0456 08/19/22 0458   08/18/22 2115  vancomycin (VANCOREADY) IVPB 1500 mg/300 mL        1,500 mg 150 mL/hr over 120 Minutes Intravenous  Once 08/18/22 2106 08/19/22 0033   08/18/22 2045  vancomycin (VANCOCIN) IVPB 1000 mg/200 mL premix  Status:  Discontinued        1,000 mg 200 mL/hr over 60 Minutes Intravenous  Once 08/18/22 2044 08/18/22 2105   08/18/22 2045  ceFEPIme (MAXIPIME) 2 g in sodium chloride 0.9 % 100 mL IVPB        2 g 200 mL/hr over 30 Minutes Intravenous  Once 08/18/22 2044 08/18/22 2210          Medications  Chlorhexidine Gluconate Cloth  6 each Topical Daily   dorzolamide  1 drop Both Eyes BID   DULoxetine  40 mg Oral Daily   fesoterodine  4 mg Oral Daily   latanoprost  1 drop Both  Eyes QHS   losartan  50 mg Oral Daily   nicotine  14 mg Transdermal Daily   pantoprazole  40 mg Oral BID   polyethylene glycol  17 g Oral Daily      Subjective:   Stephanie Benton was seen and examined today.  Appears to be comfortable, no acute distress.  No acute events overnight.   Objective:   Vitals:   09/09/22 2242 09/10/22 1323 09/10/22 2055 09/11/22 1101  BP: (!) 149/87 (!) 170/98 (!) 162/109   Pulse: 98 (!) 110 (!) 108  Resp: 16 18 15    Temp: 98.7 F (37.1 C) 98.1 F (36.7 C) 97.8 F (36.6 C)   TempSrc: Oral Oral Oral   SpO2: 97% 95% 100% 95%  Weight:      Height:        Intake/Output Summary (Last 24 hours) at 09/11/2022 1230 Last data filed at 09/11/2022 1000 Gross per 24 hour  Intake 60 ml  Output 800 ml  Net -740 ml     Wt Readings from Last 3 Encounters:  09/02/22 81.7 kg  08/17/22 73.9 kg  07/16/22 81.9 kg     Exam General: Somnolent, ill-appearing, not in any acute distress. Cardiovascular: S1 S2 auscultated,  RRR Respiratory: CTAB anteriorly Gastrointestinal: Soft, NT, ND, NBS Ext: no pedal edema bilaterally Neuro: unable to assess, not following any commands Skin: No rashes Psych:     Data Reviewed:  I have personally reviewed following labs    CBC Lab Results  Component Value Date   WBC 5.4 09/05/2022   RBC 2.58 (L) 09/05/2022   HGB 7.3 (L) 09/05/2022   HCT 24.1 (L) 09/05/2022   MCV 93.4 09/05/2022   MCH 28.3 09/05/2022   PLT 261 09/05/2022   MCHC 30.3 09/05/2022   RDW 15.9 (H) 09/05/2022   LYMPHSABS 1.0 09/04/2022   MONOABS 0.3 09/04/2022   EOSABS 0.3 09/04/2022   BASOSABS 0.1 35/70/1779     Last metabolic panel Lab Results  Component Value Date   NA 142 09/05/2022   K 3.3 (L) 09/05/2022   CL 115 (H) 09/05/2022   CO2 23 09/05/2022   BUN 5 (L) 09/05/2022   CREATININE 0.55 09/05/2022   GLUCOSE 78 09/05/2022   GFRNONAA >60 09/05/2022   GFRAA >60 10/11/2019   CALCIUM 8.1 (L) 09/05/2022   PHOS 2.8 09/04/2022   PROT  4.8 (L) 09/03/2022   ALBUMIN 1.6 (L) 09/03/2022   BILITOT 0.3 09/03/2022   ALKPHOS 78 09/03/2022   AST 23 09/03/2022   ALT 21 09/03/2022   ANIONGAP 4 (L) 09/05/2022    CBG (last 3)  No results for input(s): "GLUCAP" in the last 72 hours.    Coagulation Profile: No results for input(s): "INR", "PROTIME" in the last 168 hours.   Radiology Studies: I have personally reviewed the imaging studies  No results found.     Estill Cotta M.D. [facility] 09/11/2022, 12:30 PM  Available via Epic secure chat 7am-7pm After 7 pm, please refer to night coverage provider listed on amion.

## 2022-09-11 NOTE — TOC Progression Note (Signed)
Transition of Care [facility]) - Progression Note   Patient Details  Name: STARLYN DROGE MRN: [id number] Date of Birth: 1950/11/03  Transition of Care [facility]) CM/SW [place], LCSW Phone Number: 09/11/2022, 2:33 PM  Clinical Narrative: [facility] is reviewing the patient to see if the patient's Medicaid can be flipped to LTC Medicaid as [facility] is still bed locked. CSW updated patient's son that [facility] may be able to accept the patient. TOC awaiting bed at [facility] or for patient to be approved for [facility].  Expected Discharge Plan: [place] Barriers to Discharge: Continued Medical Work up  Expected Discharge Plan and Services In-house Referral: NA Discharge Planning Services: CM Consult Post Acute Care Choice: [place] Living arrangements for the past 2 months: [place] ([place]) Expected Discharge Date: 09/02/22               DME Arranged: N/A DME Agency: NA HH Arranged: NA HH Agency: NA  Social Determinants of Health (SDOH) Interventions SDOH Screenings   Food Insecurity: No Food Insecurity (08/19/2022)  Housing: Low Risk  (08/19/2022)  Recent Concern: Housing - Medium Risk (06/21/2022)  Transportation Needs: No Transportation Needs (08/19/2022)  Recent Concern: Transportation Needs - Unmet Transportation Needs (06/21/2022)  Utilities: Not At Risk (08/19/2022)  Tobacco Use: High Risk (08/25/2022)   Readmission Risk Interventions    09/02/2022   11:01 AM 07/22/2022   11:58 AM 07/18/2022    8:43 AM  Readmission Risk Prevention Plan  Transportation Screening Complete  Complete  Medication Review (RN Care Manager) Complete  Complete  PCP or Specialist appointment within 3-5 days of discharge Complete  Complete  HRI or Home Care Consult Complete  Complete  SW Recovery Care/Counseling Consult Complete  Complete  Palliative Care Screening   Not [place] Complete  Complete Not Applicable

## 2022-09-11 NOTE — Care Management Important Message (Signed)
Important Message  Patient Details IM Letter given. Name: Stephanie Benton MRN: [id number] Date of Birth: [birth year]   Medicare Important Message Given:  Yes     Kerin Salen 09/11/2022, 11:19 AM

## 2022-09-11 NOTE — Plan of Care (Signed)
  Problem: Pain Managment: Goal: General experience of comfort will improve Outcome: Progressing   Problem: Safety: Goal: Ability to remain free from injury will improve Outcome: Progressing   

## 2022-09-11 NOTE — Progress Notes (Signed)
Daily Progress Note   Patient Name: Stephanie Benton       Date: 09/11/2022 DOB: August 22, 1950  Age: 72 y.o. MRN#: [id number] Attending Physician: Mendel Corning, MD Primary Care Physician: [name], MD Admit Date: 08/18/2022 Length of Stay: 24 days  Reason for Consultation/Follow-up: Establishing goals of care and Symptom Management  Subjective:   CC:  Patient seen laying in bed today.  Following up regarding complex medical decision making and symptom management.  Subjective:  Patient continues to receive comfort focused care.   Chart reviewed  Noted medication history regarding pain and non pain symptom management needs.    TOC is working on LTC placement with hospice.     Patient seen resting comfortably in bed today when seen. Stephanie Benton complains of shortness of breath and also mild abdominal pain, talked with her about PRN PO Oxy IR.   Objective:   Vital Signs:  BP (!) 162/109 (BP Location: Right Arm)   Pulse (!) 108   Temp 97.8 F (36.6 C) (Oral)   Resp 15   Ht 5\' 5"  (1.651 m)   Wt 81.7 kg   SpO2 95%   BMI 29.97 kg/m   Physical Exam: General: NAD, laying in bed, resting Eyes: No drainage noted HENT: dry mucous membranes Cardiovascular: RRR Respiratory: no increased work of breathing noted, not in respiratory distress Abdomen: Soft Extremities: Weakness in all extremities Skin: no rashes or lesions on visible skin  Imaging:  I personally reviewed recent imaging.   Assessment & Plan:   Assessment: Patient is 72 year old female with a past medical history of COPD, bipolar disorder, hypertension, hyperlipidemia, and pancreatitis who was admitted on 08/18/2022 from rehab for management of increased dyspnea.  She had recently been hospitalized with pneumonia and COVID-19 from 1/9-1/13 and received appropriate medical care during that time.  During this prolonged hospitalization, has had imaging that has shown opacification of the left hemithorax suggestive of  mucous plugging.  Patient has undergone multiple interventions to assist with management.  Patient also had concern for possible GI bleed during hospitalization.  Patient remains in the ICU for aggressive medical care.  Despite multiple interventions, patient's respiratory status is not improving and left lung still showing opacification on chest x-ray.  PCCM has been following to assist with management.  Palliative medicine consulted to assist with complex medical decision making. Of note patient seen by palliative medicine providers during prior hospitalization.  Recommendations/Plan: # Complex medical decision making/goals of care:  - Patient was transitioned to comfort focused care on 09/05/2022 after discussion with patient's son, Stephanie Benton.  Continuing comfort focused care at this time. Son unable to provide hospice support at home; not currently eligible for inpatient hospice. TOC to assisting with long-term care placement with hospice.  Appreciate their support.  -  Code Status: DNR    -Pain/Dyspnea, acute on chronic in the setting of end-of-life care     prn oxycodone po/SL 5mg  q4hrs prn as initial medication for management as working on LTC placement with hospice.                           IV Dilaudid to 0.3 mg IV every 1 hour as needed for breakthrough secondary to oral medications.                   -Anxiety/agitation, in the setting of end-of-life care                               -  Continue po/SL Ativan 1 mg every 4 hours as needed. Continue to adjust based on patient's symptom burden.                                  -Continue SL/IV Haldol 0.5 mg every 4 hours as needed. IV to be for breakthrough management to orals. Continue to adjust based on patient's symptom burden.                   -Secretions, in the setting of end-of-life care                               - glycopyrrolate 1mg  oral q4hrs prn.    # Psycho-social/Spiritual Support:  - Support System: son, grandson,  daughter-in-law, sister  # Discharge Planning: chart reviewed, TOC following, appreciate input regarding  finding LTC with hospice.  Patient to continue with comfort focused care at this time.     Thank you for allowing the palliative care team to participate in the care Stephanie Benton.  Loistine Chance MD Palliative Care Provider PMT # [phone]  If patient remains symptomatic despite maximum doses, please call PMT at [phone] between 0700 and 1900. Outside of these hours, please call attending, as PMT does not have night coverage.

## 2022-09-12 DIAGNOSIS — A419 Sepsis, unspecified organism: Secondary | ICD-10-CM | POA: Diagnosis not present

## 2022-09-12 DIAGNOSIS — G9340 Encephalopathy, unspecified: Secondary | ICD-10-CM | POA: Diagnosis not present

## 2022-09-12 DIAGNOSIS — G9341 Metabolic encephalopathy: Secondary | ICD-10-CM | POA: Diagnosis not present

## 2022-09-12 DIAGNOSIS — J9601 Acute respiratory failure with hypoxia: Secondary | ICD-10-CM | POA: Diagnosis not present

## 2022-09-12 DIAGNOSIS — R6520 Severe sepsis without septic shock: Secondary | ICD-10-CM | POA: Diagnosis not present

## 2022-09-12 NOTE — TOC Progression Note (Signed)
Transition of Care [facility]) - Progression Note    Patient Details  Name: Stephanie Benton MRN: [id number] Date of Birth: [birth year]  Transition of Care [facility]) CM/SW Contact  Joaquin Courts, RN Phone Number: 09/12/2022, 2:28 PM  Clinical Narrative:     CM presented son with new bed offers, [facility] and [facility].  Son selects [facility], facility rep notified.  Awaiting room and board rate from facility to finalized paperwork as well as bed assignment.   Expected Discharge Plan: [place] Barriers to Discharge: Continued Medical Work up  Expected Discharge Plan and Services In-house Referral: NA Discharge Planning Services: CM Consult Post Acute Care Choice: [place] Living arrangements for the past 2 months: [place] ([place]) Expected Discharge Date: 09/02/22               DME Arranged: N/A DME Agency: NA       HH Arranged: NA HH Agency: NA         Social Determinants of Health (SDOH) Interventions SDOH Screenings   Food Insecurity: No Food Insecurity (08/19/2022)  Housing: Low Risk  (08/19/2022)  Recent Concern: Housing - Medium Risk (06/21/2022)  Transportation Needs: No Transportation Needs (08/19/2022)  Recent Concern: Transportation Needs - Unmet Transportation Needs (06/21/2022)  Utilities: Not At Risk (08/19/2022)  Tobacco Use: High Risk (08/25/2022)    Readmission Risk Interventions    09/02/2022   11:01 AM 07/22/2022   11:58 AM 07/18/2022    8:43 AM  Readmission Risk Prevention Plan  Transportation Screening Complete  Complete  Medication Review (RN Care Manager) Complete  Complete  PCP or Specialist appointment within 3-5 days of discharge Complete  Complete  HRI or Home Care Consult Complete  Complete  SW Recovery Care/Counseling Consult Complete  Complete  Palliative Care Screening   Not [place] Complete Complete Not Applicable

## 2022-09-12 NOTE — Plan of Care (Signed)
  Problem: Respiratory: Goal: Will maintain a patent airway Outcome: Progressing Goal: Complications related to the disease process, condition or treatment will be avoided or minimized Outcome: Progressing   Problem: Nutrition: Goal: Adequate nutrition will be maintained Outcome: Progressing

## 2022-09-12 NOTE — TOC Progression Note (Signed)
Transition of Care [facility]) - Progression Note   Patient Details  Name: Stephanie Benton MRN: [id number] Date of Birth: 1951-03-13  Transition of Care [facility]) CM/SW [place], LCSW Phone Number: 09/12/2022, 12:22 PM  Clinical Narrative: CSW followed up with Kenney Houseman at [facility] and the facility will be bed locked until at least next week.  Expected Discharge Plan: [place] Barriers to Discharge: Continued Medical Work up  Expected Discharge Plan and Services In-house Referral: NA Discharge Planning Services: CM Consult Post Acute Care Choice: [place] Living arrangements for the past 2 months: [place] ([place]) Expected Discharge Date: 09/02/22               DME Arranged: N/A DME Agency: NA HH Arranged: NA HH Agency: NA  Social Determinants of Health (SDOH) Interventions SDOH Screenings   Food Insecurity: No Food Insecurity (08/19/2022)  Housing: Low Risk  (08/19/2022)  Recent Concern: Housing - Medium Risk (06/21/2022)  Transportation Needs: No Transportation Needs (08/19/2022)  Recent Concern: Transportation Needs - Unmet Transportation Needs (06/21/2022)  Utilities: Not At Risk (08/19/2022)  Tobacco Use: High Risk (08/25/2022)   Readmission Risk Interventions    09/02/2022   11:01 AM 07/22/2022   11:58 AM 07/18/2022    8:43 AM  Readmission Risk Prevention Plan  Transportation Screening Complete  Complete  Medication Review (RN Care Manager) Complete  Complete  PCP or Specialist appointment within 3-5 days of discharge Complete  Complete  HRI or Home Care Consult Complete  Complete  SW Recovery Care/Counseling Consult Complete  Complete  Palliative Care Screening   Not [place] Complete Complete Not Applicable

## 2022-09-12 NOTE — Progress Notes (Signed)
Daily Progress Note   Patient Name: Stephanie Benton       Date: 09/12/2022 DOB: 05/18/1951  Age: 72 y.o. MRN#: [id number] Attending Physician: Mendel Corning, MD Primary Care Physician: [name], MD Admit Date: 08/18/2022 Length of Stay: 25 days  Reason for Consultation/Follow-up: Establishing goals of care and Symptom Management  Subjective:   CC:  Patient seen laying in bed today.  Following up regarding complex medical decision making and symptom management.  Subjective:  Patient continues to receive comfort focused care. Resting comfortably, no distress.   Chart reviewed  Noted medication history regarding pain and non pain symptom management needs.     TOC note reviewed. Likely to go to [facility] rehab.      Objective:   Vital Signs:  BP (!) 179/96 (BP Location: Right Arm)   Pulse 98   Temp 97.9 F (36.6 C) (Oral)   Resp 16   Ht 5\' 5"  (1.651 m)   Wt 81.7 kg   SpO2 99%   BMI 29.97 kg/m   Physical Exam: General: NAD, laying in bed, resting Eyes: No drainage noted HENT: dry mucous membranes Cardiovascular: RRR Respiratory: no increased work of breathing noted, not in respiratory distress Abdomen: Soft Extremities: Weakness in all extremities Skin: no rashes or lesions on visible skin  Imaging:  I personally reviewed recent imaging.   Assessment & Plan:   Assessment: Patient is 72 year old female with a past medical history of COPD, bipolar disorder, hypertension, hyperlipidemia, and pancreatitis who was admitted on 08/18/2022 from rehab for management of increased dyspnea.  She had recently been hospitalized with pneumonia and COVID-19 from 1/9-1/13 and received appropriate medical care during that time.  During this prolonged hospitalization, has had imaging that has shown opacification of the left hemithorax suggestive of mucous plugging.  Patient has undergone multiple interventions to assist with management.  Patient also had concern for possible GI bleed  during hospitalization.  Patient remains in the ICU for aggressive medical care.  Despite multiple interventions, patient's respiratory status is not improving and left lung still showing opacification on chest x-ray.  PCCM has been following to assist with management.  Palliative medicine consulted to assist with complex medical decision making. Of note patient seen by palliative medicine providers during prior hospitalization.  Recommendations/Plan: # Complex medical decision making/goals of care:  - Patient was transitioned to comfort focused care on 09/05/2022 after discussion with patient's son, Stephanie Benton.  Continuing comfort focused care at this time. Son unable to provide hospice support at home; not currently eligible for inpatient hospice. TOC to assisting with long-term care placement with hospice.  Appreciate their support.  -  Code Status: DNR    -Pain/Dyspnea, acute on chronic in the setting of end-of-life care     prn oxycodone po/SL 5mg  q4hrs prn as initial medication for management as working on LTC placement with hospice.                                          -Anxiety/agitation, in the setting of end-of-life care                               -Continue po/SL Ativan 1 mg every 4 hours as needed. Continue to adjust based on patient's symptom burden.                                  -  Continue SL/IV Haldol 0.5 mg every 4 hours as needed. IV to be for breakthrough management to orals. Continue to adjust based on patient's symptom burden.                   -Secretions, in the setting of end-of-life care                               - glycopyrrolate 1mg  oral q4hrs prn.    # Psycho-social/Spiritual Support:  - Support System: son, grandson, daughter-in-law, sister  # Discharge Planning: chart reviewed, TOC following,   Patient to continue with comfort focused care at this time.     Thank you for allowing the palliative care team to participate in the care [facility].  Loistine Chance MD Palliative Care Provider PMT # [phone]  If patient remains symptomatic despite maximum doses, please call PMT at [phone] between 0700 and 1900. Outside of these hours, please call attending, as PMT does not have night coverage.

## 2022-09-12 NOTE — Progress Notes (Signed)
[facility]                                                                              Stephanie Benton, is a 72 y.o. female, DOB - July 26, 1951, [id number] Admit date - 08/18/2022    Outpatient Primary MD for the patient is Paliwal, Himanshu, MD  LOS - 25  days  Chief Complaint  Patient presents with   Shortness of Breath       Brief summary  72 year old with history of COPD, pancreatitis, bipolar admitted from SNF for increasing dyspnea.  Initially patient discharged on 1/13 after treated for COVID pneumonia and discharged on Omnicef.  Following day patient returned with hypoxia requiring 3-5 L nasal cannula with leukocytosis.  Initially admitted to the ICU on empiric antibiotics, hypertonic saline.  Patient was found to have left-sided hemithorax with opacification therefore PCCM was consulted.  There is also a drop in hemoglobin never required 2 units of PRBC transfusion.  Bronchoscopy performed on 1/17, removed thick mucous plug from the left mainstem and failed extubation requiring ICU transfer.  GI was also consulted due to drop in hemoglobin.  Antibiotics were broadened to 1/22 meropenem.  Eventually extubated.  Since then patient has had off-and-on worsening left lower lobe consolidation and again complete left-sided opacification on 1/30.   Significant Events: 1/15 presented with L hemithorax opacification and dyspnea, PCCM consult 1/16 more somnolent with Hgb drop,CXR without improvement in L-sided opacification, transfused 2 units PRBC for hemoglobin 5.5 --> 10 1/17 bronchoscopy with removal of thick mucous plugs from left mainstem and segmental bronchi, failed extubation postprocedure and transferred to ICU 1/18 Concern for GIB, Hgb drop 5.5, GI consulted. Remains on vent 1/20 antibiotics broadened to meropenem due to fevers and leukocytosis 1/21 Remains on vent, off pressors / precedex, more responsive 1/22 extubated 2/1-transition to comfort care Son unable  to take care of the patient therefore [place] working on LTC placement with hospice    Assessment & Plan    Acute respiratory failure with hypoxia requiring mechanical ventilation  S/p bronchoscopy to the left hemithorax due to atelectasis and mucous plugging Recurrent aspiration -Patient has completed a course of vancomycin and meropenem -Concern for recurrent mucous plugging, currently comfort care -Awaiting placement    COVID-19 positive: -Patient completed isolation on 9/37/1696    Acute metabolic encephalopathy: -Continue supportive care  -Alert and oriented today, no acute issues -Continue comfort care   Acute blood loss anemia: - Status post 2 units of packed red blood cells due to black tarry stools on 08/22/2018. -EGD on 1/19 had shown gastritis.  -Patient was placed on PPI, iron supplementation, now comfort care     Hypokalemia/hypophosphatemia: -Was replaced as needed, now comfort care   Essential hypertension - Not on any antihypertensives, comfort care   Severe protein malnutrition/dysphagia: - Off tube feedings.  -Continue diet as tolerated, comfort care    Significant muscular deconditioning: PT OT recommended SNF  -TOC assisting on LTC placement with hospice  Pressure Injury Documentation: Left buttocks medial stage II, POA -Wound care per nursing  Moderate protein calorie malnutrition Nutrition Problem: Moderate Malnutrition Etiology: chronic illness (chronic pancreatitis, COPD)  Signs/Symptoms: moderate fat depletion, moderate muscle depletion Interventions: Tube feeding, Refer to RD note for recommendations Estimated body mass index is 29.97 kg/m as calculated from the following:   Height as of this encounter: 5\' 5"  (1.651 m).   Weight as of this encounter: 81.7 kg.  Code Status: DNR, comfort care DVT Prophylaxis:     Level of Care: Level of care: Med-Surg Family Communication:  Disposition Plan:      Remains inpatient appropriate: Awaiting  placement   Procedures:    Consultants:   Palliative medicine, pulmonary medicine  Antimicrobials:   Anti-infectives (From admission, onward)    Start     Dose/Rate Route Frequency Ordered Stop   08/24/22 1200  meropenem (MERREM) 1 g in sodium chloride 0.9 % 100 mL IVPB        1 g 200 mL/hr over 30 Minutes Intravenous Every 8 hours 08/24/22 1031 08/26/22 2112   08/21/22 1400  ceFEPIme (MAXIPIME) 2 g in sodium chloride 0.9 % 100 mL IVPB  Status:  Discontinued        2 g 200 mL/hr over 30 Minutes Intravenous Every 8 hours 08/21/22 1142 08/24/22 1031   08/19/22 2200  vancomycin (VANCOREADY) IVPB 1250 mg/250 mL        1,250 mg 166.7 mL/hr over 90 Minutes Intravenous Every 24 hours 08/18/22 2230 08/27/22 0035   08/19/22 1000  ceFEPIme (MAXIPIME) 2 g in sodium chloride 0.9 % 100 mL IVPB  Status:  Discontinued        2 g 200 mL/hr over 30 Minutes Intravenous Every 12 hours 08/18/22 2230 08/21/22 1141   08/19/22 0500  vancomycin (VANCOCIN) IVPB 1000 mg/200 mL premix  Status:  Discontinued        1,000 mg 200 mL/hr over 60 Minutes Intravenous  Once 08/19/22 0456 08/19/22 0457   08/19/22 0500  ceFEPIme (MAXIPIME) 2 g in sodium chloride 0.9 % 100 mL IVPB  Status:  Discontinued        2 g 200 mL/hr over 30 Minutes Intravenous  Once 08/19/22 0456 08/19/22 0458   08/18/22 2115  vancomycin (VANCOREADY) IVPB 1500 mg/300 mL        1,500 mg 150 mL/hr over 120 Minutes Intravenous  Once 08/18/22 2106 08/19/22 0033   08/18/22 2045  vancomycin (VANCOCIN) IVPB 1000 mg/200 mL premix  Status:  Discontinued        1,000 mg 200 mL/hr over 60 Minutes Intravenous  Once 08/18/22 2044 08/18/22 2105   08/18/22 2045  ceFEPIme (MAXIPIME) 2 g in sodium chloride 0.9 % 100 mL IVPB        2 g 200 mL/hr over 30 Minutes Intravenous  Once 08/18/22 2044 08/18/22 2210          Medications  Chlorhexidine Gluconate Cloth  6 each Topical Daily   dorzolamide  1 drop Both Eyes BID   latanoprost  1 drop Both Eyes  QHS   nicotine  14 mg Transdermal Daily   pantoprazole  40 mg Oral BID   polyethylene glycol  17 g Oral Daily      Subjective:   Keelynn Furgerson was seen and examined today.  Alert and oriented, pleasant, no acute distress.  No fevers, chest pain or shortness of breath.  NAD   Objective:   Vitals:   09/10/22 2055 09/11/22 1101 09/11/22 1349 09/11/22 2150  BP: (!) 162/109  (!) 170/89 (!) 171/78  Pulse: (!) 108  (!) 108 (!) 101  Resp: 15  16 15   Temp:  97.8 F (36.6 C)  97.7 F (36.5 C) 97.8 F (36.6 C)  TempSrc: Oral  Oral Oral  SpO2: 100% 95% 100% 100%  Weight:      Height:        Intake/Output Summary (Last 24 hours) at 09/12/2022 1217 Last data filed at 09/12/2022 1100 Gross per 24 hour  Intake 720 ml  Output 1100 ml  Net -380 ml     Wt Readings from Last 3 Encounters:  09/02/22 81.7 kg  08/17/22 73.9 kg  07/16/22 81.9 kg   Physical Exam General: Alert and oriented, NAD Cardiovascular: S1 S2 clear, RRR.  Respiratory: CTAB, no wheezing, rales or rhonchi Gastrointestinal: Soft, nontender, nondistended, NBS Ext: no pedal edema bilaterally Neuro: no new deficits Skin: No rashes Psych: Normal affect    Data Reviewed:  I have personally reviewed following labs    CBC Lab Results  Component Value Date   WBC 5.4 09/05/2022   RBC 2.58 (L) 09/05/2022   HGB 7.3 (L) 09/05/2022   HCT 24.1 (L) 09/05/2022   MCV 93.4 09/05/2022   MCH 28.3 09/05/2022   PLT 261 09/05/2022   MCHC 30.3 09/05/2022   RDW 15.9 (H) 09/05/2022   LYMPHSABS 1.0 09/04/2022   MONOABS 0.3 09/04/2022   EOSABS 0.3 09/04/2022   BASOSABS 0.1 81/82/9937     Last metabolic panel Lab Results  Component Value Date   NA 142 09/05/2022   K 3.3 (L) 09/05/2022   CL 115 (H) 09/05/2022   CO2 23 09/05/2022   BUN 5 (L) 09/05/2022   CREATININE 0.55 09/05/2022   GLUCOSE 78 09/05/2022   GFRNONAA >60 09/05/2022   GFRAA >60 10/11/2019   CALCIUM 8.1 (L) 09/05/2022   PHOS 2.8 09/04/2022   PROT 4.8 (L)  09/03/2022   ALBUMIN 1.6 (L) 09/03/2022   BILITOT 0.3 09/03/2022   ALKPHOS 78 09/03/2022   AST 23 09/03/2022   ALT 21 09/03/2022   ANIONGAP 4 (L) 09/05/2022    CBG (last 3)  No results for input(s): "GLUCAP" in the last 72 hours.    Coagulation Profile: No results for input(s): "INR", "PROTIME" in the last 168 hours.   Radiology Studies: I have personally reviewed the imaging studies  No results found.     Estill Cotta M.D. [facility] 09/12/2022, 12:17 PM  Available via Epic secure chat 7am-7pm After 7 pm, please refer to night coverage provider listed on amion.

## 2022-09-13 DIAGNOSIS — A419 Sepsis, unspecified organism: Secondary | ICD-10-CM | POA: Diagnosis not present

## 2022-09-13 DIAGNOSIS — J9601 Acute respiratory failure with hypoxia: Secondary | ICD-10-CM | POA: Diagnosis not present

## 2022-09-13 DIAGNOSIS — G9340 Encephalopathy, unspecified: Secondary | ICD-10-CM | POA: Diagnosis not present

## 2022-09-13 DIAGNOSIS — G9341 Metabolic encephalopathy: Secondary | ICD-10-CM | POA: Diagnosis not present

## 2022-09-13 NOTE — TOC Transition Note (Signed)
Transition of Care [facility]) - CM/SW Discharge Note  Patient Details  Name: Stephanie Benton MRN: [id number] Date of Birth: 21-Dec-1950  Transition of Care [facility]) CM/SW Contact:  Sherie Don, LCSW Phone Number: 09/13/2022, 12:42 PM  Clinical Narrative: [facility] has accepted the patient for LTC. CSW made hospice referral to [facility] with [facility]. Hospice to follow the patient at the facility.  Patient will go to room 304A and the number for report is [phone]. FL2 done. Discharge summary, discharge orders, SNF transfer report, and FL2 faxed to facility in hub. Medical necessity form done; PTAR scheduled. Discharge packet completed. Son notified of discharge. RN updated. TOC signing off.   Final next level of care: Skilled Nursing Facility Barriers to Discharge: Barriers Resolved  Patient Goals and CMS Choice CMS Medicare.gov Compare Post Acute Care list provided to:: Patient Represenative (must comment) Talbert Nan (son)) Choice offered to / list presented to : Adult Children  Discharge Placement         Patient chooses bed at: [facility] Patient to be transferred to facility by: [place] Name of family member notified: Cissy Sasseen (son) Patient and family notified of of transfer: 09/13/22  Discharge Plan and Services Additional resources added to the After Visit Summary for   In-house Referral: NA Discharge Planning Services: CM Consult Post Acute Care Choice: [place]          DME Arranged: N/A DME Agency: NA HH Arranged: NA HH Agency: NA  Social Determinants of Health ([place]) Interventions SDOH Screenings   Food Insecurity: No Food Insecurity (08/19/2022)  Housing: Low Risk  (08/19/2022)  Recent Concern: Housing - Medium Risk (06/21/2022)  Transportation Needs: No Transportation Needs (08/19/2022)  Recent Concern: Transportation Needs - Unmet Transportation Needs (06/21/2022)  Utilities: Not At Risk (08/19/2022)   Tobacco Use: High Risk (08/25/2022)   Readmission Risk Interventions    09/02/2022   11:01 AM 07/22/2022   11:58 AM 07/18/2022    8:43 AM  Readmission Risk Prevention Plan  Transportation Screening Complete  Complete  Medication Review ([place]) Complete  Complete  PCP or Specialist appointment within 3-5 days of discharge Complete  Complete  HRI or Home Care Consult Complete  Complete  SW Recovery Care/Counseling Consult Complete  Complete  Palliative Care Screening   Not [place] Complete Complete Not Applicable

## 2022-09-13 NOTE — NC FL2 (Signed)
[place] LEVEL OF CARE FORM     IDENTIFICATION  Patient Name: Stephanie Benton Birthdate: 1951/07/23 Sex: female Admission Date (Current Location): 08/18/2022  [place] and [place] Number:  Stephanie Benton [id number] [place] and Address:  [facility],  [place], [place]      Provider Number: [id number]  Attending Physician Name and Address:  Mendel Corning, MD  Relative Name and Phone Number:  Stephanie Benton (son) Ph: [phone]    Current Level of Care: Hospital Recommended Level of Care: [place] Prior Approval Number:    Date Approved/Denied:   PASRR Number:    Discharge Plan: SNF (LTC at [facility] with hospice)    Current Diagnoses: Patient Active Problem List   Diagnosis Date Noted   Counseling and coordination of care 09/06/2022   Palliative care encounter 09/05/2022   Goals of care, counseling/discussion 09/05/2022   Need for emotional support 09/05/2022   High risk medication use 09/05/2022   Pain 09/05/2022   Dyspnea 09/05/2022   Sepsis with acute organ dysfunction ([place]) 09/05/2022   Frailty 09/05/2022   Physical deconditioning 09/05/2022   Respiratory insufficiency 09/04/2022   Debility 09/04/2022   Aspiration into airway 09/04/2022   Dysphagia 09/03/2022   Muscular deconditioning 09/03/2022   Acute respiratory failure with hypoxia ([place]) 09/03/2022   Left lower lobe pulmonary infiltrate 09/02/2022   Mucus plug in respiratory tract 09/02/2022   Acute encephalopathy 09/02/2022   Pressure injury of skin 09/02/2022   Atelectasis of left lung 08/21/2022   Acute hypoxic respiratory failure ([place]) 08/21/2022   Severe sepsis with acute organ dysfunction ([place]) 08/18/2022   HCAP (healthcare-associated pneumonia) 08/13/2022   Tobacco abuse 08/13/2022   Malnutrition of moderate degree 07/17/2022   Severe sepsis ([place]) 07/16/2022   Hypothermia 07/16/2022   Rhabdomyolysis 123456   Acute  metabolic encephalopathy 123456   Elevated liver enzymes 07/16/2022   Acute on chronic pancreatitis ([place]) 06/23/2022   Hypokalemia 06/21/2022   COPD (chronic obstructive pulmonary disease) ([place]) 06/21/2022   Glaucoma 06/21/2022   Abdominal pain, chronic, epigastric    LFTs abnormal    Pancreatitis 123XX123   Metabolic acidosis 123XX123   Esophagitis determined by endoscopy 05/17/2022   Dehydration 05/16/2022   Sleep disturbance 08/21/2021   Spondylolisthesis at L5-S1 level 04/25/2021   Primary osteoarthritis of left knee 05/12/2018   Insomnia due to mental condition 05/08/2018   Chronic bilateral low back pain 03/25/2018   Tobacco abuse counseling 02/18/2018   Hyperkalemia 02/18/2018   Chest pain 02/17/2018   COPD with acute exacerbation ([place]) 02/17/2018   Hypertension 02/17/2018   Depression with anxiety 02/17/2018   AKI (acute kidney injury) ([place])    Anemia    Bipolar disorder, in partial remission, most recent episode mixed ([place]) 07/04/2017   Osteopenia of multiple sites 07/04/2017   Bipolar disorder, in full remission, most recent episode mixed ([place]) 07/04/2017   Bipolar disorder, in partial remission, most recent episode depressed ([place]) 07/04/2017   Cigarette smoker 06/12/2017   Bilateral hip pain 05/01/2017   B12 deficiency 03/04/2017   Spondylosis of lumbar region without myelopathy or radiculopathy 08/08/2016   Risk for falls 05/06/2016   Hypercholesterolemia 02/21/2016    Orientation RESPIRATION BLADDER Height & Weight     Self, Place  Normal Incontinent Weight: 180 lb 1.9 oz (81.7 kg) Height:  5' 5"$  (165.1 cm)  BEHAVIORAL SYMPTOMS/MOOD NEUROLOGICAL BOWEL NUTRITION STATUS   (N/A)  (N/A) Incontinent Diet (Regular diet)  AMBULATORY STATUS COMMUNICATION OF NEEDS  Skin   Extensive Assist Verbally Other (Comment) (Ecchymosis: bilateral arms, hand; Erythema: right arm; Weeping: right hand)                       Personal Care Assistance Level of  Assistance  Bathing, Feeding, Dressing Bathing Assistance: Maximum assistance Feeding assistance: Maximum assistance Dressing Assistance: Maximum assistance     Functional Limitations Info  Sight, Hearing, Speech Sight Info: Impaired Hearing Info: Adequate Speech Info: Adequate    SPECIAL CARE FACTORS FREQUENCY                       Contractures Contractures Info: Not present    Additional Factors Info  Code Status, Psychotropic, Allergies Code Status Info: DNR Allergies Info: Fish Allergy, Morphine And Related, Belsomra (Suvorexant), Codeine, Duragesic-100 (Fentanyl), Motrin (Ibuprofen), Nsaids, Penicillins, Sulfa Antibiotics, Tolectin (Tolmetin) Psychotropic Info: Haldol, Desyrel         Current Medications (09/13/2022):  This is the current hospital active medication list Current Facility-Administered Medications  Medication Dose Route Frequency Provider Last Rate Last Admin   acetaminophen (TYLENOL) tablet 650 mg  650 mg Oral Q6H PRN Amin, Ankit Chirag, MD       antiseptic oral rinse (BIOTENE) solution 15 mL  15 mL Topical PRN Mims, Lauren W, DO       Chlorhexidine Gluconate Cloth 2 % PADS 6 each  6 each Topical Daily Charlynne Cousins, MD   6 each at 09/12/22 2200   dorzolamide (TRUSOPT) 2 % ophthalmic solution 1 drop  1 drop Both Eyes BID Charlynne Cousins, MD   1 drop at 09/13/22 1050   glycopyrrolate (ROBINUL) tablet 1 mg  1 mg Oral Q4H PRN Mims, Lauren W, DO       haloperidol (HALDOL) 2 MG/ML solution 0.5 mg  0.5 mg Sublingual Q4H PRN Mims, Lauren W, DO       Or   haloperidol lactate (HALDOL) injection 0.5 mg  0.5 mg Intravenous Q4H PRN Mims, Lauren W, DO       HYDROmorphone (DILAUDID) injection 0.5 mg  0.5 mg Intravenous Q1H PRN Amin, Ankit Chirag, MD   0.5 mg at 09/13/22 1058   ipratropium-albuterol (DUONEB) 0.5-2.5 (3) MG/3ML nebulizer solution 3 mL  3 mL Nebulization Q4H PRN Amin, Ankit Chirag, MD   3 mL at 09/11/22 1101   latanoprost (XALATAN) 0.005 %  ophthalmic solution 1 drop  1 drop Both Eyes QHS Charlynne Cousins, MD   1 drop at 09/12/22 2151   loperamide HCl (IMODIUM) 1 MG/7.5ML suspension 2 mg  2 mg Oral PRN Charlynne Cousins, MD       LORazepam (ATIVAN) 2 MG/ML concentrated solution 1 mg  1 mg Sublingual Q4H PRN Mims, Lauren W, DO       Or   LORazepam (ATIVAN) 2 MG/ML concentrated solution 1 mg  1 mg Oral Q4H PRN Mims, Lauren W, DO       nicotine (NICODERM CQ - dosed in mg/24 hours) patch 14 mg  14 mg Transdermal Daily Charlynne Cousins, MD   14 mg at 09/13/22 1043   ondansetron (ZOFRAN) tablet 4 mg  4 mg Oral Q6H PRN Charlynne Cousins, MD       Or   ondansetron [facility]) injection 4 mg  4 mg Intravenous Q6H PRN Charlynne Cousins, MD   4 mg at 08/27/22 1934   Oral care mouth rinse  15 mL Mouth Rinse PRN Charlynne Cousins,  MD       oxyCODONE (ROXICODONE INTENSOL) 20 MG/ML concentrated solution 5 mg  5 mg Oral Q4H PRN Mims, Lauren W, DO   5 mg at 09/10/22 2037   Or   oxyCODONE (ROXICODONE INTENSOL) 20 MG/ML concentrated solution 5 mg  5 mg Sublingual Q4H PRN Terrilee Files, DO   5 mg at 09/12/22 2149   pantoprazole (PROTONIX) EC tablet 40 mg  40 mg Oral BID Charlynne Cousins, MD   40 mg at 09/13/22 1051   polyethylene glycol (MIRALAX / GLYCOLAX) packet 17 g  17 g Oral Daily Charlynne Cousins, MD   17 g at 09/13/22 1054   polyvinyl alcohol (LIQUIFILM TEARS) 1.4 % ophthalmic solution 1 drop  1 drop Both Eyes QID PRN Mims, Lauren W, DO       senna-docusate (Senokot-S) tablet 1 tablet  1 tablet Oral QHS PRN Amin, Jeanella Flattery, MD       traZODone (DESYREL) tablet 50 mg  50 mg Oral QHS PRN Damita Lack, MD   50 mg at 09/11/22 2247     Discharge Medications: Please see discharge summary for a list of discharge medications.  Relevant Imaging Results:  Relevant Lab Results:   Additional Information SSN: [id number]  Stephanie Don, LCSW

## 2022-09-13 NOTE — Discharge Summary (Signed)
Physician Discharge Summary   Patient: Stephanie Benton MRN: [id number] DOB: [birth year]  Admit date:     08/18/2022  Discharge date: 09/13/22  Discharge Physician: Estill Cotta, MD    PCP: [name], MD   Recommendations at discharge:   Comfort care, DNR, DNI  Discharge Diagnoses:    Severe sepsis with acute organ dysfunction ([place])   Acute respiratory failure with hypoxia ([place])   Hypertension   Hypercholesterolemia   Bipolar disorder, in full remission, most recent episode mixed ([place])  Transaminitis   Hypokalemia   COPD (chronic obstructive pulmonary disease) ([place])   Acute metabolic encephalopathy   HCAP (healthcare-associated pneumonia), aspiration PNA   Tobacco abuse   Atelectasis of left lung,  Mucus plug in respiratory tract    Pressure injury of skin   Dysphagia   Physical deconditioning    Hospital Course:  72 year old with history of COPD, pancreatitis, bipolar admitted from SNF for increasing dyspnea.  Initially patient discharged on 1/13 after treated for COVID pneumonia and discharged on Omnicef.  Following day patient returned with hypoxia requiring 3-5 L nasal cannula with leukocytosis.  Initially admitted to the ICU on empiric antibiotics, hypertonic saline.  Patient was found to have left-sided hemithorax with opacification therefore PCCM was consulted.  There is also a drop in hemoglobin never required 2 units of PRBC transfusion.  Bronchoscopy performed on 1/17, removed thick mucous plug from the left mainstem and failed extubation requiring ICU transfer.  GI was also consulted due to drop in hemoglobin.  Antibiotics were broadened to 1/22 meropenem.  Eventually extubated.  Since then patient has had off-and-on worsening left lower lobe consolidation and again complete left-sided opacification on 1/30.   Significant Events: 1/15 presented with L hemithorax opacification and dyspnea, PCCM consult 1/16 more somnolent with Hgb drop,CXR without improvement in  L-sided opacification, transfused 2 units PRBC for hemoglobin 5.5 --> 10 1/17 bronchoscopy with removal of thick mucous plugs from left mainstem and segmental bronchi, failed extubation postprocedure and transferred to ICU 1/18 Concern for GIB, Hgb drop 5.5, GI consulted. Remains on vent 1/20 antibiotics broadened to meropenem due to fevers and leukocytosis 1/21 Remains on vent, off pressors / precedex, more responsive 1/22 extubated 2/1-transition to comfort care  Assessment and Plan:   Acute respiratory failure with hypoxia requiring mechanical ventilation  S/p bronchoscopy to the left hemithorax due to atelectasis and mucous plugging Recurrent aspiration -Patient has completed a course of vancomycin and meropenem -Concern for recurrent mucous plugging, currently comfort care     COVID-19 positive: -Patient completed isolation on [id number]    Acute metabolic encephalopathy: -Continue supportive care  --Continue comfort care   Acute blood loss anemia: - Status post 2 units of packed red blood cells due to black tarry stools on 08/22/2018. -EGD on 1/19 had shown gastritis.  -Patient was placed on PPI, iron supplementation    Hypokalemia/hypophosphatemia: -Was replaced as needed   Essential hypertension - Continue Coreg, losartan   Severe protein malnutrition/dysphagia: - Off tube feedings.  -Continue diet as tolerated, comfort care    Significant muscular deconditioning: PT OT recommended SNF    Pressure Injury Documentation: Left buttocks medial stage II, POA -Wound care per nursing   Moderate protein calorie malnutrition Nutrition Problem: Moderate Malnutrition Etiology: chronic illness (chronic pancreatitis, COPD) Signs/Symptoms: moderate fat depletion, moderate muscle depletion Estimated body mass index is 29.97 kg/m as calculated from the following:   Height as of this encounter: 5' 5"$  (1.651 m).   Weight as of this  encounter: 81.7 kg.   Code Status: DNR,  comfort care     Pain control - [facility] Controlled Substance Reporting System database was reviewed. and patient was instructed, not to drive, operate heavy machinery, perform activities at heights, swimming or participation in water activities or provide baby-sitting services while on Pain, Sleep and Anxiety Medications; until their outpatient Physician has advised to do so again. Also recommended to not to take more than prescribed Pain, Sleep and Anxiety Medications.  Consultants: Palliative medicine, pulmonary medicine Procedures performed:   Disposition: Skilled nursing facility Diet recommendation:  Discharge Diet Orders (From admission, onward)     Start     Ordered   09/02/22 0000  Diet - low sodium heart healthy        09/02/22 0957            DISCHARGE MEDICATION: Allergies as of 09/13/2022       Reactions   Fish Allergy Anaphylaxis   Morphine And Related Anaphylaxis, Swelling   Belsomra [suvorexant] Other (See Comments)   Nightmares    Codeine Nausea And Vomiting   Duragesic-100 [fentanyl] Hives   Motrin [ibuprofen] Nausea Only, Other (See Comments)   History of stomach ulcers   Nsaids Other (See Comments)   History of stomach ulcers   Penicillins Hives   Did it involve swelling of the face/tongue/throat, SOB, or low BP? Yes Did it involve sudden or severe rash/hives, skin peeling, or any reaction on the inside of your mouth or nose? Yes Did you need to seek medical attention at a hospital or doctor's office? Yes When did it last happen? More than 10 years ago If all above answers are "NO", may proceed with cephalosporin use.   Sulfa Antibiotics Swelling   Not listed on MAR   Tolectin [tolmetin] Other (See Comments)   History of stomach ulcers Not listed on MAR        Medication List     STOP taking these medications    ascorbic acid 500 MG tablet Commonly known as: VITAMIN C   cefdinir 300 MG capsule Commonly known as: OMNICEF   Ensure  Original Liqd   multivitamin with minerals tablet   potassium chloride 10 MEQ tablet Commonly known as: KLOR-CON       TAKE these medications    albuterol (2.5 MG/3ML) 0.083% nebulizer solution Commonly known as: PROVENTIL Take 2.5 mg by nebulization every 6 (six) hours as needed for wheezing or shortness of breath.   budesonide-formoterol 160-4.5 MCG/ACT inhaler Commonly known as: SYMBICORT Inhale 2 puffs into the lungs every 12 (twelve) hours as needed (wheezing).   carvedilol 25 MG tablet Commonly known as: COREG Take 1 tablet (25 mg total) by mouth 2 (two) times daily with a meal.   COLLAGEN EX Apply 1 application  topically See admin instructions. Cleanse right knee, pat dry, apply collagen and cover with silicone dressing every Monday, Wednesday, Friday during day shift.   dorzolamide 2 % ophthalmic solution Commonly known as: TRUSOPT Place 1 drop into both eyes 2 (two) times daily. (1000, 1800)   DULoxetine HCl 40 MG Csdr Take 40 mg by mouth in the morning. (1000)   ferrous sulfate 325 (65 FE) MG tablet Take 325 mg by mouth 2 (two) times daily. (123XX123, 99991111)   folic acid 1 MG tablet Commonly known as: FOLVITE Take 1 mg by mouth daily.   latanoprost 0.005 % ophthalmic solution Commonly known as: XALATAN Place 1 drop into both eyes at bedtime. (2100)  LORazepam 2 MG/ML concentrated solution Commonly known as: ATIVAN Take 0.5 mLs (1 mg total) by mouth every 4 (four) hours as needed for anxiety (agitation).   losartan 100 MG tablet Commonly known as: COZAAR Take 0.5 tablets (50 mg total) by mouth daily. (1000)   ondansetron 4 MG tablet Commonly known as: ZOFRAN Take 4 mg by mouth every 8 (eight) hours as needed for nausea or vomiting.   oxyCODONE 20 MG/ML concentrated solution Commonly known as: ROXICODONE INTENSOL Take 0.3 mLs (6 mg total) by mouth every 4 (four) hours as needed for moderate pain, severe pain or breakthrough pain (or dyspnea).    pantoprazole 40 MG tablet Commonly known as: PROTONIX Take 40 mg by mouth 2 (two) times daily. (0600, 1600)   polyethylene glycol powder 17 GM/SCOOP powder Commonly known as: GLYCOLAX/MIRALAX Take 1 cap full (17 g) with water by mouth daily. What changed:  when to take this additional instructions   tolterodine 2 MG tablet Commonly known as: DETROL Take 2 mg by mouth in the morning. (1000)   torsemide 10 MG tablet Commonly known as: DEMADEX Take 10 mg by mouth daily as needed (edema).   traMADol 50 MG tablet Commonly known as: ULTRAM Take 1 tablet (50 mg total) by mouth every 8 (eight) hours as needed for severe pain or moderate pain.   Vitamin D (Ergocalciferol) 1.25 MG (50000 UNIT) Caps capsule Commonly known as: DRISDOL Take 1 capsule (50,000 Units total) by mouth every 7 (seven) days. What changed:  when to take this additional instructions               Discharge Care Instructions  (From admission, onward)           Start     Ordered   09/13/22 0000  If the dressing is still on your incision site when you go home, remove it on the third day after your surgery date. Remove dressing if it begins to fall off, or if it is dirty or damaged before the third day.        09/13/22 1148   09/02/22 0000  Discharge wound care:       Comments: Per wound care instructions   09/02/22 0957            Contact information for follow-up providers     Paliwal, Himanshu, MD Follow up in 1 week(s).   Specialty: Family Medicine Contact information: [place] [address] [phone]              Contact information for after-discharge care     Destination     [facility] .   Service: Skilled Nursing Contact information: [address] [place] [phone]                    Discharge Exam: Filed Weights   08/31/22 0500 09/02/22 0350 09/02/22 1556  Weight: 84.5 kg  80.9 kg 81.7 kg   S: Alert and awake, no acute issues.  Comfort care.  BP (!) 172/89 (BP Location: Right Arm) Comment: recheck  Pulse 97   Temp 97.8 F (36.6 C) (Oral)   Resp 16   Ht 5' 5"$  (1.651 m)   Wt 81.7 kg   SpO2 99%   BMI 29.97 kg/m    Physical Exam General: Alert and awake, NAD Cardiovascular: S1 S2 clear, RRR.  Respiratory: Diminished breath sound at the bases Gastrointestinal: Soft, nontender, nondistended, NBS Ext: trace pedal edema  bilaterally Neuro: no new deficits Psych: flat affect   Condition at discharge: poor  The results of significant diagnostics from this hospitalization (including imaging, microbiology, ancillary and laboratory) are listed below for reference.   Imaging Studies: DG CHEST PORT 1 VIEW  Result Date: 09/05/2022 CLINICAL DATA:  Respiratory insufficiency and effusion EXAM: PORTABLE CHEST 1 VIEW COMPARISON:  09/03/2022 FINDINGS: Left-sided PICC line tip at high right atrium or superior caval/atrial junction. Patient rotated left. Mild cardiomegaly. Persistent white out of the left hemithorax. No right-sided pleural effusion. No pneumothorax. The right lung is clear. IMPRESSION: Persistent left hemithorax opacification, likely secondary to a combination of large left pleural effusion and underlying left lung collapse and/or consolidation. Electronically Signed   By: Abigail Miyamoto M.D.   On: 09/05/2022 08:09   DG CHEST PORT 1 VIEW  Result Date: 09/03/2022 CLINICAL DATA:  Hypoxia EXAM: PORTABLE CHEST 1 VIEW COMPARISON:  09/02/2022 FINDINGS: Complete opacification of the left hemithorax compatible with large left effusion and left lung collapse/consolidation. Stable right lung aeration. The cardiac silhouette is obscured. Left extremity PICC line tip SVC RA junction. Postop changes in the epigastric region. IMPRESSION: Complete opacification of the left hemithorax compatible with large left effusion and left lung collapse/consolidation. Electronically  Signed   By: Jerilynn Mages.  Shick M.D.   On: 09/03/2022 08:08   DG CHEST PORT 1 VIEW  Result Date: 09/02/2022 CLINICAL DATA:  Difficulty breathing.  Dyspnea. EXAM: PORTABLE CHEST 1 VIEW COMPARISON:  Radiographs 08/27/2022, 08/25/2022 and 08/23/2022. CT 08/13/2022. FINDINGS: 1201 hours. Left arm PICC projects to the superior cavoatrial junction. The heart size and mediastinal contours are stable. There is aortic atherosclerosis. There is progressive left lower lobe consolidation with progressive volume loss in the left hemithorax. The right lung remains clear. No evidence of pneumothorax or edema. There may be a small amount of pleural fluid on the left. IMPRESSION: Progressive left lower lobe consolidation with progressive volume loss in the left hemithorax. Findings remain most consistent with progressive left lower lobe pneumonia based on previous CT. Correlate clinically. Continued radiographic follow-up recommended. Electronically Signed   By: Richardean Sale M.D.   On: 09/02/2022 12:23   DG CHEST PORT 1 VIEW  Result Date: 08/27/2022 CLINICAL DATA:  Provided history: Acute respiratory failure with hypoxia. EXAM: PORTABLE CHEST 1 VIEW COMPARISON:  Prior chest radiographs 08/25/2022 and earlier. FINDINGS: Previously demonstrated ET and enteric tubes no longer appreciated. Left-sided PICC with tip at the level of the lower SVC. The cardiomediastinal silhouette is unchanged. Aortic atherosclerosis. The patient's chin obscures significant portions of the right lung apex. Left pleural effusion with underlying left basilar atelectasis and/or consolidation, similar to the prior chest radiograph of 08/25/2022. Background prominence of the interstitial lung markings bilaterally, suspicious for interstitial edema. No evidence of pneumothorax. IMPRESSION: 1. Please note, the patient's chin obscures significant portions of the right lung apex. 2. Left pleural effusion with underlying left basilar atelectasis and/or  consolidation, similar to the prior chest radiograph of 08/25/2022. 3. New from the prior exam, there is prominence of the interstitial lung markings, suspicious for interstitial edema. 4. Aortic Atherosclerosis (ICD10-I70.0). Electronically Signed   By: Kellie Simmering D.O.   On: 08/27/2022 08:20   [name] EKG SITE RITE  Result Date: 08/26/2022 If Site Rite image not attached, placement could not be confirmed due to current cardiac rhythm.  DG Chest Port 1 View  Result Date: 08/25/2022 CLINICAL DATA:  Acute respiratory failure. EXAM: PORTABLE CHEST 1 VIEW COMPARISON:  Chest radiograph 08/23/2022 FINDINGS:  ET tube midtrachea. Enteric tube courses inferior to the diaphragm. Monitoring leads overlie the patient. Stable cardiac and mediastinal contours. Layering left effusion with underlying consolidation. Right lung is clear. No pneumothorax. IMPRESSION: Layering left effusion with underlying consolidation. Electronically Signed   By: Lovey Newcomer M.D.   On: 08/25/2022 09:43   DG Chest Port 1 View  Result Date: 08/23/2022 CLINICAL DATA:  Acute respiratory failure EXAM: PORTABLE CHEST 1 VIEW COMPARISON:  08/22/2022 FINDINGS: Endotracheal tube with the tip 4.3 cm above the carina. Nasogastric tube with the tip projecting over the stomach. Small left pleural effusion. Left lower lobe airspace disease which may reflect atelectasis versus pneumonia. No pneumothorax. Heart and mediastinal contours are unremarkable. Moderate-sized hiatal hernia. No acute osseous abnormality. IMPRESSION: 1. Support lines and tubing in satisfactory position. 2. Small left pleural effusion. Left lower lobe airspace disease which may reflect atelectasis versus pneumonia. Electronically Signed   By: Kathreen Devoid M.D.   On: 08/23/2022 08:13   Portable Chest xray  Result Date: 08/22/2022 CLINICAL DATA:  Intubated EXAM: PORTABLE CHEST 1 VIEW COMPARISON:  Chest radiograph from one day prior. FINDINGS: Endotracheal tube tip is 1.8 cm above the  carina. Enteric tube enters stomach with the tip not seen on this image. Stable cardiomediastinal silhouette with normal heart size. No pneumothorax. Small left pleural effusion, probably stable accounting for differences in technique. No overt pulmonary edema. Similar left retrocardiac opacity. IMPRESSION: 1. Endotracheal tube tip is 1.8 cm above the carina, consider retracting 1 cm. 2. Small left pleural effusion, probably stable accounting for differences in technique. 3. Similar left retrocardiac opacity, favor left lower lobe atelectasis. Electronically Signed   By: Ilona Sorrel M.D.   On: 08/22/2022 08:20   DG Abd 1 View  Result Date: 08/21/2022 CLINICAL DATA:  [id number] Encounter for nasogastric (NG) tube placement [id number] EXAM: ABDOMEN - 1 VIEW COMPARISON:  X-ray abdomen 05/20/2022 FINDINGS: Enteric tube courses below the hemidiaphragm with tip and side port overlying the expected region of the gastric lumen. The bowel gas pattern is normal. Surgical tacks overlie the mid abdomen consistent with hernia repair. Right upper quadrant surgical clips. No radio-opaque calculi or other significant radiographic abnormality are seen. IMPRESSION: 1. Enteric tube in good position. 2. Nonobstructive bowel gas pattern. Electronically Signed   By: Iven Finn M.D.   On: 08/21/2022 17:06   Portable Chest x-ray  Result Date: 08/21/2022 CLINICAL DATA:  Intubated EXAM: PORTABLE CHEST 1 VIEW COMPARISON:  08/20/2022 FINDINGS: Single frontal view of the chest demonstrates endotracheal tube overlying tracheal air column tip midway between thoracic inlet and carina. Marked improved aeration in the left chest since prior study. Residual density at the left lung base consistent with residual lung consolidation and likely small effusion. The right chest is clear. No pneumothorax. No acute bony abnormalities. IMPRESSION: 1. No complication after intubation. 2. Interval improvement in aeration of the left lung, with persistent  left basilar consolidation and small left effusion. Electronically Signed   By: Randa Ngo M.D.   On: 08/21/2022 16:04   DG CHEST PORT 1 VIEW  Result Date: 08/20/2022 CLINICAL DATA:  Pneumonia.  COVID-19. EXAM: PORTABLE CHEST 1 VIEW COMPARISON:  August 19, 2022 FINDINGS: The left hemithorax is completely opacified. The tiny amount of aerated lung in the left upper lobe on the previous study is now opacified. The cardiomediastinal silhouette is not well assessed due to left lung opacification. No obvious abnormality. No pneumothorax on the right. No focal infiltrate on the right. No  overt edema. IMPRESSION: The left hemithorax is completely opacified. The tiny amount of aerated lung in the left upper lobe on the previous study is now opacified. The findings are concerning for a large pleural effusion with underlying atelectasis or infiltrate. Electronically Signed   By: Dorise Bullion III M.D.   On: 08/20/2022 07:56   DG CHEST PORT 1 VIEW  Result Date: 08/19/2022 CLINICAL DATA:  Collapse of left lung. EXAM: PORTABLE CHEST 1 VIEW COMPARISON:  Chest x-ray 08/19/2022. FINDINGS: Unchanged near complete opacification left hemithorax with volume loss. Right lung is clear. No visible pneumothorax. Polyarticular degenerative change. Cardiomediastinal silhouette is largely obscured. IMPRESSION: Unchanged near complete opacification left hemithorax with volume loss, further described on chest x-ray from yesterday. Recommend imaging follow-up to resolution. Electronically Signed   By: Margaretha Sheffield M.D.   On: 08/19/2022 08:25   DG Chest 2 View  Result Date: 08/18/2022 CLINICAL DATA:  Dyspnea, altered mental status, hypoxia, suspected sepsis EXAM: CHEST - 2 VIEW COMPARISON:  08/13/2022 chest radiograph. FINDINGS: Left rotated chest radiograph. New complete opacification of the left hemithorax with left mediastinal shift and volume loss in the left hemithorax. No pneumothorax. Clear right lung. No right  pleural effusion. IMPRESSION: New complete opacification of the left hemithorax with left mediastinal shift and volume loss in the left hemithorax. Given the rapid change since 08/13/2022 chest radiograph, findings most likely represent mucous plugging in the central left lung airways with complete left lung atelectasis, with underlying pneumonia not excluded. Chest radiograph follow-up advised at minimum. Electronically Signed   By: Ilona Sorrel M.D.   On: 08/18/2022 18:45    Microbiology: Results for orders placed or performed during the hospital encounter of 08/18/22  Culture, blood (Routine x 2)     Status: None   Collection Time: 08/18/22  8:25 PM   Specimen: BLOOD  Result Value Ref Range Status   Specimen Description   Final    BLOOD BLOOD RIGHT ARM Performed at [facility] [address] [place], [place] [address]    Special Requests   Final    BOTTLES DRAWN AEROBIC AND ANAEROBIC Blood Culture adequate volume Performed at [facility] [address], [place], [place] [address]    Culture   Final    NO GROWTH 5 DAYS Performed at [facility], [place] [address], [place], [place] [address]    Report Status 08/24/2022 FINAL  Final  Respiratory (~20 pathogens) panel by PCR     Status: None   Collection Time: 08/19/22  4:02 PM   Specimen: Nasopharyngeal Swab; Respiratory  Result Value Ref Range Status   Adenovirus NOT DETECTED NOT DETECTED Final   Coronavirus 229E NOT DETECTED NOT DETECTED Final    Comment: (NOTE) The Coronavirus on the Respiratory Panel, DOES NOT test for the novel  Coronavirus (2019 nCoV)    Coronavirus HKU1 NOT DETECTED NOT DETECTED Final   Coronavirus NL63 NOT DETECTED NOT DETECTED Final   Coronavirus OC43 NOT DETECTED NOT DETECTED Final   Metapneumovirus NOT DETECTED NOT DETECTED Final   Rhinovirus / Enterovirus NOT DETECTED NOT DETECTED Final   Influenza A NOT DETECTED NOT DETECTED Final   Influenza B NOT  DETECTED NOT DETECTED Final   Parainfluenza Virus 1 NOT DETECTED NOT DETECTED Final   Parainfluenza Virus 2 NOT DETECTED NOT DETECTED Final   Parainfluenza Virus 3 NOT DETECTED NOT DETECTED Final   Parainfluenza Virus 4 NOT DETECTED NOT DETECTED Final   Respiratory Syncytial Virus  NOT DETECTED NOT DETECTED Final   Bordetella pertussis NOT DETECTED NOT DETECTED Final   Bordetella Parapertussis NOT DETECTED NOT DETECTED Final   Chlamydophila pneumoniae NOT DETECTED NOT DETECTED Final   Mycoplasma pneumoniae NOT DETECTED NOT DETECTED Final    Comment: Performed at [facility], [place] [address] [place], [place] [address]  MRSA Next Gen by PCR, Nasal     Status: Abnormal   Collection Time: 08/19/22  4:46 PM   Specimen: Nasal Swab  Result Value Ref Range Status   MRSA by PCR Next Gen DETECTED (A) NOT DETECTED Final    Comment: RESULT CALLED TO, READ BACK BY AND VERIFIED WITH: RN CAESAR AT 2104 08/19/22 CRUICKSHANK A (NOTE) The GeneXpert MRSA Assay (FDA approved for NASAL specimens only), is one component of a comprehensive MRSA colonization surveillance program. It is not intended to diagnose MRSA infection nor to guide or monitor treatment for MRSA infections. Test performance is not FDA approved in patients less than 46 years old. Performed at [facility], [place] [address] [place], [place] [address]   Fungus Culture With Stain     Status: Abnormal   Collection Time: 08/21/22  1:40 PM   Specimen: Bronchial Alveolar Lavage; Respiratory  Result Value Ref Range Status   Fungus Stain Final report  Final   Fungus (Mycology) Culture Preliminary report (A)  Final    Comment: (NOTE) Performed At: [facility] [place], [place] [id number] Rush Farmer MD [id number]    Fungal Source BRONCHIAL ALVEOLAR LAVAGE  Final    Comment: Performed at [facility] [address], [place], [place] [address]  Culture, Respiratory w  Gram Stain     Status: None   Collection Time: 08/21/22  1:40 PM   Specimen: Bronchial Alveolar Lavage; Respiratory  Result Value Ref Range Status   Specimen Description   Final    BRONCHIAL ALVEOLAR LAVAGE Performed at [facility] [address], [place], [place] [address]    Special Requests   Final    NONE Performed at [facility], [place] [address] [place], [place] [address]    Gram Stain   Final    FEW WBC PRESENT, PREDOMINANTLY PMN NO ORGANISMS SEEN Performed at [facility], [place] [address] [place], [place] [address]    Culture RARE CANDIDA GUILLIERMONDII  Final   Report Status 08/25/2022 FINAL  Final  Acid Fast Smear (AFB)     Status: None   Collection Time: 08/21/22  1:40 PM   Specimen: Bronchial Alveolar Lavage; Respiratory  Result Value Ref Range Status   AFB Specimen Processing Concentration  Final   Acid Fast Smear Negative  Final    Comment: (NOTE) Performed At: [facility] [place], [place] [id number] Rush Farmer MD [id number]    Source (AFB) BRONCHIAL ALVEOLAR LAVAGE  Final    Comment: Performed at [facility] [address], [place], [place] [address]  Anaerobic culture w Gram Stain     Status: None   Collection Time: 08/21/22  1:40 PM   Specimen: Bronchial Alveolar Lavage; Respiratory  Result Value Ref Range Status   Specimen Description   Final    BRONCHIAL ALVEOLAR LAVAGE Performed at [facility] [address] [place], [place] [address]    Special Requests   Final    NONE Performed at [facility], [place] [address] [place],  [address]  Culture   Final    NO ANAEROBES ISOLATED Performed at [facility], [place] [address], [place], [place] [address]    Report Status 08/26/2022 FINAL  Final  Fungus Culture Result     Status: None   Collection Time: 08/21/22  1:40 PM  Result Value Ref Range Status    Result 1 Comment  Final    Comment: (NOTE) KOH/Calcofluor preparation:  no fungus observed. Performed At: [facility] [place], [place] [id number] Rush Farmer MD [id number]   Fungal organism reflex     Status: Abnormal   Collection Time: 08/21/22  1:40 PM  Result Value Ref Range Status   Fungal result 1 Comment (A)  Final    Comment: (NOTE) Candida guilliermondii Light growth Performed At: [facility] [place], [place] [id number] Rush Farmer MD [id number]     Labs: CBC: No results for input(s): "WBC", "NEUTROABS", "HGB", "HCT", "MCV", "PLT" in the last 168 hours. Basic Metabolic Panel: No results for input(s): "NA", "K", "CL", "CO2", "GLUCOSE", "BUN", "CREATININE", "CALCIUM", "MG", "PHOS" in the last 168 hours. Liver Function Tests: No results for input(s): "AST", "ALT", "ALKPHOS", "BILITOT", "PROT", "ALBUMIN" in the last 168 hours. CBG: No results for input(s): "GLUCAP" in the last 168 hours.  Discharge time spent: greater than 30 minutes.  Signed: Estill Cotta, MD [facility] 09/13/2022

## 2022-09-13 NOTE — Plan of Care (Signed)
  Problem: Pain Managment: Goal: General experience of comfort will improve Outcome: Progressing   

## 2022-09-13 NOTE — Progress Notes (Signed)
Attempted to call report to [facility], No response. Will try again later.

## 2022-09-13 NOTE — Progress Notes (Signed)
Attempted to call report again to [facility]. No response.

## 2022-09-13 NOTE — TOC Progression Note (Signed)
Transition of Care [facility]) - Progression Note    Patient Details  Name: Stephanie Benton MRN: [id number] Date of Birth: May 26, 1951  Transition of Care [facility]) CM/SW Contact  Joaquin Courts, RN Phone Number: 09/13/2022, 11:40 AM  Clinical Narrative:     CM received notification from [facility] that facility is rescinding their bed offer at this time.  [facility] reports their offer is still active.  Spoke with son and updated that [facility] cannot accept patient, son in agreement with [facility] placement.  Facility notified that bed offer is accepted, patient will dc to room 304A, report number [phone].  Patient to transport by [facility] at time of dc, son requests that he is notified when ems is picking up patient so he can met her at facility.   Expected Discharge Plan: [place] Barriers to Discharge: Continued Medical Work up  Expected Discharge Plan and Services In-house Referral: NA Discharge Planning Services: CM Consult Post Acute Care Choice: [place] Living arrangements for the past 2 months: [place] ([place]) Expected Discharge Date: 09/02/22               DME Arranged: N/A DME Agency: NA       HH Arranged: NA HH Agency: NA         Social Determinants of Health (SDOH) Interventions SDOH Screenings   Food Insecurity: No Food Insecurity (08/19/2022)  Housing: Low Risk  (08/19/2022)  Recent Concern: Housing - Medium Risk (06/21/2022)  Transportation Needs: No Transportation Needs (08/19/2022)  Recent Concern: Transportation Needs - Unmet Transportation Needs (06/21/2022)  Utilities: Not At Risk (08/19/2022)  Tobacco Use: High Risk (08/25/2022)    Readmission Risk Interventions    09/02/2022   11:01 AM 07/22/2022   11:58 AM 07/18/2022    8:43 AM  Readmission Risk Prevention Plan  Transportation Screening Complete  Complete  Medication Review (RN Care Manager) Complete  Complete  PCP or  Specialist appointment within 3-5 days of discharge Complete  Complete  HRI or Home Care Consult Complete  Complete  SW Recovery Care/Counseling Consult Complete  Complete  Palliative Care Screening   Not [place] Complete Complete Not Applicable

## 2022-09-13 NOTE — Progress Notes (Signed)
The patient is alert and has been seen by her physician. The orders for discharge were written. PICC line has been removed by IV team. Patient's son has been notified that [facility] is here to pick up patient. Discharge instructions printed off and placed and AVS packet. She is being discharged via [facility] to [facility] with all of her belongings.

## 2022-09-26 LAB — FUNGUS CULTURE RESULT

## 2022-09-26 LAB — FUNGUS CULTURE WITH STAIN

## 2022-09-26 LAB — FUNGAL ORGANISM REFLEX

## 2022-10-05 LAB — ACID FAST CULTURE WITH REFLEXED SENSITIVITIES (MYCOBACTERIA): Acid Fast Culture: NEGATIVE

## 2022-11-04 DEATH — deceased
